# Patient Record
Sex: Male | Born: 1935
Health system: Southern US, Community
[De-identification: ages and names within clinical notes are randomized; demographics above are authoritative.]

## PROBLEM LIST (undated history)

## (undated) DIAGNOSIS — I739 Peripheral vascular disease, unspecified: Secondary | ICD-10-CM

## (undated) DIAGNOSIS — E079 Disorder of thyroid, unspecified: Secondary | ICD-10-CM

## (undated) DIAGNOSIS — K219 Gastro-esophageal reflux disease without esophagitis: Secondary | ICD-10-CM

## (undated) DIAGNOSIS — Z8619 Personal history of other infectious and parasitic diseases: Secondary | ICD-10-CM

## (undated) DIAGNOSIS — I509 Heart failure, unspecified: Secondary | ICD-10-CM

## (undated) DIAGNOSIS — Z7901 Long term (current) use of anticoagulants: Secondary | ICD-10-CM

## (undated) DIAGNOSIS — I2129 ST elevation (STEMI) myocardial infarction involving other sites: Secondary | ICD-10-CM

## (undated) DIAGNOSIS — I2781 Cor pulmonale (chronic): Secondary | ICD-10-CM

## (undated) DIAGNOSIS — I4891 Unspecified atrial fibrillation: Secondary | ICD-10-CM

## (undated) DIAGNOSIS — E538 Deficiency of other specified B group vitamins: Secondary | ICD-10-CM

## (undated) DIAGNOSIS — I272 Pulmonary hypertension, unspecified: Secondary | ICD-10-CM

## (undated) DIAGNOSIS — I2510 Atherosclerotic heart disease of native coronary artery without angina pectoris: Secondary | ICD-10-CM

## (undated) DIAGNOSIS — E039 Hypothyroidism, unspecified: Secondary | ICD-10-CM

## (undated) DIAGNOSIS — D509 Iron deficiency anemia, unspecified: Secondary | ICD-10-CM

## (undated) DIAGNOSIS — I700 Atherosclerosis of aorta: Secondary | ICD-10-CM

## (undated) DIAGNOSIS — E785 Hyperlipidemia, unspecified: Secondary | ICD-10-CM

## (undated) DIAGNOSIS — Z8489 Family history of other specified conditions: Secondary | ICD-10-CM

## (undated) HISTORY — DX: Personal history of other infectious and parasitic diseases: Z86.19

## (undated) HISTORY — PX: COLONOSCOPY: SHX174

## (undated) HISTORY — PX: CARDIAC SURGERY: SHX584

## (undated) HISTORY — PX: HERNIA REPAIR: SHX51

---

## 1993-02-21 DIAGNOSIS — I2510 Atherosclerotic heart disease of native coronary artery without angina pectoris: Secondary | ICD-10-CM

## 1993-02-21 DIAGNOSIS — I2129 ST elevation (STEMI) myocardial infarction involving other sites: Secondary | ICD-10-CM

## 1993-02-21 HISTORY — DX: Atherosclerotic heart disease of native coronary artery without angina pectoris: I25.10

## 1993-02-21 HISTORY — DX: ST elevation (STEMI) myocardial infarction involving other sites: I21.29

## 1993-02-26 DIAGNOSIS — Z951 Presence of aortocoronary bypass graft: Secondary | ICD-10-CM

## 1993-02-26 HISTORY — DX: Presence of aortocoronary bypass graft: Z95.1

## 1993-02-26 HISTORY — PX: CORONARY ARTERY BYPASS GRAFT: SHX141

## 2003-12-30 ENCOUNTER — Other Ambulatory Visit: Payer: Self-pay

## 2006-08-19 ENCOUNTER — Ambulatory Visit: Payer: Self-pay | Admitting: Gastroenterology

## 2006-09-22 ENCOUNTER — Emergency Department: Payer: Self-pay | Admitting: Emergency Medicine

## 2006-10-11 ENCOUNTER — Other Ambulatory Visit: Payer: Self-pay

## 2006-10-11 ENCOUNTER — Inpatient Hospital Stay: Payer: Self-pay

## 2007-07-22 ENCOUNTER — Other Ambulatory Visit: Payer: Self-pay

## 2007-07-22 ENCOUNTER — Emergency Department: Payer: Self-pay

## 2008-09-02 ENCOUNTER — Ambulatory Visit: Payer: Self-pay

## 2008-09-28 ENCOUNTER — Ambulatory Visit: Payer: Self-pay | Admitting: Unknown Physician Specialty

## 2008-10-03 ENCOUNTER — Ambulatory Visit: Payer: Self-pay | Admitting: Unknown Physician Specialty

## 2009-06-20 ENCOUNTER — Ambulatory Visit: Payer: Self-pay | Admitting: Unknown Physician Specialty

## 2013-11-06 ENCOUNTER — Emergency Department: Payer: Self-pay | Admitting: Emergency Medicine

## 2013-11-06 LAB — COMPREHENSIVE METABOLIC PANEL
Anion Gap: 6 — ABNORMAL LOW (ref 7–16)
BUN: 24 mg/dL — ABNORMAL HIGH (ref 7–18)
Bilirubin,Total: 1.2 mg/dL — ABNORMAL HIGH (ref 0.2–1.0)
Creatinine: 1.07 mg/dL (ref 0.60–1.30)
EGFR (African American): >60
EGFR (Non-African Amer.): >60
Glucose: 113 mg/dL — ABNORMAL HIGH (ref 65–99)
Osmolality: 279 (ref 275–301)
Potassium: 3.9 mmol/L (ref 3.5–5.1)
Sodium: 137 mmol/L (ref 136–145)

## 2013-11-06 LAB — CBC
HGB: 13.1 g/dL (ref 13.0–18.0)
MCH: 36.8 pg — ABNORMAL HIGH (ref 26.0–34.0)
MCHC: 35.3 g/dL (ref 32.0–36.0)
MCV: 104 fL — ABNORMAL HIGH (ref 80–100)
RBC: 3.55 10*6/uL — ABNORMAL LOW (ref 4.40–5.90)
RDW: 19.2 % — ABNORMAL HIGH (ref 11.5–14.5)

## 2013-11-06 LAB — PROTIME-INR
INR: 1.8
Prothrombin Time: 20.8 secs — ABNORMAL HIGH (ref 11.5–14.7)

## 2013-11-06 LAB — URINALYSIS, COMPLETE
Bilirubin,UR: NEGATIVE
Nitrite: NEGATIVE
Ph: 5 (ref 4.5–8.0)
RBC,UR: 1 /HPF (ref 0–5)

## 2014-04-25 DIAGNOSIS — I10 Essential (primary) hypertension: Secondary | ICD-10-CM | POA: Insufficient documentation

## 2014-04-25 DIAGNOSIS — K219 Gastro-esophageal reflux disease without esophagitis: Secondary | ICD-10-CM | POA: Insufficient documentation

## 2014-04-25 DIAGNOSIS — I2510 Atherosclerotic heart disease of native coronary artery without angina pectoris: Secondary | ICD-10-CM | POA: Insufficient documentation

## 2014-04-25 HISTORY — DX: Essential (primary) hypertension: I10

## 2014-05-25 ENCOUNTER — Encounter: Payer: Self-pay | Admitting: Podiatrist

## 2014-05-25 ENCOUNTER — Ambulatory Visit (INDEPENDENT_AMBULATORY_CARE_PROVIDER_SITE_OTHER): Payer: Commercial Managed Care - HMO | Admitting: Podiatrist

## 2014-05-25 ENCOUNTER — Ambulatory Visit (INDEPENDENT_AMBULATORY_CARE_PROVIDER_SITE_OTHER): Payer: Commercial Managed Care - HMO

## 2014-05-25 VITALS — BP 123/73 | HR 68 | Resp 16 | Ht 68.0 in | Wt 175.0 lb

## 2014-05-25 DIAGNOSIS — M2040 Other hammer toe(s) (acquired), unspecified foot: Secondary | ICD-10-CM

## 2014-05-25 DIAGNOSIS — L84 Corns and callosities: Secondary | ICD-10-CM

## 2014-05-25 NOTE — Patient Instructions (Signed)

## 2014-05-25 NOTE — Progress Notes (Signed)
   Subjective:    Patient ID: [name] [name], male    DOB: 1929-02-07, 80 y.o.   MRN: [id number]  HPI Comments: i have a corn on my 4 and 5th toes on my left foot. They hurt sometimes. i have had them for 1 - 2 yrs. They are getting worse. My sister in law trims on my corns. i will use cream on them.  Foot Pain      Review of Systems  Hematological: Bruises/bleeds easily.       Slow to heal  All other systems reviewed and are negative.      Objective:   Physical Exam Patient is awake, alert, and oriented x 3.  In no acute distress.  Vascular status is intact with palpable pedal pulses at 2/4 DP and PT bilateral and capillary refill time within normal limits, multiple varicosities noted to bilateral feet,. Neurological sensation is also intact bilaterally via Semmes Weinstein monofilament at 5/5 sites. Light touch, vibratory sensation, Achilles tendon reflex is intact..  Musculature intact with dorsiflexion, plantarflexion, inversion, eversion. Contracture of lesser digits is noted bilateral. Hallux abductovalgus deformity left and right. Adductovarus deformity of digits 4 and 5 left also present.  He has intractable porokeratotic lesions on the medial aspect of the left fifth digital nail and medial aspect of the left fourth toe no sign of infection is present. Ground glass appearance is noted with deeply enucleated lesions. No other areas of concern dermatologically are noted.     Assessment & Plan:  Hammertoe with corn x2 left foot  Plan: Thorough debridement of the corns was carried out today without complication and without anesthesia. We discussed that if they come back in the future we can consider excision of the lesions. He will call of any problems or concerns arise. He was also dispensed padding and given instructions for its use.

## 2014-10-27 DIAGNOSIS — E538 Deficiency of other specified B group vitamins: Secondary | ICD-10-CM | POA: Insufficient documentation

## 2015-01-16 DIAGNOSIS — L03113 Cellulitis of right upper limb: Secondary | ICD-10-CM | POA: Diagnosis not present

## 2015-01-16 DIAGNOSIS — I4891 Unspecified atrial fibrillation: Secondary | ICD-10-CM | POA: Diagnosis not present

## 2015-01-16 DIAGNOSIS — M7021 Olecranon bursitis, right elbow: Secondary | ICD-10-CM | POA: Diagnosis not present

## 2015-01-25 DIAGNOSIS — M71021 Abscess of bursa, right elbow: Secondary | ICD-10-CM | POA: Diagnosis not present

## 2015-01-25 DIAGNOSIS — L03113 Cellulitis of right upper limb: Secondary | ICD-10-CM | POA: Diagnosis not present

## 2015-02-04 ENCOUNTER — Emergency Department: Payer: Self-pay | Admitting: Emergency Medicine

## 2015-02-04 DIAGNOSIS — I10 Essential (primary) hypertension: Secondary | ICD-10-CM | POA: Diagnosis not present

## 2015-02-04 DIAGNOSIS — M7021 Olecranon bursitis, right elbow: Secondary | ICD-10-CM | POA: Diagnosis not present

## 2015-02-04 LAB — COMPREHENSIVE METABOLIC PANEL
ANION GAP: 5 — AB (ref 7–16)
Albumin: 3.5 g/dL (ref 3.4–5.0)
Alkaline Phosphatase: 79 U/L (ref 46–116)
BILIRUBIN TOTAL: 0.6 mg/dL (ref 0.2–1.0)
BUN: 19 mg/dL — ABNORMAL HIGH (ref 7–18)
CHLORIDE: 106 mmol/L (ref 98–107)
CREATININE: 1.26 mg/dL (ref 0.60–1.30)
Calcium, Total: 8.5 mg/dL (ref 8.5–10.1)
Co2: 30 mmol/L (ref 21–32)
GFR CALC NON AF AMER: 58 — AB
Glucose: 122 mg/dL — ABNORMAL HIGH (ref 65–99)
Osmolality: 285 (ref 275–301)
POTASSIUM: 4.2 mmol/L (ref 3.5–5.1)
SGOT(AST): 28 U/L (ref 15–37)
SGPT (ALT): 20 U/L (ref 14–63)
Sodium: 141 mmol/L (ref 136–145)
Total Protein: 7.2 g/dL (ref 6.4–8.2)

## 2015-02-04 LAB — PROTIME-INR
INR: 1.4
PROTHROMBIN TIME: 17.3 s — AB

## 2015-02-04 LAB — CBC
HCT: 35.7 % — ABNORMAL LOW (ref 40.0–52.0)
HGB: 11.8 g/dL — ABNORMAL LOW (ref 13.0–18.0)
MCH: 35.8 pg — ABNORMAL HIGH (ref 26.0–34.0)
MCHC: 33.1 g/dL (ref 32.0–36.0)
MCV: 108 fL — AB (ref 80–100)
Platelet: 167 10*3/uL (ref 150–440)
RBC: 3.3 10*6/uL — ABNORMAL LOW (ref 4.40–5.90)
RDW: 20 % — AB (ref 11.5–14.5)
WBC: 9.7 10*3/uL (ref 3.8–10.6)

## 2015-02-05 DIAGNOSIS — B9689 Other specified bacterial agents as the cause of diseases classified elsewhere: Secondary | ICD-10-CM | POA: Diagnosis not present

## 2015-02-05 DIAGNOSIS — M7021 Olecranon bursitis, right elbow: Secondary | ICD-10-CM | POA: Diagnosis not present

## 2015-02-05 DIAGNOSIS — M25521 Pain in right elbow: Secondary | ICD-10-CM | POA: Diagnosis not present

## 2015-02-06 ENCOUNTER — Ambulatory Visit: Payer: Self-pay | Admitting: Surgery

## 2015-02-06 DIAGNOSIS — M7022 Olecranon bursitis, left elbow: Secondary | ICD-10-CM | POA: Diagnosis not present

## 2015-02-06 DIAGNOSIS — I2510 Atherosclerotic heart disease of native coronary artery without angina pectoris: Secondary | ICD-10-CM | POA: Diagnosis not present

## 2015-02-06 DIAGNOSIS — M7021 Olecranon bursitis, right elbow: Secondary | ICD-10-CM | POA: Diagnosis not present

## 2015-02-06 DIAGNOSIS — B9689 Other specified bacterial agents as the cause of diseases classified elsewhere: Secondary | ICD-10-CM | POA: Diagnosis not present

## 2015-02-06 DIAGNOSIS — I10 Essential (primary) hypertension: Secondary | ICD-10-CM | POA: Diagnosis not present

## 2015-02-06 DIAGNOSIS — K219 Gastro-esophageal reflux disease without esophagitis: Secondary | ICD-10-CM | POA: Diagnosis not present

## 2015-02-06 DIAGNOSIS — I480 Paroxysmal atrial fibrillation: Secondary | ICD-10-CM | POA: Diagnosis not present

## 2015-02-06 DIAGNOSIS — Z885 Allergy status to narcotic agent status: Secondary | ICD-10-CM | POA: Diagnosis not present

## 2015-02-06 DIAGNOSIS — M00821 Arthritis due to other bacteria, right elbow: Secondary | ICD-10-CM | POA: Diagnosis not present

## 2015-02-20 DIAGNOSIS — I4891 Unspecified atrial fibrillation: Secondary | ICD-10-CM | POA: Diagnosis not present

## 2015-03-21 DIAGNOSIS — I4891 Unspecified atrial fibrillation: Secondary | ICD-10-CM | POA: Diagnosis not present

## 2015-04-18 DIAGNOSIS — I4891 Unspecified atrial fibrillation: Secondary | ICD-10-CM | POA: Diagnosis not present

## 2015-04-24 DIAGNOSIS — E039 Hypothyroidism, unspecified: Secondary | ICD-10-CM | POA: Diagnosis not present

## 2015-04-24 DIAGNOSIS — I482 Chronic atrial fibrillation: Secondary | ICD-10-CM | POA: Diagnosis not present

## 2015-04-24 DIAGNOSIS — E119 Type 2 diabetes mellitus without complications: Secondary | ICD-10-CM | POA: Diagnosis not present

## 2015-04-24 DIAGNOSIS — E538 Deficiency of other specified B group vitamins: Secondary | ICD-10-CM | POA: Diagnosis not present

## 2015-04-24 DIAGNOSIS — I25810 Atherosclerosis of coronary artery bypass graft(s) without angina pectoris: Secondary | ICD-10-CM | POA: Diagnosis not present

## 2015-04-29 NOTE — Op Note (Signed)
PATIENT NAME:  [name] [name], [name] [name] MR#:  [id number] DATE OF BIRTH:  [birth year]  DATE OF PROCEDURE:  02/06/2015  PREOPERATIVE DIAGNOSIS: Septic olecranon bursa, right elbow.   POSTOPERATIVE DIAGNOSIS: Septic olecranon bursa, right elbow.   PROCEDURE: Excision of septic olecranon bursa, right elbow.   SURGEON: Pascal Lux, MD   ANESTHESIA: General LMA.   FINDINGS: As noted above.   COMPLICATIONS: None.   ESTIMATED BLOOD LOSS: Minimal.   TOTAL FLUIDS: 600 mL of crystalloid.   TOURNIQUET TIME: 28 minutes at 250 mmHg.   DRAINS: Penrose x 1.  CLOSURE: 4-0 Prolene interrupted sutures.   BRIEF CLINICAL NOTE: The patient is an 82 year old male who sustained an injury to his right elbow approximately 2 months ago when he struck the back of it against a steel fence while pulling roots. Since that time, he has noted intermittent swelling in the elbow, for which he has seen his primary care provider. The primary care provider has drained it on several occasions. Recently, he noted increased swelling and redness. The primary care physician attempted to aspirate it without success. He presents at this time for definitive management of his swollen and erythematous right elbow.   PROCEDURE IN DETAIL: The patient was brought into the operating room and lain in the supine position. After adequate general laryngeal mask anesthesia was obtained, the patient's right upper extremity was prepped with ChloraPrep solution before being draped sterilely. Antibiotics were administered after cultures were obtained, intraoperatively. The arm was kept elevated for several minutes before the tourniquet was inflated to 250 mmHg. A curvilinear incision was made around the posterolateral aspect of the bursa and carried down to the subcutaneous tissues to enter the bursa. A culture was obtained. The bursa was ellipsed in its entirety using a #15-blade and rongeurs. Once the bursa was excised completely, the wound was  copiously irrigated with bacitracin saline solution before a 1/4-inch Penrose drain was placed. The subcutaneous tissues were closed using 2-0 Vicryl interrupted sutures before the skin was closed using 4-0 Prolene interrupted sutures. Care was taken to avoid suturing in the drain. A total of 10 mL of 0.5% Sensorcaine plain was injected in and around the incision site to help with postoperative analgesia before a sterile bulky dressing was applied to the elbow. The patient was placed into a sling before he was awakened, extubated, and returned to the recovery room in satisfactory condition after tolerating the procedure well.   ____________________________ J. Dorien Chihuahua, MD jjp:mw D: 02/06/2015 15:52:02 ET T: 02/06/2015 19:54:49 ET JOB#: [id number]  cc: Pascal Lux, MD, <Dictator> Pascal Lux MD ELECTRONICALLY SIGNED 02/08/2015 11:28

## 2015-05-01 DIAGNOSIS — L570 Actinic keratosis: Secondary | ICD-10-CM | POA: Diagnosis not present

## 2015-05-01 DIAGNOSIS — E538 Deficiency of other specified B group vitamins: Secondary | ICD-10-CM | POA: Diagnosis not present

## 2015-05-01 DIAGNOSIS — E119 Type 2 diabetes mellitus without complications: Secondary | ICD-10-CM | POA: Diagnosis not present

## 2015-05-01 DIAGNOSIS — I482 Chronic atrial fibrillation: Secondary | ICD-10-CM | POA: Diagnosis not present

## 2015-05-01 DIAGNOSIS — Z0000 Encounter for general adult medical examination without abnormal findings: Secondary | ICD-10-CM | POA: Diagnosis not present

## 2015-06-04 DIAGNOSIS — I482 Chronic atrial fibrillation: Secondary | ICD-10-CM | POA: Diagnosis not present

## 2015-06-12 DIAGNOSIS — I25810 Atherosclerosis of coronary artery bypass graft(s) without angina pectoris: Secondary | ICD-10-CM | POA: Diagnosis not present

## 2015-06-12 DIAGNOSIS — I10 Essential (primary) hypertension: Secondary | ICD-10-CM | POA: Diagnosis not present

## 2015-06-12 DIAGNOSIS — I482 Chronic atrial fibrillation: Secondary | ICD-10-CM | POA: Diagnosis not present

## 2015-06-12 DIAGNOSIS — I420 Dilated cardiomyopathy: Secondary | ICD-10-CM | POA: Diagnosis not present

## 2015-06-19 DIAGNOSIS — L821 Other seborrheic keratosis: Secondary | ICD-10-CM | POA: Diagnosis not present

## 2015-06-19 DIAGNOSIS — Z85828 Personal history of other malignant neoplasm of skin: Secondary | ICD-10-CM | POA: Diagnosis not present

## 2015-06-19 DIAGNOSIS — X32XXXA Exposure to sunlight, initial encounter: Secondary | ICD-10-CM | POA: Diagnosis not present

## 2015-06-19 DIAGNOSIS — L570 Actinic keratosis: Secondary | ICD-10-CM | POA: Diagnosis not present

## 2015-06-19 DIAGNOSIS — Z85820 Personal history of malignant melanoma of skin: Secondary | ICD-10-CM | POA: Diagnosis not present

## 2015-07-09 DIAGNOSIS — I482 Chronic atrial fibrillation: Secondary | ICD-10-CM | POA: Diagnosis not present

## 2015-07-31 DIAGNOSIS — L570 Actinic keratosis: Secondary | ICD-10-CM | POA: Diagnosis not present

## 2015-07-31 DIAGNOSIS — X32XXXA Exposure to sunlight, initial encounter: Secondary | ICD-10-CM | POA: Diagnosis not present

## 2015-08-06 DIAGNOSIS — I482 Chronic atrial fibrillation: Secondary | ICD-10-CM | POA: Diagnosis not present

## 2015-09-04 DIAGNOSIS — L570 Actinic keratosis: Secondary | ICD-10-CM | POA: Diagnosis not present

## 2015-09-04 DIAGNOSIS — X32XXXA Exposure to sunlight, initial encounter: Secondary | ICD-10-CM | POA: Diagnosis not present

## 2015-09-10 DIAGNOSIS — I482 Chronic atrial fibrillation: Secondary | ICD-10-CM | POA: Diagnosis not present

## 2015-10-08 DIAGNOSIS — I482 Chronic atrial fibrillation: Secondary | ICD-10-CM | POA: Diagnosis not present

## 2015-10-25 DIAGNOSIS — Z0000 Encounter for general adult medical examination without abnormal findings: Secondary | ICD-10-CM | POA: Diagnosis not present

## 2015-10-25 DIAGNOSIS — I482 Chronic atrial fibrillation: Secondary | ICD-10-CM | POA: Diagnosis not present

## 2015-10-25 DIAGNOSIS — E538 Deficiency of other specified B group vitamins: Secondary | ICD-10-CM | POA: Diagnosis not present

## 2015-10-25 DIAGNOSIS — E119 Type 2 diabetes mellitus without complications: Secondary | ICD-10-CM | POA: Diagnosis not present

## 2015-11-01 DIAGNOSIS — E7800 Pure hypercholesterolemia, unspecified: Secondary | ICD-10-CM | POA: Diagnosis not present

## 2015-11-01 DIAGNOSIS — I480 Paroxysmal atrial fibrillation: Secondary | ICD-10-CM | POA: Diagnosis not present

## 2015-11-01 DIAGNOSIS — E538 Deficiency of other specified B group vitamins: Secondary | ICD-10-CM | POA: Diagnosis not present

## 2015-11-01 DIAGNOSIS — E119 Type 2 diabetes mellitus without complications: Secondary | ICD-10-CM | POA: Diagnosis not present

## 2015-11-05 DIAGNOSIS — I482 Chronic atrial fibrillation: Secondary | ICD-10-CM | POA: Diagnosis not present

## 2015-11-06 DIAGNOSIS — J0110 Acute frontal sinusitis, unspecified: Secondary | ICD-10-CM | POA: Diagnosis not present

## 2015-11-06 DIAGNOSIS — L2084 Intrinsic (allergic) eczema: Secondary | ICD-10-CM | POA: Diagnosis not present

## 2015-11-19 DIAGNOSIS — I8311 Varicose veins of right lower extremity with inflammation: Secondary | ICD-10-CM | POA: Diagnosis not present

## 2015-11-19 DIAGNOSIS — L308 Other specified dermatitis: Secondary | ICD-10-CM | POA: Diagnosis not present

## 2015-11-19 DIAGNOSIS — I8312 Varicose veins of left lower extremity with inflammation: Secondary | ICD-10-CM | POA: Diagnosis not present

## 2015-12-03 DIAGNOSIS — I482 Chronic atrial fibrillation: Secondary | ICD-10-CM | POA: Diagnosis not present

## 2015-12-13 DIAGNOSIS — I10 Essential (primary) hypertension: Secondary | ICD-10-CM | POA: Diagnosis not present

## 2015-12-13 DIAGNOSIS — E119 Type 2 diabetes mellitus without complications: Secondary | ICD-10-CM | POA: Diagnosis not present

## 2015-12-13 DIAGNOSIS — I482 Chronic atrial fibrillation: Secondary | ICD-10-CM | POA: Diagnosis not present

## 2015-12-13 DIAGNOSIS — Z951 Presence of aortocoronary bypass graft: Secondary | ICD-10-CM | POA: Diagnosis not present

## 2015-12-13 DIAGNOSIS — I25810 Atherosclerosis of coronary artery bypass graft(s) without angina pectoris: Secondary | ICD-10-CM | POA: Diagnosis not present

## 2015-12-13 DIAGNOSIS — I420 Dilated cardiomyopathy: Secondary | ICD-10-CM | POA: Diagnosis not present

## 2016-01-01 DIAGNOSIS — I482 Chronic atrial fibrillation: Secondary | ICD-10-CM | POA: Diagnosis not present

## 2016-01-29 DIAGNOSIS — I482 Chronic atrial fibrillation: Secondary | ICD-10-CM | POA: Diagnosis not present

## 2016-02-26 DIAGNOSIS — I482 Chronic atrial fibrillation: Secondary | ICD-10-CM | POA: Diagnosis not present

## 2016-03-25 DIAGNOSIS — I482 Chronic atrial fibrillation: Secondary | ICD-10-CM | POA: Diagnosis not present

## 2016-04-24 DIAGNOSIS — E119 Type 2 diabetes mellitus without complications: Secondary | ICD-10-CM | POA: Diagnosis not present

## 2016-04-24 DIAGNOSIS — E538 Deficiency of other specified B group vitamins: Secondary | ICD-10-CM | POA: Diagnosis not present

## 2016-04-24 DIAGNOSIS — E7800 Pure hypercholesterolemia, unspecified: Secondary | ICD-10-CM | POA: Diagnosis not present

## 2016-05-01 DIAGNOSIS — E119 Type 2 diabetes mellitus without complications: Secondary | ICD-10-CM | POA: Diagnosis not present

## 2016-05-01 DIAGNOSIS — I482 Chronic atrial fibrillation: Secondary | ICD-10-CM | POA: Diagnosis not present

## 2016-05-01 DIAGNOSIS — E538 Deficiency of other specified B group vitamins: Secondary | ICD-10-CM | POA: Diagnosis not present

## 2016-05-01 DIAGNOSIS — Z0000 Encounter for general adult medical examination without abnormal findings: Secondary | ICD-10-CM | POA: Diagnosis not present

## 2016-05-01 DIAGNOSIS — I420 Dilated cardiomyopathy: Secondary | ICD-10-CM | POA: Diagnosis not present

## 2016-06-05 DIAGNOSIS — I480 Paroxysmal atrial fibrillation: Secondary | ICD-10-CM | POA: Diagnosis not present

## 2016-06-27 DIAGNOSIS — Z951 Presence of aortocoronary bypass graft: Secondary | ICD-10-CM | POA: Diagnosis not present

## 2016-06-27 DIAGNOSIS — I2510 Atherosclerotic heart disease of native coronary artery without angina pectoris: Secondary | ICD-10-CM | POA: Diagnosis not present

## 2016-06-27 DIAGNOSIS — I10 Essential (primary) hypertension: Secondary | ICD-10-CM | POA: Diagnosis not present

## 2016-06-27 DIAGNOSIS — I480 Paroxysmal atrial fibrillation: Secondary | ICD-10-CM | POA: Diagnosis not present

## 2016-06-27 DIAGNOSIS — E119 Type 2 diabetes mellitus without complications: Secondary | ICD-10-CM | POA: Diagnosis not present

## 2016-06-27 DIAGNOSIS — I420 Dilated cardiomyopathy: Secondary | ICD-10-CM | POA: Diagnosis not present

## 2016-07-02 DIAGNOSIS — I8312 Varicose veins of left lower extremity with inflammation: Secondary | ICD-10-CM | POA: Diagnosis not present

## 2016-07-02 DIAGNOSIS — Z85820 Personal history of malignant melanoma of skin: Secondary | ICD-10-CM | POA: Diagnosis not present

## 2016-07-02 DIAGNOSIS — L570 Actinic keratosis: Secondary | ICD-10-CM | POA: Diagnosis not present

## 2016-07-02 DIAGNOSIS — Z85828 Personal history of other malignant neoplasm of skin: Secondary | ICD-10-CM | POA: Diagnosis not present

## 2016-07-02 DIAGNOSIS — I8311 Varicose veins of right lower extremity with inflammation: Secondary | ICD-10-CM | POA: Diagnosis not present

## 2016-07-02 DIAGNOSIS — X32XXXA Exposure to sunlight, initial encounter: Secondary | ICD-10-CM | POA: Diagnosis not present

## 2016-07-03 DIAGNOSIS — I480 Paroxysmal atrial fibrillation: Secondary | ICD-10-CM | POA: Diagnosis not present

## 2016-07-09 DIAGNOSIS — M3501 Sicca syndrome with keratoconjunctivitis: Secondary | ICD-10-CM | POA: Diagnosis not present

## 2016-07-31 DIAGNOSIS — I482 Chronic atrial fibrillation: Secondary | ICD-10-CM | POA: Diagnosis not present

## 2016-08-28 DIAGNOSIS — I482 Chronic atrial fibrillation: Secondary | ICD-10-CM | POA: Diagnosis not present

## 2016-09-25 DIAGNOSIS — I480 Paroxysmal atrial fibrillation: Secondary | ICD-10-CM | POA: Diagnosis not present

## 2016-10-27 DIAGNOSIS — I480 Paroxysmal atrial fibrillation: Secondary | ICD-10-CM | POA: Diagnosis not present

## 2016-10-27 DIAGNOSIS — E119 Type 2 diabetes mellitus without complications: Secondary | ICD-10-CM | POA: Diagnosis not present

## 2016-10-27 DIAGNOSIS — E538 Deficiency of other specified B group vitamins: Secondary | ICD-10-CM | POA: Diagnosis not present

## 2016-10-27 DIAGNOSIS — Z0000 Encounter for general adult medical examination without abnormal findings: Secondary | ICD-10-CM | POA: Diagnosis not present

## 2016-11-03 DIAGNOSIS — E119 Type 2 diabetes mellitus without complications: Secondary | ICD-10-CM | POA: Diagnosis not present

## 2016-11-03 DIAGNOSIS — E538 Deficiency of other specified B group vitamins: Secondary | ICD-10-CM | POA: Diagnosis not present

## 2016-11-03 DIAGNOSIS — I482 Chronic atrial fibrillation: Secondary | ICD-10-CM | POA: Diagnosis not present

## 2016-11-03 DIAGNOSIS — E039 Hypothyroidism, unspecified: Secondary | ICD-10-CM | POA: Insufficient documentation

## 2016-11-03 DIAGNOSIS — Z23 Encounter for immunization: Secondary | ICD-10-CM | POA: Diagnosis not present

## 2016-11-24 DIAGNOSIS — I480 Paroxysmal atrial fibrillation: Secondary | ICD-10-CM | POA: Diagnosis not present

## 2017-01-05 DIAGNOSIS — I482 Chronic atrial fibrillation: Secondary | ICD-10-CM | POA: Diagnosis not present

## 2017-01-27 DIAGNOSIS — I482 Chronic atrial fibrillation: Secondary | ICD-10-CM | POA: Diagnosis not present

## 2017-01-27 DIAGNOSIS — I2510 Atherosclerotic heart disease of native coronary artery without angina pectoris: Secondary | ICD-10-CM | POA: Diagnosis not present

## 2017-01-27 DIAGNOSIS — E7800 Pure hypercholesterolemia, unspecified: Secondary | ICD-10-CM | POA: Diagnosis not present

## 2017-01-27 DIAGNOSIS — I10 Essential (primary) hypertension: Secondary | ICD-10-CM | POA: Diagnosis not present

## 2017-01-27 DIAGNOSIS — Z951 Presence of aortocoronary bypass graft: Secondary | ICD-10-CM | POA: Diagnosis not present

## 2017-01-27 DIAGNOSIS — I420 Dilated cardiomyopathy: Secondary | ICD-10-CM | POA: Diagnosis not present

## 2017-01-29 DIAGNOSIS — M79672 Pain in left foot: Secondary | ICD-10-CM | POA: Diagnosis not present

## 2017-01-29 DIAGNOSIS — E119 Type 2 diabetes mellitus without complications: Secondary | ICD-10-CM | POA: Diagnosis not present

## 2017-02-02 DIAGNOSIS — I480 Paroxysmal atrial fibrillation: Secondary | ICD-10-CM | POA: Diagnosis not present

## 2017-02-17 DIAGNOSIS — M79672 Pain in left foot: Secondary | ICD-10-CM | POA: Diagnosis not present

## 2017-03-02 DIAGNOSIS — I480 Paroxysmal atrial fibrillation: Secondary | ICD-10-CM | POA: Diagnosis not present

## 2017-03-11 DIAGNOSIS — M76822 Posterior tibial tendinitis, left leg: Secondary | ICD-10-CM | POA: Diagnosis not present

## 2017-03-11 DIAGNOSIS — M79672 Pain in left foot: Secondary | ICD-10-CM | POA: Diagnosis not present

## 2017-03-11 DIAGNOSIS — S90222A Contusion of left lesser toe(s) with damage to nail, initial encounter: Secondary | ICD-10-CM | POA: Diagnosis not present

## 2017-03-25 DIAGNOSIS — S90222A Contusion of left lesser toe(s) with damage to nail, initial encounter: Secondary | ICD-10-CM | POA: Diagnosis not present

## 2017-03-25 DIAGNOSIS — M76822 Posterior tibial tendinitis, left leg: Secondary | ICD-10-CM | POA: Diagnosis not present

## 2017-03-30 DIAGNOSIS — I482 Chronic atrial fibrillation: Secondary | ICD-10-CM | POA: Diagnosis not present

## 2017-04-07 ENCOUNTER — Ambulatory Visit (INDEPENDENT_AMBULATORY_CARE_PROVIDER_SITE_OTHER): Payer: Medicare HMO | Admitting: Podiatry

## 2017-04-07 ENCOUNTER — Telehealth: Payer: Self-pay | Admitting: Podiatry

## 2017-04-07 ENCOUNTER — Ambulatory Visit (INDEPENDENT_AMBULATORY_CARE_PROVIDER_SITE_OTHER): Payer: Medicare HMO

## 2017-04-07 DIAGNOSIS — S92405A Nondisplaced unspecified fracture of left great toe, initial encounter for closed fracture: Secondary | ICD-10-CM

## 2017-04-07 DIAGNOSIS — L97522 Non-pressure chronic ulcer of other part of left foot with fat layer exposed: Secondary | ICD-10-CM | POA: Diagnosis not present

## 2017-04-07 DIAGNOSIS — I83025 Varicose veins of left lower extremity with ulcer other part of foot: Secondary | ICD-10-CM

## 2017-04-07 DIAGNOSIS — L97529 Non-pressure chronic ulcer of other part of left foot with unspecified severity: Secondary | ICD-10-CM

## 2017-04-07 DIAGNOSIS — I83892 Varicose veins of left lower extremities with other complications: Secondary | ICD-10-CM

## 2017-04-07 DIAGNOSIS — R52 Pain, unspecified: Secondary | ICD-10-CM | POA: Diagnosis not present

## 2017-04-07 MED ORDER — MUPIROCIN 2 % EX OINT: 1.0000 "application " | TOPICAL_OINTMENT | Freq: Two times a day (BID) | CUTANEOUS | 0 refills | Status: DC

## 2017-04-07 MED ORDER — MUPIROCIN CALCIUM 2 % EX CREA: 1.0000 "application " | TOPICAL_CREAM | Freq: Two times a day (BID) | CUTANEOUS | 0 refills | Status: DC

## 2017-04-07 NOTE — Telephone Encounter (Signed)
Dr. Amalia Hailey states may change to Mupirocin ointment or gentamicin cream. Orders changed to mupirocin ointment.

## 2017-04-07 NOTE — Telephone Encounter (Signed)
Yes, I originally prescribed Mupirocin cream. Please change to gentamicin cream 15g tube. It should be much cheaper. Thanks, Dr. Amalia Hailey

## 2017-04-07 NOTE — Progress Notes (Signed)
   Subjective:  Patient presents today for evaluation of an ulceration to the left foot. Patient states he dropped something on the left great toe 3 weeks ago. He states an erythematous, sore wound has appeared on the left foot. He reports sharp, shooting pain as well. Patient has been caring for the wound at home by applying Neosporin which has increased his pain. Patient presents today for further treatment and evaluation of the ulceration site(s).    Objective/Physical Exam General: The patient is alert and oriented x3 in no acute distress.  Dermatology:  Wound #1 noted to the left medial foot 3.0 x 2.0 x 0.1 cm (LxWxD).   To the noted ulceration(s), there is no eschar. There is a moderate amount of slough, fibrin, and necrotic tissue noted. Granulation tissue and wound base is red. There is a minimal amount of serosanguineous drainage noted. There is no exposed bone muscle-tendon ligament or joint. There is no malodor. Periwound integrity is intact. Skin is warm, dry and supple bilateral lower extremities.  Vascular: Palpable pedal pulses bilaterally. Mild edema noted. Capillary refill within normal limits. Varicosities noted bilateral lower extremities.   Neurological: Epicritic and protective threshold absent bilaterally.   Musculoskeletal Exam: Pain with palpation to the left great toe. Range of motion within normal limits to all pedal and ankle joints bilateral. Muscle strength 5/5 in all groups bilateral.   Assessment: #1 Venous ulcer  to the left medial foot secondary to venous insufficiency 3.0 x 2.0 x 0.1 cm #2 varicosities bilateral lower extremities #3 closed, nondisplaced left great toe fracture  Plan of Care:  #1 Patient was evaluated. #2 medically necessary excisional debridement including subcutaneous tissue was performed using a tissue nipper and a chisel blade. Excisional debridement of all the necrotic nonviable tissue down to healthy bleeding viable tissue was  performed with post-debridement measurements same as pre-. #3 the wound was cleansed with normal saline. #4 collagen dressing applied directly to wound followed by dry sterile dressings.  #5 prescription for mupirocin cream #6 applied multilayer compression wrap to the left lower extremity #7 patient is to return to clinic in 2 weeks.   Edrick Kins, DPM [facility]  Dr. Edrick Kins, [address]                                        [place], [place] [address]                Office [phone]  Fax [phone]

## 2017-04-07 NOTE — Telephone Encounter (Signed)
Pharmacy called asking if they could change the rx that was sent over this morning. The one you ordered would be about 200 dollar copay for pt.

## 2017-04-10 ENCOUNTER — Telehealth: Payer: Self-pay | Admitting: Podiatry

## 2017-04-10 NOTE — Telephone Encounter (Signed)
Pt called and medication is not working and pt wants to discuss a possible it with you. Pt wanting an antibiotic

## 2017-04-13 MED ORDER — DOXYCYCLINE HYCLATE 100 MG PO CAPS: 100.0000 mg | ORAL_CAPSULE | Freq: Two times a day (BID) | ORAL | 0 refills | Status: DC

## 2017-04-13 NOTE — Telephone Encounter (Signed)
I informed pt of Dr. Amalia Hailey orders for Doxycylcline and that if worsened needs to come in earlier, and keep scheduled appt.

## 2017-04-13 NOTE — Telephone Encounter (Signed)
If patient wants an antibiotic, just go ahead and prescribe Doxycycline 100mg  #14 no refills.  Thanks, Dr. Amalia Hailey

## 2017-04-21 ENCOUNTER — Telehealth: Payer: Self-pay | Admitting: *Deleted

## 2017-04-21 ENCOUNTER — Ambulatory Visit (INDEPENDENT_AMBULATORY_CARE_PROVIDER_SITE_OTHER): Payer: Medicare HMO | Admitting: Podiatry

## 2017-04-21 ENCOUNTER — Ambulatory Visit (INDEPENDENT_AMBULATORY_CARE_PROVIDER_SITE_OTHER): Payer: Medicare HMO

## 2017-04-21 DIAGNOSIS — I83025 Varicose veins of left lower extremity with ulcer other part of foot: Secondary | ICD-10-CM | POA: Diagnosis not present

## 2017-04-21 DIAGNOSIS — L97529 Non-pressure chronic ulcer of other part of left foot with unspecified severity: Secondary | ICD-10-CM

## 2017-04-21 DIAGNOSIS — I872 Venous insufficiency (chronic) (peripheral): Secondary | ICD-10-CM

## 2017-04-21 DIAGNOSIS — L97522 Non-pressure chronic ulcer of other part of left foot with fat layer exposed: Secondary | ICD-10-CM | POA: Diagnosis not present

## 2017-04-21 DIAGNOSIS — S92405A Nondisplaced unspecified fracture of left great toe, initial encounter for closed fracture: Secondary | ICD-10-CM

## 2017-04-21 MED ORDER — CLOBETASOL PROPIONATE 0.05 % EX OINT: 1.0000 "application " | TOPICAL_OINTMENT | Freq: Two times a day (BID) | CUTANEOUS | 0 refills | Status: DC

## 2017-04-21 MED ORDER — TRIAMCINOLONE ACETONIDE 0.5 % EX CREA: 1.0000 "application " | TOPICAL_CREAM | Freq: Three times a day (TID) | CUTANEOUS | 1 refills | Status: DC

## 2017-04-21 NOTE — Telephone Encounter (Addendum)
-----   Message from Edrick Kins, DPM sent at 04/21/2017  8:54 AM EDT ----- Regarding: Referral for vascular consult Please refer for vascular consult.  Dx: venous stasis dermatitis. Venous insufficiency.   Note dictated. Thanks, Dr. Amalia Hailey. Faxed referral, clinicals and demographics to [facility].

## 2017-04-21 NOTE — Addendum Note (Signed)
Addended by: Graceann Congress D on: 04/21/2017 10:57 AM   Modules accepted: Orders

## 2017-04-21 NOTE — Progress Notes (Signed)
   Subjective:  Patient presents today for follow up evaluation of an ulceration to the left foot. He also presents for follow-up of a fracture to the left great toe distal phalanx. Patient denies pain to the great toe, however has significant pain relating to the venous ulcer, especially at night.    Objective/Physical Exam General: The patient is alert and oriented x3 in no acute distress.  Dermatology:  Wound #1 noted to the left medial foot 0.5x0.5 x 0.1 cm (LxWxD).   To the noted ulceration(s), there is no eschar. There is a moderate amount of slough, fibrin, and necrotic tissue noted. Granulation tissue and wound base is red. There is a minimal amount of serosanguineous drainage noted. There is no exposed bone muscle-tendon ligament or joint. There is no malodor. Periwound integrity is intact. Skin is warm, dry and supple bilateral lower extremities.  Vascular: Palpable pedal pulses bilaterally. Mild edema noted. Capillary refill within normal limits. Varicosities noted bilateral lower extremities. Venous stasis dermatitis also noted localized around the ulceration site.   Neurological: Epicritic and protective threshold diminished bilaterally.   Musculoskeletal Exam: Pain with palpation to the left great toe. Range of motion within normal limits to all pedal and ankle joints bilateral. Muscle strength 5/5 in all groups bilateral.   Assessment: #1 Venous ulcer  to the left medial foot secondary to venous insufficiency measuring 0.5 x 0.5 x 0.1 cm #2 varicosities bilateral lower extremities #3 venous stasis dermatitis left foot #4 closed, nondisplaced left great toe fracture  Plan of Care:  #1 Patient was evaluated. #2 medically necessary excisional debridement including subcutaneous tissue was performed using a tissue nipper and a chisel blade. Excisional debridement of all the necrotic nonviable tissue down to healthy bleeding viable tissue was performed with post-debridement  measurements same as pre-. #3 the wound was cleansed with normal saline. #4 dry sterile dressings applied. #5 prescription for temovate cream to address venous stasis dermatitis #6 patient has compression stockings at home. Continue wearing daily.  #7 referral placed for vascular consult #8 return to clinic in 2 weeks  Edrick Kins, DPM [facility]  Dr. Edrick Kins, [address]                                        [place], [place] [address]                Office [phone]  Fax [phone]

## 2017-04-23 ENCOUNTER — Encounter (INDEPENDENT_AMBULATORY_CARE_PROVIDER_SITE_OTHER): Payer: Self-pay | Admitting: Vascular Surgery

## 2017-04-23 ENCOUNTER — Ambulatory Visit (INDEPENDENT_AMBULATORY_CARE_PROVIDER_SITE_OTHER): Payer: Medicare HMO | Admitting: Vascular Surgery

## 2017-04-23 VITALS — BP 133/81 | HR 69 | Resp 16 | Ht 68.0 in | Wt 175.0 lb

## 2017-04-23 DIAGNOSIS — I4891 Unspecified atrial fibrillation: Secondary | ICD-10-CM | POA: Diagnosis not present

## 2017-04-23 DIAGNOSIS — I83029 Varicose veins of left lower extremity with ulcer of unspecified site: Secondary | ICD-10-CM | POA: Diagnosis not present

## 2017-04-23 DIAGNOSIS — M79605 Pain in left leg: Secondary | ICD-10-CM | POA: Diagnosis not present

## 2017-04-23 DIAGNOSIS — L97929 Non-pressure chronic ulcer of unspecified part of left lower leg with unspecified severity: Secondary | ICD-10-CM | POA: Diagnosis not present

## 2017-04-23 DIAGNOSIS — M79606 Pain in leg, unspecified: Secondary | ICD-10-CM | POA: Insufficient documentation

## 2017-04-23 DIAGNOSIS — I739 Peripheral vascular disease, unspecified: Secondary | ICD-10-CM

## 2017-04-23 DIAGNOSIS — I872 Venous insufficiency (chronic) (peripheral): Secondary | ICD-10-CM

## 2017-04-23 DIAGNOSIS — I83009 Varicose veins of unspecified lower extremity with ulcer of unspecified site: Secondary | ICD-10-CM | POA: Insufficient documentation

## 2017-04-23 DIAGNOSIS — E782 Mixed hyperlipidemia: Secondary | ICD-10-CM | POA: Diagnosis not present

## 2017-04-23 DIAGNOSIS — E785 Hyperlipidemia, unspecified: Secondary | ICD-10-CM | POA: Insufficient documentation

## 2017-04-23 DIAGNOSIS — I890 Lymphedema, not elsewhere classified: Secondary | ICD-10-CM | POA: Insufficient documentation

## 2017-04-23 DIAGNOSIS — L97909 Non-pressure chronic ulcer of unspecified part of unspecified lower leg with unspecified severity: Secondary | ICD-10-CM

## 2017-04-23 NOTE — Progress Notes (Signed)
MRN : [id number]  [name] [name] is a 83 y.o. (1929-10-15) male who presents with chief complaint of  Chief Complaint  Patient presents with  . New Evaluation    Venous stasis  .  History of Present Illness:Patient is seen for evaluation of leg pain and swelling associated with new onset ulceration of the left foot. The patient first noticed the swelling remotely. The swelling is associated with pain and discoloration. The pain and swelling worsens with prolonged dependency and improves with elevation. The pain is unrelated to activity.  The patient notes that in the morning the legs are better but the leg symptoms worsened throughout the course of the day. The patient has also noted a progressive worsening of the discoloration in the ankle and shin area.   The patient notes that an ulcer has developed acutely without specific trauma and since it occurred it has been very slow to heal.  There is a moderate amount of drainage associated with the open area.  The wound is also very painful.  The patient denies claudication symptoms or rest pain symptoms.  The patient denies DJD and LS spine disease.  The patient has not had any past angiography, interventions or vascular surgery.  Elevation makes the leg symptoms better, dependency makes them much worse. The patient denies any recent changes in medications.  The patient has not been wearing graduated compression.  The patient denies a history of DVT or PE. There is no prior history of phlebitis. There is no history of primary lymphedema.  No history of malignancies. No history of trauma or groin or pelvic surgery. There is no history of radiation treatment to the groin or pelvis       Current Meds  Medication Sig  . ALPRAZolam (XANAX) 0.25 MG tablet   . clobetasol ointment (TEMOVATE) 2.54 % Apply 1 application topically 2 (two) times daily.  . Cyanocobalamin (VITAMIN B-12) 1000 MCG SUBL Take by mouth.  . doxycycline (VIBRAMYCIN)  100 MG capsule Take 1 capsule (100 mg total) by mouth 2 (two) times daily.  Marland Kitchen levothyroxine (SYNTHROID, LEVOTHROID) 112 MCG tablet   . metoprolol succinate (TOPROL-XL) 50 MG 24 hr tablet   . Multiple Vitamins-Minerals (MULTIVITAMIN WITH MINERALS) tablet Take by mouth.  . mupirocin ointment (BACTROBAN) 2 % Place 1 application into the nose 2 (two) times daily.  . nizatidine (AXID) 150 MG capsule Take 150 mg by mouth 2 (two) times daily.  Marland Kitchen omeprazole (PRILOSEC) 20 MG capsule Take by mouth.  . potassium chloride (K-DUR,KLOR-CON) 10 MEQ tablet Take by mouth.  . simvastatin (ZOCOR) 40 MG tablet   . triamcinolone cream (KENALOG) 0.5 % Apply 1 application topically 3 (three) times daily.  Marland Kitchen warfarin (COUMADIN) 5 MG tablet     No past medical history on file.  Past Surgical History:  Procedure Laterality Date  . CARDIAC SURGERY    . HERNIA REPAIR      Social History Social History  Substance Use Topics  . Smoking status: Never Smoker  . Smokeless tobacco: Never Used  . Alcohol use No    Family History No family history on file. No family history of bleeding/clotting disorders, porphyria or autoimmune disease   Allergies  Allergen Reactions  . Codeine Other (See Comments)    Can not sleep     REVIEW OF SYSTEMS (Negative unless checked)  Constitutional: [] Weight loss  [] Fever  [] Chills Cardiac: [] Chest pain   [] Chest pressure   [] Palpitations   [] Shortness of breath  when laying flat   [] Shortness of breath with exertion. Vascular:  [] Pain in legs with walking   [] Pain in legs at rest  [] History of DVT   [] Phlebitis   [x] Swelling in legs   [x] Varicose veins   [x] Non-healing ulcers Pulmonary:   [] Uses home oxygen   [] Productive cough   [] Hemoptysis   [] Wheeze  [] COPD   [] Asthma Neurologic:  [] Dizziness   [] Seizures   [] History of stroke   [] History of TIA  [] Aphasia   [] Vissual changes   [] Weakness or numbness in arm   [] Weakness or numbness in leg Musculoskeletal:   [] Joint  swelling   [x] Joint pain   [] Low back pain Hematologic:  [] Easy bruising  [] Easy bleeding   [] Hypercoagulable state   [] Anemic Gastrointestinal:  [] Diarrhea   [] Vomiting  [] Gastroesophageal reflux/heartburn   [] Difficulty swallowing. Genitourinary:  [] Chronic kidney disease   [] Difficult urination  [] Frequent urination   [] Blood in urine Skin:  [x] Rashes   [x] Ulcers  Psychological:  [] History of anxiety   []  History of major depression.  Physical Examination  Vitals:   04/23/17 1310  BP: 133/81  Pulse: 69  Resp: 16  Weight: 79.4 kg (175 lb)  Height: 5\' 8"  (1.727 m)   Body mass index is 26.61 kg/m. Gen: WD/WN, NAD Head: [place]/AT, No temporalis wasting.  Ear/Nose/Throat: Hearing grossly intact, nares w/o erythema or drainage, poor dentition Eyes: PER, EOMI, sclera nonicteric.  Neck: Supple, no masses.  No bruit or JVD.  Pulmonary:  Good air movement, clear to auscultation bilaterally, no use of accessory muscles.  Cardiac: RRR, normal S1, S2, no Murmurs. Vascular: 2-3+ edema of the left leg with severe venous changes of the left and right legs.  Venous ulcer noted in the foot on the left, noninfected Vessel Right Left  Radial Palpable Palpable  Ulnar Palpable Palpable  Brachial Palpable Palpable  Carotid Palpable Palpable  Femoral Palpable Palpable  Popliteal Palpable Palpable  PT Palpable Palpable  DP Palpable Palpable  Gastrointestinal: soft, non-distended. No guarding/no peritoneal signs.  Musculoskeletal: M/S 5/5 throughout.  No deformity or atrophy.  Neurologic: CN 2-12 intact. Pain and light touch intact in extremities.  Symmetrical.  Speech is fluent. Motor exam as listed above. Psychiatric: Judgment intact, Mood & affect appropriate for pt's clinical situation. Dermatologic: Venous stasis dermatitis bilaterally with ulcers present on the left.  No changes consistent with cellulitis. Lymph : No Cervical lymphadenopathy, no lichenification or skin changes of chronic  lymphedema.  CBC Lab Results  Component Value Date   WBC 9.7 02/04/2015   HGB 11.8 (L) 02/04/2015   HCT 35.7 (L) 02/04/2015   MCV 108 (H) 02/04/2015   PLT 167 02/04/2015    BMET    Component Value Date/Time   NA 141 02/04/2015 1001   K 4.2 02/04/2015 1001   CL 106 02/04/2015 1001   CO2 30 02/04/2015 1001   GLUCOSE 122 (H) 02/04/2015 1001   BUN 19 (H) 02/04/2015 1001   CREATININE 1.26 02/04/2015 1001   CALCIUM 8.5 02/04/2015 1001   GFRNONAA 58 (L) 02/04/2015 1001   GFRNONAA >60 11/06/2013 1304   GFRAA >60 02/04/2015 1001   GFRAA >60 11/06/2013 1304   CrCl cannot be calculated (Patient's most recent lab result is older than the maximum 21 days allowed.).  COAG Lab Results  Component Value Date   INR 1.4 02/04/2015   INR 1.8 11/06/2013    Radiology Dg Foot Complete Left  Result Date: 04/21/2017 Please see detailed radiograph report in office note.  Dg Foot Complete Left  Result Date: 04/07/2017 Please see detailed radiograph report in office note.   Assessment/Plan 1. Varicose veins of left lower extremity with ulcer ([place]) No surgery or intervention at this point in time.    I have had a long discussion with the patient regarding venous insufficiency and why it  causes symptoms, specifically venous ulceration . I have discussed with the patient the chronic skin changes that accompany venous insufficiency and the long term sequela such as infection and recurring  ulceration.  Patient will be placed in a left [employer] which will be changed weekly drainage permitting.  In addition, behavioral modification including several periods of elevation of the lower extremities during the day will be continued. Achieving a position with the ankles at heart level was stressed to the patient  The patient is instructed to begin routine exercise, especially walking on a daily basis  Patient should undergo duplex ultrasound of the venous system to ensure that DVT or reflux is not  present.  Following the review of the ultrasound the patient will follow up in one week to reassess the degree of swelling and the control that Unna therapy is offering.   The patient can be assessed for graduated compression stockings or wraps as well as a Lymph Pump once the ulcers are healed.  - VAS [place] LOWER EXTREMITY VENOUS REFLUX; Future  2. Chronic venous insufficiency No surgery or intervention at this point in time.    I have had a long discussion with the patient regarding venous insufficiency and why it  causes symptoms. I have discussed with the patient the chronic skin changes that accompany venous insufficiency and the long term sequela such as infection and ulceration.  Patient will begin wearing graduated compression stockings class 1 (20-30 mmHg) or compression wraps on a daily basis a prescription was given. The patient will put the stockings on first thing in the morning and removing them in the evening. The patient is instructed specifically not to sleep in the stockings.    In addition, behavioral modification including several periods of elevation of the lower extremities during the day will be continued. I have demonstrated that proper elevation is a position with the ankles at heart level.  The patient is instructed to begin routine exercise, especially walking on a daily basis  Patient has had a  duplex ultrasound of the venous system to ensure that DVT is not present.  Following the review of the ultrasound the patient will follow up in 2-3 months to reassess the degree of swelling and the control that graduated compression stockings or compression wraps  is offering.   The patient can be assessed for a Lymph Pump at that time  3. PAD (peripheral artery disease) (HCC)  Recommend:  The patient has evidence of atherosclerosis of the lower extremities with claudication.  The patient does not voice lifestyle limiting changes at this point in time.  Noninvasive studies do  not suggest clinically significant change.  No invasive studies, angiography or surgery at this time The patient should continue walking and begin a more formal exercise program.  The patient should continue antiplatelet therapy and aggressive treatment of the lipid abnormalities  No changes in the patient's medications at this time  The patient should continue wearing graduated compression socks 10-15 mmHg strength to control the mild edema.    4. Pain of left lower extremity SEE # 1&2 - VAS [place] ABI WITH/WO TBI; Future  5. Lymphedema Recommend:  No surgery or intervention at this point in time.    I have reviewed my previous discussion with the patient regarding swelling and why it causes symptoms.  Patient will continue wearing graduated compression stockings class 1 (20-30 mmHg) on a daily basis. The patient will  beginning wearing the stockings first thing in the morning and removing them in the evening. The patient is instructed specifically not to sleep in the stockings.    In addition, behavioral modification including several periods of elevation of the lower extremities during the day will be continued.  This was reviewed with the patient during the initial visit.  The patient will also continue routine exercise, especially walking on a daily basis as was discussed during the initial visit.    Despite conservative treatments including graduated compression therapy class 1 and behavioral modification including exercise and elevation the patient  has not obtained adequate control of the lymphedema.  The patient still has stage 3 lymphedema and therefore, I believe that a lymph pump should be added to improve the control of the patient's lymphedema.  Additionally, a lymph pump is warranted because it will reduce the risk of cellulitis and ulceration in the future.   6. Atrial fibrillation, unspecified type ([place]) Continue antiarrhythmia medications as already ordered, these  medications have been reviewed and there are no changes at this time.  Continue anticoagulation as ordered by Cardiology Service   7. Mixed hyperlipidemia Continue statin as ordered and reviewed, no changes at this time     Hortencia Pilar, MD  04/23/2017 9:48 PM

## 2017-04-28 DIAGNOSIS — Z125 Encounter for screening for malignant neoplasm of prostate: Secondary | ICD-10-CM | POA: Diagnosis not present

## 2017-04-28 DIAGNOSIS — E538 Deficiency of other specified B group vitamins: Secondary | ICD-10-CM | POA: Diagnosis not present

## 2017-04-28 DIAGNOSIS — E119 Type 2 diabetes mellitus without complications: Secondary | ICD-10-CM | POA: Diagnosis not present

## 2017-04-28 DIAGNOSIS — I482 Chronic atrial fibrillation: Secondary | ICD-10-CM | POA: Diagnosis not present

## 2017-04-30 ENCOUNTER — Encounter (INDEPENDENT_AMBULATORY_CARE_PROVIDER_SITE_OTHER): Payer: Self-pay

## 2017-04-30 ENCOUNTER — Ambulatory Visit (INDEPENDENT_AMBULATORY_CARE_PROVIDER_SITE_OTHER): Payer: Medicare HMO | Admitting: Vascular Surgery

## 2017-04-30 VITALS — BP 115/60 | HR 63 | Resp 16 | Wt 168.0 lb

## 2017-04-30 DIAGNOSIS — I872 Venous insufficiency (chronic) (peripheral): Secondary | ICD-10-CM

## 2017-04-30 DIAGNOSIS — I890 Lymphedema, not elsewhere classified: Secondary | ICD-10-CM

## 2017-04-30 NOTE — Progress Notes (Signed)
History of Present Illness  There is no documented history at this time  Assessments & Plan   There are no diagnoses linked to this encounter.    Additional instructions  Subjective:  Patient presents with venous ulcer of the Left lower extremity.    Procedure:  3 layer unna wrap was placed Left lower extremity.   Plan:   Follow up in one week.  

## 2017-05-05 DIAGNOSIS — E538 Deficiency of other specified B group vitamins: Secondary | ICD-10-CM | POA: Diagnosis not present

## 2017-05-05 DIAGNOSIS — I420 Dilated cardiomyopathy: Secondary | ICD-10-CM | POA: Diagnosis not present

## 2017-05-05 DIAGNOSIS — D500 Iron deficiency anemia secondary to blood loss (chronic): Secondary | ICD-10-CM | POA: Diagnosis not present

## 2017-05-05 DIAGNOSIS — Z0000 Encounter for general adult medical examination without abnormal findings: Secondary | ICD-10-CM | POA: Diagnosis not present

## 2017-05-05 DIAGNOSIS — E039 Hypothyroidism, unspecified: Secondary | ICD-10-CM | POA: Diagnosis not present

## 2017-05-05 DIAGNOSIS — E119 Type 2 diabetes mellitus without complications: Secondary | ICD-10-CM | POA: Diagnosis not present

## 2017-05-07 ENCOUNTER — Ambulatory Visit (INDEPENDENT_AMBULATORY_CARE_PROVIDER_SITE_OTHER): Payer: Medicare HMO | Admitting: Vascular Surgery

## 2017-05-07 ENCOUNTER — Encounter (INDEPENDENT_AMBULATORY_CARE_PROVIDER_SITE_OTHER): Payer: Self-pay

## 2017-05-07 VITALS — BP 116/54 | HR 57 | Resp 16

## 2017-05-07 DIAGNOSIS — I890 Lymphedema, not elsewhere classified: Secondary | ICD-10-CM

## 2017-05-07 NOTE — Progress Notes (Signed)
History of Present Illness  There is no documented history at this time  Assessments & Plan   There are no diagnoses linked to this encounter.    Additional instructions  Subjective:  Patient presents with venous ulcer of the Left lower extremity.    Procedure:  3 layer unna wrap was placed Left lower extremity.   Plan:   Follow up in one week.  

## 2017-05-08 ENCOUNTER — Ambulatory Visit: Payer: Medicare HMO | Admitting: Podiatry

## 2017-05-12 DIAGNOSIS — D500 Iron deficiency anemia secondary to blood loss (chronic): Secondary | ICD-10-CM | POA: Diagnosis not present

## 2017-05-26 DIAGNOSIS — I480 Paroxysmal atrial fibrillation: Secondary | ICD-10-CM | POA: Diagnosis not present

## 2017-06-23 DIAGNOSIS — I480 Paroxysmal atrial fibrillation: Secondary | ICD-10-CM | POA: Diagnosis not present

## 2017-07-08 ENCOUNTER — Ambulatory Visit (INDEPENDENT_AMBULATORY_CARE_PROVIDER_SITE_OTHER): Payer: Medicare HMO

## 2017-07-08 DIAGNOSIS — L97929 Non-pressure chronic ulcer of unspecified part of left lower leg with unspecified severity: Secondary | ICD-10-CM

## 2017-07-08 DIAGNOSIS — I83029 Varicose veins of left lower extremity with ulcer of unspecified site: Secondary | ICD-10-CM

## 2017-07-08 DIAGNOSIS — M79605 Pain in left leg: Secondary | ICD-10-CM

## 2017-07-21 DIAGNOSIS — I480 Paroxysmal atrial fibrillation: Secondary | ICD-10-CM | POA: Diagnosis not present

## 2017-07-29 DIAGNOSIS — E782 Mixed hyperlipidemia: Secondary | ICD-10-CM | POA: Diagnosis not present

## 2017-07-29 DIAGNOSIS — E119 Type 2 diabetes mellitus without complications: Secondary | ICD-10-CM | POA: Diagnosis not present

## 2017-07-29 DIAGNOSIS — I482 Chronic atrial fibrillation: Secondary | ICD-10-CM | POA: Diagnosis not present

## 2017-07-29 DIAGNOSIS — I2510 Atherosclerotic heart disease of native coronary artery without angina pectoris: Secondary | ICD-10-CM | POA: Diagnosis not present

## 2017-07-29 DIAGNOSIS — I420 Dilated cardiomyopathy: Secondary | ICD-10-CM | POA: Diagnosis not present

## 2017-07-29 DIAGNOSIS — Z951 Presence of aortocoronary bypass graft: Secondary | ICD-10-CM | POA: Diagnosis not present

## 2017-07-29 DIAGNOSIS — I10 Essential (primary) hypertension: Secondary | ICD-10-CM | POA: Diagnosis not present

## 2017-08-03 DIAGNOSIS — S0101XA Laceration without foreign body of scalp, initial encounter: Secondary | ICD-10-CM | POA: Diagnosis not present

## 2017-08-18 DIAGNOSIS — I480 Paroxysmal atrial fibrillation: Secondary | ICD-10-CM | POA: Diagnosis not present

## 2017-09-04 DIAGNOSIS — L03119 Cellulitis of unspecified part of limb: Secondary | ICD-10-CM | POA: Diagnosis not present

## 2017-09-04 DIAGNOSIS — L02619 Cutaneous abscess of unspecified foot: Secondary | ICD-10-CM | POA: Diagnosis not present

## 2017-09-15 DIAGNOSIS — I480 Paroxysmal atrial fibrillation: Secondary | ICD-10-CM | POA: Diagnosis not present

## 2017-10-05 DIAGNOSIS — L02619 Cutaneous abscess of unspecified foot: Secondary | ICD-10-CM | POA: Diagnosis not present

## 2017-10-05 DIAGNOSIS — Z23 Encounter for immunization: Secondary | ICD-10-CM | POA: Diagnosis not present

## 2017-10-05 DIAGNOSIS — L03119 Cellulitis of unspecified part of limb: Secondary | ICD-10-CM | POA: Diagnosis not present

## 2017-10-13 DIAGNOSIS — I480 Paroxysmal atrial fibrillation: Secondary | ICD-10-CM | POA: Diagnosis not present

## 2017-10-29 DIAGNOSIS — E119 Type 2 diabetes mellitus without complications: Secondary | ICD-10-CM | POA: Diagnosis not present

## 2017-10-29 DIAGNOSIS — E538 Deficiency of other specified B group vitamins: Secondary | ICD-10-CM | POA: Diagnosis not present

## 2017-10-29 DIAGNOSIS — D500 Iron deficiency anemia secondary to blood loss (chronic): Secondary | ICD-10-CM | POA: Diagnosis not present

## 2017-10-29 DIAGNOSIS — E039 Hypothyroidism, unspecified: Secondary | ICD-10-CM | POA: Diagnosis not present

## 2017-11-05 DIAGNOSIS — E119 Type 2 diabetes mellitus without complications: Secondary | ICD-10-CM | POA: Diagnosis not present

## 2017-11-05 DIAGNOSIS — D500 Iron deficiency anemia secondary to blood loss (chronic): Secondary | ICD-10-CM | POA: Diagnosis not present

## 2017-11-05 DIAGNOSIS — E538 Deficiency of other specified B group vitamins: Secondary | ICD-10-CM | POA: Diagnosis not present

## 2017-11-10 DIAGNOSIS — I480 Paroxysmal atrial fibrillation: Secondary | ICD-10-CM | POA: Diagnosis not present

## 2017-12-08 DIAGNOSIS — I480 Paroxysmal atrial fibrillation: Secondary | ICD-10-CM | POA: Diagnosis not present

## 2018-01-05 DIAGNOSIS — I480 Paroxysmal atrial fibrillation: Secondary | ICD-10-CM | POA: Diagnosis not present

## 2018-02-01 DIAGNOSIS — Z951 Presence of aortocoronary bypass graft: Secondary | ICD-10-CM | POA: Diagnosis not present

## 2018-02-01 DIAGNOSIS — I872 Venous insufficiency (chronic) (peripheral): Secondary | ICD-10-CM | POA: Diagnosis not present

## 2018-02-01 DIAGNOSIS — I482 Chronic atrial fibrillation: Secondary | ICD-10-CM | POA: Diagnosis not present

## 2018-02-01 DIAGNOSIS — I420 Dilated cardiomyopathy: Secondary | ICD-10-CM | POA: Diagnosis not present

## 2018-02-01 DIAGNOSIS — E119 Type 2 diabetes mellitus without complications: Secondary | ICD-10-CM | POA: Diagnosis not present

## 2018-02-01 DIAGNOSIS — I2510 Atherosclerotic heart disease of native coronary artery without angina pectoris: Secondary | ICD-10-CM | POA: Diagnosis not present

## 2018-02-01 DIAGNOSIS — E782 Mixed hyperlipidemia: Secondary | ICD-10-CM | POA: Diagnosis not present

## 2018-02-01 DIAGNOSIS — I10 Essential (primary) hypertension: Secondary | ICD-10-CM | POA: Diagnosis not present

## 2018-02-02 DIAGNOSIS — I480 Paroxysmal atrial fibrillation: Secondary | ICD-10-CM | POA: Diagnosis not present

## 2018-03-23 DIAGNOSIS — I480 Paroxysmal atrial fibrillation: Secondary | ICD-10-CM | POA: Diagnosis not present

## 2018-04-20 DIAGNOSIS — I480 Paroxysmal atrial fibrillation: Secondary | ICD-10-CM | POA: Diagnosis not present

## 2018-04-29 DIAGNOSIS — D500 Iron deficiency anemia secondary to blood loss (chronic): Secondary | ICD-10-CM | POA: Diagnosis not present

## 2018-04-29 DIAGNOSIS — E119 Type 2 diabetes mellitus without complications: Secondary | ICD-10-CM | POA: Diagnosis not present

## 2018-04-29 DIAGNOSIS — E538 Deficiency of other specified B group vitamins: Secondary | ICD-10-CM | POA: Diagnosis not present

## 2018-05-07 DIAGNOSIS — I482 Chronic atrial fibrillation: Secondary | ICD-10-CM | POA: Diagnosis not present

## 2018-05-07 DIAGNOSIS — E538 Deficiency of other specified B group vitamins: Secondary | ICD-10-CM | POA: Diagnosis not present

## 2018-05-07 DIAGNOSIS — Z0000 Encounter for general adult medical examination without abnormal findings: Secondary | ICD-10-CM | POA: Diagnosis not present

## 2018-05-07 DIAGNOSIS — E039 Hypothyroidism, unspecified: Secondary | ICD-10-CM | POA: Diagnosis not present

## 2018-05-07 DIAGNOSIS — E119 Type 2 diabetes mellitus without complications: Secondary | ICD-10-CM | POA: Diagnosis not present

## 2018-05-07 DIAGNOSIS — D500 Iron deficiency anemia secondary to blood loss (chronic): Secondary | ICD-10-CM | POA: Diagnosis not present

## 2018-05-18 DIAGNOSIS — I480 Paroxysmal atrial fibrillation: Secondary | ICD-10-CM | POA: Diagnosis not present

## 2018-05-25 DIAGNOSIS — J40 Bronchitis, not specified as acute or chronic: Secondary | ICD-10-CM | POA: Diagnosis not present

## 2018-05-25 DIAGNOSIS — R05 Cough: Secondary | ICD-10-CM | POA: Diagnosis not present

## 2018-05-26 DIAGNOSIS — M79675 Pain in left toe(s): Secondary | ICD-10-CM | POA: Diagnosis not present

## 2018-05-26 DIAGNOSIS — B351 Tinea unguium: Secondary | ICD-10-CM | POA: Diagnosis not present

## 2018-05-26 DIAGNOSIS — M79674 Pain in right toe(s): Secondary | ICD-10-CM | POA: Diagnosis not present

## 2018-05-28 DIAGNOSIS — E119 Type 2 diabetes mellitus without complications: Secondary | ICD-10-CM | POA: Diagnosis not present

## 2018-05-28 DIAGNOSIS — J181 Lobar pneumonia, unspecified organism: Secondary | ICD-10-CM | POA: Diagnosis not present

## 2018-06-01 ENCOUNTER — Emergency Department: Payer: Medicare HMO

## 2018-06-01 ENCOUNTER — Other Ambulatory Visit: Payer: Self-pay

## 2018-06-01 ENCOUNTER — Encounter: Payer: Self-pay | Admitting: Emergency Medicine

## 2018-06-01 ENCOUNTER — Emergency Department
Admission: EM | Admit: 2018-06-01 | Discharge: 2018-06-01 | Disposition: A | Discharge status: Home / Self Care | Payer: Medicare HMO | Attending: Student in an Organized Health Care Education/Training Program | Admitting: Student in an Organized Health Care Education/Training Program

## 2018-06-01 DIAGNOSIS — I119 Hypertensive heart disease without heart failure: Secondary | ICD-10-CM | POA: Insufficient documentation

## 2018-06-01 DIAGNOSIS — I2510 Atherosclerotic heart disease of native coronary artery without angina pectoris: Secondary | ICD-10-CM | POA: Insufficient documentation

## 2018-06-01 DIAGNOSIS — K5900 Constipation, unspecified: Secondary | ICD-10-CM | POA: Insufficient documentation

## 2018-06-01 DIAGNOSIS — Z7901 Long term (current) use of anticoagulants: Secondary | ICD-10-CM | POA: Diagnosis not present

## 2018-06-01 DIAGNOSIS — Z79899 Other long term (current) drug therapy: Secondary | ICD-10-CM | POA: Diagnosis not present

## 2018-06-01 DIAGNOSIS — E119 Type 2 diabetes mellitus without complications: Secondary | ICD-10-CM | POA: Insufficient documentation

## 2018-06-01 HISTORY — DX: Disorder of thyroid, unspecified: E07.9

## 2018-06-01 HISTORY — DX: Deficiency of other specified B group vitamins: E53.8

## 2018-06-01 HISTORY — DX: Unspecified atrial fibrillation: I48.91

## 2018-06-01 HISTORY — DX: Atherosclerotic heart disease of native coronary artery without angina pectoris: I25.10

## 2018-06-01 HISTORY — DX: Iron deficiency anemia, unspecified: D50.9

## 2018-06-01 MED ORDER — GLYCERIN (ADULT) 2 G RE SUPP: 1.0000 | RECTAL | 0 refills | Status: DC | PRN

## 2018-06-01 MED ORDER — GLYCERIN (LAXATIVE) 2.1 G RE SUPP
1.0000 | Freq: Once | RECTAL | Status: AC
  Administered 2018-06-01: 1 via RECTAL
  Filled 2018-06-01: qty 1

## 2018-06-01 MED ORDER — POLYETHYLENE GLYCOL 3350 17 G PO PACK: 17.0000 g | PACK | Freq: Every day | ORAL | 0 refills | Status: DC

## 2018-06-01 NOTE — ED Notes (Signed)
Resting in the bed NAD.

## 2018-06-01 NOTE — ED Notes (Signed)
Pt reports no bowel movement in the last 6-7 days. Pt states has been taking stool softners with no relief. Pt reports has not used a laxative. Pt denies pain just needs to be able to use the restroom.

## 2018-06-01 NOTE — ED Notes (Signed)
D/w Dr. Corky Downs, new order received for KUB only, no labs ordered at this time. Pt agreeable to plan.

## 2018-06-01 NOTE — ED Provider Notes (Signed)
[facility] Emergency Department Provider Note    First MD Initiated Contact with Patient 06/01/18 1227     (approximate)  I have reviewed the triage vital signs and the nursing notes.   HISTORY  Chief Complaint Constipation    HPI [name] [name] is a 84 y.o. male resents to the ER with chief complaint of not moving his bowels for the past 6 to 7 days.  Denies any pain.  States that he has been taking over-the-counter laxatives without any relief.  Is only tried 2 of them.  Has had issues with this in the past.  Denies any nausea or vomiting.  Still passing gas.  Denies any other symptoms or concerns at this time.    Past Medical History:  Diagnosis Date  . A-fib ([place])   . B12 deficiency   . Coronary artery disease   . Diabetes mellitus without complication ([place])   . Hypertension   . Iron deficiency anemia   . Thyroid disease    No family history on file.  Patient Active Problem List   Diagnosis Date Noted  . Varicose veins of lower extremities with ulcer ([place]) 04/23/2017  . Chronic venous insufficiency 04/23/2017  . Leg pain 04/23/2017  . Lymphedema 04/23/2017  . A-fib ([place]) 04/23/2017  . Hyperlipidemia 04/23/2017      Prior to Admission medications   Medication Sig Start Date End Date Taking? Authorizing Provider  ALPRAZolam Duanne Moron) 0.25 MG tablet  03/31/14   [provider]  clobetasol ointment (TEMOVATE) 3.29 % Apply 1 application topically 2 (two) times daily. 04/21/17   Edrick Kins, DPM  Cyanocobalamin (VITAMIN B-12) 1000 MCG SUBL Take by mouth. 03/23/14   [provider]  doxycycline (VIBRAMYCIN) 100 MG capsule Take 1 capsule (100 mg total) by mouth 2 (two) times daily. 04/13/17   Edrick Kins, DPM  glycerin adult 2 g suppository Place 1 suppository rectally as needed for constipation. 06/01/18   Merlyn Lot, MD  levothyroxine (SYNTHROID, LEVOTHROID) 112 MCG tablet  03/28/14   [provider]    metoprolol succinate (TOPROL-XL) 50 MG 24 hr tablet  03/28/14   [provider]  Multiple Vitamins-Minerals (MULTIVITAMIN WITH MINERALS) tablet Take by mouth. 06/11/16   [provider]  mupirocin ointment (BACTROBAN) 2 % Place 1 application into the nose 2 (two) times daily. 04/07/17   Edrick Kins, DPM  nizatidine (AXID) 150 MG capsule Take 150 mg by mouth 2 (two) times daily.    [provider]  omeprazole (PRILOSEC) 20 MG capsule Take by mouth. 01/06/17 07/05/17  [provider]  polyethylene glycol (MIRALAX / GLYCOLAX) packet Take 17 g by mouth daily. Mix one tablespoon with 8oz of your favorite juice or water every day until you are having soft formed stools. Then start taking once daily if you didn't have a stool the day before. 06/01/18   Merlyn Lot, MD  potassium chloride (K-DUR,KLOR-CON) 10 MEQ tablet Take by mouth. 09/25/14   [provider]  simvastatin (ZOCOR) 40 MG tablet  03/28/14   [provider]  triamcinolone cream (KENALOG) 0.5 % Apply 1 application topically 3 (three) times daily. 04/21/17   Edrick Kins, DPM  warfarin (COUMADIN) 5 MG tablet  03/28/14   [provider]    Allergies Codeine    Social History Social History   Tobacco Use  . Smoking status: Never Smoker  . Smokeless tobacco: Never Used  Substance Use Topics  . Alcohol  use: No  . Drug use: No    Review of Systems Patient denies headaches, rhinorrhea, blurry vision, numbness, shortness of breath, chest pain, edema, cough, abdominal pain, nausea, vomiting, diarrhea, dysuria, fevers, rashes or hallucinations unless otherwise stated above in HPI. ____________________________________________   PHYSICAL EXAM:  VITAL SIGNS: Vitals:   06/01/18 1131  BP: 126/60  Pulse: 76  Resp: 18  Temp: 98.2 F (36.8 C)  SpO2: 95%    Constitutional: Alert and oriented.  Eyes: Conjunctivae are normal.  Head: Atraumatic. Nose: No  congestion/rhinnorhea. Mouth/Throat: Mucous membranes are moist.   Neck: No stridor. Painless ROM.  Cardiovascular: Normal rate, regular rhythm. Grossly normal heart sounds.  Good peripheral circulation. Respiratory: Normal respiratory effort.  No retractions. Lungs CTAB. Gastrointestinal: Soft and nontender. No distention. No abdominal bruits. No CVA tenderness. Genitourinary: Soft formed stool in rectal vault.  No fecal impaction. Musculoskeletal: No lower extremity tenderness nor edema.  No joint effusions. Neurologic:  Normal speech and language. No gross focal neurologic deficits are appreciated. No facial droop Skin:  Skin is warm, dry and intact. No rash noted. Psychiatric: Mood and affect are normal. Speech and behavior are normal.  ____________________________________________   LABS (all labs ordered are listed, but only abnormal results are displayed)  No results found for this or any previous visit (from the past 24 hour(s)). ____________________________________________ ____________________________________________  [id number]  I personally reviewed all radiographic images ordered to evaluate for the above acute complaints and reviewed radiology reports and findings.  These findings were personally discussed with the patient.  Please see medical record for radiology report.  ____________________________________________   PROCEDURES  Procedure(s) performed:  Procedures    Critical Care performed: no ____________________________________________   INITIAL IMPRESSION / ASSESSMENT AND PLAN / ED COURSE  Pertinent labs & imaging results that were available during my care of the patient were reviewed by me and considered in my medical decision making (see chart for details).   DDX: Constipation, obstipation, SBO, mass  [name] [name] is a 84 y.o. who presents to the ED with constipation as described above.  No evidence of fecal impaction.  Not clinically consistent  with SBO.  Patient given stools suppository and will be started on prescription laxatives.  Discussed signs and symptoms for which the patient should return to the ER.  Have discussed with the patient and available family all diagnostics and treatments performed thus far and all questions were answered to the best of my ability. The patient demonstrates understanding and agreement with plan.       As part of my medical decision making, I reviewed the following data within the [place] notes reviewed and incorporated, Labs reviewed, notes from prior ED visits.   ____________________________________________   FINAL CLINICAL IMPRESSION(S) / ED DIAGNOSES  Final diagnoses:  Constipation, unspecified constipation type      NEW MEDICATIONS STARTED DURING THIS VISIT:  New Prescriptions   GLYCERIN ADULT 2 G SUPPOSITORY    Place 1 suppository rectally as needed for constipation.   POLYETHYLENE GLYCOL (MIRALAX / GLYCOLAX) PACKET    Take 17 g by mouth daily. Mix one tablespoon with 8oz of your favorite juice or water every day until you are having soft formed stools. Then start taking once daily if you didn't have a stool the day before.     Note:  This document was prepared using Dragon voice recognition software and may include unintentional dictation errors.    Merlyn Lot, MD 06/01/18 1350

## 2018-06-01 NOTE — ED Triage Notes (Addendum)
Pt arrived via POV, pt states he drove himself here. Pt states he has not had a BM in 6 days. Pt states he has taken OTC medication without relief.  Pt denies any abdominal pain, just states he has not been able to have BM.  Pt states he is passing some gas.    Pt is ambulatory and lives independently and drove himself to the ED.    Pt recently finished antibiotics for PNA. Pt states he is on iron pills which made him constipated.

## 2018-06-07 DIAGNOSIS — D500 Iron deficiency anemia secondary to blood loss (chronic): Secondary | ICD-10-CM | POA: Diagnosis not present

## 2018-06-15 DIAGNOSIS — I480 Paroxysmal atrial fibrillation: Secondary | ICD-10-CM | POA: Diagnosis not present

## 2018-07-13 DIAGNOSIS — I480 Paroxysmal atrial fibrillation: Secondary | ICD-10-CM | POA: Diagnosis not present

## 2018-07-23 DIAGNOSIS — S0501XA Injury of conjunctiva and corneal abrasion without foreign body, right eye, initial encounter: Secondary | ICD-10-CM | POA: Diagnosis not present

## 2018-08-11 DIAGNOSIS — L578 Other skin changes due to chronic exposure to nonionizing radiation: Secondary | ICD-10-CM | POA: Diagnosis not present

## 2018-08-11 DIAGNOSIS — L570 Actinic keratosis: Secondary | ICD-10-CM | POA: Diagnosis not present

## 2018-08-11 DIAGNOSIS — L821 Other seborrheic keratosis: Secondary | ICD-10-CM | POA: Diagnosis not present

## 2018-08-11 DIAGNOSIS — D692 Other nonthrombocytopenic purpura: Secondary | ICD-10-CM | POA: Diagnosis not present

## 2018-08-11 DIAGNOSIS — L820 Inflamed seborrheic keratosis: Secondary | ICD-10-CM | POA: Diagnosis not present

## 2018-08-12 DIAGNOSIS — I480 Paroxysmal atrial fibrillation: Secondary | ICD-10-CM | POA: Diagnosis not present

## 2018-08-24 DIAGNOSIS — E782 Mixed hyperlipidemia: Secondary | ICD-10-CM | POA: Diagnosis not present

## 2018-08-24 DIAGNOSIS — I482 Chronic atrial fibrillation: Secondary | ICD-10-CM | POA: Diagnosis not present

## 2018-08-24 DIAGNOSIS — I10 Essential (primary) hypertension: Secondary | ICD-10-CM | POA: Diagnosis not present

## 2018-08-24 DIAGNOSIS — I2510 Atherosclerotic heart disease of native coronary artery without angina pectoris: Secondary | ICD-10-CM | POA: Diagnosis not present

## 2018-08-24 DIAGNOSIS — Z951 Presence of aortocoronary bypass graft: Secondary | ICD-10-CM | POA: Diagnosis not present

## 2018-08-24 DIAGNOSIS — I420 Dilated cardiomyopathy: Secondary | ICD-10-CM | POA: Diagnosis not present

## 2018-08-24 DIAGNOSIS — E119 Type 2 diabetes mellitus without complications: Secondary | ICD-10-CM | POA: Diagnosis not present

## 2018-09-09 DIAGNOSIS — I480 Paroxysmal atrial fibrillation: Secondary | ICD-10-CM | POA: Diagnosis not present

## 2018-10-07 DIAGNOSIS — I480 Paroxysmal atrial fibrillation: Secondary | ICD-10-CM | POA: Diagnosis not present

## 2018-11-02 DIAGNOSIS — E039 Hypothyroidism, unspecified: Secondary | ICD-10-CM | POA: Diagnosis not present

## 2018-11-02 DIAGNOSIS — E538 Deficiency of other specified B group vitamins: Secondary | ICD-10-CM | POA: Diagnosis not present

## 2018-11-02 DIAGNOSIS — I480 Paroxysmal atrial fibrillation: Secondary | ICD-10-CM | POA: Diagnosis not present

## 2018-11-02 DIAGNOSIS — E119 Type 2 diabetes mellitus without complications: Secondary | ICD-10-CM | POA: Diagnosis not present

## 2018-11-10 DIAGNOSIS — E782 Mixed hyperlipidemia: Secondary | ICD-10-CM | POA: Diagnosis not present

## 2018-11-10 DIAGNOSIS — E1151 Type 2 diabetes mellitus with diabetic peripheral angiopathy without gangrene: Secondary | ICD-10-CM

## 2018-11-10 DIAGNOSIS — Z23 Encounter for immunization: Secondary | ICD-10-CM | POA: Diagnosis not present

## 2018-11-10 DIAGNOSIS — E538 Deficiency of other specified B group vitamins: Secondary | ICD-10-CM | POA: Diagnosis not present

## 2018-11-10 HISTORY — DX: Type 2 diabetes mellitus with diabetic peripheral angiopathy without gangrene: E11.51

## 2018-11-11 DIAGNOSIS — L821 Other seborrheic keratosis: Secondary | ICD-10-CM | POA: Diagnosis not present

## 2018-11-11 DIAGNOSIS — L570 Actinic keratosis: Secondary | ICD-10-CM | POA: Diagnosis not present

## 2018-11-11 DIAGNOSIS — L578 Other skin changes due to chronic exposure to nonionizing radiation: Secondary | ICD-10-CM | POA: Diagnosis not present

## 2018-11-11 DIAGNOSIS — Z85820 Personal history of malignant melanoma of skin: Secondary | ICD-10-CM | POA: Diagnosis not present

## 2018-11-11 DIAGNOSIS — Z1283 Encounter for screening for malignant neoplasm of skin: Secondary | ICD-10-CM | POA: Diagnosis not present

## 2018-11-30 DIAGNOSIS — I480 Paroxysmal atrial fibrillation: Secondary | ICD-10-CM | POA: Diagnosis not present

## 2018-12-28 DIAGNOSIS — I480 Paroxysmal atrial fibrillation: Secondary | ICD-10-CM | POA: Diagnosis not present

## 2019-01-13 DIAGNOSIS — H1859 Other hereditary corneal dystrophies: Secondary | ICD-10-CM | POA: Diagnosis not present

## 2019-01-14 DIAGNOSIS — J40 Bronchitis, not specified as acute or chronic: Secondary | ICD-10-CM | POA: Diagnosis not present

## 2019-01-14 DIAGNOSIS — E1151 Type 2 diabetes mellitus with diabetic peripheral angiopathy without gangrene: Secondary | ICD-10-CM | POA: Diagnosis not present

## 2019-01-14 DIAGNOSIS — I420 Dilated cardiomyopathy: Secondary | ICD-10-CM | POA: Diagnosis not present

## 2019-01-25 DIAGNOSIS — I480 Paroxysmal atrial fibrillation: Secondary | ICD-10-CM | POA: Diagnosis not present

## 2019-02-05 ENCOUNTER — Other Ambulatory Visit: Payer: Self-pay

## 2019-02-05 ENCOUNTER — Encounter: Payer: Self-pay | Admitting: Emergency Medicine

## 2019-02-05 ENCOUNTER — Emergency Department
Admission: EM | Admit: 2019-02-05 | Discharge: 2019-02-05 | Disposition: A | Discharge status: Home / Self Care | Payer: Medicare HMO | Attending: Emergency Medicine | Admitting: Emergency Medicine

## 2019-02-05 DIAGNOSIS — Z79899 Other long term (current) drug therapy: Secondary | ICD-10-CM | POA: Insufficient documentation

## 2019-02-05 DIAGNOSIS — I2510 Atherosclerotic heart disease of native coronary artery without angina pectoris: Secondary | ICD-10-CM | POA: Insufficient documentation

## 2019-02-05 DIAGNOSIS — R112 Nausea with vomiting, unspecified: Secondary | ICD-10-CM | POA: Diagnosis not present

## 2019-02-05 DIAGNOSIS — E785 Hyperlipidemia, unspecified: Secondary | ICD-10-CM | POA: Insufficient documentation

## 2019-02-05 DIAGNOSIS — I4891 Unspecified atrial fibrillation: Secondary | ICD-10-CM | POA: Diagnosis not present

## 2019-02-05 LAB — COMPREHENSIVE METABOLIC PANEL
ALT: 21 U/L (ref 0–44)
ANION GAP: 7 (ref 5–15)
AST: 27 U/L (ref 15–41)
Albumin: 4.4 g/dL (ref 3.5–5.0)
Alkaline Phosphatase: 51 U/L (ref 38–126)
BUN: 28 mg/dL — ABNORMAL HIGH (ref 8–23)
CHLORIDE: 105 mmol/L (ref 98–111)
CO2: 26 mmol/L (ref 22–32)
Calcium: 8.9 mg/dL (ref 8.9–10.3)
Creatinine, Ser: 1.06 mg/dL (ref 0.61–1.24)
GFR calc Af Amer: >60 mL/min (ref >60)
GFR calc non Af Amer: >60 mL/min (ref >60)
Glucose, Bld: 139 mg/dL — ABNORMAL HIGH (ref 70–99)
POTASSIUM: 4.3 mmol/L (ref 3.5–5.1)
SODIUM: 138 mmol/L (ref 135–145)
Total Bilirubin: 1.1 mg/dL (ref 0.3–1.2)
Total Protein: 7.5 g/dL (ref 6.5–8.1)

## 2019-02-05 LAB — URINALYSIS, COMPLETE (UACMP) WITH MICROSCOPIC
BILIRUBIN URINE: NEGATIVE
Bacteria, UA: NONE SEEN
Glucose, UA: NEGATIVE mg/dL
Hgb urine dipstick: NEGATIVE
Ketones, ur: NEGATIVE mg/dL
Leukocytes, UA: NEGATIVE
Nitrite: NEGATIVE
Protein, ur: NEGATIVE mg/dL
Specific Gravity, Urine: 1.018 (ref 1.005–1.030)
pH: 6 (ref 5.0–8.0)

## 2019-02-05 LAB — CBC
HCT: 33.8 % — ABNORMAL LOW (ref 39.0–52.0)
HEMOGLOBIN: 11.6 g/dL — AB (ref 13.0–17.0)
MCH: 34.8 pg — ABNORMAL HIGH (ref 26.0–34.0)
MCHC: 34.3 g/dL (ref 30.0–36.0)
MCV: 101.5 fL — ABNORMAL HIGH (ref 80.0–100.0)
Platelets: 152 10*3/uL (ref 150–400)
RBC: 3.33 MIL/uL — AB (ref 4.22–5.81)
RDW: 19.6 % — ABNORMAL HIGH (ref 11.5–15.5)
WBC: 10.5 10*3/uL (ref 4.0–10.5)
nRBC: 0 % (ref 0.0–0.2)

## 2019-02-05 LAB — PROTIME-INR
INR: 2.99
Prothrombin Time: 30.6 seconds — ABNORMAL HIGH (ref 11.4–15.2)

## 2019-02-05 LAB — LIPASE, BLOOD: Lipase: 27 U/L (ref 11–51)

## 2019-02-05 LAB — TROPONIN I: Troponin I: <0.03 ng/mL (ref <0.03)

## 2019-02-05 MED ORDER — ONDANSETRON 4 MG PO TBDP
4.0000 mg | ORAL_TABLET | Freq: Once | ORAL | Status: AC
  Administered 2019-02-05: 4 mg via ORAL
  Filled 2019-02-05: qty 1

## 2019-02-05 MED ORDER — ONDANSETRON 4 MG PO TBDP: 4.0000 mg | ORAL_TABLET | Freq: Three times a day (TID) | ORAL | 0 refills | Status: DC | PRN

## 2019-02-05 NOTE — ED Provider Notes (Signed)
[facility] Emergency Department Provider Note   ____________________________________________   First MD Initiated Contact with Patient 02/05/19 [phone]     (approximate)  I have reviewed the triage vital signs and the nursing notes.   HISTORY  Chief Complaint Emesis    HPI [name] [name] is a 85 y.o. male brought to the ED from home with a chief complaint of nausea/vomiting.  Patient reports eating at a church dinner tonight. Reports 2 episodes of vomiting. Denies associated fever, chills, chest pain, shortness of breath, abdominal pain, dysuria, diarrhea. Denies recent travel or trauma.   Past Medical History:  Diagnosis Date  . A-fib ([place])   . B12 deficiency   . Coronary artery disease   . Iron deficiency anemia   . Thyroid disease     Patient Active Problem List   Diagnosis Date Noted  . Varicose veins of lower extremities with ulcer ([place]) 04/23/2017  . Chronic venous insufficiency 04/23/2017  . Leg pain 04/23/2017  . Lymphedema 04/23/2017  . A-fib ([place]) 04/23/2017  . Hyperlipidemia 04/23/2017    Past Surgical History:  Procedure Laterality Date  . CARDIAC SURGERY    . HERNIA REPAIR      Prior to Admission medications   Medication Sig Start Date End Date Taking? Authorizing Provider  ALPRAZolam Duanne Moron) 0.25 MG tablet  03/31/14   [provider]  clobetasol ointment (TEMOVATE) 3.08 % Apply 1 application topically 2 (two) times daily. 04/21/17   Edrick Kins, DPM  Cyanocobalamin (VITAMIN B-12) 1000 MCG SUBL Take by mouth. 03/23/14   [provider]  doxycycline (VIBRAMYCIN) 100 MG capsule Take 1 capsule (100 mg total) by mouth 2 (two) times daily. 04/13/17   Edrick Kins, DPM  glycerin adult 2 g suppository Place 1 suppository rectally as needed for constipation. 06/01/18   Merlyn Lot, MD  levothyroxine (SYNTHROID, LEVOTHROID) 112 MCG tablet  03/28/14   [provider]  metoprolol succinate (TOPROL-XL) 50  MG 24 hr tablet  03/28/14   [provider]  Multiple Vitamins-Minerals (MULTIVITAMIN WITH MINERALS) tablet Take by mouth. 06/11/16   [provider]  mupirocin ointment (BACTROBAN) 2 % Place 1 application into the nose 2 (two) times daily. 04/07/17   Edrick Kins, DPM  nizatidine (AXID) 150 MG capsule Take 150 mg by mouth 2 (two) times daily.    [provider]  omeprazole (PRILOSEC) 20 MG capsule Take by mouth. 01/06/17 07/05/17  [provider]  ondansetron (ZOFRAN ODT) 4 MG disintegrating tablet Take 1 tablet (4 mg total) by mouth every 8 (eight) hours as needed for nausea or vomiting. 02/05/19   Paulette Blanch, MD  polyethylene glycol [facility] / Floria Raveling) packet Take 17 g by mouth daily. Mix one tablespoon with 8oz of your favorite juice or water every day until you are having soft formed stools. Then start taking once daily if you didn't have a stool the day before. 06/01/18   Merlyn Lot, MD  potassium chloride (K-DUR,KLOR-CON) 10 MEQ tablet Take by mouth. 09/25/14   [provider]  simvastatin (ZOCOR) 40 MG tablet  03/28/14   [provider]  triamcinolone cream (KENALOG) 0.5 % Apply 1 application topically 3 (three) times daily. 04/21/17   Edrick Kins, DPM  warfarin (COUMADIN) 5 MG tablet  03/28/14   [provider]    Allergies Codeine  No family history on file.  Social History Social History   Tobacco Use  . Smoking status: Never Smoker  .  Smokeless tobacco: Never Used  Substance Use Topics  . Alcohol use: No  . Drug use: No    Review of Systems  Constitutional: No fever/chills Eyes: No visual changes. ENT: No sore throat. Cardiovascular: Denies chest pain. Respiratory: Denies shortness of breath. Gastrointestinal: No abdominal pain.  Positive for nausea and vomiting.  No diarrhea.  No constipation. Genitourinary: Negative for dysuria. Musculoskeletal: Negative for back pain. Skin: Negative for  rash. Neurological: Negative for headaches, focal weakness or numbness.   ____________________________________________   PHYSICAL EXAM:  VITAL SIGNS: ED Triage Vitals  Enc Vitals Group     BP 02/05/19 0116 (!) 155/79     Pulse Rate 02/05/19 0116 77     Resp 02/05/19 0116 18     Temp 02/05/19 0116 97.6 F (36.4 C)     Temp Source 02/05/19 0116 Oral     SpO2 02/05/19 0116 97 %     Weight 02/05/19 0113 172 lb (78 kg)     Height 02/05/19 0113 5\' 8"  (1.727 m)     Head Circumference --      Peak Flow --      Pain Score 02/05/19 0116 0     Pain Loc --      Pain Edu? --      Excl. in [place]? --     Constitutional: Alert and oriented. Well appearing and in no acute distress. Eyes: Conjunctivae are normal. PERRL. EOMI. Head: Atraumatic. Nose: No congestion/rhinnorhea. Mouth/Throat: Mucous membranes are moist.  Oropharynx non-erythematous. Neck: No stridor.   Cardiovascular: Normal rate, regular rhythm. Grossly normal heart sounds.  Good peripheral circulation. Respiratory: Normal respiratory effort.  No retractions. Lungs CTAB. Gastrointestinal: Soft and nontender to light or deep palpation. No distention. No abdominal bruits. No CVA tenderness. Musculoskeletal: No lower extremity tenderness nor edema.  No joint effusions. Neurologic:  Normal speech and language. No gross focal neurologic deficits are appreciated. No gait instability. Skin:  Skin is warm, dry and intact. No rash noted. Psychiatric: Mood and affect are normal. Speech and behavior are normal.  ____________________________________________   LABS (all labs ordered are listed, but only abnormal results are displayed)  Labs Reviewed  COMPREHENSIVE METABOLIC PANEL - Abnormal; Notable for the following components:      Result Value   Glucose, Bld 139 (*)    BUN 28 (*)    All other components within normal limits  CBC - Abnormal; Notable for the following components:   RBC 3.33 (*)    Hemoglobin 11.6 (*)    HCT 33.8  (*)    MCV 101.5 (*)    MCH 34.8 (*)    RDW 19.6 (*)    All other components within normal limits  URINALYSIS, COMPLETE (UACMP) WITH MICROSCOPIC - Abnormal; Notable for the following components:   Color, Urine YELLOW (*)    APPearance CLEAR (*)    All other components within normal limits  PROTIME-INR - Abnormal; Notable for the following components:   Prothrombin Time 30.6 (*)    All other components within normal limits  LIPASE, BLOOD  TROPONIN I   ____________________________________________  EKG  ED ECG REPORT I, Dianara Smullen J, the attending physician, personally viewed and interpreted this ECG.   Date: 02/05/2019  EKG Time: 0201  Rate: 64  Rhythm: atrial fibrillation, rate 64  Axis: Normal  Intervals:none  ST&T Change: Nonspecific  ____________________________________________  RADIOLOGY  ED MD interpretation: None  Official radiology report(s): No results found.  ____________________________________________   PROCEDURES  Procedure(s) performed:  None  Procedures  Critical Care performed: No  ____________________________________________   INITIAL IMPRESSION / ASSESSMENT AND PLAN / ED COURSE  As part of my medical decision making, I reviewed the following data within the electronic MEDICAL RECORD NUMBER History obtained from family, Nursing notes reviewed and incorporated, Labs reviewed, Old chart reviewed and Notes from prior ED visits    85 year old male who presents with nausea and vomiting x2.  No chest or abdominal pain.  Nausea resolved after receiving IV Zofran prior to arrival.  Differential diagnosis includes but is not limited to ACS, PUD, gastroenteritis, colitis, UTI, etc.  Will obtain screening lab work including EKG and troponin, and reassess.   Clinical Course as of Feb 05 614  Sat Feb 05, 2019  0314 Updated patient of all test results. PO trial of ice chips.   [JS]  0336 Tolerated ice chips without emesis.  Overall feels much better and is  eager for discharge home.  Reexamined abdomen which remains nontender to palpation.  Will discharge home with prescription for ODT Zofran to use as needed and patient will follow-up with his PCP next week.  Strict return precautions given.  Patient and daughter verbalized understanding and agree with plan of care.   [JS]    Clinical Course User Index [JS] Paulette Blanch, MD     ____________________________________________   FINAL CLINICAL IMPRESSION(S) / ED DIAGNOSES  Final diagnoses:  Non-intractable vomiting with nausea, unspecified vomiting type     ED Discharge Orders         Ordered    ondansetron (ZOFRAN ODT) 4 MG disintegrating tablet  Every 8 hours PRN     02/05/19 0338           Note:  This document was prepared using Dragon voice recognition software and may include unintentional dictation errors.    Paulette Blanch, MD 02/05/19 [id number]

## 2019-02-05 NOTE — ED Triage Notes (Signed)
Pt arrives via [facility] with c/o N/V today. Per EMS, pt VS are WDL. Pt received 4 mg of Zofran in route to facility. Pt reports no pain at this time and reports nausea decrease. Pt is in NAD.

## 2019-02-05 NOTE — Discharge Instructions (Addendum)
1.  You may take Zofran as needed for nausea/vomiting. 2.  Eat a bland diet through the weekend, then slowly advance diet as tolerated. 3.  Return to the ER for worsening symptoms, persistent vomiting, difficulty breathing or other concerns.

## 2019-02-06 DIAGNOSIS — R112 Nausea with vomiting, unspecified: Secondary | ICD-10-CM | POA: Diagnosis not present

## 2019-02-06 DIAGNOSIS — R197 Diarrhea, unspecified: Secondary | ICD-10-CM | POA: Diagnosis not present

## 2019-02-06 DIAGNOSIS — R5381 Other malaise: Secondary | ICD-10-CM | POA: Diagnosis not present

## 2019-02-08 DIAGNOSIS — H8110 Benign paroxysmal vertigo, unspecified ear: Secondary | ICD-10-CM | POA: Diagnosis not present

## 2019-02-08 DIAGNOSIS — E1151 Type 2 diabetes mellitus with diabetic peripheral angiopathy without gangrene: Secondary | ICD-10-CM | POA: Diagnosis not present

## 2019-02-08 DIAGNOSIS — I4891 Unspecified atrial fibrillation: Secondary | ICD-10-CM | POA: Diagnosis not present

## 2019-02-08 DIAGNOSIS — I4892 Unspecified atrial flutter: Secondary | ICD-10-CM | POA: Diagnosis not present

## 2019-02-08 DIAGNOSIS — I482 Chronic atrial fibrillation, unspecified: Secondary | ICD-10-CM | POA: Diagnosis not present

## 2019-02-10 DIAGNOSIS — L821 Other seborrheic keratosis: Secondary | ICD-10-CM | POA: Diagnosis not present

## 2019-02-10 DIAGNOSIS — L812 Freckles: Secondary | ICD-10-CM | POA: Diagnosis not present

## 2019-02-10 DIAGNOSIS — D1800 Hemangioma unspecified site: Secondary | ICD-10-CM | POA: Diagnosis not present

## 2019-02-10 DIAGNOSIS — Z85820 Personal history of malignant melanoma of skin: Secondary | ICD-10-CM | POA: Diagnosis not present

## 2019-02-10 DIAGNOSIS — L578 Other skin changes due to chronic exposure to nonionizing radiation: Secondary | ICD-10-CM | POA: Diagnosis not present

## 2019-02-10 DIAGNOSIS — L570 Actinic keratosis: Secondary | ICD-10-CM | POA: Diagnosis not present

## 2019-02-21 DIAGNOSIS — I2510 Atherosclerotic heart disease of native coronary artery without angina pectoris: Secondary | ICD-10-CM | POA: Diagnosis not present

## 2019-02-21 DIAGNOSIS — I10 Essential (primary) hypertension: Secondary | ICD-10-CM | POA: Diagnosis not present

## 2019-02-21 DIAGNOSIS — I480 Paroxysmal atrial fibrillation: Secondary | ICD-10-CM | POA: Diagnosis not present

## 2019-02-21 DIAGNOSIS — I4892 Unspecified atrial flutter: Secondary | ICD-10-CM | POA: Diagnosis not present

## 2019-02-21 DIAGNOSIS — I4891 Unspecified atrial fibrillation: Secondary | ICD-10-CM | POA: Diagnosis not present

## 2019-02-21 DIAGNOSIS — E782 Mixed hyperlipidemia: Secondary | ICD-10-CM | POA: Diagnosis not present

## 2019-02-21 DIAGNOSIS — I420 Dilated cardiomyopathy: Secondary | ICD-10-CM | POA: Diagnosis not present

## 2019-02-21 DIAGNOSIS — Z951 Presence of aortocoronary bypass graft: Secondary | ICD-10-CM | POA: Diagnosis not present

## 2019-03-21 DIAGNOSIS — I480 Paroxysmal atrial fibrillation: Secondary | ICD-10-CM | POA: Diagnosis not present

## 2019-04-18 DIAGNOSIS — I480 Paroxysmal atrial fibrillation: Secondary | ICD-10-CM | POA: Diagnosis not present

## 2019-05-02 DIAGNOSIS — E1151 Type 2 diabetes mellitus with diabetic peripheral angiopathy without gangrene: Secondary | ICD-10-CM | POA: Diagnosis not present

## 2019-05-02 DIAGNOSIS — E782 Mixed hyperlipidemia: Secondary | ICD-10-CM | POA: Diagnosis not present

## 2019-05-02 DIAGNOSIS — E538 Deficiency of other specified B group vitamins: Secondary | ICD-10-CM | POA: Diagnosis not present

## 2019-05-09 DIAGNOSIS — E1151 Type 2 diabetes mellitus with diabetic peripheral angiopathy without gangrene: Secondary | ICD-10-CM | POA: Diagnosis not present

## 2019-05-09 DIAGNOSIS — Z0000 Encounter for general adult medical examination without abnormal findings: Secondary | ICD-10-CM | POA: Diagnosis not present

## 2019-05-09 DIAGNOSIS — I420 Dilated cardiomyopathy: Secondary | ICD-10-CM | POA: Diagnosis not present

## 2019-05-09 DIAGNOSIS — E538 Deficiency of other specified B group vitamins: Secondary | ICD-10-CM | POA: Diagnosis not present

## 2019-05-16 DIAGNOSIS — I480 Paroxysmal atrial fibrillation: Secondary | ICD-10-CM | POA: Diagnosis not present

## 2019-06-13 DIAGNOSIS — I480 Paroxysmal atrial fibrillation: Secondary | ICD-10-CM | POA: Diagnosis not present

## 2019-07-08 DIAGNOSIS — I4891 Unspecified atrial fibrillation: Secondary | ICD-10-CM | POA: Diagnosis not present

## 2019-07-08 DIAGNOSIS — I2510 Atherosclerotic heart disease of native coronary artery without angina pectoris: Secondary | ICD-10-CM | POA: Diagnosis not present

## 2019-07-08 DIAGNOSIS — I208 Other forms of angina pectoris: Secondary | ICD-10-CM | POA: Diagnosis not present

## 2019-07-08 DIAGNOSIS — I10 Essential (primary) hypertension: Secondary | ICD-10-CM | POA: Diagnosis not present

## 2019-07-08 DIAGNOSIS — Z951 Presence of aortocoronary bypass graft: Secondary | ICD-10-CM | POA: Diagnosis not present

## 2019-07-08 DIAGNOSIS — E782 Mixed hyperlipidemia: Secondary | ICD-10-CM | POA: Diagnosis not present

## 2019-07-08 DIAGNOSIS — I420 Dilated cardiomyopathy: Secondary | ICD-10-CM | POA: Diagnosis not present

## 2019-07-08 DIAGNOSIS — E119 Type 2 diabetes mellitus without complications: Secondary | ICD-10-CM | POA: Diagnosis not present

## 2019-07-08 DIAGNOSIS — I482 Chronic atrial fibrillation, unspecified: Secondary | ICD-10-CM | POA: Diagnosis not present

## 2019-07-11 DIAGNOSIS — I480 Paroxysmal atrial fibrillation: Secondary | ICD-10-CM | POA: Diagnosis not present

## 2019-08-08 DIAGNOSIS — I480 Paroxysmal atrial fibrillation: Secondary | ICD-10-CM | POA: Diagnosis not present

## 2019-08-09 DIAGNOSIS — E1151 Type 2 diabetes mellitus with diabetic peripheral angiopathy without gangrene: Secondary | ICD-10-CM | POA: Diagnosis not present

## 2019-08-09 DIAGNOSIS — R05 Cough: Secondary | ICD-10-CM | POA: Diagnosis not present

## 2019-08-11 DIAGNOSIS — Z20828 Contact with and (suspected) exposure to other viral communicable diseases: Secondary | ICD-10-CM | POA: Diagnosis not present

## 2019-08-22 DIAGNOSIS — E782 Mixed hyperlipidemia: Secondary | ICD-10-CM | POA: Diagnosis not present

## 2019-08-22 DIAGNOSIS — Z951 Presence of aortocoronary bypass graft: Secondary | ICD-10-CM | POA: Diagnosis not present

## 2019-08-22 DIAGNOSIS — I420 Dilated cardiomyopathy: Secondary | ICD-10-CM | POA: Diagnosis not present

## 2019-08-22 DIAGNOSIS — E1151 Type 2 diabetes mellitus with diabetic peripheral angiopathy without gangrene: Secondary | ICD-10-CM | POA: Diagnosis not present

## 2019-08-22 DIAGNOSIS — I10 Essential (primary) hypertension: Secondary | ICD-10-CM | POA: Diagnosis not present

## 2019-08-22 DIAGNOSIS — I4891 Unspecified atrial fibrillation: Secondary | ICD-10-CM | POA: Diagnosis not present

## 2019-08-22 DIAGNOSIS — I4892 Unspecified atrial flutter: Secondary | ICD-10-CM | POA: Diagnosis not present

## 2019-08-22 DIAGNOSIS — I2510 Atherosclerotic heart disease of native coronary artery without angina pectoris: Secondary | ICD-10-CM | POA: Diagnosis not present

## 2019-08-30 ENCOUNTER — Other Ambulatory Visit: Payer: Self-pay

## 2019-09-28 DIAGNOSIS — Z961 Presence of intraocular lens: Secondary | ICD-10-CM | POA: Diagnosis not present

## 2019-10-14 DIAGNOSIS — K921 Melena: Secondary | ICD-10-CM | POA: Diagnosis not present

## 2019-10-14 DIAGNOSIS — I482 Chronic atrial fibrillation, unspecified: Secondary | ICD-10-CM | POA: Diagnosis not present

## 2019-10-14 DIAGNOSIS — I252 Old myocardial infarction: Secondary | ICD-10-CM | POA: Diagnosis not present

## 2019-10-14 DIAGNOSIS — R197 Diarrhea, unspecified: Secondary | ICD-10-CM | POA: Diagnosis not present

## 2019-10-24 DIAGNOSIS — J45909 Unspecified asthma, uncomplicated: Secondary | ICD-10-CM | POA: Diagnosis not present

## 2019-10-24 DIAGNOSIS — B349 Viral infection, unspecified: Secondary | ICD-10-CM | POA: Diagnosis not present

## 2019-10-24 DIAGNOSIS — R0989 Other specified symptoms and signs involving the circulatory and respiratory systems: Secondary | ICD-10-CM | POA: Diagnosis not present

## 2019-11-01 DIAGNOSIS — K921 Melena: Secondary | ICD-10-CM | POA: Diagnosis not present

## 2019-11-02 DIAGNOSIS — E1151 Type 2 diabetes mellitus with diabetic peripheral angiopathy without gangrene: Secondary | ICD-10-CM | POA: Diagnosis not present

## 2019-11-02 DIAGNOSIS — E538 Deficiency of other specified B group vitamins: Secondary | ICD-10-CM | POA: Diagnosis not present

## 2019-11-02 DIAGNOSIS — I480 Paroxysmal atrial fibrillation: Secondary | ICD-10-CM | POA: Diagnosis not present

## 2019-11-09 DIAGNOSIS — E039 Hypothyroidism, unspecified: Secondary | ICD-10-CM | POA: Diagnosis not present

## 2019-11-09 DIAGNOSIS — E538 Deficiency of other specified B group vitamins: Secondary | ICD-10-CM | POA: Diagnosis not present

## 2019-11-09 DIAGNOSIS — I4891 Unspecified atrial fibrillation: Secondary | ICD-10-CM | POA: Diagnosis not present

## 2019-11-09 DIAGNOSIS — E119 Type 2 diabetes mellitus without complications: Secondary | ICD-10-CM | POA: Diagnosis not present

## 2019-11-16 DIAGNOSIS — Z23 Encounter for immunization: Secondary | ICD-10-CM | POA: Diagnosis not present

## 2019-11-30 DIAGNOSIS — I480 Paroxysmal atrial fibrillation: Secondary | ICD-10-CM | POA: Diagnosis not present

## 2019-12-28 DIAGNOSIS — I480 Paroxysmal atrial fibrillation: Secondary | ICD-10-CM | POA: Diagnosis not present

## 2020-01-13 DIAGNOSIS — Z20822 Contact with and (suspected) exposure to covid-19: Secondary | ICD-10-CM | POA: Diagnosis not present

## 2020-01-25 DIAGNOSIS — I482 Chronic atrial fibrillation, unspecified: Secondary | ICD-10-CM | POA: Diagnosis not present

## 2020-01-26 DIAGNOSIS — E1151 Type 2 diabetes mellitus with diabetic peripheral angiopathy without gangrene: Secondary | ICD-10-CM | POA: Diagnosis not present

## 2020-01-26 DIAGNOSIS — Z951 Presence of aortocoronary bypass graft: Secondary | ICD-10-CM | POA: Diagnosis not present

## 2020-01-26 DIAGNOSIS — I2510 Atherosclerotic heart disease of native coronary artery without angina pectoris: Secondary | ICD-10-CM | POA: Diagnosis not present

## 2020-01-26 DIAGNOSIS — E782 Mixed hyperlipidemia: Secondary | ICD-10-CM | POA: Diagnosis not present

## 2020-01-26 DIAGNOSIS — I10 Essential (primary) hypertension: Secondary | ICD-10-CM | POA: Diagnosis not present

## 2020-01-26 DIAGNOSIS — I420 Dilated cardiomyopathy: Secondary | ICD-10-CM | POA: Diagnosis not present

## 2020-02-27 DIAGNOSIS — Z7901 Long term (current) use of anticoagulants: Secondary | ICD-10-CM | POA: Diagnosis not present

## 2020-02-27 DIAGNOSIS — Z79899 Other long term (current) drug therapy: Secondary | ICD-10-CM | POA: Diagnosis not present

## 2020-02-27 DIAGNOSIS — I509 Heart failure, unspecified: Secondary | ICD-10-CM | POA: Diagnosis not present

## 2020-02-27 DIAGNOSIS — I482 Chronic atrial fibrillation, unspecified: Secondary | ICD-10-CM | POA: Diagnosis not present

## 2020-03-08 DIAGNOSIS — I4891 Unspecified atrial fibrillation: Secondary | ICD-10-CM | POA: Diagnosis not present

## 2020-03-08 DIAGNOSIS — I509 Heart failure, unspecified: Secondary | ICD-10-CM | POA: Diagnosis not present

## 2020-03-08 DIAGNOSIS — E119 Type 2 diabetes mellitus without complications: Secondary | ICD-10-CM | POA: Diagnosis not present

## 2020-03-08 DIAGNOSIS — Z7901 Long term (current) use of anticoagulants: Secondary | ICD-10-CM | POA: Diagnosis not present

## 2020-03-08 DIAGNOSIS — E1151 Type 2 diabetes mellitus with diabetic peripheral angiopathy without gangrene: Secondary | ICD-10-CM | POA: Diagnosis not present

## 2020-03-12 DIAGNOSIS — I420 Dilated cardiomyopathy: Secondary | ICD-10-CM | POA: Diagnosis not present

## 2020-03-12 DIAGNOSIS — I5021 Acute systolic (congestive) heart failure: Secondary | ICD-10-CM | POA: Diagnosis not present

## 2020-04-11 DIAGNOSIS — I482 Chronic atrial fibrillation, unspecified: Secondary | ICD-10-CM | POA: Diagnosis not present

## 2020-04-26 DIAGNOSIS — I509 Heart failure, unspecified: Secondary | ICD-10-CM | POA: Diagnosis not present

## 2020-04-26 DIAGNOSIS — R040 Epistaxis: Secondary | ICD-10-CM | POA: Diagnosis not present

## 2020-04-26 DIAGNOSIS — I4891 Unspecified atrial fibrillation: Secondary | ICD-10-CM | POA: Diagnosis not present

## 2020-04-26 DIAGNOSIS — R5383 Other fatigue: Secondary | ICD-10-CM | POA: Diagnosis not present

## 2020-05-02 DIAGNOSIS — Z79899 Other long term (current) drug therapy: Secondary | ICD-10-CM | POA: Diagnosis not present

## 2020-05-02 DIAGNOSIS — E1151 Type 2 diabetes mellitus with diabetic peripheral angiopathy without gangrene: Secondary | ICD-10-CM | POA: Diagnosis not present

## 2020-05-02 DIAGNOSIS — D500 Iron deficiency anemia secondary to blood loss (chronic): Secondary | ICD-10-CM | POA: Diagnosis not present

## 2020-05-02 DIAGNOSIS — I509 Heart failure, unspecified: Secondary | ICD-10-CM | POA: Diagnosis not present

## 2020-05-02 DIAGNOSIS — I2609 Other pulmonary embolism with acute cor pulmonale: Secondary | ICD-10-CM | POA: Insufficient documentation

## 2020-05-09 DIAGNOSIS — I38 Endocarditis, valve unspecified: Secondary | ICD-10-CM | POA: Diagnosis not present

## 2020-05-09 DIAGNOSIS — Z79899 Other long term (current) drug therapy: Secondary | ICD-10-CM | POA: Diagnosis not present

## 2020-05-09 DIAGNOSIS — E785 Hyperlipidemia, unspecified: Secondary | ICD-10-CM | POA: Diagnosis not present

## 2020-05-09 DIAGNOSIS — Z7901 Long term (current) use of anticoagulants: Secondary | ICD-10-CM | POA: Diagnosis not present

## 2020-05-09 DIAGNOSIS — D509 Iron deficiency anemia, unspecified: Secondary | ICD-10-CM | POA: Diagnosis not present

## 2020-05-09 DIAGNOSIS — I482 Chronic atrial fibrillation, unspecified: Secondary | ICD-10-CM | POA: Diagnosis not present

## 2020-05-09 DIAGNOSIS — I509 Heart failure, unspecified: Secondary | ICD-10-CM | POA: Diagnosis not present

## 2020-05-09 DIAGNOSIS — Z0000 Encounter for general adult medical examination without abnormal findings: Secondary | ICD-10-CM | POA: Diagnosis not present

## 2020-05-09 DIAGNOSIS — I081 Rheumatic disorders of both mitral and tricuspid valves: Secondary | ICD-10-CM | POA: Diagnosis not present

## 2020-05-22 DIAGNOSIS — R040 Epistaxis: Secondary | ICD-10-CM | POA: Diagnosis not present

## 2020-05-22 DIAGNOSIS — H6123 Impacted cerumen, bilateral: Secondary | ICD-10-CM | POA: Diagnosis not present

## 2020-05-22 DIAGNOSIS — H04229 Epiphora due to insufficient drainage, unspecified lacrimal gland: Secondary | ICD-10-CM | POA: Diagnosis not present

## 2020-05-23 DIAGNOSIS — I480 Paroxysmal atrial fibrillation: Secondary | ICD-10-CM | POA: Diagnosis not present

## 2020-05-23 DIAGNOSIS — E1151 Type 2 diabetes mellitus with diabetic peripheral angiopathy without gangrene: Secondary | ICD-10-CM | POA: Diagnosis not present

## 2020-05-23 DIAGNOSIS — I2609 Other pulmonary embolism with acute cor pulmonale: Secondary | ICD-10-CM | POA: Diagnosis not present

## 2020-05-23 DIAGNOSIS — I4891 Unspecified atrial fibrillation: Secondary | ICD-10-CM | POA: Diagnosis not present

## 2020-05-23 DIAGNOSIS — Z951 Presence of aortocoronary bypass graft: Secondary | ICD-10-CM | POA: Diagnosis not present

## 2020-05-23 DIAGNOSIS — I2510 Atherosclerotic heart disease of native coronary artery without angina pectoris: Secondary | ICD-10-CM | POA: Diagnosis not present

## 2020-05-23 DIAGNOSIS — I10 Essential (primary) hypertension: Secondary | ICD-10-CM | POA: Diagnosis not present

## 2020-05-23 DIAGNOSIS — E782 Mixed hyperlipidemia: Secondary | ICD-10-CM | POA: Diagnosis not present

## 2020-05-23 DIAGNOSIS — I420 Dilated cardiomyopathy: Secondary | ICD-10-CM | POA: Diagnosis not present

## 2020-06-13 DIAGNOSIS — I480 Paroxysmal atrial fibrillation: Secondary | ICD-10-CM | POA: Diagnosis not present

## 2020-06-20 DIAGNOSIS — Z79899 Other long term (current) drug therapy: Secondary | ICD-10-CM | POA: Diagnosis not present

## 2020-06-20 DIAGNOSIS — D509 Iron deficiency anemia, unspecified: Secondary | ICD-10-CM | POA: Diagnosis not present

## 2020-06-20 DIAGNOSIS — I429 Cardiomyopathy, unspecified: Secondary | ICD-10-CM | POA: Diagnosis not present

## 2020-06-20 DIAGNOSIS — I482 Chronic atrial fibrillation, unspecified: Secondary | ICD-10-CM | POA: Diagnosis not present

## 2020-06-20 DIAGNOSIS — I509 Heart failure, unspecified: Secondary | ICD-10-CM | POA: Diagnosis not present

## 2020-07-05 DIAGNOSIS — Z961 Presence of intraocular lens: Secondary | ICD-10-CM | POA: Diagnosis not present

## 2020-07-11 DIAGNOSIS — I480 Paroxysmal atrial fibrillation: Secondary | ICD-10-CM | POA: Diagnosis not present

## 2020-08-08 DIAGNOSIS — I480 Paroxysmal atrial fibrillation: Secondary | ICD-10-CM | POA: Diagnosis not present

## 2020-09-05 DIAGNOSIS — I480 Paroxysmal atrial fibrillation: Secondary | ICD-10-CM | POA: Diagnosis not present

## 2020-09-19 DIAGNOSIS — I482 Chronic atrial fibrillation, unspecified: Secondary | ICD-10-CM | POA: Diagnosis not present

## 2020-09-19 DIAGNOSIS — Z23 Encounter for immunization: Secondary | ICD-10-CM | POA: Diagnosis not present

## 2020-09-19 DIAGNOSIS — I509 Heart failure, unspecified: Secondary | ICD-10-CM | POA: Diagnosis not present

## 2020-09-19 DIAGNOSIS — I480 Paroxysmal atrial fibrillation: Secondary | ICD-10-CM | POA: Diagnosis not present

## 2020-09-19 DIAGNOSIS — E119 Type 2 diabetes mellitus without complications: Secondary | ICD-10-CM | POA: Diagnosis not present

## 2020-09-24 DIAGNOSIS — I2609 Other pulmonary embolism with acute cor pulmonale: Secondary | ICD-10-CM | POA: Diagnosis not present

## 2020-09-24 DIAGNOSIS — E782 Mixed hyperlipidemia: Secondary | ICD-10-CM | POA: Diagnosis not present

## 2020-09-24 DIAGNOSIS — I420 Dilated cardiomyopathy: Secondary | ICD-10-CM | POA: Diagnosis not present

## 2020-09-24 DIAGNOSIS — I2510 Atherosclerotic heart disease of native coronary artery without angina pectoris: Secondary | ICD-10-CM | POA: Diagnosis not present

## 2020-09-24 DIAGNOSIS — E1151 Type 2 diabetes mellitus with diabetic peripheral angiopathy without gangrene: Secondary | ICD-10-CM | POA: Diagnosis not present

## 2020-09-24 DIAGNOSIS — I10 Essential (primary) hypertension: Secondary | ICD-10-CM | POA: Diagnosis not present

## 2020-09-24 DIAGNOSIS — I872 Venous insufficiency (chronic) (peripheral): Secondary | ICD-10-CM | POA: Diagnosis not present

## 2020-09-24 DIAGNOSIS — I4891 Unspecified atrial fibrillation: Secondary | ICD-10-CM | POA: Diagnosis not present

## 2020-09-24 DIAGNOSIS — Z951 Presence of aortocoronary bypass graft: Secondary | ICD-10-CM | POA: Diagnosis not present

## 2020-10-03 DIAGNOSIS — I482 Chronic atrial fibrillation, unspecified: Secondary | ICD-10-CM | POA: Diagnosis not present

## 2020-10-31 DIAGNOSIS — I480 Paroxysmal atrial fibrillation: Secondary | ICD-10-CM | POA: Diagnosis not present

## 2020-11-23 DIAGNOSIS — M7732 Calcaneal spur, left foot: Secondary | ICD-10-CM | POA: Diagnosis not present

## 2020-11-23 DIAGNOSIS — S99922A Unspecified injury of left foot, initial encounter: Secondary | ICD-10-CM | POA: Diagnosis not present

## 2020-11-23 DIAGNOSIS — L03116 Cellulitis of left lower limb: Secondary | ICD-10-CM | POA: Diagnosis not present

## 2020-11-26 DIAGNOSIS — I429 Cardiomyopathy, unspecified: Secondary | ICD-10-CM | POA: Diagnosis not present

## 2020-11-26 DIAGNOSIS — L03116 Cellulitis of left lower limb: Secondary | ICD-10-CM | POA: Diagnosis not present

## 2020-12-03 DIAGNOSIS — R600 Localized edema: Secondary | ICD-10-CM | POA: Diagnosis not present

## 2020-12-03 DIAGNOSIS — L03116 Cellulitis of left lower limb: Secondary | ICD-10-CM | POA: Diagnosis not present

## 2020-12-06 DIAGNOSIS — I872 Venous insufficiency (chronic) (peripheral): Secondary | ICD-10-CM | POA: Diagnosis not present

## 2020-12-10 DIAGNOSIS — I482 Chronic atrial fibrillation, unspecified: Secondary | ICD-10-CM | POA: Diagnosis not present

## 2020-12-10 DIAGNOSIS — L03116 Cellulitis of left lower limb: Secondary | ICD-10-CM | POA: Diagnosis not present

## 2020-12-17 DIAGNOSIS — I872 Venous insufficiency (chronic) (peripheral): Secondary | ICD-10-CM | POA: Diagnosis not present

## 2020-12-24 DIAGNOSIS — I429 Cardiomyopathy, unspecified: Secondary | ICD-10-CM | POA: Diagnosis not present

## 2020-12-24 DIAGNOSIS — I872 Venous insufficiency (chronic) (peripheral): Secondary | ICD-10-CM | POA: Diagnosis not present

## 2021-01-07 DIAGNOSIS — I4891 Unspecified atrial fibrillation: Secondary | ICD-10-CM | POA: Diagnosis not present

## 2021-01-07 DIAGNOSIS — R058 Other specified cough: Secondary | ICD-10-CM | POA: Diagnosis not present

## 2021-01-07 DIAGNOSIS — Z20822 Contact with and (suspected) exposure to covid-19: Secondary | ICD-10-CM | POA: Diagnosis not present

## 2021-01-07 DIAGNOSIS — J069 Acute upper respiratory infection, unspecified: Secondary | ICD-10-CM | POA: Diagnosis not present

## 2021-01-07 DIAGNOSIS — I429 Cardiomyopathy, unspecified: Secondary | ICD-10-CM | POA: Diagnosis not present

## 2021-01-17 DIAGNOSIS — M5383 Other specified dorsopathies, cervicothoracic region: Secondary | ICD-10-CM | POA: Diagnosis not present

## 2021-01-17 DIAGNOSIS — M9901 Segmental and somatic dysfunction of cervical region: Secondary | ICD-10-CM | POA: Diagnosis not present

## 2021-01-17 DIAGNOSIS — M9902 Segmental and somatic dysfunction of thoracic region: Secondary | ICD-10-CM | POA: Diagnosis not present

## 2021-01-21 DIAGNOSIS — I480 Paroxysmal atrial fibrillation: Secondary | ICD-10-CM | POA: Diagnosis not present

## 2021-01-21 DIAGNOSIS — M5383 Other specified dorsopathies, cervicothoracic region: Secondary | ICD-10-CM | POA: Diagnosis not present

## 2021-01-21 DIAGNOSIS — M9902 Segmental and somatic dysfunction of thoracic region: Secondary | ICD-10-CM | POA: Diagnosis not present

## 2021-01-21 DIAGNOSIS — I4891 Unspecified atrial fibrillation: Secondary | ICD-10-CM | POA: Diagnosis not present

## 2021-01-21 DIAGNOSIS — I2609 Other pulmonary embolism with acute cor pulmonale: Secondary | ICD-10-CM | POA: Diagnosis not present

## 2021-01-21 DIAGNOSIS — I2510 Atherosclerotic heart disease of native coronary artery without angina pectoris: Secondary | ICD-10-CM | POA: Diagnosis not present

## 2021-01-21 DIAGNOSIS — E1151 Type 2 diabetes mellitus with diabetic peripheral angiopathy without gangrene: Secondary | ICD-10-CM | POA: Diagnosis not present

## 2021-01-21 DIAGNOSIS — Z951 Presence of aortocoronary bypass graft: Secondary | ICD-10-CM | POA: Diagnosis not present

## 2021-01-21 DIAGNOSIS — M9901 Segmental and somatic dysfunction of cervical region: Secondary | ICD-10-CM | POA: Diagnosis not present

## 2021-01-21 DIAGNOSIS — I420 Dilated cardiomyopathy: Secondary | ICD-10-CM | POA: Diagnosis not present

## 2021-01-21 DIAGNOSIS — E782 Mixed hyperlipidemia: Secondary | ICD-10-CM | POA: Diagnosis not present

## 2021-01-21 DIAGNOSIS — I10 Essential (primary) hypertension: Secondary | ICD-10-CM | POA: Diagnosis not present

## 2021-01-22 DIAGNOSIS — I4891 Unspecified atrial fibrillation: Secondary | ICD-10-CM | POA: Diagnosis not present

## 2021-01-29 DIAGNOSIS — M5383 Other specified dorsopathies, cervicothoracic region: Secondary | ICD-10-CM | POA: Diagnosis not present

## 2021-01-29 DIAGNOSIS — M9902 Segmental and somatic dysfunction of thoracic region: Secondary | ICD-10-CM | POA: Diagnosis not present

## 2021-01-29 DIAGNOSIS — M9901 Segmental and somatic dysfunction of cervical region: Secondary | ICD-10-CM | POA: Diagnosis not present

## 2021-01-30 DIAGNOSIS — R21 Rash and other nonspecific skin eruption: Secondary | ICD-10-CM | POA: Diagnosis not present

## 2021-01-30 DIAGNOSIS — T7840XA Allergy, unspecified, initial encounter: Secondary | ICD-10-CM | POA: Diagnosis not present

## 2021-02-05 DIAGNOSIS — M5383 Other specified dorsopathies, cervicothoracic region: Secondary | ICD-10-CM | POA: Diagnosis not present

## 2021-02-05 DIAGNOSIS — M9902 Segmental and somatic dysfunction of thoracic region: Secondary | ICD-10-CM | POA: Diagnosis not present

## 2021-02-05 DIAGNOSIS — M9901 Segmental and somatic dysfunction of cervical region: Secondary | ICD-10-CM | POA: Diagnosis not present

## 2021-02-05 DIAGNOSIS — I480 Paroxysmal atrial fibrillation: Secondary | ICD-10-CM | POA: Diagnosis not present

## 2021-02-07 ENCOUNTER — Other Ambulatory Visit (INDEPENDENT_AMBULATORY_CARE_PROVIDER_SITE_OTHER): Payer: Self-pay | Admitting: Vascular Surgery

## 2021-02-07 DIAGNOSIS — I998 Other disorder of circulatory system: Secondary | ICD-10-CM

## 2021-02-11 ENCOUNTER — Other Ambulatory Visit: Payer: Self-pay

## 2021-02-11 ENCOUNTER — Ambulatory Visit (INDEPENDENT_AMBULATORY_CARE_PROVIDER_SITE_OTHER): Payer: Medicare HMO

## 2021-02-11 ENCOUNTER — Ambulatory Visit (INDEPENDENT_AMBULATORY_CARE_PROVIDER_SITE_OTHER): Payer: Medicare HMO | Admitting: Vascular Surgery

## 2021-02-11 ENCOUNTER — Encounter (INDEPENDENT_AMBULATORY_CARE_PROVIDER_SITE_OTHER): Payer: Self-pay | Admitting: Vascular Surgery

## 2021-02-11 VITALS — BP 109/60 | HR 79 | Resp 16 | Wt 164.4 lb

## 2021-02-11 DIAGNOSIS — I70213 Atherosclerosis of native arteries of extremities with intermittent claudication, bilateral legs: Secondary | ICD-10-CM | POA: Diagnosis not present

## 2021-02-11 DIAGNOSIS — E782 Mixed hyperlipidemia: Secondary | ICD-10-CM

## 2021-02-11 DIAGNOSIS — I4891 Unspecified atrial fibrillation: Secondary | ICD-10-CM

## 2021-02-11 DIAGNOSIS — I998 Other disorder of circulatory system: Secondary | ICD-10-CM

## 2021-02-11 DIAGNOSIS — I25118 Atherosclerotic heart disease of native coronary artery with other forms of angina pectoris: Secondary | ICD-10-CM

## 2021-02-11 DIAGNOSIS — H25019 Cortical age-related cataract, unspecified eye: Secondary | ICD-10-CM | POA: Insufficient documentation

## 2021-02-11 DIAGNOSIS — I872 Venous insufficiency (chronic) (peripheral): Secondary | ICD-10-CM | POA: Diagnosis not present

## 2021-02-18 ENCOUNTER — Encounter (INDEPENDENT_AMBULATORY_CARE_PROVIDER_SITE_OTHER): Payer: Self-pay | Admitting: Vascular Surgery

## 2021-02-18 DIAGNOSIS — I70219 Atherosclerosis of native arteries of extremities with intermittent claudication, unspecified extremity: Secondary | ICD-10-CM | POA: Insufficient documentation

## 2021-02-18 NOTE — Progress Notes (Signed)
MRN : [id number]  [name] [name] is a 87 y.o. ([birth year]) male who presents with chief complaint of  Chief Complaint  Patient presents with   New Patient (Initial Visit)    Ref Sabra [place] lle ischemia  .  History of Present Illness:    The patient is seen for evaluation of painful lower extremities and diminished pulses. Patient notes the pain is always associated with activity and is very consistent day today. Typically, the pain occurs at less than one block, progress is as activity continues to the point that the patient must stop walking. Resting including standing still for several minutes allowed resumption of the activity and the ability to walk a similar distance before stopping again. Uneven terrain and inclined shorten the distance.   The patient denies rest pain or dangling of an extremity off the side of the bed during the night for relief. No open wounds or sores at this time. No prior interventions or surgeries.  No history of back problems or DJD of the lumbar sacral spine.   The patient denies changes in claudication symptoms or new rest pain symptoms.  No new ulcers or wounds of the foot.  The patient's blood pressure has been stable and relatively well controlled. The patient denies amaurosis fugax or recent TIA symptoms. There are no recent neurological changes noted. The patient denies history of DVT, PE or superficial thrombophlebitis. The patient denies recent episodes of angina or shortness of breath.   Current Meds  Medication Sig   ALPRAZolam (XANAX) 0.25 MG tablet    Cyanocobalamin (VITAMIN B-12) 1000 MCG SUBL Take by mouth.   furosemide (LASIX) 20 MG tablet Take 2 tablets by mouth.   Iron-Vitamin C (VITRON-C) 65-125 MG TABS Take 1 tablet by mouth once a week.   levothyroxine (SYNTHROID, LEVOTHROID) 112 MCG tablet    metoprolol succinate (TOPROL-XL) 50 MG 24 hr tablet    Multiple Vitamins-Minerals (MULTIVITAMIN WITH MINERALS) tablet Take by  mouth.   nizatidine (AXID) 150 MG capsule Take 150 mg by mouth 2 (two) times daily.   Omega-3 Fatty Acids (FISH OIL) 1000 MG CAPS Take by mouth daily.   polyethylene glycol (MIRALAX / GLYCOLAX) packet Take 17 g by mouth daily. Mix one tablespoon with 8oz of your favorite juice or water every day until you are having soft formed stools. Then start taking once daily if you didn't have a stool the day before.   potassium chloride (K-DUR,KLOR-CON) 10 MEQ tablet Take by mouth.   simvastatin (ZOCOR) 40 MG tablet    warfarin (COUMADIN) 5 MG tablet 5 mg at bedtime.    Past Medical History:  Diagnosis Date   A-fib ([place])    B12 deficiency    Coronary artery disease    HTN (hypertension), benign 04/25/2014   Iron deficiency anemia    Thyroid disease    Type 2 diabetes mellitus with peripheral angiopathy ([place]) 11/10/2018    Past Surgical History:  Procedure Laterality Date   CARDIAC SURGERY     HERNIA REPAIR      Social History Social History   Tobacco Use   Smoking status: Never Smoker   Smokeless tobacco: Never Used  Substance Use Topics   Alcohol use: No   Drug use: No    Family History Family History  Problem Relation Age of Onset   Hypertension Mother    Diabetes Brother    Stroke Brother    Heart attack Brother   No family history of bleeding/clotting disorders,  porphyria or autoimmune disease   Allergies  Allergen Reactions   Cephalexin Rash   Spironolactone Nausea Only   Codeine Other (See Comments) and Rash    Can not sleep Other reaction(s): Other (See Comments) Can't sleep   Doxycycline Rash     REVIEW OF SYSTEMS (Negative unless checked)  Constitutional: [] Weight loss  [] Fever  [] Chills Cardiac: [] Chest pain   [] Chest pressure   [] Palpitations   [] Shortness of breath when laying flat   [] Shortness of breath with exertion. Vascular:  [x] Pain in legs with walking   [x] Pain in legs at rest  [] History of DVT   [] Phlebitis    [] Swelling in legs   [] Varicose veins   [] Non-healing ulcers Pulmonary:   [] Uses home oxygen   [] Productive cough   [] Hemoptysis   [] Wheeze  [] COPD   [] Asthma Neurologic:  [] Dizziness   [] Seizures   [] History of stroke   [] History of TIA  [] Aphasia   [] Vissual changes   [] Weakness or numbness in arm   [] Weakness or numbness in leg Musculoskeletal:   [] Joint swelling   [] Joint pain   [] Low back pain Hematologic:  [] Easy bruising  [] Easy bleeding   [] Hypercoagulable state   [] Anemic Gastrointestinal:  [] Diarrhea   [] Vomiting  [] Gastroesophageal reflux/heartburn   [] Difficulty swallowing. Genitourinary:  [] Chronic kidney disease   [] Difficult urination  [] Frequent urination   [] Blood in urine Skin:  [] Rashes   [] Ulcers  Psychological:  [] History of anxiety   []  History of major depression.  Physical Examination  Vitals:   02/11/21 1350  BP: 109/60  Pulse: 79  Resp: 16  Weight: 164 lb 6.4 oz (74.6 kg)   Body mass index is 25 kg/m. Gen: WD/WN, NAD Head: [place]/AT, No temporalis wasting.  Ear/Nose/Throat: Hearing grossly intact, nares w/o erythema or drainage, poor dentition Eyes: PER, EOMI, sclera nonicteric.  Neck: Supple, no masses.  No bruit or JVD.  Pulmonary:  Good air movement, clear to auscultation bilaterally, no use of accessory muscles.  Cardiac: RRR, normal S1, S2, no Murmurs. Vascular:  Vessel Right Left  Radial Palpable Palpable  PT Not Palpable Not Palpable  DP Not Palpable Not Palpable  Gastrointestinal: soft, non-distended. No guarding/no peritoneal signs.  Musculoskeletal: M/S 5/5 throughout.  No deformity or atrophy.  Neurologic: CN 2-12 intact. Pain and light touch intact in extremities.  Symmetrical.  Speech is fluent. Motor exam as listed above. Psychiatric: Judgment intact, Mood & affect appropriate for pt's clinical situation. Dermatologic: No rashes or ulcers noted.  No changes consistent with cellulitis. or skin changes of chronic lymphedema.  CBC Lab Results   Component Value Date   WBC 10.5 02/05/2019   HGB 11.6 (L) 02/05/2019   HCT 33.8 (L) 02/05/2019   MCV 101.5 (H) 02/05/2019   PLT 152 02/05/2019    BMET    Component Value Date/Time   NA 138 02/05/2019 0121   NA 141 02/04/2015 1001   K 4.3 02/05/2019 0121   K 4.2 02/04/2015 1001   CL 105 02/05/2019 0121   CL 106 02/04/2015 1001   CO2 26 02/05/2019 0121   CO2 30 02/04/2015 1001   GLUCOSE 139 (H) 02/05/2019 0121   GLUCOSE 122 (H) 02/04/2015 1001   BUN 28 (H) 02/05/2019 0121   BUN 19 (H) 02/04/2015 1001   CREATININE 1.06 02/05/2019 0121   CREATININE 1.26 02/04/2015 1001   CALCIUM 8.9 02/05/2019 0121   CALCIUM 8.5 02/04/2015 1001   GFRNONAA >60 02/05/2019 0121   GFRNONAA 58 (L) 02/04/2015 1001  GFRNONAA >60 11/06/2013 1304   GFRAA >60 02/05/2019 0121   GFRAA >60 02/04/2015 1001   GFRAA >60 11/06/2013 1304   CrCl cannot be calculated (Patient's most recent lab result is older than the maximum 21 days allowed.).  COAG Lab Results  Component Value Date   INR 2.99 02/05/2019   INR 1.4 02/04/2015   INR 1.8 11/06/2013    Radiology VAS [place] ABI WITH/WO TBI  Result Date: 02/11/2021 LOWER EXTREMITY DOPPLER STUDY Indications: Peripheral artery disease, and reoccuring annual left medial foot              wound.  Comparison Study: 07/08/2017 Performing Technologist: Concha Norway RVT  Examination Guidelines: A complete evaluation includes at minimum, Doppler waveform signals and systolic blood pressure reading at the level of bilateral brachial, anterior tibial, and posterior tibial arteries, when vessel segments are accessible. Bilateral testing is considered an integral part of a complete examination. Photoelectric Plethysmograph (PPG) waveforms and toe systolic pressure readings are included as required and additional duplex testing as needed. Limited examinations for reoccurring indications may be performed as noted.  ABI Findings:  +---------+------------------+-----+----------+--------+  Right     Rt Pressure (mmHg) Index Waveform   Comment   +---------+------------------+-----+----------+--------+  Brachial  111                                           +---------+------------------+-----+----------+--------+  ATA                                monophasic [place]        +---------+------------------+-----+----------+--------+  PTA                                biphasic   [place]        +---------+------------------+-----+----------+--------+  Great Toe 113                1.02  Normal               +---------+------------------+-----+----------+--------+ +---------+------------------+-----+----------+-------+  Left      Lt Pressure (mmHg) Index Waveform   Comment  +---------+------------------+-----+----------+-------+  Brachial  110                                          +---------+------------------+-----+----------+-------+  ATA                                biphasic   [place]       +---------+------------------+-----+----------+-------+  PTA                                monophasic [place]       +---------+------------------+-----+----------+-------+  Great Toe 77                 0.69  Abnormal            +---------+------------------+-----+----------+-------+ +-------+-----------+-----------+------------+------------+  ABI/TBI Today's ABI Today's TBI Previous ABI Previous TBI  +-------+-----------+-----------+------------+------------+  Right   [place]          1.02  [place]           .80           +-------+-----------+-----------+------------+------------+  Left    [place]          .69                    .54           +-------+-----------+-----------+------------+------------+ Compared to prior study on 2018.  Summary: Right: Resting right ankle-brachial index indicates noncompressible right lower extremity arteries. The right toe-brachial index is normal. Left: Resting left ankle-brachial index indicates noncompressible left lower extremity arteries. The  left toe-brachial index is normal.  *See table(s) above for measurements and observations.  Electronically signed by Hortencia Pilar MD on 02/11/2021 at 5:06:26 PM.    Final      Assessment/Plan 1. Atherosclerosis of native artery of both lower extremities with intermittent claudication (HCC)  Recommend:  The patient has evidence of atherosclerosis of the lower extremities with claudication.  The patient does not voice lifestyle limiting changes at this point in time.  Noninvasive studies do not suggest clinically significant change.  No invasive studies, angiography or surgery at this time The patient should continue walking and begin a more formal exercise program.  The patient should continue antiplatelet therapy and aggressive treatment of the lipid abnormalities  No changes in the patient's medications at this time  The patient requests follow up PRN  2. Chronic venous insufficiency No surgery or intervention at this point in time.  I have reviewed my discussion with the patient regarding venous insufficiency and why it causes symptoms. I have discussed with the patient the chronic skin changes that accompany venous insufficiency and the long term sequela such as ulceration. Patient will contnue wearing graduated compression stockings on a daily basis, as this has provided excellent control of his edema. The patient will put the stockings on first thing in the morning and removing them in the evening. The patient is reminded not to sleep in the stockings.  In addition, behavioral modification including elevation during the day will be initiated. Exercise is strongly encouraged.  Given the patient's good control and lack of any problems regarding the venous insufficiency and lymphedema a lymph pump in not need at this time.  The patient will follow up with me PRN should anything change.  The patient voices agreement with this plan.   3. Atrial fibrillation, unspecified type  ([place]) Continue antiarrhythmia medications as already ordered, these medications have been reviewed and there are no changes at this time.  Continue anticoagulation as ordered by Cardiology Service   4. Coronary artery disease of native artery of native heart with stable angina pectoris (HCC) Continue cardiac and antihypertensive medications as already ordered and reviewed, no changes at this time.  Continue statin as ordered and reviewed, no changes at this time  Nitrates PRN for chest pain   5. Mixed hyperlipidemia Continue statin as ordered and reviewed, no changes at this time     Hortencia Pilar, MD  02/18/2021 12:48 PM

## 2021-02-19 DIAGNOSIS — M9902 Segmental and somatic dysfunction of thoracic region: Secondary | ICD-10-CM | POA: Diagnosis not present

## 2021-02-19 DIAGNOSIS — M9901 Segmental and somatic dysfunction of cervical region: Secondary | ICD-10-CM | POA: Diagnosis not present

## 2021-03-05 DIAGNOSIS — I482 Chronic atrial fibrillation, unspecified: Secondary | ICD-10-CM | POA: Diagnosis not present

## 2021-03-05 DIAGNOSIS — E1151 Type 2 diabetes mellitus with diabetic peripheral angiopathy without gangrene: Secondary | ICD-10-CM | POA: Diagnosis not present

## 2021-03-05 DIAGNOSIS — E538 Deficiency of other specified B group vitamins: Secondary | ICD-10-CM | POA: Diagnosis not present

## 2021-04-02 DIAGNOSIS — I482 Chronic atrial fibrillation, unspecified: Secondary | ICD-10-CM | POA: Diagnosis not present

## 2021-04-23 DIAGNOSIS — Z20822 Contact with and (suspected) exposure to covid-19: Secondary | ICD-10-CM | POA: Diagnosis not present

## 2021-04-23 DIAGNOSIS — E119 Type 2 diabetes mellitus without complications: Secondary | ICD-10-CM | POA: Diagnosis not present

## 2021-04-23 DIAGNOSIS — Z03818 Encounter for observation for suspected exposure to other biological agents ruled out: Secondary | ICD-10-CM | POA: Diagnosis not present

## 2021-04-23 DIAGNOSIS — J45909 Unspecified asthma, uncomplicated: Secondary | ICD-10-CM | POA: Diagnosis not present

## 2021-04-24 DIAGNOSIS — I482 Chronic atrial fibrillation, unspecified: Secondary | ICD-10-CM | POA: Diagnosis not present

## 2021-05-09 DIAGNOSIS — I480 Paroxysmal atrial fibrillation: Secondary | ICD-10-CM | POA: Diagnosis not present

## 2021-05-24 DIAGNOSIS — I482 Chronic atrial fibrillation, unspecified: Secondary | ICD-10-CM | POA: Diagnosis not present

## 2021-05-24 DIAGNOSIS — E119 Type 2 diabetes mellitus without complications: Secondary | ICD-10-CM | POA: Diagnosis not present

## 2021-05-24 DIAGNOSIS — Z0000 Encounter for general adult medical examination without abnormal findings: Secondary | ICD-10-CM | POA: Diagnosis not present

## 2021-06-04 DIAGNOSIS — I4891 Unspecified atrial fibrillation: Secondary | ICD-10-CM | POA: Diagnosis not present

## 2021-06-04 DIAGNOSIS — S80862A Insect bite (nonvenomous), left lower leg, initial encounter: Secondary | ICD-10-CM | POA: Diagnosis not present

## 2021-06-04 DIAGNOSIS — S40861A Insect bite (nonvenomous) of right upper arm, initial encounter: Secondary | ICD-10-CM | POA: Diagnosis not present

## 2021-06-04 DIAGNOSIS — R252 Cramp and spasm: Secondary | ICD-10-CM | POA: Diagnosis not present

## 2021-06-04 DIAGNOSIS — I4892 Unspecified atrial flutter: Secondary | ICD-10-CM | POA: Diagnosis not present

## 2021-06-04 DIAGNOSIS — S80861A Insect bite (nonvenomous), right lower leg, initial encounter: Secondary | ICD-10-CM | POA: Diagnosis not present

## 2021-06-04 DIAGNOSIS — S40862A Insect bite (nonvenomous) of left upper arm, initial encounter: Secondary | ICD-10-CM | POA: Diagnosis not present

## 2021-06-06 DIAGNOSIS — I4891 Unspecified atrial fibrillation: Secondary | ICD-10-CM | POA: Diagnosis not present

## 2021-06-06 DIAGNOSIS — I4892 Unspecified atrial flutter: Secondary | ICD-10-CM | POA: Diagnosis not present

## 2021-06-13 DIAGNOSIS — R21 Rash and other nonspecific skin eruption: Secondary | ICD-10-CM | POA: Diagnosis not present

## 2021-06-29 ENCOUNTER — Emergency Department
Admission: EM | Admit: 2021-06-29 | Discharge: 2021-06-29 | Disposition: A | Discharge status: Home / Self Care | Payer: Medicare HMO | Attending: Emergency Medicine | Admitting: Emergency Medicine

## 2021-06-29 ENCOUNTER — Other Ambulatory Visit: Payer: Self-pay

## 2021-06-29 DIAGNOSIS — E039 Hypothyroidism, unspecified: Secondary | ICD-10-CM | POA: Insufficient documentation

## 2021-06-29 DIAGNOSIS — H10211 Acute toxic conjunctivitis, right eye: Secondary | ICD-10-CM | POA: Diagnosis not present

## 2021-06-29 DIAGNOSIS — I119 Hypertensive heart disease without heart failure: Secondary | ICD-10-CM | POA: Insufficient documentation

## 2021-06-29 DIAGNOSIS — T2661XA Corrosion of cornea and conjunctival sac, right eye, initial encounter: Secondary | ICD-10-CM | POA: Insufficient documentation

## 2021-06-29 DIAGNOSIS — Y9289 Other specified places as the place of occurrence of the external cause: Secondary | ICD-10-CM | POA: Insufficient documentation

## 2021-06-29 DIAGNOSIS — I4891 Unspecified atrial fibrillation: Secondary | ICD-10-CM | POA: Insufficient documentation

## 2021-06-29 DIAGNOSIS — H5711 Ocular pain, right eye: Secondary | ICD-10-CM | POA: Diagnosis not present

## 2021-06-29 DIAGNOSIS — Z951 Presence of aortocoronary bypass graft: Secondary | ICD-10-CM | POA: Insufficient documentation

## 2021-06-29 DIAGNOSIS — T656X1A Toxic effect of paints and dyes, not elsewhere classified, accidental (unintentional), initial encounter: Secondary | ICD-10-CM | POA: Diagnosis not present

## 2021-06-29 DIAGNOSIS — X58XXXA Exposure to other specified factors, initial encounter: Secondary | ICD-10-CM | POA: Insufficient documentation

## 2021-06-29 DIAGNOSIS — E119 Type 2 diabetes mellitus without complications: Secondary | ICD-10-CM | POA: Diagnosis not present

## 2021-06-29 DIAGNOSIS — Z7901 Long term (current) use of anticoagulants: Secondary | ICD-10-CM | POA: Diagnosis not present

## 2021-06-29 DIAGNOSIS — T6594XA Toxic effect of unspecified substance, undetermined, initial encounter: Secondary | ICD-10-CM | POA: Diagnosis not present

## 2021-06-29 DIAGNOSIS — I2510 Atherosclerotic heart disease of native coronary artery without angina pectoris: Secondary | ICD-10-CM | POA: Insufficient documentation

## 2021-06-29 DIAGNOSIS — I10 Essential (primary) hypertension: Secondary | ICD-10-CM | POA: Diagnosis not present

## 2021-06-29 DIAGNOSIS — Z79899 Other long term (current) drug therapy: Secondary | ICD-10-CM | POA: Diagnosis not present

## 2021-06-29 MED ORDER — FLUORESCEIN SODIUM 1 MG OP STRP
1.0000 | ORAL_STRIP | Freq: Once | OPHTHALMIC | Status: AC
  Administered 2021-06-29: 1 via OPHTHALMIC
  Filled 2021-06-29: qty 1

## 2021-06-29 MED ORDER — TETRACAINE HCL 0.5 % OP SOLN
2.0000 [drp] | Freq: Once | OPHTHALMIC | Status: AC
  Administered 2021-06-29: 2 [drp] via OPHTHALMIC
  Filled 2021-06-29: qty 4

## 2021-06-29 NOTE — ED Notes (Signed)
Pt to ED because was painting this morning and some paint splashed in R eye. Eye does not appear red, irritated or swollen. Pt denies pain and blurred vision, stating he "just wants to get it checked". Ambulatory to triage room.

## 2021-06-29 NOTE — ED Triage Notes (Signed)
Pt presents via POV c/o getting paint in right eye. Denies blurred vision. Denies pain.

## 2021-06-29 NOTE — ED Notes (Signed)
Visual acuity screening reveals 20/50 vision R eye, 20/50 vision L eye, and 20/50 vision both eyes. Pt wears glasses and was wearing them for exam.

## 2021-06-29 NOTE — ED Provider Notes (Signed)
[facility] Emergency Department Provider Note  ____________________________________________  Time seen: Approximately 2:39 PM  I have reviewed the triage vital signs and the nursing notes.   HISTORY  Chief Complaint Eye Problem    HPI [name] [name] is a 87 y.o. male who presents the emergency department to evaluate his right eye after dropping some pain into the eye.  Patient was pain contact with some pain when the pain got into his eye.  He flushed it out with water and states that he has no visual changes or eye pain.  He states that while he was painting he was wearing his regular glasses but was not wearing safety glasses and somehow the paint missed his glasses and landed in his eye.  Again no pain or visual changes at this time he wanted to be "checked out."       Past Medical History:  Diagnosis Date   A-fib ([place])    B12 deficiency    Coronary artery disease    HTN (hypertension), benign 04/25/2014   Iron deficiency anemia    Thyroid disease    Type 2 diabetes mellitus with peripheral angiopathy ([place]) 11/10/2018    Patient Active Problem List   Diagnosis Date Noted   Atherosclerosis of native arteries of extremity with intermittent claudication ([place]) 02/18/2021   Cataract cortical, senile 02/11/2021   Cor pulmonale, acute ([place]) 05/02/2020   Type 2 diabetes mellitus with peripheral angiopathy ([place]) 11/10/2018   Medicare annual wellness visit, initial 05/05/2017   Varicose veins of lower extremities with ulcer ([place]) 04/23/2017   Chronic venous insufficiency 04/23/2017   Leg pain 04/23/2017   Lymphedema 04/23/2017   A-fib ([place]) 04/23/2017   Hyperlipidemia 04/23/2017   Acquired hypothyroidism 11/03/2016   B12 deficiency 10/27/2014   CAD (coronary artery disease) 04/25/2014   GERD (gastroesophageal reflux disease) 04/25/2014   HTN (hypertension), benign 04/25/2014   S/P CABG x 3 02/26/1993    Past Surgical History:  Procedure  Laterality Date   [place]      Prior to Admission medications   Medication Sig Start Date End Date Taking? Authorizing Provider  ALPRAZolam Duanne Moron) 0.25 MG tablet  03/31/14   [provider]  clobetasol ointment (TEMOVATE) 6.56 % Apply 1 application topically 2 (two) times daily. Patient not taking: No sig reported 04/21/17   Edrick Kins, DPM  Cyanocobalamin (VITAMIN B-12) 1000 MCG SUBL Take by mouth. 03/23/14   [provider]  doxycycline (VIBRAMYCIN) 100 MG capsule Take 1 capsule (100 mg total) by mouth 2 (two) times daily. Patient not taking: No sig reported 04/13/17   Edrick Kins, DPM  furosemide (LASIX) 20 MG tablet Take 2 tablets by mouth. 07/31/20   [provider]  glycerin adult 2 g suppository Place 1 suppository rectally as needed for constipation. Patient not taking: Reported on 02/11/2021 06/01/18   Merlyn Lot, MD  Iron-Vitamin C (VITRON-C) 65-125 MG TABS Take 1 tablet by mouth once a week. 11/10/18   [provider]  levothyroxine (SYNTHROID, LEVOTHROID) 112 MCG tablet  03/28/14   [provider]  metoprolol succinate (TOPROL-XL) 50 MG 24 hr tablet  03/28/14   [provider]  Multiple Vitamins-Minerals (MULTIVITAMIN WITH MINERALS) tablet Take by mouth. 06/11/16   [provider]  mupirocin ointment (BACTROBAN) 2 % Place 1 application into the nose 2 (two) times daily. Patient not taking: No sig reported 04/07/17   Edrick Kins, DPM  nizatidine (  AXID) 150 MG capsule Take 150 mg by mouth 2 (two) times daily.    [provider]  Omega-3 Fatty Acids (FISH OIL) 1000 MG CAPS Take by mouth daily.    [provider]  omeprazole (PRILOSEC) 20 MG capsule Take by mouth. 01/06/17 07/05/17  [provider]  ondansetron (ZOFRAN ODT) 4 MG disintegrating tablet Take 1 tablet (4 mg total) by mouth every 8 (eight) hours as needed for nausea or vomiting. Patient not taking: Reported  on 02/11/2021 02/05/19   Paulette Blanch, MD  polyethylene glycol [facility] / Floria Raveling) packet Take 17 g by mouth daily. Mix one tablespoon with 8oz of your favorite juice or water every day until you are having soft formed stools. Then start taking once daily if you didn't have a stool the day before. 06/01/18   Merlyn Lot, MD  potassium chloride (K-DUR,KLOR-CON) 10 MEQ tablet Take by mouth. 09/25/14   [provider]  simvastatin (ZOCOR) 40 MG tablet  03/28/14   [provider]  triamcinolone cream (KENALOG) 0.5 % Apply 1 application topically 3 (three) times daily. Patient not taking: Reported on 02/11/2021 04/21/17   Edrick Kins, DPM  warfarin (COUMADIN) 5 MG tablet 5 mg at bedtime. 03/28/14   [provider]    Allergies Cephalexin, Spironolactone, Codeine, and Doxycycline  Family History  Problem Relation Age of Onset   Hypertension Mother    Diabetes Brother    Stroke Brother    Heart attack Brother     Social History Social History   Tobacco Use   Smoking status: Never   Smokeless tobacco: Never  Substance Use Topics   Alcohol use: No   Drug use: No     Review of Systems  Constitutional: No fever/chills Eyes: No visual changes. No discharge.  Patient dripped paint into the right eye ENT: No upper respiratory complaints. Cardiovascular: no chest pain. Respiratory: no cough. No SOB. Gastrointestinal: No abdominal pain.  No nausea, no vomiting.  No diarrhea.  No constipation. Musculoskeletal: Negative for musculoskeletal pain. Skin: Negative for rash, abrasions, lacerations, ecchymosis. Neurological: Negative for headaches, focal weakness or numbness.  10 System ROS otherwise negative.  ____________________________________________   PHYSICAL EXAM:  VITAL SIGNS: ED Triage Vitals  Enc Vitals Group     BP 06/29/21 1357 138/73     Pulse Rate 06/29/21 1357 64     Resp 06/29/21 1357 18     Temp 06/29/21 1357 98.3 F (36.8 C)     Temp  Source 06/29/21 1357 Oral     SpO2 06/29/21 1357 97 %     Weight 06/29/21 1357 158 lb (71.7 kg)     Height 06/29/21 1357 5\' 8"  (1.727 m)     Head Circumference --      Peak Flow --      Pain Score 06/29/21 1402 0     Pain Loc --      Pain Edu? --      Excl. in [place]? --      Constitutional: Alert and oriented. Well appearing and in no acute distress. Eyes: Conjunctivae are normal. PERRL. EOMI. external exam reveals no visual pain on eyelids or eyelashes.  No external findings to include: Conjunctival erythema or subconjunctival hemorrhage.  Funduscopic exam is reassuring with red reflex and vasculature optic disc being unremarkable.  pH is tested measuring between 6 and 7 based off of color changes.  Patient with no evidence of chemical burn/ Head: Atraumatic. ENT:  Ears:       Nose: No congestion/rhinnorhea.      Mouth/Throat: Mucous membranes are moist.  Neck: No stridor.    Cardiovascular: Normal rate, regular rhythm. Normal S1 and S2.  Good peripheral circulation. Respiratory: Normal respiratory effort without tachypnea or retractions. Lungs CTAB. Good air entry to the bases with no decreased or absent breath sounds. Musculoskeletal: Full range of motion to all extremities. No gross deformities appreciated. Neurologic:  Normal speech and language. No gross focal neurologic deficits are appreciated.  Skin:  Skin is warm, dry and intact. No rash noted. Psychiatric: Mood and affect are normal. Speech and behavior are normal. Patient exhibits appropriate insight and judgement.   ____________________________________________   LABS (all labs ordered are listed, but only abnormal results are displayed)  Labs Reviewed - No data to display ____________________________________________  EKG   ____________________________________________  RADIOLOGY   No results found.  ____________________________________________    PROCEDURES  Procedure(s) performed:     Procedures    Medications  fluorescein ophthalmic strip 1 strip (has no administration in time range)  tetracaine (PONTOCAINE) 0.5 % ophthalmic solution 2 drop (has no administration in time range)     ____________________________________________   INITIAL IMPRESSION / ASSESSMENT AND PLAN / ED COURSE  Pertinent labs & imaging results that were available during my care of the patient were reviewed by me and considered in my medical decision making (see chart for details).  Review of the [place] CSRS was performed in accordance of the [place] prior to dispensing any controlled drugs.           Patient's diagnosis is consistent with chemical conjunctivitis.  Patient presented to the ED after some paint dripped into his eye.  Exam is reassuring at this time with no evidence of chemical burn.  pH is stable.  Patient has no vision changes and no pain.  No medications at this time.  Patient is stable for discharge.  Return precautions discussed with the patient..  Otherwise patient can follow-up with primary care or ophthalmology if needed.  Patient is given ED precautions to return to the ED for any worsening or new symptoms.     ____________________________________________  FINAL CLINICAL IMPRESSION(S) / ED DIAGNOSES  Final diagnoses:  Chemical conjunctivitis of right eye      NEW MEDICATIONS STARTED DURING THIS VISIT:  ED Discharge Orders     None           This chart was dictated using voice recognition software/Dragon. Despite best efforts to proofread, errors can occur which can change the meaning. Any change was purely unintentional.    Darletta Moll, PA-C 06/29/21 1615    Blake Divine, MD 07/03/21 [phone]

## 2021-06-29 NOTE — ED Notes (Signed)
Pt states he drives himself, pt states he feels comfortable driving. Gait steady.

## 2021-07-05 DIAGNOSIS — I482 Chronic atrial fibrillation, unspecified: Secondary | ICD-10-CM | POA: Diagnosis not present

## 2021-07-11 DIAGNOSIS — L03116 Cellulitis of left lower limb: Secondary | ICD-10-CM | POA: Diagnosis not present

## 2021-07-11 DIAGNOSIS — Z9181 History of falling: Secondary | ICD-10-CM | POA: Diagnosis not present

## 2021-07-11 DIAGNOSIS — A419 Sepsis, unspecified organism: Secondary | ICD-10-CM | POA: Diagnosis not present

## 2021-07-11 DIAGNOSIS — I959 Hypotension, unspecified: Secondary | ICD-10-CM | POA: Diagnosis not present

## 2021-07-22 DIAGNOSIS — E119 Type 2 diabetes mellitus without complications: Secondary | ICD-10-CM | POA: Diagnosis not present

## 2021-07-22 DIAGNOSIS — E1151 Type 2 diabetes mellitus with diabetic peripheral angiopathy without gangrene: Secondary | ICD-10-CM | POA: Diagnosis not present

## 2021-07-22 DIAGNOSIS — L03116 Cellulitis of left lower limb: Secondary | ICD-10-CM | POA: Diagnosis not present

## 2021-07-26 DIAGNOSIS — I872 Venous insufficiency (chronic) (peripheral): Secondary | ICD-10-CM | POA: Diagnosis not present

## 2021-07-26 DIAGNOSIS — L0390 Cellulitis, unspecified: Secondary | ICD-10-CM | POA: Diagnosis not present

## 2021-08-01 DIAGNOSIS — I5023 Acute on chronic systolic (congestive) heart failure: Secondary | ICD-10-CM | POA: Diagnosis not present

## 2021-08-01 DIAGNOSIS — L0390 Cellulitis, unspecified: Secondary | ICD-10-CM | POA: Diagnosis not present

## 2021-08-01 DIAGNOSIS — R600 Localized edema: Secondary | ICD-10-CM | POA: Diagnosis not present

## 2021-08-05 DIAGNOSIS — I480 Paroxysmal atrial fibrillation: Secondary | ICD-10-CM | POA: Diagnosis not present

## 2021-08-12 DIAGNOSIS — I872 Venous insufficiency (chronic) (peripheral): Secondary | ICD-10-CM | POA: Diagnosis not present

## 2021-08-19 ENCOUNTER — Telehealth (INDEPENDENT_AMBULATORY_CARE_PROVIDER_SITE_OTHER): Payer: Self-pay | Admitting: Vascular Surgery

## 2021-08-19 NOTE — Telephone Encounter (Signed)
He can come in with ABIs to see GS or myself (per pt preference).  If he is having swelling he should be utilizing medical grade compression socks daily and elevating his lower extremities.

## 2021-08-19 NOTE — Telephone Encounter (Signed)
Patient is having a flare up of his left leg with pain.  Patient states it's not all the time but bothers him when he tries. Patient states that it's swelling.  PCP help clear up infection in leg, and swelling has went away.  Swelling came back and his leg was wrap.  Patient states both legs became swollen.  Patient want to be seen to possibly check the flow.  Please advise.

## 2021-08-21 ENCOUNTER — Other Ambulatory Visit: Payer: Self-pay

## 2021-08-21 ENCOUNTER — Ambulatory Visit (INDEPENDENT_AMBULATORY_CARE_PROVIDER_SITE_OTHER): Payer: Medicare HMO

## 2021-08-21 ENCOUNTER — Encounter (INDEPENDENT_AMBULATORY_CARE_PROVIDER_SITE_OTHER): Payer: Self-pay | Admitting: Nurse Practitioner

## 2021-08-21 ENCOUNTER — Ambulatory Visit (INDEPENDENT_AMBULATORY_CARE_PROVIDER_SITE_OTHER): Payer: Medicare HMO | Admitting: Nurse Practitioner

## 2021-08-21 ENCOUNTER — Other Ambulatory Visit (INDEPENDENT_AMBULATORY_CARE_PROVIDER_SITE_OTHER): Payer: Self-pay | Admitting: Nurse Practitioner

## 2021-08-21 VITALS — BP 128/57 | HR 73 | Resp 16 | Wt 169.0 lb

## 2021-08-21 DIAGNOSIS — L03116 Cellulitis of left lower limb: Secondary | ICD-10-CM

## 2021-08-21 DIAGNOSIS — I7025 Atherosclerosis of native arteries of other extremities with ulceration: Secondary | ICD-10-CM | POA: Diagnosis not present

## 2021-08-21 DIAGNOSIS — E782 Mixed hyperlipidemia: Secondary | ICD-10-CM | POA: Diagnosis not present

## 2021-08-21 DIAGNOSIS — I890 Lymphedema, not elsewhere classified: Secondary | ICD-10-CM

## 2021-08-21 DIAGNOSIS — M79669 Pain in unspecified lower leg: Secondary | ICD-10-CM | POA: Diagnosis not present

## 2021-08-21 DIAGNOSIS — M7989 Other specified soft tissue disorders: Secondary | ICD-10-CM

## 2021-08-21 DIAGNOSIS — I4892 Unspecified atrial flutter: Secondary | ICD-10-CM | POA: Diagnosis not present

## 2021-08-21 DIAGNOSIS — R600 Localized edema: Secondary | ICD-10-CM

## 2021-08-21 DIAGNOSIS — I5023 Acute on chronic systolic (congestive) heart failure: Secondary | ICD-10-CM | POA: Diagnosis not present

## 2021-08-21 DIAGNOSIS — I10 Essential (primary) hypertension: Secondary | ICD-10-CM

## 2021-08-21 DIAGNOSIS — I4891 Unspecified atrial fibrillation: Secondary | ICD-10-CM | POA: Diagnosis not present

## 2021-08-21 MED ORDER — SULFAMETHOXAZOLE-TRIMETHOPRIM 400-80 MG PO TABS: 1.0000 | ORAL_TABLET | Freq: Two times a day (BID) | ORAL | 0 refills | Status: DC

## 2021-08-21 NOTE — Progress Notes (Signed)
Subjective:    Patient ID: [name] [name], male    DOB: Jun 15, 1929, 87 y.o.   MRN: [id number] Chief Complaint  Patient presents with  . Follow-up    Add on per phone note    [name] [name] is an 87 year old male that presents today for evaluation due to swelling of his left lower extremity with recurrent ulcerations.  The patient has had multiple bouts of cellulitis that he had to be treated with antibiotics.  The patient has also underwent Unna wraps that were also helpful for controlling the swelling as well.  The patient notes that he is having pain in his leg and the pain is worse at night when his leg is elevated.  In fact his pain is worse with elevation at any time.  He does have a known history of peripheral arterial disease.  The right lower extremity is noncompressible with a TBI of 1.26.  The left has an ABI 0.55 with a TBI of 0.12.  The patient has monophasic tibial artery waveforms bilaterally with dampened toe waveforms on the left.  Review of Systems  Cardiovascular:  Positive for leg swelling.  Skin:  Positive for wound.  All other systems reviewed and are negative.     Objective:   Physical Exam Vitals reviewed.  HENT:     Head: Normocephalic.  Cardiovascular:     Rate and Rhythm: Normal rate.  Pulmonary:     Effort: Pulmonary effort is normal.  Musculoskeletal:     Left lower leg: 3+ Edema present.  Skin:    General: Skin is dry.     Findings: Erythema and wound present.  Neurological:     Mental Status: He is alert and oriented to person, place, and time.  Psychiatric:        Mood and Affect: Mood normal.        Behavior: Behavior normal.        Thought Content: Thought content normal.        Judgment: Judgment normal.    BP (!) 128/57 (BP Location: Left Arm)   Pulse 73   Resp 16   Wt 169 lb (76.7 kg)   BMI 25.70 kg/m   Past Medical History:  Diagnosis Date  . A-fib ([place])   . B12 deficiency   . Coronary artery disease   . HTN  (hypertension), benign 04/25/2014  . Iron deficiency anemia   . Thyroid disease   . Type 2 diabetes mellitus with peripheral angiopathy ([place]) 11/10/2018    Social History   Socioeconomic History  . Marital status: Widowed    Spouse name: Not on file  . Number of children: Not on file  . Years of education: Not on file  . Highest education level: Not on file  Occupational History  . Not on file  Tobacco Use  . Smoking status: Never  . Smokeless tobacco: Never  Substance and Sexual Activity  . Alcohol use: No  . Drug use: No  . Sexual activity: Not on file  Other Topics Concern  . Not on file  Social History Narrative  . Not on file   Social Determinants of Health   Financial Resource Strain: Not on file  Food Insecurity: Not on file  Transportation Needs: Not on file  Physical Activity: Not on file  Stress: Not on file  Social Connections: Not on file  Intimate Partner Violence: Not on file    Past Surgical History:  Procedure Laterality Date  .  CARDIAC SURGERY    . HERNIA REPAIR      Family History  Problem Relation Age of Onset  . Hypertension Mother   . Diabetes Brother   . Stroke Brother   . Heart attack Brother     Allergies  Allergen Reactions  . Cephalexin Rash  . Spironolactone Nausea Only  . Codeine Other (See Comments) and Rash    Can not sleep Other reaction(s): Other (See Comments) Can't sleep Other reaction(s): Other (See Comments) Can't sleep Can not sleep Other reaction(s): Other (See Comments) Can't sleep  . Doxycycline Rash    CBC Latest Ref Rng & Units 02/05/2019 02/04/2015 11/06/2013  WBC 4.0 - 10.5 K/uL 10.5 9.7 9.3  Hemoglobin 13.0 - 17.0 g/dL 11.6(L) 11.8(L) 13.1  Hematocrit 39.0 - 52.0 % 33.8(L) 35.7(L) 37.1(L)  Platelets 150 - 400 K/uL 152 167 150      CMP     Component Value Date/Time   NA 138 02/05/2019 0121   NA 141 02/04/2015 1001   K 4.3 02/05/2019 0121   K 4.2 02/04/2015 1001   CL 105 02/05/2019 0121   CL 106  02/04/2015 1001   CO2 26 02/05/2019 0121   CO2 30 02/04/2015 1001   GLUCOSE 139 (H) 02/05/2019 0121   GLUCOSE 122 (H) 02/04/2015 1001   BUN 28 (H) 02/05/2019 0121   BUN 19 (H) 02/04/2015 1001   CREATININE 1.06 02/05/2019 0121   CREATININE 1.26 02/04/2015 1001   CALCIUM 8.9 02/05/2019 0121   CALCIUM 8.5 02/04/2015 1001   PROT 7.5 02/05/2019 0121   PROT 7.2 02/04/2015 1001   ALBUMIN 4.4 02/05/2019 0121   ALBUMIN 3.5 02/04/2015 1001   AST 27 02/05/2019 0121   AST 28 02/04/2015 1001   ALT 21 02/05/2019 0121   ALT 20 02/04/2015 1001   ALKPHOS 51 02/05/2019 0121   ALKPHOS 79 02/04/2015 1001   BILITOT 1.1 02/05/2019 0121   BILITOT 0.6 02/04/2015 1001   GFRNONAA >60 02/05/2019 0121   GFRNONAA 58 (L) 02/04/2015 1001   GFRNONAA >60 11/06/2013 1304   GFRAA >60 02/05/2019 0121   GFRAA >60 02/04/2015 1001   GFRAA >60 11/06/2013 1304     No results found.     Assessment & Plan:   1. Atherosclerosis of native arteries of the extremities with ulceration ([place])  Recommend:  The patient has evidence of severe atherosclerotic changes of both lower extremities associated with ulceration and tissue loss of the foot.  This represents a limb threatening ischemia and places the patient at the risk for limb loss.  Patient should undergo angiography of the lower extremities with the hope for intervention for limb salvage.  The risks and benefits as well as the alternative therapies was discussed in detail with the patient.  All questions were answered.  Patient agrees to proceed with angiography.  The patient will follow up with me in the office after the procedure.    2. Mixed hyperlipidemia Continue statin as ordered and reviewed, no changes at this time   3. HTN (hypertension), benign Continue antihypertensive medications as already ordered, these medications have been reviewed and there are no changes at this time.   4. Lymphedema The patient does have severe swelling which is also  causing delayed healing of his wounds.  We will try to have the patient placed in Unna wraps following his angiogram.  This is due to the fact that his TBI's are so we did not want to compromise his circulation any further.  5. Cellulitis of left lower extremity The patient has had previous good results with treating his cellulitis with Bactrim.  We will prescribe that today as well.  Given that the patient is on Coumadin we will also have him have his dose of Coumadin as he has been advised to do previously.   Current Outpatient Medications on File Prior to Visit  Medication Sig Dispense Refill  . Cyanocobalamin (VITAMIN B-12) 1000 MCG SUBL Take by mouth.    . ferrous sulfate 325 (65 FE) MG EC tablet Take 325 mg by mouth daily with breakfast.    . levothyroxine (SYNTHROID, LEVOTHROID) 112 MCG tablet     . metoprolol succinate (TOPROL-XL) 50 MG 24 hr tablet     . Multiple Vitamins-Minerals (MULTIVITAMIN WITH MINERALS) tablet Take by mouth.    . nizatidine (AXID) 150 MG capsule Take 150 mg by mouth 2 (two) times daily.    . Omega-3 Fatty Acids (FISH OIL) 1000 MG CAPS Take by mouth in the morning and at bedtime.    Marland Kitchen omeprazole (PRILOSEC) 20 MG capsule Take by mouth.    . polyethylene glycol (MIRALAX / GLYCOLAX) packet Take 17 g by mouth daily. Mix one tablespoon with 8oz of your favorite juice or water every day until you are having soft formed stools. Then start taking once daily if you didn't have a stool the day before. 30 each 0  . simvastatin (ZOCOR) 40 MG tablet     . torsemide (DEMADEX) 20 MG tablet Take 40 mg by mouth daily.    Marland Kitchen warfarin (COUMADIN) 5 MG tablet 5 mg at bedtime.    . ALPRAZolam (XANAX) 0.25 MG tablet  (Patient not taking: Reported on 08/21/2021)    . clobetasol ointment (TEMOVATE) AB-123456789 % Apply 1 application topically 2 (two) times daily. (Patient not taking: No sig reported) 30 g 0  . doxycycline (VIBRAMYCIN) 100 MG capsule Take 1 capsule (100 mg total) by mouth 2 (two)  times daily. (Patient not taking: No sig reported) 14 capsule 0  . furosemide (LASIX) 20 MG tablet Take 2 tablets by mouth. (Patient not taking: Reported on 08/21/2021)    . glycerin adult 2 g suppository Place 1 suppository rectally as needed for constipation. (Patient not taking: No sig reported) 12 suppository 0  . Iron-Vitamin C (VITRON-C) 65-125 MG TABS Take 1 tablet by mouth once a week. (Patient not taking: Reported on 08/21/2021)    . mupirocin ointment (BACTROBAN) 2 % Place 1 application into the nose 2 (two) times daily. (Patient not taking: No sig reported) 22 g 0  . ondansetron (ZOFRAN ODT) 4 MG disintegrating tablet Take 1 tablet (4 mg total) by mouth every 8 (eight) hours as needed for nausea or vomiting. (Patient not taking: No sig reported) 20 tablet 0  . potassium chloride (K-DUR,KLOR-CON) 10 MEQ tablet Take by mouth. (Patient not taking: Reported on 08/21/2021)    . triamcinolone cream (KENALOG) 0.5 % Apply 1 application topically 3 (three) times daily. (Patient not taking: No sig reported) 30 g 1   No current facility-administered medications on file prior to visit.    There are no Patient Instructions on file for this visit. No follow-ups on file.   Kris Hartmann, NP

## 2021-08-21 NOTE — H&P (View-Only) (Signed)
Subjective:    Patient ID: [name] [name], male    DOB: [birth year], 87 y.o.   MRN: [id number] Chief Complaint  Patient presents with  . Follow-up    Add on per phone note    [name] [name] is an 87 year old male that presents today for evaluation due to swelling of his left lower extremity with recurrent ulcerations.  The patient has had multiple bouts of cellulitis that he had to be treated with antibiotics.  The patient has also underwent Unna wraps that were also helpful for controlling the swelling as well.  The patient notes that he is having pain in his leg and the pain is worse at night when his leg is elevated.  In fact his pain is worse with elevation at any time.  He does have a known history of peripheral arterial disease.  The right lower extremity is noncompressible with a TBI of 1.26.  The left has an ABI 0.55 with a TBI of 0.12.  The patient has monophasic tibial artery waveforms bilaterally with dampened toe waveforms on the left.  Review of Systems  Cardiovascular:  Positive for leg swelling.  Skin:  Positive for wound.  All other systems reviewed and are negative.     Objective:   Physical Exam Vitals reviewed.  HENT:     Head: Normocephalic.  Cardiovascular:     Rate and Rhythm: Normal rate.  Pulmonary:     Effort: Pulmonary effort is normal.  Musculoskeletal:     Left lower leg: 3+ Edema present.  Skin:    General: Skin is dry.     Findings: Erythema and wound present.  Neurological:     Mental Status: He is alert and oriented to person, place, and time.  Psychiatric:        Mood and Affect: Mood normal.        Behavior: Behavior normal.        Thought Content: Thought content normal.        Judgment: Judgment normal.    BP (!) 128/57 (BP Location: Left Arm)   Pulse 73   Resp 16   Wt 169 lb (76.7 kg)   BMI 25.70 kg/m   Past Medical History:  Diagnosis Date  . A-fib ([place])   . B12 deficiency   . Coronary artery disease   . HTN  (hypertension), benign 04/25/2014  . Iron deficiency anemia   . Thyroid disease   . Type 2 diabetes mellitus with peripheral angiopathy ([place]) 11/10/2018    Social History   Socioeconomic History  . Marital status: Widowed    Spouse name: Not on file  . Number of children: Not on file  . Years of education: Not on file  . Highest education level: Not on file  Occupational History  . Not on file  Tobacco Use  . Smoking status: Never  . Smokeless tobacco: Never  Substance and Sexual Activity  . Alcohol use: No  . Drug use: No  . Sexual activity: Not on file  Other Topics Concern  . Not on file  Social History Narrative  . Not on file   Social Determinants of Health   Financial Resource Strain: Not on file  Food Insecurity: Not on file  Transportation Needs: Not on file  Physical Activity: Not on file  Stress: Not on file  Social Connections: Not on file  Intimate Partner Violence: Not on file    Past Surgical History:  Procedure Laterality Date  .  CARDIAC SURGERY    . HERNIA REPAIR      Family History  Problem Relation Age of Onset  . Hypertension Mother   . Diabetes Brother   . Stroke Brother   . Heart attack Brother     Allergies  Allergen Reactions  . Cephalexin Rash  . Spironolactone Nausea Only  . Codeine Other (See Comments) and Rash    Can not sleep Other reaction(s): Other (See Comments) Can't sleep Other reaction(s): Other (See Comments) Can't sleep Can not sleep Other reaction(s): Other (See Comments) Can't sleep  . Doxycycline Rash    CBC Latest Ref Rng & Units 02/05/2019 02/04/2015 11/06/2013  WBC 4.0 - 10.5 K/uL 10.5 9.7 9.3  Hemoglobin 13.0 - 17.0 g/dL 11.6(L) 11.8(L) 13.1  Hematocrit 39.0 - 52.0 % 33.8(L) 35.7(L) 37.1(L)  Platelets 150 - 400 K/uL 152 167 150      CMP     Component Value Date/Time   NA 138 02/05/2019 0121   NA 141 02/04/2015 1001   K 4.3 02/05/2019 0121   K 4.2 02/04/2015 1001   CL 105 02/05/2019 0121   CL 106  02/04/2015 1001   CO2 26 02/05/2019 0121   CO2 30 02/04/2015 1001   GLUCOSE 139 (H) 02/05/2019 0121   GLUCOSE 122 (H) 02/04/2015 1001   BUN 28 (H) 02/05/2019 0121   BUN 19 (H) 02/04/2015 1001   CREATININE 1.06 02/05/2019 0121   CREATININE 1.26 02/04/2015 1001   CALCIUM 8.9 02/05/2019 0121   CALCIUM 8.5 02/04/2015 1001   PROT 7.5 02/05/2019 0121   PROT 7.2 02/04/2015 1001   ALBUMIN 4.4 02/05/2019 0121   ALBUMIN 3.5 02/04/2015 1001   AST 27 02/05/2019 0121   AST 28 02/04/2015 1001   ALT 21 02/05/2019 0121   ALT 20 02/04/2015 1001   ALKPHOS 51 02/05/2019 0121   ALKPHOS 79 02/04/2015 1001   BILITOT 1.1 02/05/2019 0121   BILITOT 0.6 02/04/2015 1001   GFRNONAA >60 02/05/2019 0121   GFRNONAA 58 (L) 02/04/2015 1001   GFRNONAA >60 11/06/2013 1304   GFRAA >60 02/05/2019 0121   GFRAA >60 02/04/2015 1001   GFRAA >60 11/06/2013 1304     No results found.     Assessment & Plan:   1. Atherosclerosis of native arteries of the extremities with ulceration ([place])  Recommend:  The patient has evidence of severe atherosclerotic changes of both lower extremities associated with ulceration and tissue loss of the foot.  This represents a limb threatening ischemia and places the patient at the risk for limb loss.  Patient should undergo angiography of the lower extremities with the hope for intervention for limb salvage.  The risks and benefits as well as the alternative therapies was discussed in detail with the patient.  All questions were answered.  Patient agrees to proceed with angiography.  The patient will follow up with me in the office after the procedure.    2. Mixed hyperlipidemia Continue statin as ordered and reviewed, no changes at this time   3. HTN (hypertension), benign Continue antihypertensive medications as already ordered, these medications have been reviewed and there are no changes at this time.   4. Lymphedema The patient does have severe swelling which is also  causing delayed healing of his wounds.  We will try to have the patient placed in Unna wraps following his angiogram.  This is due to the fact that his TBI's are so we did not want to compromise his circulation any further.  5. Cellulitis of left lower extremity The patient has had previous good results with treating his cellulitis with Bactrim.  We will prescribe that today as well.  Given that the patient is on Coumadin we will also have him have his dose of Coumadin as he has been advised to do previously.   Current Outpatient Medications on File Prior to Visit  Medication Sig Dispense Refill  . Cyanocobalamin (VITAMIN B-12) 1000 MCG SUBL Take by mouth.    . ferrous sulfate 325 (65 FE) MG EC tablet Take 325 mg by mouth daily with breakfast.    . levothyroxine (SYNTHROID, LEVOTHROID) 112 MCG tablet     . metoprolol succinate (TOPROL-XL) 50 MG 24 hr tablet     . Multiple Vitamins-Minerals (MULTIVITAMIN WITH MINERALS) tablet Take by mouth.    . nizatidine (AXID) 150 MG capsule Take 150 mg by mouth 2 (two) times daily.    . Omega-3 Fatty Acids (FISH OIL) 1000 MG CAPS Take by mouth in the morning and at bedtime.    Marland Kitchen omeprazole (PRILOSEC) 20 MG capsule Take by mouth.    . polyethylene glycol (MIRALAX / GLYCOLAX) packet Take 17 g by mouth daily. Mix one tablespoon with 8oz of your favorite juice or water every day until you are having soft formed stools. Then start taking once daily if you didn't have a stool the day before. 30 each 0  . simvastatin (ZOCOR) 40 MG tablet     . torsemide (DEMADEX) 20 MG tablet Take 40 mg by mouth daily.    Marland Kitchen warfarin (COUMADIN) 5 MG tablet 5 mg at bedtime.    . ALPRAZolam (XANAX) 0.25 MG tablet  (Patient not taking: Reported on 08/21/2021)    . clobetasol ointment (TEMOVATE) AB-123456789 % Apply 1 application topically 2 (two) times daily. (Patient not taking: No sig reported) 30 g 0  . doxycycline (VIBRAMYCIN) 100 MG capsule Take 1 capsule (100 mg total) by mouth 2 (two)  times daily. (Patient not taking: No sig reported) 14 capsule 0  . furosemide (LASIX) 20 MG tablet Take 2 tablets by mouth. (Patient not taking: Reported on 08/21/2021)    . glycerin adult 2 g suppository Place 1 suppository rectally as needed for constipation. (Patient not taking: No sig reported) 12 suppository 0  . Iron-Vitamin C (VITRON-C) 65-125 MG TABS Take 1 tablet by mouth once a week. (Patient not taking: Reported on 08/21/2021)    . mupirocin ointment (BACTROBAN) 2 % Place 1 application into the nose 2 (two) times daily. (Patient not taking: No sig reported) 22 g 0  . ondansetron (ZOFRAN ODT) 4 MG disintegrating tablet Take 1 tablet (4 mg total) by mouth every 8 (eight) hours as needed for nausea or vomiting. (Patient not taking: No sig reported) 20 tablet 0  . potassium chloride (K-DUR,KLOR-CON) 10 MEQ tablet Take by mouth. (Patient not taking: Reported on 08/21/2021)    . triamcinolone cream (KENALOG) 0.5 % Apply 1 application topically 3 (three) times daily. (Patient not taking: No sig reported) 30 g 1   No current facility-administered medications on file prior to visit.    There are no Patient Instructions on file for this visit. No follow-ups on file.   Kris Hartmann, NP

## 2021-08-21 NOTE — H&P (View-Only) (Signed)
Subjective:    Patient ID: [name] [name], male    DOB: [birth year], 87 y.o.   MRN: [id number] Chief Complaint  Patient presents with  . Follow-up    Add on per phone note    [name] [name] is an 87 year old male that presents today for evaluation due to swelling of his left lower extremity with recurrent ulcerations.  The patient has had multiple bouts of cellulitis that he had to be treated with antibiotics.  The patient has also underwent Unna wraps that were also helpful for controlling the swelling as well.  The patient notes that he is having pain in his leg and the pain is worse at night when his leg is elevated.  In fact his pain is worse with elevation at any time.  He does have a known history of peripheral arterial disease.  The right lower extremity is noncompressible with a TBI of 1.26.  The left has an ABI 0.55 with a TBI of 0.12.  The patient has monophasic tibial artery waveforms bilaterally with dampened toe waveforms on the left.  Review of Systems  Cardiovascular:  Positive for leg swelling.  Skin:  Positive for wound.  All other systems reviewed and are negative.     Objective:   Physical Exam Vitals reviewed.  HENT:     Head: Normocephalic.  Cardiovascular:     Rate and Rhythm: Normal rate.  Pulmonary:     Effort: Pulmonary effort is normal.  Musculoskeletal:     Left lower leg: 3+ Edema present.  Skin:    General: Skin is dry.     Findings: Erythema and wound present.  Neurological:     Mental Status: He is alert and oriented to person, place, and time.  Psychiatric:        Mood and Affect: Mood normal.        Behavior: Behavior normal.        Thought Content: Thought content normal.        Judgment: Judgment normal.    BP (!) 128/57 (BP Location: Left Arm)   Pulse 73   Resp 16   Wt 169 lb (76.7 kg)   BMI 25.70 kg/m   Past Medical History:  Diagnosis Date  . A-fib ([place])   . B12 deficiency   . Coronary artery disease   . HTN  (hypertension), benign 04/25/2014  . Iron deficiency anemia   . Thyroid disease   . Type 2 diabetes mellitus with peripheral angiopathy ([place]) 11/10/2018    Social History   Socioeconomic History  . Marital status: Widowed    Spouse name: Not on file  . Number of children: Not on file  . Years of education: Not on file  . Highest education level: Not on file  Occupational History  . Not on file  Tobacco Use  . Smoking status: Never  . Smokeless tobacco: Never  Substance and Sexual Activity  . Alcohol use: No  . Drug use: No  . Sexual activity: Not on file  Other Topics Concern  . Not on file  Social History Narrative  . Not on file   Social Determinants of Health   Financial Resource Strain: Not on file  Food Insecurity: Not on file  Transportation Needs: Not on file  Physical Activity: Not on file  Stress: Not on file  Social Connections: Not on file  Intimate Partner Violence: Not on file    Past Surgical History:  Procedure Laterality Date  .  CARDIAC SURGERY    . HERNIA REPAIR      Family History  Problem Relation Age of Onset  . Hypertension Mother   . Diabetes Brother   . Stroke Brother   . Heart attack Brother     Allergies  Allergen Reactions  . Cephalexin Rash  . Spironolactone Nausea Only  . Codeine Other (See Comments) and Rash    Can not sleep Other reaction(s): Other (See Comments) Can't sleep Other reaction(s): Other (See Comments) Can't sleep Can not sleep Other reaction(s): Other (See Comments) Can't sleep  . Doxycycline Rash    CBC Latest Ref Rng & Units 02/05/2019 02/04/2015 11/06/2013  WBC 4.0 - 10.5 K/uL 10.5 9.7 9.3  Hemoglobin 13.0 - 17.0 g/dL 11.6(L) 11.8(L) 13.1  Hematocrit 39.0 - 52.0 % 33.8(L) 35.7(L) 37.1(L)  Platelets 150 - 400 K/uL 152 167 150      CMP     Component Value Date/Time   NA 138 02/05/2019 0121   NA 141 02/04/2015 1001   K 4.3 02/05/2019 0121   K 4.2 02/04/2015 1001   CL 105 02/05/2019 0121   CL 106  02/04/2015 1001   CO2 26 02/05/2019 0121   CO2 30 02/04/2015 1001   GLUCOSE 139 (H) 02/05/2019 0121   GLUCOSE 122 (H) 02/04/2015 1001   BUN 28 (H) 02/05/2019 0121   BUN 19 (H) 02/04/2015 1001   CREATININE 1.06 02/05/2019 0121   CREATININE 1.26 02/04/2015 1001   CALCIUM 8.9 02/05/2019 0121   CALCIUM 8.5 02/04/2015 1001   PROT 7.5 02/05/2019 0121   PROT 7.2 02/04/2015 1001   ALBUMIN 4.4 02/05/2019 0121   ALBUMIN 3.5 02/04/2015 1001   AST 27 02/05/2019 0121   AST 28 02/04/2015 1001   ALT 21 02/05/2019 0121   ALT 20 02/04/2015 1001   ALKPHOS 51 02/05/2019 0121   ALKPHOS 79 02/04/2015 1001   BILITOT 1.1 02/05/2019 0121   BILITOT 0.6 02/04/2015 1001   GFRNONAA >60 02/05/2019 0121   GFRNONAA 58 (L) 02/04/2015 1001   GFRNONAA >60 11/06/2013 1304   GFRAA >60 02/05/2019 0121   GFRAA >60 02/04/2015 1001   GFRAA >60 11/06/2013 1304     No results found.     Assessment & Plan:   1. Atherosclerosis of native arteries of the extremities with ulceration ([place])  Recommend:  The patient has evidence of severe atherosclerotic changes of both lower extremities associated with ulceration and tissue loss of the foot.  This represents a limb threatening ischemia and places the patient at the risk for limb loss.  Patient should undergo angiography of the lower extremities with the hope for intervention for limb salvage.  The risks and benefits as well as the alternative therapies was discussed in detail with the patient.  All questions were answered.  Patient agrees to proceed with angiography.  The patient will follow up with me in the office after the procedure.    2. Mixed hyperlipidemia Continue statin as ordered and reviewed, no changes at this time   3. HTN (hypertension), benign Continue antihypertensive medications as already ordered, these medications have been reviewed and there are no changes at this time.   4. Lymphedema The patient does have severe swelling which is also  causing delayed healing of his wounds.  We will try to have the patient placed in Unna wraps following his angiogram.  This is due to the fact that his TBI's are so we did not want to compromise his circulation any further.  5. Cellulitis of left lower extremity The patient has had previous good results with treating his cellulitis with Bactrim.  We will prescribe that today as well.  Given that the patient is on Coumadin we will also have him have his dose of Coumadin as he has been advised to do previously.   Current Outpatient Medications on File Prior to Visit  Medication Sig Dispense Refill  . Cyanocobalamin (VITAMIN B-12) 1000 MCG SUBL Take by mouth.    . ferrous sulfate 325 (65 FE) MG EC tablet Take 325 mg by mouth daily with breakfast.    . levothyroxine (SYNTHROID, LEVOTHROID) 112 MCG tablet     . metoprolol succinate (TOPROL-XL) 50 MG 24 hr tablet     . Multiple Vitamins-Minerals (MULTIVITAMIN WITH MINERALS) tablet Take by mouth.    . nizatidine (AXID) 150 MG capsule Take 150 mg by mouth 2 (two) times daily.    . Omega-3 Fatty Acids (FISH OIL) 1000 MG CAPS Take by mouth in the morning and at bedtime.    Marland Kitchen omeprazole (PRILOSEC) 20 MG capsule Take by mouth.    . polyethylene glycol (MIRALAX / GLYCOLAX) packet Take 17 g by mouth daily. Mix one tablespoon with 8oz of your favorite juice or water every day until you are having soft formed stools. Then start taking once daily if you didn't have a stool the day before. 30 each 0  . simvastatin (ZOCOR) 40 MG tablet     . torsemide (DEMADEX) 20 MG tablet Take 40 mg by mouth daily.    Marland Kitchen warfarin (COUMADIN) 5 MG tablet 5 mg at bedtime.    . ALPRAZolam (XANAX) 0.25 MG tablet  (Patient not taking: Reported on 08/21/2021)    . clobetasol ointment (TEMOVATE) AB-123456789 % Apply 1 application topically 2 (two) times daily. (Patient not taking: No sig reported) 30 g 0  . doxycycline (VIBRAMYCIN) 100 MG capsule Take 1 capsule (100 mg total) by mouth 2 (two)  times daily. (Patient not taking: No sig reported) 14 capsule 0  . furosemide (LASIX) 20 MG tablet Take 2 tablets by mouth. (Patient not taking: Reported on 08/21/2021)    . glycerin adult 2 g suppository Place 1 suppository rectally as needed for constipation. (Patient not taking: No sig reported) 12 suppository 0  . Iron-Vitamin C (VITRON-C) 65-125 MG TABS Take 1 tablet by mouth once a week. (Patient not taking: Reported on 08/21/2021)    . mupirocin ointment (BACTROBAN) 2 % Place 1 application into the nose 2 (two) times daily. (Patient not taking: No sig reported) 22 g 0  . ondansetron (ZOFRAN ODT) 4 MG disintegrating tablet Take 1 tablet (4 mg total) by mouth every 8 (eight) hours as needed for nausea or vomiting. (Patient not taking: No sig reported) 20 tablet 0  . potassium chloride (K-DUR,KLOR-CON) 10 MEQ tablet Take by mouth. (Patient not taking: Reported on 08/21/2021)    . triamcinolone cream (KENALOG) 0.5 % Apply 1 application topically 3 (three) times daily. (Patient not taking: No sig reported) 30 g 1   No current facility-administered medications on file prior to visit.    There are no Patient Instructions on file for this visit. No follow-ups on file.   Kris Hartmann, NP

## 2021-08-22 ENCOUNTER — Encounter (INDEPENDENT_AMBULATORY_CARE_PROVIDER_SITE_OTHER): Payer: Self-pay

## 2021-08-22 ENCOUNTER — Telehealth (INDEPENDENT_AMBULATORY_CARE_PROVIDER_SITE_OTHER): Payer: Self-pay

## 2021-08-22 NOTE — Telephone Encounter (Signed)
Patient daughter left a message stating that her father is weeping from his foot and ankle. The patient daughter was also checking on the status of his procedure. I spoke with Eulogio Ditch NP and she recommended for the patient to use ace bandage and elevate as much as he can. Patient is schedule left le angio with Dr Delana Meyer 8/30 arrival time 11:45 am. Pre-procedure instructions were gone over with patient daughter.

## 2021-08-23 DIAGNOSIS — L03116 Cellulitis of left lower limb: Secondary | ICD-10-CM | POA: Diagnosis not present

## 2021-08-23 DIAGNOSIS — I872 Venous insufficiency (chronic) (peripheral): Secondary | ICD-10-CM | POA: Diagnosis not present

## 2021-08-25 ENCOUNTER — Other Ambulatory Visit: Payer: Self-pay

## 2021-08-25 DIAGNOSIS — I10 Essential (primary) hypertension: Secondary | ICD-10-CM | POA: Insufficient documentation

## 2021-08-25 DIAGNOSIS — L7622 Postprocedural hemorrhage and hematoma of skin and subcutaneous tissue following other procedure: Secondary | ICD-10-CM | POA: Diagnosis not present

## 2021-08-25 DIAGNOSIS — Z79899 Other long term (current) drug therapy: Secondary | ICD-10-CM | POA: Diagnosis not present

## 2021-08-25 DIAGNOSIS — Z87891 Personal history of nicotine dependence: Secondary | ICD-10-CM | POA: Insufficient documentation

## 2021-08-25 DIAGNOSIS — I2510 Atherosclerotic heart disease of native coronary artery without angina pectoris: Secondary | ICD-10-CM | POA: Insufficient documentation

## 2021-08-25 DIAGNOSIS — E039 Hypothyroidism, unspecified: Secondary | ICD-10-CM | POA: Diagnosis not present

## 2021-08-25 DIAGNOSIS — Z955 Presence of coronary angioplasty implant and graft: Secondary | ICD-10-CM | POA: Diagnosis not present

## 2021-08-25 DIAGNOSIS — E1151 Type 2 diabetes mellitus with diabetic peripheral angiopathy without gangrene: Secondary | ICD-10-CM | POA: Diagnosis not present

## 2021-08-25 DIAGNOSIS — I4891 Unspecified atrial fibrillation: Secondary | ICD-10-CM | POA: Insufficient documentation

## 2021-08-25 LAB — CBC
HCT: 26.6 % — ABNORMAL LOW (ref 39.0–52.0)
Hemoglobin: 9.3 g/dL — ABNORMAL LOW (ref 13.0–17.0)
MCH: 36.9 pg — ABNORMAL HIGH (ref 26.0–34.0)
MCHC: 35 g/dL (ref 30.0–36.0)
MCV: 105.6 fL — ABNORMAL HIGH (ref 80.0–100.0)
Platelets: 209 10*3/uL (ref 150–400)
RBC: 2.52 MIL/uL — ABNORMAL LOW (ref 4.22–5.81)
RDW: 18.9 % — ABNORMAL HIGH (ref 11.5–15.5)
WBC: 11.3 10*3/uL — ABNORMAL HIGH (ref 4.0–10.5)
nRBC: 0 % (ref 0.0–0.2)

## 2021-08-25 LAB — BASIC METABOLIC PANEL
Anion gap: 8 (ref 5–15)
BUN: 24 mg/dL — ABNORMAL HIGH (ref 8–23)
CO2: 28 mmol/L (ref 22–32)
Calcium: 8.5 mg/dL — ABNORMAL LOW (ref 8.9–10.3)
Chloride: 100 mmol/L (ref 98–111)
Creatinine, Ser: 1.44 mg/dL — ABNORMAL HIGH (ref 0.61–1.24)
GFR, Estimated: 46 mL/min — ABNORMAL LOW (ref >60)
Glucose, Bld: 125 mg/dL — ABNORMAL HIGH (ref 70–99)
Potassium: 3.8 mmol/L (ref 3.5–5.1)
Sodium: 136 mmol/L (ref 135–145)

## 2021-08-25 LAB — PROTIME-INR
INR: 2 — ABNORMAL HIGH (ref 0.8–1.2)
Prothrombin Time: 22.6 seconds — ABNORMAL HIGH (ref 11.4–15.2)

## 2021-08-25 NOTE — ED Notes (Signed)
Pt's wound unwrapped and rewrapped due to pt's foot getting more red and swollen. Still bleeding if bandage removed but stable once gauze applied again. Pt reports it feels much better.

## 2021-08-25 NOTE — ED Triage Notes (Signed)
Pt comes pov with busted varicose veins. Pt states that he is on coumadin. Happens often and EMS has to come out and dress it. Today was harder to stop and pt's foot is causing him a lot of pain. Pt's foot swollen and red and goes up the calf. Pt also states that he has a procedure for stents in his veins on Monday.

## 2021-08-25 NOTE — ED Notes (Signed)
Pt assisted to bathroom at this time.

## 2021-08-26 ENCOUNTER — Emergency Department
Admission: EM | Admit: 2021-08-26 | Discharge: 2021-08-26 | Disposition: A | Discharge status: Home / Self Care | Payer: Medicare HMO | Attending: Emergency Medicine | Admitting: Emergency Medicine

## 2021-08-26 DIAGNOSIS — L7622 Postprocedural hemorrhage and hematoma of skin and subcutaneous tissue following other procedure: Secondary | ICD-10-CM | POA: Diagnosis not present

## 2021-08-26 DIAGNOSIS — T148XXA Other injury of unspecified body region, initial encounter: Secondary | ICD-10-CM

## 2021-08-26 MED ORDER — LIDOCAINE-EPINEPHRINE (PF) 2 %-1:200000 IJ SOLN
20.0000 mL | Freq: Once | INTRAMUSCULAR | Status: AC
  Administered 2021-08-26: 20 mL via INTRADERMAL
  Filled 2021-08-26: qty 20

## 2021-08-26 NOTE — Discharge Instructions (Addendum)
We placed a purse string suture to stop the bleeding from the blood vessel in your foot.  Follow-up with Dr. Delana Meyer for the angiogram on Tuesday as planned.  He can decide when to take out the suture.  Return to the ER for new, worsening, or persistent severe bleeding, worsening swelling or pain to the foot, fever, numbness, or any other new or worsening symptoms that concern you.

## 2021-08-26 NOTE — ED Provider Notes (Signed)
[facility] Emergency Department Provider Note ____________________________________________   Event Date/Time   First MD Initiated Contact with Patient 08/26/21 [phone]     (approximate)  I have reviewed the triage vital signs and the nursing notes.   HISTORY  Chief Complaint Varicose Veins    HPI [name] [name] is a 87 y.o. male with PMH as noted below including atrial fibrillation on Coumadin, CAD, diabetes, and peripheral artery disease presents with a bleeding lesion to his left foot, acute onset this afternoon.  The patient states that he has had multiple episodes of superficial veins bleeding on that leg before.  The bleeding stops with pressure but then resumes as soon as pressure is taken off.  He denies any other abnormal bleeding or bruising.  She does report some swelling to the leg which has been present for several weeks.  He states he has been treated for multiple infections of the leg, and scheduled for a procedure in 2 days.  Past Medical History:  Diagnosis Date   A-fib ([name] [name])    B12 deficiency    Coronary artery disease    HTN (hypertension), benign 04/25/2014   [name] deficiency anemia    Thyroid disease    Type 2 diabetes mellitus with peripheral angiopathy ([name] [name]) 11/10/2018    Patient Active Problem List   Diagnosis Date Noted   Atherosclerosis of native arteries of extremity with intermittent claudication ([name] [name]) 02/18/2021   Cataract cortical, senile 02/11/2021   Cor pulmonale, acute ([name] [name]) 05/02/2020   Type 2 diabetes mellitus with peripheral angiopathy ([name] [name]) 11/10/2018   Medicare annual wellness visit, initial 05/05/2017   Varicose veins of lower extremities with ulcer ([name] [name]) 04/23/2017   Chronic venous insufficiency 04/23/2017   Leg pain 04/23/2017   Lymphedema 04/23/2017   A-fib ([name] [name]) 04/23/2017   Hyperlipidemia 04/23/2017   Acquired hypothyroidism 11/03/2016   B12 deficiency 10/27/2014   CAD (coronary artery disease) 04/25/2014    GERD (gastroesophageal reflux disease) 04/25/2014   HTN (hypertension), benign 04/25/2014   S/P CABG x 3 02/26/1993    Past Surgical History:  Procedure Laterality Date   [name]      Prior to Admission medications   Medication Sig Start Date End Date Taking? Authorizing Provider  ALPRAZolam Duanne Moron) 0.25 MG tablet  03/31/14   [provider]  clobetasol ointment (TEMOVATE) AB-123456789 % Apply 1 application topically 2 (two) times daily. Patient not taking: No sig reported 04/21/17   Edrick Kins, DPM  Cyanocobalamin (VITAMIN B-12) 1000 MCG SUBL Take by mouth. 03/23/14   [provider]  doxycycline (VIBRAMYCIN) 100 MG capsule Take 1 capsule (100 mg total) by mouth 2 (two) times daily. Patient not taking: No sig reported 04/13/17   Edrick Kins, DPM  ferrous sulfate 325 (65 FE) MG EC tablet Take 325 mg by mouth daily with breakfast.    [provider]  furosemide (LASIX) 20 MG tablet Take 2 tablets by mouth. Patient not taking: Reported on 08/21/2021 07/31/20   [provider]  glycerin adult 2 g suppository Place 1 suppository rectally as needed for constipation. Patient not taking: No sig reported 06/01/18   Merlyn Lot, MD  [name]-Vitamin C (VITRON-C) 65-125 MG TABS Take 1 tablet by mouth once a week. Patient not taking: Reported on 08/21/2021 11/10/18   [provider]  levothyroxine (SYNTHROID, LEVOTHROID) 112 MCG tablet  03/28/14   [provider]  metoprolol succinate (TOPROL-XL) 50 MG 24 hr tablet  03/28/14   [provider]  Multiple Vitamins-Minerals (MULTIVITAMIN WITH MINERALS) tablet Take by mouth. 06/11/16   [provider]  mupirocin ointment (BACTROBAN) 2 % Place 1 application into the nose 2 (two) times daily. Patient not taking: No sig reported 04/07/17   Edrick Kins, DPM  nizatidine (AXID) 150 MG capsule Take 150 mg by mouth 2 (two) times daily.    [provider]   Omega-3 Fatty Acids (FISH OIL) 1000 MG CAPS Take by mouth in the morning and at bedtime.    [provider]  omeprazole (PRILOSEC) 20 MG capsule Take by mouth. 01/06/17 08/21/21  [provider]  ondansetron (ZOFRAN ODT) 4 MG disintegrating tablet Take 1 tablet (4 mg total) by mouth every 8 (eight) hours as needed for nausea or vomiting. Patient not taking: No sig reported 02/05/19   Paulette Blanch, MD  polyethylene glycol [facility] / GLYCOLAX) packet Take 17 g by mouth daily. Mix one tablespoon with 8oz of your favorite juice or water every day until you are having soft formed stools. Then start taking once daily if you didn't have a stool the day before. 06/01/18   Merlyn Lot, MD  potassium chloride (K-DUR,KLOR-CON) 10 MEQ tablet Take by mouth. Patient not taking: Reported on 08/21/2021 09/25/14   [provider]  simvastatin (ZOCOR) 40 MG tablet  03/28/14   [provider]  sulfamethoxazole-trimethoprim (BACTRIM) 400-80 MG tablet Take 1 tablet by mouth 2 (two) times daily. 08/21/21   Kris Hartmann, NP  torsemide (DEMADEX) 20 MG tablet Take 40 mg by mouth daily.    [provider]  triamcinolone cream (KENALOG) 0.5 % Apply 1 application topically 3 (three) times daily. Patient not taking: No sig reported 04/21/17   Edrick Kins, DPM  warfarin (COUMADIN) 5 MG tablet 5 mg at bedtime. 03/28/14   [provider]    Allergies Cephalexin, Spironolactone, Codeine, and Doxycycline  Family History  Problem Relation Age of Onset   Hypertension Mother    Diabetes Brother    Stroke Brother    Heart attack Brother     Social History Social History   Tobacco Use   Smoking status: Never   Smokeless tobacco: Never  Substance Use Topics   Alcohol use: No   Drug use: No    Review of Systems  Constitutional: No fever. Eyes: No visual changes. ENT: No sore throat. Cardiovascular: Denies chest pain. Respiratory: Denies shortness of  breath. Gastrointestinal: No vomiting or diarrhea.  Genitourinary: Negative for dysuria.  Musculoskeletal: Negative for back pain.  Positive for left leg pain. Skin: Negative for rash. Neurological: Negative for headaches, focal weakness or numbness.   ____________________________________________   PHYSICAL EXAM:  VITAL SIGNS: ED Triage Vitals  Enc Vitals Group     BP 08/25/21 1817 (!) 105/41     Pulse Rate 08/25/21 1817 67     Resp 08/25/21 1817 18     Temp 08/25/21 2113 98 F (36.7 C)     Temp Source 08/25/21 2113 Oral     SpO2 08/25/21 1817 97 %     Weight 08/25/21 1817 165 lb (74.8 kg)     Height 08/25/21 1817 '5\' 8"'$  (1.727 m)     Head Circumference --      Peak Flow --      Pain Score 08/25/21 1817 10     Pain Loc --      Pain Edu? --      Excl. in [place]? --  Constitutional: Alert and oriented. Well appearing for age and in no acute distress. Eyes: Conjunctivae are normal.  Head: Atraumatic. Nose: No congestion/rhinnorhea. Mouth/Throat: Mucous membranes are moist.   Neck: Normal range of motion.  Cardiovascular: Normal rate, regular rhythm.  Respiratory: Normal respiratory effort.  No retractions. Gastrointestinal: No distention.  Musculoskeletal: 1+ bilateral lower extremity edema.  Left foot with moderate swelling and faint erythema up to the lower leg proximal to the ankle.  No induration or abnormal warmth.  Good color.  Cap refill less than 2 seconds.  Faint bilateral DP pulses.  Punctate wound to the dorsal medial aspect of the left foot with tiny arterial bleeding. Neurologic:  Normal speech and language. No gross focal neurologic deficits are appreciated.  Skin:  Skin is warm and dry. No rash noted. Psychiatric: Calm and cooperative.  ____________________________________________   LABS (all labs ordered are listed, but only abnormal results are displayed)  Labs Reviewed  PROTIME-INR - Abnormal; Notable for the following components:      Result Value    Prothrombin Time 22.6 (*)    INR 2.0 (*)    All other components within normal limits  CBC - Abnormal; Notable for the following components:   WBC 11.3 (*)    RBC 2.52 (*)    Hemoglobin 9.3 (*)    HCT 26.6 (*)    MCV 105.6 (*)    MCH 36.9 (*)    RDW 18.9 (*)    All other components within normal limits  BASIC METABOLIC PANEL - Abnormal; Notable for the following components:   Glucose, Bld 125 (*)    BUN 24 (*)    Creatinine, Ser 1.44 (*)    Calcium 8.5 (*)    GFR, Estimated 46 (*)    All other components within normal limits   ____________________________________________  EKG   ____________________________________________  RADIOLOGY    ____________________________________________   PROCEDURES  Procedure(s) performed: Yes   Wound repair  Date/Time: 08/26/2021 7:11 AM Performed by: Arta Silence, MD Authorized by: Arta Silence, MD  Consent: Verbal consent obtained. Consent given by: patient Patient identity confirmed: verbally with patient Local anesthesia used: yes Anesthesia: local infiltration  Anesthesia: Local anesthesia used: yes Local Anesthetic: lidocaine 2% with epinephrine Anesthetic total: 5 mL  Sedation: Patient sedated: no  Patient tolerance: patient tolerated the procedure well with no immediate complications Comments: Single purse string suture placed around right foot wound.  Hemostasis achieved.    Critical Care performed: No ____________________________________________   INITIAL IMPRESSION / ASSESSMENT AND PLAN / ED COURSE  Pertinent labs & imaging results that were available during my care of the patient were reviewed by me and considered in my medical decision making (see chart for details).   87 year old male with PMH as noted above including atrial fibrillation on Coumadin, CAD, diabetes, and peripheral artery disease presents with a bleeding lesion to the left foot.  He has had similar episodes previously.  I  reviewed the past medical records in [place].  Patient has had no recent similar ED presentations for this, however triage RN noted that EMS had come to his assistance multiple times for similar bleeding.  He was seen by vascular surgery on 8/24 and diagnosed with severe atherosclerosis of both lower extremities associated with ulceration and tissue loss.  He is scheduled for angiography on 8/30.  On exam currently the vital signs are normal and the patient is well-appearing.  He has swelling and erythema to bilateral lower legs and feet, worse on  the left which appears to be subacute to chronic.  There are faint DP pulses bilaterally which also appears to be a chronic finding, but he has good color bilaterally with normal cap refill.  There is a tiny wound to the left foot with a small arterial bleeder.  Lab work-up is unremarkable except for slightly lower hemoglobin than baseline.  INR is just into therapeutic range.  Chemistry is unremarkable.  We will place a pursestring suture and reassess.  ----------------------------------------- 3:21 AM on 08/26/2021 -----------------------------------------  Pursestring suture placed in the wound with successful hemostasis.  A sterile dressing was placed on top of it.  The patient is feeling much better and is stable for discharge home.  He has follow-up with vascular surgery tomorrow for an angiogram.  I gave the patient and his family member thorough return precautions and they expressed understanding.  ____________________________________________   FINAL CLINICAL IMPRESSION(S) / ED DIAGNOSES  Final diagnoses:  Bleeding from wound      NEW MEDICATIONS STARTED DURING THIS VISIT:  Discharge Medication List as of 08/26/2021  3:20 AM       Note:  This document was prepared using Dragon voice recognition software and may include unintentional dictation errors.    Arta Silence, MD 08/26/21 [phone]

## 2021-08-26 NOTE — Telephone Encounter (Signed)
Daughter called stating that patient stood up and foot started bleeding uncontrollably. Daughter called ambulance because they could not stop the bleeding. EMT advised her to go to ED as they couldn't stop the bleeding as well. Patient and daughter sat in ED and purse string suture to stop the bleeding from blood vessel \. Daughter is calling to see if procedure will still go on tomorrow (GS).

## 2021-08-26 NOTE — ED Notes (Signed)
E-signature pad unavailable - Pt & support person verbalized understanding of D/C information - no additional concerns at this time.  

## 2021-08-26 NOTE — ED Notes (Signed)
Pts foot redressed by Dr. Cherylann Banas.

## 2021-08-27 ENCOUNTER — Other Ambulatory Visit (INDEPENDENT_AMBULATORY_CARE_PROVIDER_SITE_OTHER): Payer: Self-pay | Admitting: Nurse Practitioner

## 2021-08-27 ENCOUNTER — Other Ambulatory Visit: Payer: Self-pay

## 2021-08-27 ENCOUNTER — Encounter
Admission: RE | Disposition: A | Discharge status: Home / Self Care | Payer: Self-pay | Source: Home / Self Care | Attending: Vascular Surgery

## 2021-08-27 ENCOUNTER — Encounter: Payer: Self-pay | Admitting: Vascular Surgery

## 2021-08-27 ENCOUNTER — Ambulatory Visit
Admission: RE | Admit: 2021-08-27 | Discharge: 2021-08-27 | Disposition: A | Discharge status: Home / Self Care | Payer: Medicare HMO | Attending: Vascular Surgery | Admitting: Vascular Surgery

## 2021-08-27 DIAGNOSIS — E782 Mixed hyperlipidemia: Secondary | ICD-10-CM | POA: Insufficient documentation

## 2021-08-27 DIAGNOSIS — Z79890 Hormone replacement therapy (postmenopausal): Secondary | ICD-10-CM | POA: Insufficient documentation

## 2021-08-27 DIAGNOSIS — Z79899 Other long term (current) drug therapy: Secondary | ICD-10-CM | POA: Insufficient documentation

## 2021-08-27 DIAGNOSIS — I890 Lymphedema, not elsewhere classified: Secondary | ICD-10-CM | POA: Diagnosis not present

## 2021-08-27 DIAGNOSIS — Z881 Allergy status to other antibiotic agents status: Secondary | ICD-10-CM | POA: Insufficient documentation

## 2021-08-27 DIAGNOSIS — Z7901 Long term (current) use of anticoagulants: Secondary | ICD-10-CM | POA: Diagnosis not present

## 2021-08-27 DIAGNOSIS — Z885 Allergy status to narcotic agent status: Secondary | ICD-10-CM | POA: Insufficient documentation

## 2021-08-27 DIAGNOSIS — Z888 Allergy status to other drugs, medicaments and biological substances status: Secondary | ICD-10-CM | POA: Insufficient documentation

## 2021-08-27 DIAGNOSIS — L03116 Cellulitis of left lower limb: Secondary | ICD-10-CM | POA: Diagnosis not present

## 2021-08-27 DIAGNOSIS — L97909 Non-pressure chronic ulcer of unspecified part of unspecified lower leg with unspecified severity: Secondary | ICD-10-CM

## 2021-08-27 DIAGNOSIS — I70245 Atherosclerosis of native arteries of left leg with ulceration of other part of foot: Secondary | ICD-10-CM | POA: Insufficient documentation

## 2021-08-27 DIAGNOSIS — L97529 Non-pressure chronic ulcer of other part of left foot with unspecified severity: Secondary | ICD-10-CM | POA: Diagnosis not present

## 2021-08-27 DIAGNOSIS — I10 Essential (primary) hypertension: Secondary | ICD-10-CM | POA: Diagnosis not present

## 2021-08-27 DIAGNOSIS — Z539 Procedure and treatment not carried out, unspecified reason: Secondary | ICD-10-CM | POA: Diagnosis not present

## 2021-08-27 DIAGNOSIS — I70299 Other atherosclerosis of native arteries of extremities, unspecified extremity: Secondary | ICD-10-CM

## 2021-08-27 LAB — CREATININE, SERUM
Creatinine, Ser: 1.6 mg/dL — ABNORMAL HIGH (ref 0.61–1.24)
GFR, Estimated: 41 mL/min — ABNORMAL LOW (ref >60)

## 2021-08-27 LAB — BUN: BUN: 24 mg/dL — ABNORMAL HIGH (ref 8–23)

## 2021-08-27 SURGERY — LOWER EXTREMITY ANGIOGRAPHY
Anesthesia: Moderate Sedation | Site: Leg Lower | Laterality: Left

## 2021-08-27 MED ORDER — ONDANSETRON HCL 4 MG/2ML IJ SOLN: 4.0000 mg | Freq: Four times a day (QID) | INTRAMUSCULAR | Status: DC | PRN

## 2021-08-27 MED ORDER — DIPHENHYDRAMINE HCL 50 MG/ML IJ SOLN: 50.0000 mg | Freq: Once | INTRAMUSCULAR | Status: DC | PRN

## 2021-08-27 MED ORDER — MIDAZOLAM HCL 2 MG/ML PO SYRP: 8.0000 mg | ORAL_SOLUTION | Freq: Once | ORAL | Status: DC | PRN

## 2021-08-27 MED ORDER — FENTANYL CITRATE PF 50 MCG/ML IJ SOSY
12.5000 ug | PREFILLED_SYRINGE | Freq: Once | INTRAMUSCULAR | Status: DC | PRN
Start: 2021-08-27 — End: 2021-08-27

## 2021-08-27 MED ORDER — CLINDAMYCIN PHOSPHATE 300 MG/50ML IV SOLN
INTRAVENOUS | Status: AC
  Filled 2021-08-27: qty 50

## 2021-08-27 MED ORDER — METHYLPREDNISOLONE SODIUM SUCC 125 MG IJ SOLR: 125.0000 mg | Freq: Once | INTRAMUSCULAR | Status: DC | PRN

## 2021-08-27 MED ORDER — FAMOTIDINE 20 MG PO TABS: 40.0000 mg | ORAL_TABLET | Freq: Once | ORAL | Status: DC | PRN

## 2021-08-27 MED ORDER — CLINDAMYCIN PHOSPHATE 300 MG/50ML IV SOLN: 300.0000 mg | Freq: Once | INTRAVENOUS | Status: DC

## 2021-08-27 MED ORDER — SODIUM CHLORIDE 0.9 % IV SOLN: INTRAVENOUS | Status: DC

## 2021-08-27 NOTE — Interval H&P Note (Signed)
History and Physical Interval Note:  08/27/2021 2:29 PM  [name] [name]  has presented today for surgery, with the diagnosis of LLE Angio   BARD    ASO w ulceration.  The various methods of treatment have been discussed with the patient and family. After consideration of risks, benefits and other options for treatment, the patient has consented to  Procedure(s): LOWER EXTREMITY ANGIOGRAPHY (Left) as a surgical intervention.  The patient's history has been reviewed, patient examined, no change in status, stable for surgery.  I have reviewed the patient's chart and labs.  Questions were answered to the patient's satisfaction.     Hortencia Pilar

## 2021-08-27 NOTE — Progress Notes (Addendum)
Pt. Procedure cancelled for today secondary to MD multiple emergencies. Pt. To be rescheduled for this Friday 08/30/2021. Pt. To remain off coumadin & "take eliquis tonight and tomorrow AM" only for procedure in 3 days (08/30/21)per Dr. Delana Meyer.. Pt. & daughter [name] [name] agreeable & verbalized full understanding of new instructions. Per family request,Left foot redressed in sterile 4x4's x 3, kerlix & Koban .  Pt daughter [name] states "Dad was just in the ER for 6-8 hrs. Sunday night because the side of his foot would not stop bleeding :They had to put a stitch in it." Pt. Stable for DC home now with family (2 daughters).

## 2021-08-30 ENCOUNTER — Encounter: Payer: Self-pay | Admitting: Vascular Surgery

## 2021-08-30 ENCOUNTER — Encounter
Admission: RE | Disposition: A | Discharge status: Home / Self Care | Payer: Self-pay | Source: Home / Self Care | Attending: Vascular Surgery

## 2021-08-30 ENCOUNTER — Observation Stay
Admission: RE | Admit: 2021-08-30 | Discharge: 2021-08-31 | Disposition: A | Discharge status: Home / Self Care | Payer: Medicare HMO | Attending: Vascular Surgery | Admitting: Vascular Surgery

## 2021-08-30 ENCOUNTER — Other Ambulatory Visit: Payer: Self-pay

## 2021-08-30 ENCOUNTER — Other Ambulatory Visit (INDEPENDENT_AMBULATORY_CARE_PROVIDER_SITE_OTHER): Payer: Self-pay | Admitting: Vascular Surgery

## 2021-08-30 DIAGNOSIS — L97909 Non-pressure chronic ulcer of unspecified part of unspecified lower leg with unspecified severity: Secondary | ICD-10-CM

## 2021-08-30 DIAGNOSIS — I70229 Atherosclerosis of native arteries of extremities with rest pain, unspecified extremity: Secondary | ICD-10-CM | POA: Diagnosis present

## 2021-08-30 DIAGNOSIS — Z79899 Other long term (current) drug therapy: Secondary | ICD-10-CM | POA: Insufficient documentation

## 2021-08-30 DIAGNOSIS — I10 Essential (primary) hypertension: Secondary | ICD-10-CM | POA: Insufficient documentation

## 2021-08-30 DIAGNOSIS — I70248 Atherosclerosis of native arteries of left leg with ulceration of other part of lower left leg: Secondary | ICD-10-CM | POA: Insufficient documentation

## 2021-08-30 DIAGNOSIS — Z7901 Long term (current) use of anticoagulants: Secondary | ICD-10-CM | POA: Insufficient documentation

## 2021-08-30 DIAGNOSIS — L97829 Non-pressure chronic ulcer of other part of left lower leg with unspecified severity: Secondary | ICD-10-CM | POA: Diagnosis not present

## 2021-08-30 DIAGNOSIS — Z881 Allergy status to other antibiotic agents status: Secondary | ICD-10-CM | POA: Insufficient documentation

## 2021-08-30 DIAGNOSIS — I70299 Other atherosclerosis of native arteries of extremities, unspecified extremity: Secondary | ICD-10-CM

## 2021-08-30 DIAGNOSIS — E782 Mixed hyperlipidemia: Secondary | ICD-10-CM | POA: Insufficient documentation

## 2021-08-30 DIAGNOSIS — Z885 Allergy status to narcotic agent status: Secondary | ICD-10-CM | POA: Insufficient documentation

## 2021-08-30 DIAGNOSIS — Z8249 Family history of ischemic heart disease and other diseases of the circulatory system: Secondary | ICD-10-CM | POA: Diagnosis not present

## 2021-08-30 DIAGNOSIS — E1151 Type 2 diabetes mellitus with diabetic peripheral angiopathy without gangrene: Principal | ICD-10-CM | POA: Insufficient documentation

## 2021-08-30 DIAGNOSIS — Z888 Allergy status to other drugs, medicaments and biological substances status: Secondary | ICD-10-CM | POA: Diagnosis not present

## 2021-08-30 DIAGNOSIS — I890 Lymphedema, not elsewhere classified: Secondary | ICD-10-CM | POA: Insufficient documentation

## 2021-08-30 DIAGNOSIS — L03116 Cellulitis of left lower limb: Secondary | ICD-10-CM | POA: Insufficient documentation

## 2021-08-30 DIAGNOSIS — Z79890 Hormone replacement therapy (postmenopausal): Secondary | ICD-10-CM | POA: Insufficient documentation

## 2021-08-30 DIAGNOSIS — I70223 Atherosclerosis of native arteries of extremities with rest pain, bilateral legs: Secondary | ICD-10-CM | POA: Insufficient documentation

## 2021-08-30 DIAGNOSIS — I70243 Atherosclerosis of native arteries of left leg with ulceration of ankle: Secondary | ICD-10-CM | POA: Diagnosis not present

## 2021-08-30 HISTORY — PX: LOWER EXTREMITY ANGIOGRAPHY: CATH118251

## 2021-08-30 LAB — GLUCOSE, CAPILLARY: Glucose-Capillary: 113 mg/dL — ABNORMAL HIGH (ref 70–99)

## 2021-08-30 SURGERY — LOWER EXTREMITY ANGIOGRAPHY
Anesthesia: Moderate Sedation | Site: Leg Lower | Laterality: Left

## 2021-08-30 MED ORDER — PANTOPRAZOLE SODIUM 40 MG PO TBEC
40.0000 mg | DELAYED_RELEASE_TABLET | Freq: Every day | ORAL | Status: DC
  Administered 2021-08-31: 40 mg via ORAL
  Filled 2021-08-30: qty 1

## 2021-08-30 MED ORDER — SIMVASTATIN 40 MG PO TABS
40.0000 mg | ORAL_TABLET | Freq: Every day | ORAL | Status: DC
  Administered 2021-08-30: 20:00:00 40 mg via ORAL
  Filled 2021-08-30 (×2): qty 1
  Filled 2021-08-30: qty 2

## 2021-08-30 MED ORDER — ONDANSETRON HCL 4 MG/2ML IJ SOLN: 4.0000 mg | Freq: Four times a day (QID) | INTRAMUSCULAR | Status: DC | PRN

## 2021-08-30 MED ORDER — FENTANYL CITRATE PF 50 MCG/ML IJ SOSY
PREFILLED_SYRINGE | INTRAMUSCULAR | Status: AC
  Filled 2021-08-30: qty 1

## 2021-08-30 MED ORDER — MIDAZOLAM HCL 2 MG/2ML IJ SOLN
INTRAMUSCULAR | Status: AC
  Filled 2021-08-30: qty 2

## 2021-08-30 MED ORDER — SODIUM CHLORIDE 0.9 % IV SOLN: 250.0000 mL | INTRAVENOUS | Status: DC | PRN

## 2021-08-30 MED ORDER — HEPARIN SODIUM (PORCINE) 1000 UNIT/ML IJ SOLN
INTRAMUSCULAR | Status: DC | PRN
  Administered 2021-08-30: 4000 [IU] via INTRAVENOUS
  Administered 2021-08-30: 2000 [IU] via INTRAVENOUS

## 2021-08-30 MED ORDER — OXYCODONE HCL 5 MG PO TABS
5.0000 mg | ORAL_TABLET | ORAL | Status: DC | PRN
  Administered 2021-08-30: 20:00:00 5 mg via ORAL
  Filled 2021-08-30: qty 2

## 2021-08-30 MED ORDER — SODIUM CHLORIDE 0.9 % IV SOLN: INTRAVENOUS | Status: AC

## 2021-08-30 MED ORDER — CLINDAMYCIN PHOSPHATE 300 MG/50ML IV SOLN
INTRAVENOUS | Status: AC
  Administered 2021-08-30: 300 mg via INTRAVENOUS
  Filled 2021-08-30: qty 50

## 2021-08-30 MED ORDER — LEVOTHYROXINE SODIUM 112 MCG PO TABS
112.0000 ug | ORAL_TABLET | Freq: Every day | ORAL | Status: DC
  Administered 2021-08-31: 112 ug via ORAL
  Filled 2021-08-30 (×2): qty 1

## 2021-08-30 MED ORDER — HYDROMORPHONE HCL 1 MG/ML IJ SOLN: 1.0000 mg | Freq: Once | INTRAMUSCULAR | Status: DC | PRN

## 2021-08-30 MED ORDER — METOPROLOL SUCCINATE ER 50 MG PO TB24
50.0000 mg | ORAL_TABLET | Freq: Every day | ORAL | Status: DC
  Administered 2021-08-31: 50 mg via ORAL
  Filled 2021-08-30: qty 1

## 2021-08-30 MED ORDER — IODIXANOL 320 MG/ML IV SOLN
INTRAVENOUS | Status: DC | PRN
  Administered 2021-08-30: 55 mL via INTRA_ARTERIAL

## 2021-08-30 MED ORDER — ACETAMINOPHEN 325 MG PO TABS
650.0000 mg | ORAL_TABLET | ORAL | Status: DC | PRN
  Administered 2021-08-30: 22:00:00 650 mg via ORAL
  Filled 2021-08-30: qty 2

## 2021-08-30 MED ORDER — OXYCODONE HCL 5 MG PO TABS: 5.0000 mg | ORAL_TABLET | ORAL | Status: DC | PRN

## 2021-08-30 MED ORDER — MIDAZOLAM HCL 2 MG/ML PO SYRP: 8.0000 mg | ORAL_SOLUTION | Freq: Once | ORAL | Status: DC | PRN

## 2021-08-30 MED ORDER — MIDAZOLAM HCL 2 MG/2ML IJ SOLN
INTRAMUSCULAR | Status: DC | PRN
  Administered 2021-08-30: 0.5 mg via INTRAVENOUS
  Administered 2021-08-30: 1 mg via INTRAVENOUS
  Administered 2021-08-30: 0.5 mg via INTRAVENOUS

## 2021-08-30 MED ORDER — SODIUM CHLORIDE 0.9% FLUSH: 3.0000 mL | Freq: Two times a day (BID) | INTRAVENOUS | Status: DC

## 2021-08-30 MED ORDER — DIPHENHYDRAMINE HCL 50 MG/ML IJ SOLN: 50.0000 mg | Freq: Once | INTRAMUSCULAR | Status: DC | PRN

## 2021-08-30 MED ORDER — SODIUM CHLORIDE 0.9 % IV SOLN: INTRAVENOUS | Status: DC

## 2021-08-30 MED ORDER — FENTANYL CITRATE (PF) 100 MCG/2ML IJ SOLN
INTRAMUSCULAR | Status: DC | PRN
  Administered 2021-08-30: 25 ug via INTRAVENOUS
  Administered 2021-08-30: 50 ug via INTRAVENOUS
  Administered 2021-08-30 (×3): 25 ug via INTRAVENOUS

## 2021-08-30 MED ORDER — MORPHINE SULFATE (PF) 4 MG/ML IV SOLN
2.0000 mg | INTRAVENOUS | Status: DC | PRN
Start: 2021-08-30 — End: 2021-08-30

## 2021-08-30 MED ORDER — MORPHINE SULFATE (PF) 2 MG/ML IV SOLN
INTRAVENOUS | Status: AC
  Administered 2021-08-30: 1 mg via INTRAVENOUS
  Filled 2021-08-30: qty 1

## 2021-08-30 MED ORDER — HEPARIN SODIUM (PORCINE) 1000 UNIT/ML IJ SOLN
INTRAMUSCULAR | Status: AC
  Filled 2021-08-30: qty 1

## 2021-08-30 MED ORDER — FAMOTIDINE 20 MG PO TABS: 40.0000 mg | ORAL_TABLET | Freq: Once | ORAL | Status: DC | PRN

## 2021-08-30 MED ORDER — SODIUM CHLORIDE 0.9% FLUSH: 3.0000 mL | INTRAVENOUS | Status: DC | PRN

## 2021-08-30 MED ORDER — TORSEMIDE 20 MG PO TABS
40.0000 mg | ORAL_TABLET | Freq: Every day | ORAL | Status: DC
  Administered 2021-08-30 – 2021-08-31 (×2): 40 mg via ORAL
  Filled 2021-08-30 (×2): qty 2

## 2021-08-30 MED ORDER — ASPIRIN EC 81 MG PO TBEC
81.0000 mg | DELAYED_RELEASE_TABLET | Freq: Every day | ORAL | Status: DC
  Administered 2021-08-30 – 2021-08-31 (×2): 81 mg via ORAL
  Filled 2021-08-30 (×2): qty 1

## 2021-08-30 MED ORDER — MORPHINE SULFATE (PF) 4 MG/ML IV SOLN
2.0000 mg | INTRAVENOUS | Status: DC | PRN
Start: 2021-08-30 — End: 2021-08-31
  Filled 2021-08-30: qty 1

## 2021-08-30 MED ORDER — FENTANYL CITRATE (PF) 100 MCG/2ML IJ SOLN
INTRAMUSCULAR | Status: AC
  Filled 2021-08-30: qty 2

## 2021-08-30 MED ORDER — METHYLPREDNISOLONE SODIUM SUCC 125 MG IJ SOLR: 125.0000 mg | Freq: Once | INTRAMUSCULAR | Status: DC | PRN

## 2021-08-30 MED ORDER — ACETAMINOPHEN 325 MG PO TABS: 650.0000 mg | ORAL_TABLET | ORAL | Status: DC | PRN

## 2021-08-30 MED ORDER — CLINDAMYCIN PHOSPHATE 300 MG/50ML IV SOLN: 300.0000 mg | Freq: Once | INTRAVENOUS | Status: AC

## 2021-08-30 SURGICAL SUPPLY — 38 items
BALLN LUTONIX 018 4X150X130 (BALLOONS) ×2
BALLN LUTONIX 018 5X220X130 (BALLOONS) ×2
BALLN ULTRASCOR 014 3.5X40X150 (BALLOONS) ×2
BALLN ULTRVRSE 2.5X40X150 (BALLOONS) ×2
BALLN ULTRVRSE 3X40X150 (BALLOONS) ×2
BALLN ULTRVRSE 3X40X150 OTW (BALLOONS) ×1
BALLOON LUTONIX 018 4X150X130 (BALLOONS) ×1 IMPLANT
BALLOON LUTONIX 018 5X220X130 (BALLOONS) ×1 IMPLANT
BALLOON ULTRSCR 014 3.5X40X150 (BALLOONS) ×1 IMPLANT
BALLOON ULTRVRSE 2.5X40X150 (BALLOONS) ×1 IMPLANT
BALLOON ULTRVRSE 3X40X150 OTW (BALLOONS) ×1 IMPLANT
CATH 0.018 NAVICROSS ANG 135 (CATHETERS) ×2 IMPLANT
CATH ANGIO 5F PIGTAIL 65CM (CATHETERS) ×2 IMPLANT
CATH TEMPO 5F RIM 65CM (CATHETERS) ×2 IMPLANT
CATH VERT 5FR 125CM (CATHETERS) ×2 IMPLANT
CATH VERT 5X100 (CATHETERS) ×2 IMPLANT
COVER PROBE U/S 5X48 (MISCELLANEOUS) ×4 IMPLANT
DEVICE SAFEGUARD 24CM (GAUZE/BANDAGES/DRESSINGS) ×2 IMPLANT
DEVICE STARCLOSE SE CLOSURE (Vascular Products) ×2 IMPLANT
DEVICE TORQUE .025-.038 (MISCELLANEOUS) ×2 IMPLANT
DRAPE INCISE IOBAN 66X45 STRL (DRAPES) ×2 IMPLANT
GLIDEWIRE ADV .035X260CM (WIRE) ×2 IMPLANT
GLIDEWIRE ANGLED SS 035X260CM (WIRE) ×2 IMPLANT
GUIDEWIRE PFTE-COATED .018X300 (WIRE) ×4 IMPLANT
KIT ENCORE 26 ADVANTAGE (KITS) ×2 IMPLANT
KIT MICROPUNCTURE NIT STIFF (SHEATH) ×2 IMPLANT
NEEDLE ENTRY 21GA 7CM ECHOTIP (NEEDLE) ×2 IMPLANT
PACK ANGIOGRAPHY (CUSTOM PROCEDURE TRAY) ×2 IMPLANT
SHEATH BRITE TIP 5FRX11 (SHEATH) ×2 IMPLANT
SHEATH HALO 035 5FRX10 (SHEATH) ×2 IMPLANT
SHEATH MICROPUNCTURE PEDAL 4FR (SHEATH) ×2 IMPLANT
SHEATH RAABE 6FR (SHEATH) ×2 IMPLANT
STENT LIFESTENT 7X150X130 (Permanent Stent) ×2 IMPLANT
SYR MEDRAD MARK 7 150ML (SYRINGE) ×2 IMPLANT
TOWEL OR 17X26 4PK STRL BLUE (TOWEL DISPOSABLE) ×4 IMPLANT
TUBING CONTRAST HIGH PRESS 48 (TUBING) ×4 IMPLANT
WIRE GUIDERIGHT .035X150 (WIRE) ×2 IMPLANT
WIRE RUNTHROUGH .014X300CM (WIRE) ×2 IMPLANT

## 2021-08-30 NOTE — Interval H&P Note (Signed)
History and Physical Interval Note:  08/30/2021 12:27 PM  MATTHIAS COWSERT  has presented today for surgery, with the diagnosis of LLE Angio   BARD    ASO w ulceration.  The various methods of treatment have been discussed with the patient and family. After consideration of risks, benefits and other options for treatment, the patient has consented to  Procedure(s): LOWER EXTREMITY ANGIOGRAPHY (Left) as a surgical intervention.  The patient's history has been reviewed, patient examined, no change in status, stable for surgery.  I have reviewed the patient's chart and labs.  Questions were answered to the patient's satisfaction.     Hortencia Pilar

## 2021-08-30 NOTE — Interval H&P Note (Signed)
History and Physical Interval Note:  08/30/2021 12:27 PM  [name] [name]  has presented today for surgery, with the diagnosis of LLE Angio   BARD    ASO w ulceration.  The various methods of treatment have been discussed with the patient and family. After consideration of risks, benefits and other options for treatment, the patient has consented to  Procedure(s): LOWER EXTREMITY ANGIOGRAPHY (Left) as a surgical intervention.  The patient's history has been reviewed, patient examined, no change in status, stable for surgery.  I have reviewed the patient's chart and labs.  Questions were answered to the patient's satisfaction.     Hortencia Pilar

## 2021-08-30 NOTE — Op Note (Signed)
[facility]  Percutaneous Study/Intervention Procedural Note   Date of Surgery: 08/30/2021  Surgeon:  Hortencia Pilar  Pre-operative Diagnosis: Atherosclerotic occlusive disease bilateral lower extremities with rest pain and ulceration of the left lower extremity  Post-operative diagnosis:  Same  Procedure(s) Performed:             1.  Introduction catheter into left lower extremity 3rd order catheter placement               2.    Contrast injection left lower extremity for distal runoff             3.  Percutaneous transluminal angioplasty and stent placement left superficial femoral and popliteal arteries to 5 mm using a 7 mm x 150 mm life stent             4.  Percutaneous transluminal angioplasty left tibioperoneal trunk to 3.5 mm with an ultra score balloon             5.  Star close closure right common femoral arteriotomy  Anesthesia: Conscious sedation was administered under my direct supervision by the interventional radiology RN. IV Versed plus fentanyl were utilized. Continuous ECG, pulse oximetry and blood pressure was monitored throughout the entire procedure.  Conscious sedation was for a total of 129 minutes.  Sheath: 6 [place] Rabie right common femoral retrograde  Contrast: 55 cc  Fluoroscopy Time: 32.8 minutes  Indications:  [name] [name] presents with increasing rest pain of the left lower extremity.  He is also developed multiple ulcerations of the ankle.  Noninvasive studies demonstrate near flat tracings.  This suggests the patient is having limb threatening ischemia. The risks and benefits are reviewed all questions answered patient agrees to proceed.  Procedure: [name] [name] is a 87 y.o. y.o. male who was identified and appropriate procedural time out was performed.  The patient was then placed supine on the table and prepped and draped in the usual sterile fashion.    Ultrasound was placed in the sterile sleeve and the right groin  was evaluated the right common femoral artery was echolucent and pulsatile indicating patency.  Image was recorded for the permanent record and under real-time visualization a microneedle was inserted into the common femoral artery followed by the microwire and then the micro-sheath.  A J-wire was then advanced through the micro-sheath and a  5 [place] sheath was then inserted over a J-wire. J-wire was then advanced and a 5 French pigtail catheter was positioned at the level of T12.  AP projection of the aorta was then obtained. Pigtail catheter was repositioned to above the bifurcation and a RAO view of the pelvis was obtained.  Subsequently a rim catheter with the stiff angle Glidewire was used to cross the aortic bifurcation the catheter wire were advanced down into the left distal external iliac artery. Oblique view of the femoral bifurcation was then obtained and subsequently the wire was reintroduced and the pigtail catheter negotiated into the SFA representing third order catheter placement. Distal runoff was then performed.  Diagnostic Interpretation: The abdominal aorta is opacified with a bolus injection of contrast.  There is diffuse calcific disease seen on fluoroscopy but there are no hemodynamically significant stenoses.  The renal arteries are noted the right demonstrates a moderate stenosis at its origin the left is poorly characterized.  The aortic bifurcation is again noted to be diffusely calcified but there are no hemodynamically significant stenoses.  Bilateral common and external  and internal iliac arteries are similar widely patent without hemodynamically significant stenosis but diffuse calcific disease.  The left common femoral and profunda femoris are diffusely calcified with mild to moderate patchy disease but no hemodynamically significant stenoses.  The proximal two thirds of the SFA on the left is widely patent.  At approximately the level of Hunter's canal profound calcific disease  is identified with an occlusion over 4 to 6 cm.  There is greater than 70% diffuse disease through the above-knee popliteal to the mid knee level.  The distal portion of the popliteal is diffusely diseased but patent without hemodynamically significant stenosis.  The trifurcation is profoundly diseased with occlusion of the anterior tibial just after its takeoff.  The tibioperoneal trunk demonstrates a greater than 90% stenosis over a distance of approximately 3 cm.  Posterior tibial and peroneal are both patent and the posterior tibial is the dominant runoff to the foot again it is profoundly calcified on plain fluoroscopy however there are no hemodynamically significant stenoses and it fills the plantar vessels and the pedal arch.  The peroneal is patent down to the ankle but contributes very little to the foot itself.  5000 units of heparin was then given and allowed to circulate and a 6 [place] Rabie sheath was advanced up and over the bifurcation and positioned in the femoral artery  The detector was then repositioned and the SFA and popliteal was reimaged the occlusion with the associated critical tandem lesions beginning at Hunter's canal is again noted.  There is lesion was quite challenging to cross using a combination of both advantage wires and glide wires different catheters different size wires as well.  At 1 point I attempted to access the posterior tibial for a pedal approach but I was unable to accomplish pedal access.  Eventually I was able to negotiate a 0.035 stiff angled Glidewire with a vertebral catheter through the lesion and verified intraluminal positioning in the popliteal artery just below the level of the tibial plateau.  This lesion was then predilated with a 4 mm x 150 mm Lutonix drug-eluting balloon.  I then used the balloon to exchange the 0.035 advantage wire for a 0.014 run-through wire.  This wire was then negotiated into the distal peroneal.  I then advanced a 7 mm x 150 mm  life stent across the lesion and deployed it without difficulty.  The life stent was then postdilated with a 5 mm x 220 mm Lutonix drug-eluting balloon inflated to 10 atm for 1 full minute.  Follow-up imaging demonstrated less than 20% residual stenosis throughout the SFA and popliteal and I turned my attention to the lesion in the tibioperoneal trunk.  I then selected a 3.5 mm x 40 mm ultra score balloon to treat the lesion in the tibial peroneal trunk.  However this balloon would not cross the lesion and so I exchanged the balloon for a 2 mm x 40 mm Ultraverse balloon.  Inflation was to 12 atm for approximately 1 minute.  Next I readvanced the 3.5 mm x 40 mm ultra score balloon this time it went quite easily and inflated this balloon to 10 atm for 1 minute.  Follow-up imaging demonstrated less than 20% residual stenosis with preservation of two-vessel runoff via the posterior tibial and peroneal.  At this point I elected to terminate the procedure.  After review of these images the sheath is pulled into the right external iliac oblique of the common femoral is obtained and a Star close  device deployed. There no immediate Complications.  Findings:   The abdominal aorta is opacified with a bolus injection of contrast.  There is diffuse calcific disease seen on fluoroscopy but there are no hemodynamically significant stenoses.  The renal arteries are noted the right demonstrates a moderate stenosis at its origin the left is poorly characterized.  The aortic bifurcation is again noted to be diffusely calcified but there are no hemodynamically significant stenoses.  Bilateral common and external and internal iliac arteries are similar widely patent without hemodynamically significant stenosis but diffuse calcific disease.  The left common femoral and profunda femoris are diffusely calcified with mild to moderate patchy disease but no hemodynamically significant stenoses.  The proximal two thirds of the SFA on  the left is widely patent.  At approximately the level of Hunter's canal profound calcific disease is identified with an occlusion over 4 to 6 cm.  There is greater than 70% diffuse disease through the above-knee popliteal to the mid knee level.  The distal portion of the popliteal is diffusely diseased but patent without hemodynamically significant stenosis.  The trifurcation is profoundly diseased with occlusion of the anterior tibial just after its takeoff.  The tibioperoneal trunk demonstrates a greater than 90% stenosis over a distance of approximately 3 cm.  Posterior tibial and peroneal are both patent and the posterior tibial is the dominant runoff to the foot again it is profoundly calcified on plain fluoroscopy however there are no hemodynamically significant stenoses and it fills the plantar vessels and the pedal arch.  The peroneal is patent down to the ankle but contributes very little to the foot itself.  Following angioplasty to 3.5 mm the tibioperoneal trunk now is in-line flow and looks quite nice with less than 20% residual stenosis. Angioplasty and stent placement of the SFA at Hunter's canal as well as the proximal popliteal this yields an excellent result with less than 20% residual stenosis.  Summary: Successful recanalization left lower extremity for limb salvage                           Disposition: Patient was taken to the recovery room in stable condition having tolerated the procedure well.  [name] [name] 08/30/2021,3:32 PM

## 2021-08-31 DIAGNOSIS — L03116 Cellulitis of left lower limb: Secondary | ICD-10-CM | POA: Diagnosis not present

## 2021-08-31 DIAGNOSIS — Z7901 Long term (current) use of anticoagulants: Secondary | ICD-10-CM | POA: Diagnosis not present

## 2021-08-31 DIAGNOSIS — E782 Mixed hyperlipidemia: Secondary | ICD-10-CM | POA: Diagnosis not present

## 2021-08-31 DIAGNOSIS — L97829 Non-pressure chronic ulcer of other part of left lower leg with unspecified severity: Secondary | ICD-10-CM | POA: Diagnosis not present

## 2021-08-31 DIAGNOSIS — I10 Essential (primary) hypertension: Secondary | ICD-10-CM | POA: Diagnosis not present

## 2021-08-31 DIAGNOSIS — I890 Lymphedema, not elsewhere classified: Secondary | ICD-10-CM | POA: Diagnosis not present

## 2021-08-31 DIAGNOSIS — I70223 Atherosclerosis of native arteries of extremities with rest pain, bilateral legs: Secondary | ICD-10-CM | POA: Diagnosis not present

## 2021-08-31 DIAGNOSIS — E1151 Type 2 diabetes mellitus with diabetic peripheral angiopathy without gangrene: Secondary | ICD-10-CM | POA: Diagnosis not present

## 2021-08-31 DIAGNOSIS — I70248 Atherosclerosis of native arteries of left leg with ulceration of other part of lower left leg: Secondary | ICD-10-CM | POA: Diagnosis not present

## 2021-08-31 MED ORDER — ASPIRIN 81 MG PO TBEC
81.0000 mg | DELAYED_RELEASE_TABLET | Freq: Every day | ORAL | 11 refills | Status: DC
Start: 2021-09-01 — End: 2022-09-12

## 2021-08-31 MED ORDER — APIXABAN 5 MG PO TABS
5.0000 mg | ORAL_TABLET | Freq: Two times a day (BID) | ORAL | Status: DC
  Filled 2021-08-31: qty 1

## 2021-08-31 NOTE — Discharge Summary (Signed)
Physician Discharge Summary  Patient ID: [name] [name] MRN: [id number] DOB/AGE: 1929-03-20 87 y.o.  Admit date: 08/30/2021 Discharge date: 08/31/2021  Admission Diagnoses:  Discharge Diagnoses:  Active Problems:   Atherosclerosis of artery of extremity with rest pain [facility])   Discharged Condition: good  Hospital Course: 87 year old who underwent left leg angiogram with stenting.  He was kept overnight on heparin.  He had no acute events  Consults: None  Significant Diagnostic Studies: angiography:   Treatments: angio with left leg stenting  Discharge Exam: Blood pressure (!) 101/53, pulse (!) 58, temperature 99.1 F (37.3 C), resp. rate 18, height '5\' 8"'$  (1.727 m), weight 74.8 kg, SpO2 100 %. Left groin without hematoma  Disposition:    Allergies as of 08/31/2021       Reactions   Cephalexin Rash   Spironolactone Nausea Only   Codeine Rash, Other (See Comments)   Can not sleep   Doxycycline Rash        Medication List     TAKE these medications    acetaminophen 500 MG tablet Commonly known as: TYLENOL Take 1,000 mg by mouth every 6 (six) hours as needed for moderate pain.   apixaban 5 MG Tabs tablet Commonly known as: ELIQUIS Take 5 mg by mouth 2 (two) times daily.   aspirin 81 MG EC tablet Take 1 tablet (81 mg total) by mouth daily. Swallow whole. Start taking on: September 01, 2021   cetirizine 10 MG tablet Commonly known as: ZYRTEC Take 10 mg by mouth daily as needed for allergies.   ferrous sulfate 325 (65 FE) MG EC tablet Take 325 mg by mouth daily.   Fish Oil 1000 MG Caps Take 1,000 mg by mouth in the morning and at bedtime.   levothyroxine 112 MCG tablet Commonly known as: SYNTHROID Take 112 mcg by mouth daily.   metoprolol succinate 50 MG 24 hr tablet Commonly known as: TOPROL-XL Take 50 mg by mouth daily.   multivitamin with minerals tablet Take 1 tablet by mouth daily.   omeprazole 20 MG capsule Commonly known as: PRILOSEC Take  20 mg by mouth daily.   polyethylene glycol 17 g packet Commonly known as: MIRALAX / GLYCOLAX Take 17 g by mouth daily. Mix one tablespoon with 8oz of your favorite juice or water every day until you are having soft formed stools. Then start taking once daily if you didn't have a stool the day before.   simvastatin 40 MG tablet Commonly known as: ZOCOR Take 40 mg by mouth daily.   sulfamethoxazole-trimethoprim 400-80 MG tablet Commonly known as: BACTRIM Take 1 tablet by mouth 2 (two) times daily.   torsemide 20 MG tablet Commonly known as: DEMADEX Take 40 mg by mouth daily.   Vitamin B-12 1000 MCG Subl Take 1,000 mcg by mouth daily.   warfarin 5 MG tablet Commonly known as: COUMADIN Take 5 mg by mouth at bedtime.         Signed: Wells Lindsey Demonte 08/31/2021, 1:09 PM

## 2021-09-01 ENCOUNTER — Emergency Department
Admission: EM | Admit: 2021-09-01 | Discharge: 2021-09-01 | Disposition: A | Discharge status: Home / Self Care | Payer: Medicare HMO | Attending: Emergency Medicine | Admitting: Emergency Medicine

## 2021-09-01 ENCOUNTER — Other Ambulatory Visit: Payer: Self-pay

## 2021-09-01 DIAGNOSIS — I4891 Unspecified atrial fibrillation: Secondary | ICD-10-CM | POA: Insufficient documentation

## 2021-09-01 DIAGNOSIS — Z7901 Long term (current) use of anticoagulants: Secondary | ICD-10-CM | POA: Insufficient documentation

## 2021-09-01 DIAGNOSIS — Z79899 Other long term (current) drug therapy: Secondary | ICD-10-CM | POA: Diagnosis not present

## 2021-09-01 DIAGNOSIS — R609 Edema, unspecified: Secondary | ICD-10-CM | POA: Diagnosis not present

## 2021-09-01 DIAGNOSIS — Z7982 Long term (current) use of aspirin: Secondary | ICD-10-CM | POA: Insufficient documentation

## 2021-09-01 DIAGNOSIS — E039 Hypothyroidism, unspecified: Secondary | ICD-10-CM | POA: Insufficient documentation

## 2021-09-01 DIAGNOSIS — I2510 Atherosclerotic heart disease of native coronary artery without angina pectoris: Secondary | ICD-10-CM | POA: Insufficient documentation

## 2021-09-01 DIAGNOSIS — E119 Type 2 diabetes mellitus without complications: Secondary | ICD-10-CM | POA: Diagnosis not present

## 2021-09-01 DIAGNOSIS — Z951 Presence of aortocoronary bypass graft: Secondary | ICD-10-CM | POA: Diagnosis not present

## 2021-09-01 DIAGNOSIS — R58 Hemorrhage, not elsewhere classified: Secondary | ICD-10-CM | POA: Insufficient documentation

## 2021-09-01 DIAGNOSIS — I119 Hypertensive heart disease without heart failure: Secondary | ICD-10-CM | POA: Insufficient documentation

## 2021-09-01 DIAGNOSIS — L7622 Postprocedural hemorrhage and hematoma of skin and subcutaneous tissue following other procedure: Secondary | ICD-10-CM | POA: Diagnosis not present

## 2021-09-01 MED ORDER — ACETAMINOPHEN 500 MG PO TABS
1000.0000 mg | ORAL_TABLET | Freq: Once | ORAL | Status: AC
  Administered 2021-09-01: 1000 mg via ORAL
  Filled 2021-09-01: qty 2

## 2021-09-01 NOTE — ED Triage Notes (Signed)
Pt arrived by EMS from home concerned for bleeding on his legs. Pt states he woke around 9:30 and there was a puddle of blood around his feet.  Pt was recently discharged from hospital for bleeding from veins and wounds.

## 2021-09-01 NOTE — ED Provider Notes (Signed)
[facility] Emergency Department Provider Note ____________________________________________   Event Date/Time   First MD Initiated Contact with Patient 09/01/21 1015     (approximate)  I have reviewed the triage vital signs and the nursing notes.  HISTORY  Chief Complaint vascular problem   HPI [name] [name] is a 87 y.o. malewho presents to the ED for evaluation of bleeding left leg.  Chart review indicates patient is anticoagulated on Coumadin due to atrial fibrillation.  History of CAD and PAD.  Looks like he has had multiple episodes of superficial venous bleeding to his left leg in the past. 2 days ago he just had percutaneous angiography of that left leg with angioplasty and stent placement to his femoral and popliteal arteries.  Accessed via right femoral site.  Patient presents to the ED from home via EMS for evaluation of bleeding.  He reports he was fine last night, but when he awoke this morning there was a small pool of blood by his left leg at the base of the recliner that he sleeps on.  Denies any falls or trauma, syncopal episodes or pain.  He reports that he feels fine now and that the bleeding is stopped.  No other complaints.  He was recently switched to Eliquis from Coumadin.  He was supposed to restart his Eliquis this morning but has not taken it yet.   Past Medical History:  Diagnosis Date   A-fib ([place])    B12 deficiency    Coronary artery disease    HTN (hypertension), benign 04/25/2014   Iron deficiency anemia    Thyroid disease    Type 2 diabetes mellitus with peripheral angiopathy ([place]) 11/10/2018    Patient Active Problem List   Diagnosis Date Noted   Atherosclerosis of artery of extremity with rest pain ([place]) 08/30/2021   Atherosclerosis of native arteries of extremity with intermittent claudication ([place]) 02/18/2021   Cataract cortical, senile 02/11/2021   Cor pulmonale, acute ([place]) 05/02/2020   Type 2 diabetes  mellitus with peripheral angiopathy ([place]) 11/10/2018   Medicare annual wellness visit, initial 05/05/2017   Varicose veins of lower extremities with ulcer ([place]) 04/23/2017   Chronic venous insufficiency 04/23/2017   Leg pain 04/23/2017   Lymphedema 04/23/2017   A-fib ([place]) 04/23/2017   Hyperlipidemia 04/23/2017   Acquired hypothyroidism 11/03/2016   B12 deficiency 10/27/2014   CAD (coronary artery disease) 04/25/2014   GERD (gastroesophageal reflux disease) 04/25/2014   HTN (hypertension), benign 04/25/2014   S/P CABG x 3 02/26/1993    Past Surgical History:  Procedure Laterality Date   CARDIAC SURGERY     CABG 1991   HERNIA REPAIR      Prior to Admission medications   Medication Sig Start Date End Date Taking? Authorizing Provider  acetaminophen (TYLENOL) 500 MG tablet Take 1,000 mg by mouth every 6 (six) hours as needed for moderate pain.    [provider]  apixaban (ELIQUIS) 5 MG TABS tablet Take 5 mg by mouth 2 (two) times daily.    [provider]  aspirin EC 81 MG EC tablet Take 1 tablet (81 mg total) by mouth daily. Swallow whole. 09/01/21   Serafina Mitchell, MD  cetirizine (ZYRTEC) 10 MG tablet Take 10 mg by mouth daily as needed for allergies. Patient not taking: Reported on 08/30/2021    [provider]  Cyanocobalamin (VITAMIN B-12) 1000 MCG SUBL Take 1,000 mcg by mouth daily. 03/23/14   [provider]  ferrous sulfate 325 (  65 FE) MG EC tablet Take 325 mg by mouth daily.    [provider]  levothyroxine (SYNTHROID, LEVOTHROID) 112 MCG tablet Take 112 mcg by mouth daily. 03/28/14   [provider]  metoprolol succinate (TOPROL-XL) 50 MG 24 hr tablet Take 50 mg by mouth daily. 03/28/14   [provider]  Multiple Vitamins-Minerals (MULTIVITAMIN WITH MINERALS) tablet Take 1 tablet by mouth daily. 06/11/16   [provider]  Omega-3 Fatty Acids (FISH OIL) 1000 MG CAPS Take 1,000 mg by mouth in the morning and  at bedtime.    [provider]  omeprazole (PRILOSEC) 20 MG capsule Take 20 mg by mouth daily. 01/06/17 08/30/21  [provider]  polyethylene glycol (MIRALAX / GLYCOLAX) packet Take 17 g by mouth daily. Mix one tablespoon with 8oz of your favorite juice or water every day until you are having soft formed stools. Then start taking once daily if you didn't have a stool the day before. Patient not taking: No sig reported 06/01/18   Merlyn Lot, MD  simvastatin (ZOCOR) 40 MG tablet Take 40 mg by mouth daily. 03/28/14   [provider]  sulfamethoxazole-trimethoprim (BACTRIM) 400-80 MG tablet Take 1 tablet by mouth 2 (two) times daily. Patient not taking: Reported on 08/30/2021 08/21/21   Kris Hartmann, NP  torsemide (DEMADEX) 20 MG tablet Take 40 mg by mouth daily.    [provider]  warfarin (COUMADIN) 5 MG tablet Take 5 mg by mouth at bedtime. Patient not taking: Reported on 08/30/2021 03/28/14   [provider]    Allergies Cephalexin, Spironolactone, Codeine, and Doxycycline  Family History  Problem Relation Age of Onset   Hypertension Mother    Diabetes Brother    Stroke Brother    Heart attack Brother     Social History Social History   Tobacco Use   Smoking status: Never   Smokeless tobacco: Never  Vaping Use   Vaping Use: Never used  Substance Use Topics   Alcohol use: No   Drug use: No    Review of Systems  Constitutional: No fever/chills Eyes: No visual changes. ENT: No sore throat. Cardiovascular: Denies chest pain. Respiratory: Denies shortness of breath. Gastrointestinal: No abdominal pain.  No nausea, no vomiting.  No diarrhea.  No constipation. Genitourinary: Negative for dysuria. Musculoskeletal: Negative for back pain. Skin: Negative for rash. Neurological: Negative for headaches, focal weakness or numbness.  ____________________________________________   PHYSICAL EXAM:  VITAL SIGNS: Vitals:   09/01/21  1250 09/01/21 1351  BP: (!) 107/43 (!) 107/50  Pulse: 77 63  Resp: 16 15  Temp:  98.1 F (36.7 C)  SpO2: 98% 99%     Constitutional: Alert and oriented. Well appearing and in no acute distress. Eyes: Conjunctivae are normal. PERRL. EOMI. Head: Atraumatic. Nose: No congestion/rhinnorhea. Mouth/Throat: Mucous membranes are moist.  Oropharynx non-erythematous. Neck: No stridor. No cervical spine tenderness to palpation. Cardiovascular: Normal rate, regular rhythm. Grossly normal heart sounds.  Good peripheral circulation. Respiratory: Normal respiratory effort.  No retractions. Lungs CTAB. Gastrointestinal: Soft , nondistended, nontender to palpation. No CVA tenderness. Musculoskeletal: No lower extremity tenderness nor edema.  No joint effusions. No signs of acute trauma. Right femoral access site has a dark red clot in it and has no active bleeding.  Has a small amount of dried blood to the medial aspect of his left knee without overlying signs of trauma. Bandaged wounds to his left dorsal foot and left distal shin that are clean, dry  and intact.  No evidence of bleeding from these areas. Neurologic:  Normal speech and language. No gross focal neurologic deficits are appreciated.  Skin:  Skin is warm, dry and intact. No rash noted. Psychiatric: Mood and affect are normal. Speech and behavior are normal.  ____________________________________________   LABS (all labs ordered are listed, but only abnormal results are displayed)  Labs Reviewed - No data to display ____________________________________________  12 Lead EKG   ____________________________________________  RADIOLOGY  ED MD interpretation:    Official radiology report(s): No results found.  ____________________________________________   PROCEDURES and INTERVENTIONS  Procedure(s) performed (including Critical Care):  Procedures  Medications  acetaminophen (TYLENOL) tablet 1,000 mg (1,000 mg Oral Given  09/01/21 1115)    ____________________________________________   MDM / ED COURSE   Anticoagulated 87 year old man with recent leg arteriogram and stent placement presents after waking up with blood by his foot, but no evidence of active bleeding, and amenable to outpatient management.  He presents well-appearing without active bleeding or vital signs derangements.  No access site hematoma is noted.  He is observed for nearly 4 hours without evidence of rebleeding.  Pain is well controlled with Tylenol and no indications for diagnostics at this time.  We provided wound care and education.  We will have him follow-up with vascular surgery as an outpatient.  Return precautions to the ED were discussed.  Clinical Course as of 09/01/21 1514  Sun Sep 01, 2021  1220 Reassessed.  Patient reports pain to his left heel and distal shin.  I remove the dressings at these areas and note hemostatic chronic wounds.  He reports significant relief of his pain after I remove these dressings.  Reports he feels well.  I discussed plan of care with patient and daughter about further observation time, and probably discharge.  They are in agreement. [DS]    Clinical Course User Index [DS] Vladimir Crofts, MD    ____________________________________________   FINAL CLINICAL IMPRESSION(S) / ED DIAGNOSES  Final diagnoses:  Bleeding     ED Discharge Orders     None        Antaniya Venuti Tamala Julian   Note:  This document was prepared using Dragon voice recognition software and may include unintentional dictation errors.    Vladimir Crofts, MD 09/01/21 1515

## 2021-09-01 NOTE — ED Notes (Signed)
Wound care to foot ankle and femoral incision

## 2021-09-03 ENCOUNTER — Telehealth (INDEPENDENT_AMBULATORY_CARE_PROVIDER_SITE_OTHER): Payer: Self-pay | Admitting: Vascular Surgery

## 2021-09-03 ENCOUNTER — Encounter: Payer: Self-pay | Admitting: Vascular Surgery

## 2021-09-03 NOTE — Telephone Encounter (Signed)
He  can drive friday

## 2021-09-03 NOTE — Telephone Encounter (Signed)
Called to ask when will patient be cleared to drive? Patient had le angio done 08/30/21 ([facility])  Please advise.

## 2021-09-03 NOTE — Telephone Encounter (Signed)
Patient daughter was made aware with medical recommendations

## 2021-09-04 DIAGNOSIS — I5022 Chronic systolic (congestive) heart failure: Secondary | ICD-10-CM | POA: Insufficient documentation

## 2021-09-04 DIAGNOSIS — I7025 Atherosclerosis of native arteries of other extremities with ulceration: Secondary | ICD-10-CM | POA: Insufficient documentation

## 2021-09-04 DIAGNOSIS — I482 Chronic atrial fibrillation, unspecified: Secondary | ICD-10-CM | POA: Diagnosis not present

## 2021-09-04 DIAGNOSIS — I5082 Biventricular heart failure: Secondary | ICD-10-CM | POA: Diagnosis not present

## 2021-09-04 DIAGNOSIS — I739 Peripheral vascular disease, unspecified: Secondary | ICD-10-CM | POA: Diagnosis not present

## 2021-09-04 DIAGNOSIS — I83892 Varicose veins of left lower extremities with other complications: Secondary | ICD-10-CM | POA: Diagnosis not present

## 2021-09-04 DIAGNOSIS — I5023 Acute on chronic systolic (congestive) heart failure: Secondary | ICD-10-CM | POA: Insufficient documentation

## 2021-09-11 DIAGNOSIS — I5082 Biventricular heart failure: Secondary | ICD-10-CM | POA: Diagnosis not present

## 2021-09-11 DIAGNOSIS — L03116 Cellulitis of left lower limb: Secondary | ICD-10-CM | POA: Diagnosis not present

## 2021-09-11 NOTE — Progress Notes (Signed)
MRN : [id number]  [name] [name] is a 87 y.o. (1929/04/23) male who presents with chief complaint of check legs.  History of Present Illness:   The patient returns to the office for followup and review status post angiogram with intervention 08/30/2021.   Procedure: 1.  Percutaneous transluminal angioplasty and stent placement left superficial femoral and popliteal arteries to 5 mm using a 7 mm x 150 mm life stent 2.  Percutaneous transluminal angioplasty left tibioperoneal trunk to 3.5 mm with an ultra score balloon  The patient notes improvement in the lower extremity symptoms. No interval shortening of the patient's claudication distance or rest pain symptoms. Previous wounds have now healed.  No new ulcers or wounds have occurred since the last visit.  There have been no significant changes to the patient's overall health care.  The patient denies amaurosis fugax or recent TIA symptoms. There are no recent neurological changes noted. The patient denies history of DVT, PE or superficial thrombophlebitis. The patient denies recent episodes of angina or shortness of breath.    No outpatient medications have been marked as taking for the 09/12/21 encounter (Appointment) with Delana Meyer, Dolores Lory, MD.    Past Medical History:  Diagnosis Date   A-fib [facility])    B12 deficiency    Coronary artery disease    HTN (hypertension), benign 04/25/2014   Iron deficiency anemia    Thyroid disease    Type 2 diabetes mellitus with peripheral angiopathy ([place]) 11/10/2018    Past Surgical History:  Procedure Laterality Date   CARDIAC SURGERY     CABG 1991   HERNIA REPAIR     LOWER EXTREMITY ANGIOGRAPHY Left 08/30/2021   Procedure: LOWER EXTREMITY ANGIOGRAPHY;  Surgeon: Katha Cabal, MD;  Location: [facility];  Service: Cardiovascular;  Laterality: Left;    Social History Social History   Tobacco Use   Smoking status: Never   Smokeless tobacco: Never  Vaping Use   Vaping  Use: Never used  Substance Use Topics   Alcohol use: No   Drug use: No    Family History Family History  Problem Relation Age of Onset   Hypertension Mother    Diabetes Brother    Stroke Brother    Heart attack Brother     Allergies  Allergen Reactions   Cephalexin Rash   Spironolactone Nausea Only   Codeine Rash and Other (See Comments)    Can not sleep   Doxycycline Rash     REVIEW OF SYSTEMS (Negative unless checked)  Constitutional: '[]'$ Weight loss  '[]'$ Fever  '[]'$ Chills Cardiac: '[]'$ Chest pain   '[]'$ Chest pressure   '[]'$ Palpitations   '[]'$ Shortness of breath when laying flat   '[]'$ Shortness of breath with exertion. Vascular:  '[]'$ Pain in legs with walking   '[x]'$ Pain in legs at rest  '[]'$ History of DVT   '[]'$ Phlebitis   '[x]'$ Swelling in legs   '[]'$ Varicose veins   '[x]'$ Non-healing ulcers Pulmonary:   '[]'$ Uses home oxygen   '[]'$ Productive cough   '[]'$ Hemoptysis   '[]'$ Wheeze  '[x]'$ COPD   '[]'$ Asthma Neurologic:  '[]'$ Dizziness   '[]'$ Seizures   '[]'$ History of stroke   '[]'$ History of TIA  '[]'$ Aphasia   '[]'$ Vissual changes   '[]'$ Weakness or numbness in arm   '[]'$ Weakness or numbness in leg Musculoskeletal:   '[]'$ Joint swelling   '[]'$ Joint pain   '[]'$ Low back pain Hematologic:  '[]'$ Easy bruising  '[]'$ Easy bleeding   '[]'$ Hypercoagulable state   '[]'$ Anemic Gastrointestinal:  '[]'$ Diarrhea   '[]'$ Vomiting  '[x]'$ Gastroesophageal reflux/heartburn   '[]'$ Difficulty swallowing.  Genitourinary:  '[]'$ Chronic kidney disease   '[]'$ Difficult urination  '[]'$ Frequent urination   '[]'$ Blood in urine Skin:  '[x]'$ Rashes   '[x]'$ Ulcers  Psychological:  '[]'$ History of anxiety   '[]'$  History of major depression.  Physical Examination  There were no vitals filed for this visit. There is no height or weight on file to calculate BMI. Gen: WD/WN, NAD Head: [place]/AT, No temporalis wasting.  Ear/Nose/Throat: Hearing grossly intact, nares w/o erythema or drainage Eyes: PER, EOMI, sclera nonicteric.  Neck: Supple, no masses.  No bruit or JVD.  Pulmonary:  Good air movement, no audible wheezing, no use  of accessory muscles.  Cardiac: RRR, normal S1, S2, no Murmurs. Vascular:   right leg with 2-3+ edema with mild erythema ulcer appears noninfected Vessel Right Left  Radial Palpable Palpable  PT Not Palpable Not Palpable  DP Not Palpable Not Palpable  Gastrointestinal: soft, non-distended. No guarding/no peritoneal signs.  Musculoskeletal: M/S 5/5 throughout.  No visible deformity.  Neurologic: CN 2-12 intact. Pain and light touch intact in extremities.  Symmetrical.  Speech is fluent. Motor exam as listed above. Psychiatric: Judgment intact, Mood & affect appropriate for pt's clinical situation. Dermatologic: + rashes + ulcers noted.  No changes consistent with cellulitis.   CBC Lab Results  Component Value Date   WBC 11.3 (H) 08/25/2021   HGB 9.3 (L) 08/25/2021   HCT 26.6 (L) 08/25/2021   MCV 105.6 (H) 08/25/2021   PLT 209 08/25/2021    BMET    Component Value Date/Time   NA 136 08/25/2021 1825   NA 141 02/04/2015 1001   K 3.8 08/25/2021 1825   K 4.2 02/04/2015 1001   CL 100 08/25/2021 1825   CL 106 02/04/2015 1001   CO2 28 08/25/2021 1825   CO2 30 02/04/2015 1001   GLUCOSE 125 (H) 08/25/2021 1825   GLUCOSE 122 (H) 02/04/2015 1001   BUN 24 (H) 08/27/2021 1240   BUN 19 (H) 02/04/2015 1001   CREATININE 1.60 (H) 08/27/2021 1240   CREATININE 1.26 02/04/2015 1001   CALCIUM 8.5 (L) 08/25/2021 1825   CALCIUM 8.5 02/04/2015 1001   GFRNONAA 41 (L) 08/27/2021 1240   GFRNONAA 58 (L) 02/04/2015 1001   GFRNONAA >60 11/06/2013 1304   GFRAA >60 02/05/2019 0121   GFRAA >60 02/04/2015 1001   GFRAA >60 11/06/2013 1304   Estimated Creatinine Clearance: 30.3 mL/min (A) (by C-G formula based on SCr of 1.6 mg/dL (H)).  COAG Lab Results  Component Value Date   INR 2.0 (H) 08/25/2021   INR 2.99 02/05/2019   INR 1.4 02/04/2015    Radiology PERIPHERAL VASCULAR CATHETERIZATION  Result Date: 08/30/2021 See surgical note for result.  ABI WITH/WO TBI  Result Date: 08/22/2021   LOWER EXTREMITY DOPPLER STUDY Patient Name:  [name] [name]  Date of Exam:   08/21/2021 Medical Rec #: [id number]         Accession #:    [id number] Date of Birth: Dec 15, 1929        Patient Gender: M Patient Age:   66 years Exam Location:  [facility] Procedure:      VAS [place] ABI WITH/WO TBI Referring Phys: Eulogio Ditch --------------------------------------------------------------------------------  Indications: Peripheral artery disease, and reoccuring annual left medial foot              wound.  Comparison Study: 02/11/2021 Performing Technologist: Charlane Ferretti RT (R)(VS)  Examination Guidelines: A complete evaluation includes at minimum, Doppler waveform signals and systolic blood pressure reading at the level  of bilateral brachial, anterior tibial, and posterior tibial arteries, when vessel segments are accessible. Bilateral testing is considered an integral part of a complete examination. Photoelectric Plethysmograph (PPG) waveforms and toe systolic pressure readings are included as required and additional duplex testing as needed. Limited examinations for reoccurring indications may be performed as noted.  ABI Findings: +---------+------------------+-----+----------+--------+ Right    Rt Pressure (mmHg)IndexWaveform  Comment  +---------+------------------+-----+----------+--------+ Brachial 145                                       +---------+------------------+-----+----------+--------+ ATA                             monophasicNC       +---------+------------------+-----+----------+--------+ PTA                             monophasicNC       +---------+------------------+-----+----------+--------+ Great Toe2                 0.01 Normal             +---------+------------------+-----+----------+--------+ +---------+------------------+-----+----------+-------+ Left     Lt Pressure (mmHg)IndexWaveform  Comment  +---------+------------------+-----+----------+-------+ Brachial 134                                      +---------+------------------+-----+----------+-------+ ATA      65                     monophasic        +---------+------------------+-----+----------+-------+ PTA      80                0.55 monophasic        +---------+------------------+-----+----------+-------+ Great Toe18                0.12 Abnormal          +---------+------------------+-----+----------+-------+ +-------+-----------+-----------+------------+------------+ ABI/TBIToday's ABIToday's TBIPrevious ABIPrevious TBI +-------+-----------+-----------+------------+------------+ Right  [place]         1.26       [place]          1.02         +-------+-----------+-----------+------------+------------+ Left   .55        .12        [place]          .69          +-------+-----------+-----------+------------+------------+ Right ABIs appear essentially unchanged compared to prior study on 02/11/2021. Left ABIs appear decreased compared to prior study on 02/11/2021.  Summary: Right: Resting right ankle-brachial index indicates noncompressible right lower extremity arteries. The right toe-brachial index is normal. Left: Resting left ankle-brachial index indicates moderate left lower extremity arterial disease. The left toe-brachial index is abnormal. *See table(s) above for measurements and observations.  Electronically signed by Hortencia Pilar MD on 08/22/2021 at 4:26:21 PM.    Final      Assessment/Plan 1. Atherosclerosis of artery of extremity with rest pain ([place]) Recommend: The patient seems improved but this is a very complex problem.   Patient should undergo arterial duplex of the lower extremity.    The patient will follow up with me in the office to review the studies.    2. Coronary artery disease of native artery of  native heart with stable angina pectoris (HCC) Continue cardiac and antihypertensive  medications as already ordered and reviewed, no changes at this time.  Continue statin as ordered and reviewed, no changes at this time  Nitrates PRN for chest pain   3. Atrial fibrillation, unspecified type ([place]) Continue antiarrhythmia medications as already ordered, these medications have been reviewed and there are no changes at this time.  Continue anticoagulation as ordered by Cardiology Service   4. Chronic venous insufficiency No surgery or intervention at this point in time.    I have had a long discussion with the patient regarding venous insufficiency and why it  causes symptoms. I have discussed with the patient the chronic skin changes that accompany venous insufficiency and the long term sequela such as infection and ulceration.  Patient will begin wearing graduated compression stockings class 1 (20-30 mmHg) or compression wraps on a daily basis a prescription was given. The patient will put the stockings on first thing in the morning and removing them in the evening. The patient is instructed specifically not to sleep in the stockings.    In addition, behavioral modification including several periods of elevation of the lower extremities during the day will be continued. I have demonstrated that proper elevation is a position with the ankles at heart level.  The patient is instructed to begin routine exercise, especially walking on a daily basis   5. Type 2 diabetes mellitus with peripheral angiopathy (HCC) Continue hypoglycemic medications as already ordered, these medications have been reviewed and there are no changes at this time.  Hgb A1C to be monitored as already arranged by primary service   6. Gastroesophageal reflux disease without esophagitis Continue PPI as already ordered, this medication has been reviewed and there are no changes at this time.  Avoidence of caffeine and alcohol  Moderate elevation of the head of the bed      Hortencia Pilar,  MD  09/11/2021 9:41 PM

## 2021-09-12 ENCOUNTER — Other Ambulatory Visit: Payer: Self-pay

## 2021-09-12 ENCOUNTER — Ambulatory Visit (INDEPENDENT_AMBULATORY_CARE_PROVIDER_SITE_OTHER): Payer: Medicare HMO | Admitting: Vascular Surgery

## 2021-09-12 ENCOUNTER — Encounter (INDEPENDENT_AMBULATORY_CARE_PROVIDER_SITE_OTHER): Payer: Self-pay | Admitting: Vascular Surgery

## 2021-09-12 VITALS — BP 130/58 | HR 57 | Resp 16 | Wt 168.6 lb

## 2021-09-12 DIAGNOSIS — I25118 Atherosclerotic heart disease of native coronary artery with other forms of angina pectoris: Secondary | ICD-10-CM | POA: Diagnosis not present

## 2021-09-12 DIAGNOSIS — I70229 Atherosclerosis of native arteries of extremities with rest pain, unspecified extremity: Secondary | ICD-10-CM | POA: Diagnosis not present

## 2021-09-12 DIAGNOSIS — E1151 Type 2 diabetes mellitus with diabetic peripheral angiopathy without gangrene: Secondary | ICD-10-CM | POA: Diagnosis not present

## 2021-09-12 DIAGNOSIS — I4891 Unspecified atrial fibrillation: Secondary | ICD-10-CM

## 2021-09-12 DIAGNOSIS — I872 Venous insufficiency (chronic) (peripheral): Secondary | ICD-10-CM | POA: Diagnosis not present

## 2021-09-12 DIAGNOSIS — K219 Gastro-esophageal reflux disease without esophagitis: Secondary | ICD-10-CM

## 2021-09-15 ENCOUNTER — Encounter (INDEPENDENT_AMBULATORY_CARE_PROVIDER_SITE_OTHER): Payer: Self-pay | Admitting: Vascular Surgery

## 2021-09-16 ENCOUNTER — Ambulatory Visit (INDEPENDENT_AMBULATORY_CARE_PROVIDER_SITE_OTHER): Payer: Medicare HMO | Admitting: Vascular Surgery

## 2021-09-16 ENCOUNTER — Other Ambulatory Visit: Payer: Self-pay

## 2021-09-16 ENCOUNTER — Ambulatory Visit: Payer: Medicare HMO | Admitting: Dermatology

## 2021-09-16 DIAGNOSIS — L578 Other skin changes due to chronic exposure to nonionizing radiation: Secondary | ICD-10-CM

## 2021-09-16 DIAGNOSIS — L821 Other seborrheic keratosis: Secondary | ICD-10-CM | POA: Diagnosis not present

## 2021-09-16 DIAGNOSIS — L570 Actinic keratosis: Secondary | ICD-10-CM | POA: Diagnosis not present

## 2021-09-16 DIAGNOSIS — D692 Other nonthrombocytopenic purpura: Secondary | ICD-10-CM | POA: Diagnosis not present

## 2021-09-16 DIAGNOSIS — L820 Inflamed seborrheic keratosis: Secondary | ICD-10-CM | POA: Diagnosis not present

## 2021-09-16 NOTE — Progress Notes (Signed)
Follow-Up Visit   Subjective  [name] [name] is a 87 y.o. male who presents for the following: Follow-up (Patient here today concerning spots at face he would like checked. ).  The following portions of the chart were reviewed this encounter and updated as appropriate:  Tobacco  Allergies  Meds  Problems  Med Hx  Surg Hx  Fam Hx     Review of Systems: No other skin or systemic complaints except as noted in HPI or Assessment and Plan.  Objective  Well appearing patient in no apparent distress; mood and affect are within normal limits.  A focused examination was performed including face and ears. Relevant physical exam findings are noted in the Assessment and Plan.  face and ears 18 (18) Erythematous thin papules/macules with gritty scale.   face and ears x 17 (17) Erythematous keratotic or waxy stuck-on papule or plaque.   Assessment & Plan  Actinic keratosis (18) face and ears 18  Actinic keratoses are precancerous spots that appear secondary to cumulative UV radiation exposure/sun exposure over time. They are chronic with expected duration over 1 year. A portion of actinic keratoses will progress to squamous cell carcinoma of the skin. It is not possible to reliably predict which spots will progress to skin cancer and so treatment is recommended to prevent development of skin cancer.  Recommend daily broad spectrum sunscreen SPF 30+ to sun-exposed areas, reapply every 2 hours as needed.  Recommend staying in the shade or wearing long sleeves, sun glasses (UVA+UVB protection) and wide brim hats (4-inch brim around the entire circumference of the hat). Call for new or changing lesions.  Destruction of lesion - face and ears 18 Complexity: simple   Destruction method: cryotherapy   Informed consent: discussed and consent obtained   Timeout:  patient name, date of birth, surgical site, and procedure verified Lesion destroyed using liquid nitrogen: Yes   Region frozen  until ice ball extended beyond lesion: Yes   Outcome: patient tolerated procedure well with no complications   Post-procedure details: wound care instructions given    Inflamed seborrheic keratosis face and ears x 17  Destruction of lesion - face and ears x 17 Complexity: simple   Destruction method: cryotherapy   Informed consent: discussed and consent obtained   Timeout:  patient name, date of birth, surgical site, and procedure verified Lesion destroyed using liquid nitrogen: Yes   Region frozen until ice ball extended beyond lesion: Yes   Outcome: patient tolerated procedure well with no complications   Post-procedure details: wound care instructions given    Seborrheic Keratoses - Stuck-on, waxy, tan-brown papules and/or plaques  - Benign-appearing - Discussed benign etiology and prognosis. - Observe - Call for any changes  Purpura - Chronic; persistent and recurrent.  Treatable, but not curable. - Violaceous macules and patches - Benign - Related to trauma, age, sun damage and/or use of blood thinners, chronic use of topical and/or oral steroids - Observe - Can use OTC arnica containing moisturizer such as Dermend Bruise Formula if desired - Call for worsening or other concerns  Actinic Damage - chronic, secondary to cumulative UV radiation exposure/sun exposure over time - diffuse scaly erythematous macules with underlying dyspigmentation - Recommend daily broad spectrum sunscreen SPF 30+ to sun-exposed areas, reapply every 2 hours as needed.  - Recommend staying in the shade or wearing long sleeves, sun glasses (UVA+UVB protection) and wide brim hats (4-inch brim around the entire circumference of the hat). - Call for new  or changing lesions.  Return for 2 month follow up on aks and isk . IRuthell Rummage, CMA, am acting as scribe for Sarina Ser, MD. Documentation: I have reviewed the above documentation for accuracy and completeness, and I agree with the  above.  Sarina Ser, MD

## 2021-09-16 NOTE — Patient Instructions (Addendum)
Seborrheic Keratosis  What causes seborrheic keratoses? Seborrheic keratoses are harmless, common skin growths that first appear during adult life.  As time goes by, more growths appear.  Some people may develop a large number of them.  Seborrheic keratoses appear on both covered and uncovered body parts.  They are not caused by sunlight.  The tendency to develop seborrheic keratoses can be inherited.  They vary in color from skin-colored to gray, brown, or even black.  They can be either smooth or have a rough, warty surface.   Seborrheic keratoses are superficial and look as if they were stuck on the skin.  Under the microscope this type of keratosis looks like layers upon layers of skin.  That is why at times the top layer may seem to fall off, but the rest of the growth remains and re-grows.    Treatment Seborrheic keratoses do not need to be treated, but can easily be removed in the office.  Seborrheic keratoses often cause symptoms when they rub on clothing or jewelry.  Lesions can be in the way of shaving.  If they become inflamed, they can cause itching, soreness, or burning.  Removal of a seborrheic keratosis can be accomplished by freezing, burning, or surgery. If any spot bleeds, scabs, or grows rapidly, please return to have it checked, as these can be an indication of a skin cancer.   Actinic keratoses are precancerous spots that appear secondary to cumulative UV radiation exposure/sun exposure over time. They are chronic with expected duration over 1 year. A portion of actinic keratoses will progress to squamous cell carcinoma of the skin. It is not possible to reliably predict which spots will progress to skin cancer and so treatment is recommended to prevent development of skin cancer.  Recommend daily broad spectrum sunscreen SPF 30+ to sun-exposed areas, reapply every 2 hours as needed.  Recommend staying in the shade or wearing long sleeves, sun glasses (UVA+UVB protection) and wide  brim hats (4-inch brim around the entire circumference of the hat). Call for new or changing lesions. If you have any questions or concerns for your doctor, please call our main line at [phone] and press option 4 to reach your doctor's medical assistant. If no one answers, please leave a voicemail as directed and we will return your call as soon as possible. Messages left after 4 pm will be answered the following business day.   Cryotherapy Aftercare  Wash gently with soap and water everyday.   Apply Vaseline and Band-Aid daily until healed.     You may also send [place] a message via [place]. We typically respond to MyChart messages within 1-2 business days.  For prescription refills, please ask your pharmacy to contact our office. Our fax number is [phone].  If you have an urgent issue when the clinic is closed that cannot wait until the next business day, you can page your doctor at the number below.    Please note that while we do our best to be available for urgent issues outside of office hours, we are not available 24/7.   If you have an urgent issue and are unable to reach [place], you may choose to seek medical care at your doctor's office, retail clinic, urgent care center, or emergency room.  If you have a medical emergency, please immediately call 911 or go to the emergency department.  Pager Numbers  - Dr. Nehemiah Massed: [phone]  - Dr. Laurence Ferrari: [phone]  - Dr. Nicole Kindred: [phone]  In the  event of inclement weather, please call our main line at [phone] for an update on the status of any delays or closures.  Dermatology Medication Tips: Please keep the boxes that topical medications come in in order to help keep track of the instructions about where and how to use these. Pharmacies typically print the medication instructions only on the boxes and not directly on the medication tubes.   If your medication is too expensive, please contact our office at [phone]  option 4 or send [place] a message through [place].   We are unable to tell what your co-pay for medications will be in advance as this is different depending on your insurance coverage. However, we may be able to find a substitute medication at lower cost or fill out paperwork to get insurance to cover a needed medication.   If a prior authorization is required to get your medication covered by your insurance company, please allow [place] 1-2 business days to complete this process.  Drug prices often vary depending on where the prescription is filled and some pharmacies may offer cheaper prices.  The website www.goodrx.com contains coupons for medications through different pharmacies. The prices here do not account for what the cost may be with help from insurance (it may be cheaper with your insurance), but the website can give you the price if you did not use any insurance.  - You can print the associated coupon and take it with your prescription to the pharmacy.  - You may also stop by our office during regular business hours and pick up a GoodRx coupon card.  - If you need your prescription sent electronically to a different pharmacy, notify our office through [facility] or by phone at [phone] option 4.

## 2021-09-17 ENCOUNTER — Encounter: Payer: Self-pay | Admitting: Dermatology

## 2021-09-19 ENCOUNTER — Ambulatory Visit (INDEPENDENT_AMBULATORY_CARE_PROVIDER_SITE_OTHER): Payer: Medicare HMO | Admitting: Vascular Surgery

## 2021-09-19 ENCOUNTER — Ambulatory Visit (INDEPENDENT_AMBULATORY_CARE_PROVIDER_SITE_OTHER): Payer: Medicare HMO

## 2021-09-19 ENCOUNTER — Other Ambulatory Visit: Payer: Self-pay

## 2021-09-19 ENCOUNTER — Encounter (INDEPENDENT_AMBULATORY_CARE_PROVIDER_SITE_OTHER): Payer: Self-pay | Admitting: Vascular Surgery

## 2021-09-19 VITALS — BP 93/47 | HR 71 | Ht 68.0 in | Wt 165.0 lb

## 2021-09-19 DIAGNOSIS — I83023 Varicose veins of left lower extremity with ulcer of ankle: Secondary | ICD-10-CM

## 2021-09-19 DIAGNOSIS — L97322 Non-pressure chronic ulcer of left ankle with fat layer exposed: Secondary | ICD-10-CM

## 2021-09-19 DIAGNOSIS — I4891 Unspecified atrial fibrillation: Secondary | ICD-10-CM

## 2021-09-19 DIAGNOSIS — I70229 Atherosclerosis of native arteries of extremities with rest pain, unspecified extremity: Secondary | ICD-10-CM

## 2021-09-19 DIAGNOSIS — I10 Essential (primary) hypertension: Secondary | ICD-10-CM | POA: Diagnosis not present

## 2021-09-19 DIAGNOSIS — E1151 Type 2 diabetes mellitus with diabetic peripheral angiopathy without gangrene: Secondary | ICD-10-CM

## 2021-09-19 NOTE — Progress Notes (Signed)
MRN : [id number]  [name] [name] is a 87 y.o. (02-Oct-1929) male who presents with chief complaint of Leg check.  History of Present Illness:   The patient returns to the office for followup and review status post angiogram with intervention 08/30/2021.    Procedure: 1.  Percutaneous transluminal angioplasty and stent placement left superficial femoral and popliteal arteries to 5 mm using a 7 mm x 150 mm life stent 2.  Percutaneous transluminal angioplasty left tibioperoneal trunk to 3.5 mm with an ultra score balloon   The patient notes improvement in the lower extremity symptoms. No interval shortening of the patient's claudication distance or rest pain symptoms. Previous wounds have now healed.  No new ulcers or wounds have occurred since the last visit.   There have been no significant changes to the patient's overall health care.   The patient denies amaurosis fugax or recent TIA symptoms. There are no recent neurological changes noted. The patient denies history of DVT, PE or superficial thrombophlebitis. The patient denies recent episodes of angina or shortness of breath.   ABI's Rt=[place] (TBI=0.87) and Lt=[place] (TBI=0.58) (previous ABI's Rt= (TBI=0.87) and Lt=0.55 (TBI=0.12)).  Duplex ultrasound of the left lower extremity arterial shows patent stent in SFA and POP.  Current Meds  Medication Sig   acetaminophen (TYLENOL) 500 MG tablet Take 1,000 mg by mouth every 6 (six) hours as needed for moderate pain.   apixaban (ELIQUIS) 5 MG TABS tablet Take 5 mg by mouth 2 (two) times daily.   aspirin EC 81 MG EC tablet Take 1 tablet (81 mg total) by mouth daily. Swallow whole.   carvedilol (COREG) 3.125 MG tablet Take by mouth.   cetirizine (ZYRTEC) 10 MG tablet Take 10 mg by mouth daily as needed for allergies.   Cyanocobalamin (VITAMIN B-12) 1000 MCG SUBL Take 1,000 mcg by mouth daily.   empagliflozin (JARDIANCE) 10 MG TABS tablet Take 1 tablet by mouth daily.   ferrous sulfate 325  (65 FE) MG EC tablet Take 325 mg by mouth daily.   levothyroxine (SYNTHROID, LEVOTHROID) 112 MCG tablet Take 112 mcg by mouth daily.   metoprolol succinate (TOPROL-XL) 50 MG 24 hr tablet Take 50 mg by mouth daily.   Multiple Vitamins-Minerals (MULTIVITAMIN WITH MINERALS) tablet Take 1 tablet by mouth daily.   Omega-3 Fatty Acids (FISH OIL) 1000 MG CAPS Take 1,000 mg by mouth in the morning and at bedtime.   polyethylene glycol (MIRALAX / GLYCOLAX) packet Take 17 g by mouth daily. Mix one tablespoon with 8oz of your favorite juice or water every day until you are having soft formed stools. Then start taking once daily if you didn't have a stool the day before.   simvastatin (ZOCOR) 40 MG tablet Take 40 mg by mouth daily.   sulfamethoxazole-trimethoprim (BACTRIM) 400-80 MG tablet Take 1 tablet by mouth 2 (two) times daily.   torsemide (DEMADEX) 20 MG tablet Take 40 mg by mouth daily.   warfarin (COUMADIN) 5 MG tablet Take 5 mg by mouth at bedtime.    Past Medical History:  Diagnosis Date   A-fib ([place])    B12 deficiency    Coronary artery disease    HTN (hypertension), benign 04/25/2014   Iron deficiency anemia    Thyroid disease    Type 2 diabetes mellitus with peripheral angiopathy ([place]) 11/10/2018    Past Surgical History:  Procedure Laterality Date   CARDIAC SURGERY     CABG 1991   HERNIA REPAIR  LOWER EXTREMITY ANGIOGRAPHY Left 08/30/2021   Procedure: LOWER EXTREMITY ANGIOGRAPHY;  Surgeon: Katha Cabal, MD;  Location: [facility];  Service: Cardiovascular;  Laterality: Left;    Social History Social History   Tobacco Use   Smoking status: Never   Smokeless tobacco: Never  Vaping Use   Vaping Use: Never used  Substance Use Topics   Alcohol use: No   Drug use: No    Family History Family History  Problem Relation Age of Onset   Hypertension Mother    Diabetes Brother    Stroke Brother    Heart attack Brother     Allergies  Allergen Reactions    Cephalexin Rash   Spironolactone Nausea Only   Codeine Rash and Other (See Comments)    Can not sleep   Doxycycline Rash     REVIEW OF SYSTEMS (Negative unless checked)  Constitutional: [] Weight loss  [] Fever  [] Chills Cardiac: [] Chest pain   [] Chest pressure   [] Palpitations   [] Shortness of breath when laying flat   [] Shortness of breath with exertion. Vascular:  [] Pain in legs with walking   [] Pain in legs at rest  [] History of DVT   [] Phlebitis   [] Swelling in legs   [] Varicose veins   [] Non-healing ulcers Pulmonary:   [] Uses home oxygen   [] Productive cough   [] Hemoptysis   [] Wheeze  [] COPD   [] Asthma Neurologic:  [] Dizziness   [] Seizures   [] History of stroke   [] History of TIA  [] Aphasia   [] Vissual changes   [] Weakness or numbness in arm   [] Weakness or numbness in leg Musculoskeletal:   [] Joint swelling   [] Joint pain   [] Low back pain Hematologic:  [] Easy bruising  [] Easy bleeding   [] Hypercoagulable state   [] Anemic Gastrointestinal:  [] Diarrhea   [] Vomiting  [] Gastroesophageal reflux/heartburn   [] Difficulty swallowing. Genitourinary:  [] Chronic kidney disease   [] Difficult urination  [] Frequent urination   [] Blood in urine Skin:  [] Rashes   [] Ulcers  Psychological:  [] History of anxiety   []  History of major depression.  Physical Examination  Vitals:   09/19/21 1451  BP: (!) 93/47  Pulse: 71  Weight: 165 lb (74.8 kg)  Height: 5\' 8"  (1.727 m)   Body mass index is 25.09 kg/m. Gen: WD/WN, NAD Head: [place]/AT, No temporalis wasting.  Ear/Nose/Throat: Hearing grossly intact, nares w/o erythema or drainage Eyes: PER, EOMI, sclera nonicteric.  Neck: Supple, no masses.  No bruit or JVD.  Pulmonary:  Good air movement, no audible wheezing, no use of accessory muscles.  Cardiac: RRR, normal S1, S2, no Murmurs. Vascular:  2-3+ edema of the left leg with severe venous changes of the left leg.  Venous ulcer noted in the ankle area on the left, noninfected. Vessel Right Left   Radial Palpable Palpable  PT Not Palpable Not Palpable  DP Not Palpable Not Palpable  Gastrointestinal: soft, non-distended. No guarding/no peritoneal signs.  Musculoskeletal: M/S 5/5 throughout.  No visible deformity.  Neurologic: CN 2-12 intact. Pain and light touch intact in extremities.  Symmetrical.  Speech is fluent. Motor exam as listed above. Psychiatric: Judgment intact, Mood & affect appropriate for pt's clinical situation. Dermatologic: Venous stasis dermatitis with ulcers present on the left .  No changes consistent with cellulitis.   CBC Lab Results  Component Value Date   WBC 11.3 (H) 08/25/2021   HGB 9.3 (L) 08/25/2021   HCT 26.6 (L) 08/25/2021   MCV 105.6 (H) 08/25/2021   PLT 209 08/25/2021  BMET    Component Value Date/Time   NA 136 08/25/2021 1825   NA 141 02/04/2015 1001   K 3.8 08/25/2021 1825   K 4.2 02/04/2015 1001   CL 100 08/25/2021 1825   CL 106 02/04/2015 1001   CO2 28 08/25/2021 1825   CO2 30 02/04/2015 1001   GLUCOSE 125 (H) 08/25/2021 1825   GLUCOSE 122 (H) 02/04/2015 1001   BUN 24 (H) 08/27/2021 1240   BUN 19 (H) 02/04/2015 1001   CREATININE 1.60 (H) 08/27/2021 1240   CREATININE 1.26 02/04/2015 1001   CALCIUM 8.5 (L) 08/25/2021 1825   CALCIUM 8.5 02/04/2015 1001   GFRNONAA 41 (L) 08/27/2021 1240   GFRNONAA 58 (L) 02/04/2015 1001   GFRNONAA >60 11/06/2013 1304   GFRAA >60 02/05/2019 0121   GFRAA >60 02/04/2015 1001   GFRAA >60 11/06/2013 1304   CrCl cannot be calculated (Patient's most recent lab result is older than the maximum 21 days allowed.).  COAG Lab Results  Component Value Date   INR 2.0 (H) 08/25/2021   INR 2.99 02/05/2019   INR 1.4 02/04/2015    Radiology PERIPHERAL VASCULAR CATHETERIZATION  Result Date: 08/30/2021 See surgical note for result.  ABI WITH/WO TBI  Result Date: 08/22/2021  LOWER EXTREMITY DOPPLER STUDY Patient Name:  TIMITHY ARONS  Date of Exam:   08/21/2021 Medical Rec #: [id number]          Accession #:    [id number] Date of Birth: October 30, 1929        Patient Gender: M Patient Age:   40 years Exam Location:  [facility] Procedure:      VAS [place] ABI WITH/WO TBI Referring Phys: Eulogio Ditch --------------------------------------------------------------------------------  Indications: Peripheral artery disease, and reoccuring annual left medial foot              wound.  Comparison Study: 02/11/2021 Performing Technologist: Charlane Ferretti RT (R)(VS)  Examination Guidelines: A complete evaluation includes at minimum, Doppler waveform signals and systolic blood pressure reading at the level of bilateral brachial, anterior tibial, and posterior tibial arteries, when vessel segments are accessible. Bilateral testing is considered an integral part of a complete examination. Photoelectric Plethysmograph (PPG) waveforms and toe systolic pressure readings are included as required and additional duplex testing as needed. Limited examinations for reoccurring indications may be performed as noted.  ABI Findings: +---------+------------------+-----+----------+--------+ Right    Rt Pressure (mmHg)IndexWaveform  Comment  +---------+------------------+-----+----------+--------+ Brachial 145                                       +---------+------------------+-----+----------+--------+ ATA                             monophasicNC       +---------+------------------+-----+----------+--------+ PTA                             monophasicNC       +---------+------------------+-----+----------+--------+ Great Toe2                 0.01 Normal             +---------+------------------+-----+----------+--------+ +---------+------------------+-----+----------+-------+ Left     Lt Pressure (mmHg)IndexWaveform  Comment +---------+------------------+-----+----------+-------+ Brachial 134                                      +---------+------------------+-----+----------+-------+  ATA       65                     monophasic        +---------+------------------+-----+----------+-------+ PTA      80                0.55 monophasic        +---------+------------------+-----+----------+-------+ Great Toe18                0.12 Abnormal          +---------+------------------+-----+----------+-------+ +-------+-----------+-----------+------------+------------+ ABI/TBIToday's ABIToday's TBIPrevious ABIPrevious TBI +-------+-----------+-----------+------------+------------+ Right  [place]         1.26       [place]          1.02         +-------+-----------+-----------+------------+------------+ Left   .55        .12        [place]          .69          +-------+-----------+-----------+------------+------------+ Right ABIs appear essentially unchanged compared to prior study on 02/11/2021. Left ABIs appear decreased compared to prior study on 02/11/2021.  Summary: Right: Resting right ankle-brachial index indicates noncompressible right lower extremity arteries. The right toe-brachial index is normal. Left: Resting left ankle-brachial index indicates moderate left lower extremity arterial disease. The left toe-brachial index is abnormal. *See table(s) above for measurements and observations.  Electronically signed by Hortencia Pilar MD on 08/22/2021 at 4:26:21 PM.    Final      Assessment/Plan 1. Atherosclerosis of artery of extremity with rest pain ([place]) Recommend:  The patient is status post successful angiogram with intervention.  The patient reports that the claudication symptoms and leg pain is essentially gone.   The patient denies lifestyle limiting changes at this point in time.  No further invasive studies, angiography or surgery at this time The patient should continue walking and begin a more formal exercise program.  The patient should continue antiplatelet therapy and aggressive treatment of the lipid abnormalities  Smoking cessation was again discussed  The  patient should continue wearing graduated compression socks 10-15 mmHg strength to control the mild edema of the right leg Unna boot will be placed on the left.  Patient should undergo noninvasive studies as ordered. The patient will follow up with me after the studies.   - VAS [place] LOWER EXTREMITY ARTERIAL DUPLEX; Future - VAS [place] ABI WITH/WO TBI; Future  2. Varicose veins of left lower extremity with ulcer of ankle with fat layer exposed ([place]) No surgery or intervention at this point in time.    I have had a long discussion with the patient regarding venous insufficiency and why it  causes symptoms, specifically venous ulceration . I have discussed with the patient the chronic skin changes that accompany venous insufficiency and the long term sequela such as infection and recurring  ulceration.  Patient will be placed in [employer] which will be changed weekly drainage permitting.  In addition, behavioral modification including several periods of elevation of the lower extremities during the day will be continued. Achieving a position with the ankles at heart level was stressed to the patient  The patient is instructed to begin routine exercise, especially walking on a daily basis  Patient should undergo duplex ultrasound of the venous system to ensure that DVT or reflux is not present.  Following the review of the ultrasound the patient will follow up in one  week to reassess the degree of swelling and the control that Unna therapy is offering.   The patient can be assessed for graduated compression stockings or wraps as well as a Lymph Pump once the ulcers are healed.   3. Type 2 diabetes mellitus with peripheral angiopathy (H[facility]) Continue hypoglycemic medications as already ordered, these medications have been reviewed and there are no changes at this time.  Hgb A1C to be monitored as already arranged by primary service   4. HTN (hypertension), benign Continue antihypertensive medications  as already ordered, these medications have been reviewed and there are no changes at this time.   5. Atrial fibrillation, unspecified type ([place]) Continue antiarrhythmia medications as already ordered, these medications have been reviewed and there are no changes at this time.  Continue anticoagulation as ordered by Cardiology Service     Hortencia Pilar, MD  09/19/2021 3:04 PM

## 2021-09-20 ENCOUNTER — Telehealth (INDEPENDENT_AMBULATORY_CARE_PROVIDER_SITE_OTHER): Payer: Self-pay | Admitting: Vascular Surgery

## 2021-09-20 ENCOUNTER — Ambulatory Visit (INDEPENDENT_AMBULATORY_CARE_PROVIDER_SITE_OTHER): Payer: Medicare HMO | Admitting: Nurse Practitioner

## 2021-09-20 DIAGNOSIS — I890 Lymphedema, not elsewhere classified: Secondary | ICD-10-CM

## 2021-09-20 NOTE — Telephone Encounter (Signed)
Bring the patient in unna wrap

## 2021-09-20 NOTE — Progress Notes (Signed)
History of Present Illness  There is no documented history at this time  Assessments & Plan   There are no diagnoses linked to this encounter.    Additional instructions  Subjective:  Patient presents with venous ulcer of the Left lower extremity.    Procedure:  3 layer unna wrap was placed Left lower extremity.   Plan:   Follow up in one week. Added 2 ABD,and Kerlex due to leaking

## 2021-09-22 ENCOUNTER — Encounter (INDEPENDENT_AMBULATORY_CARE_PROVIDER_SITE_OTHER): Payer: Self-pay | Admitting: Vascular Surgery

## 2021-09-23 ENCOUNTER — Encounter (INDEPENDENT_AMBULATORY_CARE_PROVIDER_SITE_OTHER): Payer: Self-pay | Admitting: Nurse Practitioner

## 2021-09-25 ENCOUNTER — Encounter (INDEPENDENT_AMBULATORY_CARE_PROVIDER_SITE_OTHER): Payer: Medicare HMO

## 2021-09-25 ENCOUNTER — Encounter (INDEPENDENT_AMBULATORY_CARE_PROVIDER_SITE_OTHER): Payer: Self-pay

## 2021-09-25 ENCOUNTER — Other Ambulatory Visit: Payer: Self-pay

## 2021-09-25 DIAGNOSIS — I2510 Atherosclerotic heart disease of native coronary artery without angina pectoris: Secondary | ICD-10-CM | POA: Diagnosis not present

## 2021-09-25 DIAGNOSIS — L97822 Non-pressure chronic ulcer of other part of left lower leg with fat layer exposed: Secondary | ICD-10-CM | POA: Diagnosis not present

## 2021-09-25 DIAGNOSIS — I83028 Varicose veins of left lower extremity with ulcer other part of lower leg: Secondary | ICD-10-CM | POA: Diagnosis not present

## 2021-09-25 DIAGNOSIS — I70222 Atherosclerosis of native arteries of extremities with rest pain, left leg: Secondary | ICD-10-CM | POA: Diagnosis not present

## 2021-09-25 DIAGNOSIS — I70212 Atherosclerosis of native arteries of extremities with intermittent claudication, left leg: Secondary | ICD-10-CM | POA: Diagnosis not present

## 2021-09-25 DIAGNOSIS — I110 Hypertensive heart disease with heart failure: Secondary | ICD-10-CM | POA: Diagnosis not present

## 2021-09-25 DIAGNOSIS — I4891 Unspecified atrial fibrillation: Secondary | ICD-10-CM | POA: Diagnosis not present

## 2021-09-25 DIAGNOSIS — I872 Venous insufficiency (chronic) (peripheral): Secondary | ICD-10-CM | POA: Diagnosis not present

## 2021-09-25 DIAGNOSIS — I5023 Acute on chronic systolic (congestive) heart failure: Secondary | ICD-10-CM | POA: Diagnosis not present

## 2021-09-26 ENCOUNTER — Ambulatory Visit (INDEPENDENT_AMBULATORY_CARE_PROVIDER_SITE_OTHER): Payer: Medicare HMO | Admitting: Nurse Practitioner

## 2021-09-26 ENCOUNTER — Telehealth (INDEPENDENT_AMBULATORY_CARE_PROVIDER_SITE_OTHER): Payer: Self-pay

## 2021-09-26 ENCOUNTER — Encounter (INDEPENDENT_AMBULATORY_CARE_PROVIDER_SITE_OTHER): Payer: Self-pay | Admitting: Nurse Practitioner

## 2021-09-26 ENCOUNTER — Encounter (INDEPENDENT_AMBULATORY_CARE_PROVIDER_SITE_OTHER): Payer: Medicare HMO

## 2021-09-26 VITALS — BP 109/55 | HR 76 | Resp 16

## 2021-09-26 DIAGNOSIS — I70222 Atherosclerosis of native arteries of extremities with rest pain, left leg: Secondary | ICD-10-CM | POA: Diagnosis not present

## 2021-09-26 DIAGNOSIS — I70229 Atherosclerosis of native arteries of extremities with rest pain, unspecified extremity: Secondary | ICD-10-CM | POA: Diagnosis not present

## 2021-09-26 NOTE — Telephone Encounter (Signed)
I called [facility] care and made them aware of the new order today for the pt to be wrapped 2x a week per Dr. Delana Meyer, I was told the nurse will be made aware.

## 2021-09-26 NOTE — Progress Notes (Signed)
History of Present Illness  There is no documented history at this time  Assessments & Plan   There are no diagnoses linked to this encounter.    Additional instructions  Subjective:  Patient presents with venous ulcer of the Left lower extremity.    Procedure:  3 layer unna wrap was placed Left lower extremity.   Plan:   Follow up in one week.  

## 2021-09-29 ENCOUNTER — Encounter (INDEPENDENT_AMBULATORY_CARE_PROVIDER_SITE_OTHER): Payer: Self-pay | Admitting: Nurse Practitioner

## 2021-10-01 DIAGNOSIS — I70212 Atherosclerosis of native arteries of extremities with intermittent claudication, left leg: Secondary | ICD-10-CM | POA: Diagnosis not present

## 2021-10-01 DIAGNOSIS — I5023 Acute on chronic systolic (congestive) heart failure: Secondary | ICD-10-CM | POA: Diagnosis not present

## 2021-10-01 DIAGNOSIS — I2510 Atherosclerotic heart disease of native coronary artery without angina pectoris: Secondary | ICD-10-CM | POA: Diagnosis not present

## 2021-10-01 DIAGNOSIS — I4891 Unspecified atrial fibrillation: Secondary | ICD-10-CM | POA: Diagnosis not present

## 2021-10-01 DIAGNOSIS — I83028 Varicose veins of left lower extremity with ulcer other part of lower leg: Secondary | ICD-10-CM | POA: Diagnosis not present

## 2021-10-01 DIAGNOSIS — I872 Venous insufficiency (chronic) (peripheral): Secondary | ICD-10-CM | POA: Diagnosis not present

## 2021-10-01 DIAGNOSIS — L97822 Non-pressure chronic ulcer of other part of left lower leg with fat layer exposed: Secondary | ICD-10-CM | POA: Diagnosis not present

## 2021-10-01 DIAGNOSIS — I110 Hypertensive heart disease with heart failure: Secondary | ICD-10-CM | POA: Diagnosis not present

## 2021-10-01 DIAGNOSIS — I70222 Atherosclerosis of native arteries of extremities with rest pain, left leg: Secondary | ICD-10-CM | POA: Diagnosis not present

## 2021-10-02 ENCOUNTER — Encounter (INDEPENDENT_AMBULATORY_CARE_PROVIDER_SITE_OTHER): Payer: Medicare HMO

## 2021-10-02 NOTE — Progress Notes (Signed)
MRN : [id number]  [name] [name] is a 87 y.o. (01-27-1929) male who presents with chief complaint of follow up leg check.  History of Present Illness:   The patient returns to the office for followup and review status post angiogram with intervention 08/30/2021.    Procedure: 1.  Percutaneous transluminal angioplasty and stent placement left superficial femoral and popliteal arteries to 5 mm using a 7 mm x 150 mm life stent 2.  Percutaneous transluminal angioplasty left tibioperoneal trunk to 3.5 mm with an ultra score balloon   The patient notes improvement in the lower extremity pain and discomfort.  No interval shortening of the patient's claudication distance or rest pain symptoms. Previous wounds are improved.  No new ulcers or wounds have occurred since the last visit.   There have been no significant changes to the patient's overall health care.   The patient denies amaurosis fugax or recent TIA symptoms. There are no recent neurological changes noted. The patient denies history of DVT, PE or superficial thrombophlebitis. The patient denies recent episodes of angina or shortness of breath.    ABI's Rt=[place] (TBI=0.87) and Lt=[place] (TBI=0.58) (previous ABI's Rt=[place] (TBI=0.87) and Lt=0.55 (TBI=0.12)).   Duplex ultrasound of the left lower extremity arterial shows patent stent in SFA and POP.    No outpatient medications have been marked as taking for the 10/03/21 encounter (Appointment) with Delana Meyer, Dolores Lory, MD.    Past Medical History:  Diagnosis Date   A-fib [facility])    B12 deficiency    Coronary artery disease    HTN (hypertension), benign 04/25/2014   Iron deficiency anemia    Thyroid disease    Type 2 diabetes mellitus with peripheral angiopathy ([place]) 11/10/2018    Past Surgical History:  Procedure Laterality Date   CARDIAC SURGERY     CABG 1991   HERNIA REPAIR     LOWER EXTREMITY ANGIOGRAPHY Left 08/30/2021   Procedure: LOWER EXTREMITY ANGIOGRAPHY;  Surgeon: Katha Cabal, MD;  Location: [place] [facility];  Service: Cardiovascular;  Laterality: Left;    Social History Social History   Tobacco Use   Smoking status: Never   Smokeless tobacco: Never  Vaping Use   Vaping Use: Never used  Substance Use Topics   Alcohol use: No   Drug use: No    Family History Family History  Problem Relation Age of Onset   Hypertension Mother    Diabetes Brother    Stroke Brother    Heart attack Brother     Allergies  Allergen Reactions   Cephalexin Rash   Spironolactone Nausea Only   Codeine Rash and Other (See Comments)    Can not sleep   Doxycycline Rash     REVIEW OF SYSTEMS (Negative unless checked)  Constitutional: [] Weight loss  [] Fever  [] Chills Cardiac: [] Chest pain   [] Chest pressure   [] Palpitations   [] Shortness of breath when laying flat   [] Shortness of breath with exertion. Vascular:  [] Pain in legs with walking   [] Pain in legs at rest  [] History of DVT   [] Phlebitis   [] Swelling in legs   [] Varicose veins   [] Non-healing ulcers Pulmonary:   [] Uses home oxygen   [] Productive cough   [] Hemoptysis   [] Wheeze  [] COPD   [] Asthma Neurologic:  [] Dizziness   [] Seizures   [] History of stroke   [] History of TIA  [] Aphasia   [] Vissual changes   [] Weakness or numbness in arm   [] Weakness or numbness in leg  Musculoskeletal:   [] Joint swelling   [] Joint pain   [] Low back pain Hematologic:  [] Easy bruising  [] Easy bleeding   [] Hypercoagulable state   [] Anemic Gastrointestinal:  [] Diarrhea   [] Vomiting  [] Gastroesophageal reflux/heartburn   [] Difficulty swallowing. Genitourinary:  [] Chronic kidney disease   [] Difficult urination  [] Frequent urination   [] Blood in urine Skin:  [] Rashes   [] Ulcers  Psychological:  [] History of anxiety   []  History of major depression.  Physical Examination  There were no vitals filed for this visit. There is no height or weight on file to calculate BMI. Gen: WD/WN, NAD Head: [place]/AT, No temporalis wasting.   Ear/Nose/Throat: Hearing grossly intact, nares w/o erythema or drainage Eyes: PER, EOMI, sclera nonicteric.  Neck: Supple, no masses.  No bruit or JVD.  Pulmonary:  Good air movement, no audible wheezing, no use of accessory muscles.  Cardiac: RRR, normal S1, S2, no Murmurs. Vascular:  2-3+ edema of the left leg with severe venous changes of the left leg.  Venous ulcer noted in the ankle area on the left, noninfected and improved granulation. Vessel Right Left  Radial Palpable Palpable  PT Not Palpable Not Palpable  DP Not Palpable Not Palpable  Gastrointestinal: soft, non-distended. No guarding/no peritoneal signs.  Musculoskeletal: M/S 5/5 throughout.  No visible deformity.  Neurologic: CN 2-12 intact. Pain and light touch intact in extremities.  Symmetrical.  Speech is fluent. Motor exam as listed above. Psychiatric: Judgment intact, Mood & affect appropriate for pt's clinical situation. Dermatologic: Venous stasis dermatitis with ulcers present on the left .  No changes consistent with cellulitis.   CBC Lab Results  Component Value Date   WBC 11.3 (H) 08/25/2021   HGB 9.3 (L) 08/25/2021   HCT 26.6 (L) 08/25/2021   MCV 105.6 (H) 08/25/2021   PLT 209 08/25/2021    BMET    Component Value Date/Time   NA 136 08/25/2021 1825   NA 141 02/04/2015 1001   K 3.8 08/25/2021 1825   K 4.2 02/04/2015 1001   CL 100 08/25/2021 1825   CL 106 02/04/2015 1001   CO2 28 08/25/2021 1825   CO2 30 02/04/2015 1001   GLUCOSE 125 (H) 08/25/2021 1825   GLUCOSE 122 (H) 02/04/2015 1001   BUN 24 (H) 08/27/2021 1240   BUN 19 (H) 02/04/2015 1001   CREATININE 1.60 (H) 08/27/2021 1240   CREATININE 1.26 02/04/2015 1001   CALCIUM 8.5 (L) 08/25/2021 1825   CALCIUM 8.5 02/04/2015 1001   GFRNONAA 41 (L) 08/27/2021 1240   GFRNONAA 58 (L) 02/04/2015 1001   GFRNONAA >60 11/06/2013 1304   GFRAA >60 02/05/2019 0121   GFRAA >60 02/04/2015 1001   GFRAA >60 11/06/2013 1304   CrCl cannot be calculated  (Patient's most recent lab result is older than the maximum 21 days allowed.).  COAG Lab Results  Component Value Date   INR 2.0 (H) 08/25/2021   INR 2.99 02/05/2019   INR 1.4 02/04/2015    Radiology VAS [place] ABI WITH/WO TBI  Result Date: 09/19/2021  LOWER EXTREMITY DOPPLER STUDY Patient Name:  [name] [name]  Date of Exam:   09/19/2021 Medical Rec #: [id number]         Accession #:    [id number] Date of Birth: Apr 29, 1929        Patient Gender: M Patient Age:   6 years Exam Location:  [facility] Procedure:      VAS [place] ABI WITH/WO TBI Referring Phys: Hortencia Pilar --------------------------------------------------------------------------------  Indications:  Peripheral artery disease.  Vascular Interventions: 08/30/2021 lt sfa pop stent. Comparison Study: 08/21/2021 Performing Technologist: Concha Norway RVT  Examination Guidelines: A complete evaluation includes at minimum, Doppler waveform signals and systolic blood pressure reading at the level of bilateral brachial, anterior tibial, and posterior tibial arteries, when vessel segments are accessible. Bilateral testing is considered an integral part of a complete examination. Photoelectric Plethysmograph (PPG) waveforms and toe systolic pressure readings are included as required and additional duplex testing as needed. Limited examinations for reoccurring indications may be performed as noted.  ABI Findings: +---------+------------------+-----+--------+--------+ Right    Rt Pressure (mmHg)IndexWaveformComment  +---------+------------------+-----+--------+--------+ Brachial 111                                     +---------+------------------+-----+--------+--------+ ATA                             absent           +---------+------------------+-----+--------+--------+ PTA                                     NonComp  +---------+------------------+-----+--------+--------+ Great Toe97                0.87 Normal            +---------+------------------+-----+--------+--------+ +---------+------------------+-----+----------+-------+ Left     Lt Pressure (mmHg)IndexWaveform  Comment +---------+------------------+-----+----------+-------+ Brachial 110                                      +---------+------------------+-----+----------+-------+ ATA                             monophasicNonComp +---------+------------------+-----+----------+-------+ PTA                             monophasicNonComp +---------+------------------+-----+----------+-------+ Great Toe64                0.58 Abnormal          +---------+------------------+-----+----------+-------+ +-------+-----------+-----------+------------+------------+ ABI/TBIToday's ABIToday's TBIPrevious ABIPrevious TBI +-------+-----------+-----------+------------+------------+ Right  NonComp    .87        NonComp     1.1          +-------+-----------+-----------+------------+------------+ Left   NonComp    .58        .55         .12          +-------+-----------+-----------+------------+------------+  Left TBIs appear increased compared to prior study on 08/21/2021.  Summary: Right: Resting right ankle-brachial index indicates noncompressible right lower extremity arteries. The right toe-brachial index is normal. Left: Resting left ankle-brachial index indicates noncompressible left lower extremity arteries. The left toe-brachial index is abnormal.  *See table(s) above for measurements and observations.  Electronically signed by Hortencia Pilar MD on 09/19/2021 at 4:58:14 PM.    Final    VAS [place] LOWER EXTREMITY ARTERIAL DUPLEX  Result Date: 09/19/2021 LOWER EXTREMITY ARTERIAL DUPLEX STUDY Patient Name:  [name] [name]  Date of Exam:   09/19/2021 Medical Rec #: [id number]         Accession #:    [id number] Date of Birth: 1929/10/21  Patient Gender: M Patient Age:   54 years Exam Location:  [facility] Procedure:       VAS [place] LOWER EXTREMITY ARTERIAL DUPLEX Referring Phys: [facility] --------------------------------------------------------------------------------  Indications: Peripheral artery disease.  Vascular Interventions: 08/30/2021 Lt PTA stent- SFA to Pop. Current ABI:            rt = [place] ; Lt = [place] Performing Technologist: Concha Norway RVT  Examination Guidelines: A complete evaluation includes B-mode imaging, spectral Doppler, color Doppler, and power Doppler as needed of all accessible portions of each vessel. Bilateral testing is considered an integral part of a complete examination. Limited examinations for reoccurring indications may be performed as noted.   +----------+--------+-----+--------+----------+--------+ LEFT      PSV cm/sRatioStenosisWaveform  Comments +----------+--------+-----+--------+----------+--------+ CFA Mid   105                  triphasic          +----------+--------+-----+--------+----------+--------+ DFA       34                   biphasic           +----------+--------+-----+--------+----------+--------+ SFA Prox  62                   monophasic         +----------+--------+-----+--------+----------+--------+ SFA Mid   69                   monophasicstent    +----------+--------+-----+--------+----------+--------+ SFA Distal102                  monophasicstent    +----------+--------+-----+--------+----------+--------+ POP Distal78                   monophasicstent    +----------+--------+-----+--------+----------+--------+ ATA Distal17                   monophasic         +----------+--------+-----+--------+----------+--------+ PTA Distal50                   monophasic         +----------+--------+-----+--------+----------+--------+  Summary: Left: Moderate atherosclerosis throughout. Patent SFA Pop stent.  See table(s) above for measurements and observations. Electronically signed by Hortencia Pilar MD on 09/19/2021 at 4:59:20  PM.    Final      Assessment/Plan 1. Atherosclerosis of native arteries of the extremities with ulceration ([place]) Recommend:  The patient is status post successful angiogram with intervention.  The patient reports that the claudication symptoms and leg pain is essentially gone.   The patient denies lifestyle limiting changes at this point in time.  No further invasive studies, angiography or surgery at this time The patient should continue walking and begin a more formal exercise program.  The patient should continue antiplatelet therapy and aggressive treatment of the lipid abnormalities  Patient should undergo noninvasive studies as ordered. The patient will follow up with me after the studies.    2. Chronic venous insufficiency No surgery or intervention at this point in time.    I have had a long discussion with the patient regarding venous insufficiency and why it  causes symptoms, specifically venous ulceration . I have discussed with the patient the chronic skin changes that accompany venous insufficiency and the long term sequela such as infection and recurring  ulceration.  Patient will be placed in [employer] which will be changed weekly drainage permitting.  In addition, behavioral modification including several periods of elevation of the lower extremities during the day will be continued. Achieving a position with the ankles at heart level was stressed to the patient  The patient is instructed to begin routine exercise, especially walking on a daily basis  Following the review of the ultrasound the patient will follow up in one week to reassess the degree of swelling and the control that [place] therapy is offering.   The patient can be assessed for graduated compression stockings or wraps as well as a Lymph Pump once the ulcers are healed.   3. Coronary artery disease of native artery of native heart with stable angina pectoris (HCC) Continue cardiac and antihypertensive  medications as already ordered and reviewed, no changes at this time.  Continue statin as ordered and reviewed, no changes at this time  Nitrates PRN for chest pain   4. Atrial fibrillation, unspecified type ([place]) Continue antiarrhythmia medications as already ordered, these medications have been reviewed and there are no changes at this time.  Continue anticoagulation as ordered by Cardiology Service   5. HTN (hypertension), benign Continue antihypertensive medications as already ordered, these medications have been reviewed and there are no changes at this time.   6. Type 2 diabetes mellitus with peripheral angiopathy ([place]) Continue hypoglycemic medications as already ordered, these medications have been reviewed and there are no changes at this time.  Hgb A1C to be monitored as already arranged by primary service     Hortencia Pilar, MD  10/02/2021 2:56 PM

## 2021-10-03 ENCOUNTER — Ambulatory Visit (INDEPENDENT_AMBULATORY_CARE_PROVIDER_SITE_OTHER): Payer: Medicare HMO | Admitting: Vascular Surgery

## 2021-10-03 ENCOUNTER — Encounter (INDEPENDENT_AMBULATORY_CARE_PROVIDER_SITE_OTHER): Payer: Medicare HMO

## 2021-10-03 ENCOUNTER — Other Ambulatory Visit: Payer: Self-pay

## 2021-10-03 VITALS — BP 105/58 | HR 71 | Resp 16 | Wt 164.0 lb

## 2021-10-03 DIAGNOSIS — E1151 Type 2 diabetes mellitus with diabetic peripheral angiopathy without gangrene: Secondary | ICD-10-CM | POA: Diagnosis not present

## 2021-10-03 DIAGNOSIS — I4891 Unspecified atrial fibrillation: Secondary | ICD-10-CM | POA: Diagnosis not present

## 2021-10-03 DIAGNOSIS — I25118 Atherosclerotic heart disease of native coronary artery with other forms of angina pectoris: Secondary | ICD-10-CM | POA: Diagnosis not present

## 2021-10-03 DIAGNOSIS — I10 Essential (primary) hypertension: Secondary | ICD-10-CM | POA: Diagnosis not present

## 2021-10-03 DIAGNOSIS — I872 Venous insufficiency (chronic) (peripheral): Secondary | ICD-10-CM

## 2021-10-03 DIAGNOSIS — I7025 Atherosclerosis of native arteries of other extremities with ulceration: Secondary | ICD-10-CM | POA: Diagnosis not present

## 2021-10-09 ENCOUNTER — Encounter (INDEPENDENT_AMBULATORY_CARE_PROVIDER_SITE_OTHER): Payer: Medicare HMO

## 2021-10-10 ENCOUNTER — Encounter (INDEPENDENT_AMBULATORY_CARE_PROVIDER_SITE_OTHER): Payer: Medicare HMO

## 2021-10-10 DIAGNOSIS — I4891 Unspecified atrial fibrillation: Secondary | ICD-10-CM | POA: Diagnosis not present

## 2021-10-10 DIAGNOSIS — I2510 Atherosclerotic heart disease of native coronary artery without angina pectoris: Secondary | ICD-10-CM | POA: Diagnosis not present

## 2021-10-10 DIAGNOSIS — I872 Venous insufficiency (chronic) (peripheral): Secondary | ICD-10-CM | POA: Diagnosis not present

## 2021-10-10 DIAGNOSIS — I83028 Varicose veins of left lower extremity with ulcer other part of lower leg: Secondary | ICD-10-CM | POA: Diagnosis not present

## 2021-10-10 DIAGNOSIS — I110 Hypertensive heart disease with heart failure: Secondary | ICD-10-CM | POA: Diagnosis not present

## 2021-10-10 DIAGNOSIS — I70222 Atherosclerosis of native arteries of extremities with rest pain, left leg: Secondary | ICD-10-CM | POA: Diagnosis not present

## 2021-10-10 DIAGNOSIS — I5023 Acute on chronic systolic (congestive) heart failure: Secondary | ICD-10-CM | POA: Diagnosis not present

## 2021-10-10 DIAGNOSIS — I70212 Atherosclerosis of native arteries of extremities with intermittent claudication, left leg: Secondary | ICD-10-CM | POA: Diagnosis not present

## 2021-10-10 DIAGNOSIS — L97822 Non-pressure chronic ulcer of other part of left lower leg with fat layer exposed: Secondary | ICD-10-CM | POA: Diagnosis not present

## 2021-10-12 DIAGNOSIS — I70222 Atherosclerosis of native arteries of extremities with rest pain, left leg: Secondary | ICD-10-CM | POA: Diagnosis not present

## 2021-10-12 DIAGNOSIS — I70212 Atherosclerosis of native arteries of extremities with intermittent claudication, left leg: Secondary | ICD-10-CM | POA: Diagnosis not present

## 2021-10-12 DIAGNOSIS — I4891 Unspecified atrial fibrillation: Secondary | ICD-10-CM | POA: Diagnosis not present

## 2021-10-12 DIAGNOSIS — I2510 Atherosclerotic heart disease of native coronary artery without angina pectoris: Secondary | ICD-10-CM | POA: Diagnosis not present

## 2021-10-12 DIAGNOSIS — I83028 Varicose veins of left lower extremity with ulcer other part of lower leg: Secondary | ICD-10-CM | POA: Diagnosis not present

## 2021-10-12 DIAGNOSIS — L97822 Non-pressure chronic ulcer of other part of left lower leg with fat layer exposed: Secondary | ICD-10-CM | POA: Diagnosis not present

## 2021-10-12 DIAGNOSIS — I5023 Acute on chronic systolic (congestive) heart failure: Secondary | ICD-10-CM | POA: Diagnosis not present

## 2021-10-12 DIAGNOSIS — I110 Hypertensive heart disease with heart failure: Secondary | ICD-10-CM | POA: Diagnosis not present

## 2021-10-12 DIAGNOSIS — I872 Venous insufficiency (chronic) (peripheral): Secondary | ICD-10-CM | POA: Diagnosis not present

## 2021-10-14 DIAGNOSIS — I70212 Atherosclerosis of native arteries of extremities with intermittent claudication, left leg: Secondary | ICD-10-CM | POA: Diagnosis not present

## 2021-10-14 DIAGNOSIS — L97822 Non-pressure chronic ulcer of other part of left lower leg with fat layer exposed: Secondary | ICD-10-CM | POA: Diagnosis not present

## 2021-10-14 DIAGNOSIS — I2510 Atherosclerotic heart disease of native coronary artery without angina pectoris: Secondary | ICD-10-CM | POA: Diagnosis not present

## 2021-10-14 DIAGNOSIS — I5023 Acute on chronic systolic (congestive) heart failure: Secondary | ICD-10-CM | POA: Diagnosis not present

## 2021-10-14 DIAGNOSIS — I872 Venous insufficiency (chronic) (peripheral): Secondary | ICD-10-CM | POA: Diagnosis not present

## 2021-10-14 DIAGNOSIS — I4891 Unspecified atrial fibrillation: Secondary | ICD-10-CM | POA: Diagnosis not present

## 2021-10-14 DIAGNOSIS — I70222 Atherosclerosis of native arteries of extremities with rest pain, left leg: Secondary | ICD-10-CM | POA: Diagnosis not present

## 2021-10-14 DIAGNOSIS — I110 Hypertensive heart disease with heart failure: Secondary | ICD-10-CM | POA: Diagnosis not present

## 2021-10-14 DIAGNOSIS — I83028 Varicose veins of left lower extremity with ulcer other part of lower leg: Secondary | ICD-10-CM | POA: Diagnosis not present

## 2021-10-16 ENCOUNTER — Encounter (INDEPENDENT_AMBULATORY_CARE_PROVIDER_SITE_OTHER): Payer: Self-pay | Admitting: Nurse Practitioner

## 2021-10-16 ENCOUNTER — Other Ambulatory Visit: Payer: Self-pay

## 2021-10-16 ENCOUNTER — Ambulatory Visit (INDEPENDENT_AMBULATORY_CARE_PROVIDER_SITE_OTHER): Payer: Medicare HMO | Admitting: Nurse Practitioner

## 2021-10-16 VITALS — BP 113/55 | HR 58 | Resp 16

## 2021-10-16 DIAGNOSIS — I890 Lymphedema, not elsewhere classified: Secondary | ICD-10-CM | POA: Diagnosis not present

## 2021-10-16 DIAGNOSIS — I10 Essential (primary) hypertension: Secondary | ICD-10-CM | POA: Diagnosis not present

## 2021-10-16 DIAGNOSIS — E782 Mixed hyperlipidemia: Secondary | ICD-10-CM | POA: Diagnosis not present

## 2021-10-17 ENCOUNTER — Ambulatory Visit (INDEPENDENT_AMBULATORY_CARE_PROVIDER_SITE_OTHER): Payer: Medicare HMO | Admitting: Nurse Practitioner

## 2021-10-18 ENCOUNTER — Encounter (INDEPENDENT_AMBULATORY_CARE_PROVIDER_SITE_OTHER): Payer: Self-pay | Admitting: Vascular Surgery

## 2021-10-18 DIAGNOSIS — I4891 Unspecified atrial fibrillation: Secondary | ICD-10-CM | POA: Diagnosis not present

## 2021-10-18 DIAGNOSIS — I110 Hypertensive heart disease with heart failure: Secondary | ICD-10-CM | POA: Diagnosis not present

## 2021-10-18 DIAGNOSIS — L97822 Non-pressure chronic ulcer of other part of left lower leg with fat layer exposed: Secondary | ICD-10-CM | POA: Diagnosis not present

## 2021-10-18 DIAGNOSIS — I70212 Atherosclerosis of native arteries of extremities with intermittent claudication, left leg: Secondary | ICD-10-CM | POA: Diagnosis not present

## 2021-10-18 DIAGNOSIS — I83028 Varicose veins of left lower extremity with ulcer other part of lower leg: Secondary | ICD-10-CM | POA: Diagnosis not present

## 2021-10-18 DIAGNOSIS — I872 Venous insufficiency (chronic) (peripheral): Secondary | ICD-10-CM | POA: Diagnosis not present

## 2021-10-18 DIAGNOSIS — I5023 Acute on chronic systolic (congestive) heart failure: Secondary | ICD-10-CM | POA: Diagnosis not present

## 2021-10-18 DIAGNOSIS — I70222 Atherosclerosis of native arteries of extremities with rest pain, left leg: Secondary | ICD-10-CM | POA: Diagnosis not present

## 2021-10-18 DIAGNOSIS — I2510 Atherosclerotic heart disease of native coronary artery without angina pectoris: Secondary | ICD-10-CM | POA: Diagnosis not present

## 2021-10-22 ENCOUNTER — Encounter (INDEPENDENT_AMBULATORY_CARE_PROVIDER_SITE_OTHER): Payer: Self-pay | Admitting: Nurse Practitioner

## 2021-10-22 DIAGNOSIS — I5023 Acute on chronic systolic (congestive) heart failure: Secondary | ICD-10-CM | POA: Diagnosis not present

## 2021-10-22 DIAGNOSIS — I110 Hypertensive heart disease with heart failure: Secondary | ICD-10-CM | POA: Diagnosis not present

## 2021-10-22 DIAGNOSIS — I4891 Unspecified atrial fibrillation: Secondary | ICD-10-CM | POA: Diagnosis not present

## 2021-10-22 DIAGNOSIS — I872 Venous insufficiency (chronic) (peripheral): Secondary | ICD-10-CM | POA: Diagnosis not present

## 2021-10-22 DIAGNOSIS — I83028 Varicose veins of left lower extremity with ulcer other part of lower leg: Secondary | ICD-10-CM | POA: Diagnosis not present

## 2021-10-22 DIAGNOSIS — I2510 Atherosclerotic heart disease of native coronary artery without angina pectoris: Secondary | ICD-10-CM | POA: Diagnosis not present

## 2021-10-22 DIAGNOSIS — I70212 Atherosclerosis of native arteries of extremities with intermittent claudication, left leg: Secondary | ICD-10-CM | POA: Diagnosis not present

## 2021-10-22 DIAGNOSIS — I70222 Atherosclerosis of native arteries of extremities with rest pain, left leg: Secondary | ICD-10-CM | POA: Diagnosis not present

## 2021-10-22 DIAGNOSIS — L97822 Non-pressure chronic ulcer of other part of left lower leg with fat layer exposed: Secondary | ICD-10-CM | POA: Diagnosis not present

## 2021-10-22 NOTE — Progress Notes (Signed)
Subjective:    Patient ID: [name] [name], male    DOB: December 10, 1929, 87 y.o.   MRN: [id number] Chief Complaint  Patient presents with   Follow-up    4 wk left unna wrap check    [name] [name] is an 87 year old male that presents today for follow-up evaluation of the wraps on the right lower extremity.  The patient had extensive bleeding prior to his angiogram and it has decreased but there is still some evidence of bleeding present.  There are no new wounds with the current existing wounds are greatly decreased.  The swelling is also decreased.  The patient has been tolerating the wraps well done by home health.   Review of Systems  Cardiovascular:  Positive for leg swelling.  All other systems reviewed and are negative.     Objective:   Physical Exam Vitals reviewed.  HENT:     Head: Normocephalic.  Cardiovascular:     Rate and Rhythm: Normal rate.     Pulses: Normal pulses.  Pulmonary:     Effort: Pulmonary effort is normal.  Neurological:     Mental Status: He is alert and oriented to person, place, and time.     Motor: Weakness present.     Gait: Gait abnormal.  Psychiatric:        Mood and Affect: Mood normal.        Behavior: Behavior normal.        Thought Content: Thought content normal.        Judgment: Judgment normal.    BP (!) 113/55 (BP Location: Right Arm)   Pulse (!) 58   Resp 16   Past Medical History:  Diagnosis Date   A-fib ([place])    B12 deficiency    Coronary artery disease    HTN (hypertension), benign 04/25/2014   Iron deficiency anemia    Thyroid disease    Type 2 diabetes mellitus with peripheral angiopathy ([place]) 11/10/2018    Social History   Socioeconomic History   Marital status: Widowed    Spouse name: Not on file   Number of children: 2   Years of education: Not on file   Highest education level: Not on file  Occupational History   Not on file  Tobacco Use   Smoking status: Never   Smokeless tobacco: Never  Vaping Use    Vaping Use: Never used  Substance and Sexual Activity   Alcohol use: No   Drug use: No   Sexual activity: Not on file  Other Topics Concern   Not on file  Social History Narrative   Lives by himself: daughter penny lives in [place]. Daughter Mariann Laster lives in town   Social Determinants of Health   Financial Resource Strain: Not on file  Food Insecurity: Not on file  Transportation Needs: Not on file  Physical Activity: Not on file  Stress: Not on file  Social Connections: Not on file  Intimate Partner Violence: Not on file    Past Surgical History:  Procedure Laterality Date   CARDIAC SURGERY     CABG [place] Left 08/30/2021   Procedure: LOWER EXTREMITY ANGIOGRAPHY;  Surgeon: Katha Cabal, MD;  Location: [facility];  Service: Cardiovascular;  Laterality: Left;    Family History  Problem Relation Age of Onset   Hypertension Mother    Diabetes Brother    Stroke Brother    Heart attack Brother  Allergies  Allergen Reactions   Cephalexin Rash   Spironolactone Nausea Only   Codeine Rash and Other (See Comments)    Can not sleep   Doxycycline Rash    CBC Latest Ref Rng & Units 08/25/2021 02/05/2019 02/04/2015  WBC 4.0 - 10.5 K/uL 11.3(H) 10.5 9.7  Hemoglobin 13.0 - 17.0 g/dL 9.3(L) 11.6(L) 11.8(L)  Hematocrit 39.0 - 52.0 % 26.6(L) 33.8(L) 35.7(L)  Platelets 150 - 400 K/uL 209 152 167      CMP     Component Value Date/Time   NA 136 08/25/2021 1825   NA 141 02/04/2015 1001   K 3.8 08/25/2021 1825   K 4.2 02/04/2015 1001   CL 100 08/25/2021 1825   CL 106 02/04/2015 1001   CO2 28 08/25/2021 1825   CO2 30 02/04/2015 1001   GLUCOSE 125 (H) 08/25/2021 1825   GLUCOSE 122 (H) 02/04/2015 1001   BUN 24 (H) 08/27/2021 1240   BUN 19 (H) 02/04/2015 1001   CREATININE 1.60 (H) 08/27/2021 1240   CREATININE 1.26 02/04/2015 1001   CALCIUM 8.5 (L) 08/25/2021 1825   CALCIUM 8.5 02/04/2015 1001   PROT 7.5 02/05/2019  0121   PROT 7.2 02/04/2015 1001   ALBUMIN 4.4 02/05/2019 0121   ALBUMIN 3.5 02/04/2015 1001   AST 27 02/05/2019 0121   AST 28 02/04/2015 1001   ALT 21 02/05/2019 0121   ALT 20 02/04/2015 1001   ALKPHOS 51 02/05/2019 0121   ALKPHOS 79 02/04/2015 1001   BILITOT 1.1 02/05/2019 0121   BILITOT 0.6 02/04/2015 1001   GFRNONAA 41 (L) 08/27/2021 1240   GFRNONAA 58 (L) 02/04/2015 1001   GFRNONAA >60 11/06/2013 1304   GFRAA >60 02/05/2019 0121   GFRAA >60 02/04/2015 1001   GFRAA >60 11/06/2013 1304     VAS [place] ABI WITH/WO TBI  Result Date: 09/19/2021  LOWER EXTREMITY DOPPLER STUDY Patient Name:  [name] [name]  Date of Exam:   09/19/2021 Medical Rec #: [id number]         Accession #:    [id number] Date of Birth: Aug 18, 1929        Patient Gender: M Patient Age:   9 years Exam Location:  [facility] Procedure:      VAS [place] ABI WITH/WO TBI Referring Phys: GREGORY SCHNIER --------------------------------------------------------------------------------  Indications: Peripheral artery disease.  Vascular Interventions: 08/30/2021 lt sfa pop stent. Comparison Study: 08/21/2021 Performing Technologist: Concha [place] RVT  Examination Guidelines: A complete evaluation includes at minimum, Doppler waveform signals and systolic blood pressure reading at the level of bilateral brachial, anterior tibial, and posterior tibial arteries, when vessel segments are accessible. Bilateral testing is considered an integral part of a complete examination. Photoelectric Plethysmograph (PPG) waveforms and toe systolic pressure readings are included as required and additional duplex testing as needed. Limited examinations for reoccurring indications may be performed as noted.  ABI Findings: +---------+------------------+-----+--------+--------+ Right    Rt Pressure (mmHg)IndexWaveformComment  +---------+------------------+-----+--------+--------+ Brachial 111                                      +---------+------------------+-----+--------+--------+ ATA                             absent           +---------+------------------+-----+--------+--------+ PTA  NonComp  +---------+------------------+-----+--------+--------+ Great Toe97                0.87 Normal           +---------+------------------+-----+--------+--------+ +---------+------------------+-----+----------+-------+ Left     Lt Pressure (mmHg)IndexWaveform  Comment +---------+------------------+-----+----------+-------+ Brachial 110                                      +---------+------------------+-----+----------+-------+ ATA                             monophasicNonComp +---------+------------------+-----+----------+-------+ PTA                             monophasicNonComp +---------+------------------+-----+----------+-------+ Great Toe64                0.58 Abnormal          +---------+------------------+-----+----------+-------+ +-------+-----------+-----------+------------+------------+ ABI/TBIToday's ABIToday's TBIPrevious ABIPrevious TBI +-------+-----------+-----------+------------+------------+ Right  NonComp    .87        NonComp     1.1          +-------+-----------+-----------+------------+------------+ Left   NonComp    .58        .55         .12          +-------+-----------+-----------+------------+------------+  Left TBIs appear increased compared to prior study on 08/21/2021.  Summary: Right: Resting right ankle-brachial index indicates noncompressible right lower extremity arteries. The right toe-brachial index is normal. Left: Resting left ankle-brachial index indicates noncompressible left lower extremity arteries. The left toe-brachial index is abnormal.  *See table(s) above for measurements and observations.  Electronically signed by Hortencia Pilar MD on 09/19/2021 at 4:58:14 PM.    Final        Assessment & Plan:   1.  Lymphedema Today the swelling appears improved as to the ulcerations however the patient still continues to have some weeping from his leg.  I discussed with the patient the concern that this may be related to some fluid overload status that he should discuss with his PCP or cardiologist about possible change in his diuretics.  Otherwise we will have the patient return for weekly wrap changes as there is still room for improvement.  I will see the patient in 4 weeks to reevaluate progress.  2. HTN (hypertension), benign Continue antihypertensive medications as already ordered, these medications have been reviewed and there are no changes at this time.   3. Mixed hyperlipidemia Continue statin as ordered and reviewed, no changes at this time    Current Outpatient Medications on File Prior to Visit  Medication Sig Dispense Refill   acetaminophen (TYLENOL) 500 MG tablet Take 1,000 mg by mouth every 6 (six) hours as needed for moderate pain.     apixaban (ELIQUIS) 5 MG TABS tablet Take 5 mg by mouth 2 (two) times daily.     aspirin EC 81 MG EC tablet Take 1 tablet (81 mg total) by mouth daily. Swallow whole. 30 tablet 11   carvedilol (COREG) 3.125 MG tablet Take by mouth.     cetirizine (ZYRTEC) 10 MG tablet Take 10 mg by mouth daily as needed for allergies.     Cyanocobalamin (VITAMIN B-12) 1000 MCG SUBL Take 1,000 mcg by mouth daily.     empagliflozin (JARDIANCE) 10 MG TABS tablet Take 1 tablet by mouth daily.  ferrous sulfate 325 (65 FE) MG EC tablet Take 325 mg by mouth daily.     levothyroxine (SYNTHROID, LEVOTHROID) 112 MCG tablet Take 112 mcg by mouth daily.     metoprolol succinate (TOPROL-XL) 50 MG 24 hr tablet Take 50 mg by mouth daily.     Multiple Vitamins-Minerals (MULTIVITAMIN WITH MINERALS) tablet Take 1 tablet by mouth daily.     Omega-3 Fatty Acids (FISH OIL) 1000 MG CAPS Take 1,000 mg by mouth in the morning and at bedtime.     polyethylene glycol (MIRALAX / GLYCOLAX) packet  Take 17 g by mouth daily. Mix one tablespoon with 8oz of your favorite juice or water every day until you are having soft formed stools. Then start taking once daily if you didn't have a stool the day before. 30 each 0   simvastatin (ZOCOR) 40 MG tablet Take 40 mg by mouth daily.     sulfamethoxazole-trimethoprim (BACTRIM) 400-80 MG tablet Take 1 tablet by mouth 2 (two) times daily. 14 tablet 0   torsemide (DEMADEX) 20 MG tablet Take 40 mg by mouth daily.     warfarin (COUMADIN) 5 MG tablet Take 5 mg by mouth at bedtime.     omeprazole (PRILOSEC) 20 MG capsule Take 20 mg by mouth daily.     No current facility-administered medications on file prior to visit.    There are no Patient Instructions on file for this visit. No follow-ups on file.   Kris Hartmann, NP

## 2021-10-23 DIAGNOSIS — R21 Rash and other nonspecific skin eruption: Secondary | ICD-10-CM | POA: Diagnosis not present

## 2021-10-23 DIAGNOSIS — I429 Cardiomyopathy, unspecified: Secondary | ICD-10-CM | POA: Diagnosis not present

## 2021-10-26 DIAGNOSIS — I70212 Atherosclerosis of native arteries of extremities with intermittent claudication, left leg: Secondary | ICD-10-CM | POA: Diagnosis not present

## 2021-10-26 DIAGNOSIS — I2510 Atherosclerotic heart disease of native coronary artery without angina pectoris: Secondary | ICD-10-CM | POA: Diagnosis not present

## 2021-10-26 DIAGNOSIS — I110 Hypertensive heart disease with heart failure: Secondary | ICD-10-CM | POA: Diagnosis not present

## 2021-10-26 DIAGNOSIS — I4891 Unspecified atrial fibrillation: Secondary | ICD-10-CM | POA: Diagnosis not present

## 2021-10-26 DIAGNOSIS — I70222 Atherosclerosis of native arteries of extremities with rest pain, left leg: Secondary | ICD-10-CM | POA: Diagnosis not present

## 2021-10-26 DIAGNOSIS — I5023 Acute on chronic systolic (congestive) heart failure: Secondary | ICD-10-CM | POA: Diagnosis not present

## 2021-10-26 DIAGNOSIS — I872 Venous insufficiency (chronic) (peripheral): Secondary | ICD-10-CM | POA: Diagnosis not present

## 2021-10-26 DIAGNOSIS — I83028 Varicose veins of left lower extremity with ulcer other part of lower leg: Secondary | ICD-10-CM | POA: Diagnosis not present

## 2021-10-26 DIAGNOSIS — L97822 Non-pressure chronic ulcer of other part of left lower leg with fat layer exposed: Secondary | ICD-10-CM | POA: Diagnosis not present

## 2021-10-29 DIAGNOSIS — I4891 Unspecified atrial fibrillation: Secondary | ICD-10-CM | POA: Diagnosis not present

## 2021-10-29 DIAGNOSIS — I2510 Atherosclerotic heart disease of native coronary artery without angina pectoris: Secondary | ICD-10-CM | POA: Diagnosis not present

## 2021-10-29 DIAGNOSIS — I872 Venous insufficiency (chronic) (peripheral): Secondary | ICD-10-CM | POA: Diagnosis not present

## 2021-10-29 DIAGNOSIS — I70212 Atherosclerosis of native arteries of extremities with intermittent claudication, left leg: Secondary | ICD-10-CM | POA: Diagnosis not present

## 2021-10-29 DIAGNOSIS — I5023 Acute on chronic systolic (congestive) heart failure: Secondary | ICD-10-CM | POA: Diagnosis not present

## 2021-10-29 DIAGNOSIS — I83028 Varicose veins of left lower extremity with ulcer other part of lower leg: Secondary | ICD-10-CM | POA: Diagnosis not present

## 2021-10-29 DIAGNOSIS — I70222 Atherosclerosis of native arteries of extremities with rest pain, left leg: Secondary | ICD-10-CM | POA: Diagnosis not present

## 2021-10-29 DIAGNOSIS — L97822 Non-pressure chronic ulcer of other part of left lower leg with fat layer exposed: Secondary | ICD-10-CM | POA: Diagnosis not present

## 2021-10-29 DIAGNOSIS — I110 Hypertensive heart disease with heart failure: Secondary | ICD-10-CM | POA: Diagnosis not present

## 2021-10-31 ENCOUNTER — Ambulatory Visit (INDEPENDENT_AMBULATORY_CARE_PROVIDER_SITE_OTHER): Payer: Medicare HMO | Admitting: Nurse Practitioner

## 2021-11-01 DIAGNOSIS — L97822 Non-pressure chronic ulcer of other part of left lower leg with fat layer exposed: Secondary | ICD-10-CM | POA: Diagnosis not present

## 2021-11-01 DIAGNOSIS — I70222 Atherosclerosis of native arteries of extremities with rest pain, left leg: Secondary | ICD-10-CM | POA: Diagnosis not present

## 2021-11-01 DIAGNOSIS — I872 Venous insufficiency (chronic) (peripheral): Secondary | ICD-10-CM | POA: Diagnosis not present

## 2021-11-01 DIAGNOSIS — I5023 Acute on chronic systolic (congestive) heart failure: Secondary | ICD-10-CM | POA: Diagnosis not present

## 2021-11-01 DIAGNOSIS — I2510 Atherosclerotic heart disease of native coronary artery without angina pectoris: Secondary | ICD-10-CM | POA: Diagnosis not present

## 2021-11-01 DIAGNOSIS — I4891 Unspecified atrial fibrillation: Secondary | ICD-10-CM | POA: Diagnosis not present

## 2021-11-01 DIAGNOSIS — I83028 Varicose veins of left lower extremity with ulcer other part of lower leg: Secondary | ICD-10-CM | POA: Diagnosis not present

## 2021-11-01 DIAGNOSIS — I110 Hypertensive heart disease with heart failure: Secondary | ICD-10-CM | POA: Diagnosis not present

## 2021-11-01 DIAGNOSIS — I70212 Atherosclerosis of native arteries of extremities with intermittent claudication, left leg: Secondary | ICD-10-CM | POA: Diagnosis not present

## 2021-11-04 ENCOUNTER — Telehealth (INDEPENDENT_AMBULATORY_CARE_PROVIDER_SITE_OTHER): Payer: Self-pay

## 2021-11-04 DIAGNOSIS — I5023 Acute on chronic systolic (congestive) heart failure: Secondary | ICD-10-CM | POA: Diagnosis not present

## 2021-11-04 DIAGNOSIS — I83028 Varicose veins of left lower extremity with ulcer other part of lower leg: Secondary | ICD-10-CM | POA: Diagnosis not present

## 2021-11-04 DIAGNOSIS — I70222 Atherosclerosis of native arteries of extremities with rest pain, left leg: Secondary | ICD-10-CM | POA: Diagnosis not present

## 2021-11-04 DIAGNOSIS — I2510 Atherosclerotic heart disease of native coronary artery without angina pectoris: Secondary | ICD-10-CM | POA: Diagnosis not present

## 2021-11-04 DIAGNOSIS — I110 Hypertensive heart disease with heart failure: Secondary | ICD-10-CM | POA: Diagnosis not present

## 2021-11-04 DIAGNOSIS — I872 Venous insufficiency (chronic) (peripheral): Secondary | ICD-10-CM | POA: Diagnosis not present

## 2021-11-04 DIAGNOSIS — L97822 Non-pressure chronic ulcer of other part of left lower leg with fat layer exposed: Secondary | ICD-10-CM | POA: Diagnosis not present

## 2021-11-04 DIAGNOSIS — I4891 Unspecified atrial fibrillation: Secondary | ICD-10-CM | POA: Diagnosis not present

## 2021-11-04 DIAGNOSIS — I70212 Atherosclerosis of native arteries of extremities with intermittent claudication, left leg: Secondary | ICD-10-CM | POA: Diagnosis not present

## 2021-11-04 NOTE — Telephone Encounter (Signed)
Lori from [facility] called stating that the patient right leg has been weeping and requesting verbal orders to unna wrap. Cecille Rubin was made aware that it was fine to wrap the right leg.

## 2021-11-09 DIAGNOSIS — I4891 Unspecified atrial fibrillation: Secondary | ICD-10-CM | POA: Diagnosis not present

## 2021-11-09 DIAGNOSIS — I70212 Atherosclerosis of native arteries of extremities with intermittent claudication, left leg: Secondary | ICD-10-CM | POA: Diagnosis not present

## 2021-11-09 DIAGNOSIS — I70222 Atherosclerosis of native arteries of extremities with rest pain, left leg: Secondary | ICD-10-CM | POA: Diagnosis not present

## 2021-11-09 DIAGNOSIS — L97822 Non-pressure chronic ulcer of other part of left lower leg with fat layer exposed: Secondary | ICD-10-CM | POA: Diagnosis not present

## 2021-11-09 DIAGNOSIS — I5023 Acute on chronic systolic (congestive) heart failure: Secondary | ICD-10-CM | POA: Diagnosis not present

## 2021-11-09 DIAGNOSIS — I83028 Varicose veins of left lower extremity with ulcer other part of lower leg: Secondary | ICD-10-CM | POA: Diagnosis not present

## 2021-11-09 DIAGNOSIS — I110 Hypertensive heart disease with heart failure: Secondary | ICD-10-CM | POA: Diagnosis not present

## 2021-11-09 DIAGNOSIS — I2510 Atherosclerotic heart disease of native coronary artery without angina pectoris: Secondary | ICD-10-CM | POA: Diagnosis not present

## 2021-11-09 DIAGNOSIS — I872 Venous insufficiency (chronic) (peripheral): Secondary | ICD-10-CM | POA: Diagnosis not present

## 2021-11-12 ENCOUNTER — Telehealth (INDEPENDENT_AMBULATORY_CARE_PROVIDER_SITE_OTHER): Payer: Self-pay | Admitting: Vascular Surgery

## 2021-11-12 DIAGNOSIS — I70222 Atherosclerosis of native arteries of extremities with rest pain, left leg: Secondary | ICD-10-CM | POA: Diagnosis not present

## 2021-11-12 DIAGNOSIS — L97822 Non-pressure chronic ulcer of other part of left lower leg with fat layer exposed: Secondary | ICD-10-CM | POA: Diagnosis not present

## 2021-11-12 DIAGNOSIS — I70212 Atherosclerosis of native arteries of extremities with intermittent claudication, left leg: Secondary | ICD-10-CM | POA: Diagnosis not present

## 2021-11-12 DIAGNOSIS — I2510 Atherosclerotic heart disease of native coronary artery without angina pectoris: Secondary | ICD-10-CM | POA: Diagnosis not present

## 2021-11-12 DIAGNOSIS — I4891 Unspecified atrial fibrillation: Secondary | ICD-10-CM | POA: Diagnosis not present

## 2021-11-12 DIAGNOSIS — I110 Hypertensive heart disease with heart failure: Secondary | ICD-10-CM | POA: Diagnosis not present

## 2021-11-12 DIAGNOSIS — I83028 Varicose veins of left lower extremity with ulcer other part of lower leg: Secondary | ICD-10-CM | POA: Diagnosis not present

## 2021-11-12 DIAGNOSIS — I5023 Acute on chronic systolic (congestive) heart failure: Secondary | ICD-10-CM | POA: Diagnosis not present

## 2021-11-12 DIAGNOSIS — I872 Venous insufficiency (chronic) (peripheral): Secondary | ICD-10-CM | POA: Diagnosis not present

## 2021-11-12 NOTE — Telephone Encounter (Signed)
I recommended patient daughter to have her father legs wrap by home health since they will need to reschedule his upcoming appointment.

## 2021-11-12 NOTE — Telephone Encounter (Signed)
LVM on daughter's phone asking for a call back to R/S pt's appt tomorrow 11.16.22.

## 2021-11-13 ENCOUNTER — Ambulatory Visit (INDEPENDENT_AMBULATORY_CARE_PROVIDER_SITE_OTHER): Payer: Medicare HMO | Admitting: Nurse Practitioner

## 2021-11-15 DIAGNOSIS — I5023 Acute on chronic systolic (congestive) heart failure: Secondary | ICD-10-CM | POA: Diagnosis not present

## 2021-11-15 DIAGNOSIS — I110 Hypertensive heart disease with heart failure: Secondary | ICD-10-CM | POA: Diagnosis not present

## 2021-11-15 DIAGNOSIS — I2510 Atherosclerotic heart disease of native coronary artery without angina pectoris: Secondary | ICD-10-CM | POA: Diagnosis not present

## 2021-11-15 DIAGNOSIS — I70222 Atherosclerosis of native arteries of extremities with rest pain, left leg: Secondary | ICD-10-CM | POA: Diagnosis not present

## 2021-11-15 DIAGNOSIS — L97822 Non-pressure chronic ulcer of other part of left lower leg with fat layer exposed: Secondary | ICD-10-CM | POA: Diagnosis not present

## 2021-11-15 DIAGNOSIS — I872 Venous insufficiency (chronic) (peripheral): Secondary | ICD-10-CM | POA: Diagnosis not present

## 2021-11-15 DIAGNOSIS — I4891 Unspecified atrial fibrillation: Secondary | ICD-10-CM | POA: Diagnosis not present

## 2021-11-15 DIAGNOSIS — I70212 Atherosclerosis of native arteries of extremities with intermittent claudication, left leg: Secondary | ICD-10-CM | POA: Diagnosis not present

## 2021-11-15 DIAGNOSIS — I83028 Varicose veins of left lower extremity with ulcer other part of lower leg: Secondary | ICD-10-CM | POA: Diagnosis not present

## 2021-11-18 ENCOUNTER — Ambulatory Visit: Payer: Medicare HMO | Admitting: Dermatology

## 2021-11-18 ENCOUNTER — Other Ambulatory Visit: Payer: Self-pay

## 2021-11-18 ENCOUNTER — Other Ambulatory Visit: Payer: Self-pay | Admitting: Dermatology

## 2021-11-18 DIAGNOSIS — E1151 Type 2 diabetes mellitus with diabetic peripheral angiopathy without gangrene: Secondary | ICD-10-CM | POA: Diagnosis not present

## 2021-11-18 DIAGNOSIS — L820 Inflamed seborrheic keratosis: Secondary | ICD-10-CM

## 2021-11-18 DIAGNOSIS — E538 Deficiency of other specified B group vitamins: Secondary | ICD-10-CM | POA: Diagnosis not present

## 2021-11-18 DIAGNOSIS — L578 Other skin changes due to chronic exposure to nonionizing radiation: Secondary | ICD-10-CM

## 2021-11-18 DIAGNOSIS — C4492 Squamous cell carcinoma of skin, unspecified: Secondary | ICD-10-CM

## 2021-11-18 DIAGNOSIS — C44222 Squamous cell carcinoma of skin of right ear and external auricular canal: Secondary | ICD-10-CM

## 2021-11-18 DIAGNOSIS — D485 Neoplasm of uncertain behavior of skin: Secondary | ICD-10-CM

## 2021-11-18 DIAGNOSIS — L570 Actinic keratosis: Secondary | ICD-10-CM | POA: Diagnosis not present

## 2021-11-18 HISTORY — DX: Squamous cell carcinoma of skin, unspecified: C44.92

## 2021-11-18 NOTE — Progress Notes (Signed)
Follow-Up Visit   Subjective  [name] [name] is a 87 y.o. male who presents for the following: AK  (Of the face and ears - check for new or persistent skin lesions.). Irregular crusted ulceration at the R ear/neck area. Patient is concerned and would like it checked today.  The patient has spots, moles and lesions to be evaluated, some may be new or changing and the patient has concerns that these could be cancer.  The following portions of the chart were reviewed this encounter and updated as appropriate:   Tobacco  Allergies  Meds  Problems  Med Hx  Surg Hx  Fam Hx     Review of Systems:  No other skin or systemic complaints except as noted in HPI or Assessment and Plan.  Objective  Well appearing patient in no apparent distress; mood and affect are within normal limits.  A focused examination was performed including the face and ears. Relevant physical exam findings are noted in the Assessment and Plan.  R ear and mandible 2.0 cm crusted ulceration.      Face x 9 (9) Erythematous thin papules/macules with gritty scale.   Face x 3 (3) Erythematous keratotic or waxy stuck-on papule or plaque.    Assessment & Plan  Neoplasm of uncertain behavior of skin R ear and mandible  Epidermal / dermal shaving  Lesion diameter (cm):  2 Informed consent: discussed and consent obtained   Timeout: patient name, date of birth, surgical site, and procedure verified   Procedure prep:  Patient was prepped and draped in usual sterile fashion Prep type:  Isopropyl alcohol Anesthesia: the lesion was anesthetized in a standard fashion   Anesthetic:  1% lidocaine w/ epinephrine 1-100,000 buffered w/ 8.4% NaHCO3 Instrument used: flexible razor blade   Hemostasis achieved with: pressure, aluminum chloride and electrodesiccation   Outcome: patient tolerated procedure well   Post-procedure details: sterile dressing applied and wound care instructions given   Dressing type: bandage and  petrolatum    Destruction of lesion Complexity: extensive   Destruction method: electrodesiccation and curettage   Informed consent: discussed and consent obtained   Timeout:  patient name, date of birth, surgical site, and procedure verified Procedure prep:  Patient was prepped and draped in usual sterile fashion Prep type:  Isopropyl alcohol Anesthesia: the lesion was anesthetized in a standard fashion   Anesthetic:  1% lidocaine w/ epinephrine 1-100,000 buffered w/ 8.4% NaHCO3 Curettage performed in three different directions: Yes   Electrodesiccation performed over the curetted area: Yes   Lesion length (cm):  2 Lesion width (cm):  2 Margin per side (cm):  0.2 Final wound size (cm):  2.4 Hemostasis achieved with:  pressure, aluminum chloride and electrodesiccation Outcome: patient tolerated procedure well with no complications   Post-procedure details: sterile dressing applied and wound care instructions given   Dressing type: bandage and petrolatum    Specimen 1 - Surgical pathology Differential Diagnosis: D48.5 r/o SCC vs BCC vs other  ED&C today  Check Margins: No  AK (actinic keratosis) (9) Face x 9  Destruction of lesion - Face x 9 Complexity: simple   Destruction method: cryotherapy   Informed consent: discussed and consent obtained   Timeout:  patient name, date of birth, surgical site, and procedure verified Lesion destroyed using liquid nitrogen: Yes   Region frozen until ice ball extended beyond lesion: Yes   Outcome: patient tolerated procedure well with no complications   Post-procedure details: wound care instructions given  Inflamed seborrheic keratosis Face x 3  Destruction of lesion - Face x 3 Complexity: simple   Destruction method: cryotherapy   Informed consent: discussed and consent obtained   Timeout:  patient name, date of birth, surgical site, and procedure verified Lesion destroyed using liquid nitrogen: Yes   Region frozen until ice ball  extended beyond lesion: Yes   Outcome: patient tolerated procedure well with no complications   Post-procedure details: wound care instructions given    Actinic Damage - chronic, secondary to cumulative UV radiation exposure/sun exposure over time - diffuse scaly erythematous macules with underlying dyspigmentation - Recommend daily broad spectrum sunscreen SPF 30+ to sun-exposed areas, reapply every 2 hours as needed.  - Recommend staying in the shade or wearing long sleeves, sun glasses (UVA+UVB protection) and wide brim hats (4-inch brim around the entire circumference of the hat). - Call for new or changing lesions.  Return in about 3 months (around 02/18/2022) for Ak, ISK, and biopsy follow up .  Luther Redo, CMA, am acting as scribe for Sarina Ser, MD . Documentation: I have reviewed the above documentation for accuracy and completeness, and I agree with the above.  Sarina Ser, MD

## 2021-11-18 NOTE — Patient Instructions (Signed)
Electrodesiccation and Curettage ("Scrape and Burn") Wound Care Instructions  Leave the original bandage on for 24 hours if possible.  If the bandage becomes soaked or soiled before that time, it is OK to remove it and examine the wound.  A small amount of post-operative bleeding is normal.  If excessive bleeding occurs, remove the bandage, place gauze over the site and apply continuous pressure (no peeking) over the area for 30 minutes. If this does not work, please call our clinic as soon as possible or page your doctor if it is after hours.   Once a day, cleanse the wound with soap and water. It is fine to shower. If a thick crust develops you may use a Q-tip dipped into dilute hydrogen peroxide (mix 1:1 with water) to dissolve it.  Hydrogen peroxide can slow the healing process, so use it only as needed.    After washing, apply petroleum jelly (Vaseline) or an antibiotic ointment if your doctor prescribed one for you, followed by a bandage.    For best healing, the wound should be covered with a layer of ointment at all times. If you are not able to keep the area covered with a bandage to hold the ointment in place, this may mean re-applying the ointment several times a day.  Continue this wound care until the wound has healed and is no longer open. It may take several weeks for the wound to heal and close.  Itching and mild discomfort is normal during the healing process.  If you have any discomfort, you can take Tylenol (acetaminophen) or ibuprofen as directed on the bottle. (Please do not take these if you have an allergy to them or cannot take them for another reason).  Some redness, tenderness and white or yellow material in the wound is normal healing.  If the area becomes very sore and red, or develops a thick yellow-green material (pus), it may be infected; please notify us.    Wound healing continues for up to one year following surgery. It is not unusual to experience pain in the scar  from time to time during the interval.  If the pain becomes severe or the scar thickens, you should notify the office.    A slight amount of redness in a scar is expected for the first six months.  After six months, the redness will fade and the scar will soften and fade.  The color difference becomes less noticeable with time.  If there are any problems, return for a post-op surgery check at your earliest convenience.  To improve the appearance of the scar, you can use silicone scar gel, cream, or sheets (such as Mederma or Serica) every night for up to one year. These are available over the counter (without a prescription).  Please call our office at [phone] for any questions or concerns.  If You Need Anything After Your Visit  If you have any questions or concerns for your doctor, please call our main line at [phone] and press option 4 to reach your doctor's medical assistant. If no one answers, please leave a voicemail as directed and we will return your call as soon as possible. Messages left after 4 pm will be answered the following business day.   You may also send [place] a message via [place]. We typically respond to MyChart messages within 1-2 business days.  For prescription refills, please ask your pharmacy to contact our office. Our fax number is [phone].  If you have an  urgent issue when the clinic is closed that cannot wait until the next business day, you can page your doctor at the number below.    Please note that while we do our best to be available for urgent issues outside of office hours, we are not available 24/7.   If you have an urgent issue and are unable to reach [place], you may choose to seek medical care at your doctor's office, retail clinic, urgent care center, or emergency room.  If you have a medical emergency, please immediately call 911 or go to the emergency department.  Pager Numbers  - Dr. Nehemiah Massed: [phone]  - Dr. Laurence Ferrari: [phone]  -  Dr. Nicole Kindred: [phone]  In the event of inclement weather, please call our main line at [phone] for an update on the status of any delays or closures.  Dermatology Medication Tips: Please keep the boxes that topical medications come in in order to help keep track of the instructions about where and how to use these. Pharmacies typically print the medication instructions only on the boxes and not directly on the medication tubes.   If your medication is too expensive, please contact our office at [phone] option 4 or send [place] a message through [place].   We are unable to tell what your co-pay for medications will be in advance as this is different depending on your insurance coverage. However, we may be able to find a substitute medication at lower cost or fill out paperwork to get insurance to cover a needed medication.   If a prior authorization is required to get your medication covered by your insurance company, please allow [place] 1-2 business days to complete this process.  Drug prices often vary depending on where the prescription is filled and some pharmacies may offer cheaper prices.  The website www.goodrx.com contains coupons for medications through different pharmacies. The prices here do not account for what the cost may be with help from insurance (it may be cheaper with your insurance), but the website can give you the price if you did not use any insurance.  - You can print the associated coupon and take it with your prescription to the pharmacy.  - You may also stop by our office during regular business hours and pick up a GoodRx coupon card.  - If you need your prescription sent electronically to a different pharmacy, notify our office through [facility] or by phone at [phone] option 4.     Si Usted Necesita Algo Despus de Su Visita  Tambin puede enviarnos un mensaje a travs de Pharmacist, community. Por lo general respondemos a los mensajes de MyChart en el  transcurso de 1 a 2 das hbiles.  Para renovar recetas, por favor pida a su farmacia que se ponga en contacto con nuestra oficina. Harland Dingwall de fax es [place] [phone].  Si tiene un asunto urgente cuando la clnica est cerrada y que no puede esperar hasta el siguiente da hbil, puede llamar/localizar a su doctor(a) al nmero que aparece a continuacin.   Por favor, tenga en cuenta que aunque hacemos todo lo posible para estar disponibles para asuntos urgentes fuera del horario de [place], no estamos disponibles las 24 horas del da, los 7 das de la [place].   Si tiene un problema urgente y no puede comunicarse con nosotros, puede optar por buscar atencin mdica  en el consultorio de su doctor(a), en una clnica privada, en un centro de atencin urgente o en una sala de emergencias.  Si tiene Engineering geologist, por favor llame inmediatamente al 911 o vaya a la sala de emergencias.  Nmeros de bper  - Dr. Nehemiah Massed: [phone]  - Dra. Moye: [phone]  - Dra. Nicole Kindred: [phone]  En caso de inclemencias del [place], por favor llame a Johnsie Kindred principal al [phone] para una actualizacin sobre el [place] de cualquier retraso o cierre.  Consejos para la medicacin en dermatologa: Por favor, guarde las cajas en las que vienen los medicamentos de uso tpico para ayudarle a seguir las instrucciones sobre dnde y cmo usarlos. Las farmacias generalmente imprimen las instrucciones del medicamento slo en las cajas y no directamente en los tubos del [place].   Si su medicamento es muy caro, por favor, pngase en contacto con Zigmund Daniel llamando al [phone] y presione la opcin 4 o envenos un mensaje a travs de Pharmacist, community.   No podemos decirle cul ser su copago por los medicamentos por adelantado ya que esto es diferente dependiendo de la cobertura de su seguro. Sin embargo, es posible que podamos encontrar un medicamento sustituto a Electrical engineer un  formulario para que el seguro cubra el medicamento que se considera necesario.   Si se requiere una autorizacin previa para que su compaa de seguros [place] su medicamento, por favor permtanos de 1 a 2 das hbiles para completar este proceso.  Los precios de los medicamentos varan con frecuencia dependiendo del Environmental consultant de dnde se surte la receta y alguna farmacias pueden ofrecer precios ms baratos.  El sitio web www.goodrx.com tiene cupones para medicamentos de Airline pilot. Los precios aqu no tienen en cuenta lo que podra costar con la ayuda del seguro (puede ser ms barato con su seguro), pero el sitio web puede darle el precio si no utiliz Research scientist (physical sciences).  - Puede imprimir el cupn correspondiente y llevarlo con su receta a la farmacia.  - Tambin puede pasar por nuestra oficina durante el horario de atencin regular y Charity fundraiser una tarjeta de cupones de GoodRx.  - Si necesita que su receta se enve electrnicamente a una farmacia diferente, informe a nuestra oficina a travs de MyChart de [place] o por telfono llamando al [phone] y presione la opcin 4.

## 2021-11-19 ENCOUNTER — Ambulatory Visit (INDEPENDENT_AMBULATORY_CARE_PROVIDER_SITE_OTHER): Payer: Medicare HMO | Admitting: Nurse Practitioner

## 2021-11-19 VITALS — BP 109/70 | HR 64 | Ht 68.0 in | Wt 170.0 lb

## 2021-11-19 DIAGNOSIS — I10 Essential (primary) hypertension: Secondary | ICD-10-CM | POA: Diagnosis not present

## 2021-11-19 DIAGNOSIS — E782 Mixed hyperlipidemia: Secondary | ICD-10-CM | POA: Diagnosis not present

## 2021-11-19 DIAGNOSIS — I890 Lymphedema, not elsewhere classified: Secondary | ICD-10-CM

## 2021-11-20 ENCOUNTER — Other Ambulatory Visit: Payer: Self-pay

## 2021-11-20 ENCOUNTER — Ambulatory Visit (INDEPENDENT_AMBULATORY_CARE_PROVIDER_SITE_OTHER): Payer: Medicare HMO | Admitting: Nurse Practitioner

## 2021-11-20 ENCOUNTER — Ambulatory Visit: Payer: Medicare HMO

## 2021-11-20 ENCOUNTER — Encounter (INDEPENDENT_AMBULATORY_CARE_PROVIDER_SITE_OTHER): Payer: Self-pay | Admitting: Nurse Practitioner

## 2021-11-20 VITALS — BP 110/59 | HR 58 | Ht 68.0 in | Wt 169.0 lb

## 2021-11-20 DIAGNOSIS — I70213 Atherosclerosis of native arteries of extremities with intermittent claudication, bilateral legs: Secondary | ICD-10-CM

## 2021-11-20 DIAGNOSIS — R58 Hemorrhage, not elsewhere classified: Secondary | ICD-10-CM

## 2021-11-20 NOTE — Progress Notes (Signed)
History of Present Illness  There is no documented history at this time  Assessments & Plan   There are no diagnoses linked to this encounter.    Additional instructions  Subjective:  Patient presents with venous ulcer of the Left lower extremity.    Procedure:  3 layer unna wrap was placed Left lower extremity.   Plan:   Follow up in one week.  

## 2021-11-20 NOTE — Progress Notes (Signed)
   Follow-Up Visit   Subjective  [name] [name] is a 87 y.o. male who presents for the following: Other (Bleeding at [facility] site of right ear and mandible/).    The following portions of the chart were reviewed this encounter and updated as appropriate:       Review of Systems:  No other skin or systemic complaints except as noted in HPI or Assessment and Plan.  Objective  Well appearing patient in no apparent distress; mood and affect are within normal limits.  A focused examination was performed including face. Relevant physical exam findings are noted in the Assessment and Plan.  Right ear and mandible Bleeding at [facility] site    Assessment & Plan  Bleeding Right ear and mandible  1% lidocaine with epinephrine (1 cc) followed by aluminum chloride, mupirocin, gel foam and pressure dressing. Patient advised to leave pressure dressing in place for 2 days.   Return for Follow up as scheduled.

## 2021-11-23 DIAGNOSIS — I5023 Acute on chronic systolic (congestive) heart failure: Secondary | ICD-10-CM | POA: Diagnosis not present

## 2021-11-23 DIAGNOSIS — I110 Hypertensive heart disease with heart failure: Secondary | ICD-10-CM | POA: Diagnosis not present

## 2021-11-23 DIAGNOSIS — L97822 Non-pressure chronic ulcer of other part of left lower leg with fat layer exposed: Secondary | ICD-10-CM | POA: Diagnosis not present

## 2021-11-23 DIAGNOSIS — I70212 Atherosclerosis of native arteries of extremities with intermittent claudication, left leg: Secondary | ICD-10-CM | POA: Diagnosis not present

## 2021-11-23 DIAGNOSIS — I70222 Atherosclerosis of native arteries of extremities with rest pain, left leg: Secondary | ICD-10-CM | POA: Diagnosis not present

## 2021-11-23 DIAGNOSIS — I4891 Unspecified atrial fibrillation: Secondary | ICD-10-CM | POA: Diagnosis not present

## 2021-11-23 DIAGNOSIS — I2510 Atherosclerotic heart disease of native coronary artery without angina pectoris: Secondary | ICD-10-CM | POA: Diagnosis not present

## 2021-11-23 DIAGNOSIS — I872 Venous insufficiency (chronic) (peripheral): Secondary | ICD-10-CM | POA: Diagnosis not present

## 2021-11-23 DIAGNOSIS — I83028 Varicose veins of left lower extremity with ulcer other part of lower leg: Secondary | ICD-10-CM | POA: Diagnosis not present

## 2021-11-24 ENCOUNTER — Encounter (INDEPENDENT_AMBULATORY_CARE_PROVIDER_SITE_OTHER): Payer: Self-pay | Admitting: Nurse Practitioner

## 2021-11-24 NOTE — Progress Notes (Signed)
Subjective:    Patient ID: [name] [name], male    DOB: 07/15/1929, 87 y.o.   MRN: [id number] Chief Complaint  Patient presents with   Follow-up    4 wk Lt unna boot check    [name] [name] is a 87 year old male that presents today for evaluation of lower extremities following the unna wraps on the left lower extremity.  The patient notes that the swelling has been under good control there has been no weeping of his lower extremities and the wounds have resolved.  However he notes there is a small wound on his right lower extremity that has begun weeping.  He has been having his wraps done by home health.   Review of Systems  Cardiovascular:  Positive for leg swelling.  Skin:  Positive for wound.      Objective:   Physical Exam Vitals reviewed.  HENT:     Head: Normocephalic.  Cardiovascular:     Rate and Rhythm: Normal rate.  Pulmonary:     Effort: Pulmonary effort is normal.  Musculoskeletal:     Right lower leg: 1+ Edema present.     Left lower leg: 1+ Edema present.  Skin:    General: Skin is warm and dry.     Findings: Wound present.  Neurological:     Mental Status: He is alert and oriented to person, place, and time.  Psychiatric:        Mood and Affect: Mood normal.        Behavior: Behavior normal.        Thought Content: Thought content normal.        Judgment: Judgment normal.    BP 109/70   Pulse 64   Ht 5\' 8"  (1.727 m)   Wt 170 lb (77.1 kg)   BMI 25.85 kg/m   Past Medical History:  Diagnosis Date   A-fib ([place])    B12 deficiency    Coronary artery disease    HTN (hypertension), benign 04/25/2014   Iron deficiency anemia    Thyroid disease    Type 2 diabetes mellitus with peripheral angiopathy ([place]) 11/10/2018    Social History   Socioeconomic History   Marital status: Widowed    Spouse name: Not on file   Number of children: 2   Years of education: Not on file   Highest education level: Not on file  Occupational History   Not on file   Tobacco Use   Smoking status: Never   Smokeless tobacco: Never  Vaping Use   Vaping Use: Never used  Substance and Sexual Activity   Alcohol use: No   Drug use: No   Sexual activity: Not on file  Other Topics Concern   Not on file  Social History Narrative   Lives by himself: daughter penny lives in [place]. Daughter Mariann Laster lives in town   Social Determinants of Health   Financial Resource Strain: Not on file  Food Insecurity: Not on file  Transportation Needs: Not on file  Physical Activity: Not on file  Stress: Not on file  Social Connections: Not on file  Intimate Partner Violence: Not on file    Past Surgical History:  Procedure Laterality Date   CARDIAC SURGERY     CABG [place] Left 08/30/2021   Procedure: LOWER EXTREMITY ANGIOGRAPHY;  Surgeon: Katha Cabal, MD;  Location: [facility];  Service: Cardiovascular;  Laterality: Left;  Family History  Problem Relation Age of Onset   Hypertension Mother    Diabetes Brother    Stroke Brother    Heart attack Brother     Allergies  Allergen Reactions   Cephalexin Rash   Spironolactone Nausea Only   Codeine Rash and Other (See Comments)    Can not sleep   Doxycycline Rash    CBC Latest Ref Rng & Units 08/25/2021 02/05/2019 02/04/2015  WBC 4.0 - 10.5 K/uL 11.3(H) 10.5 9.7  Hemoglobin 13.0 - 17.0 g/dL 9.3(L) 11.6(L) 11.8(L)  Hematocrit 39.0 - 52.0 % 26.6(L) 33.8(L) 35.7(L)  Platelets 150 - 400 K/uL 209 152 167      CMP     Component Value Date/Time   NA 136 08/25/2021 1825   NA 141 02/04/2015 1001   K 3.8 08/25/2021 1825   K 4.2 02/04/2015 1001   CL 100 08/25/2021 1825   CL 106 02/04/2015 1001   CO2 28 08/25/2021 1825   CO2 30 02/04/2015 1001   GLUCOSE 125 (H) 08/25/2021 1825   GLUCOSE 122 (H) 02/04/2015 1001   BUN 24 (H) 08/27/2021 1240   BUN 19 (H) 02/04/2015 1001   CREATININE 1.60 (H) 08/27/2021 1240   CREATININE 1.26 02/04/2015 1001    CALCIUM 8.5 (L) 08/25/2021 1825   CALCIUM 8.5 02/04/2015 1001   PROT 7.5 02/05/2019 0121   PROT 7.2 02/04/2015 1001   ALBUMIN 4.4 02/05/2019 0121   ALBUMIN 3.5 02/04/2015 1001   AST 27 02/05/2019 0121   AST 28 02/04/2015 1001   ALT 21 02/05/2019 0121   ALT 20 02/04/2015 1001   ALKPHOS 51 02/05/2019 0121   ALKPHOS 79 02/04/2015 1001   BILITOT 1.1 02/05/2019 0121   BILITOT 0.6 02/04/2015 1001   GFRNONAA 41 (L) 08/27/2021 1240   GFRNONAA 58 (L) 02/04/2015 1001   GFRNONAA >60 11/06/2013 1304   GFRAA >60 02/05/2019 0121   GFRAA >60 02/04/2015 1001   GFRAA >60 11/06/2013 1304     No results found.     Assessment & Plan:   1. Lymphedema The patient notes that his left lower extremity is improved with no weeping or wounds and will like to transition into medical grade compression socks.  However he notes that the right leg has a wound with some weeping.  We will place the patient into Unna wraps on his right lower extremity.  He will continue with home health.  We will reevaluate in 4 weeks.  2. HTN (hypertension), benign Continue antihypertensive medications as already ordered, these medications have been reviewed and there are no changes at this time.   3. Mixed hyperlipidemia Continue statin as ordered and reviewed, no changes at this time    Current Outpatient Medications on File Prior to Visit  Medication Sig Dispense Refill   acetaminophen (TYLENOL) 500 MG tablet Take 1,000 mg by mouth every 6 (six) hours as needed for moderate pain.     apixaban (ELIQUIS) 5 MG TABS tablet Take 5 mg by mouth 2 (two) times daily.     aspirin EC 81 MG EC tablet Take 1 tablet (81 mg total) by mouth daily. Swallow whole. 30 tablet 11   carvedilol (COREG) 3.125 MG tablet Take by mouth.     cetirizine (ZYRTEC) 10 MG tablet Take 10 mg by mouth daily as needed for allergies.     Cyanocobalamin (VITAMIN B-12) 1000 MCG SUBL Take 1,000 mcg by mouth daily.     empagliflozin (JARDIANCE) 10 MG TABS  tablet Take 1  tablet by mouth daily.     ferrous sulfate 325 (65 FE) MG EC tablet Take 325 mg by mouth daily.     levothyroxine (SYNTHROID, LEVOTHROID) 112 MCG tablet Take 112 mcg by mouth daily.     metoprolol succinate (TOPROL-XL) 50 MG 24 hr tablet Take 50 mg by mouth daily.     Multiple Vitamins-Minerals (MULTIVITAMIN WITH MINERALS) tablet Take 1 tablet by mouth daily.     Omega-3 Fatty Acids (FISH OIL) 1000 MG CAPS Take 1,000 mg by mouth in the morning and at bedtime.     polyethylene glycol (MIRALAX / GLYCOLAX) packet Take 17 g by mouth daily. Mix one tablespoon with 8oz of your favorite juice or water every day until you are having soft formed stools. Then start taking once daily if you didn't have a stool the day before. 30 each 0   simvastatin (ZOCOR) 40 MG tablet Take 40 mg by mouth daily.     sulfamethoxazole-trimethoprim (BACTRIM) 400-80 MG tablet Take 1 tablet by mouth 2 (two) times daily. 14 tablet 0   torsemide (DEMADEX) 20 MG tablet Take 40 mg by mouth daily.     warfarin (COUMADIN) 5 MG tablet Take 5 mg by mouth at bedtime.     omeprazole (PRILOSEC) 20 MG capsule Take 20 mg by mouth daily.     No current facility-administered medications on file prior to visit.    There are no Patient Instructions on file for this visit. No follow-ups on file.   Kris Hartmann, NP

## 2021-11-25 ENCOUNTER — Telehealth (INDEPENDENT_AMBULATORY_CARE_PROVIDER_SITE_OTHER): Payer: Self-pay

## 2021-11-25 DIAGNOSIS — D509 Iron deficiency anemia, unspecified: Secondary | ICD-10-CM | POA: Diagnosis not present

## 2021-11-25 DIAGNOSIS — Z23 Encounter for immunization: Secondary | ICD-10-CM | POA: Diagnosis not present

## 2021-11-25 DIAGNOSIS — I5020 Unspecified systolic (congestive) heart failure: Secondary | ICD-10-CM | POA: Diagnosis not present

## 2021-11-25 DIAGNOSIS — E119 Type 2 diabetes mellitus without complications: Secondary | ICD-10-CM | POA: Diagnosis not present

## 2021-11-25 NOTE — Telephone Encounter (Signed)
Patient daughter left a voicemail stating that the home health needing orders to wrap both legs. Verbal orders were giving to [place] from [place] for weekly bilateral unna wraps.

## 2021-11-26 DIAGNOSIS — I2781 Cor pulmonale (chronic): Secondary | ICD-10-CM | POA: Diagnosis not present

## 2021-11-26 DIAGNOSIS — I5023 Acute on chronic systolic (congestive) heart failure: Secondary | ICD-10-CM | POA: Diagnosis not present

## 2021-11-26 DIAGNOSIS — I110 Hypertensive heart disease with heart failure: Secondary | ICD-10-CM | POA: Diagnosis not present

## 2021-11-26 DIAGNOSIS — I70212 Atherosclerosis of native arteries of extremities with intermittent claudication, left leg: Secondary | ICD-10-CM | POA: Diagnosis not present

## 2021-11-26 DIAGNOSIS — L97822 Non-pressure chronic ulcer of other part of left lower leg with fat layer exposed: Secondary | ICD-10-CM | POA: Diagnosis not present

## 2021-11-26 DIAGNOSIS — I2510 Atherosclerotic heart disease of native coronary artery without angina pectoris: Secondary | ICD-10-CM | POA: Diagnosis not present

## 2021-11-26 DIAGNOSIS — I4891 Unspecified atrial fibrillation: Secondary | ICD-10-CM | POA: Diagnosis not present

## 2021-11-26 DIAGNOSIS — I83028 Varicose veins of left lower extremity with ulcer other part of lower leg: Secondary | ICD-10-CM | POA: Diagnosis not present

## 2021-11-26 DIAGNOSIS — I70222 Atherosclerosis of native arteries of extremities with rest pain, left leg: Secondary | ICD-10-CM | POA: Diagnosis not present

## 2021-11-28 ENCOUNTER — Other Ambulatory Visit: Payer: Self-pay

## 2021-11-28 ENCOUNTER — Ambulatory Visit: Payer: Medicare HMO | Admitting: Dermatology

## 2021-11-28 DIAGNOSIS — Z85828 Personal history of other malignant neoplasm of skin: Secondary | ICD-10-CM

## 2021-11-28 DIAGNOSIS — R58 Hemorrhage, not elsewhere classified: Secondary | ICD-10-CM | POA: Diagnosis not present

## 2021-11-28 NOTE — Patient Instructions (Signed)

## 2021-11-28 NOTE — Progress Notes (Deleted)
   Follow-Up Visit   Subjective  [name] [name] is a 87 y.o. male who presents for the following: Follow-up (2 weeks f/u right ear and mandible SCC treated 11/18/21 ).    The following portions of the chart were reviewed this encounter and updated as appropriate:       Review of Systems:  No other skin or systemic complaints except as noted in HPI or Assessment and Plan.  Objective  Well appearing patient in no apparent distress; mood and affect are within normal limits.  A focused examination was performed including face, right ear. Relevant physical exam findings are noted in the Assessment and Plan.    Assessment & Plan   No follow-ups on file.  IMarye Round, CMA, am acting as scribe for Sarina Ser, MD .

## 2021-11-28 NOTE — Progress Notes (Signed)
   Follow-Up Visit   Subjective  [name] [name] is a 87 y.o. male who presents for the following: Follow-up (2 weeks f/u right ear and mandible SCC treated 11/18/21 ). Pt report this area was bleeding this morning and he could barely stop the bleeding.  The following portions of the chart were reviewed this encounter and updated as appropriate:   Tobacco  Allergies  Meds  Problems  Med Hx  Surg Hx  Fam Hx     Review of Systems:  No other skin or systemic complaints except as noted in HPI or Assessment and Plan.  Objective  Well appearing patient in no apparent distress; mood and affect are within normal limits.  A focused examination was performed including face,neck . Relevant physical exam findings are noted in the Assessment and Plan.  right ear and mandible Bleeding at [facility] site    Assessment & Plan  Bleeding right ear and mandible  Biopsy results discussed POORLY DIFFERENTIATED SQUAMOUS CELL CARCINOMA   Pt advised of pathology = Cancer - Poorly differentiated SCC Treated with EDC at time of shave removal.  High risk of recurrence for this, if any sign of recurrence return to the office. Pt understands in discussion today.  Electrocautery performed after anesthesia to bleeding area with success.  Return for as scheduled in February or sooner if needed .  IMarye Round, CMA, am acting as scribe for Sarina Ser, MD .  Documentation: I have reviewed the above documentation for accuracy and completeness, and I agree with the above.  Sarina Ser, MD

## 2021-11-30 DIAGNOSIS — I70212 Atherosclerosis of native arteries of extremities with intermittent claudication, left leg: Secondary | ICD-10-CM | POA: Diagnosis not present

## 2021-11-30 DIAGNOSIS — I70222 Atherosclerosis of native arteries of extremities with rest pain, left leg: Secondary | ICD-10-CM | POA: Diagnosis not present

## 2021-11-30 DIAGNOSIS — I2781 Cor pulmonale (chronic): Secondary | ICD-10-CM | POA: Diagnosis not present

## 2021-11-30 DIAGNOSIS — I110 Hypertensive heart disease with heart failure: Secondary | ICD-10-CM | POA: Diagnosis not present

## 2021-11-30 DIAGNOSIS — I4891 Unspecified atrial fibrillation: Secondary | ICD-10-CM | POA: Diagnosis not present

## 2021-11-30 DIAGNOSIS — L97822 Non-pressure chronic ulcer of other part of left lower leg with fat layer exposed: Secondary | ICD-10-CM | POA: Diagnosis not present

## 2021-11-30 DIAGNOSIS — I5023 Acute on chronic systolic (congestive) heart failure: Secondary | ICD-10-CM | POA: Diagnosis not present

## 2021-11-30 DIAGNOSIS — I83028 Varicose veins of left lower extremity with ulcer other part of lower leg: Secondary | ICD-10-CM | POA: Diagnosis not present

## 2021-11-30 DIAGNOSIS — I2510 Atherosclerotic heart disease of native coronary artery without angina pectoris: Secondary | ICD-10-CM | POA: Diagnosis not present

## 2021-12-02 ENCOUNTER — Encounter: Payer: Self-pay | Admitting: Dermatology

## 2021-12-03 DIAGNOSIS — I5023 Acute on chronic systolic (congestive) heart failure: Secondary | ICD-10-CM | POA: Diagnosis not present

## 2021-12-03 DIAGNOSIS — I70212 Atherosclerosis of native arteries of extremities with intermittent claudication, left leg: Secondary | ICD-10-CM | POA: Diagnosis not present

## 2021-12-03 DIAGNOSIS — I2510 Atherosclerotic heart disease of native coronary artery without angina pectoris: Secondary | ICD-10-CM | POA: Diagnosis not present

## 2021-12-03 DIAGNOSIS — L97822 Non-pressure chronic ulcer of other part of left lower leg with fat layer exposed: Secondary | ICD-10-CM | POA: Diagnosis not present

## 2021-12-03 DIAGNOSIS — I83028 Varicose veins of left lower extremity with ulcer other part of lower leg: Secondary | ICD-10-CM | POA: Diagnosis not present

## 2021-12-03 DIAGNOSIS — I110 Hypertensive heart disease with heart failure: Secondary | ICD-10-CM | POA: Diagnosis not present

## 2021-12-03 DIAGNOSIS — I2781 Cor pulmonale (chronic): Secondary | ICD-10-CM | POA: Diagnosis not present

## 2021-12-03 DIAGNOSIS — I70222 Atherosclerosis of native arteries of extremities with rest pain, left leg: Secondary | ICD-10-CM | POA: Diagnosis not present

## 2021-12-03 DIAGNOSIS — I4891 Unspecified atrial fibrillation: Secondary | ICD-10-CM | POA: Diagnosis not present

## 2021-12-06 ENCOUNTER — Encounter: Payer: Self-pay | Admitting: Dermatology

## 2021-12-06 DIAGNOSIS — I2781 Cor pulmonale (chronic): Secondary | ICD-10-CM | POA: Diagnosis not present

## 2021-12-06 DIAGNOSIS — L97822 Non-pressure chronic ulcer of other part of left lower leg with fat layer exposed: Secondary | ICD-10-CM | POA: Diagnosis not present

## 2021-12-06 DIAGNOSIS — I4891 Unspecified atrial fibrillation: Secondary | ICD-10-CM | POA: Diagnosis not present

## 2021-12-06 DIAGNOSIS — I70212 Atherosclerosis of native arteries of extremities with intermittent claudication, left leg: Secondary | ICD-10-CM | POA: Diagnosis not present

## 2021-12-06 DIAGNOSIS — I83028 Varicose veins of left lower extremity with ulcer other part of lower leg: Secondary | ICD-10-CM | POA: Diagnosis not present

## 2021-12-06 DIAGNOSIS — I2510 Atherosclerotic heart disease of native coronary artery without angina pectoris: Secondary | ICD-10-CM | POA: Diagnosis not present

## 2021-12-06 DIAGNOSIS — I110 Hypertensive heart disease with heart failure: Secondary | ICD-10-CM | POA: Diagnosis not present

## 2021-12-06 DIAGNOSIS — I70222 Atherosclerosis of native arteries of extremities with rest pain, left leg: Secondary | ICD-10-CM | POA: Diagnosis not present

## 2021-12-06 DIAGNOSIS — I5023 Acute on chronic systolic (congestive) heart failure: Secondary | ICD-10-CM | POA: Diagnosis not present

## 2021-12-10 DIAGNOSIS — I5023 Acute on chronic systolic (congestive) heart failure: Secondary | ICD-10-CM | POA: Diagnosis not present

## 2021-12-10 DIAGNOSIS — I2510 Atherosclerotic heart disease of native coronary artery without angina pectoris: Secondary | ICD-10-CM | POA: Diagnosis not present

## 2021-12-10 DIAGNOSIS — L97822 Non-pressure chronic ulcer of other part of left lower leg with fat layer exposed: Secondary | ICD-10-CM | POA: Diagnosis not present

## 2021-12-10 DIAGNOSIS — I4891 Unspecified atrial fibrillation: Secondary | ICD-10-CM | POA: Diagnosis not present

## 2021-12-10 DIAGNOSIS — I70222 Atherosclerosis of native arteries of extremities with rest pain, left leg: Secondary | ICD-10-CM | POA: Diagnosis not present

## 2021-12-10 DIAGNOSIS — I70212 Atherosclerosis of native arteries of extremities with intermittent claudication, left leg: Secondary | ICD-10-CM | POA: Diagnosis not present

## 2021-12-10 DIAGNOSIS — I110 Hypertensive heart disease with heart failure: Secondary | ICD-10-CM | POA: Diagnosis not present

## 2021-12-10 DIAGNOSIS — I83028 Varicose veins of left lower extremity with ulcer other part of lower leg: Secondary | ICD-10-CM | POA: Diagnosis not present

## 2021-12-10 DIAGNOSIS — I2781 Cor pulmonale (chronic): Secondary | ICD-10-CM | POA: Diagnosis not present

## 2021-12-13 DIAGNOSIS — L03115 Cellulitis of right lower limb: Secondary | ICD-10-CM | POA: Diagnosis not present

## 2021-12-13 DIAGNOSIS — I509 Heart failure, unspecified: Secondary | ICD-10-CM | POA: Diagnosis not present

## 2021-12-13 DIAGNOSIS — Z23 Encounter for immunization: Secondary | ICD-10-CM | POA: Diagnosis not present

## 2021-12-17 ENCOUNTER — Encounter (INDEPENDENT_AMBULATORY_CARE_PROVIDER_SITE_OTHER): Payer: Self-pay

## 2021-12-17 ENCOUNTER — Ambulatory Visit (INDEPENDENT_AMBULATORY_CARE_PROVIDER_SITE_OTHER): Payer: Medicare HMO | Admitting: Nurse Practitioner

## 2021-12-17 DIAGNOSIS — I2510 Atherosclerotic heart disease of native coronary artery without angina pectoris: Secondary | ICD-10-CM | POA: Diagnosis not present

## 2021-12-17 DIAGNOSIS — I70212 Atherosclerosis of native arteries of extremities with intermittent claudication, left leg: Secondary | ICD-10-CM | POA: Diagnosis not present

## 2021-12-17 DIAGNOSIS — I70222 Atherosclerosis of native arteries of extremities with rest pain, left leg: Secondary | ICD-10-CM | POA: Diagnosis not present

## 2021-12-17 DIAGNOSIS — L97822 Non-pressure chronic ulcer of other part of left lower leg with fat layer exposed: Secondary | ICD-10-CM | POA: Diagnosis not present

## 2021-12-17 DIAGNOSIS — I4891 Unspecified atrial fibrillation: Secondary | ICD-10-CM | POA: Diagnosis not present

## 2021-12-17 DIAGNOSIS — I2781 Cor pulmonale (chronic): Secondary | ICD-10-CM | POA: Diagnosis not present

## 2021-12-17 DIAGNOSIS — I5023 Acute on chronic systolic (congestive) heart failure: Secondary | ICD-10-CM | POA: Diagnosis not present

## 2021-12-17 DIAGNOSIS — I83028 Varicose veins of left lower extremity with ulcer other part of lower leg: Secondary | ICD-10-CM | POA: Diagnosis not present

## 2021-12-17 DIAGNOSIS — I110 Hypertensive heart disease with heart failure: Secondary | ICD-10-CM | POA: Diagnosis not present

## 2021-12-20 DIAGNOSIS — I83028 Varicose veins of left lower extremity with ulcer other part of lower leg: Secondary | ICD-10-CM | POA: Diagnosis not present

## 2021-12-20 DIAGNOSIS — I4891 Unspecified atrial fibrillation: Secondary | ICD-10-CM | POA: Diagnosis not present

## 2021-12-20 DIAGNOSIS — L97822 Non-pressure chronic ulcer of other part of left lower leg with fat layer exposed: Secondary | ICD-10-CM | POA: Diagnosis not present

## 2021-12-20 DIAGNOSIS — I2781 Cor pulmonale (chronic): Secondary | ICD-10-CM | POA: Diagnosis not present

## 2021-12-20 DIAGNOSIS — I110 Hypertensive heart disease with heart failure: Secondary | ICD-10-CM | POA: Diagnosis not present

## 2021-12-20 DIAGNOSIS — I2510 Atherosclerotic heart disease of native coronary artery without angina pectoris: Secondary | ICD-10-CM | POA: Diagnosis not present

## 2021-12-20 DIAGNOSIS — I70212 Atherosclerosis of native arteries of extremities with intermittent claudication, left leg: Secondary | ICD-10-CM | POA: Diagnosis not present

## 2021-12-20 DIAGNOSIS — I70222 Atherosclerosis of native arteries of extremities with rest pain, left leg: Secondary | ICD-10-CM | POA: Diagnosis not present

## 2021-12-20 DIAGNOSIS — I5023 Acute on chronic systolic (congestive) heart failure: Secondary | ICD-10-CM | POA: Diagnosis not present

## 2021-12-24 DIAGNOSIS — I5023 Acute on chronic systolic (congestive) heart failure: Secondary | ICD-10-CM | POA: Diagnosis not present

## 2021-12-24 DIAGNOSIS — L97822 Non-pressure chronic ulcer of other part of left lower leg with fat layer exposed: Secondary | ICD-10-CM | POA: Diagnosis not present

## 2021-12-24 DIAGNOSIS — I70222 Atherosclerosis of native arteries of extremities with rest pain, left leg: Secondary | ICD-10-CM | POA: Diagnosis not present

## 2021-12-24 DIAGNOSIS — I2510 Atherosclerotic heart disease of native coronary artery without angina pectoris: Secondary | ICD-10-CM | POA: Diagnosis not present

## 2021-12-24 DIAGNOSIS — I110 Hypertensive heart disease with heart failure: Secondary | ICD-10-CM | POA: Diagnosis not present

## 2021-12-24 DIAGNOSIS — I2781 Cor pulmonale (chronic): Secondary | ICD-10-CM | POA: Diagnosis not present

## 2021-12-24 DIAGNOSIS — I70212 Atherosclerosis of native arteries of extremities with intermittent claudication, left leg: Secondary | ICD-10-CM | POA: Diagnosis not present

## 2021-12-24 DIAGNOSIS — I4891 Unspecified atrial fibrillation: Secondary | ICD-10-CM | POA: Diagnosis not present

## 2021-12-24 DIAGNOSIS — I83028 Varicose veins of left lower extremity with ulcer other part of lower leg: Secondary | ICD-10-CM | POA: Diagnosis not present

## 2021-12-25 DIAGNOSIS — Z872 Personal history of diseases of the skin and subcutaneous tissue: Secondary | ICD-10-CM | POA: Diagnosis not present

## 2021-12-25 DIAGNOSIS — I5020 Unspecified systolic (congestive) heart failure: Secondary | ICD-10-CM | POA: Diagnosis not present

## 2021-12-26 ENCOUNTER — Ambulatory Visit (INDEPENDENT_AMBULATORY_CARE_PROVIDER_SITE_OTHER): Payer: Medicare HMO

## 2021-12-26 ENCOUNTER — Ambulatory Visit (INDEPENDENT_AMBULATORY_CARE_PROVIDER_SITE_OTHER): Payer: Medicare HMO | Admitting: Vascular Surgery

## 2021-12-26 ENCOUNTER — Other Ambulatory Visit (INDEPENDENT_AMBULATORY_CARE_PROVIDER_SITE_OTHER): Payer: Self-pay | Admitting: Nurse Practitioner

## 2021-12-26 DIAGNOSIS — Z9889 Other specified postprocedural states: Secondary | ICD-10-CM

## 2021-12-26 DIAGNOSIS — I739 Peripheral vascular disease, unspecified: Secondary | ICD-10-CM | POA: Diagnosis not present

## 2021-12-27 DIAGNOSIS — I83028 Varicose veins of left lower extremity with ulcer other part of lower leg: Secondary | ICD-10-CM | POA: Diagnosis not present

## 2021-12-27 DIAGNOSIS — I5023 Acute on chronic systolic (congestive) heart failure: Secondary | ICD-10-CM | POA: Diagnosis not present

## 2021-12-27 DIAGNOSIS — I4891 Unspecified atrial fibrillation: Secondary | ICD-10-CM | POA: Diagnosis not present

## 2021-12-27 DIAGNOSIS — I110 Hypertensive heart disease with heart failure: Secondary | ICD-10-CM | POA: Diagnosis not present

## 2021-12-27 DIAGNOSIS — I2510 Atherosclerotic heart disease of native coronary artery without angina pectoris: Secondary | ICD-10-CM | POA: Diagnosis not present

## 2021-12-27 DIAGNOSIS — I2781 Cor pulmonale (chronic): Secondary | ICD-10-CM | POA: Diagnosis not present

## 2021-12-27 DIAGNOSIS — I70212 Atherosclerosis of native arteries of extremities with intermittent claudication, left leg: Secondary | ICD-10-CM | POA: Diagnosis not present

## 2021-12-27 DIAGNOSIS — L97822 Non-pressure chronic ulcer of other part of left lower leg with fat layer exposed: Secondary | ICD-10-CM | POA: Diagnosis not present

## 2021-12-27 DIAGNOSIS — I70222 Atherosclerosis of native arteries of extremities with rest pain, left leg: Secondary | ICD-10-CM | POA: Diagnosis not present

## 2021-12-29 HISTORY — PX: OTHER SURGICAL HISTORY: SHX169

## 2021-12-31 DIAGNOSIS — I70212 Atherosclerosis of native arteries of extremities with intermittent claudication, left leg: Secondary | ICD-10-CM | POA: Diagnosis not present

## 2021-12-31 DIAGNOSIS — I4891 Unspecified atrial fibrillation: Secondary | ICD-10-CM | POA: Diagnosis not present

## 2021-12-31 DIAGNOSIS — I110 Hypertensive heart disease with heart failure: Secondary | ICD-10-CM | POA: Diagnosis not present

## 2021-12-31 DIAGNOSIS — L97822 Non-pressure chronic ulcer of other part of left lower leg with fat layer exposed: Secondary | ICD-10-CM | POA: Diagnosis not present

## 2021-12-31 DIAGNOSIS — I83028 Varicose veins of left lower extremity with ulcer other part of lower leg: Secondary | ICD-10-CM | POA: Diagnosis not present

## 2021-12-31 DIAGNOSIS — I5023 Acute on chronic systolic (congestive) heart failure: Secondary | ICD-10-CM | POA: Diagnosis not present

## 2021-12-31 DIAGNOSIS — I2781 Cor pulmonale (chronic): Secondary | ICD-10-CM | POA: Diagnosis not present

## 2021-12-31 DIAGNOSIS — I2510 Atherosclerotic heart disease of native coronary artery without angina pectoris: Secondary | ICD-10-CM | POA: Diagnosis not present

## 2021-12-31 DIAGNOSIS — I70222 Atherosclerosis of native arteries of extremities with rest pain, left leg: Secondary | ICD-10-CM | POA: Diagnosis not present

## 2022-01-02 ENCOUNTER — Encounter (INDEPENDENT_AMBULATORY_CARE_PROVIDER_SITE_OTHER): Payer: Self-pay | Admitting: *Deleted

## 2022-01-03 DIAGNOSIS — I110 Hypertensive heart disease with heart failure: Secondary | ICD-10-CM | POA: Diagnosis not present

## 2022-01-03 DIAGNOSIS — I4891 Unspecified atrial fibrillation: Secondary | ICD-10-CM | POA: Diagnosis not present

## 2022-01-03 DIAGNOSIS — I83028 Varicose veins of left lower extremity with ulcer other part of lower leg: Secondary | ICD-10-CM | POA: Diagnosis not present

## 2022-01-03 DIAGNOSIS — I70222 Atherosclerosis of native arteries of extremities with rest pain, left leg: Secondary | ICD-10-CM | POA: Diagnosis not present

## 2022-01-03 DIAGNOSIS — I5023 Acute on chronic systolic (congestive) heart failure: Secondary | ICD-10-CM | POA: Diagnosis not present

## 2022-01-03 DIAGNOSIS — L97822 Non-pressure chronic ulcer of other part of left lower leg with fat layer exposed: Secondary | ICD-10-CM | POA: Diagnosis not present

## 2022-01-03 DIAGNOSIS — I2781 Cor pulmonale (chronic): Secondary | ICD-10-CM | POA: Diagnosis not present

## 2022-01-03 DIAGNOSIS — I2510 Atherosclerotic heart disease of native coronary artery without angina pectoris: Secondary | ICD-10-CM | POA: Diagnosis not present

## 2022-01-03 DIAGNOSIS — I70212 Atherosclerosis of native arteries of extremities with intermittent claudication, left leg: Secondary | ICD-10-CM | POA: Diagnosis not present

## 2022-01-06 DIAGNOSIS — I83028 Varicose veins of left lower extremity with ulcer other part of lower leg: Secondary | ICD-10-CM | POA: Diagnosis not present

## 2022-01-06 DIAGNOSIS — L97822 Non-pressure chronic ulcer of other part of left lower leg with fat layer exposed: Secondary | ICD-10-CM | POA: Diagnosis not present

## 2022-01-06 DIAGNOSIS — I110 Hypertensive heart disease with heart failure: Secondary | ICD-10-CM | POA: Diagnosis not present

## 2022-01-06 DIAGNOSIS — I2781 Cor pulmonale (chronic): Secondary | ICD-10-CM | POA: Diagnosis not present

## 2022-01-06 DIAGNOSIS — I70212 Atherosclerosis of native arteries of extremities with intermittent claudication, left leg: Secondary | ICD-10-CM | POA: Diagnosis not present

## 2022-01-06 DIAGNOSIS — I4891 Unspecified atrial fibrillation: Secondary | ICD-10-CM | POA: Diagnosis not present

## 2022-01-06 DIAGNOSIS — I5023 Acute on chronic systolic (congestive) heart failure: Secondary | ICD-10-CM | POA: Diagnosis not present

## 2022-01-06 DIAGNOSIS — I70222 Atherosclerosis of native arteries of extremities with rest pain, left leg: Secondary | ICD-10-CM | POA: Diagnosis not present

## 2022-01-06 DIAGNOSIS — I2510 Atherosclerotic heart disease of native coronary artery without angina pectoris: Secondary | ICD-10-CM | POA: Diagnosis not present

## 2022-01-08 DIAGNOSIS — I4891 Unspecified atrial fibrillation: Secondary | ICD-10-CM | POA: Diagnosis not present

## 2022-01-08 DIAGNOSIS — I4892 Unspecified atrial flutter: Secondary | ICD-10-CM | POA: Diagnosis not present

## 2022-01-08 DIAGNOSIS — R21 Rash and other nonspecific skin eruption: Secondary | ICD-10-CM | POA: Diagnosis not present

## 2022-01-08 DIAGNOSIS — I872 Venous insufficiency (chronic) (peripheral): Secondary | ICD-10-CM | POA: Diagnosis not present

## 2022-01-11 DIAGNOSIS — I2781 Cor pulmonale (chronic): Secondary | ICD-10-CM | POA: Diagnosis not present

## 2022-01-11 DIAGNOSIS — I2510 Atherosclerotic heart disease of native coronary artery without angina pectoris: Secondary | ICD-10-CM | POA: Diagnosis not present

## 2022-01-11 DIAGNOSIS — I83028 Varicose veins of left lower extremity with ulcer other part of lower leg: Secondary | ICD-10-CM | POA: Diagnosis not present

## 2022-01-11 DIAGNOSIS — I4891 Unspecified atrial fibrillation: Secondary | ICD-10-CM | POA: Diagnosis not present

## 2022-01-11 DIAGNOSIS — I5023 Acute on chronic systolic (congestive) heart failure: Secondary | ICD-10-CM | POA: Diagnosis not present

## 2022-01-11 DIAGNOSIS — I70212 Atherosclerosis of native arteries of extremities with intermittent claudication, left leg: Secondary | ICD-10-CM | POA: Diagnosis not present

## 2022-01-11 DIAGNOSIS — L97822 Non-pressure chronic ulcer of other part of left lower leg with fat layer exposed: Secondary | ICD-10-CM | POA: Diagnosis not present

## 2022-01-11 DIAGNOSIS — I110 Hypertensive heart disease with heart failure: Secondary | ICD-10-CM | POA: Diagnosis not present

## 2022-01-11 DIAGNOSIS — I70222 Atherosclerosis of native arteries of extremities with rest pain, left leg: Secondary | ICD-10-CM | POA: Diagnosis not present

## 2022-01-13 DIAGNOSIS — I2781 Cor pulmonale (chronic): Secondary | ICD-10-CM | POA: Diagnosis not present

## 2022-01-13 DIAGNOSIS — I70212 Atherosclerosis of native arteries of extremities with intermittent claudication, left leg: Secondary | ICD-10-CM | POA: Diagnosis not present

## 2022-01-13 DIAGNOSIS — I70222 Atherosclerosis of native arteries of extremities with rest pain, left leg: Secondary | ICD-10-CM | POA: Diagnosis not present

## 2022-01-13 DIAGNOSIS — I2510 Atherosclerotic heart disease of native coronary artery without angina pectoris: Secondary | ICD-10-CM | POA: Diagnosis not present

## 2022-01-13 DIAGNOSIS — I4891 Unspecified atrial fibrillation: Secondary | ICD-10-CM | POA: Diagnosis not present

## 2022-01-13 DIAGNOSIS — I5023 Acute on chronic systolic (congestive) heart failure: Secondary | ICD-10-CM | POA: Diagnosis not present

## 2022-01-13 DIAGNOSIS — I110 Hypertensive heart disease with heart failure: Secondary | ICD-10-CM | POA: Diagnosis not present

## 2022-01-13 DIAGNOSIS — I83028 Varicose veins of left lower extremity with ulcer other part of lower leg: Secondary | ICD-10-CM | POA: Diagnosis not present

## 2022-01-13 DIAGNOSIS — L97822 Non-pressure chronic ulcer of other part of left lower leg with fat layer exposed: Secondary | ICD-10-CM | POA: Diagnosis not present

## 2022-01-15 DIAGNOSIS — I509 Heart failure, unspecified: Secondary | ICD-10-CM | POA: Diagnosis not present

## 2022-01-15 DIAGNOSIS — I2781 Cor pulmonale (chronic): Secondary | ICD-10-CM | POA: Diagnosis not present

## 2022-01-15 DIAGNOSIS — Z23 Encounter for immunization: Secondary | ICD-10-CM | POA: Diagnosis not present

## 2022-01-15 DIAGNOSIS — Z0000 Encounter for general adult medical examination without abnormal findings: Secondary | ICD-10-CM | POA: Diagnosis not present

## 2022-01-15 DIAGNOSIS — E1151 Type 2 diabetes mellitus with diabetic peripheral angiopathy without gangrene: Secondary | ICD-10-CM | POA: Diagnosis not present

## 2022-01-15 DIAGNOSIS — I4891 Unspecified atrial fibrillation: Secondary | ICD-10-CM | POA: Diagnosis not present

## 2022-01-15 DIAGNOSIS — I429 Cardiomyopathy, unspecified: Secondary | ICD-10-CM | POA: Diagnosis not present

## 2022-01-18 DIAGNOSIS — I110 Hypertensive heart disease with heart failure: Secondary | ICD-10-CM | POA: Diagnosis not present

## 2022-01-18 DIAGNOSIS — I83028 Varicose veins of left lower extremity with ulcer other part of lower leg: Secondary | ICD-10-CM | POA: Diagnosis not present

## 2022-01-18 DIAGNOSIS — I2510 Atherosclerotic heart disease of native coronary artery without angina pectoris: Secondary | ICD-10-CM | POA: Diagnosis not present

## 2022-01-18 DIAGNOSIS — I70222 Atherosclerosis of native arteries of extremities with rest pain, left leg: Secondary | ICD-10-CM | POA: Diagnosis not present

## 2022-01-18 DIAGNOSIS — L97822 Non-pressure chronic ulcer of other part of left lower leg with fat layer exposed: Secondary | ICD-10-CM | POA: Diagnosis not present

## 2022-01-18 DIAGNOSIS — I70212 Atherosclerosis of native arteries of extremities with intermittent claudication, left leg: Secondary | ICD-10-CM | POA: Diagnosis not present

## 2022-01-18 DIAGNOSIS — I5023 Acute on chronic systolic (congestive) heart failure: Secondary | ICD-10-CM | POA: Diagnosis not present

## 2022-01-18 DIAGNOSIS — I2781 Cor pulmonale (chronic): Secondary | ICD-10-CM | POA: Diagnosis not present

## 2022-01-18 DIAGNOSIS — I4891 Unspecified atrial fibrillation: Secondary | ICD-10-CM | POA: Diagnosis not present

## 2022-01-22 DIAGNOSIS — I110 Hypertensive heart disease with heart failure: Secondary | ICD-10-CM | POA: Diagnosis not present

## 2022-01-22 DIAGNOSIS — I70212 Atherosclerosis of native arteries of extremities with intermittent claudication, left leg: Secondary | ICD-10-CM | POA: Diagnosis not present

## 2022-01-22 DIAGNOSIS — I70222 Atherosclerosis of native arteries of extremities with rest pain, left leg: Secondary | ICD-10-CM | POA: Diagnosis not present

## 2022-01-22 DIAGNOSIS — I4891 Unspecified atrial fibrillation: Secondary | ICD-10-CM | POA: Diagnosis not present

## 2022-01-22 DIAGNOSIS — I2781 Cor pulmonale (chronic): Secondary | ICD-10-CM | POA: Diagnosis not present

## 2022-01-22 DIAGNOSIS — L97822 Non-pressure chronic ulcer of other part of left lower leg with fat layer exposed: Secondary | ICD-10-CM | POA: Diagnosis not present

## 2022-01-22 DIAGNOSIS — I2510 Atherosclerotic heart disease of native coronary artery without angina pectoris: Secondary | ICD-10-CM | POA: Diagnosis not present

## 2022-01-22 DIAGNOSIS — I5023 Acute on chronic systolic (congestive) heart failure: Secondary | ICD-10-CM | POA: Diagnosis not present

## 2022-01-22 DIAGNOSIS — I83028 Varicose veins of left lower extremity with ulcer other part of lower leg: Secondary | ICD-10-CM | POA: Diagnosis not present

## 2022-01-25 DIAGNOSIS — I5023 Acute on chronic systolic (congestive) heart failure: Secondary | ICD-10-CM | POA: Diagnosis not present

## 2022-01-25 DIAGNOSIS — L97822 Non-pressure chronic ulcer of other part of left lower leg with fat layer exposed: Secondary | ICD-10-CM | POA: Diagnosis not present

## 2022-01-25 DIAGNOSIS — I83028 Varicose veins of left lower extremity with ulcer other part of lower leg: Secondary | ICD-10-CM | POA: Diagnosis not present

## 2022-01-25 DIAGNOSIS — L97819 Non-pressure chronic ulcer of other part of right lower leg with unspecified severity: Secondary | ICD-10-CM | POA: Diagnosis not present

## 2022-01-25 DIAGNOSIS — I4891 Unspecified atrial fibrillation: Secondary | ICD-10-CM | POA: Diagnosis not present

## 2022-01-25 DIAGNOSIS — I70222 Atherosclerosis of native arteries of extremities with rest pain, left leg: Secondary | ICD-10-CM | POA: Diagnosis not present

## 2022-01-25 DIAGNOSIS — I110 Hypertensive heart disease with heart failure: Secondary | ICD-10-CM | POA: Diagnosis not present

## 2022-01-25 DIAGNOSIS — I70212 Atherosclerosis of native arteries of extremities with intermittent claudication, left leg: Secondary | ICD-10-CM | POA: Diagnosis not present

## 2022-01-25 DIAGNOSIS — I2510 Atherosclerotic heart disease of native coronary artery without angina pectoris: Secondary | ICD-10-CM | POA: Diagnosis not present

## 2022-01-29 DIAGNOSIS — I110 Hypertensive heart disease with heart failure: Secondary | ICD-10-CM | POA: Diagnosis not present

## 2022-01-29 DIAGNOSIS — I83028 Varicose veins of left lower extremity with ulcer other part of lower leg: Secondary | ICD-10-CM | POA: Diagnosis not present

## 2022-01-29 DIAGNOSIS — I4891 Unspecified atrial fibrillation: Secondary | ICD-10-CM | POA: Diagnosis not present

## 2022-01-29 DIAGNOSIS — L97819 Non-pressure chronic ulcer of other part of right lower leg with unspecified severity: Secondary | ICD-10-CM | POA: Diagnosis not present

## 2022-01-29 DIAGNOSIS — I5023 Acute on chronic systolic (congestive) heart failure: Secondary | ICD-10-CM | POA: Diagnosis not present

## 2022-01-29 DIAGNOSIS — I70212 Atherosclerosis of native arteries of extremities with intermittent claudication, left leg: Secondary | ICD-10-CM | POA: Diagnosis not present

## 2022-01-29 DIAGNOSIS — L97822 Non-pressure chronic ulcer of other part of left lower leg with fat layer exposed: Secondary | ICD-10-CM | POA: Diagnosis not present

## 2022-01-29 DIAGNOSIS — I70222 Atherosclerosis of native arteries of extremities with rest pain, left leg: Secondary | ICD-10-CM | POA: Diagnosis not present

## 2022-01-29 DIAGNOSIS — I2510 Atherosclerotic heart disease of native coronary artery without angina pectoris: Secondary | ICD-10-CM | POA: Diagnosis not present

## 2022-02-01 DIAGNOSIS — I110 Hypertensive heart disease with heart failure: Secondary | ICD-10-CM | POA: Diagnosis not present

## 2022-02-01 DIAGNOSIS — L97822 Non-pressure chronic ulcer of other part of left lower leg with fat layer exposed: Secondary | ICD-10-CM | POA: Diagnosis not present

## 2022-02-01 DIAGNOSIS — I83028 Varicose veins of left lower extremity with ulcer other part of lower leg: Secondary | ICD-10-CM | POA: Diagnosis not present

## 2022-02-01 DIAGNOSIS — L97819 Non-pressure chronic ulcer of other part of right lower leg with unspecified severity: Secondary | ICD-10-CM | POA: Diagnosis not present

## 2022-02-01 DIAGNOSIS — I70222 Atherosclerosis of native arteries of extremities with rest pain, left leg: Secondary | ICD-10-CM | POA: Diagnosis not present

## 2022-02-01 DIAGNOSIS — I5023 Acute on chronic systolic (congestive) heart failure: Secondary | ICD-10-CM | POA: Diagnosis not present

## 2022-02-01 DIAGNOSIS — I2510 Atherosclerotic heart disease of native coronary artery without angina pectoris: Secondary | ICD-10-CM | POA: Diagnosis not present

## 2022-02-01 DIAGNOSIS — I4891 Unspecified atrial fibrillation: Secondary | ICD-10-CM | POA: Diagnosis not present

## 2022-02-01 DIAGNOSIS — I70212 Atherosclerosis of native arteries of extremities with intermittent claudication, left leg: Secondary | ICD-10-CM | POA: Diagnosis not present

## 2022-02-04 DIAGNOSIS — I110 Hypertensive heart disease with heart failure: Secondary | ICD-10-CM | POA: Diagnosis not present

## 2022-02-04 DIAGNOSIS — L97822 Non-pressure chronic ulcer of other part of left lower leg with fat layer exposed: Secondary | ICD-10-CM | POA: Diagnosis not present

## 2022-02-04 DIAGNOSIS — I70222 Atherosclerosis of native arteries of extremities with rest pain, left leg: Secondary | ICD-10-CM | POA: Diagnosis not present

## 2022-02-04 DIAGNOSIS — I83028 Varicose veins of left lower extremity with ulcer other part of lower leg: Secondary | ICD-10-CM | POA: Diagnosis not present

## 2022-02-04 DIAGNOSIS — I2510 Atherosclerotic heart disease of native coronary artery without angina pectoris: Secondary | ICD-10-CM | POA: Diagnosis not present

## 2022-02-04 DIAGNOSIS — I4891 Unspecified atrial fibrillation: Secondary | ICD-10-CM | POA: Diagnosis not present

## 2022-02-04 DIAGNOSIS — I70212 Atherosclerosis of native arteries of extremities with intermittent claudication, left leg: Secondary | ICD-10-CM | POA: Diagnosis not present

## 2022-02-04 DIAGNOSIS — I5023 Acute on chronic systolic (congestive) heart failure: Secondary | ICD-10-CM | POA: Diagnosis not present

## 2022-02-04 DIAGNOSIS — L97819 Non-pressure chronic ulcer of other part of right lower leg with unspecified severity: Secondary | ICD-10-CM | POA: Diagnosis not present

## 2022-02-07 DIAGNOSIS — L97819 Non-pressure chronic ulcer of other part of right lower leg with unspecified severity: Secondary | ICD-10-CM | POA: Diagnosis not present

## 2022-02-07 DIAGNOSIS — I70222 Atherosclerosis of native arteries of extremities with rest pain, left leg: Secondary | ICD-10-CM | POA: Diagnosis not present

## 2022-02-07 DIAGNOSIS — I5023 Acute on chronic systolic (congestive) heart failure: Secondary | ICD-10-CM | POA: Diagnosis not present

## 2022-02-07 DIAGNOSIS — I4891 Unspecified atrial fibrillation: Secondary | ICD-10-CM | POA: Diagnosis not present

## 2022-02-07 DIAGNOSIS — L97822 Non-pressure chronic ulcer of other part of left lower leg with fat layer exposed: Secondary | ICD-10-CM | POA: Diagnosis not present

## 2022-02-07 DIAGNOSIS — I70212 Atherosclerosis of native arteries of extremities with intermittent claudication, left leg: Secondary | ICD-10-CM | POA: Diagnosis not present

## 2022-02-07 DIAGNOSIS — I2510 Atherosclerotic heart disease of native coronary artery without angina pectoris: Secondary | ICD-10-CM | POA: Diagnosis not present

## 2022-02-07 DIAGNOSIS — I110 Hypertensive heart disease with heart failure: Secondary | ICD-10-CM | POA: Diagnosis not present

## 2022-02-07 DIAGNOSIS — I83028 Varicose veins of left lower extremity with ulcer other part of lower leg: Secondary | ICD-10-CM | POA: Diagnosis not present

## 2022-02-10 DIAGNOSIS — I110 Hypertensive heart disease with heart failure: Secondary | ICD-10-CM | POA: Diagnosis not present

## 2022-02-10 DIAGNOSIS — I2510 Atherosclerotic heart disease of native coronary artery without angina pectoris: Secondary | ICD-10-CM | POA: Diagnosis not present

## 2022-02-10 DIAGNOSIS — I83028 Varicose veins of left lower extremity with ulcer other part of lower leg: Secondary | ICD-10-CM | POA: Diagnosis not present

## 2022-02-10 DIAGNOSIS — L97819 Non-pressure chronic ulcer of other part of right lower leg with unspecified severity: Secondary | ICD-10-CM | POA: Diagnosis not present

## 2022-02-10 DIAGNOSIS — I70222 Atherosclerosis of native arteries of extremities with rest pain, left leg: Secondary | ICD-10-CM | POA: Diagnosis not present

## 2022-02-10 DIAGNOSIS — L97822 Non-pressure chronic ulcer of other part of left lower leg with fat layer exposed: Secondary | ICD-10-CM | POA: Diagnosis not present

## 2022-02-10 DIAGNOSIS — I70212 Atherosclerosis of native arteries of extremities with intermittent claudication, left leg: Secondary | ICD-10-CM | POA: Diagnosis not present

## 2022-02-10 DIAGNOSIS — I5023 Acute on chronic systolic (congestive) heart failure: Secondary | ICD-10-CM | POA: Diagnosis not present

## 2022-02-10 DIAGNOSIS — I4891 Unspecified atrial fibrillation: Secondary | ICD-10-CM | POA: Diagnosis not present

## 2022-02-14 DIAGNOSIS — E119 Type 2 diabetes mellitus without complications: Secondary | ICD-10-CM | POA: Diagnosis not present

## 2022-02-14 DIAGNOSIS — L03115 Cellulitis of right lower limb: Secondary | ICD-10-CM | POA: Diagnosis not present

## 2022-02-14 DIAGNOSIS — I429 Cardiomyopathy, unspecified: Secondary | ICD-10-CM | POA: Diagnosis not present

## 2022-02-14 DIAGNOSIS — I509 Heart failure, unspecified: Secondary | ICD-10-CM | POA: Diagnosis not present

## 2022-02-16 ENCOUNTER — Emergency Department
Admission: EM | Admit: 2022-02-16 | Discharge: 2022-02-16 | Disposition: A | Discharge status: Home / Self Care | Payer: Medicare HMO | Attending: Emergency Medicine | Admitting: Emergency Medicine

## 2022-02-16 ENCOUNTER — Encounter: Payer: Self-pay | Admitting: Emergency Medicine

## 2022-02-16 ENCOUNTER — Emergency Department: Payer: Medicare HMO

## 2022-02-16 ENCOUNTER — Other Ambulatory Visit: Payer: Self-pay

## 2022-02-16 DIAGNOSIS — R0789 Other chest pain: Secondary | ICD-10-CM | POA: Diagnosis not present

## 2022-02-16 DIAGNOSIS — E119 Type 2 diabetes mellitus without complications: Secondary | ICD-10-CM | POA: Diagnosis not present

## 2022-02-16 DIAGNOSIS — I509 Heart failure, unspecified: Secondary | ICD-10-CM | POA: Insufficient documentation

## 2022-02-16 DIAGNOSIS — I517 Cardiomegaly: Secondary | ICD-10-CM | POA: Diagnosis not present

## 2022-02-16 DIAGNOSIS — R42 Dizziness and giddiness: Secondary | ICD-10-CM | POA: Diagnosis not present

## 2022-02-16 DIAGNOSIS — E876 Hypokalemia: Secondary | ICD-10-CM | POA: Diagnosis not present

## 2022-02-16 DIAGNOSIS — Z951 Presence of aortocoronary bypass graft: Secondary | ICD-10-CM | POA: Insufficient documentation

## 2022-02-16 DIAGNOSIS — I110 Hypertensive heart disease with heart failure: Secondary | ICD-10-CM | POA: Insufficient documentation

## 2022-02-16 DIAGNOSIS — R079 Chest pain, unspecified: Secondary | ICD-10-CM | POA: Diagnosis not present

## 2022-02-16 LAB — CBC
HCT: 28.7 % — ABNORMAL LOW (ref 39.0–52.0)
Hemoglobin: 9.5 g/dL — ABNORMAL LOW (ref 13.0–17.0)
MCH: 33.6 pg (ref 26.0–34.0)
MCHC: 33.1 g/dL (ref 30.0–36.0)
MCV: 101.4 fL — ABNORMAL HIGH (ref 80.0–100.0)
Platelets: 244 10*3/uL (ref 150–400)
RBC: 2.83 MIL/uL — ABNORMAL LOW (ref 4.22–5.81)
RDW: 20.2 % — ABNORMAL HIGH (ref 11.5–15.5)
WBC: 11.2 10*3/uL — ABNORMAL HIGH (ref 4.0–10.5)
nRBC: 0 % (ref 0.0–0.2)

## 2022-02-16 LAB — HEPATIC FUNCTION PANEL
ALT: 15 U/L (ref 0–44)
AST: 29 U/L (ref 15–41)
Albumin: 3.5 g/dL (ref 3.5–5.0)
Alkaline Phosphatase: 53 U/L (ref 38–126)
Bilirubin, Direct: 0.1 mg/dL (ref 0.0–0.2)
Indirect Bilirubin: 0.5 mg/dL (ref 0.3–0.9)
Total Bilirubin: 0.6 mg/dL (ref 0.3–1.2)
Total Protein: 7.7 g/dL (ref 6.5–8.1)

## 2022-02-16 LAB — TROPONIN I (HIGH SENSITIVITY)
Troponin I (High Sensitivity): 29 ng/L — ABNORMAL HIGH (ref <18)
Troponin I (High Sensitivity): 29 ng/L — ABNORMAL HIGH (ref <18)

## 2022-02-16 LAB — CBG MONITORING, ED: Glucose-Capillary: 101 mg/dL — ABNORMAL HIGH (ref 70–99)

## 2022-02-16 LAB — BASIC METABOLIC PANEL
Anion gap: 12 (ref 5–15)
BUN: 41 mg/dL — ABNORMAL HIGH (ref 8–23)
CO2: 35 mmol/L — ABNORMAL HIGH (ref 22–32)
Calcium: 9.4 mg/dL (ref 8.9–10.3)
Chloride: 91 mmol/L — ABNORMAL LOW (ref 98–111)
Creatinine, Ser: 1.24 mg/dL (ref 0.61–1.24)
GFR, Estimated: 55 mL/min — ABNORMAL LOW (ref >60)
Glucose, Bld: 58 mg/dL — ABNORMAL LOW (ref 70–99)
Potassium: 3.1 mmol/L — ABNORMAL LOW (ref 3.5–5.1)
Sodium: 138 mmol/L (ref 135–145)

## 2022-02-16 LAB — LIPASE, BLOOD: Lipase: 29 U/L (ref 11–51)

## 2022-02-16 LAB — BRAIN NATRIURETIC PEPTIDE: B Natriuretic Peptide: 534.7 pg/mL — ABNORMAL HIGH (ref 0.0–100.0)

## 2022-02-16 MED ORDER — POTASSIUM CHLORIDE CRYS ER 20 MEQ PO TBCR
40.0000 meq | EXTENDED_RELEASE_TABLET | Freq: Once | ORAL | Status: AC
  Administered 2022-02-16: 40 meq via ORAL
  Filled 2022-02-16: qty 2

## 2022-02-16 MED ORDER — TRAMADOL HCL 50 MG PO TABS
50.0000 mg | ORAL_TABLET | Freq: Once | ORAL | Status: AC
  Administered 2022-02-16: 50 mg via ORAL
  Filled 2022-02-16: qty 1

## 2022-02-16 MED ORDER — ACETAMINOPHEN 500 MG PO TABS
1000.0000 mg | ORAL_TABLET | Freq: Once | ORAL | Status: AC
  Administered 2022-02-16: 1000 mg via ORAL
  Filled 2022-02-16: qty 2

## 2022-02-16 MED ORDER — POTASSIUM CHLORIDE 10 MEQ/100ML IV SOLN
10.0000 meq | Freq: Once | INTRAVENOUS | Status: AC
  Administered 2022-02-16: 10 meq via INTRAVENOUS
  Filled 2022-02-16: qty 100

## 2022-02-16 NOTE — ED Provider Notes (Signed)
----------------------------------------- °  5:24 PM on 02/16/2022 -----------------------------------------  I took over care of this patient from Dr. Jari Pigg.  Repeat troponin is unchanged.  Glucose is stable.  The patient has had no recurrent chest pain or near syncope.  Based on the initial plan discussed with Dr. Jari Pigg, we will discharge.  He does report some foot pain which is an exacerbation of chronic pain and related to lying in the stretcher.  I will give a tramadol for this.  I counseled the patient on the results of the work-up and plan of care.  He will follow-up with his cardiologist.  Return precautions given, and he expresses understanding.   Arta Silence, MD 02/16/22 1725

## 2022-02-16 NOTE — ED Triage Notes (Signed)
Pt to ED via POV with c/o chest pain, he is having dizziness with it, but denies any N/V, SOB or diaphoresis. It began this am he had EMS out to the house and they cleared him, he went to [facility] and they sent him here to be evaluated.

## 2022-02-16 NOTE — ED Provider Notes (Signed)
[facility] Provider Note    Event Date/Time   First MD Initiated Contact with Patient 02/16/22 1354     (approximate)   History   Chest Pain   HPI  [name] [name] is a 88 y.o. male with history of bypass, CHF, A-fib, hypertension, diabetes who comes in with concerns for chest pain.  Patient reports having intermittent stabbing the like chest pain that started overnight, 3 episodes.  He states it last first few seconds and then goes away.  Denies ever having this previously but he wanted to make sure that it was not related to his heart.  He denies any chest pain at this time or shortness of breath.  He reports a little bit of dizziness when it comes on but then no continued dizziness at this time.  He does report that he is getting treatment for his legs with some cellulitis which they placed him on clindamycin for and his legs are wrapped.  He denies any issues with this and the daughter was able to show me a picture of the legs underneath but states that it was not getting any worse.  He is on Jardiance that was started for his diabetes.    Physical Exam   Triage Vital Signs: ED Triage Vitals  Enc Vitals Group     BP 02/16/22 1322 121/69     Pulse Rate 02/16/22 1322 63     Resp 02/16/22 1322 18     Temp 02/16/22 1322 98.3 F (36.8 C)     Temp Source 02/16/22 1322 Oral     SpO2 02/16/22 1322 98 %     Weight 02/16/22 1323 150 lb (68 kg)     Height 02/16/22 1323 5\' 8"  (1.727 m)     Head Circumference --      Peak Flow --      Pain Score 02/16/22 1323 8     Pain Loc --      Pain Edu? --      Excl. in [place]? --     Most recent vital signs: Vitals:   02/16/22 1322  BP: 121/69  Pulse: 63  Resp: 18  Temp: 98.3 F (36.8 C)  SpO2: 98%     General: Awake, no distress.  CV:  Good peripheral perfusion.  No chest wall tenderness Resp:  Normal effort.  Abd:  No distention.  Soft and nontender Other:  Patient has bilateral legs that are in wraps  that he declines to be taking down but the daughter is able to show me some pictures of the cellulitis that he is currently being treated but he denies any worsening redness and was discharged on the antibiotics.  His feet are warm and well-perfused and are normal in color.   ED Results / Procedures / Treatments   Labs (all labs ordered are listed, but only abnormal results are displayed) Labs Reviewed  BASIC METABOLIC PANEL - Abnormal; Notable for the following components:      Result Value   Potassium 3.1 (*)    Chloride 91 (*)    CO2 35 (*)    Glucose, Bld 58 (*)    BUN 41 (*)    GFR, Estimated 55 (*)    All other components within normal limits  CBC - Abnormal; Notable for the following components:   WBC 11.2 (*)    RBC 2.83 (*)    Hemoglobin 9.5 (*)    HCT 28.7 (*)    MCV  101.4 (*)    RDW 20.2 (*)    All other components within normal limits  BRAIN NATRIURETIC PEPTIDE - Abnormal; Notable for the following components:   B Natriuretic Peptide 534.7 (*)    All other components within normal limits  TROPONIN I (HIGH SENSITIVITY) - Abnormal; Notable for the following components:   Troponin I (High Sensitivity) 29 (*)    All other components within normal limits  HEPATIC FUNCTION PANEL  LIPASE, BLOOD  TROPONIN I (HIGH SENSITIVITY)     EKG  My interpretation of EKG:  A-fib rate of 67 without any ST elevation or T wave inversions, normal intervals  RADIOLOGY I have reviewed the xray personallyand no PNA. Maybe atelctasis  IMPRESSION: 1. Mild bibasilar opacities, LEFT greater than RIGHT - favor atelectasis over airspace disease. 2. Cardiomegaly.  IMPRESSION: 1. No evidence of acute intracranial abnormality. 2. Atrophy and probable chronic small-vessel white matter ischemic changes. 3. Probable very small chronic bifrontal subdural hygromas. No midline shift.    PROCEDURES:  Critical Care performed: No  .1-3 Lead EKG Interpretation Performed by: Vanessa [place], MD Authorized by: Vanessa [place], MD     Interpretation: normal     ECG rate:  60   ECG rate assessment: normal     Rhythm: sinus rhythm     Ectopy: none     Conduction: normal     MEDICATIONS ORDERED IN ED: Medications  acetaminophen (TYLENOL) tablet 1,000 mg (1,000 mg Oral Given 02/16/22 1456)     IMPRESSION / MDM / ASSESSMENT AND PLAN / ED COURSE  I reviewed the triage vital signs and the nursing notes.                              Differential diagnosis includes, but is not limited to, ACS, chest x-ray to evaluate for any pneumothorax, pneumonia but seems less likely.  He denies any shortness of breath nontoxic with no unilateral leg swelling to suggest PE.  He denies any abdominal pain to suggest abdominal pathology.  Given patient on a blood thinner I did get a CT head to make sure no evidence of intracranial hemorrhage  BMP shows slightly elevated bicarb and low chloride and potassium.  Do not give a lot of fluids due to my concern with his CHF but will give some potassium repletion to try to help.  His hemoglobin is stable from 5 months ago and white count is also stable.  His initial troponin was slightly elevated and BNP is slightly elevated but I do not see any evidence of significant fluid overload requiring admission.  Discussed with patient admission for his chest pain and cardiac work-up versus going home if the repeat troponin is stable.  Patient states that he would prefer to go home and follow-up outpatient assuming that his troponin is stable.  Therefore will get repeat troponin and had patient off to oncoming team.  Patient's sugar was also notably low on the BMP so we will give some fluid and recheck it  CT head only notable for some chronic findings but nothing acute  At time of signout patient is denying any pain   The patient is on the cardiac monitor to evaluate for evidence of arrhythmia and/or significant heart rate changes.  FINAL CLINICAL IMPRESSION(S)  / ED DIAGNOSES   Final diagnoses:  Hypokalemia  Atypical chest pain     Rx / DC Orders   ED Discharge Orders  None        Note:  This document was prepared using Dragon voice recognition software and may include unintentional dictation errors.   Vanessa [place], MD 02/16/22 1524

## 2022-02-16 NOTE — Discharge Instructions (Addendum)
Please call the cardiology doctor to schedule a follow-up appointment but if you develop return of chest pain please return to the ER immediately

## 2022-02-17 DIAGNOSIS — L97819 Non-pressure chronic ulcer of other part of right lower leg with unspecified severity: Secondary | ICD-10-CM | POA: Diagnosis not present

## 2022-02-17 DIAGNOSIS — I110 Hypertensive heart disease with heart failure: Secondary | ICD-10-CM | POA: Diagnosis not present

## 2022-02-17 DIAGNOSIS — I83028 Varicose veins of left lower extremity with ulcer other part of lower leg: Secondary | ICD-10-CM | POA: Diagnosis not present

## 2022-02-17 DIAGNOSIS — I5023 Acute on chronic systolic (congestive) heart failure: Secondary | ICD-10-CM | POA: Diagnosis not present

## 2022-02-17 DIAGNOSIS — I4891 Unspecified atrial fibrillation: Secondary | ICD-10-CM | POA: Diagnosis not present

## 2022-02-17 DIAGNOSIS — I70212 Atherosclerosis of native arteries of extremities with intermittent claudication, left leg: Secondary | ICD-10-CM | POA: Diagnosis not present

## 2022-02-17 DIAGNOSIS — I70222 Atherosclerosis of native arteries of extremities with rest pain, left leg: Secondary | ICD-10-CM | POA: Diagnosis not present

## 2022-02-17 DIAGNOSIS — L97822 Non-pressure chronic ulcer of other part of left lower leg with fat layer exposed: Secondary | ICD-10-CM | POA: Diagnosis not present

## 2022-02-17 DIAGNOSIS — I2510 Atherosclerotic heart disease of native coronary artery without angina pectoris: Secondary | ICD-10-CM | POA: Diagnosis not present

## 2022-02-18 DIAGNOSIS — L97822 Non-pressure chronic ulcer of other part of left lower leg with fat layer exposed: Secondary | ICD-10-CM | POA: Diagnosis not present

## 2022-02-18 DIAGNOSIS — I83028 Varicose veins of left lower extremity with ulcer other part of lower leg: Secondary | ICD-10-CM | POA: Diagnosis not present

## 2022-02-18 DIAGNOSIS — I4891 Unspecified atrial fibrillation: Secondary | ICD-10-CM | POA: Diagnosis not present

## 2022-02-18 DIAGNOSIS — I110 Hypertensive heart disease with heart failure: Secondary | ICD-10-CM | POA: Diagnosis not present

## 2022-02-18 DIAGNOSIS — I70222 Atherosclerosis of native arteries of extremities with rest pain, left leg: Secondary | ICD-10-CM | POA: Diagnosis not present

## 2022-02-18 DIAGNOSIS — I5023 Acute on chronic systolic (congestive) heart failure: Secondary | ICD-10-CM | POA: Diagnosis not present

## 2022-02-18 DIAGNOSIS — I2510 Atherosclerotic heart disease of native coronary artery without angina pectoris: Secondary | ICD-10-CM | POA: Diagnosis not present

## 2022-02-18 DIAGNOSIS — L97819 Non-pressure chronic ulcer of other part of right lower leg with unspecified severity: Secondary | ICD-10-CM | POA: Diagnosis not present

## 2022-02-18 DIAGNOSIS — I70212 Atherosclerosis of native arteries of extremities with intermittent claudication, left leg: Secondary | ICD-10-CM | POA: Diagnosis not present

## 2022-02-21 DIAGNOSIS — L03115 Cellulitis of right lower limb: Secondary | ICD-10-CM | POA: Diagnosis not present

## 2022-02-21 DIAGNOSIS — E876 Hypokalemia: Secondary | ICD-10-CM | POA: Diagnosis not present

## 2022-02-21 DIAGNOSIS — I872 Venous insufficiency (chronic) (peripheral): Secondary | ICD-10-CM | POA: Diagnosis not present

## 2022-02-21 DIAGNOSIS — I509 Heart failure, unspecified: Secondary | ICD-10-CM | POA: Diagnosis not present

## 2022-02-24 DIAGNOSIS — I110 Hypertensive heart disease with heart failure: Secondary | ICD-10-CM | POA: Diagnosis not present

## 2022-02-24 DIAGNOSIS — L97822 Non-pressure chronic ulcer of other part of left lower leg with fat layer exposed: Secondary | ICD-10-CM | POA: Diagnosis not present

## 2022-02-24 DIAGNOSIS — I83028 Varicose veins of left lower extremity with ulcer other part of lower leg: Secondary | ICD-10-CM | POA: Diagnosis not present

## 2022-02-24 DIAGNOSIS — I5023 Acute on chronic systolic (congestive) heart failure: Secondary | ICD-10-CM | POA: Diagnosis not present

## 2022-02-24 DIAGNOSIS — I70222 Atherosclerosis of native arteries of extremities with rest pain, left leg: Secondary | ICD-10-CM | POA: Diagnosis not present

## 2022-02-24 DIAGNOSIS — L97819 Non-pressure chronic ulcer of other part of right lower leg with unspecified severity: Secondary | ICD-10-CM | POA: Diagnosis not present

## 2022-02-24 DIAGNOSIS — I70212 Atherosclerosis of native arteries of extremities with intermittent claudication, left leg: Secondary | ICD-10-CM | POA: Diagnosis not present

## 2022-02-24 DIAGNOSIS — I2510 Atherosclerotic heart disease of native coronary artery without angina pectoris: Secondary | ICD-10-CM | POA: Diagnosis not present

## 2022-02-24 DIAGNOSIS — I4891 Unspecified atrial fibrillation: Secondary | ICD-10-CM | POA: Diagnosis not present

## 2022-02-25 ENCOUNTER — Ambulatory Visit: Payer: Medicare HMO | Admitting: Dermatology

## 2022-02-27 DIAGNOSIS — L97819 Non-pressure chronic ulcer of other part of right lower leg with unspecified severity: Secondary | ICD-10-CM | POA: Diagnosis not present

## 2022-02-27 DIAGNOSIS — L97822 Non-pressure chronic ulcer of other part of left lower leg with fat layer exposed: Secondary | ICD-10-CM | POA: Diagnosis not present

## 2022-02-27 DIAGNOSIS — I5023 Acute on chronic systolic (congestive) heart failure: Secondary | ICD-10-CM | POA: Diagnosis not present

## 2022-02-27 DIAGNOSIS — I70212 Atherosclerosis of native arteries of extremities with intermittent claudication, left leg: Secondary | ICD-10-CM | POA: Diagnosis not present

## 2022-02-27 DIAGNOSIS — I4891 Unspecified atrial fibrillation: Secondary | ICD-10-CM | POA: Diagnosis not present

## 2022-02-27 DIAGNOSIS — I2510 Atherosclerotic heart disease of native coronary artery without angina pectoris: Secondary | ICD-10-CM | POA: Diagnosis not present

## 2022-02-27 DIAGNOSIS — I70222 Atherosclerosis of native arteries of extremities with rest pain, left leg: Secondary | ICD-10-CM | POA: Diagnosis not present

## 2022-02-27 DIAGNOSIS — I110 Hypertensive heart disease with heart failure: Secondary | ICD-10-CM | POA: Diagnosis not present

## 2022-02-27 DIAGNOSIS — I83028 Varicose veins of left lower extremity with ulcer other part of lower leg: Secondary | ICD-10-CM | POA: Diagnosis not present

## 2022-03-03 DIAGNOSIS — L97819 Non-pressure chronic ulcer of other part of right lower leg with unspecified severity: Secondary | ICD-10-CM | POA: Diagnosis not present

## 2022-03-03 DIAGNOSIS — L97822 Non-pressure chronic ulcer of other part of left lower leg with fat layer exposed: Secondary | ICD-10-CM | POA: Diagnosis not present

## 2022-03-03 DIAGNOSIS — I83028 Varicose veins of left lower extremity with ulcer other part of lower leg: Secondary | ICD-10-CM | POA: Diagnosis not present

## 2022-03-03 DIAGNOSIS — I70212 Atherosclerosis of native arteries of extremities with intermittent claudication, left leg: Secondary | ICD-10-CM | POA: Diagnosis not present

## 2022-03-03 DIAGNOSIS — I110 Hypertensive heart disease with heart failure: Secondary | ICD-10-CM | POA: Diagnosis not present

## 2022-03-03 DIAGNOSIS — I5023 Acute on chronic systolic (congestive) heart failure: Secondary | ICD-10-CM | POA: Diagnosis not present

## 2022-03-03 DIAGNOSIS — I70222 Atherosclerosis of native arteries of extremities with rest pain, left leg: Secondary | ICD-10-CM | POA: Diagnosis not present

## 2022-03-03 DIAGNOSIS — I4891 Unspecified atrial fibrillation: Secondary | ICD-10-CM | POA: Diagnosis not present

## 2022-03-03 DIAGNOSIS — I2510 Atherosclerotic heart disease of native coronary artery without angina pectoris: Secondary | ICD-10-CM | POA: Diagnosis not present

## 2022-03-06 DIAGNOSIS — I70212 Atherosclerosis of native arteries of extremities with intermittent claudication, left leg: Secondary | ICD-10-CM | POA: Diagnosis not present

## 2022-03-06 DIAGNOSIS — L97822 Non-pressure chronic ulcer of other part of left lower leg with fat layer exposed: Secondary | ICD-10-CM | POA: Diagnosis not present

## 2022-03-06 DIAGNOSIS — L97819 Non-pressure chronic ulcer of other part of right lower leg with unspecified severity: Secondary | ICD-10-CM | POA: Diagnosis not present

## 2022-03-06 DIAGNOSIS — I110 Hypertensive heart disease with heart failure: Secondary | ICD-10-CM | POA: Diagnosis not present

## 2022-03-06 DIAGNOSIS — I2510 Atherosclerotic heart disease of native coronary artery without angina pectoris: Secondary | ICD-10-CM | POA: Diagnosis not present

## 2022-03-06 DIAGNOSIS — I5023 Acute on chronic systolic (congestive) heart failure: Secondary | ICD-10-CM | POA: Diagnosis not present

## 2022-03-06 DIAGNOSIS — I4891 Unspecified atrial fibrillation: Secondary | ICD-10-CM | POA: Diagnosis not present

## 2022-03-06 DIAGNOSIS — I70222 Atherosclerosis of native arteries of extremities with rest pain, left leg: Secondary | ICD-10-CM | POA: Diagnosis not present

## 2022-03-06 DIAGNOSIS — I83028 Varicose veins of left lower extremity with ulcer other part of lower leg: Secondary | ICD-10-CM | POA: Diagnosis not present

## 2022-03-10 DIAGNOSIS — L97822 Non-pressure chronic ulcer of other part of left lower leg with fat layer exposed: Secondary | ICD-10-CM | POA: Diagnosis not present

## 2022-03-10 DIAGNOSIS — I70212 Atherosclerosis of native arteries of extremities with intermittent claudication, left leg: Secondary | ICD-10-CM | POA: Diagnosis not present

## 2022-03-10 DIAGNOSIS — I4891 Unspecified atrial fibrillation: Secondary | ICD-10-CM | POA: Diagnosis not present

## 2022-03-10 DIAGNOSIS — L97819 Non-pressure chronic ulcer of other part of right lower leg with unspecified severity: Secondary | ICD-10-CM | POA: Diagnosis not present

## 2022-03-10 DIAGNOSIS — I83028 Varicose veins of left lower extremity with ulcer other part of lower leg: Secondary | ICD-10-CM | POA: Diagnosis not present

## 2022-03-10 DIAGNOSIS — I5023 Acute on chronic systolic (congestive) heart failure: Secondary | ICD-10-CM | POA: Diagnosis not present

## 2022-03-10 DIAGNOSIS — I2510 Atherosclerotic heart disease of native coronary artery without angina pectoris: Secondary | ICD-10-CM | POA: Diagnosis not present

## 2022-03-10 DIAGNOSIS — I70222 Atherosclerosis of native arteries of extremities with rest pain, left leg: Secondary | ICD-10-CM | POA: Diagnosis not present

## 2022-03-10 DIAGNOSIS — I110 Hypertensive heart disease with heart failure: Secondary | ICD-10-CM | POA: Diagnosis not present

## 2022-03-13 DIAGNOSIS — I70212 Atherosclerosis of native arteries of extremities with intermittent claudication, left leg: Secondary | ICD-10-CM | POA: Diagnosis not present

## 2022-03-13 DIAGNOSIS — I4891 Unspecified atrial fibrillation: Secondary | ICD-10-CM | POA: Diagnosis not present

## 2022-03-13 DIAGNOSIS — I110 Hypertensive heart disease with heart failure: Secondary | ICD-10-CM | POA: Diagnosis not present

## 2022-03-13 DIAGNOSIS — L97822 Non-pressure chronic ulcer of other part of left lower leg with fat layer exposed: Secondary | ICD-10-CM | POA: Diagnosis not present

## 2022-03-13 DIAGNOSIS — I5023 Acute on chronic systolic (congestive) heart failure: Secondary | ICD-10-CM | POA: Diagnosis not present

## 2022-03-13 DIAGNOSIS — I2510 Atherosclerotic heart disease of native coronary artery without angina pectoris: Secondary | ICD-10-CM | POA: Diagnosis not present

## 2022-03-13 DIAGNOSIS — L97819 Non-pressure chronic ulcer of other part of right lower leg with unspecified severity: Secondary | ICD-10-CM | POA: Diagnosis not present

## 2022-03-13 DIAGNOSIS — I83028 Varicose veins of left lower extremity with ulcer other part of lower leg: Secondary | ICD-10-CM | POA: Diagnosis not present

## 2022-03-13 DIAGNOSIS — I70222 Atherosclerosis of native arteries of extremities with rest pain, left leg: Secondary | ICD-10-CM | POA: Diagnosis not present

## 2022-03-17 DIAGNOSIS — S81801A Unspecified open wound, right lower leg, initial encounter: Secondary | ICD-10-CM | POA: Diagnosis not present

## 2022-03-17 DIAGNOSIS — I83028 Varicose veins of left lower extremity with ulcer other part of lower leg: Secondary | ICD-10-CM | POA: Diagnosis not present

## 2022-03-17 DIAGNOSIS — I4891 Unspecified atrial fibrillation: Secondary | ICD-10-CM | POA: Diagnosis not present

## 2022-03-17 DIAGNOSIS — I110 Hypertensive heart disease with heart failure: Secondary | ICD-10-CM | POA: Diagnosis not present

## 2022-03-17 DIAGNOSIS — L97511 Non-pressure chronic ulcer of other part of right foot limited to breakdown of skin: Secondary | ICD-10-CM | POA: Diagnosis not present

## 2022-03-17 DIAGNOSIS — I70222 Atherosclerosis of native arteries of extremities with rest pain, left leg: Secondary | ICD-10-CM | POA: Diagnosis not present

## 2022-03-17 DIAGNOSIS — I5023 Acute on chronic systolic (congestive) heart failure: Secondary | ICD-10-CM | POA: Diagnosis not present

## 2022-03-17 DIAGNOSIS — I70212 Atherosclerosis of native arteries of extremities with intermittent claudication, left leg: Secondary | ICD-10-CM | POA: Diagnosis not present

## 2022-03-17 DIAGNOSIS — L97819 Non-pressure chronic ulcer of other part of right lower leg with unspecified severity: Secondary | ICD-10-CM | POA: Diagnosis not present

## 2022-03-17 DIAGNOSIS — L97822 Non-pressure chronic ulcer of other part of left lower leg with fat layer exposed: Secondary | ICD-10-CM | POA: Diagnosis not present

## 2022-03-17 DIAGNOSIS — I2510 Atherosclerotic heart disease of native coronary artery without angina pectoris: Secondary | ICD-10-CM | POA: Diagnosis not present

## 2022-03-17 DIAGNOSIS — R21 Rash and other nonspecific skin eruption: Secondary | ICD-10-CM | POA: Diagnosis not present

## 2022-03-18 ENCOUNTER — Ambulatory Visit: Payer: Medicare HMO | Admitting: Dermatology

## 2022-03-18 ENCOUNTER — Other Ambulatory Visit: Payer: Self-pay | Admitting: Dermatology

## 2022-03-18 ENCOUNTER — Other Ambulatory Visit: Payer: Self-pay

## 2022-03-18 DIAGNOSIS — S81801A Unspecified open wound, right lower leg, initial encounter: Secondary | ICD-10-CM | POA: Diagnosis not present

## 2022-03-18 DIAGNOSIS — L578 Other skin changes due to chronic exposure to nonionizing radiation: Secondary | ICD-10-CM

## 2022-03-18 DIAGNOSIS — R21 Rash and other nonspecific skin eruption: Secondary | ICD-10-CM | POA: Diagnosis not present

## 2022-03-18 DIAGNOSIS — L820 Inflamed seborrheic keratosis: Secondary | ICD-10-CM

## 2022-03-18 DIAGNOSIS — L821 Other seborrheic keratosis: Secondary | ICD-10-CM

## 2022-03-18 DIAGNOSIS — L97511 Non-pressure chronic ulcer of other part of right foot limited to breakdown of skin: Secondary | ICD-10-CM | POA: Diagnosis not present

## 2022-03-18 DIAGNOSIS — D489 Neoplasm of uncertain behavior, unspecified: Secondary | ICD-10-CM

## 2022-03-18 DIAGNOSIS — L111 Transient acantholytic dermatosis [Grover]: Secondary | ICD-10-CM

## 2022-03-18 DIAGNOSIS — C44329 Squamous cell carcinoma of skin of other parts of face: Secondary | ICD-10-CM

## 2022-03-18 MED ORDER — TRIAMCINOLONE ACETONIDE 0.1 % EX CREA: TOPICAL_CREAM | CUTANEOUS | 1 refills | Status: DC

## 2022-03-18 NOTE — Patient Instructions (Addendum)
Grover's Disease (or Transient Acantholytic Dermatosis) is an acquired chronic; persistent and recurrent disease of unknown etiology that has been linked to heat, sweating, excessive sun exposure, ionizing radiation, and some medications including immunotherapies such as BRAF inhibitors, sulfadoxine-pyrimethamine, recombinant IL-4, ipilimumab, and other immune checkpoint inhibitors.  It more commonly presents in the winter and can be associated with atopic dermatitis, renal failure, transplantation and malignancies. ?It is very difficult to treat and can be persistent and recurrent but topical steroids, calcipotriene cream, antihistamines can be helpful.  For recalcitrant disease, other treatments have been used such as isotretinoin, acitretin, systemic steroids, and Dupixent. ? ?Start Triamcinolone 0.1% cream to rash areas on the back and abdomen once to twice daily as needed. Avoid applying to face, groin, and axilla. Use as directed. Long-term use can cause thinning of the skin. ? ?Topical steroids (such as triamcinolone, fluocinolone, fluocinonide, mometasone, clobetasol, halobetasol, betamethasone, hydrocortisone) can cause thinning and lightening of the skin if they are used for too long in the same area. Your physician has selected the right strength medicine for your problem and area affected on the body. Please use your medication only as directed by your physician to prevent side effects.  ?  ? ? ?Wound Care Instructions ? ?Cleanse wound gently with soap and water once a day then pat dry with clean gauze. Apply a thing coat of Petrolatum (petroleum jelly, "Vaseline") over the wound (unless you have an allergy to this). We recommend that you use a new, sterile tube of Vaseline. Do not pick or remove scabs. Do not remove the yellow or white "healing tissue" from the base of the wound. ? ?Cover the wound with fresh, clean, nonstick gauze and secure with paper tape. You may use Band-Aids in place of gauze and  tape if the would is small enough, but would recommend trimming much of the tape off as there is often too much. Sometimes Band-Aids can irritate the skin. ? ?You should call the office for your biopsy report after 1 week if you have not already been contacted. ? ?If you experience any problems, such as abnormal amounts of bleeding, swelling, significant bruising, significant pain, or evidence of infection, please call the office immediately. ? ?FOR ADULT SURGERY PATIENTS: If you need something for pain relief you may take 1 extra strength Tylenol (acetaminophen) AND 2 Ibuprofen (263m each) together every 4 hours as needed for pain. (do not take these if you are allergic to them or if you have a reason you should not take them.) Typically, you may only need pain medication for 1 to 3 days.  ? ?If You Need Anything After Your Visit ? ?If you have any questions or concerns for your doctor, please call our main line at [phone] press option 4 to reach your doctor's medical assistant. If no one answers, please leave a voicemail as directed and we will return your call as soon as possible. Messages left after 4 pm will be answered the following business day.  ? ?You may also send [place] message via MyChart. We typically respond to MyChart messages within 1-2 business days. ? ?For prescription refills, please ask your pharmacy to contact our office. Our fax number is [phone] ? ?If you have an urgent issue when the clinic is closed that cannot wait until the next business day, you can page your doctor at the number below.   ? ?Please note that while we do our best to be available for urgent issues outside of  office hours, we are not available 24/7.  ? ?If you have an urgent issue and are unable to reach [place], you may choose to seek medical care at your doctor's office, retail clinic, urgent care center, or emergency room. ? ?If you have a medical emergency, please immediately call 911 or go to the emergency  department. ? ?Pager Numbers ? ?- Dr. Nehemiah Massed: [phone] ? ?- Dr. Laurence Ferrari: [phone] ? ?- Dr. Nicole Kindred: [phone] ? ?In the event of inclement weather, please call our main line at [phone] for an update on the status of any delays or closures. ? ?Dermatology Medication Tips: ?Please keep the boxes that topical medications come in in order to help keep track of the instructions about where and how to use these. Pharmacies typically print the medication instructions only on the boxes and not directly on the medication tubes.  ? ?If your medication is too expensive, please contact our office at [phone] option 4 or send [place] a message through [place].  ? ?We are unable to tell what your co-pay for medications will be in advance as this is different depending on your insurance coverage. However, we may be able to find a substitute medication at lower cost or fill out paperwork to get insurance to cover a needed medication.  ? ?If a prior authorization is required to get your medication covered by your insurance company, please allow [place] 1-2 business days to complete this process. ? ?Drug prices often vary depending on where the prescription is filled and some pharmacies may offer cheaper prices. ? ?The website www.goodrx.com contains coupons for medications through different pharmacies. The prices here do not account for what the cost may be with help from insurance (it may be cheaper with your insurance), but the website can give you the price if you did not use any insurance.  ?- You can print the associated coupon and take it with your prescription to the pharmacy.  ?- You may also stop by our office during regular business hours and pick up a GoodRx coupon card.  ?- If you need your prescription sent electronically to a different pharmacy, notify our office through [facility] or by phone at [phone] option 4. ? ? ? ? ?Si Usted Necesita Algo Despu?s de Su Visita ? ?Tambi?n puede enviarnos un  mensaje a trav?s de MyChart. Por lo general respondemos a los mensajes de MyChart en el transcurso de 1 a 2 d?as h?biles. ? ?Para renovar recetas, por favor pida a su farmacia que se ponga en contacto con nuestra oficina. Nuestro n?mero de fax es el [phone]. ? ?Si tiene un asunto urgente cuando la cl?nica est? cerrada y que no puede esperar hasta el siguiente d?a h?bil, puede llamar/localizar a su doctor(a) al n?mero que aparece a continuaci?n.  ? ?Por favor, tenga en cuenta que aunque hacemos todo lo posible para estar disponibles para asuntos urgentes fuera del horario de oficina, no estamos disponibles las 24 horas del d?a, los 7 d?as de la semana.  ? ?Si tiene un problema urgente y no puede comunicarse con nosotros, puede optar por buscar atenci?n m?dica  en el consultorio de su doctor(a), en una cl?nica privada, en un centro de atenci?n urgente o en una sala de emergencias. ? ?Si tiene Engineer, maintenance (IT) m?dica, por favor llame inmediatamente al 911 o vaya a la sala de emergencias. ? ?N?meros de b?per ? ?- Dr. Nehemiah Massed: [phone] ? ?- Dra. Moye: [phone] ? ?- Dra. Nicole Kindred: [phone] ? ?En caso de inclemencias  del [place], por favor llame a nuestra l?nea principal al [phone] para una actualizaci?n sobre el estado de cualquier retraso o cierre. ? ?Consejos para la medicaci?n en dermatolog?a: ?Por favor, guarde las cajas en las que vienen los medicamentos de uso t?pico para ayudarle a seguir las instrucciones sobre d?nde y c?mo usarlos. Las farmacias generalmente imprimen las instrucciones del medicamento s?lo en las cajas y no directamente en los tubos del [place].  ? ?Si su medicamento es muy caro, por favor, p?ngase en contacto con Zigmund Daniel llamando al [phone] y presione la opci?n 4 o env?enos un mensaje a trav?s de MyChart.  ? ?No podemos decirle cu?l ser? su copago por los medicamentos por adelantado ya que esto es diferente dependiendo de la cobertura de su seguro. Sin embargo,  es posible que podamos encontrar un medicamento sustituto a Electrical engineer un formulario para que el seguro cubra el medicamento que se considera necesario.  ? ?Si se requiere Ardelia Mems autorizaci?n prev

## 2022-03-18 NOTE — Progress Notes (Signed)
? ?Follow-Up Visit ?  ?Subjective  ?[name] [name] is a 88 y.o. male who presents for the following: Rash (On the abdomen and back - No new or changing detergents, soaps, etc. No new medications other than Doculax which he started taking before rash occurred. ) and Non-healing wound (From bx proven poorly differentiated SCC - previously treated with [facility] 11/18/21 but lesion hasn't healed, is tender, and bleeds at times. ). ?The patient has spots, moles and lesions to be evaluated, some may be new or changing and the patient has concerns that these could be cancer. ? ?The following portions of the chart were reviewed this encounter and updated as appropriate:  ? Tobacco  Allergies  Meds  Problems  Med Hx  Surg Hx  Fam Hx   ?  ?Review of Systems:  No other skin or systemic complaints except as noted in HPI or Assessment and Plan. ? ?Objective  ?Well appearing patient in no apparent distress; mood and affect are within normal limits. ? ?A focused examination was performed including the face, trunk, extremities. Relevant physical exam findings are noted in the Assessment and Plan. ? ?Back ?Patchy erythema with crusting.  ? ?Back x 16 (16) ?Erythematous stuck-on, waxy papule or plaque ? ?R mandible and ear ?No LAD today. Evidence of recurrence at site.  ? ? ? ? ? 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  It more commonly presents in the winter and can be associated with atopic dermatitis, renal failure, transplantation and malignancies. ?It is very difficult to treat and can be persistent and recurrent but topical steroids,  calcipotriene cream, antihistamines can be helpful.  For recalcitrant disease, other treatments have been used such as isotretinoin, acitretin, systemic steroids, and Dupixent. ? ?Start TMC 0.1% cream to aa's QD-BID PRN.  ?Topical steroids (such as triamcinolone, fluocinolone, fluocinonide, mometasone, clobetasol, halobetasol, betamethasone, hydrocortisone) can cause thinning and lightening of the skin if they are used for too long in the same area. Your physician has selected the right strength medicine for your problem and area affected on the body. Please use your medication only as directed by your physician to prevent side effects.  ? ?triamcinolone cream (KENALOG) 0.1 % - Back ?Apply to the chest and back QD-BID PRN itching. Avoid applying to face, groin, and axilla. Use as directed. Long-term use can cause thinning of the skin. ? ?Inflamed seborrheic keratosis ?Back x 16 ?Destruction of lesion - Back x 16 ?Complexity: simple   ?Destruction method: cryotherapy   ?Informed consent: discussed and consent obtained   ?Timeout:  patient name, date of birth, surgical site, and procedure verified ?Lesion destroyed using liquid nitrogen: Yes   ?Region frozen until ice ball extended beyond lesion: Yes   ?Outcome: patient tolerated procedure well with no complications   ?Post-procedure details: wound care instructions given   ? ?Neoplasm of uncertain behavior -  ?appears to be recurrent Poorly differentiated SCC  ?No lymphadenopathy of neck today. ?R mandible and ear ?Epidermal / dermal shaving ?Shave removal performed today for debulking and confirmation of pathology.  ? ?Lesion diameter (cm):  2.1 ?Informed consent: discussed and consent obtained   ?Timeout: patient name, date of birth, surgical site, and procedure verified   ?Procedure  prep:  Patient was prepped and draped in usual sterile fashion ?Prep type:  Isopropyl alcohol ?Anesthesia: the lesion was anesthetized in a standard fashion   ?Anesthetic:  1%  lidocaine w/ epinephrine 1-100,000 buffered w/ 8.4% NaHCO3 ?Instrument used: flexible razor blade   ?Hemostasis achieved with: pressure, aluminum chloride and electrodesiccation   ?Outcome: patient tolerated procedure well   ?Post-procedure details: sterile dressing applied and wound care instructions given   ?Dressing type: bandage and petrolatum   ? ?Specimen 1 - Surgical pathology ?Differential Diagnosis: D48.5 r/o recurrent SCC  ?[id number] ?Check Margins: No ? ?Evidence of recurrence today of pathology proven poorly differentiated squamous cell carcinoma - S/P ED&C (performed at time of biopsy) ?Discussed treatment option of Mohs vs radiation. Will plan referral to Dr. Lacinda Axon for Mohs procedure if positive for recurrence.   ? ?Shave removal performed today for debulking and confirmation of pathology.  ? ?Seborrheic Keratoses ?- Stuck-on, waxy, tan-brown papules and/or plaques  ?- Benign-appearing ?- Discussed benign etiology and prognosis. ?- Observe ?- Call for any changes ? ?Actinic Damage ?- chronic, secondary to cumulative UV radiation exposure/sun exposure over time ?- diffuse scaly erythematous macules with underlying dyspigmentation ?- Recommend daily broad spectrum sunscreen SPF 30+ to sun-exposed areas, reapply every 2 hours as needed.  ?- Recommend staying in the shade or wearing long sleeves, sun glasses (UVA+UVB protection) and wide brim hats (4-inch brim around the entire circumference of the hat). ?- Call for new or changing lesions. ? ?Return in about 2 days (around 03/20/2022) for wound recheck . ? ?IRudell Cobb, CMA, am acting as scribe for Sarina Ser, MD . ?Documentation: I have reviewed the above documentation for accuracy and completeness, and I agree with the above. ? ?Sarina Ser, MD ? ?

## 2022-03-20 ENCOUNTER — Encounter: Payer: Self-pay | Admitting: Dermatology

## 2022-03-20 ENCOUNTER — Ambulatory Visit (INDEPENDENT_AMBULATORY_CARE_PROVIDER_SITE_OTHER): Payer: Medicare HMO | Admitting: Dermatology

## 2022-03-20 ENCOUNTER — Other Ambulatory Visit: Payer: Self-pay

## 2022-03-20 DIAGNOSIS — Z4800 Encounter for change or removal of nonsurgical wound dressing: Secondary | ICD-10-CM

## 2022-03-20 DIAGNOSIS — I4891 Unspecified atrial fibrillation: Secondary | ICD-10-CM | POA: Diagnosis not present

## 2022-03-20 DIAGNOSIS — I5023 Acute on chronic systolic (congestive) heart failure: Secondary | ICD-10-CM | POA: Diagnosis not present

## 2022-03-20 DIAGNOSIS — I2510 Atherosclerotic heart disease of native coronary artery without angina pectoris: Secondary | ICD-10-CM | POA: Diagnosis not present

## 2022-03-20 DIAGNOSIS — I110 Hypertensive heart disease with heart failure: Secondary | ICD-10-CM | POA: Diagnosis not present

## 2022-03-20 DIAGNOSIS — L97822 Non-pressure chronic ulcer of other part of left lower leg with fat layer exposed: Secondary | ICD-10-CM | POA: Diagnosis not present

## 2022-03-20 DIAGNOSIS — L97819 Non-pressure chronic ulcer of other part of right lower leg with unspecified severity: Secondary | ICD-10-CM | POA: Diagnosis not present

## 2022-03-20 DIAGNOSIS — I70222 Atherosclerosis of native arteries of extremities with rest pain, left leg: Secondary | ICD-10-CM | POA: Diagnosis not present

## 2022-03-20 DIAGNOSIS — I83028 Varicose veins of left lower extremity with ulcer other part of lower leg: Secondary | ICD-10-CM | POA: Diagnosis not present

## 2022-03-20 DIAGNOSIS — C44329 Squamous cell carcinoma of skin of other parts of face: Secondary | ICD-10-CM

## 2022-03-20 DIAGNOSIS — I70212 Atherosclerosis of native arteries of extremities with intermittent claudication, left leg: Secondary | ICD-10-CM | POA: Diagnosis not present

## 2022-03-20 NOTE — Progress Notes (Signed)
? ?  Follow-Up Visit ?  ?Subjective  ?[name] [name] is a 88 y.o. male who presents for the following: Follow-up (Patient here today for 2 day follow up on wound at right mandible. ). ? ?The following portions of the chart were reviewed this encounter and updated as appropriate:  Tobacco  Allergies  Meds  Problems  Med Hx  Surg Hx  Fam Hx   ?  ?Review of Systems: No other skin or systemic complaints except as noted in HPI or Assessment and Plan. ? ?Objective  ?Well appearing patient in no apparent distress; mood and affect are within normal limits. ? ?A focused examination was performed including right mandible and right ear . Relevant physical exam findings are noted in the Assessment and Plan. ? ?right mandible and ear ?Healing wound at R mandible and ear ? ? ?Assessment & Plan  ?Encounter for change or removal of nonsurgical wound dressing ?right mandible and ear ?Pathology proven recurrent Poorly differentiated SCC  ?No lymphadenopathy of neck today. ? ?Will plan referral to Dr. Lacinda Axon for Mohs procedure    ? ?Bandage changed and wound cleaned with puracyn  ?Mupirocin applied, non stick telfa applied with pressure bandage ? ?Return for 3 month follow up h/o scc . ?Garry Heater, CMA, am acting as scribe for Sarina Ser, MD. ?Documentation: I have reviewed the above documentation for accuracy and completeness, and I agree with the above. ? ?Sarina Ser, MD ? ?

## 2022-03-20 NOTE — Patient Instructions (Signed)

## 2022-03-24 ENCOUNTER — Encounter: Payer: Self-pay | Admitting: Dermatology

## 2022-03-24 DIAGNOSIS — L97511 Non-pressure chronic ulcer of other part of right foot limited to breakdown of skin: Secondary | ICD-10-CM | POA: Diagnosis not present

## 2022-03-24 DIAGNOSIS — L03115 Cellulitis of right lower limb: Secondary | ICD-10-CM | POA: Diagnosis not present

## 2022-03-24 DIAGNOSIS — F5101 Primary insomnia: Secondary | ICD-10-CM | POA: Diagnosis not present

## 2022-03-25 ENCOUNTER — Telehealth: Payer: Self-pay

## 2022-03-25 ENCOUNTER — Other Ambulatory Visit: Payer: Self-pay

## 2022-03-25 DIAGNOSIS — C44320 Squamous cell carcinoma of skin of unspecified parts of face: Secondary | ICD-10-CM

## 2022-03-25 NOTE — Telephone Encounter (Signed)
-----   Message from Ralene Bathe, MD sent at 03/20/2022  5:44 PM EDT ----- ?Diagnosis ?Skin (M), right mandible and ear ?POORLY DIFFERENTIATED SQUAMOUS CELL CARCINOMA, BASE INVOLVED ? ?Cancer - RECURRENT SCC ?Poorly differentiated ?Schedule for MOHS as soon as possible (see 03/18/2022 note) ?

## 2022-03-25 NOTE — Telephone Encounter (Signed)
Discussed biopsy results with pt daughter, referral will be sent to Dr Lacinda Axon today  ?

## 2022-03-27 ENCOUNTER — Encounter (INDEPENDENT_AMBULATORY_CARE_PROVIDER_SITE_OTHER): Payer: Medicare HMO

## 2022-03-27 ENCOUNTER — Ambulatory Visit (INDEPENDENT_AMBULATORY_CARE_PROVIDER_SITE_OTHER): Payer: Medicare HMO | Admitting: Vascular Surgery

## 2022-03-28 DIAGNOSIS — I5020 Unspecified systolic (congestive) heart failure: Secondary | ICD-10-CM | POA: Diagnosis not present

## 2022-03-28 DIAGNOSIS — E119 Type 2 diabetes mellitus without complications: Secondary | ICD-10-CM | POA: Diagnosis not present

## 2022-03-28 DIAGNOSIS — I872 Venous insufficiency (chronic) (peripheral): Secondary | ICD-10-CM | POA: Diagnosis not present

## 2022-03-31 DIAGNOSIS — I83028 Varicose veins of left lower extremity with ulcer other part of lower leg: Secondary | ICD-10-CM | POA: Diagnosis not present

## 2022-03-31 DIAGNOSIS — I70212 Atherosclerosis of native arteries of extremities with intermittent claudication, left leg: Secondary | ICD-10-CM | POA: Diagnosis not present

## 2022-03-31 DIAGNOSIS — I2510 Atherosclerotic heart disease of native coronary artery without angina pectoris: Secondary | ICD-10-CM | POA: Diagnosis not present

## 2022-03-31 DIAGNOSIS — L97819 Non-pressure chronic ulcer of other part of right lower leg with unspecified severity: Secondary | ICD-10-CM | POA: Diagnosis not present

## 2022-03-31 DIAGNOSIS — I83018 Varicose veins of right lower extremity with ulcer other part of lower leg: Secondary | ICD-10-CM | POA: Diagnosis not present

## 2022-03-31 DIAGNOSIS — I4891 Unspecified atrial fibrillation: Secondary | ICD-10-CM | POA: Diagnosis not present

## 2022-03-31 DIAGNOSIS — L97822 Non-pressure chronic ulcer of other part of left lower leg with fat layer exposed: Secondary | ICD-10-CM | POA: Diagnosis not present

## 2022-03-31 DIAGNOSIS — I70222 Atherosclerosis of native arteries of extremities with rest pain, left leg: Secondary | ICD-10-CM | POA: Diagnosis not present

## 2022-03-31 DIAGNOSIS — I110 Hypertensive heart disease with heart failure: Secondary | ICD-10-CM | POA: Diagnosis not present

## 2022-04-01 ENCOUNTER — Ambulatory Visit: Payer: Medicare HMO | Admitting: Dermatology

## 2022-04-01 ENCOUNTER — Encounter: Payer: Self-pay | Admitting: Dermatology

## 2022-04-01 ENCOUNTER — Telehealth: Payer: Self-pay

## 2022-04-01 DIAGNOSIS — L820 Inflamed seborrheic keratosis: Secondary | ICD-10-CM

## 2022-04-01 DIAGNOSIS — C44329 Squamous cell carcinoma of skin of other parts of face: Secondary | ICD-10-CM

## 2022-04-01 DIAGNOSIS — L570 Actinic keratosis: Secondary | ICD-10-CM | POA: Diagnosis not present

## 2022-04-01 DIAGNOSIS — L578 Other skin changes due to chronic exposure to nonionizing radiation: Secondary | ICD-10-CM

## 2022-04-01 DIAGNOSIS — Z5189 Encounter for other specified aftercare: Secondary | ICD-10-CM

## 2022-04-01 NOTE — Patient Instructions (Addendum)
Cryotherapy Aftercare ? ?Wash gently with soap and water everyday.   ?Apply Vaseline and Band-Aid daily until healed.  ? ?Prior to procedure, discussed risks of blister formation, small wound, skin dyspigmentation, or rare scar following cryotherapy. Recommend Vaseline ointment to treated areas while healing.  ? ?If keeping covered with bandage:  ?Wash wound once daily with CLN facial cleanser. Apply thin film of Zilxi, then apply thin film of Xepi. ? ?If not keeping covered with bandage then do this twice daily.  ? ?Keep appointment with Dr. Lacinda Axon. ? ?If You Need Anything After Your Visit ? ?If you have any questions or concerns for your doctor, please call our main line at [phone] and press option 4 to reach your doctor's medical assistant. If no one answers, please leave a voicemail as directed and we will return your call as soon as possible. Messages left after 4 pm will be answered the following business day.  ? ?You may also send [place] a message via MyChart. We typically respond to MyChart messages within 1-2 business days. ? ?For prescription refills, please ask your pharmacy to contact our office. Our fax number is [phone]. ? ?If you have an urgent issue when the clinic is closed that cannot wait until the next business day, you can page your doctor at the number below.   ? ?Please note that while we do our best to be available for urgent issues outside of office hours, we are not available 24/7.  ? ?If you have an urgent issue and are unable to reach [place], you may choose to seek medical care at your doctor's office, retail clinic, urgent care center, or emergency room. ? ?If you have a medical emergency, please immediately call 911 or go to the emergency department. ? ?Pager Numbers ? ?- Dr. Nehemiah Massed: [phone] ? ?- Dr. Laurence Ferrari: [phone] ? ?- Dr. Nicole Kindred: [phone] ? ?In the event of inclement weather, please call our main line at [phone] for an update on the status of any delays or  closures. ? ?Dermatology Medication Tips: ?Please keep the boxes that topical medications come in in order to help keep track of the instructions about where and how to use these. Pharmacies typically print the medication instructions only on the boxes and not directly on the medication tubes.  ? ?If your medication is too expensive, please contact our office at [phone] option 4 or send [place] a message through [place].  ? ?We are unable to tell what your co-pay for medications will be in advance as this is different depending on your insurance coverage. However, we may be able to find a substitute medication at lower cost or fill out paperwork to get insurance to cover a needed medication.  ? ?If a prior authorization is required to get your medication covered by your insurance company, please allow [place] 1-2 business days to complete this process. ? ?Drug prices often vary depending on where the prescription is filled and some pharmacies may offer cheaper prices. ? ?The website www.goodrx.com contains coupons for medications through different pharmacies. The prices here do not account for what the cost may be with help from insurance (it may be cheaper with your insurance), but the website can give you the price if you did not use any insurance.  ?- You can print the associated coupon and take it with your prescription to the pharmacy.  ?- You may also stop by our office during regular business hours and pick up a GoodRx coupon card.  ?-  If you need your prescription sent electronically to a different pharmacy, notify our office through [facility] or by phone at [phone] option 4. ? ? ? ? ?Si Usted Necesita Algo Despu?s de Su Visita ? ?Tambi?n puede enviarnos un mensaje a trav?s de MyChart. Por lo general respondemos a los mensajes de MyChart en el transcurso de 1 a 2 d?as h?biles. ? ?Para renovar recetas, por favor pida a su farmacia que se ponga en contacto con nuestra oficina. Nuestro n?mero de fax  es el [phone]. ? ?Si tiene un asunto urgente cuando la cl?nica est? cerrada y que no puede esperar hasta el siguiente d?a h?bil, puede llamar/localizar a su doctor(a) al n?mero que aparece a continuaci?n.  ? ?Por favor, tenga en cuenta que aunque hacemos todo lo posible para estar disponibles para asuntos urgentes fuera del horario de oficina, no estamos disponibles las 24 horas del d?a, los 7 d?as de la semana.  ? ?Si tiene un problema urgente y no puede comunicarse con nosotros, puede optar por buscar atenci?n m?dica  en el consultorio de su doctor(a), en una cl?nica privada, en un centro de atenci?n urgente o en una sala de emergencias. ? ?Si tiene Engineer, maintenance (IT) m?dica, por favor llame inmediatamente al 911 o vaya a la sala de emergencias. ? ?N?meros de b?per ? ?- Dr. Nehemiah Massed: [phone] ? ?- Dra. Moye: [phone] ? ?- Dra. Nicole Kindred: [phone] ? ?En caso de inclemencias del tiempo, por favor llame a nuestra l?nea principal al [phone] para una actualizaci?n sobre el estado de cualquier retraso o cierre. ? ?Consejos para la medicaci?n en dermatolog?a: ?Por favor, guarde las cajas en las que vienen los medicamentos de uso t?pico para ayudarle a seguir las instrucciones sobre d?nde y c?mo usarlos. Las farmacias generalmente imprimen las instrucciones del medicamento s?lo en las cajas y no directamente en los tubos del [place].  ? ?Si su medicamento es muy caro, por favor, p?ngase en contacto con Zigmund Daniel llamando al [phone] y presione la opci?n 4 o env?enos un mensaje a trav?s de MyChart.  ? ?No podemos decirle cu?l ser? su copago por los medicamentos por adelantado ya que esto es diferente dependiendo de la cobertura de su seguro. Sin embargo, es posible que podamos encontrar un medicamento sustituto a Electrical engineer un formulario para que el seguro cubra el medicamento que se considera necesario.  ? ?Si se requiere Ardelia Mems autorizaci?n previa para que su compa??a de seguros [place]  su medicamento, por favor perm?tanos de 1 a 2 d?as h?biles para completar este proceso. ? ?Los precios de los medicamentos var?an con frecuencia dependiendo del Environmental consultant de d?nde se surte la receta y alguna farmacias pueden ofrecer precios m?s baratos. ? ?El sitio web www.goodrx.com tiene cupones para medicamentos de Airline pilot. Los precios aqu? no tienen en cuenta lo que podr?a costar con la ayuda del seguro (puede ser m?s barato con su seguro), pero el sitio web puede darle el precio si no utiliz? ning?n seguro.  ?- Puede imprimir el cup?n correspondiente y llevarlo con su receta a la farmacia.  ?- Tambi?n puede pasar por nuestra oficina durante el horario de atenci?n regular y recoger una tarjeta de cupones de GoodRx.  ?- Si necesita que su receta se env?e electr?nicamente a Chiropodist, informe a nuestra oficina a trav?s de MyChart de [place] o por tel?fono llamando al [phone] y presione la opci?n 4.  ?

## 2022-04-01 NOTE — Progress Notes (Signed)
? ?Follow-Up Visit ?  ?Subjective  ?[name] [name] is a 88 y.o. male who presents for the following: Wound Check (Check SCC site. Right mandible/ear. Patient concerned could be infected. Bx: 03/18/2022. Has appointment for Mohs with Dr. Lacinda Axon 04/07/2022. Has two other spots on face he would like checked. Hits these areas when shaving). ?The patient has spots, moles and lesions to be evaluated, some may be new or changing and the patient has concerns that these could be cancer. ? ?The following portions of the chart were reviewed this encounter and updated as appropriate:  Tobacco  Allergies  Meds  Problems  Med Hx  Surg Hx  Fam Hx   ?  ?Review of Systems: No other skin or systemic complaints except as noted in HPI or Assessment and Plan. ? ?Objective  ?Well appearing patient in no apparent distress; mood and affect are within normal limits. ? ?A focused examination was performed including head, including the scalp, face, neck, nose, ears, eyelids, and lips. Relevant physical exam findings are noted in the Assessment and Plan. ? ?right mandible/ear ?Ulcerated healing surgical wound with serous fluid and surrounding purpura, secondary to bandage ? ?Right Cheek x1 ?Erythematous thin papules/macules with gritty scale.  ? ?Right Cheek x1 ?Erythematous keratotic or waxy stuck-on papule or plaque. ? ? ?Assessment & Plan  ? ?Actinic Damage ?- chronic, secondary to cumulative UV radiation exposure/sun exposure over time ?- diffuse scaly erythematous macules with underlying dyspigmentation ?- Recommend daily broad spectrum sunscreen SPF 30+ to sun-exposed areas, reapply every 2 hours as needed.  ?- Recommend staying in the shade or wearing long sleeves, sun glasses (UVA+UVB protection) and wide brim hats (4-inch brim around the entire circumference of the hat). ?- Call for new or changing lesions. ? 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 ?Area cleansed with Puracyn. Zilxi foam applied 1st, Xepi cream applied 2nd. Non stick telfa with paper tape applied. Samples of topicals given. ?Advised to cleanse with CLN facial cleanser, samples given. ?Keep scheduled appointment with Dr. Lacinda Axon at [facility] for evaluation for Mohs surgery in 6 days.  ? ?AK (actinic keratosis) ?Right Cheek x1 ?Actinic keratoses are precancerous spots that appear secondary to cumulative UV radiation exposure/sun exposure over time. They are chronic with expected duration over 1 year. A portion of actinic keratoses will progress to squamous cell carcinoma of the skin. It is not possible to reliably predict which spots will progress to skin cancer and so treatment is recommended to prevent development of skin cancer. ? ?Recommend daily broad spectrum sunscreen SPF 30+ to sun-exposed areas, reapply every 2 hours as needed.  ?Recommend staying in the shade or wearing long sleeves, sun glasses (UVA+UVB protection) and wide brim hats (4-inch brim around the entire circumference of the hat). ?Call for new or changing lesions. ? ?Destruction of lesion - Right Cheek x1 ?Complexity: simple   ?Destruction method: cryotherapy   ?Informed consent: discussed and consent obtained   ?Timeout:  patient name, date of birth, surgical site, and procedure verified ?Lesion destroyed using liquid nitrogen: Yes   ?Region frozen until ice ball extended beyond lesion: Yes   ?Outcome: patient tolerated procedure well with no complications   ?Post-procedure details: wound care instructions given   ? ?Inflamed seborrheic keratosis ?Right Cheek x1 ?Destruction of lesion - Right Cheek x1 ?Complexity: simple   ?Destruction method: cryotherapy   ?Informed consent: discussed and  consent obtained   ?Timeout:  patient name, date of birth, surgical site, and procedure verified ?Lesion destroyed using liquid nitrogen: Yes   ?Region  frozen until ice ball extended beyond lesion: Yes   ?Outcome: patient tolerated procedure well with no complications   ?Post-procedure details: wound care instructions given   ? ?Return for Follow Up As Scheduled. ? ?I, Emelia Salisbury, CMA, am acting as scribe for Sarina Ser, MD. ?Documentation: I have reviewed the above documentation for accuracy and completeness, and I agree with the above. ? ?Sarina Ser, MD ? ? ?

## 2022-04-01 NOTE — Telephone Encounter (Signed)
Pt daughter called, she would like for Dr Raliegh Ip to recheck [facility] surgery site, pt daughter think this area is infected, pt has an appt with Dr Lacinda Axon Monday 04-07-22  ?

## 2022-04-04 DIAGNOSIS — L97819 Non-pressure chronic ulcer of other part of right lower leg with unspecified severity: Secondary | ICD-10-CM | POA: Diagnosis not present

## 2022-04-04 DIAGNOSIS — I110 Hypertensive heart disease with heart failure: Secondary | ICD-10-CM | POA: Diagnosis not present

## 2022-04-04 DIAGNOSIS — I83028 Varicose veins of left lower extremity with ulcer other part of lower leg: Secondary | ICD-10-CM | POA: Diagnosis not present

## 2022-04-04 DIAGNOSIS — I4891 Unspecified atrial fibrillation: Secondary | ICD-10-CM | POA: Diagnosis not present

## 2022-04-04 DIAGNOSIS — I70222 Atherosclerosis of native arteries of extremities with rest pain, left leg: Secondary | ICD-10-CM | POA: Diagnosis not present

## 2022-04-04 DIAGNOSIS — I70212 Atherosclerosis of native arteries of extremities with intermittent claudication, left leg: Secondary | ICD-10-CM | POA: Diagnosis not present

## 2022-04-04 DIAGNOSIS — I83018 Varicose veins of right lower extremity with ulcer other part of lower leg: Secondary | ICD-10-CM | POA: Diagnosis not present

## 2022-04-04 DIAGNOSIS — I2510 Atherosclerotic heart disease of native coronary artery without angina pectoris: Secondary | ICD-10-CM | POA: Diagnosis not present

## 2022-04-04 DIAGNOSIS — L97822 Non-pressure chronic ulcer of other part of left lower leg with fat layer exposed: Secondary | ICD-10-CM | POA: Diagnosis not present

## 2022-04-07 DIAGNOSIS — C44329 Squamous cell carcinoma of skin of other parts of face: Secondary | ICD-10-CM | POA: Diagnosis not present

## 2022-04-08 DIAGNOSIS — I70222 Atherosclerosis of native arteries of extremities with rest pain, left leg: Secondary | ICD-10-CM | POA: Diagnosis not present

## 2022-04-08 DIAGNOSIS — L97819 Non-pressure chronic ulcer of other part of right lower leg with unspecified severity: Secondary | ICD-10-CM | POA: Diagnosis not present

## 2022-04-08 DIAGNOSIS — I70212 Atherosclerosis of native arteries of extremities with intermittent claudication, left leg: Secondary | ICD-10-CM | POA: Diagnosis not present

## 2022-04-08 DIAGNOSIS — I83018 Varicose veins of right lower extremity with ulcer other part of lower leg: Secondary | ICD-10-CM | POA: Diagnosis not present

## 2022-04-08 DIAGNOSIS — I110 Hypertensive heart disease with heart failure: Secondary | ICD-10-CM | POA: Diagnosis not present

## 2022-04-08 DIAGNOSIS — I2510 Atherosclerotic heart disease of native coronary artery without angina pectoris: Secondary | ICD-10-CM | POA: Diagnosis not present

## 2022-04-08 DIAGNOSIS — L97822 Non-pressure chronic ulcer of other part of left lower leg with fat layer exposed: Secondary | ICD-10-CM | POA: Diagnosis not present

## 2022-04-08 DIAGNOSIS — I4891 Unspecified atrial fibrillation: Secondary | ICD-10-CM | POA: Diagnosis not present

## 2022-04-08 DIAGNOSIS — I83028 Varicose veins of left lower extremity with ulcer other part of lower leg: Secondary | ICD-10-CM | POA: Diagnosis not present

## 2022-04-11 DIAGNOSIS — C4442 Squamous cell carcinoma of skin of scalp and neck: Secondary | ICD-10-CM | POA: Diagnosis not present

## 2022-04-12 DIAGNOSIS — I83028 Varicose veins of left lower extremity with ulcer other part of lower leg: Secondary | ICD-10-CM | POA: Diagnosis not present

## 2022-04-12 DIAGNOSIS — I70212 Atherosclerosis of native arteries of extremities with intermittent claudication, left leg: Secondary | ICD-10-CM | POA: Diagnosis not present

## 2022-04-12 DIAGNOSIS — I2510 Atherosclerotic heart disease of native coronary artery without angina pectoris: Secondary | ICD-10-CM | POA: Diagnosis not present

## 2022-04-12 DIAGNOSIS — I83018 Varicose veins of right lower extremity with ulcer other part of lower leg: Secondary | ICD-10-CM | POA: Diagnosis not present

## 2022-04-12 DIAGNOSIS — L97822 Non-pressure chronic ulcer of other part of left lower leg with fat layer exposed: Secondary | ICD-10-CM | POA: Diagnosis not present

## 2022-04-12 DIAGNOSIS — L97819 Non-pressure chronic ulcer of other part of right lower leg with unspecified severity: Secondary | ICD-10-CM | POA: Diagnosis not present

## 2022-04-12 DIAGNOSIS — I70222 Atherosclerosis of native arteries of extremities with rest pain, left leg: Secondary | ICD-10-CM | POA: Diagnosis not present

## 2022-04-12 DIAGNOSIS — I4891 Unspecified atrial fibrillation: Secondary | ICD-10-CM | POA: Diagnosis not present

## 2022-04-12 DIAGNOSIS — I110 Hypertensive heart disease with heart failure: Secondary | ICD-10-CM | POA: Diagnosis not present

## 2022-04-15 DIAGNOSIS — L97819 Non-pressure chronic ulcer of other part of right lower leg with unspecified severity: Secondary | ICD-10-CM | POA: Diagnosis not present

## 2022-04-15 DIAGNOSIS — I83018 Varicose veins of right lower extremity with ulcer other part of lower leg: Secondary | ICD-10-CM | POA: Diagnosis not present

## 2022-04-15 DIAGNOSIS — I70222 Atherosclerosis of native arteries of extremities with rest pain, left leg: Secondary | ICD-10-CM | POA: Diagnosis not present

## 2022-04-15 DIAGNOSIS — I110 Hypertensive heart disease with heart failure: Secondary | ICD-10-CM | POA: Diagnosis not present

## 2022-04-15 DIAGNOSIS — I83028 Varicose veins of left lower extremity with ulcer other part of lower leg: Secondary | ICD-10-CM | POA: Diagnosis not present

## 2022-04-15 DIAGNOSIS — L97822 Non-pressure chronic ulcer of other part of left lower leg with fat layer exposed: Secondary | ICD-10-CM | POA: Diagnosis not present

## 2022-04-15 DIAGNOSIS — I2510 Atherosclerotic heart disease of native coronary artery without angina pectoris: Secondary | ICD-10-CM | POA: Diagnosis not present

## 2022-04-15 DIAGNOSIS — I70212 Atherosclerosis of native arteries of extremities with intermittent claudication, left leg: Secondary | ICD-10-CM | POA: Diagnosis not present

## 2022-04-15 DIAGNOSIS — I4891 Unspecified atrial fibrillation: Secondary | ICD-10-CM | POA: Diagnosis not present

## 2022-04-16 ENCOUNTER — Telehealth: Payer: Self-pay

## 2022-04-16 NOTE — Telephone Encounter (Signed)
Per Dr. Jonathon Jordan progress notes patient has been referral to Dr. Truman Hayward in Otolaryngology to discuss wide local excision and superficial parotidectomy.  ? ?Copy of note in media. aw  ?

## 2022-04-18 DIAGNOSIS — S40022A Contusion of left upper arm, initial encounter: Secondary | ICD-10-CM | POA: Diagnosis not present

## 2022-04-18 DIAGNOSIS — I509 Heart failure, unspecified: Secondary | ICD-10-CM | POA: Diagnosis not present

## 2022-04-18 DIAGNOSIS — N189 Chronic kidney disease, unspecified: Secondary | ICD-10-CM | POA: Diagnosis not present

## 2022-04-19 DIAGNOSIS — L97822 Non-pressure chronic ulcer of other part of left lower leg with fat layer exposed: Secondary | ICD-10-CM | POA: Diagnosis not present

## 2022-04-19 DIAGNOSIS — I70212 Atherosclerosis of native arteries of extremities with intermittent claudication, left leg: Secondary | ICD-10-CM | POA: Diagnosis not present

## 2022-04-19 DIAGNOSIS — I2510 Atherosclerotic heart disease of native coronary artery without angina pectoris: Secondary | ICD-10-CM | POA: Diagnosis not present

## 2022-04-19 DIAGNOSIS — I110 Hypertensive heart disease with heart failure: Secondary | ICD-10-CM | POA: Diagnosis not present

## 2022-04-19 DIAGNOSIS — I83018 Varicose veins of right lower extremity with ulcer other part of lower leg: Secondary | ICD-10-CM | POA: Diagnosis not present

## 2022-04-19 DIAGNOSIS — I70222 Atherosclerosis of native arteries of extremities with rest pain, left leg: Secondary | ICD-10-CM | POA: Diagnosis not present

## 2022-04-19 DIAGNOSIS — I4891 Unspecified atrial fibrillation: Secondary | ICD-10-CM | POA: Diagnosis not present

## 2022-04-19 DIAGNOSIS — L97819 Non-pressure chronic ulcer of other part of right lower leg with unspecified severity: Secondary | ICD-10-CM | POA: Diagnosis not present

## 2022-04-19 DIAGNOSIS — I83028 Varicose veins of left lower extremity with ulcer other part of lower leg: Secondary | ICD-10-CM | POA: Diagnosis not present

## 2022-04-20 DIAGNOSIS — S51812A Laceration without foreign body of left forearm, initial encounter: Secondary | ICD-10-CM | POA: Diagnosis not present

## 2022-04-22 DIAGNOSIS — L97822 Non-pressure chronic ulcer of other part of left lower leg with fat layer exposed: Secondary | ICD-10-CM | POA: Diagnosis not present

## 2022-04-22 DIAGNOSIS — I110 Hypertensive heart disease with heart failure: Secondary | ICD-10-CM | POA: Diagnosis not present

## 2022-04-22 DIAGNOSIS — I2510 Atherosclerotic heart disease of native coronary artery without angina pectoris: Secondary | ICD-10-CM | POA: Diagnosis not present

## 2022-04-22 DIAGNOSIS — L97819 Non-pressure chronic ulcer of other part of right lower leg with unspecified severity: Secondary | ICD-10-CM | POA: Diagnosis not present

## 2022-04-22 DIAGNOSIS — I70222 Atherosclerosis of native arteries of extremities with rest pain, left leg: Secondary | ICD-10-CM | POA: Diagnosis not present

## 2022-04-22 DIAGNOSIS — I70212 Atherosclerosis of native arteries of extremities with intermittent claudication, left leg: Secondary | ICD-10-CM | POA: Diagnosis not present

## 2022-04-22 DIAGNOSIS — I83018 Varicose veins of right lower extremity with ulcer other part of lower leg: Secondary | ICD-10-CM | POA: Diagnosis not present

## 2022-04-22 DIAGNOSIS — I4891 Unspecified atrial fibrillation: Secondary | ICD-10-CM | POA: Diagnosis not present

## 2022-04-22 DIAGNOSIS — I83028 Varicose veins of left lower extremity with ulcer other part of lower leg: Secondary | ICD-10-CM | POA: Diagnosis not present

## 2022-04-23 DIAGNOSIS — S51812A Laceration without foreign body of left forearm, initial encounter: Secondary | ICD-10-CM | POA: Diagnosis not present

## 2022-04-23 DIAGNOSIS — R5383 Other fatigue: Secondary | ICD-10-CM | POA: Diagnosis not present

## 2022-04-23 DIAGNOSIS — I5023 Acute on chronic systolic (congestive) heart failure: Secondary | ICD-10-CM | POA: Diagnosis not present

## 2022-04-24 DIAGNOSIS — E876 Hypokalemia: Secondary | ICD-10-CM | POA: Diagnosis not present

## 2022-04-24 DIAGNOSIS — Z713 Dietary counseling and surveillance: Secondary | ICD-10-CM | POA: Diagnosis not present

## 2022-04-24 DIAGNOSIS — I872 Venous insufficiency (chronic) (peripheral): Secondary | ICD-10-CM | POA: Diagnosis not present

## 2022-04-24 DIAGNOSIS — I2510 Atherosclerotic heart disease of native coronary artery without angina pectoris: Secondary | ICD-10-CM | POA: Diagnosis not present

## 2022-04-24 DIAGNOSIS — E1151 Type 2 diabetes mellitus with diabetic peripheral angiopathy without gangrene: Secondary | ICD-10-CM | POA: Diagnosis not present

## 2022-04-24 DIAGNOSIS — Z1331 Encounter for screening for depression: Secondary | ICD-10-CM | POA: Diagnosis not present

## 2022-04-24 DIAGNOSIS — I10 Essential (primary) hypertension: Secondary | ICD-10-CM | POA: Diagnosis not present

## 2022-04-24 DIAGNOSIS — C4442 Squamous cell carcinoma of skin of scalp and neck: Secondary | ICD-10-CM | POA: Diagnosis not present

## 2022-04-24 DIAGNOSIS — Z01818 Encounter for other preprocedural examination: Secondary | ICD-10-CM | POA: Diagnosis not present

## 2022-04-24 DIAGNOSIS — R4189 Other symptoms and signs involving cognitive functions and awareness: Secondary | ICD-10-CM | POA: Diagnosis not present

## 2022-04-24 DIAGNOSIS — N183 Chronic kidney disease, stage 3 unspecified: Secondary | ICD-10-CM | POA: Diagnosis not present

## 2022-04-24 DIAGNOSIS — I420 Dilated cardiomyopathy: Secondary | ICD-10-CM | POA: Diagnosis not present

## 2022-04-25 DIAGNOSIS — I83028 Varicose veins of left lower extremity with ulcer other part of lower leg: Secondary | ICD-10-CM | POA: Diagnosis not present

## 2022-04-25 DIAGNOSIS — I110 Hypertensive heart disease with heart failure: Secondary | ICD-10-CM | POA: Diagnosis not present

## 2022-04-25 DIAGNOSIS — I2510 Atherosclerotic heart disease of native coronary artery without angina pectoris: Secondary | ICD-10-CM | POA: Diagnosis not present

## 2022-04-25 DIAGNOSIS — I70222 Atherosclerosis of native arteries of extremities with rest pain, left leg: Secondary | ICD-10-CM | POA: Diagnosis not present

## 2022-04-25 DIAGNOSIS — L97819 Non-pressure chronic ulcer of other part of right lower leg with unspecified severity: Secondary | ICD-10-CM | POA: Diagnosis not present

## 2022-04-25 DIAGNOSIS — I4891 Unspecified atrial fibrillation: Secondary | ICD-10-CM | POA: Diagnosis not present

## 2022-04-25 DIAGNOSIS — I70212 Atherosclerosis of native arteries of extremities with intermittent claudication, left leg: Secondary | ICD-10-CM | POA: Diagnosis not present

## 2022-04-25 DIAGNOSIS — L97822 Non-pressure chronic ulcer of other part of left lower leg with fat layer exposed: Secondary | ICD-10-CM | POA: Diagnosis not present

## 2022-04-25 DIAGNOSIS — I83018 Varicose veins of right lower extremity with ulcer other part of lower leg: Secondary | ICD-10-CM | POA: Diagnosis not present

## 2022-04-28 DIAGNOSIS — I110 Hypertensive heart disease with heart failure: Secondary | ICD-10-CM | POA: Diagnosis not present

## 2022-04-28 DIAGNOSIS — I70212 Atherosclerosis of native arteries of extremities with intermittent claudication, left leg: Secondary | ICD-10-CM | POA: Diagnosis not present

## 2022-04-28 DIAGNOSIS — I4891 Unspecified atrial fibrillation: Secondary | ICD-10-CM | POA: Diagnosis not present

## 2022-04-28 DIAGNOSIS — I83018 Varicose veins of right lower extremity with ulcer other part of lower leg: Secondary | ICD-10-CM | POA: Diagnosis not present

## 2022-04-28 DIAGNOSIS — C4442 Squamous cell carcinoma of skin of scalp and neck: Secondary | ICD-10-CM | POA: Diagnosis not present

## 2022-04-28 DIAGNOSIS — I70222 Atherosclerosis of native arteries of extremities with rest pain, left leg: Secondary | ICD-10-CM | POA: Diagnosis not present

## 2022-04-28 DIAGNOSIS — L97822 Non-pressure chronic ulcer of other part of left lower leg with fat layer exposed: Secondary | ICD-10-CM | POA: Diagnosis not present

## 2022-04-28 DIAGNOSIS — I2510 Atherosclerotic heart disease of native coronary artery without angina pectoris: Secondary | ICD-10-CM | POA: Diagnosis not present

## 2022-04-28 DIAGNOSIS — L97819 Non-pressure chronic ulcer of other part of right lower leg with unspecified severity: Secondary | ICD-10-CM | POA: Diagnosis not present

## 2022-04-28 DIAGNOSIS — I83028 Varicose veins of left lower extremity with ulcer other part of lower leg: Secondary | ICD-10-CM | POA: Diagnosis not present

## 2022-04-30 DIAGNOSIS — D72828 Other elevated white blood cell count: Secondary | ICD-10-CM | POA: Diagnosis not present

## 2022-04-30 DIAGNOSIS — I4891 Unspecified atrial fibrillation: Secondary | ICD-10-CM | POA: Diagnosis not present

## 2022-04-30 DIAGNOSIS — I420 Dilated cardiomyopathy: Secondary | ICD-10-CM | POA: Diagnosis not present

## 2022-04-30 DIAGNOSIS — I739 Peripheral vascular disease, unspecified: Secondary | ICD-10-CM | POA: Diagnosis not present

## 2022-04-30 DIAGNOSIS — I2510 Atherosclerotic heart disease of native coronary artery without angina pectoris: Secondary | ICD-10-CM | POA: Diagnosis not present

## 2022-04-30 DIAGNOSIS — I2609 Other pulmonary embolism with acute cor pulmonale: Secondary | ICD-10-CM | POA: Diagnosis not present

## 2022-04-30 DIAGNOSIS — I10 Essential (primary) hypertension: Secondary | ICD-10-CM | POA: Diagnosis not present

## 2022-04-30 DIAGNOSIS — E876 Hypokalemia: Secondary | ICD-10-CM | POA: Diagnosis not present

## 2022-04-30 DIAGNOSIS — R718 Other abnormality of red blood cells: Secondary | ICD-10-CM | POA: Diagnosis not present

## 2022-04-30 DIAGNOSIS — R002 Palpitations: Secondary | ICD-10-CM | POA: Diagnosis not present

## 2022-04-30 DIAGNOSIS — Z951 Presence of aortocoronary bypass graft: Secondary | ICD-10-CM | POA: Diagnosis not present

## 2022-04-30 DIAGNOSIS — I5023 Acute on chronic systolic (congestive) heart failure: Secondary | ICD-10-CM | POA: Diagnosis not present

## 2022-05-01 DIAGNOSIS — I70212 Atherosclerosis of native arteries of extremities with intermittent claudication, left leg: Secondary | ICD-10-CM | POA: Diagnosis not present

## 2022-05-01 DIAGNOSIS — I83028 Varicose veins of left lower extremity with ulcer other part of lower leg: Secondary | ICD-10-CM | POA: Diagnosis not present

## 2022-05-01 DIAGNOSIS — L97822 Non-pressure chronic ulcer of other part of left lower leg with fat layer exposed: Secondary | ICD-10-CM | POA: Diagnosis not present

## 2022-05-01 DIAGNOSIS — I4891 Unspecified atrial fibrillation: Secondary | ICD-10-CM | POA: Diagnosis not present

## 2022-05-01 DIAGNOSIS — I70222 Atherosclerosis of native arteries of extremities with rest pain, left leg: Secondary | ICD-10-CM | POA: Diagnosis not present

## 2022-05-01 DIAGNOSIS — I2510 Atherosclerotic heart disease of native coronary artery without angina pectoris: Secondary | ICD-10-CM | POA: Diagnosis not present

## 2022-05-01 DIAGNOSIS — I83018 Varicose veins of right lower extremity with ulcer other part of lower leg: Secondary | ICD-10-CM | POA: Diagnosis not present

## 2022-05-01 DIAGNOSIS — L97819 Non-pressure chronic ulcer of other part of right lower leg with unspecified severity: Secondary | ICD-10-CM | POA: Diagnosis not present

## 2022-05-01 DIAGNOSIS — I110 Hypertensive heart disease with heart failure: Secondary | ICD-10-CM | POA: Diagnosis not present

## 2022-05-02 DIAGNOSIS — N183 Chronic kidney disease, stage 3 unspecified: Secondary | ICD-10-CM | POA: Diagnosis not present

## 2022-05-02 DIAGNOSIS — Z9189 Other specified personal risk factors, not elsewhere classified: Secondary | ICD-10-CM | POA: Diagnosis not present

## 2022-05-02 DIAGNOSIS — G8918 Other acute postprocedural pain: Secondary | ICD-10-CM | POA: Diagnosis not present

## 2022-05-02 DIAGNOSIS — G4700 Insomnia, unspecified: Secondary | ICD-10-CM | POA: Diagnosis not present

## 2022-05-02 DIAGNOSIS — Z9181 History of falling: Secondary | ICD-10-CM | POA: Diagnosis not present

## 2022-05-02 DIAGNOSIS — I5022 Chronic systolic (congestive) heart failure: Secondary | ICD-10-CM | POA: Diagnosis not present

## 2022-05-02 DIAGNOSIS — E039 Hypothyroidism, unspecified: Secondary | ICD-10-CM | POA: Diagnosis not present

## 2022-05-02 DIAGNOSIS — E1122 Type 2 diabetes mellitus with diabetic chronic kidney disease: Secondary | ICD-10-CM | POA: Diagnosis not present

## 2022-05-02 DIAGNOSIS — I10 Essential (primary) hypertension: Secondary | ICD-10-CM | POA: Diagnosis not present

## 2022-05-02 DIAGNOSIS — I5020 Unspecified systolic (congestive) heart failure: Secondary | ICD-10-CM | POA: Diagnosis not present

## 2022-05-02 DIAGNOSIS — C770 Secondary and unspecified malignant neoplasm of lymph nodes of head, face and neck: Secondary | ICD-10-CM | POA: Diagnosis not present

## 2022-05-02 DIAGNOSIS — C07 Malignant neoplasm of parotid gland: Secondary | ICD-10-CM | POA: Diagnosis not present

## 2022-05-02 DIAGNOSIS — I130 Hypertensive heart and chronic kidney disease with heart failure and stage 1 through stage 4 chronic kidney disease, or unspecified chronic kidney disease: Secondary | ICD-10-CM | POA: Diagnosis not present

## 2022-05-02 DIAGNOSIS — D490 Neoplasm of unspecified behavior of digestive system: Secondary | ICD-10-CM | POA: Diagnosis not present

## 2022-05-02 DIAGNOSIS — I872 Venous insufficiency (chronic) (peripheral): Secondary | ICD-10-CM | POA: Diagnosis not present

## 2022-05-02 DIAGNOSIS — D509 Iron deficiency anemia, unspecified: Secondary | ICD-10-CM | POA: Diagnosis not present

## 2022-05-02 DIAGNOSIS — I420 Dilated cardiomyopathy: Secondary | ICD-10-CM | POA: Diagnosis not present

## 2022-05-04 DIAGNOSIS — D490 Neoplasm of unspecified behavior of digestive system: Secondary | ICD-10-CM | POA: Diagnosis not present

## 2022-05-05 DIAGNOSIS — I10 Essential (primary) hypertension: Secondary | ICD-10-CM | POA: Diagnosis not present

## 2022-05-05 DIAGNOSIS — G4700 Insomnia, unspecified: Secondary | ICD-10-CM | POA: Diagnosis not present

## 2022-05-05 DIAGNOSIS — Z9189 Other specified personal risk factors, not elsewhere classified: Secondary | ICD-10-CM | POA: Diagnosis not present

## 2022-05-05 DIAGNOSIS — E039 Hypothyroidism, unspecified: Secondary | ICD-10-CM | POA: Diagnosis not present

## 2022-05-05 DIAGNOSIS — Z9181 History of falling: Secondary | ICD-10-CM | POA: Diagnosis not present

## 2022-05-05 DIAGNOSIS — D490 Neoplasm of unspecified behavior of digestive system: Secondary | ICD-10-CM | POA: Diagnosis not present

## 2022-05-05 DIAGNOSIS — I872 Venous insufficiency (chronic) (peripheral): Secondary | ICD-10-CM | POA: Diagnosis not present

## 2022-05-05 DIAGNOSIS — G8918 Other acute postprocedural pain: Secondary | ICD-10-CM | POA: Diagnosis not present

## 2022-05-05 DIAGNOSIS — I5020 Unspecified systolic (congestive) heart failure: Secondary | ICD-10-CM | POA: Diagnosis not present

## 2022-05-06 DIAGNOSIS — D490 Neoplasm of unspecified behavior of digestive system: Secondary | ICD-10-CM | POA: Diagnosis not present

## 2022-05-06 DIAGNOSIS — G4700 Insomnia, unspecified: Secondary | ICD-10-CM | POA: Diagnosis not present

## 2022-05-06 DIAGNOSIS — G8918 Other acute postprocedural pain: Secondary | ICD-10-CM | POA: Diagnosis not present

## 2022-05-06 DIAGNOSIS — Z9181 History of falling: Secondary | ICD-10-CM | POA: Diagnosis not present

## 2022-05-06 DIAGNOSIS — Z9189 Other specified personal risk factors, not elsewhere classified: Secondary | ICD-10-CM | POA: Diagnosis not present

## 2022-05-06 DIAGNOSIS — I10 Essential (primary) hypertension: Secondary | ICD-10-CM | POA: Diagnosis not present

## 2022-05-06 DIAGNOSIS — I5020 Unspecified systolic (congestive) heart failure: Secondary | ICD-10-CM | POA: Diagnosis not present

## 2022-05-06 DIAGNOSIS — E039 Hypothyroidism, unspecified: Secondary | ICD-10-CM | POA: Diagnosis not present

## 2022-05-06 DIAGNOSIS — I872 Venous insufficiency (chronic) (peripheral): Secondary | ICD-10-CM | POA: Diagnosis not present

## 2022-05-09 DIAGNOSIS — I739 Peripheral vascular disease, unspecified: Secondary | ICD-10-CM | POA: Diagnosis not present

## 2022-05-09 DIAGNOSIS — I5023 Acute on chronic systolic (congestive) heart failure: Secondary | ICD-10-CM | POA: Diagnosis not present

## 2022-05-09 DIAGNOSIS — Z79899 Other long term (current) drug therapy: Secondary | ICD-10-CM | POA: Diagnosis not present

## 2022-05-09 DIAGNOSIS — D490 Neoplasm of unspecified behavior of digestive system: Secondary | ICD-10-CM | POA: Diagnosis not present

## 2022-05-09 DIAGNOSIS — D110 Benign neoplasm of parotid gland: Secondary | ICD-10-CM | POA: Diagnosis not present

## 2022-05-09 DIAGNOSIS — I5020 Unspecified systolic (congestive) heart failure: Secondary | ICD-10-CM | POA: Diagnosis not present

## 2022-05-09 DIAGNOSIS — T148XXD Other injury of unspecified body region, subsequent encounter: Secondary | ICD-10-CM | POA: Diagnosis not present

## 2022-05-10 DIAGNOSIS — I70212 Atherosclerosis of native arteries of extremities with intermittent claudication, left leg: Secondary | ICD-10-CM | POA: Diagnosis not present

## 2022-05-10 DIAGNOSIS — L97819 Non-pressure chronic ulcer of other part of right lower leg with unspecified severity: Secondary | ICD-10-CM | POA: Diagnosis not present

## 2022-05-10 DIAGNOSIS — L97822 Non-pressure chronic ulcer of other part of left lower leg with fat layer exposed: Secondary | ICD-10-CM | POA: Diagnosis not present

## 2022-05-10 DIAGNOSIS — I2510 Atherosclerotic heart disease of native coronary artery without angina pectoris: Secondary | ICD-10-CM | POA: Diagnosis not present

## 2022-05-10 DIAGNOSIS — I70222 Atherosclerosis of native arteries of extremities with rest pain, left leg: Secondary | ICD-10-CM | POA: Diagnosis not present

## 2022-05-10 DIAGNOSIS — I4891 Unspecified atrial fibrillation: Secondary | ICD-10-CM | POA: Diagnosis not present

## 2022-05-10 DIAGNOSIS — I83018 Varicose veins of right lower extremity with ulcer other part of lower leg: Secondary | ICD-10-CM | POA: Diagnosis not present

## 2022-05-10 DIAGNOSIS — I110 Hypertensive heart disease with heart failure: Secondary | ICD-10-CM | POA: Diagnosis not present

## 2022-05-10 DIAGNOSIS — I83028 Varicose veins of left lower extremity with ulcer other part of lower leg: Secondary | ICD-10-CM | POA: Diagnosis not present

## 2022-05-13 DIAGNOSIS — I70212 Atherosclerosis of native arteries of extremities with intermittent claudication, left leg: Secondary | ICD-10-CM | POA: Diagnosis not present

## 2022-05-13 DIAGNOSIS — I110 Hypertensive heart disease with heart failure: Secondary | ICD-10-CM | POA: Diagnosis not present

## 2022-05-13 DIAGNOSIS — I2510 Atherosclerotic heart disease of native coronary artery without angina pectoris: Secondary | ICD-10-CM | POA: Diagnosis not present

## 2022-05-13 DIAGNOSIS — I4891 Unspecified atrial fibrillation: Secondary | ICD-10-CM | POA: Diagnosis not present

## 2022-05-13 DIAGNOSIS — I83028 Varicose veins of left lower extremity with ulcer other part of lower leg: Secondary | ICD-10-CM | POA: Diagnosis not present

## 2022-05-13 DIAGNOSIS — L97822 Non-pressure chronic ulcer of other part of left lower leg with fat layer exposed: Secondary | ICD-10-CM | POA: Diagnosis not present

## 2022-05-13 DIAGNOSIS — L97819 Non-pressure chronic ulcer of other part of right lower leg with unspecified severity: Secondary | ICD-10-CM | POA: Diagnosis not present

## 2022-05-13 DIAGNOSIS — I70222 Atherosclerosis of native arteries of extremities with rest pain, left leg: Secondary | ICD-10-CM | POA: Diagnosis not present

## 2022-05-13 DIAGNOSIS — I83018 Varicose veins of right lower extremity with ulcer other part of lower leg: Secondary | ICD-10-CM | POA: Diagnosis not present

## 2022-05-16 DIAGNOSIS — I70222 Atherosclerosis of native arteries of extremities with rest pain, left leg: Secondary | ICD-10-CM | POA: Diagnosis not present

## 2022-05-16 DIAGNOSIS — I83018 Varicose veins of right lower extremity with ulcer other part of lower leg: Secondary | ICD-10-CM | POA: Diagnosis not present

## 2022-05-16 DIAGNOSIS — I83028 Varicose veins of left lower extremity with ulcer other part of lower leg: Secondary | ICD-10-CM | POA: Diagnosis not present

## 2022-05-16 DIAGNOSIS — I4891 Unspecified atrial fibrillation: Secondary | ICD-10-CM | POA: Diagnosis not present

## 2022-05-16 DIAGNOSIS — I2510 Atherosclerotic heart disease of native coronary artery without angina pectoris: Secondary | ICD-10-CM | POA: Diagnosis not present

## 2022-05-16 DIAGNOSIS — L97822 Non-pressure chronic ulcer of other part of left lower leg with fat layer exposed: Secondary | ICD-10-CM | POA: Diagnosis not present

## 2022-05-16 DIAGNOSIS — I110 Hypertensive heart disease with heart failure: Secondary | ICD-10-CM | POA: Diagnosis not present

## 2022-05-16 DIAGNOSIS — I70212 Atherosclerosis of native arteries of extremities with intermittent claudication, left leg: Secondary | ICD-10-CM | POA: Diagnosis not present

## 2022-05-16 DIAGNOSIS — L97819 Non-pressure chronic ulcer of other part of right lower leg with unspecified severity: Secondary | ICD-10-CM | POA: Diagnosis not present

## 2022-05-19 ENCOUNTER — Ambulatory Visit
Admission: RE | Admit: 2022-05-19 | Discharge: 2022-05-19 | Disposition: A | Discharge status: Home / Self Care | Payer: Medicare HMO | Source: Ambulatory Visit | Attending: Radiation Oncology | Admitting: Radiation Oncology

## 2022-05-19 ENCOUNTER — Encounter: Payer: Self-pay | Admitting: Radiation Oncology

## 2022-05-19 VITALS — BP 104/56 | HR 65 | Temp 97.8°F | Resp 16 | Wt 150.8 lb

## 2022-05-19 DIAGNOSIS — I4891 Unspecified atrial fibrillation: Secondary | ICD-10-CM | POA: Diagnosis not present

## 2022-05-19 DIAGNOSIS — D509 Iron deficiency anemia, unspecified: Secondary | ICD-10-CM | POA: Insufficient documentation

## 2022-05-19 DIAGNOSIS — Z7984 Long term (current) use of oral hypoglycemic drugs: Secondary | ICD-10-CM | POA: Diagnosis not present

## 2022-05-19 DIAGNOSIS — Z7901 Long term (current) use of anticoagulants: Secondary | ICD-10-CM | POA: Diagnosis not present

## 2022-05-19 DIAGNOSIS — C4442 Squamous cell carcinoma of skin of scalp and neck: Secondary | ICD-10-CM | POA: Diagnosis not present

## 2022-05-19 DIAGNOSIS — I10 Essential (primary) hypertension: Secondary | ICD-10-CM | POA: Diagnosis not present

## 2022-05-19 DIAGNOSIS — Z7982 Long term (current) use of aspirin: Secondary | ICD-10-CM | POA: Insufficient documentation

## 2022-05-19 DIAGNOSIS — S0990XA Unspecified injury of head, initial encounter: Secondary | ICD-10-CM

## 2022-05-19 DIAGNOSIS — C770 Secondary and unspecified malignant neoplasm of lymph nodes of head, face and neck: Secondary | ICD-10-CM | POA: Insufficient documentation

## 2022-05-19 DIAGNOSIS — E079 Disorder of thyroid, unspecified: Secondary | ICD-10-CM | POA: Diagnosis not present

## 2022-05-19 DIAGNOSIS — E119 Type 2 diabetes mellitus without complications: Secondary | ICD-10-CM | POA: Insufficient documentation

## 2022-05-19 DIAGNOSIS — I2510 Atherosclerotic heart disease of native coronary artery without angina pectoris: Secondary | ICD-10-CM | POA: Insufficient documentation

## 2022-05-19 DIAGNOSIS — E538 Deficiency of other specified B group vitamins: Secondary | ICD-10-CM | POA: Insufficient documentation

## 2022-05-19 DIAGNOSIS — C07 Malignant neoplasm of parotid gland: Secondary | ICD-10-CM

## 2022-05-19 DIAGNOSIS — Z79899 Other long term (current) drug therapy: Secondary | ICD-10-CM | POA: Insufficient documentation

## 2022-05-19 NOTE — Consult Note (Signed)
NEW PATIENT EVALUATION  Name: [name] [name]  MRN: [id number]  Date:   05/19/2022     DOB: 10-25-1929   This 88 y.o. male patient presents to the clinic for initial evaluation of squamous cell carcinoma of the right neck status post resection at [facility] with 1 out of 3 nodes positive.  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 A diagnosis of Headache due to injury of head and neck was also pertinent to this visit.   PREVIOUS INVESTIGATIONS:  Pathology reports reviewed PET CT scan ordered Clinical notes reviewed  HPI: Patient is a 88 year old male who is now seen referred from [facility] after undergoing a wide local excision of a right preauricular lesion with superficial peridectomy and right neck dissection.  He has been under Dr. Heywood Footman care was noted to have a recurrent lesion in this area biopsy was positive for squamous cell carcinoma.  He underwent resection at [facility] with margins clear although 1 of 2 lymph nodes dissected had a 0.4 cm area of metastatic carcinoma with extranodal extension absent.  The parotid gland have a 2.3 cm area of poorly differentiated squamous cell carcinoma negative for perineural lymph-vascular invasion.  20 other lymph nodes were negative for metastatic disease.  Patient has been slow to heal has developed postoperative infection although that seems to be resolving.  He is now referred to ration collagen for consideration of treatment.  PLANNED TREATMENT REGIMEN: Possible right parotid bed and right neck radiation  PAST MEDICAL HISTORY:  has a past medical history of A-fib ([place]), B12 deficiency, Coronary artery disease, HTN (hypertension), benign (04/25/2014), Iron deficiency anemia, Squamous cell carcinoma of skin (11/18/2021), Squamous cell carcinoma of skin (03/18/2022),  Thyroid disease, and Type 2 diabetes mellitus with peripheral angiopathy ([place]) (11/10/2018).    PAST SURGICAL HISTORY:  Past Surgical History:  Procedure Laterality Date   CARDIAC SURGERY     CABG 1991   HERNIA REPAIR     LOWER EXTREMITY ANGIOGRAPHY Left 08/30/2021   Procedure: LOWER EXTREMITY ANGIOGRAPHY;  Surgeon: Katha Cabal, MD;  Location: [facility];  Service: Cardiovascular;  Laterality: Left;    FAMILY HISTORY: family history includes Diabetes in his brother; Heart attack in his brother; Hypertension in his mother; Stroke in his brother.  SOCIAL HISTORY:  reports that he has never smoked. He has never used smokeless tobacco. He reports that he does not drink alcohol and does not use drugs.  ALLERGIES: Cephalexin, Spironolactone, Codeine, and Doxycycline  MEDICATIONS:  Current Outpatient Medications  Medication Sig Dispense Refill   acetaminophen (TYLENOL) 500 MG tablet Take 1,000 mg by mouth every 6 (six) hours as needed for moderate pain.     apixaban (ELIQUIS) 5 MG TABS tablet Take 5 mg by mouth 2 (two) times daily.     aspirin EC 81 MG EC tablet Take 1 tablet (81 mg total) by mouth daily. Swallow whole. 30 tablet 11   cetirizine (ZYRTEC) 10 MG tablet Take 10 mg by mouth daily as needed for allergies.     Cyanocobalamin (VITAMIN B-12) 1000 MCG SUBL Take 1,000 mcg by mouth daily.     empagliflozin (JARDIANCE) 10 MG TABS tablet Take 1 tablet by mouth daily.     ferrous sulfate 325 (65 FE) MG EC tablet Take 325 mg by mouth daily.  levothyroxine (SYNTHROID, LEVOTHROID) 112 MCG tablet Take 112 mcg by mouth daily.     metoprolol succinate (TOPROL-XL) 50 MG 24 hr tablet Take 50 mg by mouth daily.     Multiple Vitamins-Minerals (MULTIVITAMIN WITH MINERALS) tablet Take 1 tablet by mouth daily.     Omega-3 Fatty Acids (FISH OIL) 1000 MG CAPS Take 1,000 mg by mouth in the morning and at bedtime.     omeprazole (PRILOSEC) 20 MG capsule Take 20 mg by mouth daily.      simvastatin (ZOCOR) 40 MG tablet Take 40 mg by mouth daily.     torsemide (DEMADEX) 20 MG tablet Take 40 mg by mouth daily.     polyethylene glycol (MIRALAX / GLYCOLAX) packet Take 17 g by mouth daily. Mix one tablespoon with 8oz of your favorite juice or water every day until you are having soft formed stools. Then start taking once daily if you didn't have a stool the day before. (Patient not taking: Reported on 05/19/2022) 30 each 0   No current facility-administered medications for this encounter.    ECOG PERFORMANCE STATUS:  0 - Asymptomatic  REVIEW OF SYSTEMS: Patient denies any weight loss, fatigue, weakness, fever, chills or night sweats. Patient denies any loss of vision, blurred vision. Patient denies any ringing  of the ears or hearing loss. No irregular heartbeat. Patient denies heart murmur or history of fainting. Patient denies any chest pain or pain radiating to her upper extremities. Patient denies any shortness of breath, difficulty breathing at night, cough or hemoptysis. Patient denies any swelling in the lower legs. Patient denies any nausea vomiting, vomiting of blood, or coffee ground material in the vomitus. Patient denies any stomach pain. Patient states has had normal bowel movements no significant constipation or diarrhea. Patient denies any dysuria, hematuria or significant nocturia. Patient denies any problems walking, swelling in the joints or loss of balance. Patient denies any skin changes, loss of hair or loss of weight. Patient denies any excessive worrying or anxiety or significant depression. Patient denies any problems with insomnia. Patient denies excessive thirst, polyuria, polydipsia. Patient denies any swollen glands, patient denies easy bruising or easy bleeding. Patient denies any recent infections, allergies or URI. Patient "s visual fields have not changed significantly in recent time.   PHYSICAL EXAM: BP (!) 104/56 (BP Location: Left Wrist, Patient Position:  Sitting, Cuff Size: Small)   Pulse 65   Temp 97.8 F (36.6 C) (Tympanic)   Resp 16   Wt 150 lb 12.8 oz (68.4 kg)   BMI 22.93 kg/m  Elderly male wheelchair-bound in NAD.  Has a recent incision of the right neck which is healing well.  No adenopathy in the head and neck region is noted.  Preauricular areas also hole healing slowly with granulation tissue present.  Well-developed well-nourished patient in NAD. HEENT reveals PERLA, EOMI, discs not visualized.  Oral cavity is clear. No oral mucosal lesions are identified. Neck is clear without evidence of cervical or supraclavicular adenopathy. Lungs are clear to A&P. Cardiac examination is essentially unremarkable with regular rate and rhythm without murmur rub or thrill. Abdomen is benign with no organomegaly or masses noted. Motor sensory and DTR levels are equal and symmetric in the upper and lower extremities. Cranial nerves II through XII are grossly intact. Proprioception is intact. No peripheral adenopathy or edema is identified. No motor or sensory levels are noted. Crude visual fields are within normal range.  LABORATORY DATA: Pathology reports reviewed    RADIOLOGY RESULTS:  PET CT scan ordered   IMPRESSION: Locally advanced squamous cell carcinoma of the right preauricular region with involvement of the parotid gland as well as 1 cervical lymph node in 88 year old male  PLAN: This time ordered a PET CT scan to evaluate for any possible residual disease as well as any possible other neck nodes involved.  Should that be negative would plan on radiation therapy to his right parotid bed and right neck 50 Gray over 5 weeks to eradicate any residual microscopic residual disease.  Risks and benefits of treatment including possible dysphagia secondary to slight radiation esophagitis fatigue skin reaction alteration of blood counts all were discussed in detail with the patient and her and his family.  They all seem to comprehend my treatment plan  well.  I would like to take this opportunity to thank you for allowing me to participate in the care of your patient.Noreene Filbert, MD

## 2022-05-20 ENCOUNTER — Institutional Professional Consult (permissible substitution): Payer: Medicare HMO | Admitting: Radiation Oncology

## 2022-05-20 DIAGNOSIS — I83028 Varicose veins of left lower extremity with ulcer other part of lower leg: Secondary | ICD-10-CM | POA: Diagnosis not present

## 2022-05-20 DIAGNOSIS — I70212 Atherosclerosis of native arteries of extremities with intermittent claudication, left leg: Secondary | ICD-10-CM | POA: Diagnosis not present

## 2022-05-20 DIAGNOSIS — I2510 Atherosclerotic heart disease of native coronary artery without angina pectoris: Secondary | ICD-10-CM | POA: Diagnosis not present

## 2022-05-20 DIAGNOSIS — I70222 Atherosclerosis of native arteries of extremities with rest pain, left leg: Secondary | ICD-10-CM | POA: Diagnosis not present

## 2022-05-20 DIAGNOSIS — L97822 Non-pressure chronic ulcer of other part of left lower leg with fat layer exposed: Secondary | ICD-10-CM | POA: Diagnosis not present

## 2022-05-20 DIAGNOSIS — I4891 Unspecified atrial fibrillation: Secondary | ICD-10-CM | POA: Diagnosis not present

## 2022-05-20 DIAGNOSIS — I83018 Varicose veins of right lower extremity with ulcer other part of lower leg: Secondary | ICD-10-CM | POA: Diagnosis not present

## 2022-05-20 DIAGNOSIS — I110 Hypertensive heart disease with heart failure: Secondary | ICD-10-CM | POA: Diagnosis not present

## 2022-05-20 DIAGNOSIS — L97819 Non-pressure chronic ulcer of other part of right lower leg with unspecified severity: Secondary | ICD-10-CM | POA: Diagnosis not present

## 2022-05-22 ENCOUNTER — Other Ambulatory Visit: Payer: Self-pay

## 2022-05-22 ENCOUNTER — Emergency Department
Admission: EM | Admit: 2022-05-22 | Discharge: 2022-05-22 | Disposition: A | Discharge status: Home / Self Care | Payer: Medicare HMO | Attending: Emergency Medicine | Admitting: Emergency Medicine

## 2022-05-22 ENCOUNTER — Encounter: Payer: Self-pay | Admitting: Emergency Medicine

## 2022-05-22 DIAGNOSIS — I110 Hypertensive heart disease with heart failure: Secondary | ICD-10-CM | POA: Diagnosis not present

## 2022-05-22 DIAGNOSIS — I2510 Atherosclerotic heart disease of native coronary artery without angina pectoris: Secondary | ICD-10-CM | POA: Diagnosis not present

## 2022-05-22 DIAGNOSIS — I10 Essential (primary) hypertension: Secondary | ICD-10-CM | POA: Diagnosis not present

## 2022-05-22 DIAGNOSIS — R42 Dizziness and giddiness: Secondary | ICD-10-CM | POA: Insufficient documentation

## 2022-05-22 DIAGNOSIS — I70212 Atherosclerosis of native arteries of extremities with intermittent claudication, left leg: Secondary | ICD-10-CM | POA: Diagnosis not present

## 2022-05-22 DIAGNOSIS — I83028 Varicose veins of left lower extremity with ulcer other part of lower leg: Secondary | ICD-10-CM | POA: Diagnosis not present

## 2022-05-22 DIAGNOSIS — I83018 Varicose veins of right lower extremity with ulcer other part of lower leg: Secondary | ICD-10-CM | POA: Diagnosis not present

## 2022-05-22 DIAGNOSIS — I70222 Atherosclerosis of native arteries of extremities with rest pain, left leg: Secondary | ICD-10-CM | POA: Diagnosis not present

## 2022-05-22 DIAGNOSIS — Z7901 Long term (current) use of anticoagulants: Secondary | ICD-10-CM | POA: Insufficient documentation

## 2022-05-22 DIAGNOSIS — L97819 Non-pressure chronic ulcer of other part of right lower leg with unspecified severity: Secondary | ICD-10-CM | POA: Diagnosis not present

## 2022-05-22 DIAGNOSIS — I4891 Unspecified atrial fibrillation: Secondary | ICD-10-CM | POA: Diagnosis not present

## 2022-05-22 DIAGNOSIS — Z79899 Other long term (current) drug therapy: Secondary | ICD-10-CM | POA: Diagnosis not present

## 2022-05-22 DIAGNOSIS — E119 Type 2 diabetes mellitus without complications: Secondary | ICD-10-CM | POA: Diagnosis not present

## 2022-05-22 DIAGNOSIS — L97822 Non-pressure chronic ulcer of other part of left lower leg with fat layer exposed: Secondary | ICD-10-CM | POA: Diagnosis not present

## 2022-05-22 LAB — TROPONIN I (HIGH SENSITIVITY)
Troponin I (High Sensitivity): 25 ng/L — ABNORMAL HIGH (ref <18)
Troponin I (High Sensitivity): 24 ng/L — ABNORMAL HIGH (ref <18)

## 2022-05-22 LAB — CBC WITH DIFFERENTIAL/PLATELET
Abs Immature Granulocytes: 0.06 10*3/uL (ref 0.00–0.07)
Basophils Absolute: 0.1 10*3/uL (ref 0.0–0.1)
Basophils Relative: 1 %
Eosinophils Absolute: 0.3 10*3/uL (ref 0.0–0.5)
Eosinophils Relative: 3 %
HCT: 27.1 % — ABNORMAL LOW (ref 39.0–52.0)
Hemoglobin: 8.8 g/dL — ABNORMAL LOW (ref 13.0–17.0)
Immature Granulocytes: 1 %
Lymphocytes Relative: 21 %
Lymphs Abs: 2 10*3/uL (ref 0.7–4.0)
MCH: 35.5 pg — ABNORMAL HIGH (ref 26.0–34.0)
MCHC: 32.5 g/dL (ref 30.0–36.0)
MCV: 109.3 fL — ABNORMAL HIGH (ref 80.0–100.0)
Monocytes Absolute: 1.5 10*3/uL — ABNORMAL HIGH (ref 0.1–1.0)
Monocytes Relative: 16 %
Neutro Abs: 5.6 10*3/uL (ref 1.7–7.7)
Neutrophils Relative %: 58 %
Platelets: 253 10*3/uL (ref 150–400)
RBC: 2.48 MIL/uL — ABNORMAL LOW (ref 4.22–5.81)
RDW: 19.8 % — ABNORMAL HIGH (ref 11.5–15.5)
WBC: 9.6 10*3/uL (ref 4.0–10.5)
nRBC: 0 % (ref 0.0–0.2)

## 2022-05-22 LAB — COMPREHENSIVE METABOLIC PANEL
ALT: 15 U/L (ref 0–44)
AST: 27 U/L (ref 15–41)
Albumin: 3.5 g/dL (ref 3.5–5.0)
Alkaline Phosphatase: 60 U/L (ref 38–126)
Anion gap: 7 (ref 5–15)
BUN: 53 mg/dL — ABNORMAL HIGH (ref 8–23)
CO2: 32 mmol/L (ref 22–32)
Calcium: 8.5 mg/dL — ABNORMAL LOW (ref 8.9–10.3)
Chloride: 92 mmol/L — ABNORMAL LOW (ref 98–111)
Creatinine, Ser: 1.47 mg/dL — ABNORMAL HIGH (ref 0.61–1.24)
GFR, Estimated: 45 mL/min — ABNORMAL LOW (ref >60)
Glucose, Bld: 160 mg/dL — ABNORMAL HIGH (ref 70–99)
Potassium: 3.8 mmol/L (ref 3.5–5.1)
Sodium: 131 mmol/L — ABNORMAL LOW (ref 135–145)
Total Bilirubin: 0.8 mg/dL (ref 0.3–1.2)
Total Protein: 7.2 g/dL (ref 6.5–8.1)

## 2022-05-22 LAB — URINALYSIS, ROUTINE W REFLEX MICROSCOPIC
Bacteria, UA: NONE SEEN
Bilirubin Urine: NEGATIVE
Glucose, UA: >=500 mg/dL — AB
Hgb urine dipstick: NEGATIVE
Ketones, ur: NEGATIVE mg/dL
Leukocytes,Ua: NEGATIVE
Nitrite: NEGATIVE
Protein, ur: NEGATIVE mg/dL
Specific Gravity, Urine: 1.007 (ref 1.005–1.030)
Squamous Epithelial / HPF: NONE SEEN (ref 0–5)
pH: 7 (ref 5.0–8.0)

## 2022-05-22 LAB — CBG MONITORING, ED: Glucose-Capillary: 157 mg/dL — ABNORMAL HIGH (ref 70–99)

## 2022-05-22 LAB — LIPASE, BLOOD: Lipase: 34 U/L (ref 11–51)

## 2022-05-22 LAB — MAGNESIUM: Magnesium: 2.7 mg/dL — ABNORMAL HIGH (ref 1.7–2.4)

## 2022-05-22 MED ORDER — SODIUM CHLORIDE 0.9 % IV BOLUS (SEPSIS)
500.0000 mL | Freq: Once | INTRAVENOUS | Status: AC
Start: 2022-05-22 — End: 2022-05-22
  Administered 2022-05-22: 500 mL via INTRAVENOUS

## 2022-05-22 MED ORDER — MECLIZINE HCL 12.5 MG PO TABS: 12.5000 mg | ORAL_TABLET | Freq: Three times a day (TID) | ORAL | 0 refills | Status: DC | PRN

## 2022-05-22 MED ORDER — MECLIZINE HCL 25 MG PO TABS
12.5000 mg | ORAL_TABLET | ORAL | Status: DC | PRN
Start: 2022-05-22 — End: 2022-05-22
  Administered 2022-05-22: 12.5 mg via ORAL
  Filled 2022-05-22: qty 1

## 2022-05-22 NOTE — ED Provider Notes (Signed)
[facility] Provider Note    Event Date/Time   First MD Initiated Contact with Patient 05/22/22 0120     (approximate)   History   Dizziness   HPI  [name] [name] is a 88 y.o. male with history of hypertension, diabetes, thyroid disease, CAD, atrial fibrillation on Eliquis who presents to the emergency department this daughter for concerns for dizziness.  States that it came on suddenly tonight and felt like he was going to vomit.  No vomiting or diarrhea.  No bloody stools or melena.  No chest pain or shortness of breath.  No numbness, tingling or focal weakness.  No headache or head injury.  Symptoms have now resolved.  States he felt like the room was moving.  No hearing loss, tinnitus or ear pain.  Feeling better at this time.  Daughter is concerned because she states that he had a similar presentation and had low blood sugar and low potassium.  History provided by patient and daughter.    Past Medical History:  Diagnosis Date   A-fib ([place])    B12 deficiency    Coronary artery disease    HTN (hypertension), benign 04/25/2014   Iron deficiency anemia    Squamous cell carcinoma of skin 11/18/2021   right ear and mandible, EDC   Squamous cell carcinoma of skin 03/18/2022   right mandible and ear/refer for Mohs/ Dr. Lacinda Axon has referred pt to Dr. Truman Hayward in otolaryngology   Thyroid disease    Type 2 diabetes mellitus with peripheral angiopathy ([place]) 11/10/2018    Past Surgical History:  Procedure Laterality Date   CARDIAC SURGERY     CABG 1991   HERNIA REPAIR     LOWER EXTREMITY ANGIOGRAPHY Left 08/30/2021   Procedure: LOWER EXTREMITY ANGIOGRAPHY;  Surgeon: Katha Cabal, MD;  Location: [place] [facility];  Service: Cardiovascular;  Laterality: Left;    MEDICATIONS:  Prior to Admission medications   Medication Sig Start Date End Date Taking? Authorizing Provider  acetaminophen (TYLENOL) 500 MG tablet Take 1,000 mg by mouth every 6 (six) hours  as needed for moderate pain.    [provider]  apixaban (ELIQUIS) 5 MG TABS tablet Take 5 mg by mouth 2 (two) times daily.    [provider]  aspirin EC 81 MG EC tablet Take 1 tablet (81 mg total) by mouth daily. Swallow whole. 09/01/21   Serafina Mitchell, MD  cetirizine (ZYRTEC) 10 MG tablet Take 10 mg by mouth daily as needed for allergies.    [provider]  Cyanocobalamin (VITAMIN B-12) 1000 MCG SUBL Take 1,000 mcg by mouth daily. 03/23/14   [provider]  empagliflozin (JARDIANCE) 10 MG TABS tablet Take 1 tablet by mouth daily. 09/11/21 09/11/22  [provider]  ferrous sulfate 325 (65 FE) MG EC tablet Take 325 mg by mouth daily.    [provider]  levothyroxine (SYNTHROID, LEVOTHROID) 112 MCG tablet Take 112 mcg by mouth daily. 03/28/14   [provider]  metoprolol succinate (TOPROL-XL) 50 MG 24 hr tablet Take 50 mg by mouth daily. 03/28/14   [provider]  Multiple Vitamins-Minerals (MULTIVITAMIN WITH MINERALS) tablet Take 1 tablet by mouth daily. 06/11/16   [provider]  Omega-3 Fatty Acids (FISH OIL) 1000 MG CAPS Take 1,000 mg by mouth in the morning and at bedtime.    [provider]  omeprazole (PRILOSEC) 20 MG capsule Take 20 mg by mouth daily. 01/06/17 05/19/22  [provider]  polyethylene glycol (MIRALAX / GLYCOLAX) packet Take 17 g by mouth daily. Mix one tablespoon with 8oz of your favorite juice or water every day until you are having soft formed stools. Then start taking once daily if you didn't have a stool the day before. Patient not taking: Reported on 05/19/2022 06/01/18   Merlyn Lot, MD  simvastatin (ZOCOR) 40 MG tablet Take 40 mg by mouth daily. 03/28/14   [provider]  torsemide (DEMADEX) 20 MG tablet Take 40 mg by mouth daily.    [provider]    Physical Exam   Triage Vital Signs: ED Triage Vitals  Enc Vitals Group     BP 05/22/22 0054 (!)  112/53     Pulse Rate 05/22/22 0054 68     Resp 05/22/22 0054 15     Temp 05/22/22 0054 97.9 F (36.6 C)     Temp Source 05/22/22 0054 Oral     SpO2 05/22/22 0054 100 %     Weight 05/22/22 0054 147 lb (66.7 kg)     Height 05/22/22 0054 '5\' 8"'$  (1.727 m)     Head Circumference --      Peak Flow --      Pain Score 05/22/22 0056 0     Pain Loc --      Pain Edu? --      Excl. in [place]? --     Most recent vital signs: Vitals:   05/22/22 0400 05/22/22 0415  BP: (!) 112/52 (!) 124/59  Pulse: 62 68  Resp: 16 (!) 22  Temp:    SpO2: 100% 100%    CONSTITUTIONAL: Alert and oriented and responds appropriately to questions. Well-appearing; well-nourished, elderly, in no distress HEAD: Normocephalic, atraumatic EYES: Conjunctivae clear, pupils appear equal, sclera nonicteric ENT: normal nose; moist mucous membranes NECK: Supple, normal ROM CARD: Irregularly irregular and rate controlled; S1 and S2 appreciated; no murmurs, no clicks, no rubs, no gallops RESP: Normal chest excursion without splinting or tachypnea; breath sounds clear and equal bilaterally; no wheezes, no rhonchi, no rales, no hypoxia or respiratory distress, speaking full sentences ABD/GI: Normal bowel sounds; non-distended; soft, non-tender, no rebound, no guarding, no peritoneal signs BACK: The back appears normal EXT: Normal ROM in all joints; no deformity noted, no edema; no cyanosis SKIN: Normal color for age and race; warm; no rash on exposed skin NEURO: Moves all extremities equally, normal speech, no drift, normal sensation diffusely, cranial nerves II through XII intact, no nystagmus PSYCH: The patient's mood and manner are appropriate.   ED Results / Procedures / Treatments   LABS: (all labs ordered are listed, but only abnormal results are displayed) Labs Reviewed  CBC WITH DIFFERENTIAL/PLATELET - Abnormal; Notable for the following components:      Result Value   RBC 2.48 (*)    Hemoglobin 8.8 (*)    HCT 27.1  (*)    MCV 109.3 (*)    MCH 35.5 (*)    RDW 19.8 (*)    Monocytes Absolute 1.5 (*)    All other components within normal limits  MAGNESIUM - Abnormal; Notable for the following components:   Magnesium 2.7 (*)    All other components within normal limits  COMPREHENSIVE METABOLIC PANEL - Abnormal; Notable for the following components:   Sodium 131 (*)    Chloride 92 (*)    Glucose, Bld 160 (*)    BUN 53 (*)    Creatinine, Ser 1.47 (*)    Calcium  8.5 (*)    GFR, Estimated 45 (*)    All other components within normal limits  URINALYSIS, ROUTINE W REFLEX MICROSCOPIC - Abnormal; Notable for the following components:   Color, Urine STRAW (*)    APPearance CLEAR (*)    Glucose, UA >=500 (*)    All other components within normal limits  CBG MONITORING, ED - Abnormal; Notable for the following components:   Glucose-Capillary 157 (*)    All other components within normal limits  TROPONIN I (HIGH SENSITIVITY) - Abnormal; Notable for the following components:   Troponin I (High Sensitivity) 25 (*)    All other components within normal limits  TROPONIN I (HIGH SENSITIVITY) - Abnormal; Notable for the following components:   Troponin I (High Sensitivity) 24 (*)    All other components within normal limits  LIPASE, BLOOD     EKG:  EKG Interpretation  Date/Time:  Thursday May 22 2022 01:36:14 EDT Ventricular Rate:  60 PR Interval:    QRS Duration: 128 QT Interval:  401 QTC Calculation: 401 R Axis:   -26 Text Interpretation: Atrial fibrillation Left bundle branch block No significant change since last tracing Confirmed by Pryor Curia [phone]) on 05/22/2022 2:24:53 AM         RADIOLOGY: My personal review and interpretation of imaging:    I have personally reviewed all radiology reports.   No results found.   PROCEDURES:  Critical Care performed: No      .1-3 Lead EKG Interpretation Performed by: Trevor Wilkie, Delice Bison, DO Authorized by: Marie Chow, Delice Bison, DO      Interpretation: normal     ECG rate:  62   ECG rate assessment: normal     Rhythm: sinus rhythm     Ectopy: none     Conduction: normal      IMPRESSION / MDM / ASSESSMENT AND PLAN / ED COURSE  I reviewed the triage vital signs and the nursing notes.    Patient with complaints of vertigo.  Daughter reports similar symptoms with hypoglycemia and hypokalemia before.  Now asymptomatic.  The patient is on the cardiac monitor to evaluate for evidence of arrhythmia and/or significant heart rate changes.   DIFFERENTIAL DIAGNOSIS (includes but not limited to):   Anemia, electrolyte derangement, UTI, dehydration, ACS, arrhythmia, peripheral vertigo, CVA   PLAN: We will obtain CBC, CMP, magnesium, troponin, urinalysis, EKG.  We will give IV fluids and meclizine as needed.  I do not feel at this time he needs an MRI of his brain as he is asymptomatic.  Neurologically intact currently with NIH stroke scale of 0.  Low suspicion for CVA currently.  Not a tPA candidate.   MEDICATIONS GIVEN IN ED: Medications  meclizine (ANTIVERT) tablet 12.5 mg (12.5 mg Oral Given 05/22/22 0138)  sodium chloride 0.9 % bolus 500 mL (0 mLs Intravenous Stopped 05/22/22 0233)  sodium chloride 0.9 % bolus 500 mL (500 mLs Intravenous New Bag/Given 05/22/22 0353)     ED COURSE: Patient's labs are reassuring.  Chronic kidney disease which is stable.  Normal electrolytes.  Chronic anemia which is stable.  Normal glucose.  Troponin x2 negative.  Urine shows no sign of infection, ketones to suggest dehydration.  He did have 1 soft blood pressure but this has improved.  He has received IV fluids here and is still asymptomatic.  Able to ambulate without difficulty.  Tolerating p.o. without difficulty.  Orthostats negative.  I suspect peripheral vertigo as the cause of his symptoms.  Will  discharge with meclizine.  Patient and daughter comfortable with this plan.  Again low suspicion for central process.  I do not feel he needs  admission for stroke work-up.   At this time, I do not feel there is any life-threatening condition present. I reviewed all nursing notes, vitals, pertinent previous records.  All lab and urine results, EKGs, imaging ordered have been independently reviewed and interpreted by myself.  I reviewed all available radiology reports from any imaging ordered this visit.  Based on my assessment, I feel the patient is safe to be discharged home without further emergent workup and can continue workup as an outpatient as needed. Discussed all findings, treatment plan as well as usual and customary return precautions with patient and daughter.  They verbalize understanding and are comfortable with this plan.  Outpatient follow-up has been provided as needed.  All questions have been answered.    CONSULTS: Admission considered but given patient's work-up is reassuring and he is feeling better, hemodynamically stable, neurologically intact, will discharge for outpatient management.   OUTSIDE RECORDS REVIEWED: Reviewed patient's last internal medicine note with Dr. Sabra Heck on 05/09/2022.         FINAL CLINICAL IMPRESSION(S) / ED DIAGNOSES   Final diagnoses:  Vertigo     Rx / DC Orders   ED Discharge Orders          Ordered    meclizine (ANTIVERT) 12.5 MG tablet  3 times daily PRN        05/22/22 0431             Note:  This document was prepared using Dragon voice recognition software and may include unintentional dictation errors.   Danyel Griess, Delice Bison, DO 05/22/22 [phone]

## 2022-05-22 NOTE — ED Notes (Signed)
Dr. Ward at the bedside.  

## 2022-05-22 NOTE — ED Triage Notes (Signed)
Pt to triage via w/c with no distress noted, accomp by daughter; Pt reports dizziness and nausea for several hrs; st hx of same with hypokalemia; pt denies any c/o pain

## 2022-05-28 ENCOUNTER — Encounter
Admission: RE | Admit: 2022-05-28 | Discharge: 2022-05-28 | Disposition: A | Discharge status: Home / Self Care | Payer: Medicare HMO | Source: Ambulatory Visit | Attending: Radiation Oncology | Admitting: Radiation Oncology

## 2022-05-28 DIAGNOSIS — K4090 Unilateral inguinal hernia, without obstruction or gangrene, not specified as recurrent: Secondary | ICD-10-CM | POA: Diagnosis not present

## 2022-05-28 DIAGNOSIS — M40204 Unspecified kyphosis, thoracic region: Secondary | ICD-10-CM | POA: Diagnosis not present

## 2022-05-28 DIAGNOSIS — C07 Malignant neoplasm of parotid gland: Secondary | ICD-10-CM | POA: Insufficient documentation

## 2022-05-28 DIAGNOSIS — I2510 Atherosclerotic heart disease of native coronary artery without angina pectoris: Secondary | ICD-10-CM | POA: Diagnosis not present

## 2022-05-28 MED ORDER — FLUDEOXYGLUCOSE F - 18 (FDG) INJECTION
7.6000 | Freq: Once | INTRAVENOUS | Status: AC | PRN
  Administered 2022-05-28: 8.31 via INTRAVENOUS

## 2022-05-29 ENCOUNTER — Ambulatory Visit: Payer: Medicare HMO

## 2022-05-29 DIAGNOSIS — Z510 Encounter for antineoplastic radiation therapy: Secondary | ICD-10-CM | POA: Insufficient documentation

## 2022-05-29 DIAGNOSIS — C770 Secondary and unspecified malignant neoplasm of lymph nodes of head, face and neck: Secondary | ICD-10-CM | POA: Insufficient documentation

## 2022-05-29 DIAGNOSIS — C4442 Squamous cell carcinoma of skin of scalp and neck: Secondary | ICD-10-CM | POA: Diagnosis not present

## 2022-05-29 DIAGNOSIS — C07 Malignant neoplasm of parotid gland: Secondary | ICD-10-CM | POA: Insufficient documentation

## 2022-05-29 LAB — GLUCOSE, CAPILLARY: Glucose-Capillary: 106 mg/dL — ABNORMAL HIGH (ref 70–99)

## 2022-06-02 DIAGNOSIS — I509 Heart failure, unspecified: Secondary | ICD-10-CM | POA: Diagnosis not present

## 2022-06-02 DIAGNOSIS — I2510 Atherosclerotic heart disease of native coronary artery without angina pectoris: Secondary | ICD-10-CM | POA: Diagnosis not present

## 2022-06-02 DIAGNOSIS — L0201 Cutaneous abscess of face: Secondary | ICD-10-CM | POA: Diagnosis not present

## 2022-06-02 DIAGNOSIS — L03211 Cellulitis of face: Secondary | ICD-10-CM | POA: Diagnosis not present

## 2022-06-03 DIAGNOSIS — S81811A Laceration without foreign body, right lower leg, initial encounter: Secondary | ICD-10-CM | POA: Diagnosis not present

## 2022-06-05 DIAGNOSIS — C4442 Squamous cell carcinoma of skin of scalp and neck: Secondary | ICD-10-CM | POA: Diagnosis not present

## 2022-06-05 DIAGNOSIS — C770 Secondary and unspecified malignant neoplasm of lymph nodes of head, face and neck: Secondary | ICD-10-CM | POA: Diagnosis not present

## 2022-06-05 DIAGNOSIS — C07 Malignant neoplasm of parotid gland: Secondary | ICD-10-CM | POA: Diagnosis not present

## 2022-06-05 DIAGNOSIS — Z510 Encounter for antineoplastic radiation therapy: Secondary | ICD-10-CM | POA: Diagnosis not present

## 2022-06-06 ENCOUNTER — Other Ambulatory Visit: Payer: Self-pay | Admitting: *Deleted

## 2022-06-06 DIAGNOSIS — L0390 Cellulitis, unspecified: Secondary | ICD-10-CM | POA: Diagnosis not present

## 2022-06-06 DIAGNOSIS — I509 Heart failure, unspecified: Secondary | ICD-10-CM | POA: Diagnosis not present

## 2022-06-06 DIAGNOSIS — C07 Malignant neoplasm of parotid gland: Secondary | ICD-10-CM

## 2022-06-09 ENCOUNTER — Ambulatory Visit
Admission: RE | Admit: 2022-06-09 | Discharge: 2022-06-09 | Disposition: A | Discharge status: Home / Self Care | Payer: Medicare HMO | Source: Ambulatory Visit | Attending: Radiation Oncology | Admitting: Radiation Oncology

## 2022-06-10 ENCOUNTER — Ambulatory Visit
Admission: RE | Admit: 2022-06-10 | Discharge: 2022-06-10 | Disposition: A | Discharge status: Home / Self Care | Payer: Medicare HMO | Source: Ambulatory Visit | Attending: Radiation Oncology | Admitting: Radiation Oncology

## 2022-06-10 ENCOUNTER — Other Ambulatory Visit: Payer: Self-pay

## 2022-06-10 DIAGNOSIS — C770 Secondary and unspecified malignant neoplasm of lymph nodes of head, face and neck: Secondary | ICD-10-CM | POA: Diagnosis not present

## 2022-06-10 DIAGNOSIS — C4442 Squamous cell carcinoma of skin of scalp and neck: Secondary | ICD-10-CM | POA: Diagnosis not present

## 2022-06-10 DIAGNOSIS — S81811A Laceration without foreign body, right lower leg, initial encounter: Secondary | ICD-10-CM | POA: Diagnosis not present

## 2022-06-10 DIAGNOSIS — C07 Malignant neoplasm of parotid gland: Secondary | ICD-10-CM | POA: Diagnosis not present

## 2022-06-10 DIAGNOSIS — Z510 Encounter for antineoplastic radiation therapy: Secondary | ICD-10-CM | POA: Diagnosis not present

## 2022-06-10 DIAGNOSIS — I5023 Acute on chronic systolic (congestive) heart failure: Secondary | ICD-10-CM | POA: Diagnosis not present

## 2022-06-10 LAB — RAD ONC ARIA SESSION SUMMARY
Course Elapsed Days: 0
Plan Fractions Treated to Date: 1
Plan Prescribed Dose Per Fraction: 2 Gy
Plan Total Fractions Prescribed: 35
Plan Total Prescribed Dose: 70 Gy
Reference Point Dosage Given to Date: 2 Gy
Reference Point Session Dosage Given: 2 Gy
Session Number: 1

## 2022-06-11 ENCOUNTER — Other Ambulatory Visit: Payer: Self-pay

## 2022-06-11 ENCOUNTER — Ambulatory Visit
Admission: RE | Admit: 2022-06-11 | Discharge: 2022-06-11 | Disposition: A | Discharge status: Home / Self Care | Payer: Medicare HMO | Source: Ambulatory Visit | Attending: Radiation Oncology | Admitting: Radiation Oncology

## 2022-06-11 DIAGNOSIS — C07 Malignant neoplasm of parotid gland: Secondary | ICD-10-CM | POA: Diagnosis not present

## 2022-06-11 DIAGNOSIS — I5023 Acute on chronic systolic (congestive) heart failure: Secondary | ICD-10-CM | POA: Diagnosis not present

## 2022-06-11 DIAGNOSIS — S81811A Laceration without foreign body, right lower leg, initial encounter: Secondary | ICD-10-CM | POA: Diagnosis not present

## 2022-06-11 DIAGNOSIS — Z510 Encounter for antineoplastic radiation therapy: Secondary | ICD-10-CM | POA: Diagnosis not present

## 2022-06-11 DIAGNOSIS — C4442 Squamous cell carcinoma of skin of scalp and neck: Secondary | ICD-10-CM | POA: Diagnosis not present

## 2022-06-11 DIAGNOSIS — C770 Secondary and unspecified malignant neoplasm of lymph nodes of head, face and neck: Secondary | ICD-10-CM | POA: Diagnosis not present

## 2022-06-11 LAB — RAD ONC ARIA SESSION SUMMARY
Course Elapsed Days: 1
Plan Fractions Treated to Date: 2
Plan Prescribed Dose Per Fraction: 2 Gy
Plan Total Fractions Prescribed: 35
Plan Total Prescribed Dose: 70 Gy
Reference Point Dosage Given to Date: 4 Gy
Reference Point Session Dosage Given: 2 Gy
Session Number: 2

## 2022-06-12 ENCOUNTER — Ambulatory Visit: Payer: Medicare HMO

## 2022-06-13 ENCOUNTER — Other Ambulatory Visit: Payer: Self-pay

## 2022-06-13 ENCOUNTER — Ambulatory Visit
Admission: RE | Admit: 2022-06-13 | Discharge: 2022-06-13 | Disposition: A | Discharge status: Home / Self Care | Payer: Medicare HMO | Source: Ambulatory Visit | Attending: Radiation Oncology | Admitting: Radiation Oncology

## 2022-06-13 DIAGNOSIS — C770 Secondary and unspecified malignant neoplasm of lymph nodes of head, face and neck: Secondary | ICD-10-CM | POA: Diagnosis not present

## 2022-06-13 DIAGNOSIS — L03115 Cellulitis of right lower limb: Secondary | ICD-10-CM | POA: Diagnosis not present

## 2022-06-13 DIAGNOSIS — C07 Malignant neoplasm of parotid gland: Secondary | ICD-10-CM | POA: Diagnosis not present

## 2022-06-13 DIAGNOSIS — Z872 Personal history of diseases of the skin and subcutaneous tissue: Secondary | ICD-10-CM | POA: Diagnosis not present

## 2022-06-13 DIAGNOSIS — Z510 Encounter for antineoplastic radiation therapy: Secondary | ICD-10-CM | POA: Diagnosis not present

## 2022-06-13 DIAGNOSIS — C4442 Squamous cell carcinoma of skin of scalp and neck: Secondary | ICD-10-CM | POA: Diagnosis not present

## 2022-06-13 LAB — RAD ONC ARIA SESSION SUMMARY
Course Elapsed Days: 3
Plan Fractions Treated to Date: 3
Plan Prescribed Dose Per Fraction: 2 Gy
Plan Total Fractions Prescribed: 35
Plan Total Prescribed Dose: 70 Gy
Reference Point Dosage Given to Date: 6 Gy
Reference Point Session Dosage Given: 2 Gy
Session Number: 3

## 2022-06-16 ENCOUNTER — Other Ambulatory Visit: Payer: Self-pay

## 2022-06-16 ENCOUNTER — Ambulatory Visit
Admission: RE | Admit: 2022-06-16 | Discharge: 2022-06-16 | Disposition: A | Discharge status: Home / Self Care | Payer: Medicare HMO | Source: Ambulatory Visit | Attending: Radiation Oncology | Admitting: Radiation Oncology

## 2022-06-16 DIAGNOSIS — C770 Secondary and unspecified malignant neoplasm of lymph nodes of head, face and neck: Secondary | ICD-10-CM | POA: Diagnosis not present

## 2022-06-16 DIAGNOSIS — C4442 Squamous cell carcinoma of skin of scalp and neck: Secondary | ICD-10-CM | POA: Diagnosis not present

## 2022-06-16 DIAGNOSIS — C07 Malignant neoplasm of parotid gland: Secondary | ICD-10-CM | POA: Diagnosis not present

## 2022-06-16 DIAGNOSIS — Z510 Encounter for antineoplastic radiation therapy: Secondary | ICD-10-CM | POA: Diagnosis not present

## 2022-06-16 LAB — RAD ONC ARIA SESSION SUMMARY
Course Elapsed Days: 6
Plan Fractions Treated to Date: 4
Plan Prescribed Dose Per Fraction: 2 Gy
Plan Total Fractions Prescribed: 35
Plan Total Prescribed Dose: 70 Gy
Reference Point Dosage Given to Date: 8 Gy
Reference Point Session Dosage Given: 2 Gy
Session Number: 4

## 2022-06-17 ENCOUNTER — Ambulatory Visit
Admission: RE | Admit: 2022-06-17 | Discharge: 2022-06-17 | Disposition: A | Discharge status: Home / Self Care | Payer: Medicare HMO | Source: Ambulatory Visit | Attending: Radiation Oncology | Admitting: Radiation Oncology

## 2022-06-17 ENCOUNTER — Other Ambulatory Visit: Payer: Self-pay | Admitting: *Deleted

## 2022-06-17 ENCOUNTER — Other Ambulatory Visit: Payer: Self-pay

## 2022-06-17 ENCOUNTER — Inpatient Hospital Stay: Payer: Medicare HMO | Attending: Radiation Oncology

## 2022-06-17 DIAGNOSIS — C770 Secondary and unspecified malignant neoplasm of lymph nodes of head, face and neck: Secondary | ICD-10-CM | POA: Diagnosis not present

## 2022-06-17 DIAGNOSIS — C07 Malignant neoplasm of parotid gland: Secondary | ICD-10-CM | POA: Diagnosis not present

## 2022-06-17 DIAGNOSIS — C4442 Squamous cell carcinoma of skin of scalp and neck: Secondary | ICD-10-CM | POA: Diagnosis not present

## 2022-06-17 DIAGNOSIS — Z510 Encounter for antineoplastic radiation therapy: Secondary | ICD-10-CM | POA: Diagnosis not present

## 2022-06-17 LAB — CBC
HCT: 32.2 % — ABNORMAL LOW (ref 39.0–52.0)
Hemoglobin: 11 g/dL — ABNORMAL LOW (ref 13.0–17.0)
MCH: 36.1 pg — ABNORMAL HIGH (ref 26.0–34.0)
MCHC: 34.2 g/dL (ref 30.0–36.0)
MCV: 105.6 fL — ABNORMAL HIGH (ref 80.0–100.0)
Platelets: 243 10*3/uL (ref 150–400)
RBC: 3.05 MIL/uL — ABNORMAL LOW (ref 4.22–5.81)
RDW: 18 % — ABNORMAL HIGH (ref 11.5–15.5)
WBC: 12.2 10*3/uL — ABNORMAL HIGH (ref 4.0–10.5)
nRBC: 0 % (ref 0.0–0.2)

## 2022-06-17 LAB — RAD ONC ARIA SESSION SUMMARY
Course Elapsed Days: 7
Plan Fractions Treated to Date: 5
Plan Prescribed Dose Per Fraction: 2 Gy
Plan Total Fractions Prescribed: 35
Plan Total Prescribed Dose: 70 Gy
Reference Point Dosage Given to Date: 10 Gy
Reference Point Session Dosage Given: 2 Gy
Session Number: 5

## 2022-06-18 ENCOUNTER — Ambulatory Visit
Admission: RE | Admit: 2022-06-18 | Discharge: 2022-06-18 | Disposition: A | Discharge status: Home / Self Care | Payer: Medicare HMO | Source: Ambulatory Visit | Attending: Radiation Oncology | Admitting: Radiation Oncology

## 2022-06-18 ENCOUNTER — Other Ambulatory Visit: Payer: Self-pay

## 2022-06-18 ENCOUNTER — Other Ambulatory Visit: Payer: Self-pay | Admitting: *Deleted

## 2022-06-18 DIAGNOSIS — C07 Malignant neoplasm of parotid gland: Secondary | ICD-10-CM | POA: Diagnosis not present

## 2022-06-18 DIAGNOSIS — Z510 Encounter for antineoplastic radiation therapy: Secondary | ICD-10-CM | POA: Diagnosis not present

## 2022-06-18 DIAGNOSIS — I509 Heart failure, unspecified: Secondary | ICD-10-CM | POA: Diagnosis not present

## 2022-06-18 DIAGNOSIS — C770 Secondary and unspecified malignant neoplasm of lymph nodes of head, face and neck: Secondary | ICD-10-CM | POA: Diagnosis not present

## 2022-06-18 DIAGNOSIS — K5909 Other constipation: Secondary | ICD-10-CM | POA: Diagnosis not present

## 2022-06-18 DIAGNOSIS — C4442 Squamous cell carcinoma of skin of scalp and neck: Secondary | ICD-10-CM | POA: Diagnosis not present

## 2022-06-18 LAB — RAD ONC ARIA SESSION SUMMARY
Course Elapsed Days: 8
Plan Fractions Treated to Date: 6
Plan Prescribed Dose Per Fraction: 2 Gy
Plan Total Fractions Prescribed: 35
Plan Total Prescribed Dose: 70 Gy
Reference Point Dosage Given to Date: 12 Gy
Reference Point Session Dosage Given: 2 Gy
Session Number: 6

## 2022-06-19 ENCOUNTER — Ambulatory Visit
Admission: RE | Admit: 2022-06-19 | Discharge: 2022-06-19 | Disposition: A | Discharge status: Home / Self Care | Payer: Medicare HMO | Source: Ambulatory Visit | Attending: Radiation Oncology | Admitting: Radiation Oncology

## 2022-06-19 ENCOUNTER — Other Ambulatory Visit: Payer: Self-pay

## 2022-06-19 DIAGNOSIS — C4442 Squamous cell carcinoma of skin of scalp and neck: Secondary | ICD-10-CM | POA: Diagnosis not present

## 2022-06-19 DIAGNOSIS — Z510 Encounter for antineoplastic radiation therapy: Secondary | ICD-10-CM | POA: Diagnosis not present

## 2022-06-19 DIAGNOSIS — C770 Secondary and unspecified malignant neoplasm of lymph nodes of head, face and neck: Secondary | ICD-10-CM | POA: Diagnosis not present

## 2022-06-19 DIAGNOSIS — C07 Malignant neoplasm of parotid gland: Secondary | ICD-10-CM | POA: Diagnosis not present

## 2022-06-19 LAB — RAD ONC ARIA SESSION SUMMARY
Course Elapsed Days: 9
Plan Fractions Treated to Date: 7
Plan Prescribed Dose Per Fraction: 2 Gy
Plan Total Fractions Prescribed: 35
Plan Total Prescribed Dose: 70 Gy
Reference Point Dosage Given to Date: 14 Gy
Reference Point Session Dosage Given: 2 Gy
Session Number: 7

## 2022-06-20 ENCOUNTER — Ambulatory Visit
Admission: RE | Admit: 2022-06-20 | Discharge: 2022-06-20 | Disposition: A | Discharge status: Home / Self Care | Payer: Medicare HMO | Source: Ambulatory Visit | Attending: Radiation Oncology | Admitting: Radiation Oncology

## 2022-06-20 ENCOUNTER — Other Ambulatory Visit: Payer: Self-pay

## 2022-06-20 DIAGNOSIS — C770 Secondary and unspecified malignant neoplasm of lymph nodes of head, face and neck: Secondary | ICD-10-CM | POA: Diagnosis not present

## 2022-06-20 DIAGNOSIS — C4442 Squamous cell carcinoma of skin of scalp and neck: Secondary | ICD-10-CM | POA: Diagnosis not present

## 2022-06-20 DIAGNOSIS — Z510 Encounter for antineoplastic radiation therapy: Secondary | ICD-10-CM | POA: Diagnosis not present

## 2022-06-20 DIAGNOSIS — C07 Malignant neoplasm of parotid gland: Secondary | ICD-10-CM | POA: Diagnosis not present

## 2022-06-20 DIAGNOSIS — S01311A Laceration without foreign body of right ear, initial encounter: Secondary | ICD-10-CM | POA: Diagnosis not present

## 2022-06-20 LAB — RAD ONC ARIA SESSION SUMMARY
Course Elapsed Days: 10
Plan Fractions Treated to Date: 8
Plan Prescribed Dose Per Fraction: 2 Gy
Plan Total Fractions Prescribed: 35
Plan Total Prescribed Dose: 70 Gy
Reference Point Dosage Given to Date: 16 Gy
Reference Point Session Dosage Given: 2 Gy
Session Number: 8

## 2022-06-22 ENCOUNTER — Other Ambulatory Visit: Payer: Self-pay

## 2022-06-22 ENCOUNTER — Emergency Department: Payer: Medicare HMO

## 2022-06-22 ENCOUNTER — Emergency Department
Admission: EM | Admit: 2022-06-22 | Discharge: 2022-06-23 | Disposition: A | Discharge status: Home / Self Care | Payer: Medicare HMO | Attending: Emergency Medicine | Admitting: Emergency Medicine

## 2022-06-22 DIAGNOSIS — K922 Gastrointestinal hemorrhage, unspecified: Secondary | ICD-10-CM | POA: Diagnosis not present

## 2022-06-22 DIAGNOSIS — I10 Essential (primary) hypertension: Secondary | ICD-10-CM | POA: Insufficient documentation

## 2022-06-22 DIAGNOSIS — I2510 Atherosclerotic heart disease of native coronary artery without angina pectoris: Secondary | ICD-10-CM | POA: Diagnosis not present

## 2022-06-22 DIAGNOSIS — R195 Other fecal abnormalities: Secondary | ICD-10-CM | POA: Diagnosis not present

## 2022-06-22 DIAGNOSIS — E119 Type 2 diabetes mellitus without complications: Secondary | ICD-10-CM | POA: Insufficient documentation

## 2022-06-22 DIAGNOSIS — K4090 Unilateral inguinal hernia, without obstruction or gangrene, not specified as recurrent: Secondary | ICD-10-CM | POA: Diagnosis not present

## 2022-06-22 DIAGNOSIS — D649 Anemia, unspecified: Secondary | ICD-10-CM | POA: Insufficient documentation

## 2022-06-22 DIAGNOSIS — D72829 Elevated white blood cell count, unspecified: Secondary | ICD-10-CM | POA: Insufficient documentation

## 2022-06-22 DIAGNOSIS — R1032 Left lower quadrant pain: Secondary | ICD-10-CM | POA: Diagnosis not present

## 2022-06-22 DIAGNOSIS — Z7901 Long term (current) use of anticoagulants: Secondary | ICD-10-CM | POA: Diagnosis not present

## 2022-06-22 DIAGNOSIS — K8020 Calculus of gallbladder without cholecystitis without obstruction: Secondary | ICD-10-CM | POA: Diagnosis not present

## 2022-06-22 DIAGNOSIS — K5900 Constipation, unspecified: Secondary | ICD-10-CM | POA: Diagnosis not present

## 2022-06-22 DIAGNOSIS — E876 Hypokalemia: Secondary | ICD-10-CM | POA: Insufficient documentation

## 2022-06-22 DIAGNOSIS — R944 Abnormal results of kidney function studies: Secondary | ICD-10-CM | POA: Insufficient documentation

## 2022-06-22 LAB — CBC WITH DIFFERENTIAL/PLATELET
Abs Immature Granulocytes: 0.06 10*3/uL (ref 0.00–0.07)
Basophils Absolute: 0.1 10*3/uL (ref 0.0–0.1)
Basophils Relative: 1 %
Eosinophils Absolute: 0.3 10*3/uL (ref 0.0–0.5)
Eosinophils Relative: 3 %
HCT: 33.2 % — ABNORMAL LOW (ref 39.0–52.0)
Hemoglobin: 11.2 g/dL — ABNORMAL LOW (ref 13.0–17.0)
Immature Granulocytes: 1 %
Lymphocytes Relative: 21 %
Lymphs Abs: 2.2 10*3/uL (ref 0.7–4.0)
MCH: 35.8 pg — ABNORMAL HIGH (ref 26.0–34.0)
MCHC: 33.7 g/dL (ref 30.0–36.0)
MCV: 106.1 fL — ABNORMAL HIGH (ref 80.0–100.0)
Monocytes Absolute: 1.6 10*3/uL — ABNORMAL HIGH (ref 0.1–1.0)
Monocytes Relative: 15 %
Neutro Abs: 6.3 10*3/uL (ref 1.7–7.7)
Neutrophils Relative %: 59 %
Platelets: 188 10*3/uL (ref 150–400)
RBC: 3.13 MIL/uL — ABNORMAL LOW (ref 4.22–5.81)
RDW: 17.8 % — ABNORMAL HIGH (ref 11.5–15.5)
WBC: 10.6 10*3/uL — ABNORMAL HIGH (ref 4.0–10.5)
nRBC: 0 % (ref 0.0–0.2)

## 2022-06-22 LAB — COMPREHENSIVE METABOLIC PANEL
ALT: 18 U/L (ref 0–44)
AST: 25 U/L (ref 15–41)
Albumin: 4.3 g/dL (ref 3.5–5.0)
Alkaline Phosphatase: 69 U/L (ref 38–126)
Anion gap: 11 (ref 5–15)
BUN: 59 mg/dL — ABNORMAL HIGH (ref 8–23)
CO2: 31 mmol/L (ref 22–32)
Calcium: 9.5 mg/dL (ref 8.9–10.3)
Chloride: 96 mmol/L — ABNORMAL LOW (ref 98–111)
Creatinine, Ser: 1.26 mg/dL — ABNORMAL HIGH (ref 0.61–1.24)
GFR, Estimated: 54 mL/min — ABNORMAL LOW (ref >60)
Glucose, Bld: 81 mg/dL (ref 70–99)
Potassium: 3.1 mmol/L — ABNORMAL LOW (ref 3.5–5.1)
Sodium: 138 mmol/L (ref 135–145)
Total Bilirubin: 0.5 mg/dL (ref 0.3–1.2)
Total Protein: 8.2 g/dL — ABNORMAL HIGH (ref 6.5–8.1)

## 2022-06-22 LAB — MAGNESIUM: Magnesium: 2.7 mg/dL — ABNORMAL HIGH (ref 1.7–2.4)

## 2022-06-22 MED ORDER — POTASSIUM CHLORIDE CRYS ER 20 MEQ PO TBCR
40.0000 meq | EXTENDED_RELEASE_TABLET | Freq: Once | ORAL | Status: AC
  Administered 2022-06-22: 40 meq via ORAL
  Filled 2022-06-22: qty 2

## 2022-06-22 MED ORDER — IOHEXOL 350 MG/ML SOLN
75.0000 mL | Freq: Once | INTRAVENOUS | Status: AC | PRN
  Administered 2022-06-22: 75 mL via INTRAVENOUS

## 2022-06-22 MED ORDER — POLYETHYLENE GLYCOL 3350 17 G PO PACK: 17.0000 g | PACK | Freq: Every day | ORAL | Status: DC

## 2022-06-22 NOTE — ED Triage Notes (Signed)
Pt states that he has been having problems with constipation- pt states he has been seen by his doctor and given meds for it- pt states "it's too big to come out"- pt states he has been seen here before for same and that we "have helped them move before"

## 2022-06-23 ENCOUNTER — Other Ambulatory Visit: Payer: Self-pay

## 2022-06-23 ENCOUNTER — Ambulatory Visit
Admission: RE | Admit: 2022-06-23 | Discharge: 2022-06-23 | Disposition: A | Discharge status: Home / Self Care | Payer: Medicare HMO | Source: Ambulatory Visit | Attending: Radiation Oncology | Admitting: Radiation Oncology

## 2022-06-23 DIAGNOSIS — Z510 Encounter for antineoplastic radiation therapy: Secondary | ICD-10-CM | POA: Diagnosis not present

## 2022-06-23 DIAGNOSIS — C4442 Squamous cell carcinoma of skin of scalp and neck: Secondary | ICD-10-CM | POA: Diagnosis not present

## 2022-06-23 DIAGNOSIS — C07 Malignant neoplasm of parotid gland: Secondary | ICD-10-CM | POA: Diagnosis not present

## 2022-06-23 DIAGNOSIS — C770 Secondary and unspecified malignant neoplasm of lymph nodes of head, face and neck: Secondary | ICD-10-CM | POA: Diagnosis not present

## 2022-06-23 LAB — URINALYSIS, COMPLETE (UACMP) WITH MICROSCOPIC
Bacteria, UA: NONE SEEN
Bilirubin Urine: NEGATIVE
Glucose, UA: NEGATIVE mg/dL
Ketones, ur: NEGATIVE mg/dL
Leukocytes,Ua: NEGATIVE
Nitrite: NEGATIVE
Protein, ur: NEGATIVE mg/dL
Specific Gravity, Urine: 1.012 (ref 1.005–1.030)
Squamous Epithelial / HPF: NONE SEEN (ref 0–5)
pH: 5 (ref 5.0–8.0)

## 2022-06-23 LAB — RAD ONC ARIA SESSION SUMMARY
Course Elapsed Days: 13
Plan Fractions Treated to Date: 9
Plan Prescribed Dose Per Fraction: 2 Gy
Plan Total Fractions Prescribed: 35
Plan Total Prescribed Dose: 70 Gy
Reference Point Dosage Given to Date: 18 Gy
Reference Point Session Dosage Given: 2 Gy
Session Number: 9

## 2022-06-23 MED ORDER — POLYETHYLENE GLYCOL 3350 17 G PO PACK
17.0000 g | PACK | ORAL | Status: AC
  Administered 2022-06-23: 17 g via ORAL
  Filled 2022-06-23: qty 1

## 2022-06-23 NOTE — ED Provider Notes (Signed)
-----------------------------------------   1:07 AM on 06/23/2022 -----------------------------------------   UA unremarkable.  Will discharge per previous provider plans on MiraLAX.  Strict return precautions given.  Patient and spouse verbalized understanding agree with plan of care.   Irean Hong, MD 06/23/22 [phone]

## 2022-06-24 ENCOUNTER — Other Ambulatory Visit: Payer: Self-pay

## 2022-06-24 ENCOUNTER — Ambulatory Visit
Admission: RE | Admit: 2022-06-24 | Discharge: 2022-06-24 | Disposition: A | Discharge status: Home / Self Care | Payer: Medicare HMO | Source: Ambulatory Visit | Attending: Radiation Oncology | Admitting: Radiation Oncology

## 2022-06-24 ENCOUNTER — Inpatient Hospital Stay: Payer: Medicare HMO

## 2022-06-24 DIAGNOSIS — C4442 Squamous cell carcinoma of skin of scalp and neck: Secondary | ICD-10-CM | POA: Diagnosis not present

## 2022-06-24 DIAGNOSIS — Z510 Encounter for antineoplastic radiation therapy: Secondary | ICD-10-CM | POA: Diagnosis not present

## 2022-06-24 DIAGNOSIS — C770 Secondary and unspecified malignant neoplasm of lymph nodes of head, face and neck: Secondary | ICD-10-CM | POA: Diagnosis not present

## 2022-06-24 DIAGNOSIS — C07 Malignant neoplasm of parotid gland: Secondary | ICD-10-CM | POA: Diagnosis not present

## 2022-06-24 LAB — RAD ONC ARIA SESSION SUMMARY
Course Elapsed Days: 14
Plan Fractions Treated to Date: 10
Plan Prescribed Dose Per Fraction: 2 Gy
Plan Total Fractions Prescribed: 35
Plan Total Prescribed Dose: 70 Gy
Reference Point Dosage Given to Date: 20 Gy
Reference Point Session Dosage Given: 2 Gy
Session Number: 10

## 2022-06-25 ENCOUNTER — Other Ambulatory Visit: Payer: Self-pay

## 2022-06-25 ENCOUNTER — Ambulatory Visit
Admission: RE | Admit: 2022-06-25 | Discharge: 2022-06-25 | Disposition: A | Discharge status: Home / Self Care | Payer: Medicare HMO | Source: Ambulatory Visit | Attending: Radiation Oncology | Admitting: Radiation Oncology

## 2022-06-25 DIAGNOSIS — C4442 Squamous cell carcinoma of skin of scalp and neck: Secondary | ICD-10-CM | POA: Diagnosis not present

## 2022-06-25 DIAGNOSIS — Z510 Encounter for antineoplastic radiation therapy: Secondary | ICD-10-CM | POA: Diagnosis not present

## 2022-06-25 DIAGNOSIS — C770 Secondary and unspecified malignant neoplasm of lymph nodes of head, face and neck: Secondary | ICD-10-CM | POA: Diagnosis not present

## 2022-06-25 DIAGNOSIS — C07 Malignant neoplasm of parotid gland: Secondary | ICD-10-CM | POA: Diagnosis not present

## 2022-06-25 LAB — RAD ONC ARIA SESSION SUMMARY
Course Elapsed Days: 15
Plan Fractions Treated to Date: 11
Plan Prescribed Dose Per Fraction: 2 Gy
Plan Total Fractions Prescribed: 35
Plan Total Prescribed Dose: 70 Gy
Reference Point Dosage Given to Date: 22 Gy
Reference Point Session Dosage Given: 2 Gy
Session Number: 11

## 2022-06-26 ENCOUNTER — Ambulatory Visit
Admission: RE | Admit: 2022-06-26 | Discharge: 2022-06-26 | Disposition: A | Discharge status: Home / Self Care | Payer: Medicare HMO | Source: Ambulatory Visit | Attending: Radiation Oncology | Admitting: Radiation Oncology

## 2022-06-26 ENCOUNTER — Other Ambulatory Visit: Payer: Self-pay

## 2022-06-26 DIAGNOSIS — C4442 Squamous cell carcinoma of skin of scalp and neck: Secondary | ICD-10-CM | POA: Diagnosis not present

## 2022-06-26 DIAGNOSIS — C07 Malignant neoplasm of parotid gland: Secondary | ICD-10-CM | POA: Diagnosis not present

## 2022-06-26 DIAGNOSIS — C770 Secondary and unspecified malignant neoplasm of lymph nodes of head, face and neck: Secondary | ICD-10-CM | POA: Diagnosis not present

## 2022-06-26 DIAGNOSIS — Z510 Encounter for antineoplastic radiation therapy: Secondary | ICD-10-CM | POA: Diagnosis not present

## 2022-06-26 LAB — RAD ONC ARIA SESSION SUMMARY
Course Elapsed Days: 16
Plan Fractions Treated to Date: 12
Plan Prescribed Dose Per Fraction: 2 Gy
Plan Total Fractions Prescribed: 35
Plan Total Prescribed Dose: 70 Gy
Reference Point Dosage Given to Date: 24 Gy
Reference Point Session Dosage Given: 2 Gy
Session Number: 12

## 2022-06-27 ENCOUNTER — Ambulatory Visit: Admission: RE | Admit: 2022-06-27 | Payer: Medicare HMO | Source: Ambulatory Visit

## 2022-06-27 ENCOUNTER — Ambulatory Visit: Payer: Medicare HMO

## 2022-06-29 ENCOUNTER — Other Ambulatory Visit: Payer: Self-pay

## 2022-06-29 ENCOUNTER — Emergency Department
Admission: EM | Admit: 2022-06-29 | Discharge: 2022-06-29 | Disposition: A | Discharge status: Home / Self Care | Payer: Medicare HMO | Attending: Emergency Medicine | Admitting: Emergency Medicine

## 2022-06-29 ENCOUNTER — Encounter: Payer: Self-pay | Admitting: Emergency Medicine

## 2022-06-29 DIAGNOSIS — R197 Diarrhea, unspecified: Secondary | ICD-10-CM | POA: Insufficient documentation

## 2022-06-29 LAB — COMPREHENSIVE METABOLIC PANEL
ALT: 18 U/L (ref 0–44)
AST: 31 U/L (ref 15–41)
Albumin: 4 g/dL (ref 3.5–5.0)
Alkaline Phosphatase: 57 U/L (ref 38–126)
Anion gap: 11 (ref 5–15)
BUN: 42 mg/dL — ABNORMAL HIGH (ref 8–23)
CO2: 30 mmol/L (ref 22–32)
Calcium: 9 mg/dL (ref 8.9–10.3)
Chloride: 98 mmol/L (ref 98–111)
Creatinine, Ser: 1.29 mg/dL — ABNORMAL HIGH (ref 0.61–1.24)
GFR, Estimated: 53 mL/min — ABNORMAL LOW (ref >60)
Glucose, Bld: 95 mg/dL (ref 70–99)
Potassium: 3.6 mmol/L (ref 3.5–5.1)
Sodium: 139 mmol/L (ref 135–145)
Total Bilirubin: 0.9 mg/dL (ref 0.3–1.2)
Total Protein: 7.3 g/dL (ref 6.5–8.1)

## 2022-06-29 LAB — LIPASE, BLOOD: Lipase: 28 U/L (ref 11–51)

## 2022-06-29 LAB — CBC
HCT: 29.3 % — ABNORMAL LOW (ref 39.0–52.0)
Hemoglobin: 9.6 g/dL — ABNORMAL LOW (ref 13.0–17.0)
MCH: 35 pg — ABNORMAL HIGH (ref 26.0–34.0)
MCHC: 32.8 g/dL (ref 30.0–36.0)
MCV: 106.9 fL — ABNORMAL HIGH (ref 80.0–100.0)
Platelets: 146 10*3/uL — ABNORMAL LOW (ref 150–400)
RBC: 2.74 MIL/uL — ABNORMAL LOW (ref 4.22–5.81)
RDW: 18 % — ABNORMAL HIGH (ref 11.5–15.5)
WBC: 8.8 10*3/uL (ref 4.0–10.5)
nRBC: 0 % (ref 0.0–0.2)

## 2022-06-29 NOTE — ED Notes (Signed)
Pt in bed, pt states that he is ready to go home, pt states that he drove here and can drive himself home. Pt states that he needs to have a bm, pt up to toilet in room and ambulatory without assistance, pt uses a can at times.  Pt verbalized understanding d/c and follow up.

## 2022-06-29 NOTE — ED Triage Notes (Signed)
Pt via POV from home. Pt c/o diarrhea for the past 3 weeks, pt was seen initially was seen here for constipation. Pt was instructed to take Miralax 3-4 doses a day, states that he stopped taking that medication 2 weeks ago but states he still has diarrhea and it continues to seep out without him knowing. Denies any abd pain or any NV. Pt is A&Ox4 and NAD

## 2022-06-29 NOTE — ED Provider Notes (Signed)
[facility] Provider Note    Event Date/Time   First MD Initiated Contact with Patient 06/29/22 1603     (approximate)   History   Diarrhea   HPI  [name] [name] is a 88 y.o. male  who presents to the emergency department today because of concern for diarrhea. The patient states that he sometimes have stool seep onto his underwear.  The patient denies any pain with this.  States that he had been seen in the emergency department for constipation a couple weeks ago and was started on MiraLAX although he states he is no longer taking this.  Per primary care documentation dated 06/24/22 he was told to stop MiraLAX and start over-the-counter Dulcolax.  Patient denies any fevers. Denies any weakness or numbness in legs. Denies any bladder retention.   Physical Exam   Triage Vital Signs: ED Triage Vitals  Enc Vitals Group     BP 06/29/22 1420 (!) 109/44     Pulse Rate 06/29/22 1420 68     Resp 06/29/22 1420 18     Temp 06/29/22 1420 97.9 F (36.6 C)     Temp Source 06/29/22 1420 Oral     SpO2 06/29/22 1420 98 %     Weight 06/29/22 1420 145 lb (65.8 kg)     Height 06/29/22 1420 '5\' 8"'$  (1.727 m)     Head Circumference --      Peak Flow --      Pain Score 06/29/22 1428 0   Most recent vital signs: Vitals:   06/29/22 1420 06/29/22 1544  BP: (!) 109/44 116/62  Pulse: 68 64  Resp: 18 17  Temp: 97.9 F (36.6 C) (!) 97.5 F (36.4 C)  SpO2: 98% 100%   General: Awake, alert, oriented. CV:  Good peripheral perfusion.  Resp:  Normal effort.  Abd:  No distention. Non tender. Rectal:  No fecal impaction   ED Results / Procedures / Treatments   Labs (all labs ordered are listed, but only abnormal results are displayed) Labs Reviewed  COMPREHENSIVE METABOLIC PANEL - Abnormal; Notable for the following components:      Result Value   BUN 42 (*)    Creatinine, Ser 1.29 (*)    GFR, Estimated 53 (*)    All other components within normal limits  CBC -  Abnormal; Notable for the following components:   RBC 2.74 (*)    Hemoglobin 9.6 (*)    HCT 29.3 (*)    MCV 106.9 (*)    MCH 35.0 (*)    RDW 18.0 (*)    Platelets 146 (*)    All other components within normal limits  LIPASE, BLOOD     EKG  None   RADIOLOGY None   PROCEDURES:  Critical Care performed: No  Procedures   MEDICATIONS ORDERED IN ED: Medications - No data to display   IMPRESSION / MDM / [place] / ED COURSE  I reviewed the triage vital signs and the nursing notes.                              Differential diagnosis includes, but is not limited to, fecal impaction, spinal cord compression.  Patient's presentation is most consistent with acute presentation with potential threat to life or bodily function.  Patient presented to the emergency department today because of concerns for diarrhea.  On rectal exam patient without any fecal impaction.  I did review CT scan that was performed at previous ER visit.  This did not show any acute concerning findings.  At this time given patient lacks any pain and no leukocytosis blood work I do not feel repeat emergent abdominal imaging is necessary.  Additionally I doubt spinal cord compression given lack of urinary retention lower extremity weakness or low back pain.  This point I do think patient would benefit from GI follow-up.  I discussed this with the patient.  We will also give patient information on food choices that might help relieve diarrhea.  FINAL CLINICAL IMPRESSION(S) / ED DIAGNOSES   Final diagnoses:  Diarrhea, unspecified type     Note:  This document was prepared using Dragon voice recognition software and may include unintentional dictation errors.    Nance Pear, MD 06/29/22 [phone]

## 2022-06-29 NOTE — Discharge Instructions (Signed)
Please seek medical attention for any high fevers, chest pain, shortness of breath, change in behavior, persistent vomiting, bloody stool or any other new or concerning symptoms.  

## 2022-06-29 NOTE — ED Notes (Signed)
Pt in bed, pt denies pain, abd is soft and non tender, pt states that he is here because he has some anal leakage, states that sometimes he has been having some flatulence and accidentally has some stool at the same time.

## 2022-06-30 ENCOUNTER — Ambulatory Visit: Payer: Medicare HMO

## 2022-07-02 ENCOUNTER — Other Ambulatory Visit: Payer: Self-pay

## 2022-07-02 ENCOUNTER — Ambulatory Visit
Admission: RE | Admit: 2022-07-02 | Discharge: 2022-07-02 | Disposition: A | Discharge status: Home / Self Care | Payer: Medicare HMO | Source: Ambulatory Visit | Attending: Radiation Oncology | Admitting: Radiation Oncology

## 2022-07-02 DIAGNOSIS — I2510 Atherosclerotic heart disease of native coronary artery without angina pectoris: Secondary | ICD-10-CM | POA: Diagnosis not present

## 2022-07-02 DIAGNOSIS — C07 Malignant neoplasm of parotid gland: Secondary | ICD-10-CM | POA: Diagnosis not present

## 2022-07-02 DIAGNOSIS — Z8500 Personal history of malignant neoplasm of unspecified digestive organ: Secondary | ICD-10-CM | POA: Diagnosis not present

## 2022-07-02 DIAGNOSIS — Z510 Encounter for antineoplastic radiation therapy: Secondary | ICD-10-CM | POA: Insufficient documentation

## 2022-07-02 DIAGNOSIS — C4442 Squamous cell carcinoma of skin of scalp and neck: Secondary | ICD-10-CM | POA: Diagnosis not present

## 2022-07-02 DIAGNOSIS — C770 Secondary and unspecified malignant neoplasm of lymph nodes of head, face and neck: Secondary | ICD-10-CM | POA: Diagnosis not present

## 2022-07-02 DIAGNOSIS — I509 Heart failure, unspecified: Secondary | ICD-10-CM | POA: Diagnosis not present

## 2022-07-02 LAB — RAD ONC ARIA SESSION SUMMARY
Course Elapsed Days: 22
Plan Fractions Treated to Date: 13
Plan Prescribed Dose Per Fraction: 2 Gy
Plan Total Fractions Prescribed: 35
Plan Total Prescribed Dose: 70 Gy
Reference Point Dosage Given to Date: 26 Gy
Reference Point Session Dosage Given: 2 Gy
Session Number: 13

## 2022-07-03 ENCOUNTER — Other Ambulatory Visit: Payer: Self-pay

## 2022-07-03 ENCOUNTER — Ambulatory Visit
Admission: RE | Admit: 2022-07-03 | Discharge: 2022-07-03 | Disposition: A | Discharge status: Home / Self Care | Payer: Medicare HMO | Source: Ambulatory Visit | Attending: Radiation Oncology | Admitting: Radiation Oncology

## 2022-07-03 DIAGNOSIS — C4442 Squamous cell carcinoma of skin of scalp and neck: Secondary | ICD-10-CM | POA: Diagnosis not present

## 2022-07-03 DIAGNOSIS — C07 Malignant neoplasm of parotid gland: Secondary | ICD-10-CM | POA: Diagnosis not present

## 2022-07-03 DIAGNOSIS — Z510 Encounter for antineoplastic radiation therapy: Secondary | ICD-10-CM | POA: Diagnosis not present

## 2022-07-03 DIAGNOSIS — C770 Secondary and unspecified malignant neoplasm of lymph nodes of head, face and neck: Secondary | ICD-10-CM | POA: Diagnosis not present

## 2022-07-03 LAB — RAD ONC ARIA SESSION SUMMARY
Course Elapsed Days: 23
Plan Fractions Treated to Date: 14
Plan Prescribed Dose Per Fraction: 2 Gy
Plan Total Fractions Prescribed: 35
Plan Total Prescribed Dose: 70 Gy
Reference Point Dosage Given to Date: 28 Gy
Reference Point Session Dosage Given: 2 Gy
Session Number: 14

## 2022-07-04 ENCOUNTER — Other Ambulatory Visit: Payer: Self-pay

## 2022-07-04 ENCOUNTER — Ambulatory Visit
Admission: RE | Admit: 2022-07-04 | Discharge: 2022-07-04 | Disposition: A | Discharge status: Home / Self Care | Payer: Medicare HMO | Source: Ambulatory Visit | Attending: Radiation Oncology | Admitting: Radiation Oncology

## 2022-07-04 DIAGNOSIS — Z510 Encounter for antineoplastic radiation therapy: Secondary | ICD-10-CM | POA: Diagnosis not present

## 2022-07-04 DIAGNOSIS — C4442 Squamous cell carcinoma of skin of scalp and neck: Secondary | ICD-10-CM | POA: Diagnosis not present

## 2022-07-04 DIAGNOSIS — C770 Secondary and unspecified malignant neoplasm of lymph nodes of head, face and neck: Secondary | ICD-10-CM | POA: Diagnosis not present

## 2022-07-04 DIAGNOSIS — C07 Malignant neoplasm of parotid gland: Secondary | ICD-10-CM | POA: Diagnosis not present

## 2022-07-04 LAB — RAD ONC ARIA SESSION SUMMARY
Course Elapsed Days: 24
Plan Fractions Treated to Date: 15
Plan Prescribed Dose Per Fraction: 2 Gy
Plan Total Fractions Prescribed: 35
Plan Total Prescribed Dose: 70 Gy
Reference Point Dosage Given to Date: 30 Gy
Reference Point Session Dosage Given: 2 Gy
Session Number: 15

## 2022-07-07 ENCOUNTER — Other Ambulatory Visit: Payer: Self-pay

## 2022-07-07 ENCOUNTER — Ambulatory Visit: Payer: Medicare HMO | Admitting: Dermatology

## 2022-07-07 ENCOUNTER — Ambulatory Visit
Admission: RE | Admit: 2022-07-07 | Discharge: 2022-07-07 | Disposition: A | Discharge status: Home / Self Care | Payer: Medicare HMO | Source: Ambulatory Visit | Attending: Radiation Oncology | Admitting: Radiation Oncology

## 2022-07-07 DIAGNOSIS — C4442 Squamous cell carcinoma of skin of scalp and neck: Secondary | ICD-10-CM | POA: Diagnosis not present

## 2022-07-07 DIAGNOSIS — C07 Malignant neoplasm of parotid gland: Secondary | ICD-10-CM | POA: Diagnosis not present

## 2022-07-07 DIAGNOSIS — Z510 Encounter for antineoplastic radiation therapy: Secondary | ICD-10-CM | POA: Diagnosis not present

## 2022-07-07 DIAGNOSIS — C770 Secondary and unspecified malignant neoplasm of lymph nodes of head, face and neck: Secondary | ICD-10-CM | POA: Diagnosis not present

## 2022-07-07 LAB — RAD ONC ARIA SESSION SUMMARY
Course Elapsed Days: 27
Plan Fractions Treated to Date: 16
Plan Prescribed Dose Per Fraction: 2 Gy
Plan Total Fractions Prescribed: 35
Plan Total Prescribed Dose: 70 Gy
Reference Point Dosage Given to Date: 32 Gy
Reference Point Session Dosage Given: 2 Gy
Session Number: 16

## 2022-07-08 ENCOUNTER — Other Ambulatory Visit: Payer: Self-pay

## 2022-07-08 ENCOUNTER — Ambulatory Visit
Admission: RE | Admit: 2022-07-08 | Discharge: 2022-07-08 | Disposition: A | Discharge status: Home / Self Care | Payer: Medicare HMO | Source: Ambulatory Visit | Attending: Radiation Oncology | Admitting: Radiation Oncology

## 2022-07-08 DIAGNOSIS — Z510 Encounter for antineoplastic radiation therapy: Secondary | ICD-10-CM | POA: Diagnosis not present

## 2022-07-08 DIAGNOSIS — C770 Secondary and unspecified malignant neoplasm of lymph nodes of head, face and neck: Secondary | ICD-10-CM | POA: Diagnosis not present

## 2022-07-08 DIAGNOSIS — C4442 Squamous cell carcinoma of skin of scalp and neck: Secondary | ICD-10-CM | POA: Diagnosis not present

## 2022-07-08 DIAGNOSIS — C07 Malignant neoplasm of parotid gland: Secondary | ICD-10-CM | POA: Diagnosis not present

## 2022-07-08 LAB — RAD ONC ARIA SESSION SUMMARY
Course Elapsed Days: 28
Plan Fractions Treated to Date: 17
Plan Prescribed Dose Per Fraction: 2 Gy
Plan Total Fractions Prescribed: 35
Plan Total Prescribed Dose: 70 Gy
Reference Point Dosage Given to Date: 34 Gy
Reference Point Session Dosage Given: 2 Gy
Session Number: 17

## 2022-07-09 ENCOUNTER — Ambulatory Visit
Admission: RE | Admit: 2022-07-09 | Discharge: 2022-07-09 | Disposition: A | Discharge status: Home / Self Care | Payer: Medicare HMO | Source: Ambulatory Visit | Attending: Radiation Oncology | Admitting: Radiation Oncology

## 2022-07-09 ENCOUNTER — Other Ambulatory Visit: Payer: Self-pay

## 2022-07-09 DIAGNOSIS — C770 Secondary and unspecified malignant neoplasm of lymph nodes of head, face and neck: Secondary | ICD-10-CM | POA: Diagnosis not present

## 2022-07-09 DIAGNOSIS — C4442 Squamous cell carcinoma of skin of scalp and neck: Secondary | ICD-10-CM | POA: Diagnosis not present

## 2022-07-09 DIAGNOSIS — Z510 Encounter for antineoplastic radiation therapy: Secondary | ICD-10-CM | POA: Diagnosis not present

## 2022-07-09 DIAGNOSIS — C07 Malignant neoplasm of parotid gland: Secondary | ICD-10-CM | POA: Diagnosis not present

## 2022-07-09 LAB — RAD ONC ARIA SESSION SUMMARY
Course Elapsed Days: 29
Plan Fractions Treated to Date: 18
Plan Prescribed Dose Per Fraction: 2 Gy
Plan Total Fractions Prescribed: 35
Plan Total Prescribed Dose: 70 Gy
Reference Point Dosage Given to Date: 36 Gy
Reference Point Session Dosage Given: 2 Gy
Session Number: 18

## 2022-07-10 ENCOUNTER — Ambulatory Visit
Admission: RE | Admit: 2022-07-10 | Discharge: 2022-07-10 | Disposition: A | Discharge status: Home / Self Care | Payer: Medicare HMO | Source: Ambulatory Visit | Attending: Radiation Oncology | Admitting: Radiation Oncology

## 2022-07-10 ENCOUNTER — Other Ambulatory Visit: Payer: Self-pay

## 2022-07-10 DIAGNOSIS — C770 Secondary and unspecified malignant neoplasm of lymph nodes of head, face and neck: Secondary | ICD-10-CM | POA: Diagnosis not present

## 2022-07-10 DIAGNOSIS — C4442 Squamous cell carcinoma of skin of scalp and neck: Secondary | ICD-10-CM | POA: Diagnosis not present

## 2022-07-10 DIAGNOSIS — Z510 Encounter for antineoplastic radiation therapy: Secondary | ICD-10-CM | POA: Diagnosis not present

## 2022-07-10 DIAGNOSIS — C07 Malignant neoplasm of parotid gland: Secondary | ICD-10-CM | POA: Diagnosis not present

## 2022-07-10 LAB — RAD ONC ARIA SESSION SUMMARY
Course Elapsed Days: 30
Plan Fractions Treated to Date: 19
Plan Prescribed Dose Per Fraction: 2 Gy
Plan Total Fractions Prescribed: 35
Plan Total Prescribed Dose: 70 Gy
Reference Point Dosage Given to Date: 38 Gy
Reference Point Session Dosage Given: 2 Gy
Session Number: 19

## 2022-07-11 ENCOUNTER — Other Ambulatory Visit: Payer: Self-pay | Admitting: *Deleted

## 2022-07-11 ENCOUNTER — Other Ambulatory Visit: Payer: Self-pay

## 2022-07-11 ENCOUNTER — Ambulatory Visit
Admission: RE | Admit: 2022-07-11 | Discharge: 2022-07-11 | Disposition: A | Discharge status: Home / Self Care | Payer: Medicare HMO | Source: Ambulatory Visit | Attending: Radiation Oncology | Admitting: Radiation Oncology

## 2022-07-11 DIAGNOSIS — C770 Secondary and unspecified malignant neoplasm of lymph nodes of head, face and neck: Secondary | ICD-10-CM | POA: Diagnosis not present

## 2022-07-11 DIAGNOSIS — C4442 Squamous cell carcinoma of skin of scalp and neck: Secondary | ICD-10-CM | POA: Diagnosis not present

## 2022-07-11 DIAGNOSIS — C07 Malignant neoplasm of parotid gland: Secondary | ICD-10-CM | POA: Diagnosis not present

## 2022-07-11 DIAGNOSIS — K5909 Other constipation: Secondary | ICD-10-CM | POA: Diagnosis not present

## 2022-07-11 DIAGNOSIS — R159 Full incontinence of feces: Secondary | ICD-10-CM | POA: Diagnosis not present

## 2022-07-11 DIAGNOSIS — Z510 Encounter for antineoplastic radiation therapy: Secondary | ICD-10-CM | POA: Diagnosis not present

## 2022-07-11 LAB — RAD ONC ARIA SESSION SUMMARY
Course Elapsed Days: 31
Plan Fractions Treated to Date: 20
Plan Prescribed Dose Per Fraction: 2 Gy
Plan Total Fractions Prescribed: 35
Plan Total Prescribed Dose: 70 Gy
Reference Point Dosage Given to Date: 40 Gy
Reference Point Session Dosage Given: 2 Gy
Session Number: 20

## 2022-07-13 ENCOUNTER — Ambulatory Visit: Payer: Medicare HMO

## 2022-07-14 ENCOUNTER — Ambulatory Visit
Admission: RE | Admit: 2022-07-14 | Discharge: 2022-07-14 | Disposition: A | Discharge status: Home / Self Care | Payer: Medicare HMO | Source: Ambulatory Visit | Attending: Radiation Oncology | Admitting: Radiation Oncology

## 2022-07-14 ENCOUNTER — Other Ambulatory Visit: Payer: Self-pay

## 2022-07-14 DIAGNOSIS — C07 Malignant neoplasm of parotid gland: Secondary | ICD-10-CM | POA: Diagnosis not present

## 2022-07-14 DIAGNOSIS — C770 Secondary and unspecified malignant neoplasm of lymph nodes of head, face and neck: Secondary | ICD-10-CM | POA: Diagnosis not present

## 2022-07-14 DIAGNOSIS — C4442 Squamous cell carcinoma of skin of scalp and neck: Secondary | ICD-10-CM | POA: Diagnosis not present

## 2022-07-14 DIAGNOSIS — Z510 Encounter for antineoplastic radiation therapy: Secondary | ICD-10-CM | POA: Diagnosis not present

## 2022-07-14 LAB — RAD ONC ARIA SESSION SUMMARY
Course Elapsed Days: 34
Plan Fractions Treated to Date: 21
Plan Prescribed Dose Per Fraction: 2 Gy
Plan Total Fractions Prescribed: 35
Plan Total Prescribed Dose: 70 Gy
Reference Point Dosage Given to Date: 42 Gy
Reference Point Session Dosage Given: 2 Gy
Session Number: 21

## 2022-07-15 ENCOUNTER — Inpatient Hospital Stay: Payer: Medicare HMO | Attending: Radiation Oncology

## 2022-07-15 ENCOUNTER — Ambulatory Visit
Admission: RE | Admit: 2022-07-15 | Discharge: 2022-07-15 | Disposition: A | Discharge status: Home / Self Care | Payer: Medicare HMO | Source: Ambulatory Visit | Attending: Radiation Oncology | Admitting: Radiation Oncology

## 2022-07-15 ENCOUNTER — Other Ambulatory Visit: Payer: Self-pay

## 2022-07-15 DIAGNOSIS — C07 Malignant neoplasm of parotid gland: Secondary | ICD-10-CM | POA: Diagnosis not present

## 2022-07-15 DIAGNOSIS — Z510 Encounter for antineoplastic radiation therapy: Secondary | ICD-10-CM | POA: Diagnosis not present

## 2022-07-15 DIAGNOSIS — C4442 Squamous cell carcinoma of skin of scalp and neck: Secondary | ICD-10-CM | POA: Diagnosis not present

## 2022-07-15 DIAGNOSIS — C770 Secondary and unspecified malignant neoplasm of lymph nodes of head, face and neck: Secondary | ICD-10-CM | POA: Diagnosis not present

## 2022-07-15 LAB — RAD ONC ARIA SESSION SUMMARY
Course Elapsed Days: 35
Plan Fractions Treated to Date: 22
Plan Prescribed Dose Per Fraction: 2 Gy
Plan Total Fractions Prescribed: 35
Plan Total Prescribed Dose: 70 Gy
Reference Point Dosage Given to Date: 44 Gy
Reference Point Session Dosage Given: 2 Gy
Session Number: 22

## 2022-07-16 ENCOUNTER — Other Ambulatory Visit: Payer: Self-pay

## 2022-07-16 ENCOUNTER — Ambulatory Visit
Admission: RE | Admit: 2022-07-16 | Discharge: 2022-07-16 | Disposition: A | Discharge status: Home / Self Care | Payer: Medicare HMO | Source: Ambulatory Visit | Attending: Radiation Oncology | Admitting: Radiation Oncology

## 2022-07-16 DIAGNOSIS — C4442 Squamous cell carcinoma of skin of scalp and neck: Secondary | ICD-10-CM | POA: Diagnosis not present

## 2022-07-16 DIAGNOSIS — Z510 Encounter for antineoplastic radiation therapy: Secondary | ICD-10-CM | POA: Diagnosis not present

## 2022-07-16 DIAGNOSIS — C770 Secondary and unspecified malignant neoplasm of lymph nodes of head, face and neck: Secondary | ICD-10-CM | POA: Diagnosis not present

## 2022-07-16 DIAGNOSIS — C07 Malignant neoplasm of parotid gland: Secondary | ICD-10-CM | POA: Diagnosis not present

## 2022-07-16 LAB — RAD ONC ARIA SESSION SUMMARY
Course Elapsed Days: 36
Plan Fractions Treated to Date: 23
Plan Prescribed Dose Per Fraction: 2 Gy
Plan Total Fractions Prescribed: 35
Plan Total Prescribed Dose: 70 Gy
Reference Point Dosage Given to Date: 46 Gy
Reference Point Session Dosage Given: 2 Gy
Session Number: 23

## 2022-07-17 ENCOUNTER — Other Ambulatory Visit: Payer: Self-pay

## 2022-07-17 ENCOUNTER — Ambulatory Visit
Admission: RE | Admit: 2022-07-17 | Discharge: 2022-07-17 | Disposition: A | Discharge status: Home / Self Care | Payer: Medicare HMO | Source: Ambulatory Visit | Attending: Radiation Oncology | Admitting: Radiation Oncology

## 2022-07-17 DIAGNOSIS — C4442 Squamous cell carcinoma of skin of scalp and neck: Secondary | ICD-10-CM | POA: Diagnosis not present

## 2022-07-17 DIAGNOSIS — Z510 Encounter for antineoplastic radiation therapy: Secondary | ICD-10-CM | POA: Diagnosis not present

## 2022-07-17 DIAGNOSIS — C770 Secondary and unspecified malignant neoplasm of lymph nodes of head, face and neck: Secondary | ICD-10-CM | POA: Diagnosis not present

## 2022-07-17 DIAGNOSIS — C07 Malignant neoplasm of parotid gland: Secondary | ICD-10-CM | POA: Diagnosis not present

## 2022-07-17 LAB — RAD ONC ARIA SESSION SUMMARY
Course Elapsed Days: 37
Plan Fractions Treated to Date: 24
Plan Prescribed Dose Per Fraction: 2 Gy
Plan Total Fractions Prescribed: 35
Plan Total Prescribed Dose: 70 Gy
Reference Point Dosage Given to Date: 48 Gy
Reference Point Session Dosage Given: 2 Gy
Session Number: 24

## 2022-07-18 ENCOUNTER — Telehealth: Payer: Self-pay | Admitting: *Deleted

## 2022-07-18 ENCOUNTER — Ambulatory Visit: Payer: Medicare HMO

## 2022-07-18 DIAGNOSIS — K5900 Constipation, unspecified: Secondary | ICD-10-CM | POA: Diagnosis not present

## 2022-07-18 DIAGNOSIS — R110 Nausea: Secondary | ICD-10-CM | POA: Diagnosis not present

## 2022-07-18 DIAGNOSIS — R233 Spontaneous ecchymoses: Secondary | ICD-10-CM | POA: Diagnosis not present

## 2022-07-18 NOTE — Telephone Encounter (Signed)
Patient daughter called reporting and concerned about "blood blisters" that patient has developed and is still developing over the past week. First one came on his ankle and is now healed, but he now has them on the center of his chest, on his back and 3 behind his ears. She does not think he is saying anything to [place] about it. She is also reporting that he is having nausea and does not have any medicine for it Please check patient when he comes in today at  and she would appreciate if something could be done for him.

## 2022-07-18 NOTE — Telephone Encounter (Signed)
I called daughter Mariann Laster back and advised that she should call PCP for these concerns as per staff this is not related to his radiation therapy She sis not understand since it occurred after he started radiation therapy, but will call his PCP

## 2022-07-19 ENCOUNTER — Ambulatory Visit: Payer: Medicare HMO

## 2022-07-21 ENCOUNTER — Ambulatory Visit
Admission: RE | Admit: 2022-07-21 | Discharge: 2022-07-21 | Disposition: A | Discharge status: Home / Self Care | Payer: Medicare HMO | Source: Ambulatory Visit | Attending: Radiation Oncology | Admitting: Radiation Oncology

## 2022-07-21 ENCOUNTER — Other Ambulatory Visit: Payer: Self-pay

## 2022-07-21 DIAGNOSIS — C4442 Squamous cell carcinoma of skin of scalp and neck: Secondary | ICD-10-CM | POA: Diagnosis not present

## 2022-07-21 DIAGNOSIS — C07 Malignant neoplasm of parotid gland: Secondary | ICD-10-CM | POA: Diagnosis not present

## 2022-07-21 DIAGNOSIS — Z510 Encounter for antineoplastic radiation therapy: Secondary | ICD-10-CM | POA: Diagnosis not present

## 2022-07-21 DIAGNOSIS — C770 Secondary and unspecified malignant neoplasm of lymph nodes of head, face and neck: Secondary | ICD-10-CM | POA: Diagnosis not present

## 2022-07-21 LAB — RAD ONC ARIA SESSION SUMMARY
Course Elapsed Days: 41
Plan Fractions Treated to Date: 25
Plan Prescribed Dose Per Fraction: 2 Gy
Plan Total Fractions Prescribed: 35
Plan Total Prescribed Dose: 70 Gy
Reference Point Dosage Given to Date: 50 Gy
Reference Point Session Dosage Given: 2 Gy
Session Number: 25

## 2022-07-22 ENCOUNTER — Ambulatory Visit
Admission: RE | Admit: 2022-07-22 | Discharge: 2022-07-22 | Disposition: A | Discharge status: Home / Self Care | Payer: Medicare HMO | Source: Ambulatory Visit | Attending: Radiation Oncology | Admitting: Radiation Oncology

## 2022-07-22 ENCOUNTER — Other Ambulatory Visit: Payer: Self-pay

## 2022-07-22 ENCOUNTER — Inpatient Hospital Stay: Payer: Medicare HMO

## 2022-07-22 DIAGNOSIS — Z510 Encounter for antineoplastic radiation therapy: Secondary | ICD-10-CM | POA: Diagnosis not present

## 2022-07-22 DIAGNOSIS — C4442 Squamous cell carcinoma of skin of scalp and neck: Secondary | ICD-10-CM | POA: Diagnosis not present

## 2022-07-22 DIAGNOSIS — C07 Malignant neoplasm of parotid gland: Secondary | ICD-10-CM | POA: Diagnosis not present

## 2022-07-22 DIAGNOSIS — C770 Secondary and unspecified malignant neoplasm of lymph nodes of head, face and neck: Secondary | ICD-10-CM | POA: Diagnosis not present

## 2022-07-22 LAB — RAD ONC ARIA SESSION SUMMARY
Course Elapsed Days: 42
Plan Fractions Treated to Date: 26
Plan Prescribed Dose Per Fraction: 2 Gy
Plan Total Fractions Prescribed: 35
Plan Total Prescribed Dose: 70 Gy
Reference Point Dosage Given to Date: 52 Gy
Reference Point Session Dosage Given: 2 Gy
Session Number: 26

## 2022-07-23 ENCOUNTER — Other Ambulatory Visit: Payer: Self-pay

## 2022-07-23 ENCOUNTER — Ambulatory Visit
Admission: RE | Admit: 2022-07-23 | Discharge: 2022-07-23 | Disposition: A | Discharge status: Home / Self Care | Payer: Medicare HMO | Source: Ambulatory Visit | Attending: Radiation Oncology | Admitting: Radiation Oncology

## 2022-07-23 DIAGNOSIS — Z510 Encounter for antineoplastic radiation therapy: Secondary | ICD-10-CM | POA: Diagnosis not present

## 2022-07-23 DIAGNOSIS — C770 Secondary and unspecified malignant neoplasm of lymph nodes of head, face and neck: Secondary | ICD-10-CM | POA: Diagnosis not present

## 2022-07-23 DIAGNOSIS — C4442 Squamous cell carcinoma of skin of scalp and neck: Secondary | ICD-10-CM | POA: Diagnosis not present

## 2022-07-23 DIAGNOSIS — C07 Malignant neoplasm of parotid gland: Secondary | ICD-10-CM | POA: Diagnosis not present

## 2022-07-23 LAB — RAD ONC ARIA SESSION SUMMARY
Course Elapsed Days: 43
Plan Fractions Treated to Date: 27
Plan Prescribed Dose Per Fraction: 2 Gy
Plan Total Fractions Prescribed: 35
Plan Total Prescribed Dose: 70 Gy
Reference Point Dosage Given to Date: 54 Gy
Reference Point Session Dosage Given: 2 Gy
Session Number: 27

## 2022-07-24 ENCOUNTER — Other Ambulatory Visit: Payer: Self-pay

## 2022-07-24 ENCOUNTER — Ambulatory Visit
Admission: RE | Admit: 2022-07-24 | Discharge: 2022-07-24 | Disposition: A | Discharge status: Home / Self Care | Payer: Medicare HMO | Source: Ambulatory Visit | Attending: Radiation Oncology | Admitting: Radiation Oncology

## 2022-07-24 DIAGNOSIS — Z510 Encounter for antineoplastic radiation therapy: Secondary | ICD-10-CM | POA: Diagnosis not present

## 2022-07-24 DIAGNOSIS — C4442 Squamous cell carcinoma of skin of scalp and neck: Secondary | ICD-10-CM | POA: Diagnosis not present

## 2022-07-24 DIAGNOSIS — C07 Malignant neoplasm of parotid gland: Secondary | ICD-10-CM | POA: Diagnosis not present

## 2022-07-24 DIAGNOSIS — C770 Secondary and unspecified malignant neoplasm of lymph nodes of head, face and neck: Secondary | ICD-10-CM | POA: Diagnosis not present

## 2022-07-24 LAB — RAD ONC ARIA SESSION SUMMARY
Course Elapsed Days: 44
Plan Fractions Treated to Date: 28
Plan Prescribed Dose Per Fraction: 2 Gy
Plan Total Fractions Prescribed: 35
Plan Total Prescribed Dose: 70 Gy
Reference Point Dosage Given to Date: 56 Gy
Reference Point Session Dosage Given: 2 Gy
Session Number: 28

## 2022-07-25 ENCOUNTER — Ambulatory Visit
Admission: RE | Admit: 2022-07-25 | Discharge: 2022-07-25 | Disposition: A | Discharge status: Home / Self Care | Payer: Medicare HMO | Source: Ambulatory Visit | Attending: Radiation Oncology | Admitting: Radiation Oncology

## 2022-07-25 ENCOUNTER — Other Ambulatory Visit: Payer: Self-pay

## 2022-07-25 DIAGNOSIS — C07 Malignant neoplasm of parotid gland: Secondary | ICD-10-CM | POA: Diagnosis not present

## 2022-07-25 DIAGNOSIS — Z510 Encounter for antineoplastic radiation therapy: Secondary | ICD-10-CM | POA: Diagnosis not present

## 2022-07-25 DIAGNOSIS — C770 Secondary and unspecified malignant neoplasm of lymph nodes of head, face and neck: Secondary | ICD-10-CM | POA: Diagnosis not present

## 2022-07-25 DIAGNOSIS — C4442 Squamous cell carcinoma of skin of scalp and neck: Secondary | ICD-10-CM | POA: Diagnosis not present

## 2022-07-25 LAB — RAD ONC ARIA SESSION SUMMARY
Course Elapsed Days: 45
Plan Fractions Treated to Date: 29
Plan Prescribed Dose Per Fraction: 2 Gy
Plan Total Fractions Prescribed: 35
Plan Total Prescribed Dose: 70 Gy
Reference Point Dosage Given to Date: 58 Gy
Reference Point Session Dosage Given: 2 Gy
Session Number: 29

## 2022-07-28 ENCOUNTER — Other Ambulatory Visit: Payer: Self-pay

## 2022-07-28 ENCOUNTER — Ambulatory Visit
Admission: RE | Admit: 2022-07-28 | Discharge: 2022-07-28 | Disposition: A | Discharge status: Home / Self Care | Payer: Medicare HMO | Source: Ambulatory Visit | Attending: Radiation Oncology | Admitting: Radiation Oncology

## 2022-07-28 DIAGNOSIS — C4442 Squamous cell carcinoma of skin of scalp and neck: Secondary | ICD-10-CM | POA: Diagnosis not present

## 2022-07-28 DIAGNOSIS — Z510 Encounter for antineoplastic radiation therapy: Secondary | ICD-10-CM | POA: Diagnosis not present

## 2022-07-28 DIAGNOSIS — C770 Secondary and unspecified malignant neoplasm of lymph nodes of head, face and neck: Secondary | ICD-10-CM | POA: Diagnosis not present

## 2022-07-28 DIAGNOSIS — C07 Malignant neoplasm of parotid gland: Secondary | ICD-10-CM | POA: Diagnosis not present

## 2022-07-28 LAB — RAD ONC ARIA SESSION SUMMARY
Course Elapsed Days: 48
Plan Fractions Treated to Date: 30
Plan Prescribed Dose Per Fraction: 2 Gy
Plan Total Fractions Prescribed: 35
Plan Total Prescribed Dose: 70 Gy
Reference Point Dosage Given to Date: 60 Gy
Reference Point Session Dosage Given: 2 Gy
Session Number: 30

## 2022-07-29 ENCOUNTER — Ambulatory Visit: Payer: Medicare HMO

## 2022-07-29 ENCOUNTER — Other Ambulatory Visit: Payer: Self-pay

## 2022-07-29 ENCOUNTER — Inpatient Hospital Stay: Payer: Medicare HMO

## 2022-07-29 DIAGNOSIS — Z510 Encounter for antineoplastic radiation therapy: Secondary | ICD-10-CM | POA: Diagnosis not present

## 2022-07-29 DIAGNOSIS — C4442 Squamous cell carcinoma of skin of scalp and neck: Secondary | ICD-10-CM | POA: Diagnosis not present

## 2022-07-29 DIAGNOSIS — C770 Secondary and unspecified malignant neoplasm of lymph nodes of head, face and neck: Secondary | ICD-10-CM | POA: Insufficient documentation

## 2022-07-29 DIAGNOSIS — C07 Malignant neoplasm of parotid gland: Secondary | ICD-10-CM | POA: Diagnosis not present

## 2022-07-29 LAB — RAD ONC ARIA SESSION SUMMARY
Course Elapsed Days: 49
Plan Fractions Treated to Date: 31
Plan Prescribed Dose Per Fraction: 2 Gy
Plan Total Fractions Prescribed: 35
Plan Total Prescribed Dose: 70 Gy
Reference Point Dosage Given to Date: 62 Gy
Reference Point Session Dosage Given: 2 Gy
Session Number: 31

## 2022-07-30 ENCOUNTER — Ambulatory Visit: Payer: Medicare HMO

## 2022-07-30 ENCOUNTER — Ambulatory Visit
Admission: RE | Admit: 2022-07-30 | Discharge: 2022-07-30 | Disposition: A | Discharge status: Home / Self Care | Payer: Medicare HMO | Source: Ambulatory Visit | Attending: Radiation Oncology | Admitting: Radiation Oncology

## 2022-07-30 ENCOUNTER — Other Ambulatory Visit: Payer: Self-pay

## 2022-07-30 DIAGNOSIS — C770 Secondary and unspecified malignant neoplasm of lymph nodes of head, face and neck: Secondary | ICD-10-CM | POA: Diagnosis not present

## 2022-07-30 DIAGNOSIS — C4442 Squamous cell carcinoma of skin of scalp and neck: Secondary | ICD-10-CM | POA: Diagnosis not present

## 2022-07-30 DIAGNOSIS — C07 Malignant neoplasm of parotid gland: Secondary | ICD-10-CM | POA: Diagnosis not present

## 2022-07-30 DIAGNOSIS — Z510 Encounter for antineoplastic radiation therapy: Secondary | ICD-10-CM | POA: Diagnosis not present

## 2022-07-30 LAB — RAD ONC ARIA SESSION SUMMARY
Course Elapsed Days: 50
Plan Fractions Treated to Date: 32
Plan Prescribed Dose Per Fraction: 2 Gy
Plan Total Fractions Prescribed: 35
Plan Total Prescribed Dose: 70 Gy
Reference Point Dosage Given to Date: 64 Gy
Reference Point Session Dosage Given: 2 Gy
Session Number: 32

## 2022-07-31 ENCOUNTER — Other Ambulatory Visit: Payer: Self-pay

## 2022-07-31 ENCOUNTER — Ambulatory Visit: Payer: Medicare HMO

## 2022-07-31 DIAGNOSIS — Z510 Encounter for antineoplastic radiation therapy: Secondary | ICD-10-CM | POA: Diagnosis not present

## 2022-07-31 DIAGNOSIS — C770 Secondary and unspecified malignant neoplasm of lymph nodes of head, face and neck: Secondary | ICD-10-CM | POA: Diagnosis not present

## 2022-07-31 DIAGNOSIS — C4442 Squamous cell carcinoma of skin of scalp and neck: Secondary | ICD-10-CM | POA: Diagnosis not present

## 2022-07-31 DIAGNOSIS — C07 Malignant neoplasm of parotid gland: Secondary | ICD-10-CM | POA: Diagnosis not present

## 2022-07-31 LAB — RAD ONC ARIA SESSION SUMMARY
Course Elapsed Days: 51
Plan Fractions Treated to Date: 33
Plan Prescribed Dose Per Fraction: 2 Gy
Plan Total Fractions Prescribed: 35
Plan Total Prescribed Dose: 70 Gy
Reference Point Dosage Given to Date: 66 Gy
Reference Point Session Dosage Given: 2 Gy
Session Number: 33

## 2022-08-01 ENCOUNTER — Ambulatory Visit: Payer: Medicare HMO

## 2022-08-01 ENCOUNTER — Ambulatory Visit
Admission: RE | Admit: 2022-08-01 | Discharge: 2022-08-01 | Disposition: A | Discharge status: Home / Self Care | Payer: Medicare HMO | Source: Ambulatory Visit | Attending: Radiation Oncology | Admitting: Radiation Oncology

## 2022-08-01 ENCOUNTER — Other Ambulatory Visit: Payer: Self-pay

## 2022-08-01 DIAGNOSIS — C770 Secondary and unspecified malignant neoplasm of lymph nodes of head, face and neck: Secondary | ICD-10-CM | POA: Diagnosis not present

## 2022-08-01 DIAGNOSIS — Z510 Encounter for antineoplastic radiation therapy: Secondary | ICD-10-CM | POA: Diagnosis not present

## 2022-08-01 DIAGNOSIS — C4442 Squamous cell carcinoma of skin of scalp and neck: Secondary | ICD-10-CM | POA: Diagnosis not present

## 2022-08-01 DIAGNOSIS — C07 Malignant neoplasm of parotid gland: Secondary | ICD-10-CM | POA: Diagnosis not present

## 2022-08-01 LAB — RAD ONC ARIA SESSION SUMMARY
Course Elapsed Days: 52
Plan Fractions Treated to Date: 34
Plan Prescribed Dose Per Fraction: 2 Gy
Plan Total Fractions Prescribed: 35
Plan Total Prescribed Dose: 70 Gy
Reference Point Dosage Given to Date: 68 Gy
Reference Point Session Dosage Given: 2 Gy
Session Number: 34

## 2022-08-04 ENCOUNTER — Ambulatory Visit
Admission: RE | Admit: 2022-08-04 | Discharge: 2022-08-04 | Disposition: A | Discharge status: Home / Self Care | Payer: Medicare HMO | Source: Ambulatory Visit | Attending: Radiation Oncology | Admitting: Radiation Oncology

## 2022-08-04 ENCOUNTER — Other Ambulatory Visit: Payer: Self-pay

## 2022-08-04 ENCOUNTER — Ambulatory Visit: Payer: Medicare HMO

## 2022-08-04 ENCOUNTER — Telehealth: Payer: Self-pay | Admitting: *Deleted

## 2022-08-04 DIAGNOSIS — C770 Secondary and unspecified malignant neoplasm of lymph nodes of head, face and neck: Secondary | ICD-10-CM | POA: Diagnosis not present

## 2022-08-04 DIAGNOSIS — C4442 Squamous cell carcinoma of skin of scalp and neck: Secondary | ICD-10-CM | POA: Diagnosis not present

## 2022-08-04 DIAGNOSIS — C07 Malignant neoplasm of parotid gland: Secondary | ICD-10-CM | POA: Diagnosis not present

## 2022-08-04 DIAGNOSIS — Z510 Encounter for antineoplastic radiation therapy: Secondary | ICD-10-CM | POA: Diagnosis not present

## 2022-08-04 LAB — RAD ONC ARIA SESSION SUMMARY
Course Elapsed Days: 55
Plan Fractions Treated to Date: 35
Plan Prescribed Dose Per Fraction: 2 Gy
Plan Total Fractions Prescribed: 35
Plan Total Prescribed Dose: 70 Gy
Reference Point Dosage Given to Date: 70 Gy
Reference Point Session Dosage Given: 2 Gy
Session Number: 35

## 2022-08-04 NOTE — Telephone Encounter (Signed)
Daughter Mariann Laster called asking if patient will have a CT scan before his follow up appointment to side effects how his cancer is doing . She requests a return call

## 2022-08-06 DIAGNOSIS — I509 Heart failure, unspecified: Secondary | ICD-10-CM | POA: Diagnosis not present

## 2022-08-06 DIAGNOSIS — E119 Type 2 diabetes mellitus without complications: Secondary | ICD-10-CM | POA: Diagnosis not present

## 2022-08-06 DIAGNOSIS — E1151 Type 2 diabetes mellitus with diabetic peripheral angiopathy without gangrene: Secondary | ICD-10-CM | POA: Diagnosis not present

## 2022-08-14 DIAGNOSIS — I2510 Atherosclerotic heart disease of native coronary artery without angina pectoris: Secondary | ICD-10-CM | POA: Diagnosis not present

## 2022-08-14 DIAGNOSIS — E876 Hypokalemia: Secondary | ICD-10-CM | POA: Diagnosis not present

## 2022-08-14 DIAGNOSIS — I509 Heart failure, unspecified: Secondary | ICD-10-CM | POA: Diagnosis not present

## 2022-08-14 DIAGNOSIS — E538 Deficiency of other specified B group vitamins: Secondary | ICD-10-CM | POA: Diagnosis not present

## 2022-08-14 DIAGNOSIS — B3781 Candidal esophagitis: Secondary | ICD-10-CM | POA: Diagnosis not present

## 2022-08-28 DIAGNOSIS — H6123 Impacted cerumen, bilateral: Secondary | ICD-10-CM | POA: Diagnosis not present

## 2022-08-28 DIAGNOSIS — I2510 Atherosclerotic heart disease of native coronary artery without angina pectoris: Secondary | ICD-10-CM | POA: Diagnosis not present

## 2022-08-28 DIAGNOSIS — I509 Heart failure, unspecified: Secondary | ICD-10-CM | POA: Diagnosis not present

## 2022-08-28 DIAGNOSIS — I482 Chronic atrial fibrillation, unspecified: Secondary | ICD-10-CM | POA: Diagnosis not present

## 2022-08-28 DIAGNOSIS — E11649 Type 2 diabetes mellitus with hypoglycemia without coma: Secondary | ICD-10-CM | POA: Diagnosis not present

## 2022-09-01 DIAGNOSIS — H6121 Impacted cerumen, right ear: Secondary | ICD-10-CM | POA: Diagnosis not present

## 2022-09-10 ENCOUNTER — Other Ambulatory Visit: Payer: Self-pay | Admitting: Surgery

## 2022-09-15 ENCOUNTER — Ambulatory Visit
Admission: RE | Admit: 2022-09-15 | Discharge: 2022-09-15 | Disposition: A | Discharge status: Home / Self Care | Payer: Medicare HMO | Source: Ambulatory Visit | Attending: Radiation Oncology | Admitting: Radiation Oncology

## 2022-09-15 ENCOUNTER — Other Ambulatory Visit: Payer: Self-pay | Admitting: *Deleted

## 2022-09-15 VITALS — BP 120/63 | HR 60 | Temp 97.7°F | Resp 17 | Wt 151.9 lb

## 2022-09-15 DIAGNOSIS — E1151 Type 2 diabetes mellitus with diabetic peripheral angiopathy without gangrene: Secondary | ICD-10-CM | POA: Diagnosis not present

## 2022-09-15 DIAGNOSIS — C770 Secondary and unspecified malignant neoplasm of lymph nodes of head, face and neck: Secondary | ICD-10-CM | POA: Insufficient documentation

## 2022-09-15 DIAGNOSIS — E11649 Type 2 diabetes mellitus with hypoglycemia without coma: Secondary | ICD-10-CM | POA: Diagnosis not present

## 2022-09-15 DIAGNOSIS — Z923 Personal history of irradiation: Secondary | ICD-10-CM | POA: Diagnosis not present

## 2022-09-15 DIAGNOSIS — C4442 Squamous cell carcinoma of skin of scalp and neck: Secondary | ICD-10-CM | POA: Diagnosis not present

## 2022-09-15 DIAGNOSIS — C07 Malignant neoplasm of parotid gland: Secondary | ICD-10-CM

## 2022-09-15 DIAGNOSIS — Z79899 Other long term (current) drug therapy: Secondary | ICD-10-CM | POA: Diagnosis not present

## 2022-09-15 NOTE — Progress Notes (Signed)
Radiation Oncology Follow up Note  Name: [name] [name]   Date:   09/15/2022 MRN:  [id number] DOB: 06-Jul-1929    This 88 y.o. male presents to the clinic today for 1 month follow-up status post external beam radiation therapy to his right neck status post resection at [facility] for squamous cell carcinoma of 1 of 3 nodes positive.Marland Kitchen  REFERRING PROVIDER: Rusty Aus, MD  HPI: Patient is a 88 year old male now seen at 1 month having completed external beam radiation therapy to his right parotid bed as well as right neck for squamous cell carcinoma of the skin status post resection at [facility] with 1 of 3 lymph nodes positive.  Seen today in routine follow-up he is doing well specifically denies any head and neck pain dysphagia or any new nodularity of the skin..  COMPLICATIONS OF TREATMENT: none  FOLLOW UP COMPLIANCE: keeps appointments   PHYSICAL EXAM:  BP 120/63 (Patient Position: Sitting)   Pulse 60   Temp 97.7 F (36.5 C)   Resp 17   Wt 151 lb 14.4 oz (68.9 kg)   SpO2 99%   BMI 23.10 kg/m  There are some firmness in the right posterior neck consistent with radiation changes and surgery.  No evidence of adenopathy in the head and neck region is noted.  No overt skin lesions are noted.  Well-developed well-nourished patient in NAD. HEENT reveals PERLA, EOMI, discs not visualized.  Oral cavity is clear. No oral mucosal lesions are identified. Neck is clear without evidence of cervical or supraclavicular adenopathy. Lungs are clear to A&P. Cardiac examination is essentially unremarkable with regular rate and rhythm without murmur rub or thrill. Abdomen is benign with no organomegaly or masses noted. Motor sensory and DTR levels are equal and symmetric in the upper and lower extremities. Cranial nerves II through XII are grossly intact. Proprioception is intact. No peripheral adenopathy or edema is identified. No motor or sensory levels are noted. Crude visual fields are within normal  range.  RADIOLOGY RESULTS: No current films for review CT scan of the head and neck ordered in 4 months  PLAN: Present time patient is doing well no significant side effects from postoperative radiation therapy to his head and neck region.  Of asked to see him back in 4 to 5 months with a CT scan of the head and neck soft tissue at that time.  I have also asked him to reestablish care with dermatology for continued observation and recommendations.  Patient and daughter both comprehend the recommendations well.  I would like to take this opportunity to thank you for allowing me to participate in the care of your patient.Noreene Filbert, MD

## 2022-09-22 DIAGNOSIS — I482 Chronic atrial fibrillation, unspecified: Secondary | ICD-10-CM | POA: Diagnosis not present

## 2022-09-22 DIAGNOSIS — Z23 Encounter for immunization: Secondary | ICD-10-CM | POA: Diagnosis not present

## 2022-09-22 DIAGNOSIS — Z0000 Encounter for general adult medical examination without abnormal findings: Secondary | ICD-10-CM | POA: Diagnosis not present

## 2022-09-22 DIAGNOSIS — I2510 Atherosclerotic heart disease of native coronary artery without angina pectoris: Secondary | ICD-10-CM | POA: Diagnosis not present

## 2022-09-22 DIAGNOSIS — I509 Heart failure, unspecified: Secondary | ICD-10-CM | POA: Diagnosis not present

## 2022-10-19 DIAGNOSIS — S40022A Contusion of left upper arm, initial encounter: Secondary | ICD-10-CM | POA: Diagnosis not present

## 2022-10-19 DIAGNOSIS — L299 Pruritus, unspecified: Secondary | ICD-10-CM | POA: Diagnosis not present

## 2022-10-19 DIAGNOSIS — S40021A Contusion of right upper arm, initial encounter: Secondary | ICD-10-CM | POA: Diagnosis not present

## 2022-10-21 DIAGNOSIS — I2510 Atherosclerotic heart disease of native coronary artery without angina pectoris: Secondary | ICD-10-CM | POA: Diagnosis not present

## 2022-10-21 DIAGNOSIS — I509 Heart failure, unspecified: Secondary | ICD-10-CM | POA: Diagnosis not present

## 2022-10-21 DIAGNOSIS — S40029A Contusion of unspecified upper arm, initial encounter: Secondary | ICD-10-CM | POA: Diagnosis not present

## 2022-10-27 ENCOUNTER — Encounter (INDEPENDENT_AMBULATORY_CARE_PROVIDER_SITE_OTHER): Payer: Self-pay

## 2022-11-02 IMAGING — CR DG CHEST 2V
2 series · 2 of 2 positions shown · non-contrast
Comparison: 09/28/2008 chest radiograph and prior studies

CLINICAL DATA: Acute chest pain and dizziness.

EXAM:
CHEST - 2 VIEW

[chest lat]
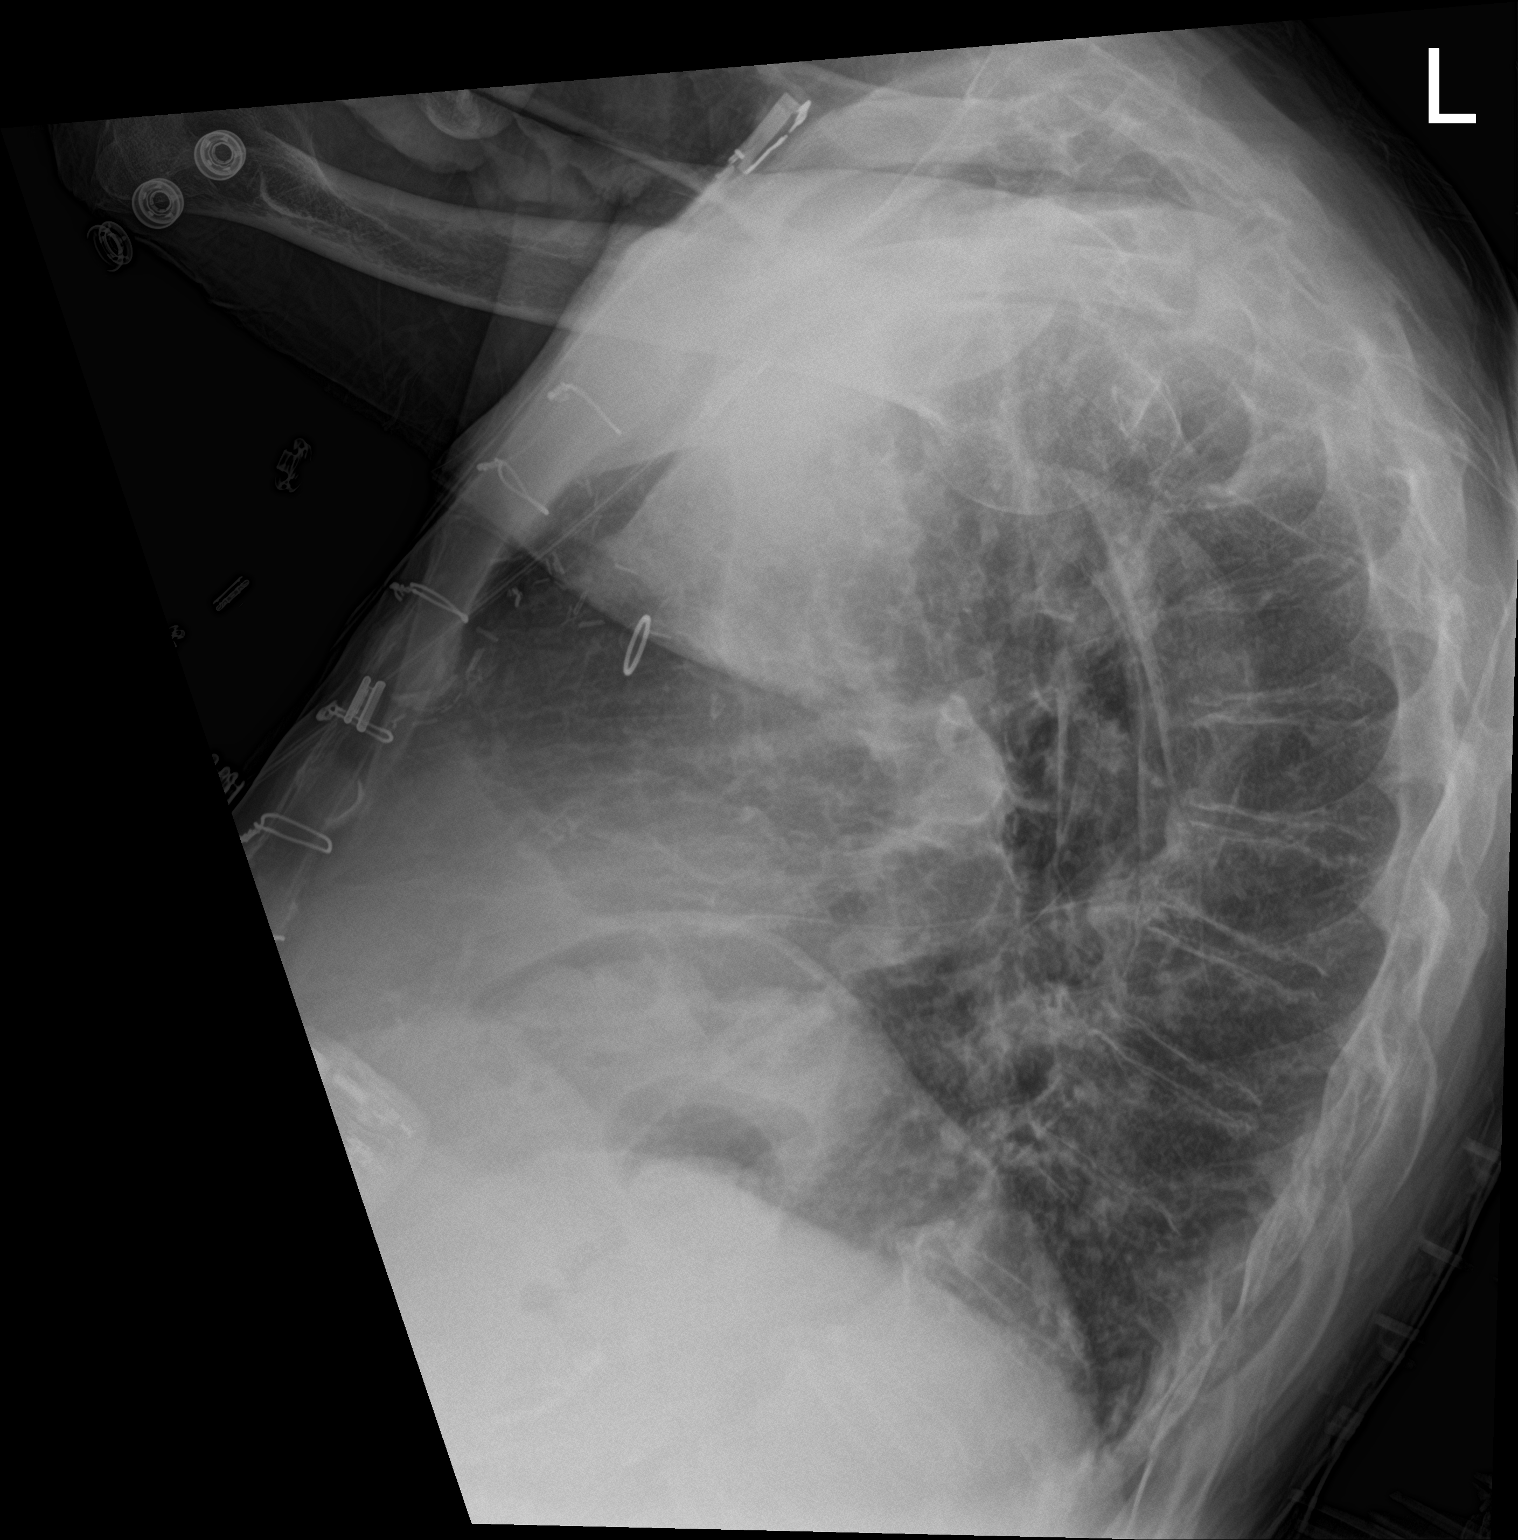

[chest ap]
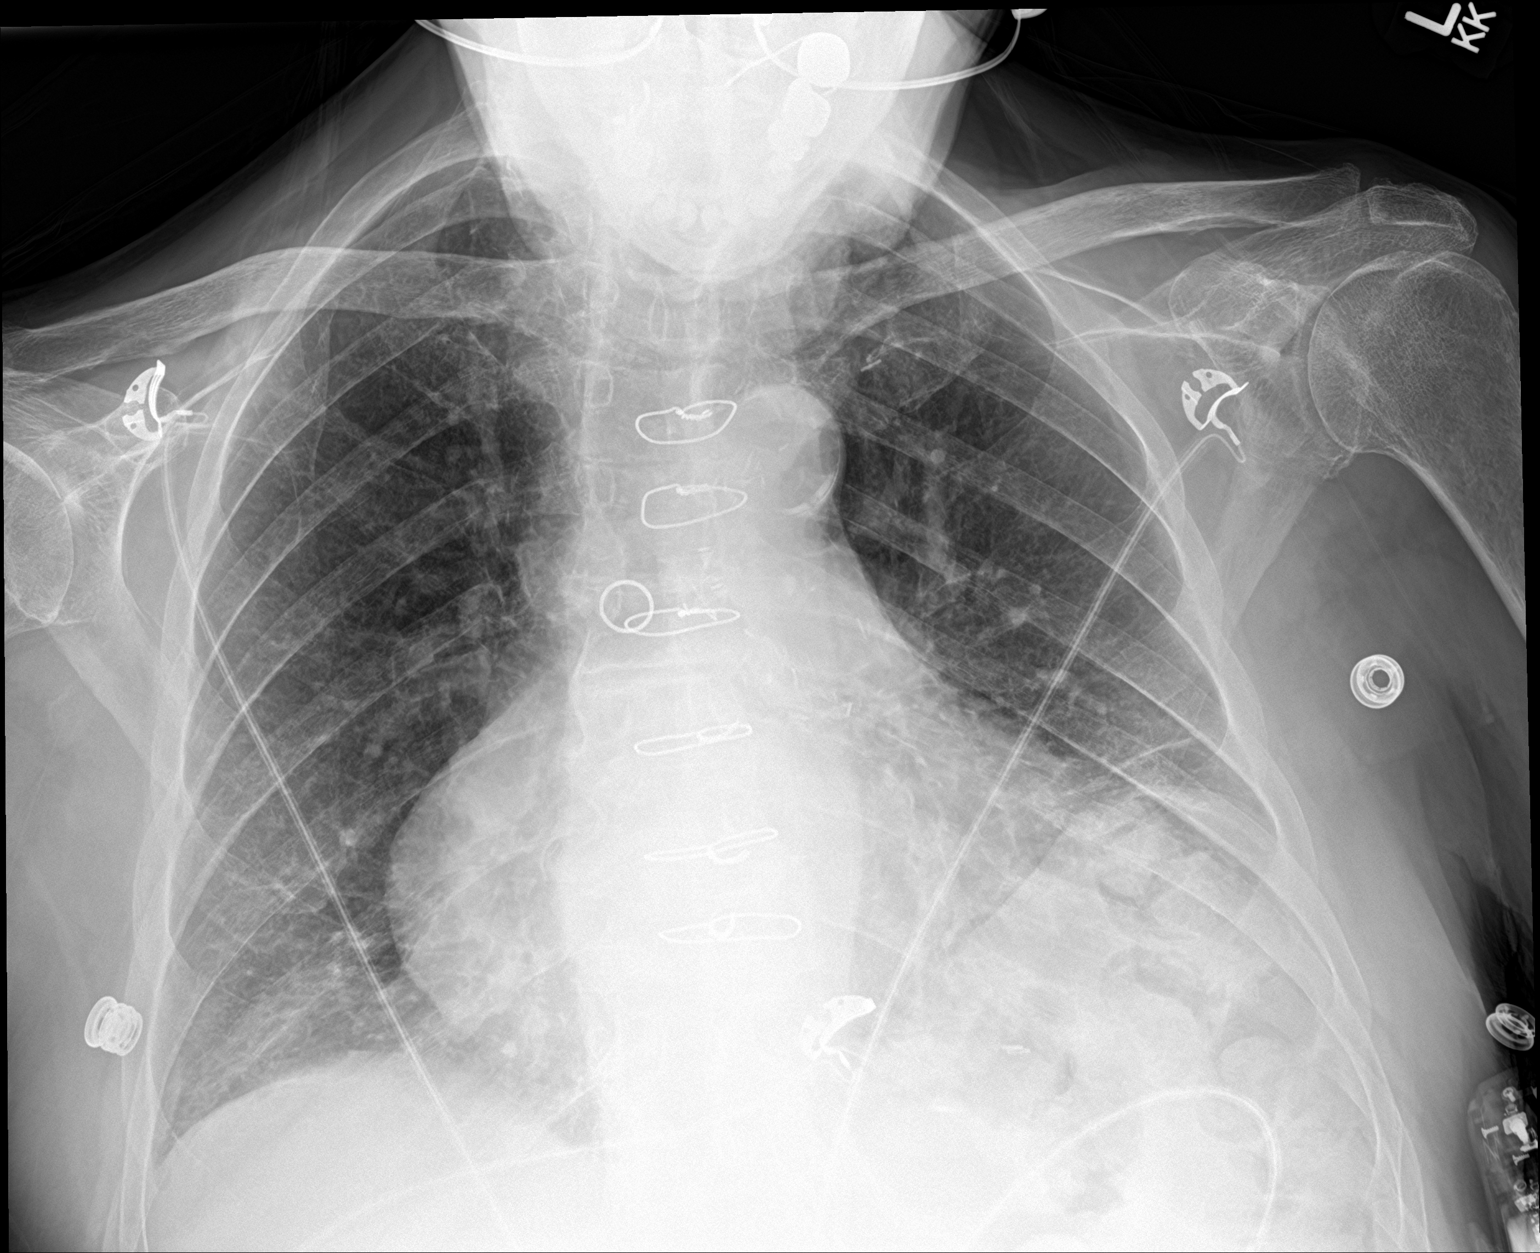

[2 of 2 positions shown; findings below may reference images not displayed]

FINDINGS: Cardiomegaly and CABG changes again noted.

Mild bibasilar opacities are noted with increasing LEFT
hemidiaphragm elevation.

There is no evidence of pulmonary edema, pneumothorax or pleural
effusion.

No acute bony abnormalities are identified.
IMPRESSION: 1. Mild bibasilar opacities, LEFT greater than RIGHT - favor
atelectasis over airspace disease.
2. Cardiomegaly.

## 2022-11-02 IMAGING — CT CT HEAD W/O CM
4 series · 17 of 47 positions shown, 19 images · non-contrast
Comparison: None.

CLINICAL DATA: 82-year-old male with acute dizziness.



[Series 2: head wo · axial · 0.43mm/px · z∈[-172,-52]mm · 7 of 33 slices shown, 9 images]
[im 5/33  brain]
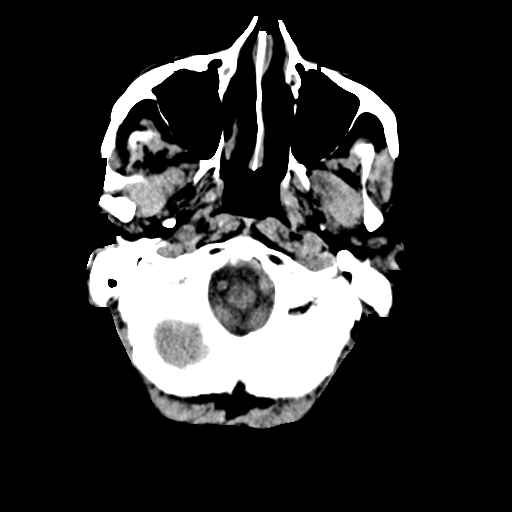
[im 5/33  bone]
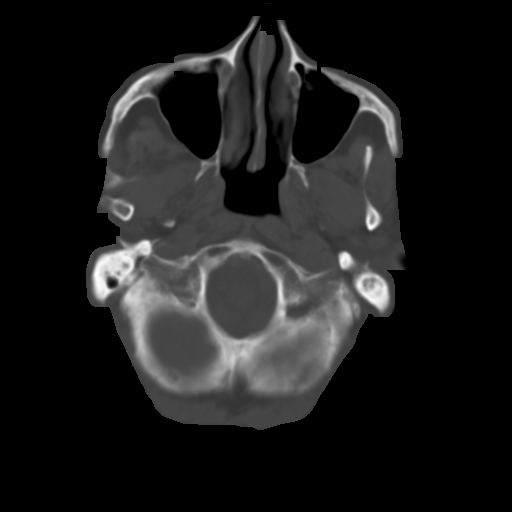
[im 9/33  brain]
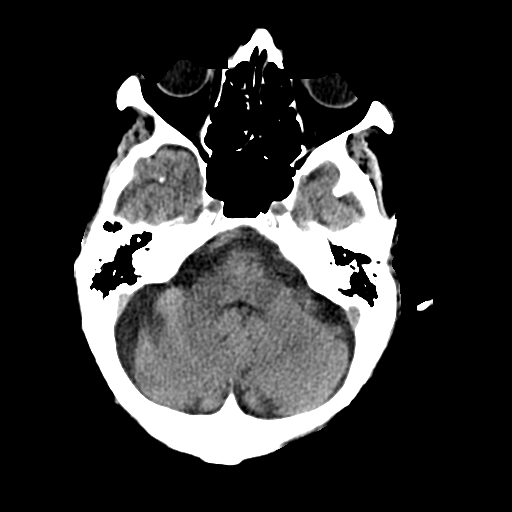
[im 13/33  brain]
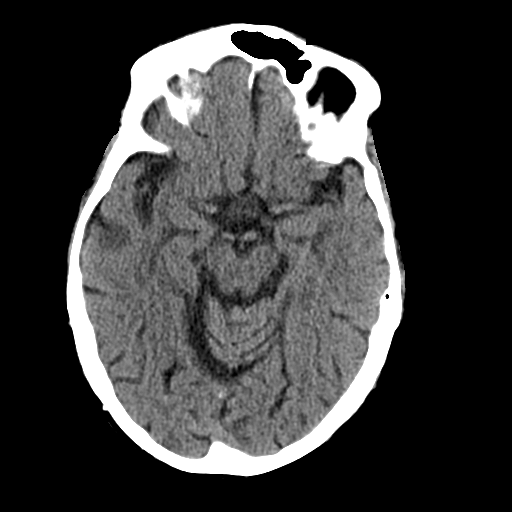
[im 17/33  brain]
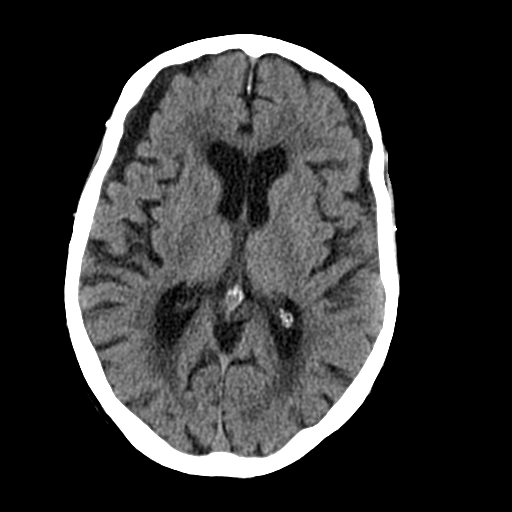
[im 21/33  brain]
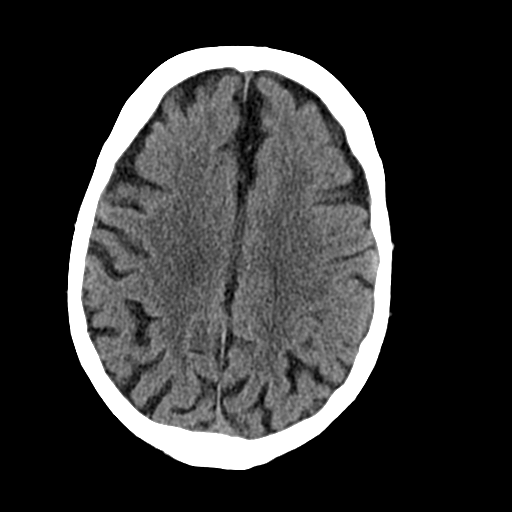
[im 21/33  bone]
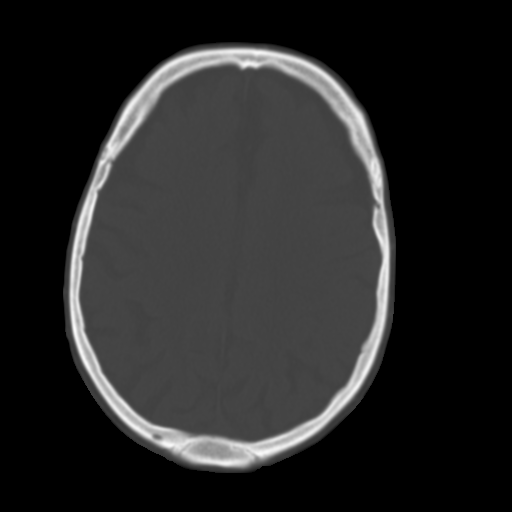
[im 25/33  brain]
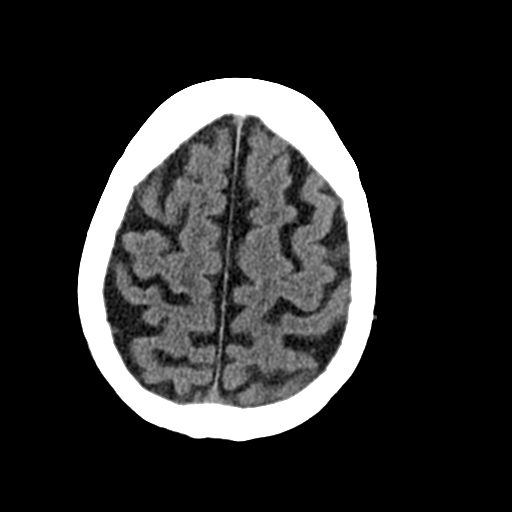
[im 29/33  brain]
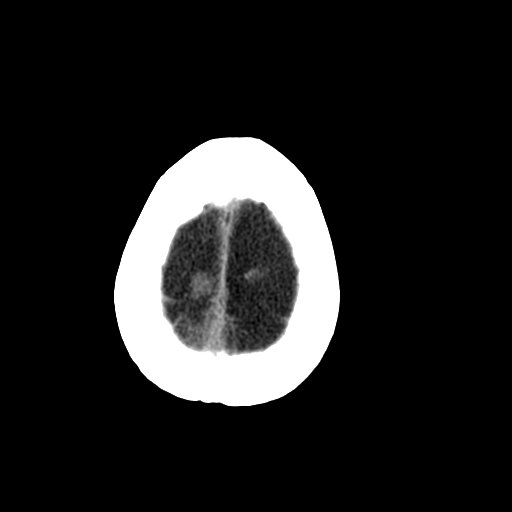

[Series 3: head bone · axial · 0.43mm/px · z∈[-176,-120]mm · 4 of 82 slices shown]
[im 9/82  bone]
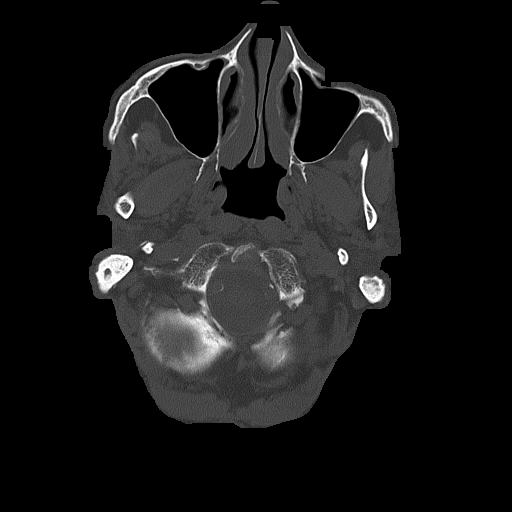
[im 17/82  bone]
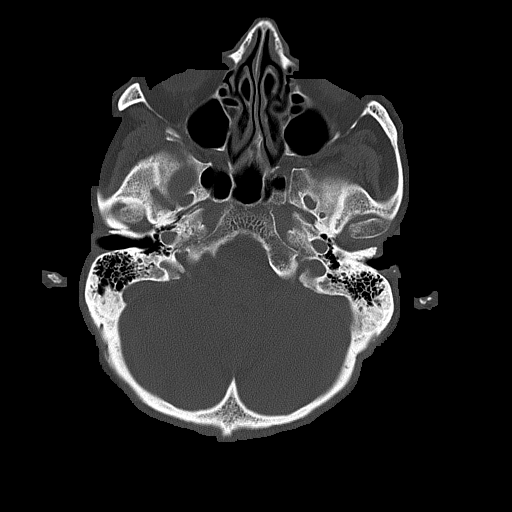
[im 25/82  bone]
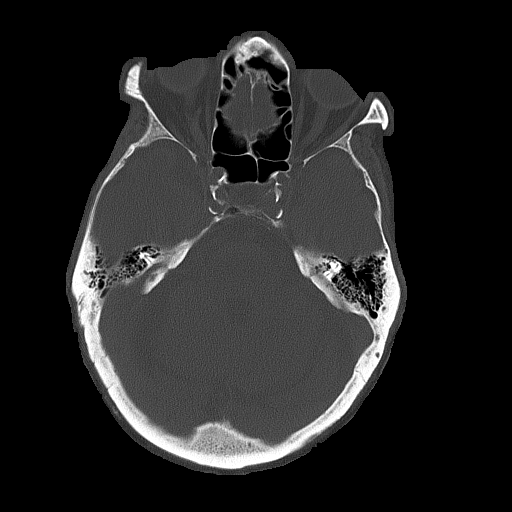
[im 37/82  bone]
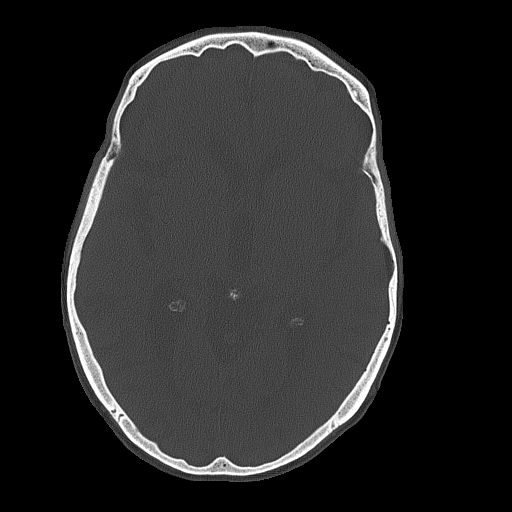

[Series 4: coronal soft tissue · coronal · 0.35mm/px · 3 of 71 slices shown]
[im 24/71  brain]
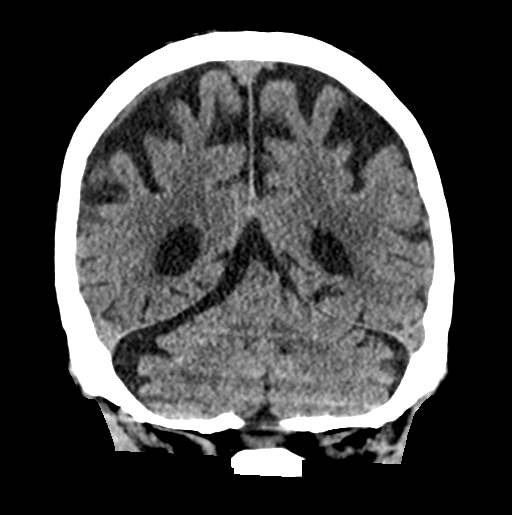
[im 32/71  brain]
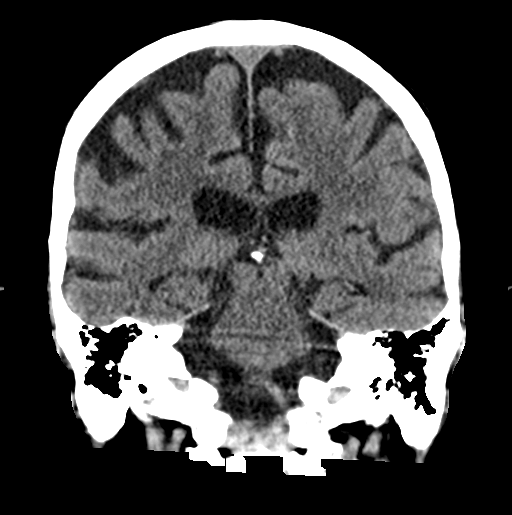
[im 39/71  brain]
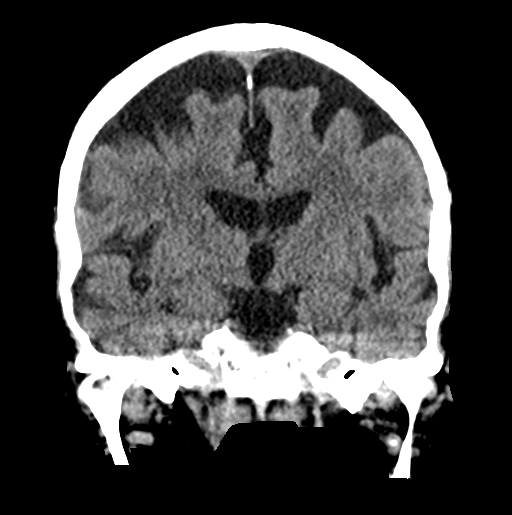

[Series 5: sagittal soft tissue · sagittal · 0.36mm/px · 3 of 56 slices shown]
[im 19/56  brain]
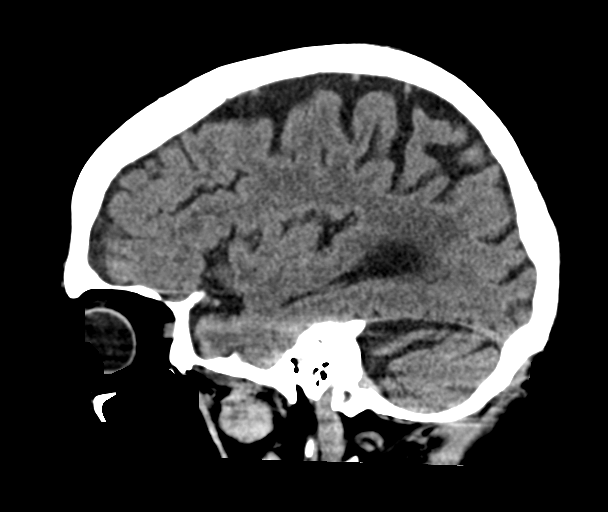
[im 28/56  brain]
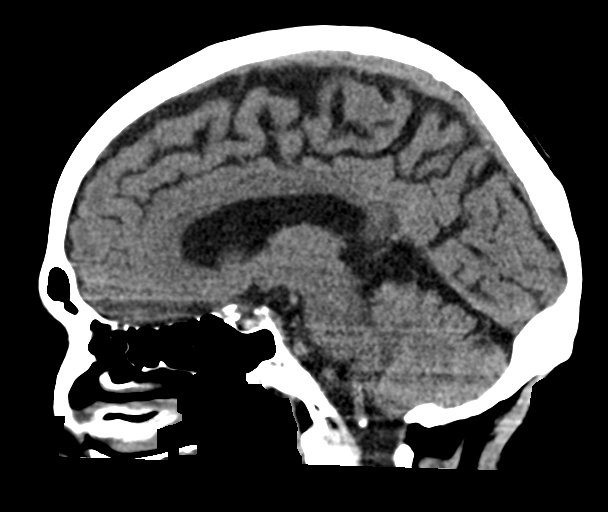
[im 37/56  brain]
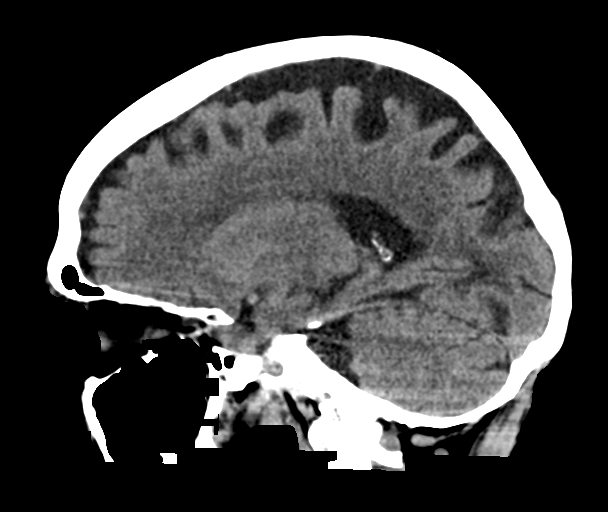

[17 of 47 positions shown; findings below may reference images not displayed]

FINDINGS: Brain: No evidence of acute infarction, midline shift, hemorrhage,
hydrocephalus, or mass lesion/mass effect.

Atrophy and probable chronic small-vessel white matter ischemic
changes noted. There are probable very small chronic bifrontal
subdural hygromas present.

Vascular: Carotid and vertebral atherosclerotic calcifications are
noted.

Skull: Normal. Negative for fracture or focal lesion.

Sinuses/Orbits: No acute finding.

Other: None.
IMPRESSION: 1. No evidence of acute intracranial abnormality.
2. Atrophy and probable chronic small-vessel white matter ischemic
changes.
3. Probable very small chronic bifrontal subdural hygromas. No
midline shift.

## 2022-11-24 DIAGNOSIS — Z0000 Encounter for general adult medical examination without abnormal findings: Secondary | ICD-10-CM | POA: Diagnosis not present

## 2022-11-24 DIAGNOSIS — I482 Chronic atrial fibrillation, unspecified: Secondary | ICD-10-CM | POA: Diagnosis not present

## 2022-11-24 DIAGNOSIS — I509 Heart failure, unspecified: Secondary | ICD-10-CM | POA: Diagnosis not present

## 2022-11-24 DIAGNOSIS — I2510 Atherosclerotic heart disease of native coronary artery without angina pectoris: Secondary | ICD-10-CM | POA: Diagnosis not present

## 2023-01-27 DIAGNOSIS — H6123 Impacted cerumen, bilateral: Secondary | ICD-10-CM | POA: Diagnosis not present

## 2023-01-27 DIAGNOSIS — H8110 Benign paroxysmal vertigo, unspecified ear: Secondary | ICD-10-CM | POA: Diagnosis not present

## 2023-02-20 ENCOUNTER — Emergency Department (HOSPITAL_COMMUNITY): Payer: Medicare HMO

## 2023-02-20 ENCOUNTER — Emergency Department (HOSPITAL_COMMUNITY)
Admission: EM | Admit: 2023-02-20 | Discharge: 2023-02-20 | Disposition: A | Discharge status: Home / Self Care | Payer: Medicare HMO | Attending: Emergency Medicine | Admitting: Emergency Medicine

## 2023-02-20 ENCOUNTER — Other Ambulatory Visit: Payer: Self-pay

## 2023-02-20 DIAGNOSIS — W1830XA Fall on same level, unspecified, initial encounter: Secondary | ICD-10-CM | POA: Diagnosis not present

## 2023-02-20 DIAGNOSIS — Z7982 Long term (current) use of aspirin: Secondary | ICD-10-CM | POA: Insufficient documentation

## 2023-02-20 DIAGNOSIS — R42 Dizziness and giddiness: Secondary | ICD-10-CM | POA: Insufficient documentation

## 2023-02-20 DIAGNOSIS — S299XXA Unspecified injury of thorax, initial encounter: Secondary | ICD-10-CM | POA: Diagnosis present

## 2023-02-20 DIAGNOSIS — Z7901 Long term (current) use of anticoagulants: Secondary | ICD-10-CM | POA: Insufficient documentation

## 2023-02-20 DIAGNOSIS — Y9301 Activity, walking, marching and hiking: Secondary | ICD-10-CM | POA: Diagnosis not present

## 2023-02-20 DIAGNOSIS — S2241XA Multiple fractures of ribs, right side, initial encounter for closed fracture: Secondary | ICD-10-CM | POA: Insufficient documentation

## 2023-02-20 DIAGNOSIS — I10 Essential (primary) hypertension: Secondary | ICD-10-CM | POA: Diagnosis not present

## 2023-02-20 DIAGNOSIS — S0990XA Unspecified injury of head, initial encounter: Secondary | ICD-10-CM | POA: Insufficient documentation

## 2023-02-20 DIAGNOSIS — W19XXXA Unspecified fall, initial encounter: Secondary | ICD-10-CM | POA: Diagnosis not present

## 2023-02-20 LAB — CBC WITH DIFFERENTIAL/PLATELET
Abs Immature Granulocytes: 0.12 10*3/uL — ABNORMAL HIGH (ref 0.00–0.07)
Basophils Absolute: 0.1 10*3/uL (ref 0.0–0.1)
Basophils Relative: 1 %
Eosinophils Absolute: 0.4 10*3/uL (ref 0.0–0.5)
Eosinophils Relative: 4 %
HCT: 32.4 % — ABNORMAL LOW (ref 39.0–52.0)
Hemoglobin: 11.3 g/dL — ABNORMAL LOW (ref 13.0–17.0)
Immature Granulocytes: 1 %
Lymphocytes Relative: 18 %
Lymphs Abs: 1.7 10*3/uL (ref 0.7–4.0)
MCH: 36.1 pg — ABNORMAL HIGH (ref 26.0–34.0)
MCHC: 34.9 g/dL (ref 30.0–36.0)
MCV: 103.5 fL — ABNORMAL HIGH (ref 80.0–100.0)
Monocytes Absolute: 1.3 10*3/uL — ABNORMAL HIGH (ref 0.1–1.0)
Monocytes Relative: 13 %
Neutro Abs: 6.1 10*3/uL (ref 1.7–7.7)
Neutrophils Relative %: 63 %
Platelets: 153 10*3/uL (ref 150–400)
RBC: 3.13 MIL/uL — ABNORMAL LOW (ref 4.22–5.81)
RDW: 20.4 % — ABNORMAL HIGH (ref 11.5–15.5)
WBC: 9.7 10*3/uL (ref 4.0–10.5)
nRBC: 0 % (ref 0.0–0.2)

## 2023-02-20 LAB — COMPREHENSIVE METABOLIC PANEL
ALT: 25 U/L (ref 0–44)
AST: 43 U/L — ABNORMAL HIGH (ref 15–41)
Albumin: 3.9 g/dL (ref 3.5–5.0)
Alkaline Phosphatase: 66 U/L (ref 38–126)
Anion gap: 11 (ref 5–15)
BUN: 34 mg/dL — ABNORMAL HIGH (ref 8–23)
CO2: 29 mmol/L (ref 22–32)
Calcium: 9.4 mg/dL (ref 8.9–10.3)
Chloride: 98 mmol/L (ref 98–111)
Creatinine, Ser: 1.28 mg/dL — ABNORMAL HIGH (ref 0.61–1.24)
GFR, Estimated: 53 mL/min — ABNORMAL LOW (ref >60)
Glucose, Bld: 125 mg/dL — ABNORMAL HIGH (ref 70–99)
Potassium: 3.3 mmol/L — ABNORMAL LOW (ref 3.5–5.1)
Sodium: 138 mmol/L (ref 135–145)
Total Bilirubin: 0.8 mg/dL (ref 0.3–1.2)
Total Protein: 7.4 g/dL (ref 6.5–8.1)

## 2023-02-20 LAB — PROTIME-INR
INR: 1.1 (ref 0.8–1.2)
Prothrombin Time: 14.3 seconds (ref 11.4–15.2)

## 2023-02-20 MED ORDER — LIDOCAINE 5 % EX PTCH
1.0000 | MEDICATED_PATCH | CUTANEOUS | Status: DC
  Administered 2023-02-20: 1 via TRANSDERMAL
  Filled 2023-02-20: qty 1

## 2023-02-20 MED ORDER — FENTANYL CITRATE PF 50 MCG/ML IJ SOSY: 50.0000 ug | PREFILLED_SYRINGE | Freq: Once | INTRAMUSCULAR | Status: DC | PRN

## 2023-02-20 MED ORDER — TIZANIDINE HCL 2 MG PO CAPS: 2.0000 mg | ORAL_CAPSULE | Freq: Three times a day (TID) | ORAL | 0 refills | Status: DC | PRN

## 2023-02-20 MED ORDER — ACETAMINOPHEN 500 MG PO TABS
1000.0000 mg | ORAL_TABLET | Freq: Once | ORAL | Status: AC
  Administered 2023-02-20: 1000 mg via ORAL
  Filled 2023-02-20: qty 2

## 2023-02-20 MED ORDER — TIZANIDINE HCL 4 MG PO TABS
4.0000 mg | ORAL_TABLET | Freq: Once | ORAL | Status: AC
  Administered 2023-02-20: 4 mg via ORAL
  Filled 2023-02-20: qty 1

## 2023-02-20 NOTE — Discharge Instructions (Signed)
We saw you in the emergency room after your fall.  X-ray of your ribs indicate that you have fractured your rib.   As discussed, rib fractures will lead to pain that would lead to patients often not taking deep breaths, which in turn can cause pneumonia and other complications.  In order to avoid that, make sure that you are taking deep breaths using the incentive spirometer every few minutes.  Take the pain medications that are prescribed.  Given the increased incidence of dizziness and falling, discussed with your physician if it is still safe for you to be on blood thinners.

## 2023-02-20 NOTE — ED Provider Notes (Signed)
[facility] Provider Note   CSN: [id number] Arrival date & time: 02/20/23  0815     History  Chief Complaint  Patient presents with   [name] [name] is a 89 y.o. male.  HPI     89 year old patient comes in with chief complaint of fall.  Patient has history of A-fib for which she is on Eliquis.  Patient states that he has had issues with balance and dizziness.  This morning, when he was walking back from going to the bathroom he had a sudden episode of spinning sensation that led him to fall.  In the process he struck the back of his head onto a table and also injured the right side of his ribs.  He is having chest pain with deep inspiration.  He denies any headache, neck pain, one-sided weakness or numbness and is currently not dizzy.  Patient denies any abdominal pain and he was able to get up with his own power at some point.  Patient lives by himself.  Level 2 trauma was activated on the field.   Home Medications Prior to Admission medications   Medication Sig Start Date End Date Taking? Authorizing Provider  tizanidine (ZANAFLEX) 2 MG capsule Take 1 capsule (2 mg total) by mouth 3 (three) times daily as needed for muscle spasms. 02/20/23  Yes Varney Biles, MD  acetaminophen (TYLENOL) 500 MG tablet Take 1,000 mg by mouth every 6 (six) hours as needed for moderate pain.    [provider]  apixaban (ELIQUIS) 5 MG TABS tablet Take 5 mg by mouth 2 (two) times daily.    [provider]  ASPIRIN LOW DOSE 81 MG tablet TAKE 1 TABLET(81 MG) BY MOUTH DAILY. SWALLOW WHOLE 09/12/22   Serafina Mitchell, MD  cetirizine (ZYRTEC) 10 MG tablet Take 10 mg by mouth daily as needed for allergies.    [provider]  Cyanocobalamin (VITAMIN B-12) 1000 MCG SUBL Take 1,000 mcg by mouth daily. 03/23/14   [provider]  ferrous sulfate 325 (65 FE) MG EC tablet Take 325 mg by mouth daily.    [provider]  levothyroxine (SYNTHROID, LEVOTHROID) 112 MCG tablet Take 112 mcg by mouth daily. 03/28/14   [provider]  magnesium hydroxide (MILK OF MAGNESIA) 400 MG/5ML suspension Take by mouth daily as needed for mild constipation.    [provider]  meclizine (ANTIVERT) 12.5 MG tablet Take 1 tablet (12.5 mg total) by mouth 3 (three) times daily as needed for dizziness. 05/22/22   Ward, Delice Bison, DO  metoprolol succinate (TOPROL-XL) 50 MG 24 hr tablet Take 50 mg by mouth daily. 03/28/14   [provider]  Multiple Vitamins-Minerals (MULTIVITAMIN WITH MINERALS) tablet Take 1 tablet by mouth daily. 06/11/16   [provider]  Omega-3 Fatty Acids (FISH OIL) 1000 MG CAPS Take 1,000 mg by mouth in the morning and at bedtime.    [provider]  omeprazole (PRILOSEC) 20 MG capsule Take 20 mg by mouth daily. 01/06/17 09/15/22  [provider]  polyethylene glycol (MIRALAX / GLYCOLAX) packet Take 17 g by mouth daily. Mix one tablespoon with 8oz of your favorite juice or water every day until you are having soft formed stools. Then start taking once daily if you didn't have a stool the day before. Patient not taking: Reported on 05/19/2022 06/01/18   Merlyn Lot, MD  potassium chloride (KLOR-CON) 10 MEQ tablet Take 10  mEq by mouth 2 (two) times daily. 05/28/22   [provider]  simvastatin (ZOCOR) 40 MG tablet Take 40 mg by mouth daily. 03/28/14   [provider]  torsemide (DEMADEX) 20 MG tablet Take 40 mg by mouth daily.    [provider]      Allergies    Cephalexin, Spironolactone, Codeine, and Doxycycline    Review of Systems   Review of Systems  All other systems reviewed and are negative.   Physical Exam Updated Vital Signs BP (!) 144/72   Pulse 62   Temp 97.8 F (36.6 C) (Oral)   Resp 19   Ht '5\' 8"'$  (1.727 m)   Wt 65.8 kg   SpO2 94%   BMI 22.05 kg/m  Physical Exam Vitals and nursing note reviewed.   Constitutional:      Appearance: He is well-developed.  HENT:     Head: Atraumatic.  Eyes:     Extraocular Movements: Extraocular movements intact.     Pupils: Pupils are equal, round, and reactive to light.  Neck:     Comments: No midline c-spine tenderness, pt able to turn head to 45 degrees bilaterally without any pain and able to flex neck to the chest and extend without any pain or neurologic symptoms.  Cardiovascular:     Rate and Rhythm: Normal rate.  Pulmonary:     Effort: Pulmonary effort is normal.  Abdominal:     Tenderness: There is no abdominal tenderness.  Musculoskeletal:        General: No deformity.     Cervical back: Neck supple.     Comments: Right lower anterolateral chest wall ecchymosis with tenderness to palpation. Patient does not have any tenderness with palpation of the pelvis, upper or lower extremities.  Skin:    General: Skin is warm.  Neurological:     Mental Status: He is alert and oriented to person, place, and time.     ED Results / Procedures / Treatments   Labs (all labs ordered are listed, but only abnormal results are displayed) Labs Reviewed  CBC WITH DIFFERENTIAL/PLATELET - Abnormal; Notable for the following components:      Result Value   RBC 3.13 (*)    Hemoglobin 11.3 (*)    HCT 32.4 (*)    MCV 103.5 (*)    MCH 36.1 (*)    RDW 20.4 (*)    Monocytes Absolute 1.3 (*)    Abs Immature Granulocytes 0.12 (*)    All other components within normal limits  COMPREHENSIVE METABOLIC PANEL - Abnormal; Notable for the following components:   Potassium 3.3 (*)    Glucose, Bld 125 (*)    BUN 34 (*)    Creatinine, Ser 1.28 (*)    AST 43 (*)    GFR, Estimated 53 (*)    All other components within normal limits  PROTIME-INR    EKG None  Radiology CT HEAD WO CONTRAST  Result Date: 02/20/2023 CLINICAL DATA:  Head trauma, moderate-severe EXAM: CT HEAD WITHOUT CONTRAST TECHNIQUE: Contiguous axial images were obtained from the base of  the skull through the vertex without intravenous contrast. RADIATION DOSE REDUCTION: This exam was performed according to the departmental dose-optimization program which includes automated exposure control, adjustment of the mA and/or kV according to patient size and/or use of iterative reconstruction technique. COMPARISON:  CT head February 19, 23. FINDINGS: Brain: Similar suspected small hypodense subdural fluid collections along the frontal convexities. Similar mild mass effect without midline shift.  No hyperdense hemorrhage to suggest acute hemorrhage. No evidence of acute large vascular territory infarct, mass lesion or hydrocephalus. Patchy white matter hypodensities, nonspecific but compatible with chronic microvascular ischemic disease. Cerebral atrophy. Vascular: No hyperdense vessel identified. Skull: No evidence of acute fracture.  High right vertex contusion. Sinuses/Orbits: Clear sinuses.  No acute findings. Other: No mastoid effusions. IMPRESSION: 1. No evidence of acute intracranial abnormality. 2. Similar small hypodense subdural fluid collections along the frontal convexities, probably chronic hygromas or chronic hematomas. Similar mild mass effect without midline shift. Electronically Signed   By: Margaretha Sheffield M.D.   On: 02/20/2023 09:23   DG Ribs Unilateral W/Chest Right  Result Date: 02/20/2023 CLINICAL DATA:  Fall with right anterolateral rib pain EXAM: RIGHT RIBS AND CHEST - 5 VIEW COMPARISON:  Chest radiograph dated 02/16/2022 FINDINGS: Nondisplaced fracture of the right lateral eighth rib and mildly displaced fracture of the right lateral ninth rib. Median sternotomy wires are nondisplaced. There is no evidence of pneumothorax or pleural effusion. No focal consolidations. Similar enlarged postsurgical cardiomediastinal silhouette. IMPRESSION: Nondisplaced fracture of the right lateral eighth rib and mildly displaced fracture of the right lateral ninth rib. No pneumothorax or pleural  effusion. Electronically Signed   By: Darrin Nipper M.D.   On: 02/20/2023 09:13    Procedures Procedures    Medications Ordered in ED Medications  fentaNYL (SUBLIMAZE) injection 50 mcg (has no administration in time range)  lidocaine (LIDODERM) 5 % 1 patch (1 patch Transdermal Patch Applied 02/20/23 1104)  acetaminophen (TYLENOL) tablet 1,000 mg (1,000 mg Oral Given 02/20/23 1027)  tiZANidine (ZANAFLEX) tablet 4 mg (4 mg Oral Given 02/20/23 1027)    ED Course/ Medical Decision Making/ A&P                             Medical Decision Making This patient presents to the ED with chief complaint(s) of fall due to vertigo/balance issues with pertinent past medical history of A-fib on Eliquis.The complaint involves an extensive differential diagnosis and also carries with it a high risk of complications and morbidity.    The differential diagnosis includes : Intracranial bleed, rib fracture, pneumothorax. It appears that patient fell down because of vertigo and balance issues.  He has had off-and-on vertiginous symptoms, and he is back to baseline.  No clinical concerns for orthostatic dizziness or stroke at this time.  The initial plan is to get basic labs, CT scan of the brain, x-ray of the ribs and chest.   Additional history obtained: Additional history obtained from EMS  Records reviewed patient's medications were reviewed including Eliquis.  Patient states that he is unsure if he has taken his Eliquis today or not.  Independent labs interpretation:  The following labs were independently interpreted: Patient's labs are overall reassuring.  CBC shows no profound anemia.  Electrolytes are reassuring.  Independent visualization and interpretation of imaging: - I independently visualized the following imaging with scope of interpretation limited to determining acute life threatening conditions related to emergency care: CT scan of the brain, x-ray of the chest, which revealed no evidence of  brain bleed, no evidence of pneumothorax  Treatment and Reassessment: Patient remained comfortable in the ER.  He did not use his as needed fentanyl.  Daughter is at the bedside now.  Results of the x-ray discussed with the patient.  He still has pain with deep inspiration.  Advised that he takes Tylenol every 6 hours for pain.  He already has some tramadol at home, that can be still uses as needed.  To have a PCP follow-up on Monday.  Zanaflex, lidocaine patch added.  Incentive spirometer use encouraged and the rationale provided.  Patient is comfortable going home.  Daughter lives nearby, supportive.  Discussed with them the need to have conversation with PCP about continuing blood thinner.  Amount and/or Complexity of Data Reviewed Labs: ordered. Radiology: ordered.  Risk OTC drugs. Prescription drug management.    Final Clinical Impression(s) / ED Diagnoses Final diagnoses:  Closed fracture of multiple ribs of right side, initial encounter    Rx / DC Orders ED Discharge Orders          Ordered    tizanidine (ZANAFLEX) 2 MG capsule  3 times daily PRN        02/20/23 1102              Varney Biles, MD 02/20/23 1106

## 2023-02-20 NOTE — ED Notes (Addendum)
Trauma Response Nurse Documentation  [name] [name] is a 89 y.o. male arriving to [facility] ED via EMS  On Eliquis (apixaban) daily. Trauma was activated as a Level 2 based on the following trauma criteria Elderly patients > 65 with head trauma on anti-coagulation (excluding ASA). Trauma team at the bedside on patient arrival.   Patient cleared for CT by Dr. Kathrynn Humble. Pt transported to CT with trauma response nurse present to monitor. RN remained with the patient throughout their absence from the department for clinical observation. GCS 15.  History   Past Medical History:  Diagnosis Date   A-fib ([place])    B12 deficiency    Coronary artery disease    HTN (hypertension), benign 04/25/2014   Iron deficiency anemia    Squamous cell carcinoma of skin 11/18/2021   right ear and mandible, EDC   Squamous cell carcinoma of skin 03/18/2022   right mandible and ear/refer for Mohs/ Dr. Lacinda Axon has referred pt to Dr. Truman Hayward in otolaryngology excised by Dr. Truman Hayward 05/03/22, Radiation with Dr. Baruch Gouty   Thyroid disease    Type 2 diabetes mellitus with peripheral angiopathy ([place]) 11/10/2018     Past Surgical History:  Procedure Laterality Date   CARDIAC SURGERY     CABG 1991   HERNIA REPAIR     LOWER EXTREMITY ANGIOGRAPHY Left 08/30/2021   Procedure: LOWER EXTREMITY ANGIOGRAPHY;  Surgeon: Katha Cabal, MD;  Location: [facility];  Service: Cardiovascular;  Laterality: Left;     Initial Focused Assessment (If applicable, or please see trauma documentation): Patient alert, GCS 15, PERR 3 Bruising to head  CT's Completed:   CT Head   Interventions:  IV, labs XRs CT Head  Plan for disposition:  Discharge home with daughter  Event Summary: Patient to ED after a fall at home. Some bruising to head/chest. Imaging negative for injury except 2 rib fxs. Patient discharged home with daughter with instructions for incentive spirometry.  Bedside handoff with ED RN [name] [name].    [name] [name]  [name] [name]  Trauma Response RN  Please call TRN at [phone] for further assistance.

## 2023-02-20 NOTE — ED Notes (Signed)
Incentive spirometer provider for patient use. Patient denies any questions or concerns at this time. Patient demonstrated appropriate use.

## 2023-02-20 NOTE — ED Notes (Signed)
Discharge instructions reviewed with patient and daughter at bedside. Patient denies any questions or concerns. Patient out to lobby via wheelchair with daughter.

## 2023-02-20 NOTE — ED Triage Notes (Signed)
Pt BIB EMS from home. Patient went to use the bathroom and walking back to the room, he had an episode of vertigo, history of same. He struck the top right of his head on the coffee table. Patient denies LOC, pt has two hematomas on the side of the chest. Patient takes eliquis. Patient has a skin tear on the right inside of his elbow. VSS with EMS. Patient able to ambulate with cane post fall.   145/66 65 HR a-fib 97% RA

## 2023-02-23 DIAGNOSIS — I2781 Cor pulmonale (chronic): Secondary | ICD-10-CM | POA: Diagnosis not present

## 2023-02-23 DIAGNOSIS — Z85858 Personal history of malignant neoplasm of other endocrine glands: Secondary | ICD-10-CM | POA: Diagnosis not present

## 2023-02-23 DIAGNOSIS — I509 Heart failure, unspecified: Secondary | ICD-10-CM | POA: Diagnosis not present

## 2023-02-23 DIAGNOSIS — N183 Chronic kidney disease, stage 3 unspecified: Secondary | ICD-10-CM | POA: Diagnosis not present

## 2023-02-23 DIAGNOSIS — Z9181 History of falling: Secondary | ICD-10-CM | POA: Diagnosis not present

## 2023-02-23 DIAGNOSIS — I482 Chronic atrial fibrillation, unspecified: Secondary | ICD-10-CM | POA: Diagnosis not present

## 2023-02-23 DIAGNOSIS — E1151 Type 2 diabetes mellitus with diabetic peripheral angiopathy without gangrene: Secondary | ICD-10-CM | POA: Diagnosis not present

## 2023-02-23 DIAGNOSIS — Z0000 Encounter for general adult medical examination without abnormal findings: Secondary | ICD-10-CM | POA: Diagnosis not present

## 2023-02-23 DIAGNOSIS — Z1331 Encounter for screening for depression: Secondary | ICD-10-CM | POA: Diagnosis not present

## 2023-02-27 ENCOUNTER — Telehealth: Payer: Self-pay

## 2023-02-27 NOTE — Telephone Encounter (Signed)
     Patient  visit on 2/23 [name] [name]     Have you been able to follow up with your primary care physician? Yes   The patient was or was not able to obtain any needed medicine or equipment. Yes   Are there diet recommendations that you are having difficulty following? Na   Patient expresses understanding of discharge instructions and education provided has no other needs at this time.  Yes      [facility] [phone], [place], [place] [address] Phone: [phone] Email: [email or web address]

## 2023-03-09 ENCOUNTER — Ambulatory Visit
Admission: RE | Admit: 2023-03-09 | Discharge: 2023-03-09 | Disposition: A | Discharge status: Home / Self Care | Payer: Medicare HMO | Source: Ambulatory Visit | Attending: Radiation Oncology | Admitting: Radiation Oncology

## 2023-03-09 DIAGNOSIS — C07 Malignant neoplasm of parotid gland: Secondary | ICD-10-CM | POA: Diagnosis not present

## 2023-03-09 DIAGNOSIS — C760 Malignant neoplasm of head, face and neck: Secondary | ICD-10-CM | POA: Diagnosis not present

## 2023-03-09 MED ORDER — IOHEXOL 300 MG/ML  SOLN
75.0000 mL | Freq: Once | INTRAMUSCULAR | Status: AC | PRN
  Administered 2023-03-09: 75 mL via INTRAVENOUS

## 2023-03-10 DIAGNOSIS — S40211A Abrasion of right shoulder, initial encounter: Secondary | ICD-10-CM | POA: Diagnosis not present

## 2023-03-10 DIAGNOSIS — I4892 Unspecified atrial flutter: Secondary | ICD-10-CM | POA: Diagnosis not present

## 2023-03-10 DIAGNOSIS — I4891 Unspecified atrial fibrillation: Secondary | ICD-10-CM | POA: Diagnosis not present

## 2023-03-16 ENCOUNTER — Encounter: Payer: Self-pay | Admitting: Radiation Oncology

## 2023-03-16 ENCOUNTER — Ambulatory Visit
Admission: RE | Admit: 2023-03-16 | Discharge: 2023-03-16 | Disposition: A | Discharge status: Home / Self Care | Payer: Medicare HMO | Source: Ambulatory Visit | Attending: Radiation Oncology | Admitting: Radiation Oncology

## 2023-03-16 VITALS — BP 87/50 | HR 61 | Temp 97.0°F | Resp 16

## 2023-03-16 DIAGNOSIS — C4442 Squamous cell carcinoma of skin of scalp and neck: Secondary | ICD-10-CM | POA: Diagnosis not present

## 2023-03-16 DIAGNOSIS — C770 Secondary and unspecified malignant neoplasm of lymph nodes of head, face and neck: Secondary | ICD-10-CM | POA: Insufficient documentation

## 2023-03-16 DIAGNOSIS — C07 Malignant neoplasm of parotid gland: Secondary | ICD-10-CM

## 2023-03-16 DIAGNOSIS — Z923 Personal history of irradiation: Secondary | ICD-10-CM | POA: Insufficient documentation

## 2023-03-16 NOTE — Progress Notes (Signed)
Radiation Oncology Follow up Note  Name: [name] [name]   Date:   03/16/2023 MRN:  [id number] DOB: May 05, 1929    This 89 y.o. male presents to the clinic today for 72-month follow-up status post external beam radiation therapy to his right neck status post resection at [facility] for squamous cell carcinoma with 1 of 3 nodes positive.  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  Seen today in routine follow-up doing fairly well from a head and neck standpoint specifically denies head and neck pain dysphagia or any new nodularity in his head and neck region..  He had a recent CT scan this month showing persistent soft tissue at the superior margin of superficial thyroidectomy indeterminate and potentially postsurgical.  No evidence of metastatic disease elsewhere in the head and neck region.  Patient has not followed up with his surgeons from [facility].  COMPLICATIONS OF TREATMENT: none  FOLLOW UP COMPLIANCE: keeps appointments   PHYSICAL EXAM:  BP (!) 87/50 (BP Location: Left Arm, Patient Position: Sitting, Cuff Size: Normal) Comment: Patient states BP is always low but states he needs to drink more water.  Pulse 61   Temp (!) 97 F (36.1 C) (Tympanic)   Resp 16  Head and neck region shows no evidence of recurrent or residual disease.  No cervical or supraclavicular adenopathy is identified.  Frail-appearing elderly male in NAD.  Well-developed well-nourished patient in NAD. HEENT reveals PERLA, EOMI, discs not visualized.  Oral cavity is clear. No oral mucosal lesions are identified. Neck is clear without evidence of cervical or supraclavicular adenopathy. Lungs are clear to A&P. Cardiac examination is essentially unremarkable with regular rate and rhythm without murmur rub or thrill. Abdomen is benign with no  organomegaly or masses noted. Motor sensory and DTR levels are equal and symmetric in the upper and lower extremities. Cranial nerves II through XII are grossly intact. Proprioception is intact. No peripheral adenopathy or edema is identified. No motor or sensory levels are noted. Crude visual fields are within normal range.  RADIOLOGY RESULTS: T scans reviewed compatible with above-stated findings  PLAN: Present time patient is 6 months out from adjuvant radiation therapy to his head and neck region.  He has no evidence of disease.  I have asked to see him back in 1 year for follow-up.  Of asked patient and his family to contact Dr. Truman Hayward at [facility].  He was his Psychologist, sport and exercise and I have suggested they probably would like to follow him.  Patient and family know to call me with any questions.  I would like to take this opportunity to thank you for allowing me to participate in the care of your patient.Noreene Filbert, MD

## 2023-04-01 DIAGNOSIS — H02132 Senile ectropion of right lower eyelid: Secondary | ICD-10-CM | POA: Diagnosis not present

## 2023-04-01 DIAGNOSIS — Z0100 Encounter for examination of eyes and vision without abnormal findings: Secondary | ICD-10-CM | POA: Diagnosis not present

## 2023-04-27 ENCOUNTER — Emergency Department: Payer: Medicare HMO

## 2023-04-27 ENCOUNTER — Emergency Department
Admission: EM | Admit: 2023-04-27 | Discharge: 2023-04-27 | Disposition: A | Discharge status: Home / Self Care | Payer: Medicare HMO | Attending: Emergency Medicine | Admitting: Emergency Medicine

## 2023-04-27 ENCOUNTER — Other Ambulatory Visit: Payer: Self-pay

## 2023-04-27 DIAGNOSIS — I700 Atherosclerosis of aorta: Secondary | ICD-10-CM | POA: Diagnosis not present

## 2023-04-27 DIAGNOSIS — I10 Essential (primary) hypertension: Secondary | ICD-10-CM | POA: Insufficient documentation

## 2023-04-27 DIAGNOSIS — E119 Type 2 diabetes mellitus without complications: Secondary | ICD-10-CM | POA: Diagnosis not present

## 2023-04-27 DIAGNOSIS — R109 Unspecified abdominal pain: Secondary | ICD-10-CM | POA: Diagnosis not present

## 2023-04-27 DIAGNOSIS — K4090 Unilateral inguinal hernia, without obstruction or gangrene, not specified as recurrent: Secondary | ICD-10-CM | POA: Insufficient documentation

## 2023-04-27 DIAGNOSIS — R1032 Left lower quadrant pain: Secondary | ICD-10-CM | POA: Diagnosis present

## 2023-04-27 LAB — COMPREHENSIVE METABOLIC PANEL
ALT: 16 U/L (ref 0–44)
AST: 26 U/L (ref 15–41)
Albumin: 3.8 g/dL (ref 3.5–5.0)
Alkaline Phosphatase: 60 U/L (ref 38–126)
Anion gap: 7 (ref 5–15)
BUN: 28 mg/dL — ABNORMAL HIGH (ref 8–23)
CO2: 29 mmol/L (ref 22–32)
Calcium: 8.6 mg/dL — ABNORMAL LOW (ref 8.9–10.3)
Chloride: 100 mmol/L (ref 98–111)
Creatinine, Ser: 1.1 mg/dL (ref 0.61–1.24)
GFR, Estimated: >60 mL/min (ref >60)
Glucose, Bld: 114 mg/dL — ABNORMAL HIGH (ref 70–99)
Potassium: 4 mmol/L (ref 3.5–5.1)
Sodium: 136 mmol/L (ref 135–145)
Total Bilirubin: 0.8 mg/dL (ref 0.3–1.2)
Total Protein: 6.9 g/dL (ref 6.5–8.1)

## 2023-04-27 LAB — CBC
HCT: 29.5 % — ABNORMAL LOW (ref 39.0–52.0)
Hemoglobin: 10.2 g/dL — ABNORMAL LOW (ref 13.0–17.0)
MCH: 36.2 pg — ABNORMAL HIGH (ref 26.0–34.0)
MCHC: 34.6 g/dL (ref 30.0–36.0)
MCV: 104.6 fL — ABNORMAL HIGH (ref 80.0–100.0)
Platelets: 171 10*3/uL (ref 150–400)
RBC: 2.82 MIL/uL — ABNORMAL LOW (ref 4.22–5.81)
RDW: 18.4 % — ABNORMAL HIGH (ref 11.5–15.5)
WBC: 7.8 10*3/uL (ref 4.0–10.5)
nRBC: 0 % (ref 0.0–0.2)

## 2023-04-27 MED ORDER — IOHEXOL 300 MG/ML  SOLN
100.0000 mL | Freq: Once | INTRAMUSCULAR | Status: AC | PRN
  Administered 2023-04-27: 100 mL via INTRAVENOUS

## 2023-04-27 NOTE — ED Notes (Signed)
EDP Paduchowski at bedside.  

## 2023-04-27 NOTE — ED Triage Notes (Signed)
Pt comes with c/o left lower hernia in groin area. Pt states just noticed it today at 12. Pt states no urinary symptoms.

## 2023-04-27 NOTE — ED Provider Notes (Signed)
[facility] Provider Note    Event Date/Time   First MD Initiated Contact with Patient 04/27/23 1548     (approximate)  History   Chief Complaint: Hernia  HPI  [name] [name] is a 89 y.o. male with a past medical history of atrial fibrillation, hypertension, diabetes, presents to the emergency department for left lower quadrant abdominal pain and swelling.  According to the patient for quite some time he has had swelling to the left groin consistent with a hernia.  He states today however around 12 PM he developed significant pain to this area and thought it was more swollen than typical.  Patient states since that time he has continued to have discomfort although states the swelling has come down somewhat.  Patient states his last bowel movement was the day before yesterday which is somewhat atypical.  Denies any nausea or vomiting.  Physical Exam   Triage Vital Signs: ED Triage Vitals [04/27/23 1458]  Enc Vitals Group     BP 105/69     Pulse Rate 69     Resp 18     Temp 98 F (36.7 C)     Temp src      SpO2 95 %     Weight      Height      Head Circumference      Peak Flow      Pain Score 10     Pain Loc      Pain Edu?      Excl. in GC?     Most recent vital signs: Vitals:   04/27/23 1458  BP: 105/69  Pulse: 69  Resp: 18  Temp: 98 F (36.7 C)  SpO2: 95%    General: Awake, no distress.  CV:  Good peripheral perfusion.  Regular rate and rhythm  Resp:  Normal effort.  Equal breath sounds bilaterally.  Abd:  No distention.  Soft.  Patient does have swelling of the left inguinal canal extending to but not reaching the scrotum.  Attempted bedside reduction with some success.  Abdomen otherwise benign.  Did state mild to moderate pain with reduction attempt.   ED Results / Procedures / Treatments   RADIOLOGY  I have reviewed and interpreted CT images.  Patient appears to have a left inguinal hernia but no obvious bowel obstruction seen  on my evaluation. Radiology has read the CT scan is stable moderate to large left inguinal hernia containing a segment of the colon but no evidence of incarceration.    MEDICATIONS ORDERED IN ED: Medications - No data to display   IMPRESSION / MDM / ASSESSMENT AND PLAN / ED COURSE  I reviewed the triage vital signs and the nursing notes.  Patient's presentation is most consistent with acute presentation with potential threat to life or bodily function.  Patient presents emergency department for left lower quad abdominal pain and swelling.  Exam most consistent left inguinal hernia.  Given the patient's complaint of pain as well as constipation we will obtain CT imaging the abdomen and pelvis to further evaluate and rule out incarceration strangulation or obstruction.  Will check a CBC and chemistry as a precaution in anticipation for possible contrast for the CT.  Will continue to closely monitor while awaiting results.  Patient agreeable to plan of care.  CBC has resulted normal with a normal white blood cell count.  Chemistry shows no significant findings.  CT scan has resulted showing a left inguinal hernia but  reassuringly no incarceration, no obstruction.  Patient will be discharged with general surgery follow-up to discuss hernia and treatment options.  FINAL CLINICAL IMPRESSION(S) / ED DIAGNOSES   Left inguinal hernia   Note:  This document was prepared using Dragon voice recognition software and may include unintentional dictation errors.   Minna Antis, MD 04/27/23 [phone]

## 2023-04-27 NOTE — ED Notes (Signed)
Visitor remains at bedside.  

## 2023-04-27 NOTE — Discharge Instructions (Addendum)
Please follow-up with general surgery by calling the number provided to discuss your hernia treatment options.  Return to the emergency department for any return of/worsening pain to this area, if you begin vomiting or any other symptom personally concerning to yourself.

## 2023-04-27 NOTE — ED Notes (Signed)
Pt reports intermittent abdominal pain; tender at hernia site; reports long hx of hernia; reports seems more swollen to him lately; denies fever, N/V/D. Pt's skin dry, resp reg/unlabored, and laying calmly on stretcher currently. Pt denies pain currently. States when he does have pain it is 10/10.

## 2023-04-30 ENCOUNTER — Ambulatory Visit: Payer: Medicare HMO | Admitting: Surgery

## 2023-05-01 DIAGNOSIS — I509 Heart failure, unspecified: Secondary | ICD-10-CM | POA: Diagnosis not present

## 2023-05-01 DIAGNOSIS — K4090 Unilateral inguinal hernia, without obstruction or gangrene, not specified as recurrent: Secondary | ICD-10-CM | POA: Diagnosis not present

## 2023-05-01 DIAGNOSIS — I2510 Atherosclerotic heart disease of native coronary artery without angina pectoris: Secondary | ICD-10-CM | POA: Diagnosis not present

## 2023-05-05 ENCOUNTER — Telehealth: Payer: Self-pay

## 2023-05-05 NOTE — Telephone Encounter (Signed)
Transition Care Management Follow-up Telephone Call Date of discharge and from where: 04/27/2023 [facility] How have you been since you were released from the hospital? Patient is feeling better Any questions or concerns? No  Items Reviewed: Did the pt receive and understand the discharge instructions provided? Yes  Medications obtained and verified? Yes  Other? No  Any new allergies since your discharge? No  Dietary orders reviewed? Yes Do you have support at home? Yes   Follow up appointments reviewed:  PCP Hospital f/u appt confirmed? Yes  Scheduled to see Doren Custard MD on 05/01/2023 @ [facility]. Specialist Hospital f/u appt confirmed? No  Scheduled to see  on  @ . Are transportation arrangements needed? No  If their condition worsens, is the pt aware to call PCP or go to the Emergency Dept.? Yes Was the patient provided with contact information for the PCP's office or ED? Yes Was to pt encouraged to call back with questions or concerns? Yes  Maui Britten [facility]  [facility] Population Health Community Resource Care Guide   [email or web address]  ?? [phone]   Website: [email or web address]  [email or web address]

## 2023-05-15 ENCOUNTER — Ambulatory Visit: Payer: Self-pay | Admitting: General Surgery

## 2023-05-15 DIAGNOSIS — K4091 Unilateral inguinal hernia, without obstruction or gangrene, recurrent: Secondary | ICD-10-CM | POA: Diagnosis not present

## 2023-05-15 NOTE — H&P (Signed)
PATIENT PROFILE: [name] [name] is a 89 y.o. male who presents to the Clinic for consultation at the request of Dr. Hyacinth Meeker for evaluation of left inguinal hernia.  PCP:  Carlynn Purl, MD  HISTORY OF PRESENT ILLNESS: [name] [name] reports patient with episode of severe pain in the left groin.  The pain was so severe and that he went to the ED on 04/27/2023.  At the ED he was found with an incarcerated left inguinal hernia but was able to be reduced.  Immediately the pain improved and the patient was able to be discharged home.  Since then he has not having any significant pain.  Pain at that time was localized in the left groin.  No pain radiation.  Pain aggravated by pressure.  Alleviating factor was reducing the hernia.  He denies any episode of abdominal distention, nausea or vomiting.  He has had a bilateral open inguinal hernia repair in the past.  He cannot recall how long ago it was but it was many years ago.   PROBLEM LIST: Problem List  Date Reviewed: 10/19/2022          Noted   Parotid neoplasm 05/03/2022   Overview    Resection 5/23, [facility]      Personal history of other malignant neoplasm of skin 04/07/2022   PAD (peripheral artery disease) (CMS-HCC) 09/04/2021   Overview    Left leg PTCA 9/22      Acute on chronic systolic CHF (congestive heart failure), NYHA class 4 (CMS/HHS-HCC) 09/04/2021   Overview    Biventricular dysfunction, severe TR, EF 25%, echo 8/22      Cor pulmonale, acute (CMS/HHS-HCC) 05/02/2020   Overview    Cardiomyopathy, severe MR, severe TR, dilated IVC, 3/21      Atrial fibrillation and flutter (CMS/HHS-HCC) 02/08/2019   Type 2 diabetes mellitus with peripheral angiopathy (CMS/HHS-HCC) 11/10/2018   Medicare annual wellness visit, initial 05/05/2017   Overview    5/18, 5/19, 5/20, 5/21, 5/22, 1/23, 2/24      Venous insufficiency of left leg 05/05/2017   Hyperlipidemia, mixed 05/05/2017   Acquired hypothyroidism 11/03/2016   B12 deficiency 10/27/2014    Cataract cortical, senile Unknown   CAD (coronary artery disease) 04/25/2014   Overview    Post lateral MI 1999, EF 45%      HTN (hypertension), benign 04/25/2014   GERD (gastroesophageal reflux disease) 04/25/2014   Cardiomyopathy, dilated (CMS-HCC) 04/08/2012   Overview    SEVERE DILATED CARDIOMYOPATHY WITH LVF OF 20-25% BY ECHO      S/P CABG x 3 02/26/1993    GENERAL REVIEW OF SYSTEMS:   General ROS: negative for - chills, fatigue, fever, weight gain or weight loss Allergy and Immunology ROS: negative for - hives  Hematological and Lymphatic ROS: negative for - bleeding problems or bruising, negative for palpable nodes Endocrine ROS: negative for - heat or cold intolerance, hair changes Respiratory ROS: negative for - cough, shortness of breath or wheezing Cardiovascular ROS: no chest pain or palpitations GI ROS: negative for nausea, vomiting, abdominal pain, diarrhea, constipation Musculoskeletal ROS: negative for - joint swelling or muscle pain Neurological ROS: negative for - confusion, syncope Dermatological ROS: negative for pruritus and rash Psychiatric: negative for anxiety, depression, difficulty sleeping and memory loss  MEDICATIONS: Current Outpatient Medications  Medication Sig Dispense Refill   acetaminophen (TYLENOL) 325 MG tablet Take 650 mg by mouth every 4 (four) hours as needed for Pain     apixaban (ELIQUIS) 2.5 mg tablet  Take 1 tablet (2.5 mg total) by mouth once daily     aspirin 81 MG EC tablet Take 81 mg by mouth once daily     Herbal Supplement Herbal Name: Pro biotic.     levothyroxine (SYNTHROID) 112 MCG tablet TAKE 1 TABLET ONE TIME DAILY ON AN EMPTY STOMACH WITH A GLASS OF WATER AT LEAST 30 TO 60 MINUTES BEFORE BREAKFAST 90 tablet 3   magnesium hydroxide (MILK OF MAGNESIA) 400 mg/5 mL suspension Take 5 mLs by mouth     metoprolol succinate (TOPROL-XL) 50 MG XL tablet Take 0.5 tablets (25 mg total) by mouth once daily     multivitamin with minerals  tablet Take 1 tablet by mouth once daily.       omeprazole (PRILOSEC) 20 MG DR capsule TAKE 1 CAPSULE EVERY DAY 90 capsule 3   potassium chloride (KLOR-CON) 10 MEQ ER tablet Take 3 tablets (30 mEq total) by mouth once daily 270 tablet 3   simvastatin (ZOCOR) 40 MG tablet Take 1 tablet (40 mg total) by mouth at bedtime 90 tablet 3   tiZANidine (ZANAFLEX) 2 MG tablet      TORsemide (DEMADEX) 20 MG tablet Take 2 tablets (40 mg total) by mouth once daily (Patient taking differently: Take 40 mg by mouth once daily Per daughter, now taking.1.5 tablet.) 180 tablet 3   triamcinolone 0.5 % cream Apply topically as needed     VITAMIN B-12 1,000 mcg SL tablet Take 1,000 mcg by mouth once daily.      No current facility-administered medications for this visit.    ALLERGIES: Keflex [cephalexin], Spironolactone, Codeine, and Doxycycline  PAST MEDICAL HISTORY: Past Medical History:  Diagnosis Date   Atrial fibrillation (CMS/HHS-HCC) 04/25/2014   B12 deficiency 10/27/2014   CAD (coronary artery disease) 04/25/2014   Post lateral MI 1999, EF 45%   Cardiomyopathy, secondary (CMS/HHS-HCC)    Cataract cortical, senile    GERD (gastroesophageal reflux disease)    History of chicken pox    History of [place] spotted fever    History of shingles    HTN (hypertension), benign 04/25/2014   Hyperglycemia    Hyperlipemia 04/25/2014   Hypothyroid 04/25/2014   Myocardial infarction (CMS/HHS-HCC)     PAST SURGICAL HISTORY: Past Surgical History:  Procedure Laterality Date   CORONARY ARTERY BYPASS GRAFT  02/26/1993   x3   KNEE ARTHROSCOPY Right 10/03/2008   Partial medial and lateral meniscectomies   RIGHT ELBOW BURSECTOMY Right 02/06/2015   EXCISION PAROTID GLAND/TUMOR W/FACIAL NERVE DISSECTION Right 05/03/2022   Procedure: EXCISION PAROTID GLAND/TUMOR W/FACIAL NERVE DISSECTION;  Surgeon: Diona Fanti, MD;  Location: [facility] OR;  Service: Otolaryngology Head and Neck;  Laterality: Right;    EXCISION MALIGNANT LESION FACE/EAR/EYELID/NOSE/LIP Right 05/03/2022   Procedure: EXCISION, MALIGNANT LESION INCLUDING MARGINS, FACE/EAR/EYELID/NOSE/LIP; EXCISED DIAMETER OVER 4.0 CM;  Surgeon: Diona Fanti, MD;  Location: [facility] OR;  Service: Otolaryngology Head and Neck;  Laterality: Right;   CERVICAL LYMPHADENECTOMY Right 05/03/2022   Procedure: CERVICAL LYMPHADENECTOMY (MODIFIED RADICAL NECK DISSECTION);  Surgeon: Diona Fanti, MD;  Location: [facility] OR;  Service: Otolaryngology Head and Neck;  Laterality: Right;   bilateral inguinal hernia     CARDIAC SURGERY     HERNIA REPAIR Bilateral      FAMILY HISTORY: Family History  Problem Relation Name Age of Onset   Stroke Mother     No Known Problems Father     Myocardial Infarction (Heart attack) Other  Sibling   Anesthesia problems Neg Hx       SOCIAL HISTORY: Social History   Socioeconomic History   Marital status: Widowed  Tobacco Use   Smoking status: Never   Smokeless tobacco: Never  Vaping Use   Vaping status: Never Used  Substance and Sexual Activity   Alcohol use: No    Alcohol/week: 0.0 standard drinks of alcohol   Drug use: No   Sexual activity: Defer  Social History Narrative   Lives alone. Two daughters in town.   Social Determinants of Health   Transportation Needs: No Transportation Needs (05/05/2022)   PRAPARE - Administrator, Civil Service (Medical): No    Lack of Transportation (Non-Medical): No    PHYSICAL EXAM: Vitals:   05/15/23 0932  BP: 110/56  Pulse: 75   Body mass index is 23.26 kg/m. Weight: 69.4 kg (153 lb)   GENERAL: Alert, active, oriented x3  HEENT: Pupils equal reactive to light. Extraocular movements are intact. Sclera clear. Palpebral conjunctiva normal red color.Pharynx clear.  NECK: Supple with no palpable mass and no adenopathy.  LUNGS: Sound clear with no rales rhonchi or wheezes.  HEART: Regular rhythm S1 and S2 without  murmur.  ABDOMEN: Soft and depressible, nontender with no palpable mass, no hepatomegaly.  Left inguinal hernia, reducible.  EXTREMITIES: Well-developed well-nourished symmetrical with no dependent edema.  NEUROLOGICAL: Awake alert oriented, facial expression symmetrical, moving all extremities.  REVIEW OF DATA: I have reviewed the following data today: Office Visit on 02/23/2023  Component Date Value   Hemoglobin 02/23/2023 10.2 (L)    Glucose 02/23/2023 105    Sodium 02/23/2023 138    Potassium 02/23/2023 4.0    Chloride 02/23/2023 100    Carbon Dioxide (CO2) 02/23/2023 31.7    Calcium 02/23/2023 9.1    Urea Nitrogen (BUN) 02/23/2023 36 (H)    Creatinine 02/23/2023 1.4 (H)    Glomerular Filtration Ra* 02/23/2023 47 (L)    BUN/Crea Ratio 02/23/2023 25.7 (H)    Anion Gap w/K 02/23/2023 10.3    Hemoglobin A1C 02/23/2023 6.4 (H)    Average Blood Glucose (C* 02/23/2023 137    Vitamin B12 02/23/2023 >1,500    Color 02/23/2023 Light Yellow    Clarity 02/23/2023 Clear    Specific Gravity 02/23/2023 1.012    pH, Urine 02/23/2023 5.0    Protein, Urinalysis 02/23/2023 Negative    Glucose, Urinalysis 02/23/2023 Negative    Ketones, Urinalysis 02/23/2023 Negative    Blood, Urinalysis 02/23/2023 Negative    Nitrite, Urinalysis 02/23/2023 Negative    Leukocyte Esterase, Urin* 02/23/2023 Negative    Bilirubin, Urinalysis 02/23/2023 Negative    Urobilinogen, Urinalysis 02/23/2023 0.2    Mucous, Urine 02/23/2023 PRESENT (!)    WBC, UA 02/23/2023 <1    Red Blood Cells, Urinaly* 02/23/2023 0    Bacteria, Urinalysis 02/23/2023 0-5    Squamous Epithelial Cell* 02/23/2023 0    Thyroid Stimulating Horm* 02/23/2023 3.450      ASSESSMENT: Mr. Parlette is a 89 y.o. male presenting for consultation for left versus bilateral inguinal hernia.    The patient presents with a symptomatic, reducible left versus bilateral inguinal hernia. Patient was oriented about the diagnosis of inguinal hernia  and its implication. The patient was oriented about the treatment alternatives (observation vs surgical repair). Due to patient symptoms, repair is recommended. Patient oriented about the surgical procedure, the use of mesh and its risk of complications such as: infection, bleeding, injury to vas deference, vasculature and testicle,  injury to bowel or bladder, and chronic pain.   Despite patient age and morbidities due to the recent episode of incarceration that took him to the ER I think that it would be better to try to repair these and on elective basis if cleared by cardiology then having another episode and needing emergent surgery for possible incarceration/strangulation under anticoagulation.  I discussed with patient and his daughter about the higher risks of surgery and anesthesia but they agree that it would be better setting on their elective basis.  Will get cardiac clearance.  Unilateral recurrent inguinal hernia without obstruction or gangrene [K40.91]  PLAN: 1.  Robotic assisted laparoscopic left vs bilateral inguinal hernia repair with mesh (16109) 2.  Hold blood thinner for at least 2 days before surgery 3.  Cardiology Clearance 4.  Contact us if has any question or concern.   Patient and his daughter verbalized understanding, all questions were answered, and were agreeable with the plan outlined above.    Carolan Shiver, MD  Electronically signed by Carolan Shiver, MD

## 2023-05-22 ENCOUNTER — Encounter: Payer: Self-pay | Admitting: Urgent Care

## 2023-05-27 ENCOUNTER — Encounter
Admission: RE | Admit: 2023-05-27 | Discharge: 2023-05-27 | Disposition: A | Discharge status: Home / Self Care | Payer: Medicare HMO | Source: Ambulatory Visit | Attending: General Surgery | Admitting: General Surgery

## 2023-05-27 ENCOUNTER — Telehealth: Payer: Self-pay | Admitting: *Deleted

## 2023-05-27 ENCOUNTER — Other Ambulatory Visit: Payer: Self-pay

## 2023-05-27 ENCOUNTER — Encounter: Payer: Self-pay | Admitting: General Surgery

## 2023-05-27 DIAGNOSIS — Z01812 Encounter for preprocedural laboratory examination: Secondary | ICD-10-CM

## 2023-05-27 DIAGNOSIS — E1151 Type 2 diabetes mellitus with diabetic peripheral angiopathy without gangrene: Secondary | ICD-10-CM

## 2023-05-27 HISTORY — DX: Family history of other specified conditions: Z84.89

## 2023-05-27 HISTORY — DX: Hypothyroidism, unspecified: E03.9

## 2023-05-27 HISTORY — DX: Heart failure, unspecified: I50.9

## 2023-05-27 HISTORY — DX: Gastro-esophageal reflux disease without esophagitis: K21.9

## 2023-05-27 NOTE — Progress Notes (Signed)
  Perioperative Services Pre-Admission/Anesthesia Testing    Date: 05/27/23  Name: [name] [name] MRN:   [id number]  Re: Need to consider transferring care   Planned Surgical Procedure(s):    Case: [id number] Date/Time: 06/03/23 0815   Procedure: XI ROBOTIC ASSISTED BILATERAL INGUINAL HERNIA (Bilateral: Inguinal)   Anesthesia type: General   Pre-op diagnosis: unilateral recurrent inguinal hernia w/o obstruction or gangrene K40.91   Location: [facility] OR ROOM 07 / [facility]   Surgeons: Carolan Shiver, MD   Clinical Notes:  Patient is scheduled for the above procedure on 06/03/2023 with Dr. Carolan Shiver, MD. Chart was referred to me for preoperative review in the setting of patient with known cardiovascular disease. Patient has a history of CAD (s/p CABG), atrial fibrillation, ischemic cardiomyopathy, CHF, and pulmonary hypertension. Patient is not followed by cardiology service at this time.   In review of his diagnotic studies, last TTE was performed on 08/21/2021 revealing a severely reduced left ventricular systolic function with an EF of 30%.  There was severe global hypokinesis with associated apical dyskinesis. Left ventricle was mildly enlarged there was moderate biatrial enlargement. Trivial aortic and pulmonic valve, moderate mitral valve, and severe tricuspid valve regurgitation noted. All transvalvular gradients were noted to be normal providing no evidence suggestive of valvular stenosis. Pulmonary pressures "significantly elevated", however I do not see a specific RVSP/PASP/TR velocity listed on the study report. Last RVSP in 2021 was documented at 73.3 mmHg. Significantly elevated RVSP consistent with severe pulmonary hypertension.   Impression and Plan:  [name] [name] with complex cardiovascular diagnoses. He has a severely reduced EF in the setting of an ischemic cardiomyopathy and resulting CHF. Patient has atrial fibrillation. Of most concern  is his cor pulmonale diagnosis, which is secondary to pulmonary hypertension. PA pressures indicative of severe disease. I have concerns that this patient will required a higher level of care for the anticipated elective surgical case (hernia repair). Typically, patients with severe pulmonary hypertension require intraoperative treatment with iNO (inhaled nitrous oxide), which unfortunately is not something that we have available here at [facility].   Case was discussed with on call anesthesia attending Lorette Ang, MD), who ultimately agreed with my assessment and concerns. Anesthesia service recommending transfer of patient's care to a tertiary care center that is better equipped at providing the complex cardiovascular/cardiopulmonary care services. Provision for cardiac anesthesia need to be in place in efforts to promote and ensure a safe and effective anesthetic course for this patient's elective procedure. Copy of note being sent to primary attending surgeon. PAT staff contacted surgery scheduler to make them aware of need for care transfer. This will need to be discussed with the patient and his daughter by general surgery office.    Quentin Mulling, MSN, APRN, FNP-C, CEN [facility]  Peri-operative Services Nurse Practitioner Phone: [phone] 05/27/23 4:36 PM  NOTE: This note has been prepared using Dragon dictation software. Despite my best ability to proofread, there is always the potential that unintentional transcriptional errors may still occur from this process.

## 2023-05-27 NOTE — Patient Instructions (Addendum)
Your procedure is scheduled on: 06/03/23 - Wednesday Report to the Registration Desk on the 1st floor of the [facility]. To find out your arrival time, please call [phone] between 1PM - 3PM on: 06/02/23 - Tuesday If your arrival time is 6:00 am, do not arrive before that time as the Medical [facility] entrance doors do not open until 6:00 am.  REMEMBER: Instructions that are not followed completely may result in serious medical risk, up to and including death; or upon the discretion of your surgeon and anesthesiologist your surgery may need to be rescheduled.  Do not eat food or drink any liquids after midnight the night before surgery.  No gum chewing or hard candies.  One week prior to surgery: Stop Anti-inflammatories (NSAIDS) such as Advil, Aleve, Ibuprofen, Motrin, Naproxen, Naprosyn and Aspirin based products such as Excedrin, Goody's Powder, BC Powder.  Stop ANY OVER THE COUNTER supplements until after surgery.  You may however, continue to take Tylenol if needed for pain up until the day of surgery.  Continue taking all prescribed medications with the exception of the following:  apixaban (ELIQUIS) hold beginning 06/01/23.   TAKE ONLY THESE MEDICATIONS THE MORNING OF SURGERY WITH A SIP OF WATER:  omeprazole (PRILOSEC) - (take one the night before and one on the morning of surgery - helps to prevent nausea after surgery.) levothyroxine (SYNTHROID, LEVOTHROID)  metoprolol succinate (TOPROL-XL)    No Alcohol for 24 hours before or after surgery.  No Smoking including e-cigarettes for 24 hours before surgery.  No chewable tobacco products for at least 6 hours before surgery.  No nicotine patches on the day of surgery.  Do not use any "recreational" drugs for at least a week (preferably 2 weeks) before your surgery.  Please be advised that the combination of cocaine and anesthesia may have negative outcomes, up to and including death. If you test positive for cocaine, your  surgery will be cancelled.  On the morning of surgery brush your teeth with toothpaste and water, you may rinse your mouth with mouthwash if you wish. Do not swallow any toothpaste or mouthwash.  Do not wear jewelry, make-up, hairpins, clips or nail polish.  Do not wear lotions, powders, or perfumes.   Do not shave body hair from the neck down 48 hours before surgery.  Contact lenses, hearing aids and dentures may not be worn into surgery.  Do not bring valuables to the hospital. [facility] is not responsible for any missing/lost belongings or valuables.   Notify your doctor if there is any change in your medical condition (cold, fever, infection).  Wear comfortable clothing (specific to your surgery type) to the hospital.  After surgery, you can help prevent lung complications by doing breathing exercises.  Take deep breaths and cough every 1-2 hours. Your doctor may order a device called an Incentive Spirometer to help you take deep breaths. When coughing or sneezing, hold a pillow firmly against your incision with both hands. This is called "splinting." Doing this helps protect your incision. It also decreases belly discomfort.  If you are being admitted to the hospital overnight, leave your suitcase in the car. After surgery it may be brought to your room.  In case of increased patient census, it may be necessary for you, the patient, to continue your postoperative care in the Same Day Surgery department.  If you are being discharged the day of surgery, you will not be allowed to drive home. You will need a responsible individual  to drive you home and stay with you for 24 hours after surgery.   If you are taking public transportation, you will need to have a responsible individual with you.  Please call the Pre-admissions Testing Dept. at [phone] if you have any questions about these instructions.  Surgery Visitation Policy:  Patients having surgery or a procedure may  have two visitors.  Children under the age of 67 must have an adult with them who is not the patient.  Inpatient Visitation:    Visiting hours are 7 a.m. to 8 p.m. Up to four visitors are allowed at one time in a patient room. The visitors may rotate out with other people during the day.  One visitor age 28 or older may stay with the patient overnight and must be in the room by 8 p.m.

## 2023-05-27 NOTE — Telephone Encounter (Signed)
Patient daughter called reporting that patient has developed "knots" under the area that he had his cancer removed and is askiing what to do about it. Please advise. He does not see Medical Oncology

## 2023-05-27 NOTE — Pre-Procedure Instructions (Signed)
Mitchell,Wanda (Daughter) HCPOA completed PAT call.

## 2023-05-28 ENCOUNTER — Encounter: Payer: Self-pay | Admitting: Urgent Care

## 2023-05-28 NOTE — Telephone Encounter (Signed)
Called patient's daughter regarding changes to his skin. He is seeing his PCP Monday and will follow PCP advice regarding referral.

## 2023-06-01 DIAGNOSIS — I2781 Cor pulmonale (chronic): Secondary | ICD-10-CM | POA: Diagnosis not present

## 2023-06-01 DIAGNOSIS — I2510 Atherosclerotic heart disease of native coronary artery without angina pectoris: Secondary | ICD-10-CM | POA: Diagnosis not present

## 2023-06-01 DIAGNOSIS — M278 Other specified diseases of jaws: Secondary | ICD-10-CM | POA: Diagnosis not present

## 2023-06-01 DIAGNOSIS — I509 Heart failure, unspecified: Secondary | ICD-10-CM | POA: Diagnosis not present

## 2023-06-03 ENCOUNTER — Ambulatory Visit: Admit: 2023-06-03 | Payer: Medicare HMO | Admitting: General Surgery

## 2023-06-03 ENCOUNTER — Other Ambulatory Visit: Payer: Self-pay | Admitting: Unknown Physician Specialty

## 2023-06-03 DIAGNOSIS — R221 Localized swelling, mass and lump, neck: Secondary | ICD-10-CM | POA: Diagnosis not present

## 2023-06-03 DIAGNOSIS — Z85831 Personal history of malignant neoplasm of soft tissue: Secondary | ICD-10-CM | POA: Diagnosis not present

## 2023-06-03 HISTORY — DX: ST elevation (STEMI) myocardial infarction involving other sites: I21.29

## 2023-06-03 HISTORY — DX: Cor pulmonale (chronic): I27.81

## 2023-06-03 HISTORY — DX: Peripheral vascular disease, unspecified: I73.9

## 2023-06-03 HISTORY — DX: Pulmonary hypertension, unspecified: I27.20

## 2023-06-03 HISTORY — DX: Hyperlipidemia, unspecified: E78.5

## 2023-06-03 HISTORY — DX: Atherosclerosis of aorta: I70.0

## 2023-06-03 HISTORY — DX: Long term (current) use of anticoagulants: Z79.01

## 2023-06-03 SURGERY — REPAIR, HERNIA, INGUINAL, BILATERAL, ROBOT-ASSISTED
Anesthesia: General | Site: Inguinal | Laterality: Bilateral

## 2023-06-10 ENCOUNTER — Ambulatory Visit
Admission: RE | Admit: 2023-06-10 | Discharge: 2023-06-10 | Disposition: A | Discharge status: Home / Self Care | Payer: Medicare HMO | Source: Ambulatory Visit | Attending: Unknown Physician Specialty | Admitting: Unknown Physician Specialty

## 2023-06-10 DIAGNOSIS — R221 Localized swelling, mass and lump, neck: Secondary | ICD-10-CM

## 2023-06-10 DIAGNOSIS — R220 Localized swelling, mass and lump, head: Secondary | ICD-10-CM | POA: Diagnosis not present

## 2023-06-15 ENCOUNTER — Ambulatory Visit: Payer: Medicare HMO | Attending: Radiation Oncology | Admitting: Radiation Oncology

## 2023-06-16 ENCOUNTER — Institutional Professional Consult (permissible substitution): Payer: Medicare HMO | Admitting: Radiation Oncology

## 2023-06-23 ENCOUNTER — Other Ambulatory Visit: Payer: Self-pay | Admitting: Unknown Physician Specialty

## 2023-06-23 DIAGNOSIS — R599 Enlarged lymph nodes, unspecified: Secondary | ICD-10-CM

## 2023-06-29 DIAGNOSIS — M79621 Pain in right upper arm: Secondary | ICD-10-CM | POA: Diagnosis not present

## 2023-07-01 DIAGNOSIS — R55 Syncope and collapse: Secondary | ICD-10-CM | POA: Diagnosis not present

## 2023-07-01 DIAGNOSIS — G909 Disorder of the autonomic nervous system, unspecified: Secondary | ICD-10-CM | POA: Diagnosis not present

## 2023-07-07 DIAGNOSIS — M545 Low back pain, unspecified: Secondary | ICD-10-CM | POA: Diagnosis not present

## 2023-07-07 DIAGNOSIS — S40011A Contusion of right shoulder, initial encounter: Secondary | ICD-10-CM | POA: Diagnosis not present

## 2023-07-08 NOTE — Progress Notes (Signed)
Patient for US guided Core RT LN Biopsy on Thurs 07/09/23, I called and spoke with the patient's daughter, [name] on the phone and gave pre-procedure instructions. [name] was made aware to have the patient here at 1pm and check in at the [facility] [employer] [facility]. [name] stated understanding.  Called 07/08/2023

## 2023-07-09 ENCOUNTER — Ambulatory Visit
Admission: RE | Admit: 2023-07-09 | Discharge: 2023-07-09 | Disposition: A | Discharge status: Home / Self Care | Payer: Medicare HMO | Source: Ambulatory Visit | Attending: Unknown Physician Specialty | Admitting: Unknown Physician Specialty

## 2023-07-09 DIAGNOSIS — C770 Secondary and unspecified malignant neoplasm of lymph nodes of head, face and neck: Secondary | ICD-10-CM | POA: Insufficient documentation

## 2023-07-09 DIAGNOSIS — R599 Enlarged lymph nodes, unspecified: Secondary | ICD-10-CM | POA: Diagnosis not present

## 2023-07-09 DIAGNOSIS — C801 Malignant (primary) neoplasm, unspecified: Secondary | ICD-10-CM | POA: Diagnosis not present

## 2023-07-09 MED ORDER — LIDOCAINE HCL (PF) 1 % IJ SOLN
5.0000 mL | Freq: Once | INTRAMUSCULAR | Status: AC
  Administered 2023-07-09: 5 mL via INTRADERMAL
  Filled 2023-07-09: qty 5

## 2023-07-09 NOTE — Discharge Instructions (Signed)
Discharge instructions reviewed with patient.

## 2023-07-10 DIAGNOSIS — I509 Heart failure, unspecified: Secondary | ICD-10-CM | POA: Diagnosis not present

## 2023-07-10 DIAGNOSIS — Z9181 History of falling: Secondary | ICD-10-CM | POA: Diagnosis not present

## 2023-07-10 DIAGNOSIS — Z7901 Long term (current) use of anticoagulants: Secondary | ICD-10-CM | POA: Diagnosis not present

## 2023-07-10 DIAGNOSIS — I8390 Asymptomatic varicose veins of unspecified lower extremity: Secondary | ICD-10-CM | POA: Diagnosis not present

## 2023-07-10 DIAGNOSIS — S40011D Contusion of right shoulder, subsequent encounter: Secondary | ICD-10-CM | POA: Diagnosis not present

## 2023-07-10 DIAGNOSIS — Z7982 Long term (current) use of aspirin: Secondary | ICD-10-CM | POA: Diagnosis not present

## 2023-07-10 DIAGNOSIS — W19XXXD Unspecified fall, subsequent encounter: Secondary | ICD-10-CM | POA: Diagnosis not present

## 2023-07-10 DIAGNOSIS — G8911 Acute pain due to trauma: Secondary | ICD-10-CM | POA: Diagnosis not present

## 2023-07-10 DIAGNOSIS — M545 Low back pain, unspecified: Secondary | ICD-10-CM | POA: Diagnosis not present

## 2023-07-14 DIAGNOSIS — M545 Low back pain, unspecified: Secondary | ICD-10-CM | POA: Diagnosis not present

## 2023-07-14 DIAGNOSIS — Z9181 History of falling: Secondary | ICD-10-CM | POA: Diagnosis not present

## 2023-07-14 DIAGNOSIS — W19XXXD Unspecified fall, subsequent encounter: Secondary | ICD-10-CM | POA: Diagnosis not present

## 2023-07-14 DIAGNOSIS — I509 Heart failure, unspecified: Secondary | ICD-10-CM | POA: Diagnosis not present

## 2023-07-14 DIAGNOSIS — S40011D Contusion of right shoulder, subsequent encounter: Secondary | ICD-10-CM | POA: Diagnosis not present

## 2023-07-14 DIAGNOSIS — Z7982 Long term (current) use of aspirin: Secondary | ICD-10-CM | POA: Diagnosis not present

## 2023-07-14 DIAGNOSIS — G8911 Acute pain due to trauma: Secondary | ICD-10-CM | POA: Diagnosis not present

## 2023-07-14 DIAGNOSIS — Z7901 Long term (current) use of anticoagulants: Secondary | ICD-10-CM | POA: Diagnosis not present

## 2023-07-14 DIAGNOSIS — I8390 Asymptomatic varicose veins of unspecified lower extremity: Secondary | ICD-10-CM | POA: Diagnosis not present

## 2023-07-15 ENCOUNTER — Encounter: Payer: Self-pay | Admitting: Radiation Oncology

## 2023-07-15 ENCOUNTER — Ambulatory Visit
Admission: RE | Admit: 2023-07-15 | Discharge: 2023-07-15 | Disposition: A | Discharge status: Home / Self Care | Payer: Medicare HMO | Source: Ambulatory Visit | Attending: Radiation Oncology | Admitting: Radiation Oncology

## 2023-07-15 VITALS — BP 130/61 | HR 73 | Temp 97.2°F | Resp 16

## 2023-07-15 DIAGNOSIS — C4442 Squamous cell carcinoma of skin of scalp and neck: Secondary | ICD-10-CM | POA: Insufficient documentation

## 2023-07-15 DIAGNOSIS — C770 Secondary and unspecified malignant neoplasm of lymph nodes of head, face and neck: Secondary | ICD-10-CM | POA: Insufficient documentation

## 2023-07-15 DIAGNOSIS — Z923 Personal history of irradiation: Secondary | ICD-10-CM | POA: Insufficient documentation

## 2023-07-15 DIAGNOSIS — R221 Localized swelling, mass and lump, neck: Secondary | ICD-10-CM

## 2023-07-15 NOTE — Progress Notes (Signed)
Radiation Oncology Follow up Note  Name: [name] [name]   Date:   07/15/2023 MRN:  [id number] DOB: 21-Feb-1929    This 89 y.o. male presents to the clinic today for reevaluation of recurrent squamous cell carcinoma in his right neck and patient previously treated 10 months prior to his right neck status post resection of squamous cell carcinoma of the skin with 1 of 3 positive nodes in the neck.Marland Kitchen  REFERRING PROVIDER: [name], MD  HPI: Patient is a 89 year old male well-known to our department with now out approximately 10 months having completed adjuvant radiation therapy to his right neck for locally advanced squamous cell carcinoma of the skin.  He popped up within another enlarged node in the right neck underwent core biopsy which was positive for.  Metastatic squamous cell carcinoma.  He continues to be asymptomatic specifically denies head and neck pain or dysphagia.  COMPLICATIONS OF TREATMENT: none  FOLLOW UP COMPLIANCE: keeps appointments   PHYSICAL EXAM:  BP 130/61   Pulse 73   Temp (!) 97.2 F (36.2 C) (Tympanic)   Resp 16  Patient does have enlarged adenopathy in the right neck in the subdigastric region.  No other adenopathy in the neck is palpated.  No supraclavicular adenopathy is identified.  Elderly frail male in NAD.  Wheelchair-bound.  Well-developed well-nourished patient in NAD. HEENT reveals PERLA, EOMI, discs not visualized.  Oral cavity is clear. No oral mucosal lesions are identified. Neck is clear without evidence of cervical or supraclavicular adenopathy. Lungs are clear to A&P. Cardiac examination is essentially unremarkable with regular rate and rhythm without murmur rub or thrill. Abdomen is benign with no organomegaly or masses noted. Motor sensory and DTR levels are equal and symmetric in the upper and lower extremities. Cranial nerves II through XII are grossly intact. Proprioception is intact. No peripheral adenopathy or edema is identified. No motor or  sensory levels are noted. Crude visual fields are within normal range.  RADIOLOGY RESULTS: PET CT scan ordered  PLAN: At this time of ordered a PET CT scan to delineate exactly what type of recurrent disease we are dealing with.  I believe we will be able to do SBRT to area of solitary recurrence treating up to 30 Gray in 5 fractions.  After PET scan we will review those results with the patient and proceed to simulation if that is possible.  Family and patient comprehend the plan well.  I would like to take this opportunity to thank you for allowing me to participate in the care of your patient.Carmina Miller, MD

## 2023-07-16 DIAGNOSIS — Z9181 History of falling: Secondary | ICD-10-CM | POA: Diagnosis not present

## 2023-07-16 DIAGNOSIS — I8390 Asymptomatic varicose veins of unspecified lower extremity: Secondary | ICD-10-CM | POA: Diagnosis not present

## 2023-07-16 DIAGNOSIS — I509 Heart failure, unspecified: Secondary | ICD-10-CM | POA: Diagnosis not present

## 2023-07-16 DIAGNOSIS — W19XXXD Unspecified fall, subsequent encounter: Secondary | ICD-10-CM | POA: Diagnosis not present

## 2023-07-16 DIAGNOSIS — Z7901 Long term (current) use of anticoagulants: Secondary | ICD-10-CM | POA: Diagnosis not present

## 2023-07-16 DIAGNOSIS — M545 Low back pain, unspecified: Secondary | ICD-10-CM | POA: Diagnosis not present

## 2023-07-16 DIAGNOSIS — S40011D Contusion of right shoulder, subsequent encounter: Secondary | ICD-10-CM | POA: Diagnosis not present

## 2023-07-16 DIAGNOSIS — G8911 Acute pain due to trauma: Secondary | ICD-10-CM | POA: Diagnosis not present

## 2023-07-16 DIAGNOSIS — Z7982 Long term (current) use of aspirin: Secondary | ICD-10-CM | POA: Diagnosis not present

## 2023-07-21 ENCOUNTER — Ambulatory Visit: Payer: Medicare HMO | Admitting: Radiation Oncology

## 2023-07-21 DIAGNOSIS — M545 Low back pain, unspecified: Secondary | ICD-10-CM | POA: Diagnosis not present

## 2023-07-21 DIAGNOSIS — Z7901 Long term (current) use of anticoagulants: Secondary | ICD-10-CM | POA: Diagnosis not present

## 2023-07-21 DIAGNOSIS — I509 Heart failure, unspecified: Secondary | ICD-10-CM | POA: Diagnosis not present

## 2023-07-21 DIAGNOSIS — G8911 Acute pain due to trauma: Secondary | ICD-10-CM | POA: Diagnosis not present

## 2023-07-21 DIAGNOSIS — Z9181 History of falling: Secondary | ICD-10-CM | POA: Diagnosis not present

## 2023-07-21 DIAGNOSIS — S40011D Contusion of right shoulder, subsequent encounter: Secondary | ICD-10-CM | POA: Diagnosis not present

## 2023-07-21 DIAGNOSIS — W19XXXD Unspecified fall, subsequent encounter: Secondary | ICD-10-CM | POA: Diagnosis not present

## 2023-07-21 DIAGNOSIS — I8390 Asymptomatic varicose veins of unspecified lower extremity: Secondary | ICD-10-CM | POA: Diagnosis not present

## 2023-07-21 DIAGNOSIS — Z7982 Long term (current) use of aspirin: Secondary | ICD-10-CM | POA: Diagnosis not present

## 2023-07-24 ENCOUNTER — Ambulatory Visit: Payer: Medicare HMO

## 2023-07-24 ENCOUNTER — Ambulatory Visit
Admission: RE | Admit: 2023-07-24 | Discharge: 2023-07-24 | Disposition: A | Discharge status: Home / Self Care | Payer: Medicare HMO | Source: Ambulatory Visit | Attending: Radiation Oncology | Admitting: Radiation Oncology

## 2023-07-24 DIAGNOSIS — I700 Atherosclerosis of aorta: Secondary | ICD-10-CM | POA: Insufficient documentation

## 2023-07-24 DIAGNOSIS — K8020 Calculus of gallbladder without cholecystitis without obstruction: Secondary | ICD-10-CM | POA: Diagnosis not present

## 2023-07-24 DIAGNOSIS — I517 Cardiomegaly: Secondary | ICD-10-CM | POA: Insufficient documentation

## 2023-07-24 DIAGNOSIS — K4090 Unilateral inguinal hernia, without obstruction or gangrene, not specified as recurrent: Secondary | ICD-10-CM | POA: Insufficient documentation

## 2023-07-24 DIAGNOSIS — W19XXXD Unspecified fall, subsequent encounter: Secondary | ICD-10-CM | POA: Diagnosis not present

## 2023-07-24 DIAGNOSIS — G8911 Acute pain due to trauma: Secondary | ICD-10-CM | POA: Diagnosis not present

## 2023-07-24 DIAGNOSIS — K5730 Diverticulosis of large intestine without perforation or abscess without bleeding: Secondary | ICD-10-CM | POA: Insufficient documentation

## 2023-07-24 DIAGNOSIS — Z7901 Long term (current) use of anticoagulants: Secondary | ICD-10-CM | POA: Diagnosis not present

## 2023-07-24 DIAGNOSIS — R221 Localized swelling, mass and lump, neck: Secondary | ICD-10-CM | POA: Diagnosis not present

## 2023-07-24 DIAGNOSIS — M545 Low back pain, unspecified: Secondary | ICD-10-CM | POA: Diagnosis not present

## 2023-07-24 DIAGNOSIS — Z951 Presence of aortocoronary bypass graft: Secondary | ICD-10-CM | POA: Insufficient documentation

## 2023-07-24 DIAGNOSIS — Z7982 Long term (current) use of aspirin: Secondary | ICD-10-CM | POA: Diagnosis not present

## 2023-07-24 DIAGNOSIS — K118 Other diseases of salivary glands: Secondary | ICD-10-CM | POA: Diagnosis not present

## 2023-07-24 DIAGNOSIS — I509 Heart failure, unspecified: Secondary | ICD-10-CM | POA: Diagnosis not present

## 2023-07-24 DIAGNOSIS — Z9181 History of falling: Secondary | ICD-10-CM | POA: Diagnosis not present

## 2023-07-24 DIAGNOSIS — S40011D Contusion of right shoulder, subsequent encounter: Secondary | ICD-10-CM | POA: Diagnosis not present

## 2023-07-24 DIAGNOSIS — I8390 Asymptomatic varicose veins of unspecified lower extremity: Secondary | ICD-10-CM | POA: Diagnosis not present

## 2023-07-24 LAB — GLUCOSE, CAPILLARY: Glucose-Capillary: 80 mg/dL (ref 70–99)

## 2023-07-24 MED ORDER — FLUDEOXYGLUCOSE F - 18 (FDG) INJECTION
8.2600 | Freq: Once | INTRAVENOUS | Status: AC | PRN
  Administered 2023-07-24: 8.26 via INTRAVENOUS

## 2023-07-27 DIAGNOSIS — C4442 Squamous cell carcinoma of skin of scalp and neck: Secondary | ICD-10-CM | POA: Diagnosis not present

## 2023-07-27 DIAGNOSIS — C770 Secondary and unspecified malignant neoplasm of lymph nodes of head, face and neck: Secondary | ICD-10-CM | POA: Diagnosis not present

## 2023-07-30 ENCOUNTER — Ambulatory Visit
Admission: RE | Admit: 2023-07-30 | Discharge: 2023-07-30 | Disposition: A | Discharge status: Home / Self Care | Payer: Medicare HMO | Source: Ambulatory Visit | Attending: Radiation Oncology | Admitting: Radiation Oncology

## 2023-07-30 DIAGNOSIS — Z923 Personal history of irradiation: Secondary | ICD-10-CM | POA: Diagnosis not present

## 2023-07-30 DIAGNOSIS — Z9181 History of falling: Secondary | ICD-10-CM | POA: Diagnosis not present

## 2023-07-30 DIAGNOSIS — Z510 Encounter for antineoplastic radiation therapy: Secondary | ICD-10-CM | POA: Insufficient documentation

## 2023-07-30 DIAGNOSIS — M545 Low back pain, unspecified: Secondary | ICD-10-CM | POA: Diagnosis not present

## 2023-07-30 DIAGNOSIS — W19XXXD Unspecified fall, subsequent encounter: Secondary | ICD-10-CM | POA: Diagnosis not present

## 2023-07-30 DIAGNOSIS — C4442 Squamous cell carcinoma of skin of scalp and neck: Secondary | ICD-10-CM | POA: Insufficient documentation

## 2023-07-30 DIAGNOSIS — Z7901 Long term (current) use of anticoagulants: Secondary | ICD-10-CM | POA: Diagnosis not present

## 2023-07-30 DIAGNOSIS — C07 Malignant neoplasm of parotid gland: Secondary | ICD-10-CM | POA: Diagnosis not present

## 2023-07-30 DIAGNOSIS — I509 Heart failure, unspecified: Secondary | ICD-10-CM | POA: Diagnosis not present

## 2023-07-30 DIAGNOSIS — C770 Secondary and unspecified malignant neoplasm of lymph nodes of head, face and neck: Secondary | ICD-10-CM | POA: Diagnosis not present

## 2023-07-30 DIAGNOSIS — I2510 Atherosclerotic heart disease of native coronary artery without angina pectoris: Secondary | ICD-10-CM | POA: Diagnosis not present

## 2023-07-30 DIAGNOSIS — S40011D Contusion of right shoulder, subsequent encounter: Secondary | ICD-10-CM | POA: Diagnosis not present

## 2023-07-30 DIAGNOSIS — I8390 Asymptomatic varicose veins of unspecified lower extremity: Secondary | ICD-10-CM | POA: Diagnosis not present

## 2023-07-30 DIAGNOSIS — L97329 Non-pressure chronic ulcer of left ankle with unspecified severity: Secondary | ICD-10-CM | POA: Diagnosis not present

## 2023-07-30 DIAGNOSIS — G8911 Acute pain due to trauma: Secondary | ICD-10-CM | POA: Diagnosis not present

## 2023-07-30 DIAGNOSIS — Z7982 Long term (current) use of aspirin: Secondary | ICD-10-CM | POA: Diagnosis not present

## 2023-08-03 DIAGNOSIS — I509 Heart failure, unspecified: Secondary | ICD-10-CM | POA: Diagnosis not present

## 2023-08-03 DIAGNOSIS — I8390 Asymptomatic varicose veins of unspecified lower extremity: Secondary | ICD-10-CM | POA: Diagnosis not present

## 2023-08-03 DIAGNOSIS — G8911 Acute pain due to trauma: Secondary | ICD-10-CM | POA: Diagnosis not present

## 2023-08-03 DIAGNOSIS — Z7982 Long term (current) use of aspirin: Secondary | ICD-10-CM | POA: Diagnosis not present

## 2023-08-03 DIAGNOSIS — W19XXXD Unspecified fall, subsequent encounter: Secondary | ICD-10-CM | POA: Diagnosis not present

## 2023-08-03 DIAGNOSIS — Z9181 History of falling: Secondary | ICD-10-CM | POA: Diagnosis not present

## 2023-08-03 DIAGNOSIS — S40011D Contusion of right shoulder, subsequent encounter: Secondary | ICD-10-CM | POA: Diagnosis not present

## 2023-08-03 DIAGNOSIS — Z7901 Long term (current) use of anticoagulants: Secondary | ICD-10-CM | POA: Diagnosis not present

## 2023-08-03 DIAGNOSIS — M545 Low back pain, unspecified: Secondary | ICD-10-CM | POA: Diagnosis not present

## 2023-08-04 DIAGNOSIS — Z7982 Long term (current) use of aspirin: Secondary | ICD-10-CM | POA: Diagnosis not present

## 2023-08-04 DIAGNOSIS — M545 Low back pain, unspecified: Secondary | ICD-10-CM | POA: Diagnosis not present

## 2023-08-04 DIAGNOSIS — I8390 Asymptomatic varicose veins of unspecified lower extremity: Secondary | ICD-10-CM | POA: Diagnosis not present

## 2023-08-04 DIAGNOSIS — I509 Heart failure, unspecified: Secondary | ICD-10-CM | POA: Diagnosis not present

## 2023-08-04 DIAGNOSIS — S40011D Contusion of right shoulder, subsequent encounter: Secondary | ICD-10-CM | POA: Diagnosis not present

## 2023-08-04 DIAGNOSIS — G8911 Acute pain due to trauma: Secondary | ICD-10-CM | POA: Diagnosis not present

## 2023-08-04 DIAGNOSIS — W19XXXD Unspecified fall, subsequent encounter: Secondary | ICD-10-CM | POA: Diagnosis not present

## 2023-08-04 DIAGNOSIS — Z9181 History of falling: Secondary | ICD-10-CM | POA: Diagnosis not present

## 2023-08-04 DIAGNOSIS — Z7901 Long term (current) use of anticoagulants: Secondary | ICD-10-CM | POA: Diagnosis not present

## 2023-08-06 DIAGNOSIS — Z510 Encounter for antineoplastic radiation therapy: Secondary | ICD-10-CM | POA: Diagnosis not present

## 2023-08-06 DIAGNOSIS — Z923 Personal history of irradiation: Secondary | ICD-10-CM | POA: Diagnosis not present

## 2023-08-06 DIAGNOSIS — C4442 Squamous cell carcinoma of skin of scalp and neck: Secondary | ICD-10-CM | POA: Diagnosis not present

## 2023-08-06 DIAGNOSIS — C770 Secondary and unspecified malignant neoplasm of lymph nodes of head, face and neck: Secondary | ICD-10-CM | POA: Diagnosis not present

## 2023-08-07 DIAGNOSIS — G8911 Acute pain due to trauma: Secondary | ICD-10-CM | POA: Diagnosis not present

## 2023-08-07 DIAGNOSIS — I8390 Asymptomatic varicose veins of unspecified lower extremity: Secondary | ICD-10-CM | POA: Diagnosis not present

## 2023-08-07 DIAGNOSIS — Z7982 Long term (current) use of aspirin: Secondary | ICD-10-CM | POA: Diagnosis not present

## 2023-08-07 DIAGNOSIS — S40011D Contusion of right shoulder, subsequent encounter: Secondary | ICD-10-CM | POA: Diagnosis not present

## 2023-08-07 DIAGNOSIS — Z7901 Long term (current) use of anticoagulants: Secondary | ICD-10-CM | POA: Diagnosis not present

## 2023-08-07 DIAGNOSIS — W19XXXD Unspecified fall, subsequent encounter: Secondary | ICD-10-CM | POA: Diagnosis not present

## 2023-08-07 DIAGNOSIS — I509 Heart failure, unspecified: Secondary | ICD-10-CM | POA: Diagnosis not present

## 2023-08-07 DIAGNOSIS — Z9181 History of falling: Secondary | ICD-10-CM | POA: Diagnosis not present

## 2023-08-07 DIAGNOSIS — M545 Low back pain, unspecified: Secondary | ICD-10-CM | POA: Diagnosis not present

## 2023-08-10 DIAGNOSIS — I509 Heart failure, unspecified: Secondary | ICD-10-CM | POA: Diagnosis not present

## 2023-08-10 DIAGNOSIS — W19XXXD Unspecified fall, subsequent encounter: Secondary | ICD-10-CM | POA: Diagnosis not present

## 2023-08-10 DIAGNOSIS — S40011D Contusion of right shoulder, subsequent encounter: Secondary | ICD-10-CM | POA: Diagnosis not present

## 2023-08-10 DIAGNOSIS — G8911 Acute pain due to trauma: Secondary | ICD-10-CM | POA: Diagnosis not present

## 2023-08-10 DIAGNOSIS — I8390 Asymptomatic varicose veins of unspecified lower extremity: Secondary | ICD-10-CM | POA: Diagnosis not present

## 2023-08-10 DIAGNOSIS — M545 Low back pain, unspecified: Secondary | ICD-10-CM | POA: Diagnosis not present

## 2023-08-10 DIAGNOSIS — Z7982 Long term (current) use of aspirin: Secondary | ICD-10-CM | POA: Diagnosis not present

## 2023-08-10 DIAGNOSIS — Z9181 History of falling: Secondary | ICD-10-CM | POA: Diagnosis not present

## 2023-08-10 DIAGNOSIS — Z7901 Long term (current) use of anticoagulants: Secondary | ICD-10-CM | POA: Diagnosis not present

## 2023-08-12 ENCOUNTER — Other Ambulatory Visit: Payer: Self-pay

## 2023-08-12 ENCOUNTER — Ambulatory Visit: Admission: RE | Admit: 2023-08-12 | Payer: Medicare HMO | Source: Ambulatory Visit

## 2023-08-12 DIAGNOSIS — Z510 Encounter for antineoplastic radiation therapy: Secondary | ICD-10-CM | POA: Diagnosis not present

## 2023-08-12 DIAGNOSIS — Z9181 History of falling: Secondary | ICD-10-CM | POA: Diagnosis not present

## 2023-08-12 DIAGNOSIS — G8911 Acute pain due to trauma: Secondary | ICD-10-CM | POA: Diagnosis not present

## 2023-08-12 DIAGNOSIS — M545 Low back pain, unspecified: Secondary | ICD-10-CM | POA: Diagnosis not present

## 2023-08-12 DIAGNOSIS — Z923 Personal history of irradiation: Secondary | ICD-10-CM | POA: Diagnosis not present

## 2023-08-12 DIAGNOSIS — I8390 Asymptomatic varicose veins of unspecified lower extremity: Secondary | ICD-10-CM | POA: Diagnosis not present

## 2023-08-12 DIAGNOSIS — Z7901 Long term (current) use of anticoagulants: Secondary | ICD-10-CM | POA: Diagnosis not present

## 2023-08-12 DIAGNOSIS — Z7982 Long term (current) use of aspirin: Secondary | ICD-10-CM | POA: Diagnosis not present

## 2023-08-12 DIAGNOSIS — C770 Secondary and unspecified malignant neoplasm of lymph nodes of head, face and neck: Secondary | ICD-10-CM | POA: Diagnosis not present

## 2023-08-12 DIAGNOSIS — I509 Heart failure, unspecified: Secondary | ICD-10-CM | POA: Diagnosis not present

## 2023-08-12 DIAGNOSIS — C4442 Squamous cell carcinoma of skin of scalp and neck: Secondary | ICD-10-CM | POA: Diagnosis not present

## 2023-08-12 DIAGNOSIS — S40011D Contusion of right shoulder, subsequent encounter: Secondary | ICD-10-CM | POA: Diagnosis not present

## 2023-08-12 DIAGNOSIS — W19XXXD Unspecified fall, subsequent encounter: Secondary | ICD-10-CM | POA: Diagnosis not present

## 2023-08-12 LAB — RAD ONC ARIA SESSION SUMMARY
Course Elapsed Days: 0
Plan Fractions Treated to Date: 1
Plan Prescribed Dose Per Fraction: 6 Gy
Plan Total Fractions Prescribed: 5
Plan Total Prescribed Dose: 30 Gy
Reference Point Dosage Given to Date: 6 Gy
Reference Point Session Dosage Given: 6 Gy
Session Number: 1

## 2023-08-13 ENCOUNTER — Telehealth: Payer: Self-pay | Admitting: *Deleted

## 2023-08-13 DIAGNOSIS — Z7982 Long term (current) use of aspirin: Secondary | ICD-10-CM | POA: Diagnosis not present

## 2023-08-13 DIAGNOSIS — I8390 Asymptomatic varicose veins of unspecified lower extremity: Secondary | ICD-10-CM | POA: Diagnosis not present

## 2023-08-13 DIAGNOSIS — M545 Low back pain, unspecified: Secondary | ICD-10-CM | POA: Diagnosis not present

## 2023-08-13 DIAGNOSIS — G8911 Acute pain due to trauma: Secondary | ICD-10-CM | POA: Diagnosis not present

## 2023-08-13 DIAGNOSIS — Z9181 History of falling: Secondary | ICD-10-CM | POA: Diagnosis not present

## 2023-08-13 DIAGNOSIS — I509 Heart failure, unspecified: Secondary | ICD-10-CM | POA: Diagnosis not present

## 2023-08-13 DIAGNOSIS — Z7901 Long term (current) use of anticoagulants: Secondary | ICD-10-CM | POA: Diagnosis not present

## 2023-08-13 DIAGNOSIS — S40011D Contusion of right shoulder, subsequent encounter: Secondary | ICD-10-CM | POA: Diagnosis not present

## 2023-08-13 DIAGNOSIS — W19XXXD Unspecified fall, subsequent encounter: Secondary | ICD-10-CM | POA: Diagnosis not present

## 2023-08-13 NOTE — Telephone Encounter (Signed)
Patients daughter called to report that patient was nauseous  and had vomited twice. Write advised her to call PCP for evaluation and treatment if necessary. Radiation to the face would not be the cause of his current condition.

## 2023-08-14 ENCOUNTER — Other Ambulatory Visit: Payer: Self-pay

## 2023-08-14 ENCOUNTER — Ambulatory Visit
Admission: RE | Admit: 2023-08-14 | Discharge: 2023-08-14 | Disposition: A | Discharge status: Home / Self Care | Payer: Medicare HMO | Source: Ambulatory Visit | Attending: Radiation Oncology | Admitting: Radiation Oncology

## 2023-08-14 DIAGNOSIS — C770 Secondary and unspecified malignant neoplasm of lymph nodes of head, face and neck: Secondary | ICD-10-CM | POA: Diagnosis not present

## 2023-08-14 DIAGNOSIS — C4442 Squamous cell carcinoma of skin of scalp and neck: Secondary | ICD-10-CM | POA: Diagnosis not present

## 2023-08-14 DIAGNOSIS — Z923 Personal history of irradiation: Secondary | ICD-10-CM | POA: Diagnosis not present

## 2023-08-14 DIAGNOSIS — Z510 Encounter for antineoplastic radiation therapy: Secondary | ICD-10-CM | POA: Diagnosis not present

## 2023-08-14 LAB — RAD ONC ARIA SESSION SUMMARY
Course Elapsed Days: 2
Plan Fractions Treated to Date: 2
Plan Prescribed Dose Per Fraction: 6 Gy
Plan Total Fractions Prescribed: 5
Plan Total Prescribed Dose: 30 Gy
Reference Point Dosage Given to Date: 12 Gy
Reference Point Session Dosage Given: 6 Gy
Session Number: 2

## 2023-08-18 ENCOUNTER — Other Ambulatory Visit: Payer: Self-pay

## 2023-08-18 ENCOUNTER — Ambulatory Visit
Admission: RE | Admit: 2023-08-18 | Discharge: 2023-08-18 | Disposition: A | Discharge status: Home / Self Care | Payer: Medicare HMO | Source: Ambulatory Visit | Attending: Radiation Oncology | Admitting: Radiation Oncology

## 2023-08-18 DIAGNOSIS — C770 Secondary and unspecified malignant neoplasm of lymph nodes of head, face and neck: Secondary | ICD-10-CM | POA: Diagnosis not present

## 2023-08-18 DIAGNOSIS — M545 Low back pain, unspecified: Secondary | ICD-10-CM | POA: Diagnosis not present

## 2023-08-18 DIAGNOSIS — S40011D Contusion of right shoulder, subsequent encounter: Secondary | ICD-10-CM | POA: Diagnosis not present

## 2023-08-18 DIAGNOSIS — G8911 Acute pain due to trauma: Secondary | ICD-10-CM | POA: Diagnosis not present

## 2023-08-18 DIAGNOSIS — Z7982 Long term (current) use of aspirin: Secondary | ICD-10-CM | POA: Diagnosis not present

## 2023-08-18 DIAGNOSIS — Z9181 History of falling: Secondary | ICD-10-CM | POA: Diagnosis not present

## 2023-08-18 DIAGNOSIS — I8390 Asymptomatic varicose veins of unspecified lower extremity: Secondary | ICD-10-CM | POA: Diagnosis not present

## 2023-08-18 DIAGNOSIS — Z923 Personal history of irradiation: Secondary | ICD-10-CM | POA: Diagnosis not present

## 2023-08-18 DIAGNOSIS — I509 Heart failure, unspecified: Secondary | ICD-10-CM | POA: Diagnosis not present

## 2023-08-18 DIAGNOSIS — C4442 Squamous cell carcinoma of skin of scalp and neck: Secondary | ICD-10-CM | POA: Diagnosis not present

## 2023-08-18 DIAGNOSIS — Z7901 Long term (current) use of anticoagulants: Secondary | ICD-10-CM | POA: Diagnosis not present

## 2023-08-18 DIAGNOSIS — W19XXXD Unspecified fall, subsequent encounter: Secondary | ICD-10-CM | POA: Diagnosis not present

## 2023-08-18 DIAGNOSIS — Z510 Encounter for antineoplastic radiation therapy: Secondary | ICD-10-CM | POA: Diagnosis not present

## 2023-08-18 LAB — RAD ONC ARIA SESSION SUMMARY
Course Elapsed Days: 6
Plan Fractions Treated to Date: 3
Plan Prescribed Dose Per Fraction: 6 Gy
Plan Total Fractions Prescribed: 5
Plan Total Prescribed Dose: 30 Gy
Reference Point Dosage Given to Date: 18 Gy
Reference Point Session Dosage Given: 6 Gy
Session Number: 3

## 2023-08-20 ENCOUNTER — Other Ambulatory Visit: Payer: Self-pay

## 2023-08-20 ENCOUNTER — Ambulatory Visit
Admission: RE | Admit: 2023-08-20 | Discharge: 2023-08-20 | Disposition: A | Discharge status: Home / Self Care | Payer: Medicare HMO | Source: Ambulatory Visit | Attending: Radiation Oncology | Admitting: Radiation Oncology

## 2023-08-20 DIAGNOSIS — Z923 Personal history of irradiation: Secondary | ICD-10-CM | POA: Diagnosis not present

## 2023-08-20 DIAGNOSIS — C770 Secondary and unspecified malignant neoplasm of lymph nodes of head, face and neck: Secondary | ICD-10-CM | POA: Diagnosis not present

## 2023-08-20 DIAGNOSIS — C4442 Squamous cell carcinoma of skin of scalp and neck: Secondary | ICD-10-CM | POA: Diagnosis not present

## 2023-08-20 DIAGNOSIS — Z510 Encounter for antineoplastic radiation therapy: Secondary | ICD-10-CM | POA: Diagnosis not present

## 2023-08-20 LAB — RAD ONC ARIA SESSION SUMMARY
Course Elapsed Days: 8
Plan Fractions Treated to Date: 4
Plan Prescribed Dose Per Fraction: 6 Gy
Plan Total Fractions Prescribed: 5
Plan Total Prescribed Dose: 30 Gy
Reference Point Dosage Given to Date: 24 Gy
Reference Point Session Dosage Given: 6 Gy
Session Number: 4

## 2023-08-21 DIAGNOSIS — S40011D Contusion of right shoulder, subsequent encounter: Secondary | ICD-10-CM | POA: Diagnosis not present

## 2023-08-21 DIAGNOSIS — G8911 Acute pain due to trauma: Secondary | ICD-10-CM | POA: Diagnosis not present

## 2023-08-21 DIAGNOSIS — I509 Heart failure, unspecified: Secondary | ICD-10-CM | POA: Diagnosis not present

## 2023-08-21 DIAGNOSIS — Z7901 Long term (current) use of anticoagulants: Secondary | ICD-10-CM | POA: Diagnosis not present

## 2023-08-21 DIAGNOSIS — Z7982 Long term (current) use of aspirin: Secondary | ICD-10-CM | POA: Diagnosis not present

## 2023-08-21 DIAGNOSIS — M545 Low back pain, unspecified: Secondary | ICD-10-CM | POA: Diagnosis not present

## 2023-08-21 DIAGNOSIS — Z9181 History of falling: Secondary | ICD-10-CM | POA: Diagnosis not present

## 2023-08-21 DIAGNOSIS — W19XXXD Unspecified fall, subsequent encounter: Secondary | ICD-10-CM | POA: Diagnosis not present

## 2023-08-21 DIAGNOSIS — I8390 Asymptomatic varicose veins of unspecified lower extremity: Secondary | ICD-10-CM | POA: Diagnosis not present

## 2023-08-24 DIAGNOSIS — I739 Peripheral vascular disease, unspecified: Secondary | ICD-10-CM | POA: Diagnosis not present

## 2023-08-24 DIAGNOSIS — I509 Heart failure, unspecified: Secondary | ICD-10-CM | POA: Diagnosis not present

## 2023-08-24 DIAGNOSIS — C07 Malignant neoplasm of parotid gland: Secondary | ICD-10-CM | POA: Diagnosis not present

## 2023-08-24 DIAGNOSIS — I2510 Atherosclerotic heart disease of native coronary artery without angina pectoris: Secondary | ICD-10-CM | POA: Diagnosis not present

## 2023-08-24 DIAGNOSIS — L97329 Non-pressure chronic ulcer of left ankle with unspecified severity: Secondary | ICD-10-CM | POA: Diagnosis not present

## 2023-08-24 DIAGNOSIS — M7918 Myalgia, other site: Secondary | ICD-10-CM | POA: Diagnosis not present

## 2023-08-24 DIAGNOSIS — M5431 Sciatica, right side: Secondary | ICD-10-CM | POA: Diagnosis not present

## 2023-08-25 ENCOUNTER — Other Ambulatory Visit: Payer: Self-pay

## 2023-08-25 ENCOUNTER — Ambulatory Visit
Admission: RE | Admit: 2023-08-25 | Discharge: 2023-08-25 | Disposition: A | Discharge status: Home / Self Care | Payer: Medicare HMO | Source: Ambulatory Visit | Attending: Radiation Oncology | Admitting: Radiation Oncology

## 2023-08-25 DIAGNOSIS — Z9181 History of falling: Secondary | ICD-10-CM | POA: Diagnosis not present

## 2023-08-25 DIAGNOSIS — S40011D Contusion of right shoulder, subsequent encounter: Secondary | ICD-10-CM | POA: Diagnosis not present

## 2023-08-25 DIAGNOSIS — Z510 Encounter for antineoplastic radiation therapy: Secondary | ICD-10-CM | POA: Diagnosis not present

## 2023-08-25 DIAGNOSIS — G8911 Acute pain due to trauma: Secondary | ICD-10-CM | POA: Diagnosis not present

## 2023-08-25 DIAGNOSIS — I509 Heart failure, unspecified: Secondary | ICD-10-CM | POA: Diagnosis not present

## 2023-08-25 DIAGNOSIS — W19XXXD Unspecified fall, subsequent encounter: Secondary | ICD-10-CM | POA: Diagnosis not present

## 2023-08-25 DIAGNOSIS — C4442 Squamous cell carcinoma of skin of scalp and neck: Secondary | ICD-10-CM | POA: Diagnosis not present

## 2023-08-25 DIAGNOSIS — M545 Low back pain, unspecified: Secondary | ICD-10-CM | POA: Diagnosis not present

## 2023-08-25 DIAGNOSIS — Z7982 Long term (current) use of aspirin: Secondary | ICD-10-CM | POA: Diagnosis not present

## 2023-08-25 DIAGNOSIS — C770 Secondary and unspecified malignant neoplasm of lymph nodes of head, face and neck: Secondary | ICD-10-CM | POA: Diagnosis not present

## 2023-08-25 DIAGNOSIS — Z923 Personal history of irradiation: Secondary | ICD-10-CM | POA: Diagnosis not present

## 2023-08-25 DIAGNOSIS — Z7901 Long term (current) use of anticoagulants: Secondary | ICD-10-CM | POA: Diagnosis not present

## 2023-08-25 DIAGNOSIS — I8390 Asymptomatic varicose veins of unspecified lower extremity: Secondary | ICD-10-CM | POA: Diagnosis not present

## 2023-08-25 LAB — RAD ONC ARIA SESSION SUMMARY
Course Elapsed Days: 13
Plan Fractions Treated to Date: 5
Plan Prescribed Dose Per Fraction: 6 Gy
Plan Total Fractions Prescribed: 5
Plan Total Prescribed Dose: 30 Gy
Reference Point Dosage Given to Date: 30 Gy
Reference Point Session Dosage Given: 6 Gy
Session Number: 5

## 2023-08-28 DIAGNOSIS — Z9181 History of falling: Secondary | ICD-10-CM | POA: Diagnosis not present

## 2023-08-28 DIAGNOSIS — I8390 Asymptomatic varicose veins of unspecified lower extremity: Secondary | ICD-10-CM | POA: Diagnosis not present

## 2023-08-28 DIAGNOSIS — Z7982 Long term (current) use of aspirin: Secondary | ICD-10-CM | POA: Diagnosis not present

## 2023-08-28 DIAGNOSIS — M545 Low back pain, unspecified: Secondary | ICD-10-CM | POA: Diagnosis not present

## 2023-08-28 DIAGNOSIS — S40011D Contusion of right shoulder, subsequent encounter: Secondary | ICD-10-CM | POA: Diagnosis not present

## 2023-08-28 DIAGNOSIS — G8911 Acute pain due to trauma: Secondary | ICD-10-CM | POA: Diagnosis not present

## 2023-08-28 DIAGNOSIS — W19XXXD Unspecified fall, subsequent encounter: Secondary | ICD-10-CM | POA: Diagnosis not present

## 2023-08-28 DIAGNOSIS — I509 Heart failure, unspecified: Secondary | ICD-10-CM | POA: Diagnosis not present

## 2023-08-28 DIAGNOSIS — Z7901 Long term (current) use of anticoagulants: Secondary | ICD-10-CM | POA: Diagnosis not present

## 2023-09-01 DIAGNOSIS — Z7982 Long term (current) use of aspirin: Secondary | ICD-10-CM | POA: Diagnosis not present

## 2023-09-01 DIAGNOSIS — I8390 Asymptomatic varicose veins of unspecified lower extremity: Secondary | ICD-10-CM | POA: Diagnosis not present

## 2023-09-01 DIAGNOSIS — Z9181 History of falling: Secondary | ICD-10-CM | POA: Diagnosis not present

## 2023-09-01 DIAGNOSIS — Z7901 Long term (current) use of anticoagulants: Secondary | ICD-10-CM | POA: Diagnosis not present

## 2023-09-01 DIAGNOSIS — I509 Heart failure, unspecified: Secondary | ICD-10-CM | POA: Diagnosis not present

## 2023-09-01 DIAGNOSIS — W19XXXD Unspecified fall, subsequent encounter: Secondary | ICD-10-CM | POA: Diagnosis not present

## 2023-09-01 DIAGNOSIS — G8911 Acute pain due to trauma: Secondary | ICD-10-CM | POA: Diagnosis not present

## 2023-09-01 DIAGNOSIS — M545 Low back pain, unspecified: Secondary | ICD-10-CM | POA: Diagnosis not present

## 2023-09-01 DIAGNOSIS — S40011D Contusion of right shoulder, subsequent encounter: Secondary | ICD-10-CM | POA: Diagnosis not present

## 2023-09-04 DIAGNOSIS — Z9181 History of falling: Secondary | ICD-10-CM | POA: Diagnosis not present

## 2023-09-04 DIAGNOSIS — G8911 Acute pain due to trauma: Secondary | ICD-10-CM | POA: Diagnosis not present

## 2023-09-04 DIAGNOSIS — Z7982 Long term (current) use of aspirin: Secondary | ICD-10-CM | POA: Diagnosis not present

## 2023-09-04 DIAGNOSIS — I509 Heart failure, unspecified: Secondary | ICD-10-CM | POA: Diagnosis not present

## 2023-09-04 DIAGNOSIS — W19XXXD Unspecified fall, subsequent encounter: Secondary | ICD-10-CM | POA: Diagnosis not present

## 2023-09-04 DIAGNOSIS — Z7901 Long term (current) use of anticoagulants: Secondary | ICD-10-CM | POA: Diagnosis not present

## 2023-09-04 DIAGNOSIS — M545 Low back pain, unspecified: Secondary | ICD-10-CM | POA: Diagnosis not present

## 2023-09-04 DIAGNOSIS — S40011D Contusion of right shoulder, subsequent encounter: Secondary | ICD-10-CM | POA: Diagnosis not present

## 2023-09-04 DIAGNOSIS — I8390 Asymptomatic varicose veins of unspecified lower extremity: Secondary | ICD-10-CM | POA: Diagnosis not present

## 2023-09-09 DIAGNOSIS — L97329 Non-pressure chronic ulcer of left ankle with unspecified severity: Secondary | ICD-10-CM | POA: Diagnosis not present

## 2023-09-09 DIAGNOSIS — M545 Low back pain, unspecified: Secondary | ICD-10-CM | POA: Diagnosis not present

## 2023-09-09 DIAGNOSIS — Z7901 Long term (current) use of anticoagulants: Secondary | ICD-10-CM | POA: Diagnosis not present

## 2023-09-09 DIAGNOSIS — Z7982 Long term (current) use of aspirin: Secondary | ICD-10-CM | POA: Diagnosis not present

## 2023-09-09 DIAGNOSIS — I509 Heart failure, unspecified: Secondary | ICD-10-CM | POA: Diagnosis not present

## 2023-09-09 DIAGNOSIS — S40011D Contusion of right shoulder, subsequent encounter: Secondary | ICD-10-CM | POA: Diagnosis not present

## 2023-09-09 DIAGNOSIS — G8911 Acute pain due to trauma: Secondary | ICD-10-CM | POA: Diagnosis not present

## 2023-09-09 DIAGNOSIS — I8390 Asymptomatic varicose veins of unspecified lower extremity: Secondary | ICD-10-CM | POA: Diagnosis not present

## 2023-09-09 DIAGNOSIS — I872 Venous insufficiency (chronic) (peripheral): Secondary | ICD-10-CM | POA: Diagnosis not present

## 2023-09-12 DIAGNOSIS — I872 Venous insufficiency (chronic) (peripheral): Secondary | ICD-10-CM | POA: Diagnosis not present

## 2023-09-12 DIAGNOSIS — G8911 Acute pain due to trauma: Secondary | ICD-10-CM | POA: Diagnosis not present

## 2023-09-12 DIAGNOSIS — M545 Low back pain, unspecified: Secondary | ICD-10-CM | POA: Diagnosis not present

## 2023-09-12 DIAGNOSIS — I8390 Asymptomatic varicose veins of unspecified lower extremity: Secondary | ICD-10-CM | POA: Diagnosis not present

## 2023-09-12 DIAGNOSIS — I509 Heart failure, unspecified: Secondary | ICD-10-CM | POA: Diagnosis not present

## 2023-09-12 DIAGNOSIS — S40011D Contusion of right shoulder, subsequent encounter: Secondary | ICD-10-CM | POA: Diagnosis not present

## 2023-09-12 DIAGNOSIS — Z7901 Long term (current) use of anticoagulants: Secondary | ICD-10-CM | POA: Diagnosis not present

## 2023-09-12 DIAGNOSIS — L97329 Non-pressure chronic ulcer of left ankle with unspecified severity: Secondary | ICD-10-CM | POA: Diagnosis not present

## 2023-09-12 DIAGNOSIS — Z7982 Long term (current) use of aspirin: Secondary | ICD-10-CM | POA: Diagnosis not present

## 2023-09-15 DIAGNOSIS — Z7901 Long term (current) use of anticoagulants: Secondary | ICD-10-CM | POA: Diagnosis not present

## 2023-09-15 DIAGNOSIS — G8911 Acute pain due to trauma: Secondary | ICD-10-CM | POA: Diagnosis not present

## 2023-09-15 DIAGNOSIS — S40011D Contusion of right shoulder, subsequent encounter: Secondary | ICD-10-CM | POA: Diagnosis not present

## 2023-09-15 DIAGNOSIS — L97329 Non-pressure chronic ulcer of left ankle with unspecified severity: Secondary | ICD-10-CM | POA: Diagnosis not present

## 2023-09-15 DIAGNOSIS — I872 Venous insufficiency (chronic) (peripheral): Secondary | ICD-10-CM | POA: Diagnosis not present

## 2023-09-15 DIAGNOSIS — Z7982 Long term (current) use of aspirin: Secondary | ICD-10-CM | POA: Diagnosis not present

## 2023-09-15 DIAGNOSIS — I509 Heart failure, unspecified: Secondary | ICD-10-CM | POA: Diagnosis not present

## 2023-09-15 DIAGNOSIS — M545 Low back pain, unspecified: Secondary | ICD-10-CM | POA: Diagnosis not present

## 2023-09-15 DIAGNOSIS — I8390 Asymptomatic varicose veins of unspecified lower extremity: Secondary | ICD-10-CM | POA: Diagnosis not present

## 2023-09-17 DIAGNOSIS — I4891 Unspecified atrial fibrillation: Secondary | ICD-10-CM | POA: Diagnosis not present

## 2023-09-17 DIAGNOSIS — I5023 Acute on chronic systolic (congestive) heart failure: Secondary | ICD-10-CM | POA: Diagnosis not present

## 2023-09-17 DIAGNOSIS — I2510 Atherosclerotic heart disease of native coronary artery without angina pectoris: Secondary | ICD-10-CM | POA: Diagnosis not present

## 2023-09-17 DIAGNOSIS — Z23 Encounter for immunization: Secondary | ICD-10-CM | POA: Diagnosis not present

## 2023-09-22 ENCOUNTER — Encounter: Payer: Medicare HMO | Attending: Physician Assistant | Admitting: Physician Assistant

## 2023-09-22 DIAGNOSIS — L97322 Non-pressure chronic ulcer of left ankle with fat layer exposed: Secondary | ICD-10-CM | POA: Insufficient documentation

## 2023-09-22 DIAGNOSIS — I5042 Chronic combined systolic (congestive) and diastolic (congestive) heart failure: Secondary | ICD-10-CM | POA: Insufficient documentation

## 2023-09-22 DIAGNOSIS — E1140 Type 2 diabetes mellitus with diabetic neuropathy, unspecified: Secondary | ICD-10-CM | POA: Diagnosis not present

## 2023-09-22 DIAGNOSIS — I2510 Atherosclerotic heart disease of native coronary artery without angina pectoris: Secondary | ICD-10-CM | POA: Insufficient documentation

## 2023-09-22 DIAGNOSIS — E1151 Type 2 diabetes mellitus with diabetic peripheral angiopathy without gangrene: Secondary | ICD-10-CM | POA: Diagnosis not present

## 2023-09-22 DIAGNOSIS — I480 Paroxysmal atrial fibrillation: Secondary | ICD-10-CM | POA: Diagnosis not present

## 2023-09-22 DIAGNOSIS — I110 Hypertensive heart disease with heart failure: Secondary | ICD-10-CM | POA: Diagnosis not present

## 2023-09-22 DIAGNOSIS — Z7901 Long term (current) use of anticoagulants: Secondary | ICD-10-CM | POA: Insufficient documentation

## 2023-09-22 DIAGNOSIS — I5020 Unspecified systolic (congestive) heart failure: Secondary | ICD-10-CM | POA: Diagnosis not present

## 2023-09-22 DIAGNOSIS — E11621 Type 2 diabetes mellitus with foot ulcer: Secondary | ICD-10-CM | POA: Insufficient documentation

## 2023-09-22 DIAGNOSIS — I87332 Chronic venous hypertension (idiopathic) with ulcer and inflammation of left lower extremity: Secondary | ICD-10-CM | POA: Diagnosis not present

## 2023-09-23 ENCOUNTER — Other Ambulatory Visit: Payer: Self-pay | Admitting: Internal Medicine

## 2023-09-23 ENCOUNTER — Ambulatory Visit
Admission: RE | Admit: 2023-09-23 | Discharge: 2023-09-23 | Disposition: A | Discharge status: Home / Self Care | Payer: Medicare HMO | Source: Ambulatory Visit | Attending: Internal Medicine | Admitting: Internal Medicine

## 2023-09-23 ENCOUNTER — Encounter: Payer: Medicare HMO | Admitting: Internal Medicine

## 2023-09-23 DIAGNOSIS — E11621 Type 2 diabetes mellitus with foot ulcer: Secondary | ICD-10-CM | POA: Diagnosis not present

## 2023-09-23 DIAGNOSIS — I480 Paroxysmal atrial fibrillation: Secondary | ICD-10-CM | POA: Diagnosis not present

## 2023-09-23 DIAGNOSIS — E1151 Type 2 diabetes mellitus with diabetic peripheral angiopathy without gangrene: Secondary | ICD-10-CM | POA: Diagnosis not present

## 2023-09-23 DIAGNOSIS — M79662 Pain in left lower leg: Secondary | ICD-10-CM | POA: Insufficient documentation

## 2023-09-23 DIAGNOSIS — L97322 Non-pressure chronic ulcer of left ankle with fat layer exposed: Secondary | ICD-10-CM | POA: Diagnosis not present

## 2023-09-23 DIAGNOSIS — I2510 Atherosclerotic heart disease of native coronary artery without angina pectoris: Secondary | ICD-10-CM | POA: Diagnosis not present

## 2023-09-23 DIAGNOSIS — I110 Hypertensive heart disease with heart failure: Secondary | ICD-10-CM | POA: Diagnosis not present

## 2023-09-23 DIAGNOSIS — I5042 Chronic combined systolic (congestive) and diastolic (congestive) heart failure: Secondary | ICD-10-CM | POA: Diagnosis not present

## 2023-09-23 DIAGNOSIS — Z7901 Long term (current) use of anticoagulants: Secondary | ICD-10-CM | POA: Diagnosis not present

## 2023-09-23 DIAGNOSIS — I87332 Chronic venous hypertension (idiopathic) with ulcer and inflammation of left lower extremity: Secondary | ICD-10-CM | POA: Diagnosis not present

## 2023-09-24 ENCOUNTER — Other Ambulatory Visit: Payer: Self-pay

## 2023-09-24 ENCOUNTER — Inpatient Hospital Stay (HOSPITAL_COMMUNITY)
Admission: EM | Admit: 2023-09-24 | Discharge: 2023-09-30 | DRG: 312 | Disposition: A | Discharge status: Skilled Nursing Facility | Payer: Medicare HMO | Attending: Internal Medicine | Admitting: Internal Medicine

## 2023-09-24 ENCOUNTER — Emergency Department (HOSPITAL_COMMUNITY): Payer: Medicare HMO

## 2023-09-24 ENCOUNTER — Other Ambulatory Visit (INDEPENDENT_AMBULATORY_CARE_PROVIDER_SITE_OTHER): Payer: Self-pay | Admitting: Internal Medicine

## 2023-09-24 ENCOUNTER — Encounter (HOSPITAL_COMMUNITY): Payer: Self-pay

## 2023-09-24 DIAGNOSIS — E1151 Type 2 diabetes mellitus with diabetic peripheral angiopathy without gangrene: Secondary | ICD-10-CM | POA: Diagnosis present

## 2023-09-24 DIAGNOSIS — Z66 Do not resuscitate: Secondary | ICD-10-CM | POA: Diagnosis not present

## 2023-09-24 DIAGNOSIS — L03116 Cellulitis of left lower limb: Secondary | ICD-10-CM | POA: Diagnosis present

## 2023-09-24 DIAGNOSIS — E1142 Type 2 diabetes mellitus with diabetic polyneuropathy: Secondary | ICD-10-CM | POA: Diagnosis present

## 2023-09-24 DIAGNOSIS — E11621 Type 2 diabetes mellitus with foot ulcer: Secondary | ICD-10-CM | POA: Diagnosis present

## 2023-09-24 DIAGNOSIS — I83023 Varicose veins of left lower extremity with ulcer of ankle: Secondary | ICD-10-CM | POA: Diagnosis present

## 2023-09-24 DIAGNOSIS — K219 Gastro-esophageal reflux disease without esophagitis: Secondary | ICD-10-CM | POA: Diagnosis present

## 2023-09-24 DIAGNOSIS — I110 Hypertensive heart disease with heart failure: Secondary | ICD-10-CM | POA: Diagnosis present

## 2023-09-24 DIAGNOSIS — M2012 Hallux valgus (acquired), left foot: Secondary | ICD-10-CM | POA: Diagnosis not present

## 2023-09-24 DIAGNOSIS — L97309 Non-pressure chronic ulcer of unspecified ankle with unspecified severity: Secondary | ICD-10-CM | POA: Diagnosis not present

## 2023-09-24 DIAGNOSIS — I2729 Other secondary pulmonary hypertension: Secondary | ICD-10-CM | POA: Diagnosis not present

## 2023-09-24 DIAGNOSIS — I5020 Unspecified systolic (congestive) heart failure: Secondary | ICD-10-CM | POA: Diagnosis not present

## 2023-09-24 DIAGNOSIS — L97529 Non-pressure chronic ulcer of other part of left foot with unspecified severity: Secondary | ICD-10-CM | POA: Diagnosis present

## 2023-09-24 DIAGNOSIS — I872 Venous insufficiency (chronic) (peripheral): Secondary | ICD-10-CM | POA: Diagnosis not present

## 2023-09-24 DIAGNOSIS — S199XXA Unspecified injury of neck, initial encounter: Secondary | ICD-10-CM | POA: Diagnosis not present

## 2023-09-24 DIAGNOSIS — Y92009 Unspecified place in unspecified non-institutional (private) residence as the place of occurrence of the external cause: Secondary | ICD-10-CM | POA: Diagnosis not present

## 2023-09-24 DIAGNOSIS — W19XXXA Unspecified fall, initial encounter: Principal | ICD-10-CM

## 2023-09-24 DIAGNOSIS — E860 Dehydration: Secondary | ICD-10-CM | POA: Diagnosis present

## 2023-09-24 DIAGNOSIS — I959 Hypotension, unspecified: Secondary | ICD-10-CM | POA: Diagnosis not present

## 2023-09-24 DIAGNOSIS — I10 Essential (primary) hypertension: Secondary | ICD-10-CM | POA: Diagnosis not present

## 2023-09-24 DIAGNOSIS — W06XXXA Fall from bed, initial encounter: Secondary | ICD-10-CM | POA: Diagnosis present

## 2023-09-24 DIAGNOSIS — E039 Hypothyroidism, unspecified: Secondary | ICD-10-CM | POA: Diagnosis present

## 2023-09-24 DIAGNOSIS — Z8249 Family history of ischemic heart disease and other diseases of the circulatory system: Secondary | ICD-10-CM

## 2023-09-24 DIAGNOSIS — I951 Orthostatic hypotension: Secondary | ICD-10-CM | POA: Diagnosis not present

## 2023-09-24 DIAGNOSIS — R55 Syncope and collapse: Secondary | ICD-10-CM | POA: Diagnosis present

## 2023-09-24 DIAGNOSIS — Z951 Presence of aortocoronary bypass graft: Secondary | ICD-10-CM

## 2023-09-24 DIAGNOSIS — I4891 Unspecified atrial fibrillation: Secondary | ICD-10-CM | POA: Diagnosis present

## 2023-09-24 DIAGNOSIS — I5022 Chronic systolic (congestive) heart failure: Secondary | ICD-10-CM | POA: Diagnosis present

## 2023-09-24 DIAGNOSIS — R17 Unspecified jaundice: Secondary | ICD-10-CM | POA: Diagnosis present

## 2023-09-24 DIAGNOSIS — F05 Delirium due to known physiological condition: Secondary | ICD-10-CM | POA: Diagnosis not present

## 2023-09-24 DIAGNOSIS — Z885 Allergy status to narcotic agent status: Secondary | ICD-10-CM

## 2023-09-24 DIAGNOSIS — D696 Thrombocytopenia, unspecified: Secondary | ICD-10-CM | POA: Diagnosis present

## 2023-09-24 DIAGNOSIS — E538 Deficiency of other specified B group vitamins: Secondary | ICD-10-CM | POA: Diagnosis present

## 2023-09-24 DIAGNOSIS — L97329 Non-pressure chronic ulcer of left ankle with unspecified severity: Secondary | ICD-10-CM | POA: Diagnosis not present

## 2023-09-24 DIAGNOSIS — Z7901 Long term (current) use of anticoagulants: Secondary | ICD-10-CM

## 2023-09-24 DIAGNOSIS — I482 Chronic atrial fibrillation, unspecified: Secondary | ICD-10-CM | POA: Diagnosis not present

## 2023-09-24 DIAGNOSIS — E785 Hyperlipidemia, unspecified: Secondary | ICD-10-CM | POA: Diagnosis present

## 2023-09-24 DIAGNOSIS — D539 Nutritional anemia, unspecified: Secondary | ICD-10-CM | POA: Diagnosis present

## 2023-09-24 DIAGNOSIS — Z85828 Personal history of other malignant neoplasm of skin: Secondary | ICD-10-CM

## 2023-09-24 DIAGNOSIS — I2510 Atherosclerotic heart disease of native coronary artery without angina pectoris: Secondary | ICD-10-CM | POA: Diagnosis present

## 2023-09-24 DIAGNOSIS — S0990XA Unspecified injury of head, initial encounter: Secondary | ICD-10-CM | POA: Diagnosis not present

## 2023-09-24 DIAGNOSIS — R0689 Other abnormalities of breathing: Secondary | ICD-10-CM | POA: Diagnosis not present

## 2023-09-24 DIAGNOSIS — R000 Tachycardia, unspecified: Secondary | ICD-10-CM | POA: Diagnosis not present

## 2023-09-24 DIAGNOSIS — L89899 Pressure ulcer of other site, unspecified stage: Secondary | ICD-10-CM | POA: Diagnosis present

## 2023-09-24 DIAGNOSIS — L539 Erythematous condition, unspecified: Secondary | ICD-10-CM | POA: Diagnosis not present

## 2023-09-24 DIAGNOSIS — I255 Ischemic cardiomyopathy: Secondary | ICD-10-CM | POA: Diagnosis present

## 2023-09-24 DIAGNOSIS — R58 Hemorrhage, not elsewhere classified: Secondary | ICD-10-CM | POA: Diagnosis not present

## 2023-09-24 DIAGNOSIS — S1191XA Laceration without foreign body of unspecified part of neck, initial encounter: Secondary | ICD-10-CM

## 2023-09-24 DIAGNOSIS — R531 Weakness: Secondary | ICD-10-CM | POA: Diagnosis not present

## 2023-09-24 DIAGNOSIS — E876 Hypokalemia: Secondary | ICD-10-CM | POA: Diagnosis present

## 2023-09-24 DIAGNOSIS — E861 Hypovolemia: Secondary | ICD-10-CM | POA: Diagnosis present

## 2023-09-24 DIAGNOSIS — Z23 Encounter for immunization: Secondary | ICD-10-CM

## 2023-09-24 DIAGNOSIS — Z7982 Long term (current) use of aspirin: Secondary | ICD-10-CM

## 2023-09-24 DIAGNOSIS — Z515 Encounter for palliative care: Secondary | ICD-10-CM

## 2023-09-24 DIAGNOSIS — M129 Arthropathy, unspecified: Secondary | ICD-10-CM | POA: Diagnosis not present

## 2023-09-24 DIAGNOSIS — I252 Old myocardial infarction: Secondary | ICD-10-CM

## 2023-09-24 DIAGNOSIS — S1181XA Laceration without foreign body of other specified part of neck, initial encounter: Secondary | ICD-10-CM | POA: Diagnosis not present

## 2023-09-24 DIAGNOSIS — I87332 Chronic venous hypertension (idiopathic) with ulcer and inflammation of left lower extremity: Secondary | ICD-10-CM

## 2023-09-24 DIAGNOSIS — Z7401 Bed confinement status: Secondary | ICD-10-CM | POA: Diagnosis not present

## 2023-09-24 DIAGNOSIS — Z79899 Other long term (current) drug therapy: Secondary | ICD-10-CM

## 2023-09-24 DIAGNOSIS — Z881 Allergy status to other antibiotic agents status: Secondary | ICD-10-CM

## 2023-09-24 DIAGNOSIS — R54 Age-related physical debility: Secondary | ICD-10-CM | POA: Diagnosis present

## 2023-09-24 DIAGNOSIS — S01412A Laceration without foreign body of left cheek and temporomandibular area, initial encounter: Secondary | ICD-10-CM | POA: Diagnosis not present

## 2023-09-24 DIAGNOSIS — Z79890 Hormone replacement therapy (postmenopausal): Secondary | ICD-10-CM

## 2023-09-24 DIAGNOSIS — R0902 Hypoxemia: Secondary | ICD-10-CM | POA: Diagnosis not present

## 2023-09-24 LAB — I-STAT CHEM 8, ED
BUN: 31 mg/dL — ABNORMAL HIGH (ref 8–23)
Calcium, Ion: 1.14 mmol/L — ABNORMAL LOW (ref 1.15–1.40)
Chloride: 95 mmol/L — ABNORMAL LOW (ref 98–111)
Creatinine, Ser: 1.1 mg/dL (ref 0.61–1.24)
Glucose, Bld: 115 mg/dL — ABNORMAL HIGH (ref 70–99)
HCT: 30 % — ABNORMAL LOW (ref 39.0–52.0)
Hemoglobin: 10.2 g/dL — ABNORMAL LOW (ref 13.0–17.0)
Potassium: 3.2 mmol/L — ABNORMAL LOW (ref 3.5–5.1)
Sodium: 139 mmol/L (ref 135–145)
TCO2: 27 mmol/L (ref 22–32)

## 2023-09-24 LAB — COMPREHENSIVE METABOLIC PANEL
ALT: 22 U/L (ref 0–44)
AST: 36 U/L (ref 15–41)
Albumin: 3.3 g/dL — ABNORMAL LOW (ref 3.5–5.0)
Alkaline Phosphatase: 51 U/L (ref 38–126)
Anion gap: 12 (ref 5–15)
BUN: 27 mg/dL — ABNORMAL HIGH (ref 8–23)
CO2: 27 mmol/L (ref 22–32)
Calcium: 9.1 mg/dL (ref 8.9–10.3)
Chloride: 97 mmol/L — ABNORMAL LOW (ref 98–111)
Creatinine, Ser: 1.21 mg/dL (ref 0.61–1.24)
GFR, Estimated: 57 mL/min — ABNORMAL LOW (ref >60)
Glucose, Bld: 118 mg/dL — ABNORMAL HIGH (ref 70–99)
Potassium: 3.2 mmol/L — ABNORMAL LOW (ref 3.5–5.1)
Sodium: 136 mmol/L (ref 135–145)
Total Bilirubin: 1.8 mg/dL — ABNORMAL HIGH (ref 0.3–1.2)
Total Protein: 6.4 g/dL — ABNORMAL LOW (ref 6.5–8.1)

## 2023-09-24 LAB — CBC
HCT: 28.2 % — ABNORMAL LOW (ref 39.0–52.0)
Hemoglobin: 9.6 g/dL — ABNORMAL LOW (ref 13.0–17.0)
MCH: 35.4 pg — ABNORMAL HIGH (ref 26.0–34.0)
MCHC: 34 g/dL (ref 30.0–36.0)
MCV: 104.1 fL — ABNORMAL HIGH (ref 80.0–100.0)
Platelets: 103 10*3/uL — ABNORMAL LOW (ref 150–400)
RBC: 2.71 MIL/uL — ABNORMAL LOW (ref 4.22–5.81)
RDW: 20.6 % — ABNORMAL HIGH (ref 11.5–15.5)
WBC: 17.5 10*3/uL — ABNORMAL HIGH (ref 4.0–10.5)
nRBC: 0 % (ref 0.0–0.2)

## 2023-09-24 LAB — MAGNESIUM: Magnesium: 2.3 mg/dL (ref 1.7–2.4)

## 2023-09-24 LAB — SAMPLE TO BLOOD BANK

## 2023-09-24 LAB — I-STAT CG4 LACTIC ACID, ED: Lactic Acid, Venous: 1.7 mmol/L (ref 0.5–1.9)

## 2023-09-24 MED ORDER — FERROUS SULFATE 325 (65 FE) MG PO TABS
325.0000 mg | ORAL_TABLET | ORAL | Status: DC
  Administered 2023-09-24 – 2023-09-28 (×2): 325 mg via ORAL
  Filled 2023-09-24 (×3): qty 1

## 2023-09-24 MED ORDER — LACTATED RINGERS IV BOLUS
500.0000 mL | Freq: Once | INTRAVENOUS | Status: AC
  Administered 2023-09-24: 500 mL via INTRAVENOUS

## 2023-09-24 MED ORDER — PANTOPRAZOLE SODIUM 40 MG PO TBEC
40.0000 mg | DELAYED_RELEASE_TABLET | Freq: Every day | ORAL | Status: DC
  Administered 2023-09-25 – 2023-09-30 (×5): 40 mg via ORAL
  Filled 2023-09-24 (×6): qty 1

## 2023-09-24 MED ORDER — ENSURE ENLIVE PO LIQD
237.0000 mL | Freq: Two times a day (BID) | ORAL | Status: DC
  Administered 2023-09-24 – 2023-09-30 (×10): 237 mL via ORAL

## 2023-09-24 MED ORDER — IBUPROFEN 800 MG PO TABS
800.0000 mg | ORAL_TABLET | Freq: Once | ORAL | Status: AC
  Administered 2023-09-24: 800 mg via ORAL
  Filled 2023-09-24: qty 1

## 2023-09-24 MED ORDER — SILVER NITRATE-POT NITRATE 75-25 % EX MISC
1.0000 | Freq: Once | CUTANEOUS | Status: DC
  Filled 2023-09-24: qty 1

## 2023-09-24 MED ORDER — MIDODRINE HCL 5 MG PO TABS
2.5000 mg | ORAL_TABLET | Freq: Two times a day (BID) | ORAL | Status: DC
  Administered 2023-09-24 – 2023-09-25 (×2): 2.5 mg via ORAL
  Filled 2023-09-24 (×2): qty 1

## 2023-09-24 MED ORDER — POTASSIUM CHLORIDE CRYS ER 20 MEQ PO TBCR
40.0000 meq | EXTENDED_RELEASE_TABLET | Freq: Once | ORAL | Status: AC
  Administered 2023-09-24: 40 meq via ORAL
  Filled 2023-09-24: qty 2

## 2023-09-24 MED ORDER — APIXABAN 2.5 MG PO TABS: 2.5000 mg | ORAL_TABLET | Freq: Two times a day (BID) | ORAL | Status: DC

## 2023-09-24 MED ORDER — ACETAMINOPHEN 500 MG PO TABS
1000.0000 mg | ORAL_TABLET | Freq: Four times a day (QID) | ORAL | Status: DC | PRN
  Administered 2023-09-24 – 2023-09-27 (×5): 1000 mg via ORAL
  Filled 2023-09-24 (×5): qty 2

## 2023-09-24 MED ORDER — SIMVASTATIN 20 MG PO TABS
40.0000 mg | ORAL_TABLET | Freq: Every day | ORAL | Status: DC
  Administered 2023-09-25 – 2023-09-27 (×3): 40 mg via ORAL
  Filled 2023-09-24 (×3): qty 2

## 2023-09-24 MED ORDER — TETANUS-DIPHTH-ACELL PERTUSSIS 5-2.5-18.5 LF-MCG/0.5 IM SUSY
0.5000 mL | PREFILLED_SYRINGE | Freq: Once | INTRAMUSCULAR | Status: AC
  Administered 2023-09-24: 0.5 mL via INTRAMUSCULAR
  Filled 2023-09-24: qty 0.5

## 2023-09-24 MED ORDER — VITAMIN B-12 1000 MCG PO TABS
1000.0000 ug | ORAL_TABLET | Freq: Every day | ORAL | Status: DC
  Administered 2023-09-25 – 2023-09-30 (×5): 1000 ug via ORAL
  Filled 2023-09-24: qty 1
  Filled 2023-09-24 (×6): qty 10

## 2023-09-24 MED ORDER — POTASSIUM CHLORIDE CRYS ER 20 MEQ PO TBCR
20.0000 meq | EXTENDED_RELEASE_TABLET | Freq: Two times a day (BID) | ORAL | Status: DC
  Administered 2023-09-25: 20 meq via ORAL
  Filled 2023-09-24: qty 1

## 2023-09-24 MED ORDER — ADULT MULTIVITAMIN W/MINERALS CH
1.0000 | ORAL_TABLET | Freq: Every day | ORAL | Status: DC
  Administered 2023-09-25 – 2023-09-30 (×5): 1 via ORAL
  Filled 2023-09-24 (×6): qty 1

## 2023-09-24 MED ORDER — LEVOTHYROXINE SODIUM 112 MCG PO TABS
112.0000 ug | ORAL_TABLET | Freq: Every day | ORAL | Status: DC
  Administered 2023-09-25 – 2023-09-30 (×6): 112 ug via ORAL
  Filled 2023-09-24 (×6): qty 1

## 2023-09-24 MED ORDER — ACETAMINOPHEN 500 MG PO TABS
1000.0000 mg | ORAL_TABLET | Freq: Once | ORAL | Status: AC
  Administered 2023-09-24: 1000 mg via ORAL
  Filled 2023-09-24: qty 2

## 2023-09-24 NOTE — Progress Notes (Addendum)
[name], [name] ([id number]) [id number] Page 1 of 8 Visit Report for 09/23/2023 Arrival Information Details Patient Name: Date of Service: [name] [name], [name] [name] 09/23/2023 3:00 PM Medical Record Number: [id number] Patient Account Number: [id number] Date of Birth/Sex: Treating RN: 1929/12/03 (89 y.o. [name] [name]) Yevonne Pax Primary Care Tamzin Bertling: Bethann Punches Other Clinician: Referring Daunte Oestreich: Treating Vonna Brabson/Extender: RO BSO N, MICHA EL Betsy Pries, Mark Weeks in Treatment: 0 Visit Information History Since Last Visit Added or deleted any medications: No Patient Arrived: Gil[place] Mor Any new allergies or adverse reactions: No Arrival Time: 15:03 Had a fall or experienced change in No Accompanied By: daughter activities of daily living that may affect Transfer Assistance: None risk of falls: Patient Identification Verified: Yes Signs or symptoms of abuse/neglect since last visito No Secondary Verification Process Completed: Yes Hospitalized since last visit: No Patient Requires Transmission-Based Precautions: No Implantable device outside of the clinic excluding No Patient Has Alerts: Yes cellular tissue based products placed in the center Patient Alerts: Patient on Blood Thinner since last visit: diabetic Has Dressing in Place as Prescribed: Yes non compressable Has Compression in Place as Prescribed: No Pain Present Now: No Electronic Signature(s) Signed: 09/24/2023 4:15:38 PM By: Yevonne Pax RN Entered By: Yevonne Pax on 09/23/2023 15:04:25 -------------------------------------------------------------------------------- Clinic Level of Care Assessment Details Patient Name: Date of Service: [name], [name] 09/23/2023 3:00 PM Medical Record Number: [id number] Patient Account Number: [id number] Date of Birth/Sex: Treating RN: 24-Apr-1929 (89 y.o. [name] [name]) Yevonne Pax Primary Care Verdelle Valtierra: Bethann Punches Other Clinician: Referring Shyloh Krinke: Treating  Nimisha Rathel/Extender: RO BSO N, MICHA EL Betsy Pries, Mark Weeks in Treatment: 0 Clinic Level of Care Assessment Items TOOL 4 Quantity Score X- 1 0 Use when only an EandM is performed on FOLLOW-UP visit ASSESSMENTS - Nursing Assessment / Reassessment X- 1 10 Reassessment of Co-morbidities (includes updates in patient status) X- 1 5 Reassessment of Adherence to Treatment Plan DAMYAN, COST ([id number]) [id number] Page 2 of 8 ASSESSMENTS - Wound and Skin A ssessment / Reassessment X - Simple Wound Assessment / Reassessment - one wound 1 5 []  - 0 Complex Wound Assessment / Reassessment - multiple wounds []  - 0 Dermatologic / Skin Assessment (not related to wound area) ASSESSMENTS - Focused Assessment []  - 0 Circumferential Edema Measurements - multi extremities []  - 0 Nutritional Assessment / Counseling / Intervention []  - 0 Lower Extremity Assessment (monofilament, tuning fork, pulses) []  - 0 Peripheral Arterial Disease Assessment (using hand held doppler) ASSESSMENTS - Ostomy and/or Continence Assessment and Care []  - 0 Incontinence Assessment and Management []  - 0 Ostomy Care Assessment and Management (repouching, etc.) PROCESS - Coordination of Care X - Simple Patient / Family Education for ongoing care 1 15 []  - 0 Complex (extensive) Patient / Family Education for ongoing care []  - 0 Staff obtains Chiropractor, Records, T Results / Process Orders est []  - 0 Staff telephones HHA, Nursing Homes / Clarify orders / etc []  - 0 Routine Transfer to another Facility (non-emergent condition) []  - 0 Routine Hospital Admission (non-emergent condition) []  - 0 New Admissions / Manufacturing engineer / Ordering NPWT Apligraf, etc. , []  - 0 Emergency Hospital Admission (emergent condition) X- 1 10 Simple Discharge Coordination []  - 0 Complex (extensive) Discharge Coordination PROCESS - Special Needs []  - 0 Pediatric / Minor Patient Management []  -  0 Isolation Patient Management []  - 0 Hearing / Language / Visual special needs []  - 0 Assessment of Community assistance (transportation, D/C planning, etc.) []  - 0 Additional assistance /  Altered mentation []  - 0 Support Surface(s) Assessment (bed, cushion, seat, etc.) INTERVENTIONS - Wound Cleansing / Measurement X - Simple Wound Cleansing - one wound 1 5 []  - 0 Complex Wound Cleansing - multiple wounds X- 1 5 Wound Imaging (photographs - any number of wounds) []  - 0 Wound Tracing (instead of photographs) X- 1 5 Simple Wound Measurement - one wound []  - 0 Complex Wound Measurement - multiple wounds INTERVENTIONS - Wound Dressings X - Small Wound Dressing one or multiple wounds 1 10 []  - 0 Medium Wound Dressing one or multiple wounds []  - 0 Large Wound Dressing one or multiple wounds []  - 0 Application of Medications - topical []  - 0 Application of Medications - injection INTERVENTIONS - Miscellaneous []  - 0 External ear exam DELLON, REVETTE ([id number]) [id number] Page 3 of 8 []  - 0 Specimen Collection (cultures, biopsies, blood, body fluids, etc.) []  - 0 Specimen(s) / Culture(s) sent or taken to Lab for analysis []  - 0 Patient Transfer (multiple staff / Michiel Sites Lift / Similar devices) []  - 0 Simple Staple / Suture removal (25 or less) []  - 0 Complex Staple / Suture removal (26 or more) []  - 0 Hypo / Hyperglycemic Management (close monitor of Blood Glucose) []  - 0 Ankle / Brachial Index (ABI) - do not check if billed separately X- 1 5 Vital Signs Has the patient been seen at the hospital within the last three years: Yes Total Score: 75 Level Of Care: New/Established - Level 2 Electronic Signature(s) Signed: 09/24/2023 4:15:38 PM By: Yevonne Pax RN Entered By: Yevonne Pax on 09/23/2023 16:46:11 -------------------------------------------------------------------------------- Lower Extremity Assessment Details Patient Name: Date  of Service: [name], [name] 09/23/2023 3:00 PM Medical Record Number: [id number] Patient Account Number: [id number] Date of Birth/Sex: Treating RN: [birth year] (89 y.o. [name] [name]) Yevonne Pax Primary Care Dalyah Pla: Bethann Punches Other Clinician: Referring Nneoma Harral: Treating Adalaya Irion/Extender: RO BSO N, MICHA EL Betsy Pries, Mark Weeks in Treatment: 0 Edema Assessment Assessed: [Left: No] [Right: No] Edema: [Left: Ye] [Right: s] Calf Left: Right: Point of Measurement: 32 cm From Medial Instep 30 cm Ankle Left: Right: Point of Measurement: 10 cm From Medial Instep 19 cm Knee To Floor Left: Right: From Medial Instep 42 cm Vascular Assessment Pulses: Dorsalis Pedis Palpable: [Left:No] Extremity colors, hair growth, and conditions: Extremity Color: [Left:Hyperpigmented] Hair Growth on Extremity: [Left:No] Temperature of Extremity: [Left:Warm] Capillary Refill: [Left:< 3 seconds] Dependent Rubor: [Left:No] Blanched when Elevated: [Left:No] KEISHUN, RAPER ([id number]) [Left:No] [Right:[id number] Page 4 of 8] Toe Nail Assessment Left: Right: Thick: Yes Discolored: Yes Deformed: Yes Improper Length and Hygiene: Yes Electronic Signature(s) Signed: 09/24/2023 4:15:38 PM By: Yevonne Pax RN Entered By: Yevonne Pax on 09/23/2023 15:09:45 -------------------------------------------------------------------------------- Multi Wound Chart Details Patient Name: Date of Service: [name] NDREW J. 09/23/2023 3:00 PM Medical Record Number: [id number] Patient Account Number: [id number] Date of Birth/Sex: Treating RN: 1929/04/27 (89 y.o. [name] [name]) Yevonne Pax Primary Care Lisaanne Lawrie: Bethann Punches Other Clinician: Referring Marica Trentham: Treating Maximum Reiland/Extender: RO BSO N, MICHA EL Betsy Pries, Mark Weeks in Treatment: 0 Vital Signs Height(in): 68 Pulse(bpm): 64 Weight(lbs): 137 Blood Pressure(mmHg): 120/61 Body Mass Index(BMI): 20.8 Temperature(F): 98.2 Respiratory  Rate(breaths/min): 18 [1:Photos: No Photos Left, Lateral Ankle Wound Location: Gradually Appeared Wounding Event: Venous Leg Ulcer Primary Etiology: Congestive Heart Failure, Coronary N/A Comorbid History: Artery Disease, Hypertension, Peripheral Venous Disease, Type II Diabetes,  Received Radiation 03/30/2023 Date Acquired: 0 Weeks of Treatment: Open Wound Status: No Wound Recurrence: 1.1x0.6x0.3 Measurements L x W x D (cm)  0.518 A (cm) : rea 0.156 Volume (cm) : 0.00% % Reduction in Area: 0.00% % Reduction in Volume: Full Thickness  Without Exposed Classification: Support Structures Medium Exudate Amount: Serosanguineous Exudate Type: red, brown Exudate Color: Medium (34-66%) Granulation Amount: Pink Granulation Quality: Medium (34-66%) Necrotic Amount: Fat Layer (Subcutaneous  Tissue): Yes N/A Exposed Structures: Fascia: No Tendon: No Muscle: No Joint: No Bone: No None Epithelialization:] [N/A:N/A N/A N/A N/A N/A N/A N/A N/A N/A N/A N/A N/A N/A N/A N/A N/A N/A N/A N/A N/A N/A] Treatment Notes Electronic Signature(s) Signed: 09/24/2023 4:15:38 PM By: Yevonne Pax RN Entered By: Yevonne Pax on 09/23/2023 15:09:56 -------------------------------------------------------------------------------- Multi-Disciplinary Care Plan Details Patient Name: Date of Service: [name] NDREW J. 09/23/2023 3:00 PM Medical Record Number: [id number] Patient Account Number: [id number] Date of Birth/Sex: Treating RN: 02-16-1929 (89 y.o. Melonie [place] Primary Care Aedan Geimer: Bethann Punches Other Clinician: Referring Brittley Regner: Treating Dazaria Macneill/Extender: RO BSO N, MICHA EL Mila Homer in Treatment: 0 Active Inactive Electronic Signature(s) Signed: 10/06/2023 10:07:20 AM By: Elliot Gurney, BSN, RN, CWS, [name] RN, BSN Signed: 11/11/2023 4:44:15 PM By: Yevonne Pax RN Previous Signature: 09/24/2023 4:15:38 PM Version By: Yevonne Pax RN Entered By: Elliot Gurney, BSN, RN, CWS, [name] on 10/06/2023  10:07:20 -------------------------------------------------------------------------------- Pain Assessment Details Patient Name: Date of Service: [name] NDREW J. 09/23/2023 3:00 PM Medical Record Number: [id number] Patient Account Number: [id number] Date of Birth/Sex: Treating RN: [birth year] (89 y.o. Melonie [place] Primary Care Andrea Colglazier: Bethann Punches Other Clinician: Referring Saysha Menta: Treating Jaymar Loeber/Extender: RO BSO N, MICHA EL Sherian Rein Weeks in Treatment: 0 Active Problems Location of Pain Severity and Description of Pain Patient Has Paino No Site Locations DATHAN, BANASZEWSKI ([id number]) [id number] Page 6 of 8 Pain Management and Medication Current Pain Management: Electronic Signature(s) Signed: 09/24/2023 4:15:38 PM By: Yevonne Pax RN Entered By: Yevonne Pax on 09/23/2023 15:04:52 -------------------------------------------------------------------------------- Patient/Caregiver Education Details Patient Name: Date of Service: Sigmund Hazel 9/25/2024andnbsp3:00 PM Medical Record Number: [id number] Patient Account Number: [id number] Date of Birth/Gender: Treating RN: 1929-01-06 (89 y.o. Melonie [place] Primary Care Physician: Bethann Punches Other Clinician: Referring Physician: Treating Physician/Extender: RO BSO N, MICHA EL Mila Homer in Treatment: 0 Education Assessment Education Provided To: Patient Education Topics Provided Wound/Skin Impairment: Handouts: Caring for Your Ulcer Methods: Explain/Verbal Responses: State content correctly Electronic Signature(s) Signed: 09/24/2023 4:15:38 PM By: Yevonne Pax RN Entered By: Yevonne Pax on 09/23/2023 15:10:29 Grayling Congress ([id number]) [id number] Page 7 of 8 -------------------------------------------------------------------------------- Wound Assessment Details Patient Name: Date of Service: SAVIEN, HRDLICKA NDREW J. 09/23/2023 3:00  PM Medical Record Number: [id number] Patient Account Number: [id number] Date of Birth/Sex: Treating RN: [birth year] (89 y.o. [name] [name]) Yevonne Pax Primary Care Alycia Cooperwood: Bethann Punches Other Clinician: Referring Terrion Poblano: Treating Maralee Higuchi/Extender: RO BSO N, MICHA EL Betsy Pries, Mark Weeks in Treatment: 0 Wound Status Wound Number: 1 Primary Venous Leg Ulcer Etiology: Wound Location: Left, Lateral Ankle Wound Open Wounding Event: Gradually Appeared Status: Date Acquired: 03/30/2023 Comorbid Congestive Heart Failure, Coronary Artery Disease, Hypertension, Weeks Of Treatment: 0 History: Peripheral Venous Disease, Type II Diabetes, Received Radiation Clustered Wound: No Wound Measurements Length: (cm) 1.1 Width: (cm) 0.6 Depth: (cm) 0.3 Area: (cm) 0.518 Volume: (cm) 0.156 % Reduction in Area: 0% % Reduction in Volume: 0% Epithelialization: None Tunneling: No Undermining: No Wound Description Classification: Full Thickness Without Exposed Suppor Exudate Amount: Medium Exudate Type: Serosanguineous Exudate Color: red, brown t Structures Foul Odor After Cleansing: No Slough/Fibrino Yes Wound Bed Granulation Amount: Medium (34-66%) Exposed  Structure Granulation Quality: Pink Fascia Exposed: No Necrotic Amount: Medium (34-66%) Fat Layer (Subcutaneous Tissue) Exposed: Yes Necrotic Quality: Adherent Slough Tendon Exposed: No Muscle Exposed: No Joint Exposed: No Bone Exposed: No Electronic Signature(s) Signed: 09/24/2023 4:15:38 PM By: Yevonne Pax RN Entered By: Yevonne Pax on 09/23/2023 15:08:10 -------------------------------------------------------------------------------- Vitals Details Patient Name: Date of Service: [name] NDREW J. 09/23/2023 3:00 PM Medical Record Number: [id number] Patient Account Number: [id number] Date of Birth/Sex: Treating RN: 04/15/1929 ([address] y.o. [name] [name]) Neel, [place] ([id number]) [phone].pdf Page 8 of 8 Primary  Care Tavin Vernet: Bethann Punches Other Clinician: Referring Meliss Fleek: Treating Vayden Weinand/Extender: RO BSO N, MICHA EL Betsy Pries, Mark Weeks in Treatment: 0 Vital Signs Time Taken: 15:00 Temperature (F): 98.2 Height (in): 68 Pulse (bpm): 64 Weight (lbs): 137 Respiratory Rate (breaths/min): 18 Body Mass Index (BMI): 20.8 Blood Pressure (mmHg): 120/61 Reference Range: 80 - 120 mg / dl Electronic Signature(s) Signed: 09/24/2023 4:15:38 PM By: Yevonne Pax RN Entered By: Yevonne Pax on 09/23/2023 15:04:41

## 2023-09-24 NOTE — ED Notes (Signed)
Patient transported to CT 

## 2023-09-24 NOTE — Progress Notes (Addendum)
Alert and Oriented x 3. pleasant and cooperative. Notes Upon inspection patient's wound bed actually showed signs of doing decently well all things considered. No sharp debridement was actually necessary today as this appears to be doing really well as far as how clean it looks. Subsequently we are going to see about however initiating compression therapy which I think should be really good for the patient. Electronic Signature(s) Signed: 09/25/2023 2:12:44 PM By: Allen Derry PA-C Entered By: Allen Derry on 09/25/2023 11:12:44 -------------------------------------------------------------------------------- Physician Orders Details Patient Name: Date of Service: [name] NDREW J. 09/22/2023 9:30 A M Medical Record Number: [id number] Patient Account Number: [id number] Date of Birth/Sex: Treating RN: 08/03/1929 ([address] y.o. [name]) Randolf, Sansoucie ([id number]) [id number] Page 3 of 8 Primary Care Provider: Bethann Punches Other Clinician: Referring Provider: Treating Provider/Extender: Margorie John in Treatment: 0 Verbal / Phone Orders: No Diagnosis Coding Follow-up Appointments Return Appointment in 1 week. Home Health Home Health Company: - [facility] for wound care. May utilize formulary equivalent dressing for wound treatment orders unless otherwise specified. Home Health Nurse may visit PRN to address patients wound care needs. Frances Furbish [phone] Bathing/ Shower/ Hygiene May  shower with wound dressing protected with water repellent cover or cast protector. Anesthetic (Use 'Patient Medications' Section for Anesthetic Order Entry) Lidocaine applied to wound bed Edema Control - Lymphedema / Segmental Compressive Device / Other Elevate, Exercise Daily and A void Standing for Long Periods of Time. Elevate legs to the level of the heart and pump ankles as often as possible Elevate leg(s) parallel to the floor when sitting. Wound Treatment Wound #1 - Ankle Wound Laterality: Left, Lateral Cleanser: Wound Cleanser 1 x Per Week/15 Days Discharge Instructions: Wash your hands with soap and water. Remove old dressing, discard into plastic bag and place into trash. Cleanse the wound with Wound Cleanser prior to applying a clean dressing using gauze sponges, not tissues or cotton balls. Do not scrub or use excessive force. Pat dry using gauze sponges, not tissue or cotton balls. Prim Dressing: IODOFLEX 0.9% Cadexomer Iodine Pad 1 x Per Week/15 Days ary Discharge Instructions: Apply Iodoflex to wound bed only as directed. Secondary Dressing: Gauze 1 x Per Week/15 Days Compression Wrap: Urgo K2 Lite, two layer compression system, regular 1 x Per Week/15 Days Electronic Signature(s) Signed: 09/22/2023 4:57:42 PM By: Allen Derry PA-C Signed: 09/24/2023 4:15:38 PM By: Yevonne Pax RN Entered By: Yevonne Pax on 09/22/2023 08:28:04 -------------------------------------------------------------------------------- Problem List Details Patient Name: Date of Service: [name] NDREW J. 09/22/2023 9:30 A M Medical Record Number: [id number] Patient Account Number: [id number] Date of Birth/Sex: Treating RN: 28-Jul-1929 (89 y.o. Melonie [place] Primary Care Provider: Bethann Punches Other Clinician: Referring Provider: Treating Provider/Extender: Margorie John in Treatment: 0 Active Problems ICD-10 Encounter Code Description Active Date MDM Diagnosis I87.332 Chronic  venous hypertension (idiopathic) with ulcer and inflammation of left 09/22/2023 No Yes KHIEM, GARGIS ([id number]) [phone].pdf Page 4 of 8 lower extremity L97.322 Non-pressure chronic ulcer of left ankle with fat layer exposed 09/22/2023 No Yes E11.621 Type 2 diabetes mellitus with foot ulcer 09/22/2023 No Yes I10 Essential (primary) hypertension 09/22/2023 No Yes I48.0 Paroxysmal atrial fibrillation 09/22/2023 No Yes Z79.01 Long term (current) use of anticoagulants 09/22/2023 No Yes I50.42 Chronic combined systolic (congestive) and diastolic (congestive) heart failure 09/22/2023 No Yes I25.10 Atherosclerotic heart disease of native coronary artery without angina pectoris 09/22/2023 No Yes I73.89 Other specified peripheral vascular diseases 09/22/2023 No Yes  MORTY, ORTWEIN ([id number]) [id number] Page 1 of 8 Visit Report for 09/22/2023 Chief Complaint Document Details Patient Name: Date of Service: DERICO, MITTON 09/22/2023 9:30 A M Medical Record Number: [id number] Patient Account Number: [id number] Date of Birth/Sex: Treating RN: 25-Sep-1929 (89 y.o. [name]) Yevonne Pax Primary Care Provider: Bethann Punches Other Clinician: Referring Provider: Treating Provider/Extender: Margorie John in Treatment: 0 Information Obtained from: Patient Chief Complaint Left ankle ulcer Electronic Signature(s) Signed: 09/22/2023 10:50:28 AM By: Allen Derry PA-C Entered By: Allen Derry on 09/22/2023 07:50:28 -------------------------------------------------------------------------------- HPI Details Patient Name: Date of Service: [name] NDREW J. 09/22/2023 9:30 A M Medical Record Number: [id number] Patient Account Number: [id number] Date of Birth/Sex: Treating RN: 1929-11-30 (89 y.o. [name]) Yevonne Pax Primary Care Provider: Bethann Punches Other Clinician: Referring Provider: Treating Provider/Extender: Margorie John in Treatment: 0 History of Present Illness Chronic/Inactive Conditions Condition 1: 09-22-2023 patient's ABI was performed formally on 01-02-2022 and showed to be noncompressible with a TBI of the right of 0.68 and on the left of 0.76. In the office today he was noncompressible. HPI Description: 09-22-2023 upon evaluation today patient appears to be doing slightly poorly in regard to left lateral ankle ulcer. This was actually acquired a started on around March 30, 2023. Subsequently this is something that appears to be related more to a venous situation with swelling of the lower extremity. The patient has had ABIs performed formally January 02, 2022 which were noncompressible the screening today was also noncompressible. The previous TBI as noted above however was good at 0.68 on the  right and 0.76 on the left. The patient has had stent placement. Currently home health is involved in the care and they will use the silver alginate with an Radio broadcast assistant. This all started when the area just got sore and then gradually opened. Patient has a past medical history which is significant for peripheral vascular disease, coronary artery disease, congestive heart failure, long-term use of anticoagulants secondary to atrial fibrillation, hypertension, diabetes mellitus type 2, and chronic venous insufficiency. Electronic Signature(s) Signed: 09/25/2023 2:11:39 PM By: Allen Derry PA-C Previous Signature: 09/22/2023 4:50:43 PM Version By: Hansel Feinstein ([id number]) [id number] Page 2 of 8 Previous Signature: 09/22/2023 4:50:43 PM Version By: Allen Derry PA-C Previous Signature: 09/22/2023 3:02:20 PM Version By: Allen Derry PA-C Entered By: Allen Derry on 09/25/2023 11:11:38 -------------------------------------------------------------------------------- Physical Exam Details Patient Name: Date of Service: QUIRINO, KAKOS NDREW J. 09/22/2023 9:30 A M Medical Record Number: [id number] Patient Account Number: [id number] Date of Birth/Sex: Treating RN: 11/05/1929 (89 y.o. Melonie [place] Primary Care Provider: Bethann Punches Other Clinician: Referring Provider: Treating Provider/Extender: Margorie John in Treatment: 0 Constitutional sitting or standing blood pressure is within target range for patient.. pulse regular and within target range for patient.Marland Kitchen respirations regular, non-labored and within target range for patient.Marland Kitchen temperature within target range for patient.. Well-nourished and well-hydrated in no acute distress. Eyes conjunctiva clear no eyelid edema noted. pupils equal round and reactive to light and accommodation. Ears, Nose, Mouth, and Throat no gross abnormality of ear auricles or external auditory canals. normal hearing  noted during conversation. mucus membranes moist. Respiratory normal breathing without difficulty. Cardiovascular 1+ dorsalis pedis/posterior tibialis pulses. 1+ pitting edema of the bilateral lower extremities. Musculoskeletal normal gait and posture. no significant deformity or arthritic changes, no loss or range of motion, no clubbing. Psychiatric this patient is able to make decisions and demonstrates good insight into disease process.  Inactive Problems Resolved Problems Electronic Signature(s) Signed: 09/22/2023 10:50:17 AM By: Allen Derry PA-C Entered By: Allen Derry on 09/22/2023 07:50:17 -------------------------------------------------------------------------------- Progress Note Details Patient Name: Date of Service: [name] NDREW J. 09/22/2023 9:30 A M Medical Record Number: [id number] Patient Account Number: [id number] Date of Birth/Sex: Treating RN: Nov 27, 1929 (89 y.o. Melonie [place] Primary Care Provider: Bethann Punches Other Clinician: Referring Provider: Treating Provider/Extender: Margorie John in Treatment: 0 Subjective Chief Complaint Information obtained from Patient Left ankle ulcer History of Present Illness (HPI) SYLVIA, KONDRACKI ([id number]) [phone].pdf Page 5 of 8 Chronic/Inactive Condition: 09-22-2023 patient's ABI was performed formally on 01-02-2022 and showed to be noncompressible with a TBI of the right of 0.68 and on the left of 0.76. In the office today he was noncompressible. 09-22-2023 upon evaluation today patient appears to be doing slightly poorly in regard to left  lateral ankle ulcer. This was actually acquired a started on around March 30, 2023. Subsequently this is something that appears to be related more to a venous situation with swelling of the lower extremity. The patient has had ABIs performed formally January 02, 2022 which were noncompressible the screening today was also noncompressible. The previous TBI as noted above however was good at 0.68 on the right and 0.76 on the left. The patient has had stent placement. Currently home health is involved in the care and they will use the silver alginate with an Radio broadcast assistant. This all started when the area just got sore and then gradually opened. Patient has a past medical history which is significant for peripheral vascular disease, coronary artery disease, congestive heart failure, long-term use of anticoagulants secondary to atrial fibrillation, hypertension, diabetes mellitus type 2, and chronic venous insufficiency. Patient History Information obtained from Patient, Chart, Other: daughter. Allergies cephalexin, spironolactone, codeine, doxycycline Social History Never smoker, Marital Status - Widowed, Alcohol Use - Never, Drug Use - No History, Caffeine Use - Never. Medical History Cardiovascular Patient has history of Congestive Heart Failure, Coronary Artery Disease, Hypertension, Peripheral Venous Disease Denies history of Peripheral Arterial Disease Endocrine Patient has history of Type II Diabetes Oncologic Patient has history of Received Radiation - 2024 Patient is treated with Controlled Diet. Review of Systems (ROS) Constitutional Symptoms (General Health) Denies complaints or symptoms of Fatigue, Fever, Chills, Marked Weight Change. Eyes Complains or has symptoms of Glasses / Contacts. Ear/Nose/Mouth/Throat Denies complaints or symptoms of Difficult clearing ears, Sinusitis. Hematologic/Lymphatic Denies complaints or symptoms of Bleeding / Clotting Disorders, Human Immunodeficiency  Virus, on Eliquis for A fib Integumentary (Skin) Complains or has symptoms of Wounds, Swelling. Objective Constitutional sitting or standing blood pressure is within target range for patient.. pulse regular and within target range for patient.Marland Kitchen respirations regular, non-labored and within target range for patient.Marland Kitchen temperature within target range for patient.. Well-nourished and well-hydrated in no acute distress. Vitals Time Taken: 9:52 AM, Height: 68 in, Source: Stated, Weight: 137 lbs, Source: Stated, BMI: 20.8, Temperature: 97.6 F, Pulse: 74 bpm, Respiratory Rate: 18 breaths/min, Blood Pressure: 112/52 mmHg. Eyes conjunctiva clear no eyelid edema noted. pupils equal round and reactive to light and accommodation. Ears, Nose, Mouth, and Throat no gross abnormality of ear auricles or external auditory canals. normal hearing noted during conversation. mucus membranes moist. Respiratory normal breathing without difficulty. Cardiovascular 1+ dorsalis pedis/posterior tibialis pulses. 1+ pitting edema of the bilateral lower extremities. Musculoskeletal normal gait and posture. no significant deformity or arthritic changes, no loss or range of motion, no clubbing. Psychiatric this patient is  Inactive Problems Resolved Problems Electronic Signature(s) Signed: 09/22/2023 10:50:17 AM By: Allen Derry PA-C Entered By: Allen Derry on 09/22/2023 07:50:17 -------------------------------------------------------------------------------- Progress Note Details Patient Name: Date of Service: [name] NDREW J. 09/22/2023 9:30 A M Medical Record Number: [id number] Patient Account Number: [id number] Date of Birth/Sex: Treating RN: Nov 27, 1929 (89 y.o. Melonie [place] Primary Care Provider: Bethann Punches Other Clinician: Referring Provider: Treating Provider/Extender: Margorie John in Treatment: 0 Subjective Chief Complaint Information obtained from Patient Left ankle ulcer History of Present Illness (HPI) SYLVIA, KONDRACKI ([id number]) [phone].pdf Page 5 of 8 Chronic/Inactive Condition: 09-22-2023 patient's ABI was performed formally on 01-02-2022 and showed to be noncompressible with a TBI of the right of 0.68 and on the left of 0.76. In the office today he was noncompressible. 09-22-2023 upon evaluation today patient appears to be doing slightly poorly in regard to left  lateral ankle ulcer. This was actually acquired a started on around March 30, 2023. Subsequently this is something that appears to be related more to a venous situation with swelling of the lower extremity. The patient has had ABIs performed formally January 02, 2022 which were noncompressible the screening today was also noncompressible. The previous TBI as noted above however was good at 0.68 on the right and 0.76 on the left. The patient has had stent placement. Currently home health is involved in the care and they will use the silver alginate with an Radio broadcast assistant. This all started when the area just got sore and then gradually opened. Patient has a past medical history which is significant for peripheral vascular disease, coronary artery disease, congestive heart failure, long-term use of anticoagulants secondary to atrial fibrillation, hypertension, diabetes mellitus type 2, and chronic venous insufficiency. Patient History Information obtained from Patient, Chart, Other: daughter. Allergies cephalexin, spironolactone, codeine, doxycycline Social History Never smoker, Marital Status - Widowed, Alcohol Use - Never, Drug Use - No History, Caffeine Use - Never. Medical History Cardiovascular Patient has history of Congestive Heart Failure, Coronary Artery Disease, Hypertension, Peripheral Venous Disease Denies history of Peripheral Arterial Disease Endocrine Patient has history of Type II Diabetes Oncologic Patient has history of Received Radiation - 2024 Patient is treated with Controlled Diet. Review of Systems (ROS) Constitutional Symptoms (General Health) Denies complaints or symptoms of Fatigue, Fever, Chills, Marked Weight Change. Eyes Complains or has symptoms of Glasses / Contacts. Ear/Nose/Mouth/Throat Denies complaints or symptoms of Difficult clearing ears, Sinusitis. Hematologic/Lymphatic Denies complaints or symptoms of Bleeding / Clotting Disorders, Human Immunodeficiency  Virus, on Eliquis for A fib Integumentary (Skin) Complains or has symptoms of Wounds, Swelling. Objective Constitutional sitting or standing blood pressure is within target range for patient.. pulse regular and within target range for patient.Marland Kitchen respirations regular, non-labored and within target range for patient.Marland Kitchen temperature within target range for patient.. Well-nourished and well-hydrated in no acute distress. Vitals Time Taken: 9:52 AM, Height: 68 in, Source: Stated, Weight: 137 lbs, Source: Stated, BMI: 20.8, Temperature: 97.6 F, Pulse: 74 bpm, Respiratory Rate: 18 breaths/min, Blood Pressure: 112/52 mmHg. Eyes conjunctiva clear no eyelid edema noted. pupils equal round and reactive to light and accommodation. Ears, Nose, Mouth, and Throat no gross abnormality of ear auricles or external auditory canals. normal hearing noted during conversation. mucus membranes moist. Respiratory normal breathing without difficulty. Cardiovascular 1+ dorsalis pedis/posterior tibialis pulses. 1+ pitting edema of the bilateral lower extremities. Musculoskeletal normal gait and posture. no significant deformity or arthritic changes, no loss or range of motion, no clubbing. Psychiatric this patient is  Inactive Problems Resolved Problems Electronic Signature(s) Signed: 09/22/2023 10:50:17 AM By: Allen Derry PA-C Entered By: Allen Derry on 09/22/2023 07:50:17 -------------------------------------------------------------------------------- Progress Note Details Patient Name: Date of Service: [name] NDREW J. 09/22/2023 9:30 A M Medical Record Number: [id number] Patient Account Number: [id number] Date of Birth/Sex: Treating RN: Nov 27, 1929 (89 y.o. Melonie [place] Primary Care Provider: Bethann Punches Other Clinician: Referring Provider: Treating Provider/Extender: Margorie John in Treatment: 0 Subjective Chief Complaint Information obtained from Patient Left ankle ulcer History of Present Illness (HPI) SYLVIA, KONDRACKI ([id number]) [phone].pdf Page 5 of 8 Chronic/Inactive Condition: 09-22-2023 patient's ABI was performed formally on 01-02-2022 and showed to be noncompressible with a TBI of the right of 0.68 and on the left of 0.76. In the office today he was noncompressible. 09-22-2023 upon evaluation today patient appears to be doing slightly poorly in regard to left  lateral ankle ulcer. This was actually acquired a started on around March 30, 2023. Subsequently this is something that appears to be related more to a venous situation with swelling of the lower extremity. The patient has had ABIs performed formally January 02, 2022 which were noncompressible the screening today was also noncompressible. The previous TBI as noted above however was good at 0.68 on the right and 0.76 on the left. The patient has had stent placement. Currently home health is involved in the care and they will use the silver alginate with an Radio broadcast assistant. This all started when the area just got sore and then gradually opened. Patient has a past medical history which is significant for peripheral vascular disease, coronary artery disease, congestive heart failure, long-term use of anticoagulants secondary to atrial fibrillation, hypertension, diabetes mellitus type 2, and chronic venous insufficiency. Patient History Information obtained from Patient, Chart, Other: daughter. Allergies cephalexin, spironolactone, codeine, doxycycline Social History Never smoker, Marital Status - Widowed, Alcohol Use - Never, Drug Use - No History, Caffeine Use - Never. Medical History Cardiovascular Patient has history of Congestive Heart Failure, Coronary Artery Disease, Hypertension, Peripheral Venous Disease Denies history of Peripheral Arterial Disease Endocrine Patient has history of Type II Diabetes Oncologic Patient has history of Received Radiation - 2024 Patient is treated with Controlled Diet. Review of Systems (ROS) Constitutional Symptoms (General Health) Denies complaints or symptoms of Fatigue, Fever, Chills, Marked Weight Change. Eyes Complains or has symptoms of Glasses / Contacts. Ear/Nose/Mouth/Throat Denies complaints or symptoms of Difficult clearing ears, Sinusitis. Hematologic/Lymphatic Denies complaints or symptoms of Bleeding / Clotting Disorders, Human Immunodeficiency  Virus, on Eliquis for A fib Integumentary (Skin) Complains or has symptoms of Wounds, Swelling. Objective Constitutional sitting or standing blood pressure is within target range for patient.. pulse regular and within target range for patient.Marland Kitchen respirations regular, non-labored and within target range for patient.Marland Kitchen temperature within target range for patient.. Well-nourished and well-hydrated in no acute distress. Vitals Time Taken: 9:52 AM, Height: 68 in, Source: Stated, Weight: 137 lbs, Source: Stated, BMI: 20.8, Temperature: 97.6 F, Pulse: 74 bpm, Respiratory Rate: 18 breaths/min, Blood Pressure: 112/52 mmHg. Eyes conjunctiva clear no eyelid edema noted. pupils equal round and reactive to light and accommodation. Ears, Nose, Mouth, and Throat no gross abnormality of ear auricles or external auditory canals. normal hearing noted during conversation. mucus membranes moist. Respiratory normal breathing without difficulty. Cardiovascular 1+ dorsalis pedis/posterior tibialis pulses. 1+ pitting edema of the bilateral lower extremities. Musculoskeletal normal gait and posture. no significant deformity or arthritic changes, no loss or range of motion, no clubbing. Psychiatric this patient is

## 2023-09-24 NOTE — ED Provider Notes (Signed)
..  Laceration Repair  Date/Time: 09/24/2023 12:35 PM  Performed by: Dartha Lodge, PA-C Authorized by: Dartha Lodge, PA-C   Consent:    Consent obtained:  Verbal   Consent given by:  Patient   Risks, benefits, and alternatives were discussed: yes     Risks discussed:  Infection, need for additional repair, poor cosmetic result and pain Universal protocol:    Procedure explained and questions answered to patient or proxy's satisfaction: yes     Patient identity confirmed:  Verbally with patient Anesthesia:    Anesthesia method:  None Laceration details:    Location:  Face   Face location:  L cheek   Length (cm):  3 Pre-procedure details:    Preparation:  Patient was prepped and draped in usual sterile fashion Exploration:    Wound extent: areolar tissue violated   Treatment:    Area cleansed with:  Shur-Clens   Amount of cleaning:  Extensive   Irrigation solution:  Sterile saline   Debridement:  Minimal   Undermining:  None Skin repair:    Repair method:  Steri-Strips   Number of Steri-Strips:  2 Approximation:    Approximation:  Close Repair type:    Repair type:  Simple Post-procedure details:    Dressing:  Open (no dressing)   Procedure completion:  Tolerated well, no immediate complications .Marland KitchenLaceration Repair  Date/Time: 09/24/2023 12:38 PM  Performed by: Dartha Lodge, PA-C Authorized by: Dartha Lodge, PA-C   Consent:    Consent obtained:  Verbal   Consent given by:  Patient   Risks, benefits, and alternatives were discussed: yes     Risks discussed:  Infection, need for additional repair, pain and poor cosmetic result   Alternatives discussed:  No treatment Universal protocol:    Procedure explained and questions answered to patient or proxy's satisfaction: yes     Patient identity confirmed:  Verbally with patient Anesthesia:    Anesthesia method:  None Laceration details:    Location:  Face   Facial location: left temple.   Length (cm):   2 Pre-procedure details:    Preparation:  Patient was prepped and draped in usual sterile fashion Exploration:    Hemostasis achieved with:  Direct pressure   Wound exploration: entire depth of wound visualized     Wound extent: areolar tissue violated   Treatment:    Area cleansed with:  Shur-Clens   Amount of cleaning:  Extensive   Irrigation solution:  Sterile saline Skin repair:    Repair method:  Steri-Strips   Number of Steri-Strips:  2 Approximation:    Approximation:  Close Repair type:    Repair type:  Simple Post-procedure details:    Dressing:  Open (no dressing)   Procedure completion:  Tolerated well, no immediate complications     Dartha Lodge, PA-C 09/24/23 1249    Melene Plan, DO 09/24/23 1256

## 2023-09-24 NOTE — ED Notes (Signed)
ED TO INPATIENT HANDOFF REPORT  ED Nurse Name and Phone #: [phone], Tori   S Name/Age/Gender [name] [name] 89 y.o. male Room/Bed: 016C/016C  Code Status   Code Status: Do not attempt resuscitation (DNR) - Comfort care  Home/SNF/Other Home Patient oriented to: self, place, time, and situation Is this baseline? Yes   Triage Complete: Triage complete  Chief Complaint Fall at home [phone], Y92.009]  Triage Note Pt BIB EMS due to a fall on thinners. Pt is on elliquis. Pt fell at home this morning, pts life alert notified family. Pt hit head. 6in avulsion to left side of neck. Bleeding controlled. Axox4. VSS.    Allergies Allergies  Allergen Reactions   Cephalexin Rash   Spironolactone Nausea Only   Codeine Rash and Other (See Comments)    Can not sleep   Doxycycline Rash    Level of Care/Admitting Diagnosis ED Disposition     ED Disposition  Admit   Condition  --   Comment  Hospital Area: [facility] [[id number]]  Level of Care: Telemetry Medical [[id number]]  May admit patient to [facility] or [facility] if equivalent level of care is available:: No  Covid Evaluation: Asymptomatic - no recent exposure (last 10 days) testing not required  Diagnosis: Fall at home [[id number]]  Admitting Physician: Inez Catalina [[id number]]  Attending Physician: Inez Catalina [phone]  Certification:: I certify this patient will need inpatient services for at least 2 midnights  Expected Medical Readiness: 09/26/2023          B Medical/Surgery History Past Medical History:  Diagnosis Date   A-fib [facility])    a.) CHA2DS2VASc = 5 (age x 2, CHF, HTN, vascular disease history);  b.) rate/rhythm maintained on oral metoprolol succinate chronically anticoagulated with apixaban   Aortic atherosclerosis (HCC)    B12 deficiency    CHF (congestive heart failure) (HCC)    a.) TTE 11/05/2012: EF 25-35%, LV apical thrombus, distal sep AK, mild MR; b.) TTE 05/10/2014: EF 30%, mild LVH, mod  BAE, mild AR/PR, mod MR/TR; c.) TTE 03/12/2020: EF 20%, sev glob HK, mod BAE, mild LAE, mod RAE, triv PR, mild AR, sev MR/TR; d.) TTE 08/21/2021: EF 30%, sev glob HK, apical dyskinesis, mild LVH, mod BAE, triv AR/PR, mod MR, sev TR   Cor pulmonale (HCC)    Coronary artery disease    a.) s/p 2v CABG 02/26/1993   Family history of adverse reaction to anesthesia    a.) PONV in 1st degree relative (daughter)   GERD (gastroesophageal reflux disease)    History of [place] spotted fever    HLD (hyperlipidemia)    HTN (hypertension), benign 04/25/2014   Hypothyroidism    Iron deficiency anemia    Long term current use of anticoagulant    a.) apixaban   Posterolateral myocardial infarction [facility])    Pulmonary HTN (HCC)    a.) TTE 05/10/2014: RVSP 48.6; b.) TTE 03/12/2020: RVSP 73.3   PVD (peripheral vascular disease) (HCC)    S/P CABG x 2 02/26/1993   a.) LIMA-LAD, SVG-OM1   Squamous cell carcinoma of skin 11/18/2021   right ear and mandible, [facility]   Squamous cell carcinoma of skin 03/18/2022   right mandible and ear/refer for Mohs/ Dr. Adriana Simas has referred pt to Dr. Nedra Hai in otolaryngology excised by Dr. Nedra Hai 05/03/22, Radiation with Dr. Rushie Chestnut   Type 2 diabetes mellitus with peripheral angiopathy (HCC) 11/10/2018   Past Surgical History:  Procedure Laterality Date  cancer removal   2023   below the right base of ear at [facility]   COLONOSCOPY     CORONARY ARTERY BYPASS GRAFT N/A 02/26/1993   Procedure: CORONARY ARTERY BYPASS GRAFT; Location: [facility]; Surgeon: Marshell Garfinkel, MD   HERNIA REPAIR     LOWER EXTREMITY ANGIOGRAPHY Left 08/30/2021   Procedure: LOWER EXTREMITY ANGIOGRAPHY;  Surgeon: Renford Dills, MD;  Location: [facility] INVASIVE CV LAB;  Service: Cardiovascular;  Laterality: Left;     A IV Location/Drains/Wounds Patient Lines/Drains/Airways Status     Active Line/Drains/Airways     Name Placement date Placement time Site Days   Peripheral IV 09/24/23 18 G Anterior;Left Hand  09/24/23  1007  Hand  less than 1   Peripheral IV 09/24/23 20 G Right Antecubital 09/24/23  1008  Antecubital  less than 1   Wound / Incision (Open or Dehisced) 08/30/21 Pretibial Distal;Left 08/30/21  2000  Pretibial  755   Wound / Incision (Open or Dehisced) 08/30/21 Elbow Left;Posterior 08/30/21  2000  Elbow  755            Intake/Output Last 24 hours No intake or output data in the 24 hours ending 09/24/23 1308  Labs/Imaging Results for orders placed or performed during the hospital encounter of 09/24/23 (from the past 48 hour(s))  Sample to Blood Bank     Status: None   Collection Time: 09/24/23 10:00 AM  Result Value Ref Range   Blood Bank Specimen SAMPLE AVAILABLE FOR TESTING    Sample Expiration      09/27/2023,2359 Performed at [facility], [address] [place], [place] [address]   Comprehensive metabolic panel     Status: Abnormal   Collection Time: 09/24/23 10:06 AM  Result Value Ref Range   Sodium 136 135 - 145 mmol/L   Potassium 3.2 (L) 3.5 - 5.1 mmol/L   Chloride 97 (L) 98 - 111 mmol/L   CO2 27 22 - 32 mmol/L   Glucose, Bld 118 (H) 70 - 99 mg/dL    Comment: Glucose reference range applies only to samples taken after fasting for at least 8 hours.   BUN 27 (H) 8 - 23 mg/dL   Creatinine, Ser 0.86 0.61 - 1.24 mg/dL   Calcium 9.1 8.9 - 57.8 mg/dL   Total Protein 6.4 (L) 6.5 - 8.1 g/dL   Albumin 3.3 (L) 3.5 - 5.0 g/dL   AST 36 15 - 41 U/L   ALT 22 0 - 44 U/L   Alkaline Phosphatase 51 38 - 126 U/L   Total Bilirubin 1.8 (H) 0.3 - 1.2 mg/dL   GFR, Estimated 57 (L) >60 mL/min    Comment: (NOTE) Calculated using the CKD-EPI Creatinine Equation (2021)    Anion gap 12 5 - 15    Comment: Performed at [facility], [address], [name], [place] [address]  CBC     Status: Abnormal   Collection Time: 09/24/23 10:06 AM  Result Value Ref Range   WBC 17.5 (H) 4.0 - 10.5 K/uL   RBC 2.71 (L) 4.22 - 5.81 MIL/uL   Hemoglobin 9.6 (L) 13.0 - 17.0 g/dL    HCT 95.2 (L) 84.1 - 52.0 %   MCV 104.1 (H) 80.0 - 100.0 fL   MCH 35.4 (H) 26.0 - 34.0 pg   MCHC 34.0 30.0 - 36.0 g/dL   RDW 32.4 (H) 40.1 - 02.7 %   Platelets 103 (L) 150 - 400 K/uL   nRBC 0.0 0.0 - 0.2 %  Comment: Performed at [facility], [address] [place], [place] [address]  I-Stat Chem 8, ED     Status: Abnormal   Collection Time: 09/24/23 10:19 AM  Result Value Ref Range   Sodium 139 135 - 145 mmol/L   Potassium 3.2 (L) 3.5 - 5.1 mmol/L   Chloride 95 (L) 98 - 111 mmol/L   BUN 31 (H) 8 - 23 mg/dL   Creatinine, Ser 4.09 0.61 - 1.24 mg/dL   Glucose, Bld 811 (H) 70 - 99 mg/dL    Comment: Glucose reference range applies only to samples taken after fasting for at least 8 hours.   Calcium, Ion 1.14 (L) 1.15 - 1.40 mmol/L   TCO2 27 22 - 32 mmol/L   Hemoglobin 10.2 (L) 13.0 - 17.0 g/dL   HCT 91.4 (L) 78.2 - 95.6 %  I-Stat Lactic Acid, ED     Status: None   Collection Time: 09/24/23 10:19 AM  Result Value Ref Range   Lactic Acid, Venous 1.7 0.5 - 1.9 mmol/L   CT HEAD WO CONTRAST  Result Date: 09/24/2023 CLINICAL DATA:  Head trauma, moderate-severe; Polytrauma, blunt. EXAM: CT HEAD WITHOUT CONTRAST CT CERVICAL SPINE WITHOUT CONTRAST TECHNIQUE: Multidetector CT imaging of the head and cervical spine was performed following the standard protocol without intravenous contrast. Multiplanar CT image reconstructions of the cervical spine were also generated. RADIATION DOSE REDUCTION: This exam was performed according to the departmental dose-optimization program which includes automated exposure control, adjustment of the mA and/or kV according to patient size and/or use of iterative reconstruction technique. COMPARISON:  02/20/2023 FINDINGS: CT HEAD FINDINGS Brain: Similar low-density subdural fluid collections along the frontal convexities compatible with chronic hygromas or subdural hematomas, stable. No acute hemorrhage. No hydrocephalus or acute infarct. There is atrophy and  chronic small vessel disease changes. Vascular: No hyperdense vessel or unexpected calcification. Skull: No acute calvarial abnormality. Sinuses/Orbits: No acute findings Other: None CT CERVICAL SPINE FINDINGS Alignment: Normal Skull base and vertebrae: No acute fracture. No primary bone lesion or focal pathologic process. Soft tissues and spinal canal: No prevertebral fluid or swelling. No visible canal hematoma. Disc levels: No disc herniation. Mild degenerative facet disease bilaterally. Upper chest: No acute findings Other: None IMPRESSION: Stable chronic subdural fluid collections over the frontal convexities most compatible with chronic hygromas or subdural hematomas, stable. No acute bony abnormality in the cervical spine. Electronically Signed   By: Charlett Nose M.D.   On: 09/24/2023 11:22   CT CERVICAL SPINE WO CONTRAST  Result Date: 09/24/2023 CLINICAL DATA:  Head trauma, moderate-severe; Polytrauma, blunt. EXAM: CT HEAD WITHOUT CONTRAST CT CERVICAL SPINE WITHOUT CONTRAST TECHNIQUE: Multidetector CT imaging of the head and cervical spine was performed following the standard protocol without intravenous contrast. Multiplanar CT image reconstructions of the cervical spine were also generated. RADIATION DOSE REDUCTION: This exam was performed according to the departmental dose-optimization program which includes automated exposure control, adjustment of the mA and/or kV according to patient size and/or use of iterative reconstruction technique. COMPARISON:  02/20/2023 FINDINGS: CT HEAD FINDINGS Brain: Similar low-density subdural fluid collections along the frontal convexities compatible with chronic hygromas or subdural hematomas, stable. No acute hemorrhage. No hydrocephalus or acute infarct. There is atrophy and chronic small vessel disease changes. Vascular: No hyperdense vessel or unexpected calcification. Skull: No acute calvarial abnormality. Sinuses/Orbits: No acute findings Other: None CT  CERVICAL SPINE FINDINGS Alignment: Normal Skull base and vertebrae: No acute fracture. No primary bone lesion or focal pathologic process.  Soft tissues and spinal canal: No prevertebral fluid or swelling. No visible canal hematoma. Disc levels: No disc herniation. Mild degenerative facet disease bilaterally. Upper chest: No acute findings Other: None IMPRESSION: Stable chronic subdural fluid collections over the frontal convexities most compatible with chronic hygromas or subdural hematomas, stable. No acute bony abnormality in the cervical spine. Electronically Signed   By: Charlett Nose M.D.   On: 09/24/2023 11:22   US Venous Img Lower Unilateral Left (DVT)  Result Date: 09/23/2023 CLINICAL DATA:  Left calf pain EXAM: LEFT LOWER EXTREMITY VENOUS DOPPLER ULTRASOUND TECHNIQUE: Gray-scale sonography with compression, as well as color and duplex ultrasound, were performed to evaluate the deep venous system(s) from the level of the common femoral vein through the popliteal and proximal calf veins. COMPARISON:  None Available. FINDINGS: VENOUS Normal compressibility of the common femoral, superficial femoral, and popliteal veins, as well as the visualized calf veins. Visualized portions of profunda femoral vein and great saphenous vein unremarkable. No filling defects to suggest DVT on grayscale or color Doppler imaging. Doppler waveforms show normal direction of venous flow, normal respiratory plasticity and response to augmentation. Limited views of the contralateral common femoral vein are unremarkable. OTHER None. Limitations: none IMPRESSION: Negative. Electronically Signed   By: Malachy Moan M.D.   On: 09/23/2023 17:51    Pending Labs Unresulted Labs (From admission, onward)     Start     Ordered   09/25/23 0500  CBC  Tomorrow morning,   R        09/24/23 1257   09/25/23 0500  Comprehensive metabolic panel  Tomorrow morning,   R        09/24/23 1257            Vitals/Pain Today's Vitals    09/24/23 1145 09/24/23 1200 09/24/23 1215 09/24/23 1230  BP: (!) 114/47 (!) 114/53 (!) 108/50 (!) 107/52  Pulse: 81 75 82 72  Resp: 18 17 19 16   Temp:      TempSrc:      SpO2: 100% 100% 99% 100%  Weight:      Height:      PainSc:        Isolation Precautions No active isolations  Medications Medications  acetaminophen (TYLENOL) tablet 1,000 mg (1,000 mg Oral Given 09/24/23 1202)  Tdap (BOOSTRIX) injection 0.5 mL (0.5 mLs Intramuscular Given 09/24/23 1203)    Mobility walks with device     Focused Assessments Neuro Assessment Handoff:  Swallow screen pass? Yes  Cardiac Rhythm: Normal sinus rhythm       Neuro Assessment:   Neuro Checks:      Has TPA been given? No If patient is a Neuro Trauma and patient is going to OR before floor call report to 4N Charge nurse: [phone] or [phone]   R Recommendations: See Admitting Provider Note  Report given to:   Additional Notes: axox4, VSS

## 2023-09-24 NOTE — Consult Note (Signed)
WOC Nurse Consult Note: Consult requested for left outer ankle chronic wound, which is followed as an outpatient at a wound care center.  Pt has previously wearing a compression sock, which was cut off and can no longer be used.  Left outer ankle  1.5X1.5X.2cm, Pt is either using Iodosorb or solid Medihoney dressings, thick yellow crumbly particles to wound and this is changed weekly.  Informed family at the bedside that we do not have either of these options in the [facility] formulary, and they stated they will bring it in from home later today and it can be applied by the bedside nurse.  Topical treatment orders provided for the bedside nure to perform as follows: Family will bring in wound care product from home; please apply to left outer ankle when available and cover with foam dressing, then acewrap, beginning just behind toes to below knee.  Change dressing Q Thurs. Pt should resume follow-up with the outpatient wound center after discharge.  Please re-consult if further assistance is needed.  Thank-you,  Cammie Mcgee MSN, RN, CWOCN, [place], CNS [phone]

## 2023-09-24 NOTE — Plan of Care (Signed)

## 2023-09-24 NOTE — Progress Notes (Deleted)
    We were paged to see [name] [name] due to ongoing bleeding from a skin tear at his left neck despite adequate dressing. Patient was seen at bed side. He was laying in bed, had his dressing in place and responding approprietly to questions . We took down the dressing to examine the site. He had dried blood at the cranial portion of the tear with a mild oozing  at the caudal section. We cleaned  and dried the area with a clean  guaze and applied silver nitrate to the oozing site to control the bleeding and also put 2 new steri-strips on it. The site of injury was re-dressed with clean and sterile dressing at bedside. [name] Maheshwari also expressed concerns for left ankle pain. His CMP was reviewed to ensure his kidney function was good to tolerate ibuprofen for pain. We ordered 800 mg one-time Ibuprofen and also put in some PRN tylenol for him. We will hold Eliquis this evening as well.   NB: Please reach out to me if dressing saturates again  Kathleen Lime, MD 09/24/2023, 8:07 PM Pager [phone]

## 2023-09-24 NOTE — Progress Notes (Signed)
ARTIN, BOUCH ([id number]) [phone] [id number] Page 1 of 5 Visit Report for 09/22/2023 Abuse Risk Screen Details Patient Name: Date of Service: [name] 09/22/2023 9:30 A M Medical Record Number: [id number] Patient Account Number: [id number] Date of Birth/Sex: Treating RN: [birth year] (89 y.o. [name]) [name] Primary Care [name]: [name] Other Clinician: Referring [name]: Treating [name]/Extender: [name] in Treatment: 0 Abuse Risk Screen Items Answer ABUSE RISK SCREEN: Has anyone close to you tried to hurt or harm you recentlyo No Do you feel uncomfortable with anyone in your familyo No Has anyone forced you do things that you didnt want to doo No Electronic Signature(s) Signed: 09/24/2023 4:15:38 PM By: [name] Pax RN Entered By: [name] on 09/22/2023 10:05:26 -------------------------------------------------------------------------------- Activities of Daily Living Details Patient Name: Date of Service: [name] 09/22/2023 9:30 A M Medical Record Number: [id number] Patient Account Number: [id number] Date of Birth/Sex: Treating RN: Dec 08, 1929 (89 y.o. [name]) [name] Primary Care [name]: [name] Other Clinician: Referring [name]: Treating [name]/Extender: [name] in Treatment: 0 Activities of Daily Living Items Answer Activities of Daily Living (Please select one for each item) Drive Automobile Completely Able T Medications ake Completely Able Use T elephone Completely Able Care for Appearance Completely Able Use T oilet Completely Able Bath / Shower Completely Able Dress Self Completely Able Feed Self Completely Able Walk Completely Able Get In / Out Bed Completely Able Housework Completely Able [name] ([id number]) [phone] [id number] Page 2 of 5 Prepare Meals Completely Able Handle Money Completely Able Shop  for Self Completely Able Electronic Signature(s) Signed: 09/24/2023 4:15:38 PM By: [name] Pax RN Entered By: [name] on 09/22/2023 10:05:57 -------------------------------------------------------------------------------- Education Screening Details Patient Name: Date of Service: [name] Fare NDREW J. 09/22/2023 9:30 A M Medical Record Number: [id number] Patient Account Number: [id number] Date of Birth/Sex: Treating RN: [birth year] (89 y.o. [name]) [name] Primary Care [name]: [name] Other Clinician: Referring [name]: Treating [name]/Extender: [name] in Treatment: 0 Primary Learner Assessed: Patient Learning Preferences/Education Level/Primary Language Learning Preference: Explanation Highest Education Level: High School Preferred Language: English Cognitive Barrier Language Barrier: No Translator Needed: No Memory Deficit: No Emotional Barrier: No Cultural/Religious Beliefs Affecting Medical Care: No Physical Barrier Impaired Vision: Yes Glasses Impaired Hearing: No Decreased Hand dexterity: No Knowledge/Comprehension Knowledge Level: Medium Comprehension Level: High Ability to understand written instructions: High Ability to understand verbal instructions: High Motivation Anxiety Level: Anxious Cooperation: Cooperative Education Importance: Acknowledges Need Interest in Health Problems: Asks Questions Perception: Coherent Willingness to Engage in Self-Management High Activities: Readiness to Engage in Self-Management High Activities: Electronic Signature(s) Signed: 09/24/2023 4:15:38 PM By: [name] Pax RN Entered By: [name] on 09/22/2023 10:06:34 [name] ([id number]) [id number] Page 3 of 5 -------------------------------------------------------------------------------- Fall Risk Assessment Details Patient Name: Date of Service: [name] 09/22/2023 9:30 A M Medical  Record Number: [id number] Patient Account Number: [id number] Date of Birth/Sex: Treating RN: 1929-05-31 (89 y.o. [name] Primary Care [name]: [name] Other Clinician: Referring [name]: Treating [name]/Extender: [name] in Treatment: 0 Fall Risk Assessment Items Have you had 2 or more falls in the last 12 monthso 0 Yes Have you had any fall that resulted in injury in the last 12 monthso 0 No FALLS RISK SCREEN History of falling - immediate or within 3 months 0 No Secondary diagnosis (Do you have 2 or more medical diagnoseso) 0 No Ambulatory aid None/bed rest/wheelchair/nurse  0 No Crutches/cane/walker 15 Yes Furniture 0 No Intravenous therapy Access/Saline/Heparin Lock 0 No Gait/Transferring Normal/ bed rest/ wheelchair 0 No Weak (short steps with or without shuffle, stooped but able to lift head while walking, may seek 10 Yes support from furniture) Impaired (short steps with shuffle, may have difficulty arising from chair, head down, impaired 0 No balance) Mental Status Oriented to own ability 0 Yes Electronic Signature(s) Signed: 09/22/2023 4:29:17 PM By: Angelina Pih Signed: 09/24/2023 4:15:38 PM By: [name] Pax RN Entered By: [name] on 09/22/2023 10:07:17 -------------------------------------------------------------------------------- Foot Assessment Details Patient Name: Date of Service: [name] Fare NDREW J. 09/22/2023 9:30 A M Medical Record Number: [id number] Patient Account Number: [id number] Date of Birth/Sex: Treating RN: 11/16/1929 (89 y.o. [name] Primary Care [name]: [name] Other Clinician: Referring [name]: Treating [name]/Extender: [name] in Treatment: 0 Foot Assessment Items Site Locations [name], [name] ([id number]) [phone] [id number] Page 4 of 5 + = Sensation present, - = Sensation absent, C = Callus, U = Ulcer R = Redness, W =  Warmth, M = Maceration, PU = Pre-ulcerative lesion F = Fissure, S = Swelling, D = Dryness Assessment Right: Left: Other Deformity: No No Prior Foot Ulcer: No No Prior Amputation: No No Charcot Joint: No No Ambulatory Status: Ambulatory With Help Assistance Device: Cane Gait: Steady Electronic Signature(s) Signed: 09/24/2023 4:15:38 PM By: [name] Pax RN Entered By: [name] on 09/22/2023 10:19:16 -------------------------------------------------------------------------------- Nutrition Risk Screening Details Patient Name: Date of Service: [name], [name] 09/22/2023 9:30 A M Medical Record Number: [id number] Patient Account Number: [id number] Date of Birth/Sex: Treating RN: [birth year] (89 y.o. [name]) [name] Primary Care Bracen Schum: [name] Other Clinician: Referring Tiwatope Emmitt: Treating Diva Lemberger/Extender: [name] in Treatment: 0 Height (in): 68 Weight (lbs): 137 Body Mass Index (BMI): 20.8 Nutrition Risk Screening Items Score Screening NUTRITION RISK SCREEN: I have an illness or condition that made me change the kind and/or amount of food I eat 0 No I eat fewer than two meals per day 0 No I eat few fruits and vegetables, or milk products 0 No I have three or more drinks of beer, liquor or wine almost every day 0 No I have tooth or mouth problems that make it hard for me to eat 0 No [name], [name] ([id number]) [id number] Page 5 of 5 I don't always have enough money to buy the food I need 0 No I eat alone most of the time 0 No I take three or more different prescribed or over-the-counter drugs a day 1 Yes Without wanting to, I have lost or gained 10 pounds in the last six months 0 No I am not always physically able to shop, cook and/or feed myself 0 No Nutrition Protocols Good Risk Protocol 0 No interventions needed Moderate Risk Protocol High Risk Proctocol Risk Level: Good Risk Score: 1 Electronic  Signature(s) Signed: 09/24/2023 4:15:38 PM By: [name] Pax RN Entered By: [name] on 09/22/2023 10:07:31

## 2023-09-24 NOTE — H&P (Signed)
Date: 09/24/2023               Patient Name:  [name] [name] MRN: [id number]  DOB: 1929/01/18 Age / Sex: 89 y.o., male   PCP: [name], MD         Medical Service: Internal Medicine Teaching Service         Attending Physician: Dr. Inez Catalina, MD    First Contact: Dr. Meryl Dare Pager: [phone]  Second Contact: Dr. Rana Snare Pager: [phone]       After Hours (After 5p/  First Contact Pager: [phone]  weekends / holidays): Second Contact Pager: [phone]   Chief Complaint: FOT  History of Present Illness: [name]. [name] [name] is a 89 y.o. M with PMH atrial fibrillation on eliquis, history of MI s/p CABG and ischemic cardiomyopathy, HFrEF (LV EF 30%), hypotension, hypothyroidism, cancer of head/neck who presents after experiencing a fall.  Patient explains that in the night he had slid off of his bed and when he did this, he struck the L side of his face on the corner of the bedside table and fell to the floor. He had gotten up to use the restroom and may have felt dizzy while using the restroom/ambulating back, but is not certain. When he was unable to get off of the floor himself, he used his Life Alert necklace to call for help. When his family arrived at the home his daughter noticed urine in the toilet bowl and that his walker, which he uses for ambulation at night, was not near his bed.   He denies any recent illnesses or prodromal symptoms. He does experience postural dizziness particularly in the morning when he goes from laying to sitting and sits at the side of his bed for about 5 minutes before attempting to stand. He has been having some trouble with walking over the last few days but notes that to be related to pain from a recent unna boot application that was too tight. He has not had any palpitations, chest pain, trouble breathing, headaches, fevers, chills, cough, sneezing, trouble with urination or bowel movements. He has been a little dehydrated. Prior to  this incident he has been feeling normal and even drove himself to his first doctor's appointment yesterday.  He has had a syncopal episode before but his daughter explains that was due to hypoglycemia. She is worried that he may have been hypoglycemic overnight leading to this episode and exacerbating his difficulty with ambulating.  He did have LLE DVT study 09/25 because of severe calf pain. His daughter explains that on Tuesday he had an Unna boot placed that was very tight and causing significant pain. At home he took the [place] boot off which did relieve the pain, and the limb was not re-wrapped. He has just a stocking on his LLE. His foot is edematous which started when the tight boot was placed.  IMTS paged for admission.  Meds:  Tylenol 650 mg q4h PRN pain Eliquis 2.5 mg daily ASA 81 mg daily Ferrous sulfate 325 mg daily Levothyroxine 112 mcg daily  Metoprolol succinate 6.25 mg daily Midodrine 2.5 mg BID Multivitamin one tablet daily Omeprazole 20 mg daily Potassium chloride 20 mEq BID Simvastatin 40 mg daily Torsemide 20 mg BID Vitamin B12 1000 mcg daily  Allergies: Allergies as of 09/24/2023 - Review Complete 09/24/2023  Allergen Reaction Noted   Cephalexin Rash 01/30/2021   Spironolactone Nausea Only 05/09/2020   Codeine Rash and Other (  See Comments) 04/25/2014   Doxycycline Rash 11/26/2020   Past Medical History: Past Medical History:  Diagnosis Date   A-fib [facility])    a.) CHA2DS2VASc = 5 (age x 2, CHF, HTN, vascular disease history);  b.) rate/rhythm maintained on oral metoprolol succinate chronically anticoagulated with apixaban   Aortic atherosclerosis (HCC)    B12 deficiency    CHF (congestive heart failure) (HCC)    a.) TTE 11/05/2012: EF 25-35%, LV apical thrombus, distal sep AK, mild MR; b.) TTE 05/10/2014: EF 30%, mild LVH, mod BAE, mild AR/PR, mod MR/TR; c.) TTE 03/12/2020: EF 20%, sev glob HK, mod BAE, mild LAE, mod RAE, triv PR, mild AR, sev MR/TR; d.) TTE  08/21/2021: EF 30%, sev glob HK, apical dyskinesis, mild LVH, mod BAE, triv AR/PR, mod MR, sev TR   Cor pulmonale (HCC)    Coronary artery disease    a.) s/p 2v CABG 02/26/1993   Family history of adverse reaction to anesthesia    a.) PONV in 1st degree relative (daughter)   GERD (gastroesophageal reflux disease)    History of [place] spotted fever    HLD (hyperlipidemia)    HTN (hypertension), benign 04/25/2014   Hypothyroidism    Iron deficiency anemia    Long term current use of anticoagulant    a.) apixaban   Posterolateral myocardial infarction [facility])    Pulmonary HTN (HCC)    a.) TTE 05/10/2014: RVSP 48.6; b.) TTE 03/12/2020: RVSP 73.3   PVD (peripheral vascular disease) (HCC)    S/P CABG x 2 02/26/1993   a.) LIMA-LAD, SVG-OM1   Squamous cell carcinoma of skin 11/18/2021   right ear and mandible, [facility]   Squamous cell carcinoma of skin 03/18/2022   right mandible and ear/refer for Mohs/ Dr. Adriana Simas has referred pt to Dr. Nedra Hai in otolaryngology excised by Dr. Nedra Hai 05/03/22, Radiation with Dr. Rushie Chestnut   Type 2 diabetes mellitus with peripheral angiopathy (HCC) 11/10/2018   Past Surgical History: Past Surgical History:  Procedure Laterality Date   cancer removal   2023   below the right base of ear at [facility]   COLONOSCOPY     CORONARY ARTERY BYPASS GRAFT N/A 02/26/1993   Procedure: CORONARY ARTERY BYPASS GRAFT; Location: [facility]; Surgeon: Marshell Garfinkel, MD   HERNIA REPAIR     LOWER EXTREMITY ANGIOGRAPHY Left 08/30/2021   Procedure: LOWER EXTREMITY ANGIOGRAPHY;  Surgeon: Renford Dills, MD;  Location: [facility] INVASIVE CV LAB;  Service: Cardiovascular;  Laterality: Left;   Family History: Daughter who had cardiac arrest, daughter who currently has cancer, son who had massive MI.  Social History: Lives home alone, normally independent in ADLs and IADLs. PCP is Dr. Hyacinth Meeker with [facility].  No alcohol, tobacco, or other substance use. Has good family support nearby. Has a cane for  ambulation excepr for at night when he uses a walker.  Review of Systems: A complete ROS was negative except as per HPI.   Physical Exam: Blood pressure (!) 107/52, pulse 72, temperature 98.3 F (36.8 C), temperature source Oral, resp. rate 16, height 5\' 8"  (1.727 m), weight 61.7 kg, SpO2 100%. Constitutional:Elderly gentleman, appears stated age, resting upright in no acute distress. HENT: Multiple bleeding skin tears on the L sife of his face with hematoma underneath.  Cardio:Regular rate and irregular rhythm. No murmurs, rubs, or gallops. Pulm:Clear to auscultation bilaterally. Normal work of breathing on room air. MSK/skin:L foot with 1-2+ pitting edema. L lateral malleolus has a non-draining ~2cm ulcer. Mildly increased warmth and  erythema of distal LLE compared to R. RLE with venous stasis discoloration. Neuro:Alert and oriented x3. No focal deficit noted. Psych:Pleasant mood and affect.  EKG: personally reviewed my interpretation is atrial fibrillation with occasional PVCs.  CT head and C-spine: 1.Stable chronic subdural fluid collections over the frontal convexities most compatible with chronic hygromas or subdural hematomas, stable. 2.No acute bony abnormality in the cervical spine.  Assessment & Plan by Problem: Principal Problem:   Fall at home  Fall at home on thinners Question true syncope as patient remembers that he slipped out of his bed and that is when he fell and struck the side of his head, though his daughter feels confident that he used the restroom in the night as there was urine in the toilet bowl but Mr. Deschenes does not recall getting up to use the restroom. He does say he may have been dizzy when getting out of bed to use the restroom/while using the restroom. He also did not use his walker for this trip as it was found by his family away from the bed. He has atrial fibrillation but this is not new and I have low suspicion for arrhythmia as etiology of possible  syncopal event. He does have evidence of orthostatic hypotension based on his report that he needs to sit for several minutes each morning before standing so that he does not feel dizzy. Further he is on medication to increase his blood pressure. His daughter does explain that previously when he has had a syncopal episode, his blood sugar was low but on check here it was 115. Plan: -Telemetry monitoring -Orthostatic VS -PT/OT -Echocardiogram  Chronic venous insufficiency Venous stasis ulcer, L lateral malleolus S/p angioplasty and stent placement to L superficial femoral and popliteal arteries in 2022.  DVT studies 09/25 were negative for acute DVT of LLE. Distal LLE is somewhat more warm with erythema compared to distal RLE, ~2cm ulcer on lateral malleolus noted. R foot with 1-2+ pitting edema which is not acute. He does have a leukocytosis to 17.5 however afebrile, normal lactic acid, and is being chronically managed by outpatient wound care who saw the patient as recently as Wednesday with regular dressing changes. Not inclined to start antibiotics at this time but low threshold if worsening of exam or new fever, progression of leukocytosis. Plan: -Wound care consult -Will check ABI, consider VVS consult if abnormal -Blood culture, start broad spectrum antibiotics if does fever  Atrial fibrillation on eliquis Rate controlled with irregular rhythm on exam. On eliquis 2.5 mg, ASA 81 mg daily, metoprolol 6.25 mg daily as OP. CHA2DS2-VASc score 5. Plan: -Telemetry monitoring -Resume home eliquis, ASA tomorrow  -Hold metoprolol 6.25 mg daily given mild hypotension  Macrocytic anemia Vitamin B12 deficiency Thrombocytopenia Hgb stable. Is on vitamin D supplementation as outpatient. Platelets 103. Plan: -Trend CBC -Continue home vitamin B12 1000 mcg daily -Continue home ferrous sulfate 325 mg daily  Hypokalemia Plan: -Replete K now -Trend metabolic panel, replete electrolytes as  indicated  Isolated hyperbilirubinemia Plan: -Repeat CMP in AM, if persists would check fractionated bilirubin  Prior MI s/p CABG 1994 Ischemic cardiomyopathy HFrEF LV EF 30% Hyperlipidemia Clinically hypovolemic. Has declined ICD in the past per chart review. Last echocardiogram that I can see was in 07/2021 and showed LV EF 30%, severe LV systolic dysfunction, moderate RV systolic dysfunction, severe TR, moderate MR, significantly elevated pulmonary artery pressure. Plan: -Hold home torsemide 20 mg BID given hypovolemia and mild hypotension -Continue home simvastatin 40 mg  daily  Hypotension Chronic problem, has required ongoing adjustments of medication regimen including that he is now taking midodrine. Has had some soft BP while here. Plan: -Continue home midodrine 2.5 mg BID -Will give 500 cc LR bolus over 1 hour for volume support; can give additional fluid as infusion vs bolus if indicated and volume status can tolerate it  Hypothyroidism Plan: -Continue home levothyroxine 112 mcg daily  GERD Plan: -Continue home omeprazole 20 mg daily  Dispo: Admit patient to Inpatient with expected length of stay greater than 2 midnights.  SignedChamp Mungo, DO 09/24/2023, 12:57 PM  After 5pm on weekdays and 1pm on weekends: On Call pager: [phone]

## 2023-09-24 NOTE — ED Triage Notes (Signed)
Pt BIB EMS due to a fall on thinners. Pt is on elliquis. Pt fell at home this morning, pts life alert notified family. Pt hit head. 6in avulsion to left side of neck. Bleeding controlled. Axox4. VSS.

## 2023-09-24 NOTE — Progress Notes (Addendum)
    We were paged to see [name] [name] due to ongoing bleeding from a skin tear at his left neck despite adequate dressing. Patient was seen at bed side. He was laying in bed, had his dressing in place and responding approprietly to questions . We took down the dressing to examine the site. He had dried blood at the cranial portion of the tear with a mild oozing  at the caudal section. We cleaned  and dried the area with a clean  guaze and applied silver nitrate to the oozing site to control the bleeding and also put 2 new steri-strips on it. The site of injury was re-dressed with clean and sterile dressing at bedside. [name] Maheshwari also expressed concerns for left ankle pain. His CMP was reviewed to ensure his kidney function was good to tolerate ibuprofen for pain. We ordered 800 mg one-time Ibuprofen and also put in some PRN tylenol for him. We will hold Eliquis this evening as well.   NB: Please reach out to me if dressing saturates again  Kathleen Lime, MD 09/24/2023, 8:07 PM Pager [phone]

## 2023-09-24 NOTE — Progress Notes (Signed)
Orthopedic Tech Progress Note Patient Details:  [name] [birth year] [id number]  Patient ID: [name] [name], male   DOB: 05-May-1929, 89 y.o.   MRN: [id number] Level 2 trauma, no needs currently  Edessa Jakubowicz OTR/L  09/24/2023, 10:04 AM

## 2023-09-24 NOTE — Evaluation (Addendum)
Physical Therapy Evaluation Patient Details Name: [name] [name] MRN: [id number] DOB: Apr 30, 1929 Today's Date: 09/24/2023  History of Present Illness  Pt is 89 yo presenting to [facility] following fall at home hitting his neck/face after sliding out of bed. Pt also recently had unna boot placed that was too tight; pt removed and now has some swelling in the L foot. PMH: A fib on eliquis, MI s/p CABG, ischemic cardiomyopathy, HFEF, Hypotension, Hypothyroidism, cx of head/neck.  Clinical Impression  Pt is presenting below baseline level of functioning. Prior to hospitalization pt was Mod I with RW/SPC intermittently for all functional mobility. Currently pt is CGA for bed mobility with extra time, Min A for sit to stand and CGA for short in-home distance gait with RW. Due to pt current functional status, home set up and available assistance at home recommending skilled physical therapy services 3x/weekly on discharge from acute care hospital setting to work on strength, balance and overall functional mobility in order to decrease risk for falls, injury and re-hospitalization. Pt tolerated treatment session well orthostatics: 104/45, Sitting 104/63, Standing 111/54.       If plan is discharge home, recommend the following: A little help with walking and/or transfers;Assist for transportation;Help with stairs or ramp for entrance;Assistance with cooking/housework     Equipment Recommendations None recommended by PT     Functional Status Assessment Patient has had a recent decline in their functional status and demonstrates the ability to make significant improvements in function in a reasonable and predictable amount of time.     Precautions / Restrictions Precautions Precautions: Fall Restrictions Weight Bearing Restrictions: No      Mobility  Bed Mobility Overal bed mobility: Needs Assistance Bed Mobility: Supine to Sit, Sit to Supine     Supine to sit: Contact guard Sit to supine:  Contact guard assist   General bed mobility comments: for stability with use of bed rails    Transfers Overall transfer level: Needs assistance Equipment used: Rolling walker (2 wheels) Transfers: Sit to/from Stand Sit to Stand: Min assist           General transfer comment: initially pt tried to stand without assist 2x and was unable due to weakness. Min A for sit to stand from EOB with Min a for initial momentum to get to standing from sitting.    Ambulation/Gait Ambulation/Gait assistance: Contact guard assist Gait Distance (Feet): 50 Feet Assistive device: Rolling walker (2 wheels) Gait Pattern/deviations: Step-through pattern, Trunk flexed, Decreased stride length Gait velocity: Decreased Gait velocity interpretation: <1.8 ft/sec, indicate of risk for recurrent falls   General Gait Details: Mod verbal cues throughout session to maintain body habitus about half way inside RW and to keep RW on the floor. Pt daughter states pt picks up walker at home and carries it in front of him. Education to pt to keep RW on the ground for stability and to remain close to prevent anterior displacement of AD leading to falls.  Stairs Stairs:  (not attempted at this time. Demonstrates adequate strength to navigate steps per home set up with some assistance.)            Balance Overall balance assessment: Needs assistance Sitting-balance support: Single extremity supported Sitting balance-Leahy Scale: Fair     Standing balance support: Bilateral upper extremity supported, Reliant on assistive device for balance Standing balance-Leahy Scale: Fair       Pertinent Vitals/Pain Pain Assessment Pain Assessment: No/denies pain    Home Living Family/patient expects to  be discharged to:: Private residence Living Arrangements: Alone Available Help at Discharge: Family;Available PRN/intermittently (daughters. Daughter present states she is going to try to get 24/7 care for a few days for  patient.) Type of Home: House Home Access: Stairs to enter Entrance Stairs-Rails: Right Entrance Stairs-Number of Steps: 3   Home Layout: One level Home Equipment: Standard Walker;Cane - single point;Shower seat Additional Comments: Daughter states pt cane has a wide base but is not quad cane    Prior Function Prior Level of Function : Independent/Modified Independent;Driving;History of Falls (last six months)             Mobility Comments: uses RW sometimes other times Cane and furniture walking. pt has had 2 falls in 6 months. ADLs Comments: Pt sits on the toilet and sponge bathes and has done this his entire life. Pt cooks simple meals mostly but sometimes more complicated stuff, drives, gardens. His daughters help with cleaning and pills.     Extremity/Trunk Assessment   Upper Extremity Assessment Upper Extremity Assessment: Defer to OT evaluation    Lower Extremity Assessment Lower Extremity Assessment: Generalized weakness    Cervical / Trunk Assessment Cervical / Trunk Assessment: Kyphotic;Other exceptions Cervical / Trunk Exceptions: pt daughter states he has scoliosis  Communication   Communication Communication: Hearing impairment Cueing Techniques: Verbal cues;Gestural cues;Tactile cues;Visual cues  Cognition Arousal: Alert Behavior During Therapy: WFL for tasks assessed/performed Overall Cognitive Status: Within Functional Limits for tasks assessed       General Comments General comments (skin integrity, edema, etc.): Pt open area on the L cervical area continues to bleed and bandage kept falling off throughout session; RN aware        Assessment/Plan    PT Assessment Patient needs continued PT services  PT Problem List Decreased strength;Decreased mobility;Decreased safety awareness;Decreased balance       PT Treatment Interventions DME instruction;Therapeutic exercise;Gait training;Balance training;Stair training;Neuromuscular  re-education;Functional mobility training;Therapeutic activities;Patient/family education    PT Goals (Current goals can be found in the Care Plan section)  Acute Rehab PT Goals Patient Stated Goal: to return home PT Goal Formulation: With patient/family Time For Goal Achievement: 10/08/23 Potential to Achieve Goals: Good    Frequency Min 1X/week     AM-PAC PT "6 Clicks" Mobility  Outcome Measure Help needed turning from your back to your side while in a flat bed without using bedrails?: A Little Help needed moving from lying on your back to sitting on the side of a flat bed without using bedrails?: A Little Help needed moving to and from a bed to a chair (including a wheelchair)?: A Little Help needed standing up from a chair using your arms (e.g., wheelchair or bedside chair)?: A Little Help needed to walk in hospital room?: A Little Help needed climbing 3-5 steps with a railing? : A Little 6 Click Score: 18    End of Session Equipment Utilized During Treatment: Gait belt Activity Tolerance: Patient tolerated treatment well Patient left: in bed;with call bell/phone within reach;with bed alarm set;with family/visitor present;with nursing/sitter in room Nurse Communication: Mobility status PT Visit Diagnosis: Other abnormalities of gait and mobility (R26.89);Unsteadiness on feet (R26.81);History of falling (Z91.81);Muscle weakness (generalized) (M62.81)    Time: 1500-1540 PT Time Calculation (min) (ACUTE ONLY): 40 min   Charges:   PT Evaluation $PT Eval Low Complexity: 1 Low PT Treatments $Gait Training: 8-22 mins $Therapeutic Activity: 8-22 mins PT General Charges $$ ACUTE PT VISIT: 1 Visit  Harrel Carina, DPT, CLT  Acute Rehabilitation Services Office: [phone] (Secure chat preferred)   Claudia Desanctis 09/24/2023, 4:29 PM
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
## 2023-09-24 NOTE — Evaluation (Signed)
Occupational Therapy Evaluation Patient Details Name: [name] [name] MRN: [id number] DOB: 09/23/1929 Today's Date: 09/24/2023   History of Present Illness Pt is 89 yo presenting to [facility] following fall at home hitting his neck/face after sliding out of bed. Pt also recently had unna boot placed that was too tight; pt removed and now has some swelling in the L foot. PMH: A fib on eliquis, MI s/p CABG, ischemic cardiomyopathy, HFEF, Hypotension, Hypothyroidism, cx of head/neck.   Clinical Impression   Pt s/p above diagnosis. Pt c/o pain at rest to B feet, did not limit participation. Pt family present during session. Pt A/Ox4, PLOF mod I at home, assistance with IADLs from family at times. Pt currently requires min A for balance with ambulation/standing ADLs, min A for dressing/bathing, will need assistance/set up with meals/IADLs. Family plans to have aide 24/7 as needed, HHOT follow up recommended to maximize safety/independence in home environment, will follow acutely to improve as able. Pt and family state they have all DME needed to remain safe/independent at home. Pt and family instructed on safety awareness with mobility using RW.      If plan is discharge home, recommend the following: A little help with walking and/or transfers;A little help with bathing/dressing/bathroom;Assistance with cooking/housework;Assist for transportation;Help with stairs or ramp for entrance    Functional Status Assessment  Patient has had a recent decline in their functional status and demonstrates the ability to make significant improvements in function in a reasonable and predictable amount of time.  Equipment Recommendations  None recommended by OT    Recommendations for Other Services       Precautions / Restrictions Precautions Precautions: Fall Restrictions Weight Bearing Restrictions: No      Mobility Bed Mobility Overal bed mobility: Needs Assistance Bed Mobility: Supine to Sit, Sit to  Supine     Supine to sit: Min assist, HOB elevated Sit to supine: Contact guard assist   General bed mobility comments: min A for supine to sit, CGA for back into bed, scooting back    Transfers Overall transfer level: Needs assistance Equipment used: Rolling walker (2 wheels) Transfers: Sit to/from Stand Sit to Stand: Min assist           General transfer comment: min A for balance and to power STS      Balance Overall balance assessment: Needs assistance Sitting-balance support: Single extremity supported Sitting balance-Leahy Scale: Fair Sitting balance - Comments: EOB ADLs, slight posterior lean due to stiffness in back, difficulty bending down to perform LB dressing, able to with increased time. Postural control: Posterior lean Standing balance support: Bilateral upper extremity supported, Reliant on assistive device for balance Standing balance-Leahy Scale: Poor Standing balance comment: able to stand supported at sink, not able to tolerate challenge, loss of balance with standing, decreased ability to reach or lean                           ADL either performed or assessed with clinical judgement   ADL Overall ADL's : Needs assistance/impaired Eating/Feeding: Set up;Sitting   Grooming: Set up;Sitting   Upper Body Bathing: Minimal assistance;Sitting   Lower Body Bathing: Minimal assistance;Sitting/lateral leans   Upper Body Dressing : Minimal assistance;Sitting   Lower Body Dressing: Minimal assistance;Sit to/from stand   Toilet Transfer: Minimal assistance;Ambulation;Comfort height toilet;Rolling walker (2 wheels)   Toileting- Clothing Manipulation and Hygiene: Supervision/safety;Sit to/from stand       Functional mobility during ADLs: Minimal  assistance;Rolling walker (2 wheels) General ADL Comments: min A for balance with mobility, some LOB and unsteadiness with ambulation/standing. Pt requires min A for dressing/bathing.     Vision  Baseline Vision/History: 1 Wears glasses Ability to See in Adequate Light: 0 Adequate Patient Visual Report: No change from baseline       Perception         Praxis         Pertinent Vitals/Pain Pain Assessment Pain Assessment: 0-10 Pain Score: 8  Pain Location: B feet Pain Descriptors / Indicators: Aching, Constant Pain Intervention(s): Monitored during session     Extremity/Trunk Assessment Upper Extremity Assessment Upper Extremity Assessment: Defer to OT evaluation   Lower Extremity Assessment Lower Extremity Assessment: Generalized weakness   Cervical / Trunk Assessment Cervical / Trunk Assessment: Kyphotic;Other exceptions Cervical / Trunk Exceptions: pt daughter states he has scoliosis   Communication Communication Communication: Hearing impairment Cueing Techniques: Verbal cues;Gestural cues;Tactile cues;Visual cues   Cognition Arousal: Alert Behavior During Therapy: WFL for tasks assessed/performed Overall Cognitive Status: Within Functional Limits for tasks assessed                                 General Comments: A/Ox4     General Comments  Pt open area on the L cervical area continues to bleed and bandage kept falling off throughout session; RN aware. orthostatics: Supine: 104/45, Sitting 104/63, Standing 111/54.    Exercises     Shoulder Instructions      Home Living Family/patient expects to be discharged to:: Private residence Living Arrangements: Alone Available Help at Discharge: Family;Available PRN/intermittently Type of Home: House Home Access: Stairs to enter [employer] of Steps: 3 Entrance Stairs-Rails: Right Home Layout: One level     Bathroom Shower/Tub: Chief Strategy Officer: Standard Bathroom Accessibility: No   Home Equipment: Standard Walker;Cane - single point;Shower seat   Additional Comments: Daughter states she will get an Landscape architect as needed.      Prior  Functioning/Environment Prior Level of Function : Independent/Modified Independent;Driving;History of Falls (last six months)             Mobility Comments: uses RW sometimes other times Cane and furniture walking. pt has had 2 falls in 6 months. ADLs Comments: Pt sits on the toilet and sponge bathes and has done this his entire life. Pt cooks simple meals mostly but sometimes more complicated stuff, drives, gardens. His daughters help with cleaning and pills.        OT Problem List: Decreased strength;Decreased range of motion;Decreased activity tolerance;Impaired balance (sitting and/or standing);Impaired UE functional use;Pain;Increased edema      OT Treatment/Interventions: Self-care/ADL training;Therapeutic exercise;Energy conservation;DME and/or AE instruction;Manual therapy;Therapeutic activities;Patient/family education;Balance training    OT Goals(Current goals can be found in the care plan section) Acute Rehab OT Goals Patient Stated Goal: to return home, improve balance OT Goal Formulation: With patient/family Time For Goal Achievement: 10/08/23 Potential to Achieve Goals: Good  OT Frequency: Min 1X/week    Co-evaluation              AM-PAC OT "6 Clicks" Daily Activity     Outcome Measure Help from another person eating meals?: A Little Help from another person taking care of personal grooming?: A Little Help from another person toileting, which includes using toliet, bedpan, or urinal?: A Little Help from another person bathing (including washing, rinsing, drying)?: A Little Help from another  person to put on and taking off regular upper body clothing?: A Little Help from another person to put on and taking off regular lower body clothing?: A Little 6 Click Score: 18   End of Session Equipment Utilized During Treatment: Gait belt;Rolling walker (2 wheels) Nurse Communication: Mobility status  Activity Tolerance: Patient tolerated treatment well Patient left:  in bed;with call bell/phone within reach;with bed alarm set;with family/visitor present  OT Visit Diagnosis: Unsteadiness on feet (R26.81);Other abnormalities of gait and mobility (R26.89);Repeated falls (R29.6);Muscle weakness (generalized) (M62.81);Pain Pain - part of body: Ankle and joints of foot                Time: 1715-1742 OT Time Calculation (min): 27 min Charges:  OT General Charges $OT Visit: 1 Visit OT Evaluation $OT Eval Low Complexity: 1 Low OT Treatments $Self Care/Home Management : 8-22 mins  Ralls, OTR/L   Alexis Goodell 09/24/2023, 5:48 PM

## 2023-09-24 NOTE — ED Provider Notes (Signed)
[place] EMERGENCY DEPARTMENT AT [facility] Provider Note   CSN: [id number] Arrival date & time: 09/24/23  9562     History  Chief Complaint  Patient presents with   [name] [name] is a 89 y.o. male.  89 yo M with a chief complaint of falling out of bed.  The patient states that he rolled off the side of the bed and he struck something along the side of the bed and scratched the left side of his neck.  He denies any other injury.  He denies chest pain back pain abdominal pain extremity pain.  He lives at home and normally ambulates independently.  He is on Eliquis.   Fall       Home Medications Prior to Admission medications   Medication Sig Start Date End Date Taking? Authorizing Provider  acetaminophen (TYLENOL) 500 MG tablet Take 1,000 mg by mouth every 6 (six) hours as needed for moderate pain.    [provider]  apixaban (ELIQUIS) 5 MG TABS tablet Take 2.5 mg by mouth daily.    [provider]  ASPIRIN LOW DOSE 81 MG tablet TAKE 1 TABLET(81 MG) BY MOUTH DAILY. SWALLOW WHOLE 09/12/22   Nada Libman, MD  cetirizine (ZYRTEC) 10 MG tablet Take 10 mg by mouth daily as needed for allergies.    [provider]  Cyanocobalamin (VITAMIN B-12) 1000 MCG SUBL Take 1,000 mcg by mouth daily. 03/23/14   [provider]  ferrous sulfate 325 (65 FE) MG EC tablet Take 325 mg by mouth 2 (two) times a week. Tuesday and Thursday    [provider]  levothyroxine (SYNTHROID, LEVOTHROID) 112 MCG tablet Take 112 mcg by mouth daily. 03/28/14   [provider]  magnesium hydroxide (MILK OF MAGNESIA) 400 MG/5ML suspension Take by mouth daily as needed for mild constipation.    [provider]  meclizine (ANTIVERT) 12.5 MG tablet Take 1 tablet (12.5 mg total) by mouth 3 (three) times daily as needed for dizziness. 05/22/22   Ward, Layla Maw, DO  metoprolol succinate (TOPROL-XL) 50 MG 24 hr tablet Take 25 mg by mouth  daily. 03/28/14   [provider]  Multiple Vitamins-Minerals (MULTIVITAMIN WITH MINERALS) tablet Take 1 tablet by mouth daily. 06/11/16   [provider]  Omega-3 Fatty Acids (FISH OIL) 1000 MG CAPS Take 1,000 mg by mouth in the morning and at bedtime.    [provider]  omeprazole (PRILOSEC) 20 MG capsule Take 20 mg by mouth daily. 01/06/17 07/15/23  [provider]  polyethylene glycol (MIRALAX / GLYCOLAX) packet Take 17 g by mouth daily. Mix one tablespoon with 8oz of your favorite juice or water every day until you are having soft formed stools. Then start taking once daily if you didn't have a stool the day before. 06/01/18   Willy Eddy, MD  potassium chloride (KLOR-CON) 10 MEQ tablet Take 30 mEq by mouth once. 05/28/22   [provider]  simvastatin (ZOCOR) 40 MG tablet Take 40 mg by mouth daily. 03/28/14   [provider]  tizanidine (ZANAFLEX) 2 MG capsule Take 1 capsule (2 mg total) by mouth 3 (three) times daily as needed for muscle spasms. 02/20/23   Derwood Kaplan, MD  torsemide (DEMADEX) 20 MG tablet Take 20 mg by mouth daily.    [provider]      Allergies    Cephalexin, Spironolactone, Codeine, and Doxycycline    Review of Systems  Review of Systems  Physical Exam Updated Vital Signs BP (!) 115/46   Pulse 81   Temp 98.3 F (36.8 C) (Oral)   Resp (!) 21   Ht 5\' 8"  (1.727 m)   Wt 61.7 kg   SpO2 100%   BMI 20.68 kg/m  Physical Exam Vitals and nursing note reviewed.  Constitutional:      Appearance: He is well-developed.  HENT:     Head: Normocephalic.     Comments: Patient has 2 skin tears along the left side of the neck.  He has a very small hematoma.  No obvious midline spinal tenderness offs or deformities.  He is able to rotate his head without obvious discomfort. Eyes:     Pupils: Pupils are equal, round, and reactive to light.  Neck:     Vascular: No JVD.  Cardiovascular:     Rate and  Rhythm: Normal rate and regular rhythm.     Heart sounds: No murmur heard.    No friction rub. No gallop.  Pulmonary:     Effort: No respiratory distress.     Breath sounds: No wheezing.  Abdominal:     General: There is no distension.     Tenderness: There is no abdominal tenderness. There is no guarding or rebound.  Musculoskeletal:        General: Normal range of motion.     Cervical back: Normal range of motion and neck supple.  Skin:    Coloration: Skin is not pale.     Findings: No rash.  Neurological:     Mental Status: He is alert and oriented to person, place, and time.  Psychiatric:        Behavior: Behavior normal.     ED Results / Procedures / Treatments   Labs (all labs ordered are listed, but only abnormal results are displayed) Labs Reviewed  COMPREHENSIVE METABOLIC PANEL  CBC  SAMPLE TO BLOOD BANK    EKG EKG Interpretation Date/Time:  Thursday September 24 2023 10:09:03 EDT Ventricular Rate:  83 PR Interval:    QRS Duration:  136 QT Interval:  434 QTC Calculation: 510 R Axis:   -23  Text Interpretation: Atrial fibrillation Ventricular premature complex Left bundle branch block Artifact in lead(s) I III aVR aVL aVF No significant change since last tracing Confirmed by Melene Plan [phone]) on 09/24/2023 10:13:11 AM  Radiology US Venous Img Lower Unilateral Left (DVT)  Result Date: 09/23/2023 CLINICAL DATA:  Left calf pain EXAM: LEFT LOWER EXTREMITY VENOUS DOPPLER ULTRASOUND TECHNIQUE: Gray-scale sonography with compression, as well as color and duplex ultrasound, were performed to evaluate the deep venous system(s) from the level of the common femoral vein through the popliteal and proximal calf veins. COMPARISON:  None Available. FINDINGS: VENOUS Normal compressibility of the common femoral, superficial femoral, and popliteal veins, as well as the visualized calf veins. Visualized portions of profunda femoral vein and great saphenous vein unremarkable. No  filling defects to suggest DVT on grayscale or color Doppler imaging. Doppler waveforms show normal direction of venous flow, normal respiratory plasticity and response to augmentation. Limited views of the contralateral common femoral vein are unremarkable. OTHER None. Limitations: none IMPRESSION: Negative. Electronically Signed   By: Malachy Moan M.D.   On: 09/23/2023 17:51    Procedures Procedures    Medications Ordered in ED Medications - No data to display  ED Course/ Medical Decision Making/ A&P  Medical Decision Making Amount and/or Complexity of Data Reviewed Labs: ordered. Radiology: ordered. ECG/medicine tests: ordered.  Risk OTC drugs. Prescription drug management. Decision regarding hospitalization.   89 yo M with a chief complaints of fall out of bed.  He said otherwise he has been doing well.  He had a injury to the left side of his neck otherwise denies injury with the fall.  He does have a skin tear there.  No obvious deep laceration.  He has a small hematoma that I think is less likely to be due to great vessel injury and more likely to be due to him being on Eliquis.  Will start with a CT of the head and C-spine.  Basic blood work.  Reassess.  On my record review the patient's tetanus is within 10 years.   I did discuss this with the patient and he would like his tetanus free updated.  CT of the head and C-spine are without obvious acute intracranial hemorrhage or new C-spine injury.  The patient's daughter has arrived and tells me that things have been getting worse at home.  Tells me that the patient has had worsening difficulty with ambulation secondary to left leg pain.  He is being followed for this by his family doctor just had a DVT study done yesterday.  He was unable to get up to get to the phone last night and so they came over to visit him and found him having a lot of trouble with mobilization which is not new for  him.  I will discuss this with medicine for possible admission.  The patients results and plan were reviewed and discussed.   Any x-rays performed were independently reviewed by myself.   Differential diagnosis were considered with the presenting HPI.  Medications  acetaminophen (TYLENOL) tablet 1,000 mg (1,000 mg Oral Given 09/24/23 1202)  Tdap (BOOSTRIX) injection 0.5 mL (0.5 mLs Intramuscular Given 09/24/23 1203)    Vitals:   09/24/23 1145 09/24/23 1200 09/24/23 1215 09/24/23 1230  BP: (!) 114/47 (!) 114/53 (!) 108/50 (!) 107/52  Pulse: 81 75 82 72  Resp: 18 17 19 16   Temp:      TempSrc:      SpO2: 100% 100% 99% 100%  Weight:      Height:        Final diagnoses:  Fall, initial encounter  Laceration of neck, initial encounter    Admission/ observation were discussed with the admitting physician, patient and/or family and they are comfortable with the plan.          Final Clinical Impression(s) / ED Diagnoses Final diagnoses:  None    Rx / DC Orders ED Discharge Orders     None         Melene Plan, DO 09/24/23 1255

## 2023-09-24 NOTE — Progress Notes (Signed)
Chronic venous hypertension (idiopathic) with ulcer and inflammation of left 09/22/2023 No Yes lower extremity L97.322 Non-pressure chronic ulcer of left ankle with fat layer exposed 09/22/2023 No Yes E11.621 Type 2 diabetes mellitus with foot ulcer 09/22/2023 No Yes I10 Essential (primary) hypertension 09/22/2023 No Yes I48.0 Paroxysmal atrial fibrillation 09/22/2023 No Yes Z79.01 Long term (current) use of anticoagulants 09/22/2023 No Yes I50.42 Chronic combined systolic (congestive) and diastolic (congestive) heart failure 09/22/2023 No Yes I25.10 Atherosclerotic heart disease of native coronary artery without angina pectoris 09/22/2023 No Yes I73.89 Other specified peripheral vascular diseases 09/22/2023 No Yes Inactive Problems Resolved Problems Electronic Signature(s) Signed: 09/23/2023 4:28:36 PM By: Baltazar Najjar MD Entered By: Baltazar Najjar on 09/23/2023 15:51:06 Grayling Congress ([id number]) [id number] Page 5 of 7 -------------------------------------------------------------------------------- Progress Note Details Patient Name: Date of Service: [name] [name], [name] NDREW J. 09/23/2023 3:00 PM Medical Record Number: [id number] Patient Account Number: [id number] Date of Birth/Sex: Treating RN: 12-28-1929 (89 y.o. [name] [name]) Yevonne Pax Primary Care Provider:  Bethann Punches Other Clinician: Referring Provider: Treating Provider/Extender: RO BSO N, MICHA EL Betsy Pries, Mark Weeks in Treatment: 0 Subjective History of Present Illness (HPI) Chronic/Inactive Condition: 09-22-2023 patient's ABI was performed formally on 01-02-2022 and showed to be noncompressible with a TBI of the right of 0.68 and on the left of 0.76. In the office today he was noncompressible. 2023 Sherburn r .68 and l .76 01/02/2022 subsequent stent placment 4 months ago got sore has HH silver alginate and unna boot 09/23/2023; patient returns to clinic today because of pain. I was asked to see him. Apparently when he was driving home from the clinic yesterday he started developing pain in the left anterior leg just below the tibial tuberosity. This got worse at night he called his daughter and eventually they agreed to cut the wrap off to a level just above his foot. We told them this morning when they called to take the entire wrap off and coming to see [place]. The patient is quite adamant that the pain was on the anterior tibia just below the tibial tuberosity by 2 or 3 inches. He was not having pain around the wound. We had used Iodoflex as the primary dressing here however that was clearly not the area that was uncomfortable. He has a history of arterial disease status post stent placement but this does not sound like it was an arterial issue. Objective Constitutional Sitting or standing Blood Pressure is within target range for patient.. Pulse regular and within target range for patient.Marland Kitchen Respirations regular, non-labored and within target range.. Temperature is normal and within the target range for the patient.Marland Kitchen appears in no distress. Vitals Time Taken: 3:00 PM, Height: 68 in, Weight: 137 lbs, BMI: 20.8, Temperature: 98.2 F, Pulse: 64 bpm, Respiratory Rate: 18 breaths/min, Blood Pressure: 120/61 mmHg. Cardiovascular His pedal pulses are palpable.. 2-3+ pitting edema in the left leg. This  would have been where they cut the wrap off. He is tender in the posterior calf the edema extends a bit into the posterior thigh above the knee. General Notes: Wound exam; relatively large wound on the left lateral malleolus. Surface is not completely viable I did not debride this today. Edema in the left leg is noted. No evidence of cellulitis but he does have significant calf tenderness Integumentary (Hair, Skin) Wound #1 status is Open. Original cause of wound was Gradually Appeared. The date acquired was: 03/30/2023. The wound is located on the Left,Lateral Ankle. The wound measures 1.1cm length x 0.6cm width x  0.3cm depth; 0.518cm^2 area and 0.156cm^3 volume. There is Fat Layer (Subcutaneous Tissue) exposed. There is no tunneling or undermining noted. There is a medium amount of serosanguineous drainage noted. There is medium (34-66%) pink granulation within the wound bed. There is a medium (34-66%) amount of necrotic tissue within the wound bed including Adherent Slough. Assessment Active Problems ICD-10 Chronic venous hypertension (idiopathic) with ulcer and inflammation of left lower extremity Non-pressure chronic ulcer of left ankle with fat layer exposed Type 2 diabetes mellitus with foot ulcer KOH, AMOR ([id number]) [id number] Page 6 of 7 Essential (primary) hypertension Paroxysmal atrial fibrillation Long term (current) use of anticoagulants Chronic combined systolic (congestive) and diastolic (congestive) heart failure Atherosclerotic heart disease of native coronary artery without angina pectoris Other specified peripheral vascular diseases Plan 1. I am not really sure what caused this man's discomfort in the area described. 2. The most striking finding on exam today was tenderness in the left calf I therefore think he needs a DVT rule out here although I do not think it is urgent the next 24 to 48 hours hopefully 3. No evidence of  cellulitis 4. We continued with the Iodoflex and foam cover to the ankle wound but put him in Tubigrip C until we can get the DVT rule out. Then we will have to decide about the aggressiveness of compression here. 5. Of note the patient has had a previous stent but I do not think this was arterial. He also is on Eliquis at 2.5 mg a day Electronic Signature(s) Signed: 09/23/2023 4:28:36 PM By: Baltazar Najjar MD Entered By: Baltazar Najjar on 09/23/2023 15:56:58 -------------------------------------------------------------------------------- SuperBill Details Patient Name: Date of Service: [name] NDREW J. 09/23/2023 Medical Record Number: [id number] Patient Account Number: [id number] Date of Birth/Sex: Treating RN: October 21, 1929 (89 y.o. [name] [name]) Yevonne Pax Primary Care Provider: Bethann Punches Other Clinician: Referring Provider: Treating Provider/Extender: RO BSO N, MICHA EL Betsy Pries, Mark Weeks in Treatment: 0 Diagnosis Coding ICD-10 Codes Code Description [phone] Chronic venous hypertension (idiopathic) with ulcer and inflammation of left lower extremity L97.322 Non-pressure chronic ulcer of left ankle with fat layer exposed E11.621 Type 2 diabetes mellitus with foot ulcer I10 Essential (primary) hypertension I48.0 Paroxysmal atrial fibrillation Z79.01 Long term (current) use of anticoagulants I50.42 Chronic combined systolic (congestive) and diastolic (congestive) heart failure I25.10 Atherosclerotic heart disease of native coronary artery without angina pectoris I73.89 Other specified peripheral vascular diseases Facility Procedures : CPT4 Code: 21308657 Description: 84696 - WOUND CARE VISIT-LEV 2 EST PT Modifier: Quantity: 1 Physician Procedures : CPT4 Code Description Modifier [id number] - WC PHYS LEVEL 3 - EST PT ICD-10 Diagnosis Description KAITLYN, MCELHENNY ([id number]) [id number] [phone] Chronic venous hypertension (idiopathic) with ulcer and  inflammation of  left lower extremity L97.322 Non-pressure chronic ulcer of left ankle with fat layer exposed Quantity: 1 Page 7 of 7 Electronic Signature(s) Signed: 09/23/2023 4:46:22 PM By: Yevonne Pax RN Previous Signature: 09/23/2023 4:28:36 PM Version By: Baltazar Najjar MD Entered By: Yevonne Pax on 09/23/2023 16:46:21  0.3cm depth; 0.518cm^2 area and 0.156cm^3 volume. There is Fat Layer (Subcutaneous Tissue) exposed. There is no tunneling or undermining noted. There is a medium amount of serosanguineous drainage noted. There is medium (34-66%) pink granulation within the wound bed. There is a medium (34-66%) amount of necrotic tissue within the wound bed including Adherent Slough. Assessment Active Problems ICD-10 Chronic venous hypertension (idiopathic) with ulcer and inflammation of left lower extremity Non-pressure chronic ulcer of left ankle with fat layer exposed Type 2 diabetes mellitus with foot ulcer KOH, AMOR ([id number]) [id number] Page 6 of 7 Essential (primary) hypertension Paroxysmal atrial fibrillation Long term (current) use of anticoagulants Chronic combined systolic (congestive) and diastolic (congestive) heart failure Atherosclerotic heart disease of native coronary artery without angina pectoris Other specified peripheral vascular diseases Plan 1. I am not really sure what caused this man's discomfort in the area described. 2. The most striking finding on exam today was tenderness in the left calf I therefore think he needs a DVT rule out here although I do not think it is urgent the next 24 to 48 hours hopefully 3. No evidence of  cellulitis 4. We continued with the Iodoflex and foam cover to the ankle wound but put him in Tubigrip C until we can get the DVT rule out. Then we will have to decide about the aggressiveness of compression here. 5. Of note the patient has had a previous stent but I do not think this was arterial. He also is on Eliquis at 2.5 mg a day Electronic Signature(s) Signed: 09/23/2023 4:28:36 PM By: Baltazar Najjar MD Entered By: Baltazar Najjar on 09/23/2023 15:56:58 -------------------------------------------------------------------------------- SuperBill Details Patient Name: Date of Service: [name] NDREW J. 09/23/2023 Medical Record Number: [id number] Patient Account Number: [id number] Date of Birth/Sex: Treating RN: October 21, 1929 (89 y.o. [name] [name]) Yevonne Pax Primary Care Provider: Bethann Punches Other Clinician: Referring Provider: Treating Provider/Extender: RO BSO N, MICHA EL Betsy Pries, Mark Weeks in Treatment: 0 Diagnosis Coding ICD-10 Codes Code Description [phone] Chronic venous hypertension (idiopathic) with ulcer and inflammation of left lower extremity L97.322 Non-pressure chronic ulcer of left ankle with fat layer exposed E11.621 Type 2 diabetes mellitus with foot ulcer I10 Essential (primary) hypertension I48.0 Paroxysmal atrial fibrillation Z79.01 Long term (current) use of anticoagulants I50.42 Chronic combined systolic (congestive) and diastolic (congestive) heart failure I25.10 Atherosclerotic heart disease of native coronary artery without angina pectoris I73.89 Other specified peripheral vascular diseases Facility Procedures : CPT4 Code: 21308657 Description: 84696 - WOUND CARE VISIT-LEV 2 EST PT Modifier: Quantity: 1 Physician Procedures : CPT4 Code Description Modifier [id number] 99213 - WC PHYS LEVEL 3 - EST PT ICD-10 Diagnosis Description KAITLYN, MCELHENNY ([id number]) [id number] [phone] Chronic venous hypertension (idiopathic) with ulcer and  inflammation of  left lower extremity L97.322 Non-pressure chronic ulcer of left ankle with fat layer exposed Quantity: 1 Page 7 of 7 Electronic Signature(s) Signed: 09/23/2023 4:46:22 PM By: Yevonne Pax RN Previous Signature: 09/23/2023 4:28:36 PM Version By: Baltazar Najjar MD Entered By: Yevonne Pax on 09/23/2023 16:46:21  Chronic venous hypertension (idiopathic) with ulcer and inflammation of left 09/22/2023 No Yes lower extremity L97.322 Non-pressure chronic ulcer of left ankle with fat layer exposed 09/22/2023 No Yes E11.621 Type 2 diabetes mellitus with foot ulcer 09/22/2023 No Yes I10 Essential (primary) hypertension 09/22/2023 No Yes I48.0 Paroxysmal atrial fibrillation 09/22/2023 No Yes Z79.01 Long term (current) use of anticoagulants 09/22/2023 No Yes I50.42 Chronic combined systolic (congestive) and diastolic (congestive) heart failure 09/22/2023 No Yes I25.10 Atherosclerotic heart disease of native coronary artery without angina pectoris 09/22/2023 No Yes I73.89 Other specified peripheral vascular diseases 09/22/2023 No Yes Inactive Problems Resolved Problems Electronic Signature(s) Signed: 09/23/2023 4:28:36 PM By: Baltazar Najjar MD Entered By: Baltazar Najjar on 09/23/2023 15:51:06 Grayling Congress ([id number]) [id number] Page 5 of 7 -------------------------------------------------------------------------------- Progress Note Details Patient Name: Date of Service: [name] [name], [name] NDREW J. 09/23/2023 3:00 PM Medical Record Number: [id number] Patient Account Number: [id number] Date of Birth/Sex: Treating RN: 12-28-1929 (89 y.o. [name] [name]) Yevonne Pax Primary Care Provider:  Bethann Punches Other Clinician: Referring Provider: Treating Provider/Extender: RO BSO N, MICHA EL Betsy Pries, Mark Weeks in Treatment: 0 Subjective History of Present Illness (HPI) Chronic/Inactive Condition: 09-22-2023 patient's ABI was performed formally on 01-02-2022 and showed to be noncompressible with a TBI of the right of 0.68 and on the left of 0.76. In the office today he was noncompressible. 2023 [place]burn r .68 and l .76 01/02/2022 subsequent stent placment 4 months ago got sore has HH silver alginate and unna boot 09/23/2023; patient returns to clinic today because of pain. I was asked to see him. Apparently when he was driving home from the clinic yesterday he started developing pain in the left anterior leg just below the tibial tuberosity. This got worse at night he called his daughter and eventually they agreed to cut the wrap off to a level just above his foot. We told them this morning when they called to take the entire wrap off and coming to see [place]. The patient is quite adamant that the pain was on the anterior tibia just below the tibial tuberosity by 2 or 3 inches. He was not having pain around the wound. We had used Iodoflex as the primary dressing here however that was clearly not the area that was uncomfortable. He has a history of arterial disease status post stent placement but this does not sound like it was an arterial issue. Objective Constitutional Sitting or standing Blood Pressure is within target range for patient.. Pulse regular and within target range for patient.Marland Kitchen Respirations regular, non-labored and within target range.. Temperature is normal and within the target range for the patient.Marland Kitchen appears in no distress. Vitals Time Taken: 3:00 PM, Height: 68 in, Weight: 137 lbs, BMI: 20.8, Temperature: 98.2 F, Pulse: 64 bpm, Respiratory Rate: 18 breaths/min, Blood Pressure: 120/61 mmHg. Cardiovascular His pedal pulses are palpable.. 2-3+ pitting edema in the left leg. This  would have been where they cut the wrap off. He is tender in the posterior calf the edema extends a bit into the posterior thigh above the knee. General Notes: Wound exam; relatively large wound on the left lateral malleolus. Surface is not completely viable I did not debride this today. Edema in the left leg is noted. No evidence of cellulitis but he does have significant calf tenderness Integumentary (Hair, Skin) Wound #1 status is Open. Original cause of wound was Gradually Appeared. The date acquired was: 03/30/2023. The wound is located on the Left,Lateral Ankle. The wound measures 1.1cm length x 0.6cm width x

## 2023-09-25 ENCOUNTER — Inpatient Hospital Stay (HOSPITAL_COMMUNITY): Payer: Medicare HMO

## 2023-09-25 DIAGNOSIS — R55 Syncope and collapse: Secondary | ICD-10-CM

## 2023-09-25 DIAGNOSIS — I959 Hypotension, unspecified: Secondary | ICD-10-CM | POA: Diagnosis not present

## 2023-09-25 DIAGNOSIS — L97309 Non-pressure chronic ulcer of unspecified ankle with unspecified severity: Secondary | ICD-10-CM | POA: Diagnosis not present

## 2023-09-25 LAB — ECHOCARDIOGRAM COMPLETE
AR max vel: 1.46 cm2
AV Peak grad: 8.1 mm[Hg]
Ao pk vel: 1.43 m/s
Area-P 1/2: 4.21 cm2
Height: 68 in
MV M vel: 3.35 m/s
MV Peak grad: 44.8 mm[Hg]
S' Lateral: 3.5 cm
Weight: 2176 [oz_av]

## 2023-09-25 LAB — CBC
HCT: 26.2 % — ABNORMAL LOW (ref 39.0–52.0)
Hemoglobin: 9 g/dL — ABNORMAL LOW (ref 13.0–17.0)
MCH: 35.6 pg — ABNORMAL HIGH (ref 26.0–34.0)
MCHC: 34.4 g/dL (ref 30.0–36.0)
MCV: 103.6 fL — ABNORMAL HIGH (ref 80.0–100.0)
Platelets: 98 10*3/uL — ABNORMAL LOW (ref 150–400)
RBC: 2.53 MIL/uL — ABNORMAL LOW (ref 4.22–5.81)
RDW: 20.2 % — ABNORMAL HIGH (ref 11.5–15.5)
WBC: 17.5 10*3/uL — ABNORMAL HIGH (ref 4.0–10.5)
nRBC: 0 % (ref 0.0–0.2)

## 2023-09-25 LAB — COMPREHENSIVE METABOLIC PANEL
ALT: 26 U/L (ref 0–44)
AST: 52 U/L — ABNORMAL HIGH (ref 15–41)
Albumin: 2.6 g/dL — ABNORMAL LOW (ref 3.5–5.0)
Alkaline Phosphatase: 58 U/L (ref 38–126)
Anion gap: 7 (ref 5–15)
BUN: 29 mg/dL — ABNORMAL HIGH (ref 8–23)
CO2: 28 mmol/L (ref 22–32)
Calcium: 8.3 mg/dL — ABNORMAL LOW (ref 8.9–10.3)
Chloride: 100 mmol/L (ref 98–111)
Creatinine, Ser: 1.15 mg/dL (ref 0.61–1.24)
GFR, Estimated: >60 mL/min (ref >60)
Glucose, Bld: 138 mg/dL — ABNORMAL HIGH (ref 70–99)
Potassium: 3.2 mmol/L — ABNORMAL LOW (ref 3.5–5.1)
Sodium: 135 mmol/L (ref 135–145)
Total Bilirubin: 1.3 mg/dL — ABNORMAL HIGH (ref 0.3–1.2)
Total Protein: 5.8 g/dL — ABNORMAL LOW (ref 6.5–8.1)

## 2023-09-25 MED ORDER — MIDODRINE HCL 5 MG PO TABS
5.0000 mg | ORAL_TABLET | Freq: Three times a day (TID) | ORAL | Status: DC
  Administered 2023-09-25 – 2023-09-30 (×15): 5 mg via ORAL
  Filled 2023-09-25 (×15): qty 1

## 2023-09-25 MED ORDER — PERFLUTREN LIPID MICROSPHERE
1.0000 mL | INTRAVENOUS | Status: AC | PRN
  Administered 2023-09-25: 6 mL via INTRAVENOUS

## 2023-09-25 MED ORDER — APIXABAN 2.5 MG PO TABS: 2.5000 mg | ORAL_TABLET | Freq: Two times a day (BID) | ORAL | Status: DC

## 2023-09-25 MED ORDER — POTASSIUM CHLORIDE CRYS ER 20 MEQ PO TBCR
40.0000 meq | EXTENDED_RELEASE_TABLET | Freq: Two times a day (BID) | ORAL | Status: AC
  Administered 2023-09-25 – 2023-09-26 (×2): 40 meq via ORAL
  Filled 2023-09-25 (×2): qty 2

## 2023-09-25 MED ORDER — MIDODRINE HCL 5 MG PO TABS: 5.0000 mg | ORAL_TABLET | Freq: Two times a day (BID) | ORAL | Status: DC

## 2023-09-25 MED ORDER — CARMEX CLASSIC LIP BALM EX OINT
TOPICAL_OINTMENT | CUTANEOUS | Status: DC | PRN
  Administered 2023-09-26: 1 via TOPICAL
  Filled 2023-09-25: qty 10

## 2023-09-25 NOTE — TOC Initial Note (Signed)
Transition of Care (TOC) - Initial/Assessment Note   Spoke to patient and daughter [name] [name] at bedside.   Patient from home alone with [facility] with [facility] .   At discharge [name] [name] has arranged 24/7 assistance from [employer] for dad at first and her and her husband will also be assisting.   They do want to continue with [facility] . Confirmed with Kandee Keen with [facility] they can provide HHRN,PT,OT.   Entered orders and face to face . Secure chatted attending to sign  Patient Details  Name: [name] [name] MRN: [id number] Date of Birth: 05/20/1929  Transition of Care [facility]) CM/SW Contact:    Kingsley Plan, RN Phone Number: 09/25/2023, 12:10 PM  Clinical Narrative:                   Expected Discharge Plan: Home w Home Health Services Barriers to Discharge: Continued Medical Work up   Patient Goals and CMS Choice Patient states their goals for this hospitalization and ongoing recovery are:: to return to home CMS Medicare.gov Compare Post Acute Care list provided to:: Patient Choice offered to / list presented to : Patient [place] ownership interest in [facility].provided to:: Patient    Expected Discharge Plan and Services   Discharge Planning Services: CM Consult Post Acute Care Choice: Home Health Living arrangements for the past 2 months: Single Family Home                 DME Arranged: N/A         HH Arranged: RN, PT, OT HH Agency: [facility] Date [facility] Agency Contacted: 09/25/23 Time HH Agency Contacted: 1209 Representative spoke with at [facility] Agency: Kandee Keen  Prior Living Arrangements/Services Living arrangements for the past 2 months: Single Family Home Lives with:: Self Patient language and need for interpreter reviewed:: Yes Do you feel safe going back to the place where you live?: Yes      Need for Family Participation in Patient Care: Yes (Comment) Care giver support system in place?: Yes (comment) Current home services: DME Criminal  Activity/Legal Involvement Pertinent to Current Situation/Hospitalization: No - Comment as needed  Activities of Daily Living   ADL Screening (condition at time of admission) Does the patient have a NEW difficulty with bathing/dressing/toileting/self-feeding that is expected to last >3 days?: Yes (Initiates electronic notice to provider for possible OT consult) Does the patient have a NEW difficulty with getting in/out of bed, walking, or climbing stairs that is expected to last >3 days?: Yes (Initiates electronic notice to provider for possible PT consult) (wound on the left leg) Does the patient have a NEW difficulty with communication that is expected to last >3 days?: No Is the patient deaf or have difficulty hearing?: Yes (left ear) Does the patient have difficulty seeing, even when wearing glasses/contacts?: No Does the patient have difficulty concentrating, remembering, or making decisions?: No  Permission Sought/Granted   Permission granted to share information with : Yes, Verbal Permission Granted  Share Information with NAME: daughter [name] [name]  Permission granted to share info w AGENCY: [facility]        Emotional Assessment Appearance:: Appears stated age Attitude/Demeanor/Rapport: Engaged Affect (typically observed): Accepting Orientation: : Oriented to Self, Oriented to Place, Oriented to  Time, Oriented to Situation Alcohol / Substance Use: Not Applicable Psych Involvement: No (comment)  Admission diagnosis:  Fall at home [W19.XXXA, Y92.009] Laceration of neck, initial encounter [S11.91XA] Fall, initial encounter [W19.XXXA] Patient Active Problem List   Diagnosis Date  Noted   Syncope 09/24/2023   Chronic HFrEF (heart failure with reduced ejection fraction) (HCC) 09/04/2021   Atherosclerosis of native arteries of the extremities with ulceration (HCC) 09/04/2021   Atherosclerosis of artery of extremity with rest pain (HCC) 08/30/2021   Atherosclerosis of native arteries of  extremity with intermittent claudication (HCC) 02/18/2021   Cataract cortical, senile 02/11/2021   Cor pulmonale, acute (HCC) 05/02/2020   Type 2 diabetes mellitus with peripheral angiopathy (HCC) 11/10/2018   Medicare annual wellness visit, initial 05/05/2017   Varicose veins of lower extremities with ulcer (HCC) 04/23/2017   Chronic venous insufficiency 04/23/2017   Leg pain 04/23/2017   Lymphedema 04/23/2017   A-fib (HCC) 04/23/2017   Hyperlipidemia 04/23/2017   Acquired hypothyroidism 11/03/2016   B12 deficiency 10/27/2014   CAD (coronary artery disease) 04/25/2014   GERD (gastroesophageal reflux disease) 04/25/2014   HTN (hypertension), benign 04/25/2014   S/P CABG x 3 02/26/1993   PCP:  [name], MD Pharmacy:   [id number] [place], [place] - [address] [place] [address] Phone: [phone] Fax: [phone]  [id number] [place], [place] - [address] AT [facility] [address] [place] [address] Phone: [phone] Fax: [phone]     Social Determinants of Health (SDOH) Social History: SDOH Screenings   Food Insecurity: No Food Insecurity (09/24/2023)  Housing: Patient Declined (09/24/2023)  Transportation Needs: No Transportation Needs (09/24/2023)  Utilities: Not At Risk (09/24/2023)  Financial Resource Strain: Low Risk  (09/22/2023)   Received from [facility]  Social Connections: Moderately Isolated (04/24/2022)   Received from [facility]  Tobacco Use: Low Risk  (09/24/2023)   SDOH Interventions:     Readmission Risk Interventions     No data to display

## 2023-09-25 NOTE — Plan of Care (Signed)
  Problem: Clinical Measurements: Goal: Will remain free from infection Outcome: Progressing Goal: Diagnostic test results will improve Outcome: Progressing Goal: Respiratory complications will improve Outcome: Progressing   Problem: Nutrition: Goal: Adequate nutrition will be maintained Outcome: Progressing   Problem: Elimination: Goal: Will not experience complications related to urinary retention Outcome: Progressing   Problem: Pain Managment: Goal: General experience of comfort will improve Outcome: Not Progressing

## 2023-09-25 NOTE — Progress Notes (Signed)
   09/25/23 1300  Mobility  Activity Ambulated with assistance in hallway  Level of Assistance Contact guard assist, steadying assist  Assistive Device Front wheel walker  Distance Ambulated (ft) 140 ft  Activity Response Tolerated well  Mobility Referral Yes  $Mobility charge 1 Mobility  Mobility Specialist Start Time (ACUTE ONLY) 1245  Mobility Specialist Stop Time (ACUTE ONLY) 1300  Mobility Specialist Time Calculation (min) (ACUTE ONLY) 15 min   Mobility Specialist: Progress Note   Pt agreeable to mobility session - received in bed. Required CG using RW.  Given verbal cues to keep walker on the ground and to "push it like a shopping cart." Asymptomatic throughout ambulation and returned to room w/o fault.  Pt returned to chair with all needs met - call bell within reach. Chair alarm on. Daughter present.   In conversation pt stated he was "89 years old."    Barnie Mort, [place] Mobility Specialist Please contact via SecureChat or Rehab office at [phone].

## 2023-09-25 NOTE — Progress Notes (Signed)
Echocardiogram 2D Echocardiogram has been performed.  [name] [name] 09/25/2023, 8:59 AM

## 2023-09-25 NOTE — Progress Notes (Signed)
   09/25/23 1155  Mobility  Activity Ambulated with assistance in hallway;Ambulated with assistance in room  Level of Assistance Contact guard assist, steadying assist (SV for Bed Mobility, MinA for STS)  Assistive Device Front wheel walker  Distance Ambulated (ft) 200 ft  Activity Response Tolerated well  Mobility Referral Yes  $Mobility charge 1 Mobility  Mobility Specialist Start Time (ACUTE ONLY) 1115  Mobility Specialist Stop Time (ACUTE ONLY) 1130  Mobility Specialist Time Calculation (min) (ACUTE ONLY) 15 min   Mobility Specialist: Progress Note  Pt agreeable to mobility session - received in bed. Required SV for bed mobility, MinA STS, CG using RW.  Pt with c/o slight discomfort in L ankle otherwise asymptomatic throughout ambulation and returned to room w/o fault.  Pt returned to chair with all needs met - call bell within reach. Chair alarm on.   Barnie Mort, BS Mobility Specialist Please contact via SecureChat or Rehab office at [phone].

## 2023-09-25 NOTE — Progress Notes (Signed)
HD#1 Subjective:   Summary: [name] [name]. Suddreth is a 89 y.o. M with PMHx atrial fibrillation on eliquis, MI s/p CABG, ischemic cardiomyopathy, HFrEF (LV EF 30%), hypotension on midodrine, hypothyroidism, cancer of head/neck who presents after experiencing a fall.   Overnight Events: mr langenbach had continued bleeding from neck laceration. Silver nitrate and steri-strips applied and new sterile dressing. Reported L ankle pain and started on ibuprofen 800 mg once and tylenol PRN. Continuing to hold eliquis  Pt reports that he does not remember the events leading up to the fall and just remembers calling his family members following the fall. His daughter is at bedside and reports that he has had hypoglcemia: once in recent past in which EMS documented hypoglycemia and report the day prior to hosp that he "appeared" hypoglycemia. Daughter reports that he takes midodrine 2.5 bid and is adherent.   Shared decision maker regarding next steps for Afib anticoagulation was engaged with family. He discussed that anticoagulation is indicated esp given he has current irregularly irregular rhythm. He only meets 1/3 criteria for low dose eliquis. Evidence is mixed on low dose eliquis in pt who don't meet low dose criteria but are high fall risk. Given he is borderline for 2/3 criteria (weight just above cutoff) and family/pt was to avoid stroke>bleeding risk, we are opting for eliquis 2.5 mg twice daily. We will restart this after stable hgb today.   Regarding his ulcer, he follows up extensively outpatient for this. He has an appointment scheduled for Tuesday. Discussed that there is low suspicion that he has blood infection secondary to ulcer.    Objective:  Vital signs in last 24 hours: borderline hypotensive on 84/40, MAPS low mid 60s, on midodrine 2.5 mg, otherwise normal. Orthostatic vitals: laying 104/45, Sitting 104/63, Standing 111/54  Vitals:   09/25/23 0227 09/25/23 0948 09/25/23 1212 09/25/23 1455   BP: (!) 103/51 (!) 92/41 (!) 95/48 108/60  Pulse: 72 76 79 82  Resp: 16 16 18 18   Temp: 97.7 F (36.5 C) 98.1 F (36.7 C) 98.3 F (36.8 C) 97.7 F (36.5 C)  TempSrc: Oral Oral    SpO2: 96% 97% 96% 91%  Weight:      Height:        Filed Weights   09/24/23 1006  Weight: 61.7 kg    Intake/Output Summary (Last 24 hours) at 09/25/2023 1507 Last data filed at 09/25/2023 1300 Gross per 24 hour  Intake 1109.41 ml  Output 725 ml  Net 384.41 ml   Net IO Since Admission: 384.41 mL [09/25/23 1507]   Physical Exam:  Physical Exam Constitutional:      General: He is not in acute distress. HENT:     Head: Normocephalic and atraumatic.  Neck:     Comments: L neck skin tears currently non bleeding with steri strips. Surrounding echymosis.  Cardiovascular:     Rate and Rhythm: Normal rate and regular rhythm.  Pulmonary:     Effort: Pulmonary effort is normal.     Breath sounds: Normal breath sounds.  Abdominal:     General: Bowel sounds are normal.     Tenderness: There is no abdominal tenderness.  Skin:    Comments: 3x3 cm nonpurulent nonhealing ulcer over left lateral foot. Tender with dressing and especially tender with compression wrapping.   Neurological:     Mental Status: He is alert.    Note image is flipped over horizontal plane. This is lateral left foot ulcer although it does not  appear.   Labs & Imaging:   Pertinent Labs: CBC pending WBC 17.5->17.5 Hgb 9.6->10.2->9.0  CMP pending K 3.2->3.2 BUN 31->29 Cr 1.2->1.15 (at baseline)  Tbili 1.8->1.3  Imaging: ABI pending  TTE LVEF 35-40%, LV grade II diastolic dysfunction, RV systolic mildly reduced systolic fxn, severely elevated pulm artery SBP, L+R atrial enlargement.  (Past 2D echocardiogram 08/27/2021 revealed LVEF 30%, moderate right ventricular systolic dysfunction, moderate mitral regurgitation, severe tricuspid regurgitation)  Assessment/Plan:   Principal Problem:   Syncope Active Problems:    Chronic venous insufficiency   A-fib (HCC)   CAD (coronary artery disease)   Type 2 diabetes mellitus with peripheral angiopathy (HCC)   Chronic HFrEF (heart failure with reduced ejection fraction) [facility])   Patient Summary: [name] [name]. [name] [name] is a 89 y.o. M with PMHx atrial fibrillation on eliquis, MI s/p CABG, ischemic cardiomyopathy, HFrEF (LV EF 30%), hypotension on midodrine, hypothyroidism, cancer of head/neck who presents after experiencing a fall.   #Syncope v mechanical fall #Chronic hypotension On eliquis but no acute head blead. Syncope secondary orthostatic hypotension v mechanical fall. Could be related to hypoglycemia, which he has Hx of. Cardiogenic ruled down with only chronic Afib w/o RVR on telemetry and echo with improvement from last year. PT/OT recommend HH, TOC coordinated. Adherent to midodrine per daughter.  Plan: -encourage PO intake -plan to increase midodrine from 2.5 -> 5 twice daily.  -encourage family to get glucometer to rule out hypoglycemia when symptomatic in future and discussed giving juice if concerned   #Chronic venous insufficiency #Subacute venous stasis ulcer of L lateral malleolus S/p angioplasty and stent placement to L superficial femoral and popliteal arteries in 2022. Negative LLE DVT PTA. Stable leukocytosis and afebrile.  Plan: -continue wound dressing and avoid pressure wrapping due to pain -outpatient f/u on Tuesday  #Atrial fibrillation on eliquis Rate controlled with irregular rhythm on exam. CHADVASC 5. Only meets 1/3 criteria for low dose Eliquis (age >1 but is borderline for weight). Evidence is mixed on off label low dose eliquis for pt with Afib and high fall risk but not meeting 2/3 criteria for low dose. Given he is borderline for weight to meet criteria for low dose and his greater fall risk w/ past falls + hypotension, we are opting for eliquis 2.5 bid versus 5 mg or no anticoagulation (irregularly irregular reate.   Plan: -Telemetry monitoring -restarting eliquis 2.5 mg twice daily per shared decision making with family -Hold metoprolol 6.25 mg daily given mild hypotension, plan to restart in morning   #Acute blood loss, stable #Acute left neck skin tear #Chronic macrocytic anemia #Chronic vitamin B12 deficiency #Chronic Thrombocytopenia Hgb stable 9-10 (baseline 11-12).  Plan: -Trend CBC -Continue home vitamin B12 1000 mcg daily -Continue home ferrous sulfate 325 mg daily -restarted eliquis with stable bleeding -steri strips for continued minor bleeding from skin tear   #Acute Hypokalemia No change w/ KCL 60 mEq repletion PO. K 3.2. -repeat repletion w/ 40 mEq x2 doses -Repeat am BMP.   #Isolated hyperbilirubinemia, resolving Repeat CMP am.    #Prior MI s/p CABG 1994 #Ischemic cardiomyopathy #HFrEF LV EF 35-40% #Hyperlipidemia LVEF improved from 30% last year to 35-40%.  Plan: -Hold home torsemide 20 mg BID until tomorrow morning -Continue home simvastatin 40 mg daily   #Hypothyroidism Plan: -Continue home levothyroxine 112 mcg daily   #GERD Plan: -Continue home omeprazole 20 mg daily   Diet: Heart Healthy IVF: None VTE: restarted eliquis Code: Comfort Care PT/OT recs: Home Health, walker. TOC  recs: none Family Update: daughter at bedside 9/27   Dispo: Anticipated discharge to Home tomorrow pending ABI, tolerating eliquis, starting midodrine.   Meryl Dare, MD PGY-1 Psych Resident Contact: secure message or page [phone] Please contact the on call pager after 5 pm and on weekends at [phone].

## 2023-09-25 NOTE — Progress Notes (Signed)
Physical Therapy Treatment Patient Details Name: [name] [name] MRN: [id number] DOB: 01-Dec-1929 Today's Date: 09/25/2023   History of Present Illness Pt is 89 yo presenting to [facility] following fall at home hitting his neck/face after sliding out of bed. Pt also recently had unna boot placed that was too tight; pt removed and now has some swelling in the L foot. PMH: A fib on eliquis, MI s/p CABG, ischemic cardiomyopathy, HFEF, Hypotension, Hypothyroidism, cx of head/neck.    PT Comments  The pt was agreeable to session with focus on progressing gait and independence with transfers. This session limited by arrival of vascular to complete ABIs, but plan to return after to further progress mobility. The pt needed minA and repeated cues for hand placement to complete sit-stand from recliner and demos poor eccentric lower to EOB despite assist. Will benefit from skilled PT to progress functional power to complete these transfers with better independence and safety.     If plan is discharge home, recommend the following: A little help with walking and/or transfers;Assist for transportation;Help with stairs or ramp for entrance;Assistance with cooking/housework   Can travel by private vehicle        Equipment Recommendations  None recommended by PT    Recommendations for Other Services       Precautions / Restrictions Precautions Precautions: Fall Precaution Comments: soft BP Restrictions Weight Bearing Restrictions: No     Mobility  Bed Mobility Overal bed mobility: Needs Assistance Bed Mobility: Sit to Supine       Sit to supine: Min assist   General bed mobility comments: minA to manage trunk and reposition, pt able to bring LE into bed without assistance    Transfers Overall transfer level: Needs assistance Equipment used: Rolling walker (2 wheels)   Sit to Stand: Min assist           General transfer comment: minA to rise from recliner. cues for hand placement.  blocking of bilateral feet as his slippers slide when he attempts to stand. poor eccentric lower    Ambulation/Gait Ambulation/Gait assistance: Contact guard assist Gait Distance (Feet): 5 Feet Assistive device: Rolling walker (2 wheels) Gait Pattern/deviations: Step-through pattern, Trunk flexed, Decreased stride length Gait velocity: decreased Gait velocity interpretation: <1.31 ft/sec, indicative of household ambulator   General Gait Details: mod cues for positioning and directing turns. pt with poor awareness of lines, limited at this time due to arrival of vascular to do ABI   Stairs             Wheelchair Mobility     Tilt Bed    Modified Rankin (Stroke Patients Only)       Balance Overall balance assessment: Needs assistance Sitting-balance support: Single extremity supported Sitting balance-Leahy Scale: Fair Sitting balance - Comments: stable at EOB   Standing balance support: Bilateral upper extremity supported, Reliant on assistive device for balance Standing balance-Leahy Scale: Poor Standing balance comment: able to stand supported at sink, not able to tolerate challenge, loss of balance with standing, decreased ability to reach or lean                            Cognition Arousal: Alert Behavior During Therapy: [facility] for tasks assessed/performed Overall Cognitive Status: Within Functional Limits for tasks assessed  General Comments: pt HOH but following commands when he was able to hear instructions. needed multiple repetitions        Exercises      General Comments General comments (skin integrity, edema, etc.): VSS on RA, BP soft but stable      Pertinent Vitals/Pain Pain Assessment Pain Assessment: 0-10 Pain Score: 0-No pain Pain Intervention(s): Monitored during session     PT Goals (current goals can now be found in the care plan section) Acute Rehab PT Goals Patient Stated  Goal: to return home PT Goal Formulation: With patient/family Time For Goal Achievement: 10/08/23 Potential to Achieve Goals: Good Progress towards PT goals: Progressing toward goals    Frequency    Min 1X/week       AM-PAC PT "6 Clicks" Mobility   Outcome Measure  Help needed turning from your back to your side while in a flat bed without using bedrails?: A Little Help needed moving from lying on your back to sitting on the side of a flat bed without using bedrails?: A Little Help needed moving to and from a bed to a chair (including a wheelchair)?: A Little Help needed standing up from a chair using your arms (e.g., wheelchair or bedside chair)?: A Little Help needed to walk in hospital room?: A Little Help needed climbing 3-5 steps with a railing? : A Little 6 Click Score: 18    End of Session Equipment Utilized During Treatment: Gait belt Activity Tolerance: Patient tolerated treatment well Patient left: in bed;with call bell/phone within reach;with family/visitor present (with vascular for ABIs) Nurse Communication: Mobility status PT Visit Diagnosis: Other abnormalities of gait and mobility (R26.89);Unsteadiness on feet (R26.81);History of falling (Z91.81);Muscle weakness (generalized) (M62.81)     Time: 1349-1400 PT Time Calculation (min) (ACUTE ONLY): 11 min  Charges:    $Therapeutic Activity: 8-22 mins PT General Charges $$ ACUTE PT VISIT: 1 Visit                     Vickki Muff, PT, DPT   Acute Rehabilitation Department Office [phone] Secure Chat Communication Preferred   Ronnie Derby 09/25/2023, 3:21 PM

## 2023-09-25 NOTE — Progress Notes (Signed)
ABI's have been completed. Preliminary results can be found in CV Proc through chart review.   09/25/23 3:15 PM Olen Cordial RVT

## 2023-09-25 NOTE — Progress Notes (Signed)
   09/25/23 1624  Mobility  Activity Transferred from chair to bed  Level of Assistance Contact guard assist, steadying assist  Assistive Device Front wheel walker  Distance Ambulated (ft) 5 ft  Activity Response Tolerated well  Mobility Referral Yes  $Mobility charge 1 Mobility  Mobility Specialist Start Time (ACUTE ONLY) 1510  Mobility Specialist Stop Time (ACUTE ONLY) 1523  Mobility Specialist Time Calculation (min) (ACUTE ONLY) 13 min   Mobility Specialist: Progress Note  Pt agreeable to mobility session - received in chair. Required CG using RW. Pt transferred with no complaints, asymptomatic throughout. Pt returned to bed with all needs met - call bell within reach. RN present. Daughter present.   [name] [name], BS Mobility Specialist Please contact via SecureChat or Rehab office at [phone].

## 2023-09-25 NOTE — Progress Notes (Addendum)
Physical Therapy Treatment Patient Details Name: [name] [name] MRN: [id number] DOB: Jul 12, 1929 Today's Date: 09/25/2023   History of Present Illness Pt is 89 yo presenting to [facility] following fall at home hitting his neck/face after sliding out of bed. Pt also recently had unna boot placed that was too tight; pt removed and now has some swelling in the L foot. PMH: A fib on eliquis, MI s/p CABG, ischemic cardiomyopathy, HFEF, Hypotension, Hypothyroidism, cx of head/neck.    PT Comments  The pt was agreeable to session with focus on progressing endurance and stability, tolerating mobility well as this is his 3rd walk of the day. The pt continues to need minA to power up to standing and minA at times to steady/manage RW with transfers and gait, but was able to complete 2 x 75 ft walking with VSS and single standing rest break. Continue to recommend skilled PT to progress dynamic stability, LE power, and endurance to improve independence and safety with mobility at home.     If plan is discharge home, recommend the following: A little help with walking and/or transfers;Assist for transportation;Help with stairs or ramp for entrance;Assistance with cooking/housework   Can travel by private vehicle        Equipment Recommendations  None recommended by PT    Recommendations for Other Services       Precautions / Restrictions Precautions Precautions: Fall Precaution Comments: soft BP Restrictions Weight Bearing Restrictions: No     Mobility  Bed Mobility Overal bed mobility: Needs Assistance Bed Mobility: Supine to Sit     Supine to sit: Min assist, HOB elevated Sit to supine: Min assist   General bed mobility comments: minA and increased time with HOB elevated. pt able to move LE to EOB but increased time and effort to scoot hips    Transfers Overall transfer level: Needs assistance Equipment used: Rolling walker (2 wheels) Transfers: Sit to/from Stand Sit to Stand: Min  assist           General transfer comment: minA to rise from EOB, still needing assist to block feet due to slippers with poor traction    Ambulation/Gait Ambulation/Gait assistance: Contact guard assist Gait Distance (Feet): 75 Feet (+ 75 ft) Assistive device: Rolling walker (2 wheels) Gait Pattern/deviations: Step-through pattern, Trunk flexed, Decreased stride length Gait velocity: decreased Gait velocity interpretation: <1.31 ft/sec, indicative of household ambulator   General Gait Details: poor clearance of feet due to slippers with poor fit/loose. The pt was able to complete but benefits from standing rest break prior to returning to room. cues and assist to manage around obstacles, increased assist with turning   Stairs             Wheelchair Mobility     Tilt Bed    Modified Rankin (Stroke Patients Only)       Balance Overall balance assessment: Needs assistance Sitting-balance support: Single extremity supported Sitting balance-Leahy Scale: Fair Sitting balance - Comments: stable at EOB   Standing balance support: Bilateral upper extremity supported, Reliant on assistive device for balance Standing balance-Leahy Scale: Poor Standing balance comment: able to stand supported at sink, not able to tolerate challenge, loss of balance with standing, decreased ability to reach or lean                            Cognition Arousal: Alert Behavior During Therapy: [facility] for tasks assessed/performed Overall Cognitive Status: Within Functional Limits for  tasks assessed                                 General Comments: pt HOH but following commands when he was able to hear instructions. needed multiple repetitions        Exercises Other Exercises Other Exercises: walking marches to mimic stair stepping. pt tolerated well but slippers not staying on his feet well were limiting factor for continuing    General Comments General comments  (skin integrity, edema, etc.): open area on L sid eof neck with some drainage, RN visualized and states she prefers to leave it open to air      Pertinent Vitals/Pain Pain Assessment Pain Assessment: 0-10 Pain Score: 0-No pain Pain Intervention(s): Monitored during session, Limited activity within patient's tolerance     PT Goals (current goals can now be found in the care plan section) Acute Rehab PT Goals Patient Stated Goal: to return home PT Goal Formulation: With patient/family Time For Goal Achievement: 10/08/23 Potential to Achieve Goals: Good Progress towards PT goals: Progressing toward goals    Frequency    Min 1X/week       AM-PAC PT "6 Clicks" Mobility   Outcome Measure  Help needed turning from your back to your side while in a flat bed without using bedrails?: A Little Help needed moving from lying on your back to sitting on the side of a flat bed without using bedrails?: A Little Help needed moving to and from a bed to a chair (including a wheelchair)?: A Little Help needed standing up from a chair using your arms (e.g., wheelchair or bedside chair)?: A Little Help needed to walk in hospital room?: A Little Help needed climbing 3-5 steps with a railing? : A Little 6 Click Score: 18    End of Session Equipment Utilized During Treatment: Gait belt Activity Tolerance: Patient tolerated treatment well Patient left: in bed;with call bell/phone within reach;with family/visitor present (with vascular for ABIs) Nurse Communication: Mobility status PT Visit Diagnosis: Other abnormalities of gait and mobility (R26.89);Unsteadiness on feet (R26.81);History of falling (Z91.81);Muscle weakness (generalized) (M62.81)     Time: 6578-4696 PT Time Calculation (min) (ACUTE ONLY): 12 min  Charges:    $Gait Training: 8-22 mins $Therapeutic Activity: 8-22 mins PT General Charges $$ ACUTE PT VISIT: 1 Visit                     Vickki Muff, PT, DPT   Acute  Rehabilitation Department Office [phone] Secure Chat Communication Preferred   Ronnie Derby 09/25/2023, 3:33 PM

## 2023-09-25 NOTE — TOC CAGE-AID Note (Signed)
Transition of Care [facility]) - CAGE-AID Screening   Patient Details  Name: [name] [name] MRN: [id number] Date of Birth: March 06, 1929  Hewitt Shorts, RN Trauma Response Nurse Phone Number: [phone] 09/25/2023, 11:45 AM   CAGE-AID Screening:    Have You Ever Felt You Ought to Cut Down on Your Drinking or Drug Use?: No Have People Annoyed You By Critizing Your Drinking Or Drug Use?: No Have You Felt Bad Or Guilty About Your Drinking Or Drug Use?: No Have You Ever Had a Drink or Used Drugs First Thing In The Morning to Steady Your Nerves or to Get Rid of a Hangover?: No CAGE-AID Score: 0  Substance Abuse Education Offered: No

## 2023-09-26 ENCOUNTER — Inpatient Hospital Stay (HOSPITAL_COMMUNITY): Payer: Medicare HMO

## 2023-09-26 ENCOUNTER — Other Ambulatory Visit (HOSPITAL_COMMUNITY): Payer: Self-pay

## 2023-09-26 DIAGNOSIS — I5020 Unspecified systolic (congestive) heart failure: Secondary | ICD-10-CM

## 2023-09-26 DIAGNOSIS — W19XXXA Unspecified fall, initial encounter: Secondary | ICD-10-CM

## 2023-09-26 LAB — CBC WITH DIFFERENTIAL/PLATELET
Abs Immature Granulocytes: 0.14 10*3/uL — ABNORMAL HIGH (ref 0.00–0.07)
Basophils Absolute: 0 10*3/uL (ref 0.0–0.1)
Basophils Relative: 0 %
Eosinophils Absolute: 0.1 10*3/uL (ref 0.0–0.5)
Eosinophils Relative: 1 %
HCT: 26.1 % — ABNORMAL LOW (ref 39.0–52.0)
Hemoglobin: 8.8 g/dL — ABNORMAL LOW (ref 13.0–17.0)
Immature Granulocytes: 1 %
Lymphocytes Relative: 9 %
Lymphs Abs: 1.4 10*3/uL (ref 0.7–4.0)
MCH: 34.8 pg — ABNORMAL HIGH (ref 26.0–34.0)
MCHC: 33.7 g/dL (ref 30.0–36.0)
MCV: 103.2 fL — ABNORMAL HIGH (ref 80.0–100.0)
Monocytes Absolute: 1 10*3/uL (ref 0.1–1.0)
Monocytes Relative: 6 %
Neutro Abs: 13.3 10*3/uL — ABNORMAL HIGH (ref 1.7–7.7)
Neutrophils Relative %: 83 %
Platelets: 117 10*3/uL — ABNORMAL LOW (ref 150–400)
RBC: 2.53 MIL/uL — ABNORMAL LOW (ref 4.22–5.81)
RDW: 20.1 % — ABNORMAL HIGH (ref 11.5–15.5)
WBC: 16 10*3/uL — ABNORMAL HIGH (ref 4.0–10.5)
nRBC: 0 % (ref 0.0–0.2)

## 2023-09-26 LAB — CBC
HCT: 28.8 % — ABNORMAL LOW (ref 39.0–52.0)
Hemoglobin: 9.9 g/dL — ABNORMAL LOW (ref 13.0–17.0)
MCH: 36.7 pg — ABNORMAL HIGH (ref 26.0–34.0)
MCHC: 34.4 g/dL (ref 30.0–36.0)
MCV: 106.7 fL — ABNORMAL HIGH (ref 80.0–100.0)
Platelets: 110 10*3/uL — ABNORMAL LOW (ref 150–400)
RBC: 2.7 MIL/uL — ABNORMAL LOW (ref 4.22–5.81)
RDW: 20.3 % — ABNORMAL HIGH (ref 11.5–15.5)
WBC: 17 10*3/uL — ABNORMAL HIGH (ref 4.0–10.5)
nRBC: 0 % (ref 0.0–0.2)

## 2023-09-26 LAB — COMPREHENSIVE METABOLIC PANEL
ALT: 29 U/L (ref 0–44)
AST: 49 U/L — ABNORMAL HIGH (ref 15–41)
Albumin: 2.7 g/dL — ABNORMAL LOW (ref 3.5–5.0)
Alkaline Phosphatase: 73 U/L (ref 38–126)
Anion gap: 12 (ref 5–15)
BUN: 29 mg/dL — ABNORMAL HIGH (ref 8–23)
CO2: 22 mmol/L (ref 22–32)
Calcium: 8.6 mg/dL — ABNORMAL LOW (ref 8.9–10.3)
Chloride: 98 mmol/L (ref 98–111)
Creatinine, Ser: 1.06 mg/dL (ref 0.61–1.24)
GFR, Estimated: >60 mL/min (ref >60)
Glucose, Bld: 115 mg/dL — ABNORMAL HIGH (ref 70–99)
Potassium: 4.4 mmol/L (ref 3.5–5.1)
Sodium: 132 mmol/L — ABNORMAL LOW (ref 135–145)
Total Bilirubin: 1.2 mg/dL (ref 0.3–1.2)
Total Protein: 6.1 g/dL — ABNORMAL LOW (ref 6.5–8.1)

## 2023-09-26 LAB — VAS US ABI WITH/WO TBI

## 2023-09-26 MED ORDER — SULFAMETHOXAZOLE-TRIMETHOPRIM 800-160 MG PO TABS
1.0000 | ORAL_TABLET | Freq: Two times a day (BID) | ORAL | 0 refills | Status: DC
Start: 2023-09-26 — End: 2023-09-30
  Filled 2023-09-26: qty 12, 6d supply, fill #0

## 2023-09-26 MED ORDER — SULFAMETHOXAZOLE-TRIMETHOPRIM 800-160 MG PO TABS
1.0000 | ORAL_TABLET | Freq: Two times a day (BID) | ORAL | Status: DC
  Administered 2023-09-26 – 2023-09-27 (×3): 1 via ORAL
  Filled 2023-09-26 (×3): qty 1

## 2023-09-26 NOTE — Discharge Instructions (Signed)
Hi [name] [name] (or [name] [name]),   Take medications per discharge paperwork Pick up new midodrine prescription from your pharmacy.  Amoxicillin should be brought to you at discharge by your nurse. Take per instructions until all gone.  Follow up with wound care and PCP per your current appointments that [facility] scheduled.  Any fevers, chills, night sweats, reach out to wounds care and/or PCP. Or go to emergency department  Take care,  Internal Medicine Teaching Service

## 2023-09-26 NOTE — Progress Notes (Incomplete)
  Discussed with the resident and agree with the resident's findings and plan of care as documented in the resident's note  89 yo M s/p fall suspected due to orthostatic hypotension. Now on midodrine.  Re-evaluating eliquis use (for afib), currently on hold.

## 2023-09-26 NOTE — Evaluation (Signed)
Physical Therapy Re-Evaluation Patient Details Name: [name] [name] MRN: [id number] DOB: 07/07/1929 Today's Date: 09/26/2023  History of Present Illness  Pt is 89 yo presenting to [facility] following fall at home hitting his neck/face after sliding out of bed. Pt also recently had unna boot placed that was too tight; pt removed and now has some swelling in the L foot. PMH: A fib on eliquis, MI s/p CABG, ischemic cardiomyopathy, HFEF, Hypotension, Hypothyroidism, cx of head/neck.   Clinical Impression  The pt was seen again due to concern for decline in mobility today when working with mobility tech. He needed significantly more assistance today (from minA for bed mobility and standing and walking 75 ft to maxA and pt unable to achieve full stand despite multiple attempts). He was also markedly more confused, not oriented to place, time, or situation despite repeatedly being re-oriented through the session. At times he even offered nonsensical answers questions regarding month and year by answering with words that were neither numbers or the names of months. He needed frequent reminders of what he was doing and demos little carryover within the session.   Given significant decline since yesterday and need for significant assistance, I no longer feel it is safe for the pt to return home alone, and even though his daughter can provide assistance PRN, I feel he needs more assistance than they can provide. D/c recommendations updated to reflect this decline in mobility., pt currently needing continued inpatient rehab <3 hours/day. To return home instead, I recommend hospital bed, lift, WC, and max aides to reduce burden on caregivers.       If plan is discharge home, recommend the following: Assist for transportation;Help with stairs or ramp for entrance;Two people to help with walking and/or transfers;A lot of help with bathing/dressing/bathroom;Assistance with cooking/housework;Direct supervision/assist for  medications management;Direct supervision/assist for financial management;Supervision due to cognitive status   Can travel by private vehicle   No    Equipment Recommendations Rolling walker (2 wheels);Wheelchair (measurements PT);Wheelchair cushion (measurements PT)  Recommendations for Other Services       Functional Status Assessment Patient has had a recent decline in their functional status and demonstrates the ability to make significant improvements in function in a reasonable and predictable amount of time.     Precautions / Restrictions Precautions Precautions: Fall Precaution Comments: soft BP Restrictions Weight Bearing Restrictions: No      Mobility  Bed Mobility Overal bed mobility: Needs Assistance Bed Mobility: Supine to Sit, Sit to Supine     Supine to sit: HOB elevated, Max assist Sit to supine: Max assist   General bed mobility comments: increased assist from minA yesterday with maxA and max cues today. pt needing assist with both legs, and mod-maxA at trunk to pull to sitting, assist to move hips to EOB. pt unable to state goal of "sitting EOB" despite being told numerous times    Transfers Overall transfer level: Needs assistance Equipment used: Rolling walker (2 wheels) Transfers: Sit to/from Stand Sit to Stand: Max assist, From elevated surface           General transfer comment: maxA to rise from EOB but pt unable to maintain upright, strong posterior lean, max cues to maintain RW on ground as pt lifting and tilting rendering the RW useless. little to no wt shift forwards, pt dependent on therapist to lift off of bed. attemtped x5 without change in pt performance    Ambulation/Gait  General Gait Details: deferred as pt unable to rise to standing safely this afternoon    Balance Overall balance assessment: Needs assistance Sitting-balance support: Single extremity supported Sitting balance-Leahy Scale: Poor Sitting  balance - Comments: pt falling backwards with attempt to move LE while sitting EOB Postural control: Posterior lean Standing balance support: Bilateral upper extremity supported, Reliant on assistive device for balance Standing balance-Leahy Scale: Zero Standing balance comment: dependent on BUE support and therapist, pt falling backwards and lifting RW despite maxA                             Pertinent Vitals/Pain Pain Assessment Pain Assessment: Faces Pain Score: 0-No pain Faces Pain Scale: Hurts little more Pain Location: bilateral feet with movement Pain Descriptors / Indicators: Grimacing Pain Intervention(s): Limited activity within patient's tolerance, Monitored during session, Repositioned    Home Living Family/patient expects to be discharged to:: Private residence Living Arrangements: Alone Available Help at Discharge: Family;Available PRN/intermittently Type of Home: House Home Access: Stairs to enter Entrance Stairs-Rails: Right Entrance Stairs-Number of Steps: 3   Home Layout: One level Home Equipment: Standard Walker;Cane - single point;Shower seat Additional Comments: Daughter states she will get an Landscape architect as needed.    Prior Function Prior Level of Function : Independent/Modified Independent;Driving;History of Falls (last six months)             Mobility Comments: uses RW sometimes other times Cane and furniture walking. pt has had 2 falls in 6 months. ADLs Comments: Pt sits on the toilet and sponge bathes and has done this his entire life. Pt cooks simple meals mostly but sometimes more complicated stuff, drives, gardens. His daughters help with cleaning and pills.     Extremity/Trunk Assessment   Upper Extremity Assessment Upper Extremity Assessment: Generalized weakness;Difficult to assess due to impaired cognition    Lower Extremity Assessment Lower Extremity Assessment: Generalized weakness;RLE deficits/detail;LLE deficits/detail RLE  Deficits / Details: able to move against gravity, poor functional power and coordination RLE Coordination: decreased fine motor;decreased gross motor LLE Deficits / Details: able to move against gravity, poor functional power and coordination LLE Coordination: decreased fine motor;decreased gross motor    Cervical / Trunk Assessment Cervical / Trunk Assessment: Kyphotic;Other exceptions Cervical / Trunk Exceptions: pt daughter states he has scoliosis  Communication   Communication Communication: Hearing impairment Cueing Techniques: Verbal cues;Gestural cues;Visual cues  Cognition Arousal: Alert Behavior During Therapy: WFL for tasks assessed/performed Overall Cognitive Status: Impaired/Different from baseline Area of Impairment: Orientation, Memory, Following commands, Safety/judgement, Problem solving                 Orientation Level: Disoriented to, Place, Time, Situation   Memory: Decreased recall of precautions, Decreased short-term memory Following Commands: Follows one step commands inconsistently, Follows one step commands with increased time Safety/Judgement: Decreased awareness of safety, Decreased awareness of deficits   Problem Solving: Slow processing, Decreased initiation, Difficulty sequencing, Requires verbal cues General Comments: pt with marked difficulty in following commands this session needing frequent repetition of instructions due to poor attention and pt becoming distracted after a few seconds. pt then unable to state what we had been trying to do/move. The pt was also disoriented to place, time, and situation despite being re-oriented multiple times in session, contines to state he was at home and when asked year and month he gave non-sensical answers that were neither numbers or any of the months.  General Comments General comments (skin integrity, edema, etc.): pt with marked decline since ysterday afternoon, no family present this  afternoon.        Assessment/Plan    PT Assessment Patient needs continued PT services  PT Problem List Decreased strength;Decreased mobility;Decreased safety awareness;Decreased balance       PT Treatment Interventions DME instruction;Therapeutic exercise;Gait training;Balance training;Stair training;Neuromuscular re-education;Functional mobility training;Therapeutic activities;Patient/family education    PT Goals (Current goals can be found in the Care Plan section)  Acute Rehab PT Goals Patient Stated Goal: to return home PT Goal Formulation: With patient/family Time For Goal Achievement: 10/10/23 Potential to Achieve Goals: Fair    Frequency Min 1X/week        AM-PAC PT "6 Clicks" Mobility  Outcome Measure Help needed turning from your back to your side while in a flat bed without using bedrails?: A Lot Help needed moving from lying on your back to sitting on the side of a flat bed without using bedrails?: A Lot Help needed moving to and from a bed to a chair (including a wheelchair)?: Total Help needed standing up from a chair using your arms (e.g., wheelchair or bedside chair)?: Total Help needed to walk in hospital room?: Total Help needed climbing 3-5 steps with a railing? : Total 6 Click Score: 8    End of Session Equipment Utilized During Treatment: Gait belt Activity Tolerance: Other (comment) (confusion, weakness) Patient left: in bed;with call bell/phone within reach (with vascular for ABIs) Nurse Communication: Mobility status PT Visit Diagnosis: Other abnormalities of gait and mobility (R26.89);Unsteadiness on feet (R26.81);History of falling (Z91.81);Muscle weakness (generalized) (M62.81)    Time: 1610-9604 PT Time Calculation (min) (ACUTE ONLY): 24 min   Charges:   PT Evaluation $PT Re-evaluation: 1 Re-eval PT Treatments $Therapeutic Exercise: 8-22 mins PT General Charges $$ ACUTE PT VISIT: 1 Visit         Vickki Muff, PT, DPT   Acute  Rehabilitation Department Office [phone] Secure Chat Communication Preferred  Ronnie Derby 09/26/2023, 5:06 PM

## 2023-09-26 NOTE — Progress Notes (Addendum)
   09/26/23 1217  Mobility  Activity Transferred from bed to chair  Level of Assistance Moderate assist, patient does 50-74%  Assistive Device Front wheel walker  Distance Ambulated (ft) 5 ft  Activity Response Tolerated fair  Mobility Referral Yes  $Mobility charge 1 Mobility  Mobility Specialist Start Time (ACUTE ONLY) 1140  Mobility Specialist Stop Time (ACUTE ONLY) 1210  Mobility Specialist Time Calculation (min) (ACUTE ONLY) 30 min   Mobility Specialist: Progress Note  Pre-Mobility: HR 96, BP 117/63 (78), SPO2 96 RA Post-Mobility: HR 117, BP 126/62 (75)   Pt agreeable to mobility session - received in bed. Required ModA throughout using RW. C/o generalized weakness. RN notified via Special educational needs teacher . Returned to chair with all needs met - call bell within reach. Chair alarm on. Daughter present.  Exercises (in Bed): Leg extensions x8, Ankle Pumps x8, Toe Flexion x8   Barnie Mort, BS Mobility Specialist Please contact via SecureChat or Rehab office at [phone].

## 2023-09-26 NOTE — Progress Notes (Signed)
   09/26/23 1428  Mobility  Activity Transferred from chair to bed  Level of Assistance Moderate assist, patient does 50-74% (+2)  Assistive Device Front wheel walker  Distance Ambulated (ft) 5 ft  Activity Response Tolerated fair  Mobility Referral Yes  $Mobility charge 1 Mobility  Mobility Specialist Start Time (ACUTE ONLY) 1407  Mobility Specialist Stop Time (ACUTE ONLY) 1427  Mobility Specialist Time Calculation (min) (ACUTE ONLY) 20 min   Mobility Specialist: Progress Note  Post-Mobility: HR 83, BP 120/65 (78)  Pt agreeable to mobility session - received in chair. Required ModA throughout using RW. C/o generalized weakness and slight dizziness. Pt required heavy verbal cues. Returned to bed with all needs met - call bell within reach. Bed alarm on. Daughter present.    Barnie Mort, BS Mobility Specialist Please contact via SecureChat or Rehab office at [phone].

## 2023-09-26 NOTE — Discharge Summary (Signed)
[place]                       Absent            +---------+------------------+-----+----------+-------+ +-------+-----------+-----------+------------+------------+ ABI/TBIToday's ABIToday's TBIPrevious ABIPrevious TBI +-------+-----------+-----------+------------+------------+ Right  [place]         Absent     [place]          0.68         +-------+-----------+-----------+------------+------------+ Left   [place]         Absent     [place]          0.76         +-------+-----------+-----------+------------+------------+  Summary: Right: Resting right ankle-brachial index indicates noncompressible right lower extremity arteries. Unable to obtain TBI due to absent waveforms. Left: Resting left ankle-brachial index indicates noncompressible left lower extremity arteries. Unable to obtain TBI due to absent waveforms. *See table(s) above for measurements and observations.  Electronically signed by Gerarda Fraction on 09/26/2023 at 1:35:27 PM.    Final    ECHOCARDIOGRAM COMPLETE  Result Date: 09/25/2023    ECHOCARDIOGRAM REPORT   Patient Name:   [name] [name] Date of Exam: 09/25/2023 Medical Rec #:  [id number]        Height:       68.0 in Accession #:    [id number]       Weight:       136.0 lb Date of Birth:  07-09-1929       BSA:          1.735 m Patient Age:    89 years         BP:           103/51 mmHg Patient Gender: M                HR:           65 bpm. Exam Location:  Inpatient Procedure: 2D Echo, Cardiac Doppler, Color Doppler and Intracardiac            Opacification Agent Indications:    Syncope R55  History:        Patient has no prior history of Echocardiogram examinations.                 CHF, CAD, Prior CABG,  Arrythmias:Atrial Fibrillation; Risk                 Factors:Hypertension, Diabetes and Dyslipidemia.  Sonographer:    Lucendia Herrlich RCS Referring Phys: [phone] [facility] IMPRESSIONS  1. Left ventricular ejection fraction, by estimation, is 35 to 40%. The left ventricle has moderately decreased function. The left ventricle demonstrates regional wall motion abnormalities (see scoring diagram/findings for description). Left ventricular  diastolic parameters are consistent with Grade II diastolic dysfunction (pseudonormalization).  2. Right ventricular systolic function is mildly reduced. The right ventricular size is moderately enlarged. There is severely elevated pulmonary artery systolic pressure.  3. Left atrial size was severely dilated.  4. Right atrial size was severely dilated.  5. The mitral valve is normal in structure. Mild mitral valve regurgitation. No evidence of mitral stenosis.  6. The aortic valve is tricuspid. There is mild calcification of the aortic valve. There is mild thickening of the aortic valve. Aortic valve regurgitation is not visualized. No aortic stenosis is present.  7. The inferior vena cava is dilated in size with <50% respiratory variability, suggesting right atrial pressure of 15 mmHg. FINDINGS  Left Ventricle:  Name: [name] [name] MRN: [id number] DOB: September 18, 1929 89 y.o. PCP: [name], MD  Date of Admission: 09/24/2023  9:53 AM Date of Discharge: 09/30/23 Attending Physician: Dr. Criselda Peaches  Discharge Diagnosis: Principal Problem:   Syncope Active Problems:   Chronic venous insufficiency   A-fib (HCC)   CAD (coronary artery disease)   Type 2 diabetes mellitus with peripheral angiopathy (HCC)   Chronic HFrEF (heart failure with reduced ejection fraction) (HCC)   Fall   HFrEF (heart failure with reduced ejection fraction) (HCC)   Pressure injury of skin of dorsum of left foot    Discharge Medications: Allergies as of 09/30/2023       Reactions   Cephalexin Rash   Spironolactone Nausea Only   Codeine Rash, Other (See Comments)   Can not sleep   Doxycycline Rash        Medication List     STOP taking these medications    metoprolol succinate 25 MG 24 hr tablet Commonly known as: TOPROL-XL   torsemide 20 MG tablet Commonly known as: DEMADEX       TAKE these medications    acetaminophen 500 MG tablet Commonly known as: TYLENOL Take 1,000 mg by mouth 2 (two) times daily.   apixaban 2.5 MG Tabs tablet Commonly known as: ELIQUIS Take 1 tablet (2.5 mg total) by mouth 2 (two) times daily. What changed:  medication strength when to take this   Aspirin Low Dose 81 MG tablet Generic drug: aspirin EC TAKE 1 TABLET(81 MG) BY MOUTH DAILY. SWALLOW WHOLE   ferrous sulfate 325 (65 FE) MG EC tablet Take 325 mg by mouth 2 (two) times a week. Tuesday and Thursday   Fish Oil 1000 MG Caps Take 1,000 mg by mouth in the morning and at bedtime.   levothyroxine 112 MCG tablet Commonly known as: SYNTHROID Take 112 mcg by mouth daily.   magnesium hydroxide 400 MG/5ML suspension Commonly known as: MILK OF MAGNESIA Take 30 mLs by mouth daily as needed for mild constipation.   midodrine 5 MG tablet Commonly known as: PROAMATINE Take 1 tablet (5 mg total) by mouth 3  (three) times daily with meals. What changed:  medication strength how much to take when to take this   multivitamin with minerals tablet Take 1 tablet by mouth daily.   omeprazole 20 MG capsule Commonly known as: PRILOSEC Take 20 mg by mouth daily.   polyethylene glycol 17 g packet Commonly known as: MIRALAX / GLYCOLAX Take 17 g by mouth daily. Mix one tablespoon with 8oz of your favorite juice or water every day until you are having soft formed stools. Then start taking once daily if you didn't have a stool the day before. What changed: additional instructions   potassium chloride 10 MEQ tablet Commonly known as: KLOR-CON Take 20 mEq by mouth daily.   PROBIOTIC PO Take 1 capsule by mouth daily.   simvastatin 40 MG tablet Commonly known as: ZOCOR Take 40 mg by mouth daily.   sulfamethoxazole-trimethoprim 800-160 MG tablet Commonly known as: BACTRIM DS Take 1 tablet by mouth 2 (two) times daily for 5 doses. Start with evening dose on 10/2. Complete course on evening of 10/4   Vitamin B-12 1000 MCG Subl Take 1,000 mcg by mouth daily.               Discharge Care Instructions  (From admission, onward)           Start     Ordered  EXTREMITY DOPPLER STUDY Patient Name:  ARIEON CORCORAN  Date of Exam:   09/25/2023 Medical Rec #: [id number]         Accession #:    [id number] Date of Birth: 1929-06-28        Patient Gender: M Patient Age:   72 years Exam Location:  [facility] Procedure:      VAS [place] ABI WITH/WO TBI Referring Phys: DAN FLOYD --------------------------------------------------------------------------------  Indications: Ulceration. High Risk         Hypertension, hyperlipidemia,  Diabetes, coronary artery Factors:          disease.  Comparison Study: 12/26/2021 - Right: Resting right ankle-brachial index                   indicates noncompressible right                   lower extremity arteries. The right toe-brachial index is                   normal.                    Left: Resting left ankle-brachial index indicates                   noncompressible left                   lower extremity arteries. The left toe-brachial index is                   normal. Performing Technologist: Olen Cordial RVT  Examination Guidelines: A complete evaluation includes at minimum, Doppler waveform signals and systolic blood pressure reading at the level of bilateral brachial, anterior tibial, and posterior tibial arteries, when vessel segments are accessible. Bilateral testing is considered an integral part of a complete examination. Photoelectric Plethysmograph (PPG) waveforms and toe systolic pressure readings are included as required and additional duplex testing as needed. Limited examinations for reoccurring indications may be performed as noted.  ABI Findings: +---------+------------------+-----+----------+--------+ Right    Rt Pressure (mmHg)IndexWaveform  Comment  +---------+------------------+-----+----------+--------+ Brachial 117                    triphasic          +---------+------------------+-----+----------+--------+ PTA      245               1.93 monophasic         +---------+------------------+-----+----------+--------+ DP       254               2.00 monophasic         +---------+------------------+-----+----------+--------+ [place]                       Absent             +---------+------------------+-----+----------+--------+ +---------+------------------+-----+----------+-------+ Left     Lt Pressure (mmHg)IndexWaveform  Comment +---------+------------------+-----+----------+-------+ Brachial 127                    triphasic          +---------+------------------+-----+----------+-------+ PTA      254               2.00 monophasic        +---------+------------------+-----+----------+-------+ DP       254               2.00 monophasic        +---------+------------------+-----+----------+-------+  Name: [name] [name] MRN: [id number] DOB: September 18, 1929 89 y.o. PCP: [name], MD  Date of Admission: 09/24/2023  9:53 AM Date of Discharge: 09/30/23 Attending Physician: Dr. Criselda Peaches  Discharge Diagnosis: Principal Problem:   Syncope Active Problems:   Chronic venous insufficiency   A-fib (HCC)   CAD (coronary artery disease)   Type 2 diabetes mellitus with peripheral angiopathy (HCC)   Chronic HFrEF (heart failure with reduced ejection fraction) (HCC)   Fall   HFrEF (heart failure with reduced ejection fraction) (HCC)   Pressure injury of skin of dorsum of left foot    Discharge Medications: Allergies as of 09/30/2023       Reactions   Cephalexin Rash   Spironolactone Nausea Only   Codeine Rash, Other (See Comments)   Can not sleep   Doxycycline Rash        Medication List     STOP taking these medications    metoprolol succinate 25 MG 24 hr tablet Commonly known as: TOPROL-XL   torsemide 20 MG tablet Commonly known as: DEMADEX       TAKE these medications    acetaminophen 500 MG tablet Commonly known as: TYLENOL Take 1,000 mg by mouth 2 (two) times daily.   apixaban 2.5 MG Tabs tablet Commonly known as: ELIQUIS Take 1 tablet (2.5 mg total) by mouth 2 (two) times daily. What changed:  medication strength when to take this   Aspirin Low Dose 81 MG tablet Generic drug: aspirin EC TAKE 1 TABLET(81 MG) BY MOUTH DAILY. SWALLOW WHOLE   ferrous sulfate 325 (65 FE) MG EC tablet Take 325 mg by mouth 2 (two) times a week. Tuesday and Thursday   Fish Oil 1000 MG Caps Take 1,000 mg by mouth in the morning and at bedtime.   levothyroxine 112 MCG tablet Commonly known as: SYNTHROID Take 112 mcg by mouth daily.   magnesium hydroxide 400 MG/5ML suspension Commonly known as: MILK OF MAGNESIA Take 30 mLs by mouth daily as needed for mild constipation.   midodrine 5 MG tablet Commonly known as: PROAMATINE Take 1 tablet (5 mg total) by mouth 3  (three) times daily with meals. What changed:  medication strength how much to take when to take this   multivitamin with minerals tablet Take 1 tablet by mouth daily.   omeprazole 20 MG capsule Commonly known as: PRILOSEC Take 20 mg by mouth daily.   polyethylene glycol 17 g packet Commonly known as: MIRALAX / GLYCOLAX Take 17 g by mouth daily. Mix one tablespoon with 8oz of your favorite juice or water every day until you are having soft formed stools. Then start taking once daily if you didn't have a stool the day before. What changed: additional instructions   potassium chloride 10 MEQ tablet Commonly known as: KLOR-CON Take 20 mEq by mouth daily.   PROBIOTIC PO Take 1 capsule by mouth daily.   simvastatin 40 MG tablet Commonly known as: ZOCOR Take 40 mg by mouth daily.   sulfamethoxazole-trimethoprim 800-160 MG tablet Commonly known as: BACTRIM DS Take 1 tablet by mouth 2 (two) times daily for 5 doses. Start with evening dose on 10/2. Complete course on evening of 10/4   Vitamin B-12 1000 MCG Subl Take 1,000 mcg by mouth daily.               Discharge Care Instructions  (From admission, onward)           Start     Ordered  [place]                       Absent            +---------+------------------+-----+----------+-------+ +-------+-----------+-----------+------------+------------+ ABI/TBIToday's ABIToday's TBIPrevious ABIPrevious TBI +-------+-----------+-----------+------------+------------+ Right  [place]         Absent     [place]          0.68         +-------+-----------+-----------+------------+------------+ Left   [place]         Absent     [place]          0.76         +-------+-----------+-----------+------------+------------+  Summary: Right: Resting right ankle-brachial index indicates noncompressible right lower extremity arteries. Unable to obtain TBI due to absent waveforms. Left: Resting left ankle-brachial index indicates noncompressible left lower extremity arteries. Unable to obtain TBI due to absent waveforms. *See table(s) above for measurements and observations.  Electronically signed by Gerarda Fraction on 09/26/2023 at 1:35:27 PM.    Final    ECHOCARDIOGRAM COMPLETE  Result Date: 09/25/2023    ECHOCARDIOGRAM REPORT   Patient Name:   [name] [name] Date of Exam: 09/25/2023 Medical Rec #:  [id number]        Height:       68.0 in Accession #:    [id number]       Weight:       136.0 lb Date of Birth:  07-09-1929       BSA:          1.735 m Patient Age:    89 years         BP:           103/51 mmHg Patient Gender: M                HR:           65 bpm. Exam Location:  Inpatient Procedure: 2D Echo, Cardiac Doppler, Color Doppler and Intracardiac            Opacification Agent Indications:    Syncope R55  History:        Patient has no prior history of Echocardiogram examinations.                 CHF, CAD, Prior CABG,  Arrythmias:Atrial Fibrillation; Risk                 Factors:Hypertension, Diabetes and Dyslipidemia.  Sonographer:    Lucendia Herrlich RCS Referring Phys: [phone] [facility] IMPRESSIONS  1. Left ventricular ejection fraction, by estimation, is 35 to 40%. The left ventricle has moderately decreased function. The left ventricle demonstrates regional wall motion abnormalities (see scoring diagram/findings for description). Left ventricular  diastolic parameters are consistent with Grade II diastolic dysfunction (pseudonormalization).  2. Right ventricular systolic function is mildly reduced. The right ventricular size is moderately enlarged. There is severely elevated pulmonary artery systolic pressure.  3. Left atrial size was severely dilated.  4. Right atrial size was severely dilated.  5. The mitral valve is normal in structure. Mild mitral valve regurgitation. No evidence of mitral stenosis.  6. The aortic valve is tricuspid. There is mild calcification of the aortic valve. There is mild thickening of the aortic valve. Aortic valve regurgitation is not visualized. No aortic stenosis is present.  7. The inferior vena cava is dilated in size with <50% respiratory variability, suggesting right atrial pressure of 15 mmHg. FINDINGS  Left Ventricle:  EXTREMITY DOPPLER STUDY Patient Name:  ARIEON CORCORAN  Date of Exam:   09/25/2023 Medical Rec #: [id number]         Accession #:    [id number] Date of Birth: 1929-06-28        Patient Gender: M Patient Age:   72 years Exam Location:  [facility] Procedure:      VAS [place] ABI WITH/WO TBI Referring Phys: DAN FLOYD --------------------------------------------------------------------------------  Indications: Ulceration. High Risk         Hypertension, hyperlipidemia,  Diabetes, coronary artery Factors:          disease.  Comparison Study: 12/26/2021 - Right: Resting right ankle-brachial index                   indicates noncompressible right                   lower extremity arteries. The right toe-brachial index is                   normal.                    Left: Resting left ankle-brachial index indicates                   noncompressible left                   lower extremity arteries. The left toe-brachial index is                   normal. Performing Technologist: Olen Cordial RVT  Examination Guidelines: A complete evaluation includes at minimum, Doppler waveform signals and systolic blood pressure reading at the level of bilateral brachial, anterior tibial, and posterior tibial arteries, when vessel segments are accessible. Bilateral testing is considered an integral part of a complete examination. Photoelectric Plethysmograph (PPG) waveforms and toe systolic pressure readings are included as required and additional duplex testing as needed. Limited examinations for reoccurring indications may be performed as noted.  ABI Findings: +---------+------------------+-----+----------+--------+ Right    Rt Pressure (mmHg)IndexWaveform  Comment  +---------+------------------+-----+----------+--------+ Brachial 117                    triphasic          +---------+------------------+-----+----------+--------+ PTA      245               1.93 monophasic         +---------+------------------+-----+----------+--------+ DP       254               2.00 monophasic         +---------+------------------+-----+----------+--------+ [place]                       Absent             +---------+------------------+-----+----------+--------+ +---------+------------------+-----+----------+-------+ Left     Lt Pressure (mmHg)IndexWaveform  Comment +---------+------------------+-----+----------+-------+ Brachial 127                    triphasic          +---------+------------------+-----+----------+-------+ PTA      254               2.00 monophasic        +---------+------------------+-----+----------+-------+ DP       254               2.00 monophasic        +---------+------------------+-----+----------+-------+  EXTREMITY DOPPLER STUDY Patient Name:  ARIEON CORCORAN  Date of Exam:   09/25/2023 Medical Rec #: [id number]         Accession #:    [id number] Date of Birth: 1929-06-28        Patient Gender: M Patient Age:   72 years Exam Location:  [facility] Procedure:      VAS [place] ABI WITH/WO TBI Referring Phys: DAN FLOYD --------------------------------------------------------------------------------  Indications: Ulceration. High Risk         Hypertension, hyperlipidemia,  Diabetes, coronary artery Factors:          disease.  Comparison Study: 12/26/2021 - Right: Resting right ankle-brachial index                   indicates noncompressible right                   lower extremity arteries. The right toe-brachial index is                   normal.                    Left: Resting left ankle-brachial index indicates                   noncompressible left                   lower extremity arteries. The left toe-brachial index is                   normal. Performing Technologist: Olen Cordial RVT  Examination Guidelines: A complete evaluation includes at minimum, Doppler waveform signals and systolic blood pressure reading at the level of bilateral brachial, anterior tibial, and posterior tibial arteries, when vessel segments are accessible. Bilateral testing is considered an integral part of a complete examination. Photoelectric Plethysmograph (PPG) waveforms and toe systolic pressure readings are included as required and additional duplex testing as needed. Limited examinations for reoccurring indications may be performed as noted.  ABI Findings: +---------+------------------+-----+----------+--------+ Right    Rt Pressure (mmHg)IndexWaveform  Comment  +---------+------------------+-----+----------+--------+ Brachial 117                    triphasic          +---------+------------------+-----+----------+--------+ PTA      245               1.93 monophasic         +---------+------------------+-----+----------+--------+ DP       254               2.00 monophasic         +---------+------------------+-----+----------+--------+ [place]                       Absent             +---------+------------------+-----+----------+--------+ +---------+------------------+-----+----------+-------+ Left     Lt Pressure (mmHg)IndexWaveform  Comment +---------+------------------+-----+----------+-------+ Brachial 127                    triphasic          +---------+------------------+-----+----------+-------+ PTA      254               2.00 monophasic        +---------+------------------+-----+----------+-------+ DP       254               2.00 monophasic        +---------+------------------+-----+----------+-------+  [place]                       Absent            +---------+------------------+-----+----------+-------+ +-------+-----------+-----------+------------+------------+ ABI/TBIToday's ABIToday's TBIPrevious ABIPrevious TBI +-------+-----------+-----------+------------+------------+ Right  [place]         Absent     [place]          0.68         +-------+-----------+-----------+------------+------------+ Left   [place]         Absent     [place]          0.76         +-------+-----------+-----------+------------+------------+  Summary: Right: Resting right ankle-brachial index indicates noncompressible right lower extremity arteries. Unable to obtain TBI due to absent waveforms. Left: Resting left ankle-brachial index indicates noncompressible left lower extremity arteries. Unable to obtain TBI due to absent waveforms. *See table(s) above for measurements and observations.  Electronically signed by Gerarda Fraction on 09/26/2023 at 1:35:27 PM.    Final    ECHOCARDIOGRAM COMPLETE  Result Date: 09/25/2023    ECHOCARDIOGRAM REPORT   Patient Name:   [name] [name] Date of Exam: 09/25/2023 Medical Rec #:  [id number]        Height:       68.0 in Accession #:    [id number]       Weight:       136.0 lb Date of Birth:  07-09-1929       BSA:          1.735 m Patient Age:    89 years         BP:           103/51 mmHg Patient Gender: M                HR:           65 bpm. Exam Location:  Inpatient Procedure: 2D Echo, Cardiac Doppler, Color Doppler and Intracardiac            Opacification Agent Indications:    Syncope R55  History:        Patient has no prior history of Echocardiogram examinations.                 CHF, CAD, Prior CABG,  Arrythmias:Atrial Fibrillation; Risk                 Factors:Hypertension, Diabetes and Dyslipidemia.  Sonographer:    Lucendia Herrlich RCS Referring Phys: [phone] [facility] IMPRESSIONS  1. Left ventricular ejection fraction, by estimation, is 35 to 40%. The left ventricle has moderately decreased function. The left ventricle demonstrates regional wall motion abnormalities (see scoring diagram/findings for description). Left ventricular  diastolic parameters are consistent with Grade II diastolic dysfunction (pseudonormalization).  2. Right ventricular systolic function is mildly reduced. The right ventricular size is moderately enlarged. There is severely elevated pulmonary artery systolic pressure.  3. Left atrial size was severely dilated.  4. Right atrial size was severely dilated.  5. The mitral valve is normal in structure. Mild mitral valve regurgitation. No evidence of mitral stenosis.  6. The aortic valve is tricuspid. There is mild calcification of the aortic valve. There is mild thickening of the aortic valve. Aortic valve regurgitation is not visualized. No aortic stenosis is present.  7. The inferior vena cava is dilated in size with <50% respiratory variability, suggesting right atrial pressure of 15 mmHg. FINDINGS  Left Ventricle:  [place]                       Absent            +---------+------------------+-----+----------+-------+ +-------+-----------+-----------+------------+------------+ ABI/TBIToday's ABIToday's TBIPrevious ABIPrevious TBI +-------+-----------+-----------+------------+------------+ Right  [place]         Absent     [place]          0.68         +-------+-----------+-----------+------------+------------+ Left   [place]         Absent     [place]          0.76         +-------+-----------+-----------+------------+------------+  Summary: Right: Resting right ankle-brachial index indicates noncompressible right lower extremity arteries. Unable to obtain TBI due to absent waveforms. Left: Resting left ankle-brachial index indicates noncompressible left lower extremity arteries. Unable to obtain TBI due to absent waveforms. *See table(s) above for measurements and observations.  Electronically signed by Gerarda Fraction on 09/26/2023 at 1:35:27 PM.    Final    ECHOCARDIOGRAM COMPLETE  Result Date: 09/25/2023    ECHOCARDIOGRAM REPORT   Patient Name:   [name] [name] Date of Exam: 09/25/2023 Medical Rec #:  [id number]        Height:       68.0 in Accession #:    [id number]       Weight:       136.0 lb Date of Birth:  07-09-1929       BSA:          1.735 m Patient Age:    89 years         BP:           103/51 mmHg Patient Gender: M                HR:           65 bpm. Exam Location:  Inpatient Procedure: 2D Echo, Cardiac Doppler, Color Doppler and Intracardiac            Opacification Agent Indications:    Syncope R55  History:        Patient has no prior history of Echocardiogram examinations.                 CHF, CAD, Prior CABG,  Arrythmias:Atrial Fibrillation; Risk                 Factors:Hypertension, Diabetes and Dyslipidemia.  Sonographer:    Lucendia Herrlich RCS Referring Phys: [phone] [facility] IMPRESSIONS  1. Left ventricular ejection fraction, by estimation, is 35 to 40%. The left ventricle has moderately decreased function. The left ventricle demonstrates regional wall motion abnormalities (see scoring diagram/findings for description). Left ventricular  diastolic parameters are consistent with Grade II diastolic dysfunction (pseudonormalization).  2. Right ventricular systolic function is mildly reduced. The right ventricular size is moderately enlarged. There is severely elevated pulmonary artery systolic pressure.  3. Left atrial size was severely dilated.  4. Right atrial size was severely dilated.  5. The mitral valve is normal in structure. Mild mitral valve regurgitation. No evidence of mitral stenosis.  6. The aortic valve is tricuspid. There is mild calcification of the aortic valve. There is mild thickening of the aortic valve. Aortic valve regurgitation is not visualized. No aortic stenosis is present.  7. The inferior vena cava is dilated in size with <50% respiratory variability, suggesting right atrial pressure of 15 mmHg. FINDINGS  Left Ventricle:

## 2023-09-26 NOTE — Progress Notes (Signed)
HD#2 Subjective:   Summary: [name] [name]. Gandolfo is a 89 y.o. M with PMHx atrial fibrillation on eliquis, MI s/p CABG, ischemic cardiomyopathy, HFrEF (LV EF 30%), hypotension on midodrine, hypothyroidism, cancer of head/neck who presents after experiencing a fall.   No events overnight including continued bleeding. He reports no concerns. He is eating, drinking, and sleeping okay. He does not report any significant issues other than pain from his ulcer. He does not endorse worsening pain. He denies fever, chills, night sweats. He does not endorse dizziness with standing or weakness.   Daughter is present at bedside in the afternoon. She feels that he is weaker than his baseline especially when changing position and ambulating. She would prefer if he stayed another night, which we were agreeable to. She reports that his wound care follow up is Tuesday and PCP follow up on Wednesday. She is aware we are holding his eliquis with plan to restart when he sees his PCP. She is also aware of the midodrine increase. She is aware that he will discharge with an antibiotic regimen for empiric tx of LLE cellulitis.    Objective:  Vital signs in last 24 hours: normotensive 128/61 since receiving midodrine 5, otherwise normal vitals Vitals:   09/25/23 1455 09/25/23 2018 09/26/23 0336 09/26/23 0721  BP: 108/60 (!) 101/58 128/61 (!) 122/56  Pulse: 82 81 98 99  Resp: 18 16 19 16   Temp: 97.7 F (36.5 C) 98.4 F (36.9 C) 98.5 F (36.9 C) 99.3 F (37.4 C)  TempSrc:  Oral Oral Oral  SpO2: 91% 96% 95% 97%  Weight:      Height:        Filed Weights   09/24/23 1006  Weight: 61.7 kg    Intake/Output Summary (Last 24 hours) at 09/26/2023 1419 Last data filed at 09/26/2023 0500 Gross per 24 hour  Intake 170 ml  Output 500 ml  Net -330 ml   Net IO Since Admission: 54.41 mL [09/26/23 1419]   Physical Exam:  Physical Exam Constitutional:      General: He is not in acute distress. HENT:     Head:  Normocephalic and atraumatic.  Neck:     Comments: L neck skin tears currently non bleeding with steri strips. Surrounding echymosis.  Cardiovascular:     Rate and Rhythm: Normal rate and regular rhythm.  Pulmonary:     Effort: Pulmonary effort is normal.     Breath sounds: Normal breath sounds.  Abdominal:     General: Bowel sounds are normal.     Tenderness: There is no abdominal tenderness.  Skin:    Comments: 3x3 cm nonpurulent nonhealing ulcer over left lateral foot. Tender with dressing and especially tender with compression wrapping.   Neurological:     Mental Status: He is alert.    Note image is flipped over horizontal plane. This is lateral left foot ulcer although it does not appear.   Labs & Imaging:   Pertinent Labs: CBC  WBC 17.5->17.5->17.0 Hgb 9.6->10.2->9.0->9.9  CMP  K 3.2->3.2->4.4 BUN 31->29->29 Cr 1.2->1.15->1.06 (at baseline)  Tbili 1.8->1.3->1.2  Imaging: ABI resting L+R noncompressible lower extremitie arteries. Cannot obtain TBI   Assessment/Plan:   Principal Problem:   Syncope Active Problems:   Chronic venous insufficiency   A-fib (HCC)   CAD (coronary artery disease)   Type 2 diabetes mellitus with peripheral angiopathy (HCC)   Chronic HFrEF (heart failure with reduced ejection fraction) (HCC)   Fall   HFrEF (heart failure with  reduced ejection fraction) [facility])   Patient Summary: [name] [name]. [name] [name] is a 89 y.o. M with PMHx atrial fibrillation on eliquis, MI s/p CABG, ischemic cardiomyopathy, HFrEF (LV EF 30%), hypotension on midodrine, hypothyroidism, cancer of head/neck who presents after experiencing a fall.   #Syncope v mechanical fall #Chronic hypotension On eliquis but no acute head blead. Syncope secondary orthostatic hypotension v mechanical fall. Could be related to hypoglycemia, which he has Hx of. Cardiogenic ruled down with only chronic Afib w/o RVR on telemetry and echo with improvement from last year. PT/OT recommend HH,  TOC coordinated. Adherent to midodrine per daughter. Blood pressures responding to midodrine increase.  Plan: -encourage PO intake -plan to increase midodrine from 2.5 -> 5 twice daily.  -encourage family to get glucometer to rule out hypoglycemia when symptomatic in future and discussed giving juice if concerned   #Worsening erythema and warmth on left lateral foot #Chronic venous insufficiency #Subacute venous stasis ulcer of L lateral malleolus Worsening erythema and warmth on the LLE, concerning for celluitis. Nonpurulent. No bone to probe with ulcer. Xray to rule down osteomyelitis and no fevers. S/p angioplasty and stent placement to L superficial femoral and popliteal arteries in 2022. Negative LLE DVT PTA. Stable leukocytosis and afebrile.  Plan: -continue wound care inpatient  -outpatient wound care f/u on Tuesday -start bactrim DS 800-160 once every 12 hours for 7d  #Atrial fibrillation on eliquis Rate controlled with irregular rhythm on exam. CHADVASC 5. Only meets 1/3 criteria for low dose Eliquis (age >98 but is borderline for weight). Evidence is mixed on off label low dose eliquis for pt with Afib and high fall risk but not meeting 2/3 criteria for low dose. Given he is borderline for weight to meet criteria for low dose and his greater fall risk w/ past falls + hypotension, we are opting for eliquis 2.5 bid versus 5 mg or no anticoagulation (irregularly irregular reate.  Plan: -Telemetry monitoring -holding eliquis  2.5 mg and defer to PCP appointment on Wednesday for restarting eliquis -Hold metoprolol 6.25 mg daily given mild hypotension, plan to restart in morning   #Acute blood loss, stable #Acute left neck skin tear, stable #Chronic macrocytic anemia #Chronic vitamin B12 deficiency #Chronic Thrombocytopenia Hgb stable 9-10 (baseline 11-12).  Plan: -Trend CBC -Continue home vitamin B12 1000 mcg daily -Continue home ferrous sulfate 325 mg daily -continue holding  eliquis with stable bleeding -steri strips for continued minor bleeding from skin tear   #Prior MI s/p CABG 1994 #Ischemic cardiomyopathy #HFrEF LV EF 35-40% #Hyperlipidemia LVEF improved from 30% last year to 35-40%.  Plan: -Hold home torsemide 20 mg BID until tomorrow morning -Continue home simvastatin 40 mg daily   #Hypothyroidism Plan: -Continue home levothyroxine 112 mcg daily   #GERD Plan: -Continue home omeprazole 20 mg daily   Diet: Heart Healthy IVF: None VTE: restarted eliquis Code: Comfort Care PT/OT recs: Home Health, walker. TOC recs: none Family Update: daughter at bedside 9/27   Dispo: Anticipated discharge to Home tomorrow pending PT followup per daughter request and tolerating midodrine.    Meryl Dare, MD PGY-1 Psych Resident Contact: secure message or page [phone] Please contact the on call pager after 5 pm and on weekends at [phone].

## 2023-09-27 ENCOUNTER — Inpatient Hospital Stay (HOSPITAL_COMMUNITY): Payer: Medicare HMO

## 2023-09-27 DIAGNOSIS — L89899 Pressure ulcer of other site, unspecified stage: Secondary | ICD-10-CM

## 2023-09-27 LAB — CBC
HCT: 25.1 % — ABNORMAL LOW (ref 39.0–52.0)
Hemoglobin: 8.4 g/dL — ABNORMAL LOW (ref 13.0–17.0)
MCH: 35.4 pg — ABNORMAL HIGH (ref 26.0–34.0)
MCHC: 33.5 g/dL (ref 30.0–36.0)
MCV: 105.9 fL — ABNORMAL HIGH (ref 80.0–100.0)
Platelets: 133 10*3/uL — ABNORMAL LOW (ref 150–400)
RBC: 2.37 MIL/uL — ABNORMAL LOW (ref 4.22–5.81)
RDW: 20.4 % — ABNORMAL HIGH (ref 11.5–15.5)
WBC: 14.8 10*3/uL — ABNORMAL HIGH (ref 4.0–10.5)
nRBC: 0 % (ref 0.0–0.2)

## 2023-09-27 MED ORDER — ACETAMINOPHEN 500 MG PO TABS
1000.0000 mg | ORAL_TABLET | Freq: Four times a day (QID) | ORAL | Status: DC
  Administered 2023-09-27 – 2023-09-30 (×9): 1000 mg via ORAL
  Filled 2023-09-27 (×10): qty 2

## 2023-09-27 MED ORDER — GADOBUTROL 1 MMOL/ML IV SOLN
6.0000 mL | Freq: Once | INTRAVENOUS | Status: AC | PRN
  Administered 2023-09-27: 6 mL via INTRAVENOUS

## 2023-09-27 MED ORDER — IBUPROFEN 600 MG PO TABS
600.0000 mg | ORAL_TABLET | Freq: Once | ORAL | Status: AC
  Administered 2023-09-27: 600 mg via ORAL
  Filled 2023-09-27: qty 1

## 2023-09-27 MED ORDER — IBUPROFEN 800 MG PO TABS
800.0000 mg | ORAL_TABLET | Freq: Two times a day (BID) | ORAL | Status: DC
  Administered 2023-09-27: 800 mg via ORAL
  Filled 2023-09-27 (×2): qty 1

## 2023-09-27 MED ORDER — OXYCODONE HCL 5 MG PO TABS
5.0000 mg | ORAL_TABLET | Freq: Once | ORAL | Status: AC
  Administered 2023-09-27: 5 mg via ORAL
  Filled 2023-09-27: qty 1

## 2023-09-27 MED ORDER — DAPTOMYCIN-SODIUM CHLORIDE 500-0.9 MG/50ML-% IV SOLN
8.0000 mg/kg | Freq: Every day | INTRAVENOUS | Status: DC
  Administered 2023-09-27: 500 mg via INTRAVENOUS
  Filled 2023-09-27 (×2): qty 50

## 2023-09-27 NOTE — TOC Progression Note (Signed)
Transition of Care [facility]) - Progression Note    Patient Details  Name: [name] [name] MRN: [id number] Date of Birth: 06/27/1929  Transition of [facility]) CM/SW Contact  Wendle Kina, [place], [place] Phone Number: 09/27/2023, 12:49 PM  Clinical Narrative:    Confirmed plan for SNF with patient's daughter. Placement process discussed. Patient and family reside in [place] and would like placement in that area. FL2 completed and faxed out to facilities in the [place] area. Bed offers to be provided to patient and family once received.  Rhilyn Battle, LCSW Transition of Care     Expected Discharge Plan: Home w Home Health Services Barriers to Discharge: Continued Medical Work up  Expected Discharge Plan and Services   Discharge Planning Services: CM Consult Post Acute Care Choice: Home Health Living arrangements for the past 2 months: Single Family Home                 DME Arranged: N/A         HH Arranged: RN, PT, OT HH Agency: [facility] Date [facility] Agency Contacted: 09/25/23 Time HH Agency Contacted: 1209 Representative spoke with at [facility] Agency: Kandee Keen   Social Determinants of Health (SDOH) Interventions SDOH Screenings   Food Insecurity: No Food Insecurity (09/24/2023)  Housing: Patient Declined (09/24/2023)  Transportation Needs: No Transportation Needs (09/24/2023)  Utilities: Not At Risk (09/24/2023)  Financial Resource Strain: Low Risk  (09/22/2023)   Received from [facility]  Social Connections: Moderately Isolated (04/24/2022)   Received from [facility]  Tobacco Use: Low Risk  (09/24/2023)    Readmission Risk Interventions     No data to display

## 2023-09-27 NOTE — Plan of Care (Signed)

## 2023-09-27 NOTE — Progress Notes (Signed)
Pharmacy Antibiotic Note  [name] [name] is a 89 y.o. male admitted on 09/24/2023 with cellulitis pending left foot MRI to rule out osteomyelitis. MRI scheduled for 9/29 1330. No cultures were collected and warmth has improved, WBC trending down, and afebrile. Given some response to Bactrim, will time dose for later this evening to read MRI and base dosing off of results. Would consult ID if continuing therapy for osteomyelitis. Pharmacy has been consulted for daptomycin dosing.  Plan: Stop Bactrim Start Daptomycin 500 mg (8 mg/kg) IV q24 hours CTM renal function, CK, cultures, and clinical improvement F/u MRI to rule out osteomyelitis   Height: 5\' 8"  (172.7 cm) Weight: 61.7 kg (136 lb) IBW/kg (Calculated) : 68.4  Temp (24hrs), Avg:99.1 F (37.3 C), Min:98.1 F (36.7 C), Max:100.4 F (38 C)  Recent Labs  Lab 09/24/23 1006 09/24/23 1019 09/25/23 1236 09/26/23 0252 09/26/23 0824 09/27/23 0302  WBC 17.5*  --  17.5* 17.0* 16.0* 14.8*  CREATININE 1.21 1.10 1.15 1.06  --   --   LATICACIDVEN  --  1.7  --   --   --   --     Estimated Creatinine Clearance: 39.6 mL/min (by C-G formula based on SCr of 1.06 mg/dL).    Allergies  Allergen Reactions   Cephalexin Rash   Spironolactone Nausea Only   Codeine Rash and Other (See Comments)    Can not sleep   Doxycycline Rash    Antimicrobials this admission: Bactrim 9/28 >> 9/29 Daptomycin 9/29 >>   Dose adjustments this admission: None.  Microbiology results: None.  Thank you for allowing pharmacy to be a part of this patient's care.  Abelino Derrick, PharmD, MS, BCPS 09/27/2023 1:07 PM

## 2023-09-27 NOTE — Progress Notes (Signed)
HD#3 Subjective:   Summary: [name] [name]. Leffler is a 89 y.o. M with PMHx atrial fibrillation on eliquis, MI s/p CABG, ischemic cardiomyopathy, HFrEF (LV EF 30%), hypotension on midodrine, hypothyroidism, cancer of head/neck who presents after experiencing a fall.   No acute events overnight.  Patient sitting up in chair.  Alert and oriented this morning.  States he is feeling better compared to yesterday.  Still has some weakness, worked with mobility and PT yesterday.  Still has left foot pain.  Objective:   Vitals:   09/26/23 0721 09/26/23 1543 09/26/23 2010 09/27/23 0557  BP: (!) 122/56 (!) 147/75 (!) 121/53 94/60  Pulse: 99 95 91 85  Resp: 16 16    Temp: 99.3 F (37.4 C) (!) 100.4 F (38 C) 99.8 F (37.7 C) 98.1 F (36.7 C)  TempSrc: Oral Oral Oral Oral  SpO2: 97% 95% 98% 95%  Weight:      Height:        Filed Weights   09/24/23 1006  Weight: 61.7 kg   No intake or output data in the 24 hours ending 09/27/23 0654  Net IO Since Admission: 54.41 mL [09/27/23 0654]   Physical Exam:  Physical Exam Constitutional:      General: He is not in acute distress. HENT:     Head: Normocephalic and atraumatic.  Neck:     Comments: L neck skin tears currently non bleeding with steri strips. Surrounding echymosis.  Cardiovascular:     Rate and Rhythm: Normal rate and regular rhythm.  Pulmonary:     Effort: Pulmonary effort is normal.     Breath sounds: Normal breath sounds.  Skin:    Comments: 3x3 cm nonpurulent nonhealing ulcer over left lateral foot.  Noted surrounding erythema and warmth (less hot to the touch compared to yesterday).  Blister with serous fluid, non purulent.   Neurological:     General: No focal deficit present.     Mental Status: He is alert.  Psychiatric:        Mood and Affect: Mood normal.        Behavior: Behavior normal.     Labs & Imaging:  Pertinent Labs: CBC  WBC 17.5->17.5->17.0->14.8 Hgb 9.6->10.2->9.0->8.8->8.4 PLT  98->110->114->133  CMP  K 3.2->3.2->4.4 BUN 31->29->29 Cr 1.2->1.15->1.06 (at baseline)  Tbili 1.8->1.3->1.2  Imaging: ABI: resting L+R noncompressible LE arteries. Cannot obtain TBI Left foot xray: No fracture of dislocation. No overt signs of osteomyelitis.  Left foot MRI: pending  Assessment/Plan:   Principal Problem:   Syncope Active Problems:   Chronic venous insufficiency   A-fib (HCC)   CAD (coronary artery disease)   Type 2 diabetes mellitus with peripheral angiopathy (HCC)   Chronic HFrEF (heart failure with reduced ejection fraction) (HCC)   Fall   HFrEF (heart failure with reduced ejection fraction) (HCC)   Patient Summary: [name] [name]. [name] [name] is a 89 y.o. M with PMHx atrial fibrillation on eliquis, MI s/p CABG, ischemic cardiomyopathy, HFrEF (LV EF 30%), hypotension on midodrine, hypothyroidism, cancer of head/neck who presents after experiencing a fall.   #Syncope v mechanical fall #Chronic hypotension Concern for syncope secondary to orthostatic hypotension versus mechanical fall on anticoagulation.  Midodrine increased to 5 mg 3 times daily with blood pressure stable.  PT reevaluated yesterday and now recommend SNF. -Continue encouraging p.o. intake -Continue PT and OT, currently recommending SNF -Continue midodrine 5 mg 3 times daily   #Left foot cellulitis  #Chronic venous insufficiency #Subacute venous stasis ulcer of L  lateral malleolus Continued erythema of left foot.  Warmth has improved from yesterday.  Concern for cellulitis, started on Bactrim.  X-ray without acute findings but exam today still concerning we will obtain MRI of left foot.  Exam today showed blister with serous fluid drainage. Leukocytosis trending down to 14.8.  Afebrile this morning. -Continue wound care -On bactrim DS BID now, considering switching abx today  -F/u MRI left foot  #Atrial fibrillation on eliquis Rate controlled. CHADVASC 5. Only meets 1/3 criteria for low dose  Eliquis (age >86 but is borderline for weight). Evidence is mixed on off label low dose eliquis for pt with Afib and high fall risk but not meeting 2/3 criteria for low dose. Given he is borderline for weight to meet criteria for low dose and his greater fall risk w/ past falls + hypotension, we are opting for eliquis 2.5 bid versus 5 mg. -Continue telemetry -Holding Eliquis 2.5 mg BID, plan to restart in 1-2 days -Holding metoprolol 6.25 mg daily   #Acute blood loss, resolved #Acute left neck skin tear, improving #Chronic macrocytic anemia #Chronic vitamin B12 deficiency #Chronic Thrombocytopenia Hgb 8.4, slightly down from yesterday. Baseline of 11-12 PTA. -Trend CBC -Continue home vitamin B12 1000 mcg daily -Continue home ferrous sulfate 325 mg daily   #Prior MI s/p CABG 1994 #Ischemic cardiomyopathy #HFrEF LV EF 35-40% #Hyperlipidemia LVEF improved from 30% last year to 35-40%. Appears euvolemic on exam.  -Hold home torsemide 20 mg BID  -Continue home simvastatin 40 mg daily   #Hypothyroidism -Continue home levothyroxine 112 mcg daily   #GERD -Continue home omeprazole 20 mg daily   Diet: Heart Healthy IVF: None VTE: SCD Code:  DNR-Comfort PT/OT recs: SNF for Subacute PT, walker. TOC recs: none Family Update: daughter and son-in-law at bedside 9/29   Dispo: Anticipated discharge to Skilled nursing facility pending MRI of left foot, abx and SNF process.   Rana Snare, DO Internal Medicine Resident PGY-2 Pager: [phone] Please contact the on-call pager after 5 pm and on weekends at [phone].

## 2023-09-27 NOTE — [place] FL2 (Signed)
[place] MEDICAID FL2 LEVEL OF CARE FORM     IDENTIFICATION  Patient Name: [name] [name] Birthdate: [birth year] Sex: male Admission Date (Current Location): 09/24/2023  [facility] [place] and [place] Number:  Producer, television/film/video and Address:  [facility], [address] [place], [place] [address]      Provider Number:    Attending Physician Name and Address:  Ginnie Smart, MD  Relative Name and Phone Number:  Glee Arvin [phone]    Current Level of Care: Hospital Recommended Level of Care: Skilled Nursing Facility Prior Approval Number:    Date Approved/Denied:   PASRR Number: [id number]  Discharge Plan: SNF    Current Diagnoses: Patient Active Problem List   Diagnosis Date Noted   Pressure injury of skin of dorsum of left foot 09/27/2023   Fall 09/26/2023   HFrEF (heart failure with reduced ejection fraction) (HCC) 09/26/2023   Syncope 09/24/2023   Chronic HFrEF (heart failure with reduced ejection fraction) (HCC) 09/04/2021   Atherosclerosis of native arteries of the extremities with ulceration (HCC) 09/04/2021   Atherosclerosis of artery of extremity with rest pain (HCC) 08/30/2021   Atherosclerosis of native arteries of extremity with intermittent claudication (HCC) 02/18/2021   Cataract cortical, senile 02/11/2021   Cor pulmonale, acute (HCC) 05/02/2020   Type 2 diabetes mellitus with peripheral angiopathy (HCC) 11/10/2018   Medicare annual wellness visit, initial 05/05/2017   Varicose veins of lower extremities with ulcer (HCC) 04/23/2017   Chronic venous insufficiency 04/23/2017   Leg pain 04/23/2017   Lymphedema 04/23/2017   A-fib (HCC) 04/23/2017   Hyperlipidemia 04/23/2017   Acquired hypothyroidism 11/03/2016   B12 deficiency 10/27/2014   CAD (coronary artery disease) 04/25/2014   GERD (gastroesophageal reflux disease) 04/25/2014   HTN (hypertension), benign 04/25/2014   S/P CABG x 3 02/26/1993    Orientation  RESPIRATION BLADDER Height & Weight     Self, Time, Situation, Place  Normal External catheter Weight: 136 lb (61.7 kg) Height:  5\' 8"  (172.7 cm)  BEHAVIORAL SYMPTOMS/MOOD NEUROLOGICAL BOWEL NUTRITION STATUS      Continent Diet  AMBULATORY STATUS COMMUNICATION OF NEEDS Skin   Extensive Assist Verbally Normal                       Personal Care Assistance Level of Assistance  Bathing, Feeding, Dressing Bathing Assistance: Maximum assistance Feeding assistance: Limited assistance Dressing Assistance: Maximum assistance     Functional Limitations Info  Sight, Hearing, Speech Sight Info: Adequate Hearing Info: Impaired Speech Info: Adequate    SPECIAL CARE FACTORS FREQUENCY  PT (By licensed PT), OT (By licensed OT)     PT Frequency: 5x per week OT Frequency: 5x per week            Contractures Contractures Info: Not present    Additional Factors Info  Code Status Code Status Info: DNR             Current Medications (09/27/2023):  This is the current hospital active medication list Current Facility-Administered Medications  Medication Dose Route Frequency Provider Last Rate Last Admin   acetaminophen (TYLENOL) tablet 1,000 mg  1,000 mg Oral Q6H PRN Kathleen Lime, MD   1,000 mg at 09/27/23 0427   feeding supplement (ENSURE ENLIVE / ENSURE PLUS) liquid 237 mL  237 mL Oral BID BM Tyson Alias, MD   237 mL at 09/27/23 0812   ferrous sulfate tablet 325 mg  325 mg Oral Once per  day on Monday Thursday Champ Mungo, DO   325 mg at 09/24/23 1351   levothyroxine (SYNTHROID) tablet 112 mcg  112 mcg Oral Daily Champ Mungo, DO   112 mcg at 09/27/23 0542   lip balm (CARMEX) ointment   Topical PRN Champ Mungo, DO   1 Application at 09/26/23 0906   midodrine (PROAMATINE) tablet 5 mg  5 mg Oral TID WC Rana Snare, DO   5 mg at 09/27/23 1137   multivitamin with minerals tablet 1 tablet  1 tablet Oral Daily Champ Mungo, DO   1 tablet at 09/27/23 3086   pantoprazole  (PROTONIX) EC tablet 40 mg  40 mg Oral Daily Champ Mungo, DO   40 mg at 09/27/23 5784   silver nitrate applicators applicator 1 Application  1 Application Topical Once Masters, Katie, DO       simvastatin (ZOCOR) tablet 40 mg  40 mg Oral Daily Champ Mungo, DO   40 mg at 09/27/23 6962   sulfamethoxazole-trimethoprim (BACTRIM DS) 800-160 MG per tablet 1 tablet  1 tablet Oral Q12H Rana Snare, DO   1 tablet at 09/27/23 9528   vitamin B-12 (CYANOCOBALAMIN) tablet 1,000 mcg  1,000 mcg Oral Daily Champ Mungo, DO   1,000 mcg at 09/27/23 4132     Discharge Medications: Please see discharge summary for a list of discharge medications.  Relevant Imaging Results:  Relevant Lab Results:   Additional Information SS# [id number]  Verna Czech [place], [place]

## 2023-09-27 NOTE — Progress Notes (Addendum)
Resident paged as patient with 10/10 pain in left lower extremity.  This is a 89 year-old admitted for syncope most likely due to orthostatic hypotension versus mechanical fall. He was also found to have chronic lower extremity wound now concerning for possible osteomyelitis. MRI is pending. On exam, [name] [name] is alert and oriented x4, grabbing his left leg in pain with grimacing. His toes are well perfused. Dressing is clean and dry. Dressing was unwrapped and wounds appear similar to photos from earlier today. Patient notes slight relief with removal of dressing.   P: 1 time oxycodone 5 mg given. With recent syncope will try to minimize this modality. RN at bedside and will try to wrap dressing a bit looser but I think minimal pressure may still cause him discomfort He has received tylenol 1000 mg x3 today. Will schedule tylenol 1000 mg QID and add ibuprofen 800 mg BID for now.   [name] [name] M. [name], D.O.  Internal Medicine Resident, PGY-3 [facility]  11:13 PM, 09/27/2023   **Please contact the on call pager after 5 pm and on weekends at [phone].**

## 2023-09-27 NOTE — Progress Notes (Addendum)
   09/27/23 1053  Mobility  Activity Ambulated with assistance in room  Level of Assistance Moderate assist, patient does 50-74%  Assistive Device Front wheel walker  Distance Ambulated (ft) 5 ft  Activity Response Tolerated fair  Mobility Referral Yes  Mobility Specialist Start Time (ACUTE ONLY) [phone]  Mobility Specialist Stop Time (ACUTE ONLY) 1015  Mobility Specialist Time Calculation (min) (ACUTE ONLY) 52 min   Mobility Specialist: Progress Note Pre-Mobility: HR 69, BP 98/62 (74) Post- Mobility: HR 88 BP 118/82 (93)  Pt agreeable to mobility session - received in chair. Required ModA throughout using RW. C/o pain in L. Foot. Rated 7/10 which appeared red with yellow drainage. STSx2 (<67mins), took immediate seated break after pt experienced a bout of shakiness. Returned to chair with all needs met - call bell within reach. Chair alarm on. Daughter present. Further mobility deferred d/t drainage.   MS will check in later to evaluate mobility progression.   Barnie Mort, BS Mobility Specialist Please contact via SecureChat or Rehab office at [phone].

## 2023-09-28 DIAGNOSIS — L97329 Non-pressure chronic ulcer of left ankle with unspecified severity: Secondary | ICD-10-CM

## 2023-09-28 DIAGNOSIS — R55 Syncope and collapse: Secondary | ICD-10-CM | POA: Diagnosis not present

## 2023-09-28 DIAGNOSIS — I959 Hypotension, unspecified: Secondary | ICD-10-CM | POA: Diagnosis not present

## 2023-09-28 LAB — CBC
HCT: 21.2 % — ABNORMAL LOW (ref 39.0–52.0)
HCT: 23.6 % — ABNORMAL LOW (ref 39.0–52.0)
Hemoglobin: 7.4 g/dL — ABNORMAL LOW (ref 13.0–17.0)
Hemoglobin: 8 g/dL — ABNORMAL LOW (ref 13.0–17.0)
MCH: 35.7 pg — ABNORMAL HIGH (ref 26.0–34.0)
MCH: 34.6 pg — ABNORMAL HIGH (ref 26.0–34.0)
MCHC: 34.9 g/dL (ref 30.0–36.0)
MCHC: 33.9 g/dL (ref 30.0–36.0)
MCV: 102.4 fL — ABNORMAL HIGH (ref 80.0–100.0)
MCV: 102.2 fL — ABNORMAL HIGH (ref 80.0–100.0)
Platelets: 144 10*3/uL — ABNORMAL LOW (ref 150–400)
Platelets: 167 10*3/uL (ref 150–400)
RBC: 2.07 MIL/uL — ABNORMAL LOW (ref 4.22–5.81)
RBC: 2.31 MIL/uL — ABNORMAL LOW (ref 4.22–5.81)
RDW: 20 % — ABNORMAL HIGH (ref 11.5–15.5)
RDW: 20.1 % — ABNORMAL HIGH (ref 11.5–15.5)
WBC: 10.7 10*3/uL — ABNORMAL HIGH (ref 4.0–10.5)
WBC: 11.2 10*3/uL — ABNORMAL HIGH (ref 4.0–10.5)
nRBC: 0 % (ref 0.0–0.2)
nRBC: 0 % (ref 0.0–0.2)

## 2023-09-28 LAB — DIFFERENTIAL
Abs Immature Granulocytes: 0.12 10*3/uL — ABNORMAL HIGH (ref 0.00–0.07)
Basophils Absolute: 0.1 10*3/uL (ref 0.0–0.1)
Basophils Relative: 0 %
Eosinophils Absolute: 0.3 10*3/uL (ref 0.0–0.5)
Eosinophils Relative: 3 %
Immature Granulocytes: 1 %
Lymphocytes Relative: 10 %
Lymphs Abs: 1.1 10*3/uL (ref 0.7–4.0)
Monocytes Absolute: 1.1 10*3/uL — ABNORMAL HIGH (ref 0.1–1.0)
Monocytes Relative: 10 %
Neutro Abs: 8.5 10*3/uL — ABNORMAL HIGH (ref 1.7–7.7)
Neutrophils Relative %: 76 %

## 2023-09-28 LAB — BASIC METABOLIC PANEL
Anion gap: 6 (ref 5–15)
BUN: 27 mg/dL — ABNORMAL HIGH (ref 8–23)
CO2: 23 mmol/L (ref 22–32)
Calcium: 8 mg/dL — ABNORMAL LOW (ref 8.9–10.3)
Chloride: 102 mmol/L (ref 98–111)
Creatinine, Ser: 1.2 mg/dL (ref 0.61–1.24)
GFR, Estimated: 57 mL/min — ABNORMAL LOW (ref >60)
Glucose, Bld: 90 mg/dL (ref 70–99)
Potassium: 4.2 mmol/L (ref 3.5–5.1)
Sodium: 131 mmol/L — ABNORMAL LOW (ref 135–145)

## 2023-09-28 LAB — LACTATE DEHYDROGENASE: LDH: 153 U/L (ref 98–192)

## 2023-09-28 LAB — HEPATIC FUNCTION PANEL
ALT: 34 U/L (ref 0–44)
AST: 38 U/L (ref 15–41)
Albumin: 2.5 g/dL — ABNORMAL LOW (ref 3.5–5.0)
Alkaline Phosphatase: 74 U/L (ref 38–126)
Bilirubin, Direct: 0.3 mg/dL — ABNORMAL HIGH (ref 0.0–0.2)
Indirect Bilirubin: 0.6 mg/dL (ref 0.3–0.9)
Total Bilirubin: 0.9 mg/dL (ref 0.3–1.2)
Total Protein: 6 g/dL — ABNORMAL LOW (ref 6.5–8.1)

## 2023-09-28 LAB — HEMOGLOBIN AND HEMATOCRIT, BLOOD
HCT: 24.6 % — ABNORMAL LOW (ref 39.0–52.0)
Hemoglobin: 8.4 g/dL — ABNORMAL LOW (ref 13.0–17.0)

## 2023-09-28 LAB — FERRITIN: Ferritin: 733 ng/mL — ABNORMAL HIGH (ref 24–336)

## 2023-09-28 LAB — TECHNOLOGIST SMEAR REVIEW: Plt Morphology: UNDETERMINED

## 2023-09-28 LAB — IRON AND TIBC
Iron: 47 ug/dL (ref 45–182)
Saturation Ratios: 25 % (ref 17.9–39.5)
TIBC: 185 ug/dL — ABNORMAL LOW (ref 250–450)
UIBC: 138 ug/dL

## 2023-09-28 MED ORDER — DAPTOMYCIN-SODIUM CHLORIDE 500-0.9 MG/50ML-% IV SOLN
500.0000 mg | Freq: Every day | INTRAVENOUS | Status: DC
  Administered 2023-09-28: 500 mg via INTRAVENOUS
  Filled 2023-09-28 (×2): qty 50

## 2023-09-28 MED ORDER — SENNOSIDES-DOCUSATE SODIUM 8.6-50 MG PO TABS
1.0000 | ORAL_TABLET | Freq: Every day | ORAL | Status: DC
  Administered 2023-09-28 – 2023-09-29 (×2): 1 via ORAL
  Filled 2023-09-28 (×2): qty 1

## 2023-09-28 MED ORDER — POLYETHYLENE GLYCOL 3350 17 G PO PACK
17.0000 g | PACK | Freq: Every day | ORAL | Status: DC
  Administered 2023-09-28 – 2023-09-30 (×3): 17 g via ORAL
  Filled 2023-09-28 (×3): qty 1

## 2023-09-28 NOTE — Progress Notes (Signed)
Heart Failure Navigator Progress Note  Assessed for Heart & Vascular TOC clinic readiness.  Patient will follow up with [facility] Cardiology practice. .   Navigator available for reassessment of patient.    Rhae Hammock, BSN, Scientist, clinical (histocompatibility and immunogenetics) Only

## 2023-09-28 NOTE — Care Management Important Message (Signed)
Important Message  Patient Details  Name: [name] [name] MRN: [id number] Date of Birth: 1929/08/29   Important Message Given:  Yes - Medicare IM     Sherilyn Banker 09/28/2023, 12:32 PM

## 2023-09-28 NOTE — Plan of Care (Signed)

## 2023-09-28 NOTE — Progress Notes (Signed)
Occupational Therapy Treatment Patient Details Name: [name] [name] MRN: [id number] DOB: [birth year] Today's Date: 09/28/2023   History of present illness Pt is 89 yo presenting to [facility] following fall at home hitting his neck/face after sliding out of bed. Pt also recently had unna boot placed that was too tight; pt removed and now has some swelling in the L foot. PMH: A fib on eliquis, MI s/p CABG, ischemic cardiomyopathy, HFEF, Hypotension, Hypothyroidism, cx of head/neck.   OT comments  Patient received in supine and agreeable to OT/PT session. Patient's daughter arrived during session. Patient requiring assistance of 2 for sit to stands, mobility, and transfers on this date. Patient will benefit from continued inpatient follow up therapy, <3 hours/day due to increased assistance needed with mobility and transfers. Acute OT to continue to follow.       If plan is discharge home, recommend the following:  A little help with walking and/or transfers;A little help with bathing/dressing/bathroom;Assistance with cooking/housework;Assist for transportation;Help with stairs or ramp for entrance   Equipment Recommendations  None recommended by OT    Recommendations for Other Services      Precautions / Restrictions Precautions Precautions: Fall Precaution Comments: soft BP Restrictions Weight Bearing Restrictions: No       Mobility Bed Mobility Overal bed mobility: Needs Assistance Bed Mobility: Supine to Sit     Supine to sit: HOB elevated, Min assist     General bed mobility comments: increased time and min assist    Transfers Overall transfer level: Needs assistance Equipment used: Rolling walker (2 wheels) Transfers: Sit to/from Stand Sit to Stand: Mod assist, +2 physical assistance           General transfer comment: patient required mod assist +2 and feet blocked due to sliding in non-skid socks.     Balance Overall balance assessment: Needs  assistance Sitting-balance support: Single extremity supported Sitting balance-Leahy Scale: Poor Sitting balance - Comments: posterior leaning Postural control: Posterior lean Standing balance support: Bilateral upper extremity supported, Reliant on assistive device for balance Standing balance-Leahy Scale: Poor Standing balance comment: dependent on BUE support and min-modA of 2                           ADL either performed or assessed with clinical judgement   ADL Overall ADL's : Needs assistance/impaired     Grooming: Set up;Sitting Grooming Details (indicate cue type and reason): in recliner         Upper Body Dressing : Minimal assistance;Sitting Upper Body Dressing Details (indicate cue type and reason): donn gown to cover back                        Extremity/Trunk Assessment              Vision       Perception     Praxis      Cognition Arousal: Alert Behavior During Therapy: WFL for tasks assessed/performed Overall Cognitive Status: Impaired/Different from baseline Area of Impairment: Memory, Following commands, Safety/judgement, Problem solving, Orientation                 Orientation Level: Disoriented to, Situation   Memory: Decreased short-term memory Following Commands: Follows one step commands with increased time, Follows one step commands consistently Safety/Judgement: Decreased awareness of deficits   Problem Solving: Slow processing, Decreased initiation, Difficulty sequencing, Requires verbal cues General Comments: able to give correct date  and place, following directions        Exercises      Shoulder Instructions       General Comments VSS on RA    Pertinent Vitals/ Pain       Pain Assessment Pain Assessment: Faces Faces Pain Scale: Hurts even more Pain Location: L foot with touch Pain Descriptors / Indicators: Grimacing Pain Intervention(s): Limited activity within patient's tolerance, Monitored  during session, Repositioned  Home Living                                          Prior Functioning/Environment              Frequency  Min 1X/week        Progress Toward Goals  OT Goals(current goals can now be found in the care plan section)  Progress towards OT goals: Progressing toward goals  Acute Rehab OT Goals Patient Stated Goal: get better OT Goal Formulation: With patient/family Time For Goal Achievement: 10/08/23 Potential to Achieve Goals: Good ADL Goals Pt Will Perform Upper Body Dressing: with modified independence Pt Will Perform Lower Body Dressing: with modified independence Pt Will Transfer to Toilet: with modified independence Pt Will Perform Toileting - Clothing Manipulation and hygiene: with modified independence  Plan      Co-evaluation    PT/OT/SLP Co-Evaluation/Treatment: Yes Reason for Co-Treatment: For patient/therapist safety;To address functional/ADL transfers PT goals addressed during session: Mobility/safety with mobility;Balance;Proper use of DME;Strengthening/ROM OT goals addressed during session: ADL's and self-care      AM-PAC OT "6 Clicks" Daily Activity     Outcome Measure   Help from another person eating meals?: A Little Help from another person taking care of personal grooming?: A Little Help from another person toileting, which includes using toliet, bedpan, or urinal?: A Lot Help from another person bathing (including washing, rinsing, drying)?: A Little Help from another person to put on and taking off regular upper body clothing?: A Little Help from another person to put on and taking off regular lower body clothing?: A Lot 6 Click Score: 16    End of Session Equipment Utilized During Treatment: Gait belt;Rolling walker (2 wheels)  OT Visit Diagnosis: Unsteadiness on feet (R26.81);Other abnormalities of gait and mobility (R26.89);Repeated falls (R29.6);Muscle weakness (generalized)  (M62.81);Pain Pain - Right/Left: Left Pain - part of body: Ankle and joints of foot   Activity Tolerance Patient tolerated treatment well   Patient Left in chair;in CPM;with chair alarm set;with family/visitor present   Nurse Communication Mobility status        Time: 7564-3329 OT Time Calculation (min): 29 min  Charges: OT General Charges $OT Visit: 1 Visit OT Treatments $Self Care/Home Management : 8-22 mins  Alfonse Flavors, OTA Acute Rehabilitation Services  Office [phone]   Dewain Penning 09/28/2023, 1:25 PM

## 2023-09-28 NOTE — Progress Notes (Signed)
Physical Therapy Treatment Patient Details Name: [name] [name] MRN: [id number] DOB: [birth year] Today's Date: 09/28/2023   History of Present Illness Pt is 89 yo presenting to [facility] following fall at home hitting his neck/face after sliding out of bed. Pt also recently had unna boot placed that was too tight; pt removed and now has some swelling in the L foot. PMH: A fib on eliquis, MI s/p CABG, ischemic cardiomyopathy, HFEF, Hypotension, Hypothyroidism, cx of head/neck.    PT Comments  The pt was agreeable to session with focus on continued mobility and gait progression this morning. The pt was better oriented this morning (disoriented to situation only) and better able to follow simple commands and instructions in the session. He required modA to rise to standing with +2 assistance, still needing assist to power up, generate anterior wt shift, and to maintain RW on the ground. The pt did progress to ~8 ft walking with chair follow and assist of 2, will continue to benefit from skilled PT to progress functional strength and stability as well as endurance to facilitate return towards greatest level of independence.   Daughter present and given handout for LE exercises to be completed sitting in recliner and supine in bed.    If plan is discharge home, recommend the following: Assist for transportation;Help with stairs or ramp for entrance;Two people to help with walking and/or transfers;A lot of help with bathing/dressing/bathroom;Assistance with cooking/housework;Direct supervision/assist for medications management;Direct supervision/assist for financial management;Supervision due to cognitive status   Can travel by private vehicle     No  Equipment Recommendations  Rolling walker (2 wheels);Wheelchair (measurements PT);Wheelchair cushion (measurements PT)    Recommendations for Other Services       Precautions / Restrictions Precautions Precautions: Fall Precaution Comments: soft  BP Restrictions Weight Bearing Restrictions: No     Mobility  Bed Mobility Overal bed mobility: Needs Assistance Bed Mobility: Supine to Sit     Supine to sit: HOB elevated, Min assist     General bed mobility comments: minA with cues for LE and UE to complete. increased time    Transfers Overall transfer level: Needs assistance Equipment used: Rolling walker (2 wheels) Transfers: Sit to/from Stand Sit to Stand: Mod assist, +2 physical assistance           General transfer comment: modA of 2 to rise, blocking of feet to prevent forwards sliding. poor power from pt and poor anterior wt shift, needing modA to steady and maintain RW on ground. completed x2 in session    Ambulation/Gait Ambulation/Gait assistance: Min assist, +2 physical assistance, +2 safety/equipment Gait Distance (Feet): 8 Feet Assistive device: Rolling walker (2 wheels) Gait Pattern/deviations: Step-through pattern, Decreased step length - right, Decreased step length - left, Decreased stride length, Decreased dorsiflexion - right, Decreased dorsiflexion - left, Steppage Gait velocity: decreased Gait velocity interpretation: <1.31 ft/sec, indicative of household ambulator   General Gait Details: pt with small shuffling steps and narrow BOS with minimal clearance and dependent on minA of 2 to steady and pull chair up behind him after ~8 ft.     Balance Overall balance assessment: Needs assistance Sitting-balance support: Single extremity supported Sitting balance-Leahy Scale: Poor Sitting balance - Comments: pt falling backwards with attempt to move LE while sitting EOB Postural control: Posterior lean Standing balance support: Bilateral upper extremity supported, Reliant on assistive device for balance Standing balance-Leahy Scale: Poor Standing balance comment: dependent on BUE support and min-modA of 2  Cognition Arousal: Alert Behavior During Therapy: WFL  for tasks assessed/performed Overall Cognitive Status: Impaired/Different from baseline Area of Impairment: Memory, Following commands, Safety/judgement, Problem solving, Orientation                 Orientation Level: Disoriented to, Situation   Memory: Decreased short-term memory Following Commands: Follows one step commands with increased time, Follows one step commands consistently Safety/Judgement: Decreased awareness of deficits   Problem Solving: Slow processing, Decreased initiation, Difficulty sequencing, Requires verbal cues General Comments: pt with improved orientation this morning, perseverating on wrong breakfast order brought. Pt states he called down to correct but when his daughter called to confirm he had not placed the correct order when he called. Pt able to ansewr orientation questions but states he was brought to hospital for LE MRI (not fall). Pt needing increased time to follow commands but generally improved from session on saturday        Exercises General Exercises - Lower Extremity Ankle Circles/Pumps: AROM, Both, 10 reps, Seated Long Arc Quad: AROM, Both, 10 reps, Seated Hip Flexion/Marching: AROM, Both, 10 reps, Seated Heel Raises: AROM, Both, 10 reps, Seated    General Comments General comments (skin integrity, edema, etc.): VSS on RA, daughter presnent      Pertinent Vitals/Pain Pain Assessment Pain Assessment: Faces Faces Pain Scale: Hurts even more Pain Location: L foot with touch Pain Descriptors / Indicators: Grimacing Pain Intervention(s): Limited activity within patient's tolerance, Monitored during session, Repositioned     PT Goals (current goals can now be found in the care plan section) Acute Rehab PT Goals Patient Stated Goal: to return home PT Goal Formulation: With patient/family Time For Goal Achievement: 10/10/23 Potential to Achieve Goals: Fair Progress towards PT goals: Progressing toward goals    Frequency    Min  1X/week       Co-evaluation PT/OT/SLP Co-Evaluation/Treatment: Yes Reason for Co-Treatment: For patient/therapist safety;To address functional/ADL transfers PT goals addressed during session: Mobility/safety with mobility;Balance;Proper use of DME;Strengthening/ROM        AM-PAC PT "6 Clicks" Mobility   Outcome Measure  Help needed turning from your back to your side while in a flat bed without using bedrails?: A Lot Help needed moving from lying on your back to sitting on the side of a flat bed without using bedrails?: A Lot Help needed moving to and from a bed to a chair (including a wheelchair)?: A Lot Help needed standing up from a chair using your arms (e.g., wheelchair or bedside chair)?: A Lot Help needed to walk in hospital room?: A Lot Help needed climbing 3-5 steps with a railing? : Total 6 Click Score: 11    End of Session Equipment Utilized During Treatment: Gait belt Activity Tolerance: Patient tolerated treatment well Patient left: with call bell/phone within reach;in chair;with chair alarm set;with family/visitor present (with vascular for ABIs) Nurse Communication: Mobility status PT Visit Diagnosis: Other abnormalities of gait and mobility (R26.89);Unsteadiness on feet (R26.81);History of falling (Z91.81);Muscle weakness (generalized) (M62.81)     Time: 1610-9604 PT Time Calculation (min) (ACUTE ONLY): 29 min  Charges:    $Gait Training: 8-22 mins PT General Charges $$ ACUTE PT VISIT: 1 Visit                     Vickki Muff, PT, DPT   Acute Rehabilitation Department Office [phone] Secure Chat Communication Preferred   Ronnie Derby 09/28/2023, 10:34 AM

## 2023-09-28 NOTE — TOC Progression Note (Signed)
Transition of Care [facility]) - Progression Note    Patient Details  Name: [name] [name] MRN: [id number] Date of Birth: 01-13-1929  [facility] CM/SW Contact  Carley Hammed, LCSW Phone Number: 09/28/2023, 12:40 PM  Clinical Narrative:    CSW met with pt and [name], [name] [name]. They are agreeable to [facility] [place]. Per MD note still pending testing at this time. Pt will need auth prior to DC. Family to work on Psychologist, forensic for [employer] transport at [employer]. TOC will continue to follow for DC needs.    Expected Discharge Plan: Home w Home Health Services Barriers to Discharge: Continued Medical Work up  Expected Discharge Plan and Services   Discharge Planning Services: CM Consult Post Acute Care Choice: Home Health Living arrangements for the past 2 months: Single Family Home                 DME Arranged: N/A         HH Arranged: RN, PT, OT HH Agency: [facility] Date [facility] Agency Contacted: 09/25/23 Time HH Agency Contacted: 1209 Representative spoke with at [facility] Agency: Kandee Keen   Social Determinants of Health (SDOH) Interventions SDOH Screenings   Food Insecurity: No Food Insecurity (09/24/2023)  Housing: Patient Declined (09/24/2023)  Transportation Needs: No Transportation Needs (09/24/2023)  Utilities: Not At Risk (09/24/2023)  Financial Resource Strain: Low Risk  (09/22/2023)   Received from [facility]  Social Connections: Moderately Isolated (04/24/2022)   Received from [facility]  Tobacco Use: Low Risk  (09/24/2023)    Readmission Risk Interventions     No data to display

## 2023-09-28 NOTE — Progress Notes (Signed)
HD#4 Subjective:   Summary: [name] [name] is a 89 y.o. M with PMHx atrial fibrillation on eliquis, MI s/p CABG, ischemic cardiomyopathy, HFrEF (LV EF 30%), hypotension on midodrine, hypothyroidism, cancer of head/neck who presents after experiencing a fall.   Overnight Events: 10/10 pain in left lower extremity so given 5 mg oxycodone  Interim History: Patient was evaluated at bedside.  Notes improvement in his left foot but there is still considerable swelling and erythema.  Patient denies any shortness of breath, chest pain, fever, chills, or any other new signs or symptoms.  Objective:   Vitals:   09/27/23 0814 09/27/23 2010 09/27/23 2057 09/28/23 0425  BP: (!) 112/57 (!) 95/47 112/70 (!) 98/52  Pulse: 80 70 76 74  Resp: 18     Temp: 98.1 F (36.7 C) 97.9 F (36.6 C) 98.1 F (36.7 C) 97.6 F (36.4 C)  TempSrc: Oral Oral Oral Oral  SpO2: 100% 98% 100% 99%  Weight:      Height:        Filed Weights   09/24/23 1006  Weight: 61.7 kg    Intake/Output Summary (Last 24 hours) at 09/28/2023 0652 Last data filed at 09/28/2023 0549 Gross per 24 hour  Intake 290 ml  Output 1800 ml  Net -1510 ml    Net IO Since Admission: -1,455.59 mL [09/28/23 0652]   CBC    Component Value Date/Time   WBC 11.2 (H) 09/28/2023 0958   RBC 2.31 (L) 09/28/2023 0958   HGB 8.0 (L) 09/28/2023 0958   HGB 11.8 (L) 02/04/2015 1001   HCT 23.6 (L) 09/28/2023 0958   HCT 35.7 (L) 02/04/2015 1001   PLT 167 09/28/2023 0958   PLT 167 02/04/2015 1001   MCV 102.2 (H) 09/28/2023 0958   MCV 108 (H) 02/04/2015 1001   MCH 34.6 (H) 09/28/2023 0958   MCHC 33.9 09/28/2023 0958   RDW 20.1 (H) 09/28/2023 0958   RDW 20.0 (H) 02/04/2015 1001   LYMPHSABS 1.1 09/28/2023 0958   MONOABS 1.1 (H) 09/28/2023 0958   EOSABS 0.3 09/28/2023 0958   BASOSABS 0.1 09/28/2023 0958   CMP     Component Value Date/Time   NA 131 (L) 09/28/2023 0633   NA 141 02/04/2015 1001   K 4.2 09/28/2023 0633   K 4.2  02/04/2015 1001   CL 102 09/28/2023 0633   CL 106 02/04/2015 1001   CO2 23 09/28/2023 0633   CO2 30 02/04/2015 1001   GLUCOSE 90 09/28/2023 0633   GLUCOSE 122 (H) 02/04/2015 1001   BUN 27 (H) 09/28/2023 0633   BUN 19 (H) 02/04/2015 1001   CREATININE 1.20 09/28/2023 0633   CREATININE 1.26 02/04/2015 1001   CALCIUM 8.0 (L) 09/28/2023 0633   CALCIUM 8.5 02/04/2015 1001   PROT 6.0 (L) 09/28/2023 0958   PROT 7.2 02/04/2015 1001   ALBUMIN 2.5 (L) 09/28/2023 0958   ALBUMIN 3.5 02/04/2015 1001   AST 38 09/28/2023 0958   AST 28 02/04/2015 1001   ALT 34 09/28/2023 0958   ALT 20 02/04/2015 1001   ALKPHOS 74 09/28/2023 0958   ALKPHOS 79 02/04/2015 1001   BILITOT 0.9 09/28/2023 0958   BILITOT 0.6 02/04/2015 1001   GFRNONAA 57 (L) 09/28/2023 0633   GFRNONAA 58 (L) 02/04/2015 1001   GFRNONAA >60 11/06/2013 1304    Physical Exam:  Physical Exam Constitutional:      Appearance: Normal appearance.  Cardiovascular:     Rate and Rhythm: Normal rate and regular rhythm.  Pulmonary:     Effort: Pulmonary effort is normal.     Breath sounds: Normal breath sounds.  Abdominal:     General: Abdomen is flat. Bowel sounds are normal.     Palpations: Abdomen is soft.  Feet:     Comments: Significant erythema and swelling still present with ulceration. Warmth improving.  Neurological:     Mental Status: He is alert.      Media Information  Document Information  Photos    09/28/2023 10:30  Attached To:  Hospital Encounter on 09/24/23  Source Information  Morrie Sheldon, MD  [facility]  Document History      Media Information  Document Information  Photos    09/28/2023 10:29  Attached To:  Hospital Encounter on 09/24/23  Source Information  Morrie Sheldon, MD  [facility]  Document History     Labs & Imaging:  Pertinent Labs: CBC    Component Value Date/Time   WBC 14.8 (H) 09/27/2023 0302   RBC 2.37 (L) 09/27/2023 0302   HGB 8.4 (L) 09/27/2023 0302   HGB  11.8 (L) 02/04/2015 1001   HCT 25.1 (L) 09/27/2023 0302   HCT 35.7 (L) 02/04/2015 1001   PLT 133 (L) 09/27/2023 0302   PLT 167 02/04/2015 1001   MCV 105.9 (H) 09/27/2023 0302   MCV 108 (H) 02/04/2015 1001   MCH 35.4 (H) 09/27/2023 0302   MCHC 33.5 09/27/2023 0302   RDW 20.4 (H) 09/27/2023 0302   RDW 20.0 (H) 02/04/2015 1001   LYMPHSABS 1.4 09/26/2023 0824   MONOABS 1.0 09/26/2023 0824   EOSABS 0.1 09/26/2023 0824   BASOSABS 0.0 09/26/2023 0824   CMP     Component Value Date/Time   NA 132 (L) 09/26/2023 0252   NA 141 02/04/2015 1001   K 4.4 09/26/2023 0252   K 4.2 02/04/2015 1001   CL 98 09/26/2023 0252   CL 106 02/04/2015 1001   CO2 22 09/26/2023 0252   CO2 30 02/04/2015 1001   GLUCOSE 115 (H) 09/26/2023 0252   GLUCOSE 122 (H) 02/04/2015 1001   BUN 29 (H) 09/26/2023 0252   BUN 19 (H) 02/04/2015 1001   CREATININE 1.06 09/26/2023 0252   CREATININE 1.26 02/04/2015 1001   CALCIUM 8.6 (L) 09/26/2023 0252   CALCIUM 8.5 02/04/2015 1001   PROT 6.1 (L) 09/26/2023 0252   PROT 7.2 02/04/2015 1001   ALBUMIN 2.7 (L) 09/26/2023 0252   ALBUMIN 3.5 02/04/2015 1001   AST 49 (H) 09/26/2023 0252   AST 28 02/04/2015 1001   ALT 29 09/26/2023 0252   ALT 20 02/04/2015 1001   ALKPHOS 73 09/26/2023 0252   ALKPHOS 79 02/04/2015 1001   BILITOT 1.2 09/26/2023 0252   BILITOT 0.6 02/04/2015 1001   GFRNONAA >60 09/26/2023 0252   GFRNONAA 58 (L) 02/04/2015 1001   GFRNONAA >60 11/06/2013 1304   Imaging: ABI: resting L+R noncompressible LE arteries. Cannot obtain TBI Left foot xray: No fracture of dislocation. No overt signs of osteomyelitis.  Left foot MRI: pending  Assessment/Plan:   Principal Problem:   Syncope Active Problems:   Chronic venous insufficiency   A-fib (HCC)   CAD (coronary artery disease)   Type 2 diabetes mellitus with peripheral angiopathy (HCC)   Chronic HFrEF (heart failure with reduced ejection fraction) (HCC)   Fall   HFrEF (heart failure with reduced ejection  fraction) (HCC)   Pressure injury of skin of dorsum of left foot   Patient Summary: [name] [name] is a 89  y.o. M with PMHx atrial fibrillation on eliquis, MI s/p CABG, ischemic cardiomyopathy, HFrEF (LV EF 30%), hypotension on midodrine, hypothyroidism, cancer of head/neck who presents after experiencing a fall.   #Syncope vs mechanical fall #Chronic hypotension Continues to be a concern for syncope that is secondary to orthostatic hypotension versus mechanical fall on anticoagulation.  Patient's blood pressure remained stable around 100/50 with a MAP of 67.  Patient continues to take midodrine 5 mg 3 times daily.  Patient denies any new symptoms or concerns at this time. - Continue to encourage p.o. intake - Continue PT/OT - Continue midodrine 5 mg 3 times daily  #Left foot cellulitis  #Chronic venous insufficiency #Subacute venous stasis ulcer of L lateral malleolus Patient continues to demonstrate erythema of the left foot with associated warmth with concern for cellulitis.  Patient was switched to daptomycin.  The x-ray did not demonstrate any acute findings, but there is still concern for osteomyelitis.  MRI of the left foot is pending.  White blood cell count decreased to 11.2 from 14.8 yesterday.  Patient remains afebrile. - Continue wound care - Continue daptomycin - Follow-up MRI of left foot  #Atrial fibrillation on eliquis Continues to be rate controlled.  Patient was switched to 2.5 mg due to being borderline for weight to meet criteria for low-dose and his greater fall risk with his history of falls and hypotension.  We will continue to hold his Eliquis and metoprolol due to the patient's decrease in hemoglobin.  Denies any chest pain, palpitations, dizziness, or any other new signs or concerns.  - Continue to hold Eliquis 2.5 mg twice daily - Continue telemetry - Continue to hold metoprolol 6.25 mg daily  #Acute blood loss, resolved #Acute left neck skin tear,  improving #Chronic macrocytic anemia #Chronic vitamin B12 deficiency #Chronic Thrombocytopenia Hemoglobin down to 7.4 from 8.4 yesterday.  Repeat CBC this morning was 8.0.  His baseline prior to admission was 11-12.  Patient denies any dysphagia, hematochezia, dizziness, or any other signs or symptoms concerning for acute bleed.  Ferritin was elevated at 733.  Iron was normal but TIBC was decreased at 185.  LDH within normal limits.  Smear notable for macrocytic anemia with thrombocytopenia. - Follow-up FOBT, CK  - Trend CBC - Continue home vitamin B12 1000 mcg daily - Continue home ferrous sulfate 325 mg daily   #Prior MI s/p CABG 1994 #Ischemic cardiomyopathy #HFrEF LV EF 35-40% #Hyperlipidemia LVEF improved from 30% last year to 35-40%. Appears euvolemic on exam.  -Hold home torsemide 20 mg BID  -Continue home simvastatin 40 mg daily   #Hypothyroidism -Continue home levothyroxine 112 mcg daily   #GERD -Continue home omeprazole 20 mg daily  Diet: Heart Healthy IVF: None VTE: SCD Code:  DNR-Comfort PT/OT recs: SNF for Subacute PT, walker. TOC recs: none   Dispo: Anticipated discharge to Skilled nursing facility pending MRI of left foot, abx and SNF process.   Morrie Sheldon, MD  Internal Medicine Resident PGY-1 Pager: [phone] Please contact the on-call pager after 5 pm and on weekends at [phone].

## 2023-09-28 NOTE — Progress Notes (Addendum)
Patient observed with increased swelling to LLE, skin taut, warm, and painful to touch after sitting in recliner with legs in dependent position. Notified Dr. Rayvon Char, patient evaluated at bedside. New order for [place] to r/o DVT pending at this time. Will endorse to night shift nurse.

## 2023-09-29 ENCOUNTER — Inpatient Hospital Stay (HOSPITAL_COMMUNITY): Payer: Medicare HMO

## 2023-09-29 ENCOUNTER — Ambulatory Visit: Payer: Medicare HMO | Admitting: Physician Assistant

## 2023-09-29 DIAGNOSIS — L97329 Non-pressure chronic ulcer of left ankle with unspecified severity: Secondary | ICD-10-CM | POA: Diagnosis not present

## 2023-09-29 DIAGNOSIS — I959 Hypotension, unspecified: Secondary | ICD-10-CM | POA: Diagnosis not present

## 2023-09-29 DIAGNOSIS — R55 Syncope and collapse: Secondary | ICD-10-CM | POA: Diagnosis not present

## 2023-09-29 LAB — CBC WITH DIFFERENTIAL/PLATELET
Abs Immature Granulocytes: 0.37 10*3/uL — ABNORMAL HIGH (ref 0.00–0.07)
Basophils Absolute: 0.1 10*3/uL (ref 0.0–0.1)
Basophils Relative: 1 %
Eosinophils Absolute: 0.2 10*3/uL (ref 0.0–0.5)
Eosinophils Relative: 2 %
HCT: 26.1 % — ABNORMAL LOW (ref 39.0–52.0)
Hemoglobin: 9.3 g/dL — ABNORMAL LOW (ref 13.0–17.0)
Immature Granulocytes: 4 %
Lymphocytes Relative: 12 %
Lymphs Abs: 1.1 10*3/uL (ref 0.7–4.0)
MCH: 36.9 pg — ABNORMAL HIGH (ref 26.0–34.0)
MCHC: 35.6 g/dL (ref 30.0–36.0)
MCV: 103.6 fL — ABNORMAL HIGH (ref 80.0–100.0)
Monocytes Absolute: 1.2 10*3/uL — ABNORMAL HIGH (ref 0.1–1.0)
Monocytes Relative: 13 %
Neutro Abs: 6.3 10*3/uL (ref 1.7–7.7)
Neutrophils Relative %: 68 %
Platelets: 240 10*3/uL (ref 150–400)
RBC: 2.52 MIL/uL — ABNORMAL LOW (ref 4.22–5.81)
RDW: 19.9 % — ABNORMAL HIGH (ref 11.5–15.5)
WBC: 9.2 10*3/uL (ref 4.0–10.5)
nRBC: 0 % (ref 0.0–0.2)

## 2023-09-29 LAB — RETIC PANEL
Immature Retic Fract: 14.8 % (ref 2.3–15.9)
RBC.: 2.58 MIL/uL — ABNORMAL LOW (ref 4.22–5.81)
Retic Count, Absolute: 12.6 10*3/uL — ABNORMAL LOW (ref 19.0–186.0)
Retic Ct Pct: 0.5 % (ref 0.4–3.1)
Reticulocyte Hemoglobin: 33.9 pg (ref >27.9)

## 2023-09-29 LAB — URINALYSIS, ROUTINE W REFLEX MICROSCOPIC
Bilirubin Urine: NEGATIVE
Glucose, UA: NEGATIVE mg/dL
Hgb urine dipstick: NEGATIVE
Ketones, ur: NEGATIVE mg/dL
Leukocytes,Ua: NEGATIVE
Nitrite: NEGATIVE
Protein, ur: NEGATIVE mg/dL
Specific Gravity, Urine: 1.011 (ref 1.005–1.030)
pH: 5 (ref 5.0–8.0)

## 2023-09-29 LAB — BASIC METABOLIC PANEL
Anion gap: 13 (ref 5–15)
BUN: 24 mg/dL — ABNORMAL HIGH (ref 8–23)
CO2: 21 mmol/L — ABNORMAL LOW (ref 22–32)
Calcium: 8.6 mg/dL — ABNORMAL LOW (ref 8.9–10.3)
Chloride: 100 mmol/L (ref 98–111)
Creatinine, Ser: 1.1 mg/dL (ref 0.61–1.24)
GFR, Estimated: >60 mL/min (ref >60)
Glucose, Bld: 110 mg/dL — ABNORMAL HIGH (ref 70–99)
Potassium: 4.5 mmol/L (ref 3.5–5.1)
Sodium: 134 mmol/L — ABNORMAL LOW (ref 135–145)

## 2023-09-29 LAB — CK: Total CK: 74 U/L (ref 49–397)

## 2023-09-29 MED ORDER — APIXABAN 2.5 MG PO TABS
2.5000 mg | ORAL_TABLET | Freq: Two times a day (BID) | ORAL | Status: DC
  Administered 2023-09-29 – 2023-09-30 (×2): 2.5 mg via ORAL
  Filled 2023-09-29 (×2): qty 1

## 2023-09-29 MED ORDER — TRAZODONE HCL 50 MG PO TABS
50.0000 mg | ORAL_TABLET | Freq: Every day | ORAL | Status: DC
  Administered 2023-09-29: 50 mg via ORAL
  Filled 2023-09-29: qty 1

## 2023-09-29 MED ORDER — SULFAMETHOXAZOLE-TRIMETHOPRIM 800-160 MG PO TABS
1.0000 | ORAL_TABLET | Freq: Two times a day (BID) | ORAL | Status: DC
  Administered 2023-09-29 – 2023-09-30 (×3): 1 via ORAL
  Filled 2023-09-29 (×3): qty 1

## 2023-09-29 NOTE — Progress Notes (Signed)
Pt is refusing to take medication at this time, called his daughter [name] to see if she can convince to take and he wants her to pick him up right now.

## 2023-09-29 NOTE — Progress Notes (Signed)
Pt is disorient to place and time. He states he wants to go home with daughter, this nurse has explain to pt he is not ready for discharge at this time. Pt bed rails are up and bed alarm is set.  Daughter is placing pt dinner tray and will be leaving soon.

## 2023-09-29 NOTE — Plan of Care (Signed)
  Problem: Clinical Measurements: Goal: Diagnostic test results will improve Outcome: Progressing Goal: Cardiovascular complication will be avoided Outcome: Progressing   Problem: Nutrition: Goal: Adequate nutrition will be maintained Outcome: Progressing   Problem: Elimination: Goal: Will not experience complications related to urinary retention Outcome: Progressing   Problem: Pain Managment: Goal: General experience of comfort will improve Outcome: Progressing

## 2023-09-29 NOTE — Progress Notes (Addendum)
   09/29/23 1208  Mobility  Activity Transferred from chair to bed  Level of Assistance Moderate assist, patient does 50-74% (+2)  Assistive Device Stedy  Activity Response Tolerated fair  Mobility Referral Yes  $Mobility charge 1 Mobility  Mobility Specialist Start Time (ACUTE ONLY) 1145  Mobility Specialist Stop Time (ACUTE ONLY) 1200  Mobility Specialist Time Calculation (min) (ACUTE ONLY) 15 min   Mobility Specialist: Progress Note   Pt agreeable to mobility session - received in chair. Required ModA+2 using Stedy, x2 STS. C/o L. foot pain and soreness, shown through facial grimices. Pt oriented to person and time but not place. Returned to bed with all needs met - call bell within reach. Bed alarm on.   L foot heel drainage - RN notified.    [name] [name], BS Mobility Specialist Please contact via SecureChat or Rehab office at [phone].

## 2023-09-29 NOTE — Plan of Care (Signed)

## 2023-09-29 NOTE — Progress Notes (Addendum)
   09/29/23 1024  Mobility  Activity Transferred from bed to chair  Level of Assistance Moderate assist, patient does 50-74% (+2)  Assistive Device Stedy  Activity Response Tolerated fair  Mobility Referral Yes  $Mobility charge 1 Mobility  Mobility Specialist Start Time (ACUTE ONLY) [id number]  Mobility Specialist Stop Time (ACUTE ONLY) 1010  Mobility Specialist Time Calculation (min) (ACUTE ONLY) 24 min   Mobility Specialist: Progress Note  Pt agreeable to mobility session - received in bed. Required ModA+2  using Stedy, x3 STS. C/o L. Foot pain and soreness, shown through facial grimices. Returned to chair with all needs met - call bell within reach. Chair alarm on.   Barnie Mort, BS Mobility Specialist Please contact via SecureChat or Rehab office at [phone].
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
## 2023-09-30 DIAGNOSIS — R635 Abnormal weight gain: Secondary | ICD-10-CM | POA: Diagnosis not present

## 2023-09-30 DIAGNOSIS — I70243 Atherosclerosis of native arteries of left leg with ulceration of ankle: Secondary | ICD-10-CM | POA: Diagnosis not present

## 2023-09-30 DIAGNOSIS — I509 Heart failure, unspecified: Secondary | ICD-10-CM | POA: Diagnosis not present

## 2023-09-30 DIAGNOSIS — N3000 Acute cystitis without hematuria: Secondary | ICD-10-CM | POA: Diagnosis not present

## 2023-09-30 DIAGNOSIS — Z0000 Encounter for general adult medical examination without abnormal findings: Secondary | ICD-10-CM | POA: Diagnosis not present

## 2023-09-30 DIAGNOSIS — I890 Lymphedema, not elsewhere classified: Secondary | ICD-10-CM | POA: Diagnosis not present

## 2023-09-30 DIAGNOSIS — L97523 Non-pressure chronic ulcer of other part of left foot with necrosis of muscle: Secondary | ICD-10-CM | POA: Diagnosis not present

## 2023-09-30 DIAGNOSIS — E1151 Type 2 diabetes mellitus with diabetic peripheral angiopathy without gangrene: Secondary | ICD-10-CM | POA: Diagnosis not present

## 2023-09-30 DIAGNOSIS — I959 Hypotension, unspecified: Secondary | ICD-10-CM | POA: Diagnosis not present

## 2023-09-30 DIAGNOSIS — I872 Venous insufficiency (chronic) (peripheral): Secondary | ICD-10-CM | POA: Diagnosis not present

## 2023-09-30 DIAGNOSIS — I700 Atherosclerosis of aorta: Secondary | ICD-10-CM | POA: Diagnosis not present

## 2023-09-30 DIAGNOSIS — R4182 Altered mental status, unspecified: Secondary | ICD-10-CM | POA: Diagnosis not present

## 2023-09-30 DIAGNOSIS — Z79899 Other long term (current) drug therapy: Secondary | ICD-10-CM | POA: Diagnosis not present

## 2023-09-30 DIAGNOSIS — D649 Anemia, unspecified: Secondary | ICD-10-CM | POA: Diagnosis not present

## 2023-09-30 DIAGNOSIS — I10 Essential (primary) hypertension: Secondary | ICD-10-CM | POA: Diagnosis not present

## 2023-09-30 DIAGNOSIS — R55 Syncope and collapse: Secondary | ICD-10-CM | POA: Diagnosis not present

## 2023-09-30 DIAGNOSIS — W06XXXA Fall from bed, initial encounter: Secondary | ICD-10-CM | POA: Diagnosis not present

## 2023-09-30 DIAGNOSIS — M79605 Pain in left leg: Secondary | ICD-10-CM | POA: Diagnosis not present

## 2023-09-30 DIAGNOSIS — E538 Deficiency of other specified B group vitamins: Secondary | ICD-10-CM | POA: Diagnosis not present

## 2023-09-30 DIAGNOSIS — M79604 Pain in right leg: Secondary | ICD-10-CM | POA: Diagnosis not present

## 2023-09-30 DIAGNOSIS — E782 Mixed hyperlipidemia: Secondary | ICD-10-CM | POA: Diagnosis not present

## 2023-09-30 DIAGNOSIS — I70201 Unspecified atherosclerosis of native arteries of extremities, right leg: Secondary | ICD-10-CM | POA: Diagnosis not present

## 2023-09-30 DIAGNOSIS — M6281 Muscle weakness (generalized): Secondary | ICD-10-CM | POA: Diagnosis not present

## 2023-09-30 DIAGNOSIS — L89620 Pressure ulcer of left heel, unstageable: Secondary | ICD-10-CM | POA: Diagnosis not present

## 2023-09-30 DIAGNOSIS — I2510 Atherosclerotic heart disease of native coronary artery without angina pectoris: Secondary | ICD-10-CM | POA: Diagnosis not present

## 2023-09-30 DIAGNOSIS — N3080 Other cystitis without hematuria: Secondary | ICD-10-CM | POA: Diagnosis not present

## 2023-09-30 DIAGNOSIS — E46 Unspecified protein-calorie malnutrition: Secondary | ICD-10-CM | POA: Diagnosis not present

## 2023-09-30 DIAGNOSIS — R221 Localized swelling, mass and lump, neck: Secondary | ICD-10-CM | POA: Diagnosis not present

## 2023-09-30 DIAGNOSIS — M869 Osteomyelitis, unspecified: Secondary | ICD-10-CM | POA: Diagnosis not present

## 2023-09-30 DIAGNOSIS — I7025 Atherosclerosis of native arteries of other extremities with ulceration: Secondary | ICD-10-CM | POA: Diagnosis not present

## 2023-09-30 DIAGNOSIS — R531 Weakness: Secondary | ICD-10-CM | POA: Diagnosis not present

## 2023-09-30 DIAGNOSIS — L97323 Non-pressure chronic ulcer of left ankle with necrosis of muscle: Secondary | ICD-10-CM | POA: Diagnosis not present

## 2023-09-30 DIAGNOSIS — I110 Hypertensive heart disease with heart failure: Secondary | ICD-10-CM | POA: Diagnosis not present

## 2023-09-30 DIAGNOSIS — Z23 Encounter for immunization: Secondary | ICD-10-CM | POA: Diagnosis not present

## 2023-09-30 DIAGNOSIS — I951 Orthostatic hypotension: Secondary | ICD-10-CM | POA: Diagnosis not present

## 2023-09-30 DIAGNOSIS — D492 Neoplasm of unspecified behavior of bone, soft tissue, and skin: Secondary | ICD-10-CM | POA: Diagnosis not present

## 2023-09-30 DIAGNOSIS — Y92009 Unspecified place in unspecified non-institutional (private) residence as the place of occurrence of the external cause: Secondary | ICD-10-CM | POA: Diagnosis not present

## 2023-09-30 DIAGNOSIS — I70213 Atherosclerosis of native arteries of extremities with intermittent claudication, bilateral legs: Secondary | ICD-10-CM | POA: Diagnosis not present

## 2023-09-30 DIAGNOSIS — I4891 Unspecified atrial fibrillation: Secondary | ICD-10-CM | POA: Diagnosis not present

## 2023-09-30 DIAGNOSIS — L03116 Cellulitis of left lower limb: Secondary | ICD-10-CM | POA: Diagnosis not present

## 2023-09-30 DIAGNOSIS — I70245 Atherosclerosis of native arteries of left leg with ulceration of other part of foot: Secondary | ICD-10-CM | POA: Diagnosis not present

## 2023-09-30 DIAGNOSIS — E11621 Type 2 diabetes mellitus with foot ulcer: Secondary | ICD-10-CM | POA: Diagnosis not present

## 2023-09-30 DIAGNOSIS — I70244 Atherosclerosis of native arteries of left leg with ulceration of heel and midfoot: Secondary | ICD-10-CM | POA: Diagnosis not present

## 2023-09-30 DIAGNOSIS — L97529 Non-pressure chronic ulcer of other part of left foot with unspecified severity: Secondary | ICD-10-CM | POA: Diagnosis not present

## 2023-09-30 DIAGNOSIS — D72829 Elevated white blood cell count, unspecified: Secondary | ICD-10-CM | POA: Diagnosis not present

## 2023-09-30 DIAGNOSIS — I739 Peripheral vascular disease, unspecified: Secondary | ICD-10-CM | POA: Diagnosis not present

## 2023-09-30 DIAGNOSIS — Z7401 Bed confinement status: Secondary | ICD-10-CM | POA: Diagnosis not present

## 2023-09-30 LAB — CBC WITH DIFFERENTIAL/PLATELET
Abs Immature Granulocytes: 0.33 10*3/uL — ABNORMAL HIGH (ref 0.00–0.07)
Basophils Absolute: 0.1 10*3/uL (ref 0.0–0.1)
Basophils Relative: 1 %
Eosinophils Absolute: 0.2 10*3/uL (ref 0.0–0.5)
Eosinophils Relative: 2 %
HCT: 24 % — ABNORMAL LOW (ref 39.0–52.0)
Hemoglobin: 8.2 g/dL — ABNORMAL LOW (ref 13.0–17.0)
Immature Granulocytes: 4 %
Lymphocytes Relative: 12 %
Lymphs Abs: 1 10*3/uL (ref 0.7–4.0)
MCH: 34.6 pg — ABNORMAL HIGH (ref 26.0–34.0)
MCHC: 34.2 g/dL (ref 30.0–36.0)
MCV: 101.3 fL — ABNORMAL HIGH (ref 80.0–100.0)
Monocytes Absolute: 1.1 10*3/uL — ABNORMAL HIGH (ref 0.1–1.0)
Monocytes Relative: 14 %
Neutro Abs: 5.6 10*3/uL (ref 1.7–7.7)
Neutrophils Relative %: 67 %
Platelets: 257 10*3/uL (ref 150–400)
RBC: 2.37 MIL/uL — ABNORMAL LOW (ref 4.22–5.81)
RDW: 20 % — ABNORMAL HIGH (ref 11.5–15.5)
WBC: 8.2 10*3/uL (ref 4.0–10.5)
nRBC: 0 % (ref 0.0–0.2)

## 2023-09-30 LAB — BASIC METABOLIC PANEL
Anion gap: 9 (ref 5–15)
BUN: 17 mg/dL (ref 8–23)
CO2: 23 mmol/L (ref 22–32)
Calcium: 8.6 mg/dL — ABNORMAL LOW (ref 8.9–10.3)
Chloride: 103 mmol/L (ref 98–111)
Creatinine, Ser: 1.15 mg/dL (ref 0.61–1.24)
GFR, Estimated: >60 mL/min (ref >60)
Glucose, Bld: 96 mg/dL (ref 70–99)
Potassium: 3.9 mmol/L (ref 3.5–5.1)
Sodium: 135 mmol/L (ref 135–145)

## 2023-09-30 LAB — HEMOGLOBIN AND HEMATOCRIT, BLOOD
HCT: 24.4 % — ABNORMAL LOW (ref 39.0–52.0)
Hemoglobin: 8.3 g/dL — ABNORMAL LOW (ref 13.0–17.0)

## 2023-09-30 MED ORDER — SULFAMETHOXAZOLE-TRIMETHOPRIM 800-160 MG PO TABS
1.0000 | ORAL_TABLET | Freq: Two times a day (BID) | ORAL | Status: AC
Start: 2023-09-30 — End: 2023-10-03

## 2023-09-30 MED ORDER — APIXABAN 2.5 MG PO TABS: 2.5000 mg | ORAL_TABLET | Freq: Two times a day (BID) | ORAL | Status: DC

## 2023-09-30 MED ORDER — MIDODRINE HCL 5 MG PO TABS: 5.0000 mg | ORAL_TABLET | Freq: Three times a day (TID) | ORAL | Status: DC

## 2023-09-30 NOTE — Progress Notes (Signed)
Occupational Therapy Treatment Patient Details Name: [name] [name] MRN: [id number] DOB: [birth year] Today's Date: 09/30/2023   History of present illness Pt is 89 yo presenting to [facility] following fall at home hitting his neck/face after sliding out of bed. Pt also recently had unna boot placed that was too tight; pt removed and now has some swelling in the L foot. PMH: A fib on eliquis, MI s/p CABG, ischemic cardiomyopathy, HFEF, Hypotension, Hypothyroidism, cx of head/neck.   OT comments  Patient received sitting on EOB, looking for cane and stating he needed to use bathroom. Patient was mod assist of 2 to stand from EOB to allow patient to urinate while standing. Patient ambulated to bathroom with RW and mod assist of 2 with cues for hand placement, walker management, and safety. Patient able to perform grooming tasks seated at sink with cues for sequencing before ambulating to recliner. Patient will benefit from continued inpatient follow up therapy, <3 hours/day to increase independence and safety with bathing, dressing, and functional mobility.       If plan is discharge home, recommend the following:  Assistance with cooking/housework;Assist for transportation;Help with stairs or ramp for entrance;A lot of help with walking and/or transfers;A lot of help with bathing/dressing/bathroom   Equipment Recommendations  None recommended by OT    Recommendations for Other Services      Precautions / Restrictions Precautions Precautions: Fall Precaution Comments: soft BP Restrictions Weight Bearing Restrictions: No       Mobility Bed Mobility Overal bed mobility: Needs Assistance             General bed mobility comments: Pt attempting to get OOB upon PT/OT arrival, pt received sitting EOB.    Transfers Overall transfer level: Needs assistance Equipment used: Rolling walker (2 wheels) Transfers: Sit to/from Stand Sit to Stand: Mod assist, +2 physical assistance            General transfer comment: mod assist +2 due to posterior leaning an dassistance with wheelchair management, cues for hand placement     Balance Overall balance assessment: Needs assistance Sitting-balance support: Feet supported Sitting balance-Leahy Scale: Fair   Postural control: Posterior lean Standing balance support: During functional activity, Bilateral upper extremity supported, Reliant on assistive device for balance Standing balance-Leahy Scale: Poor Standing balance comment: Pt with posterior lean in static standing with poor ability to correct despite multimodal cuing.                           ADL either performed or assessed with clinical judgement   ADL Overall ADL's : Needs assistance/impaired     Grooming: Wash/dry hands;Wash/dry face;Oral care;Brushing hair;Cueing for sequencing;Supervision/safety;Sitting Grooming Details (indicate cue type and reason): seated in chair at sink with cueing for sequencing and staying on task         Upper Body Dressing : Minimal assistance;Sitting Upper Body Dressing Details (indicate cue type and reason): change gown Lower Body Dressing: Maximal assistance;Sitting/lateral leans Lower Body Dressing Details (indicate cue type and reason): to donn sock on LLE Toilet Transfer: Moderate assistance;+2 for physical assistance;Rolling walker (2 wheels) Toilet Transfer Details (indicate cue type and reason): simulated           General ADL Comments: posterior leaning when standing and during transfers    Extremity/Trunk Assessment              Vision       Perception     Praxis  Cognition Arousal: Alert Behavior During Therapy: WFL for tasks assessed/performed Overall Cognitive Status: Impaired/Different from baseline Area of Impairment: Memory, Following commands, Safety/judgement, Problem solving, Attention, Orientation                 Orientation Level: Disoriented to, Situation, Time,  Place   Memory: Decreased short-term memory Following Commands: Follows one step commands with increased time, Follows one step commands consistently Safety/Judgement: Decreased awareness of deficits, Decreased awareness of safety   Problem Solving: Slow processing, Decreased initiation, Difficulty sequencing, Requires verbal cues, Requires tactile cues General Comments: Pt attempting to get out of bed without assistance upon PT/OT arrival        Exercises      Shoulder Instructions       General Comments      Pertinent Vitals/ Pain       Pain Assessment Pain Assessment: Faces Faces Pain Scale: Hurts even more Pain Location: L foot with touch/weight bearing Pain Descriptors / Indicators: Grimacing, Guarding, Discomfort Pain Intervention(s): Monitored during session, Repositioned  Home Living                                          Prior Functioning/Environment              Frequency  Min 1X/week        Progress Toward Goals  OT Goals(current goals can now be found in the care plan section)  Progress towards OT goals: Progressing toward goals  Acute Rehab OT Goals Patient Stated Goal: see his daugther OT Goal Formulation: With patient Time For Goal Achievement: 10/08/23 Potential to Achieve Goals: Good ADL Goals Pt Will Perform Upper Body Dressing: with modified independence Pt Will Perform Lower Body Dressing: with modified independence Pt Will Transfer to Toilet: with modified independence Pt Will Perform Toileting - Clothing Manipulation and hygiene: with modified independence  Plan      Co-evaluation    PT/OT/SLP Co-Evaluation/Treatment: Yes Reason for Co-Treatment: For patient/therapist safety;To address functional/ADL transfers PT goals addressed during session: Mobility/safety with mobility;Balance;Proper use of DME;Strengthening/ROM OT goals addressed during session: ADL's and self-care      AM-PAC OT "6 Clicks" Daily  Activity     Outcome Measure   Help from another person eating meals?: A Little Help from another person taking care of personal grooming?: A Little Help from another person toileting, which includes using toliet, bedpan, or urinal?: A Lot Help from another person bathing (including washing, rinsing, drying)?: A Little Help from another person to put on and taking off regular upper body clothing?: A Little Help from another person to put on and taking off regular lower body clothing?: A Lot 6 Click Score: 16    End of Session Equipment Utilized During Treatment: Gait belt;Rolling walker (2 wheels)  OT Visit Diagnosis: Unsteadiness on feet (R26.81);Other abnormalities of gait and mobility (R26.89);Repeated falls (R29.6);Muscle weakness (generalized) (M62.81);Pain Pain - Right/Left: Left Pain - part of body: Ankle and joints of foot   Activity Tolerance Patient tolerated treatment well   Patient Left in chair;with call bell/phone within reach;with chair alarm set;with nursing/sitter in room   Nurse Communication Mobility status;Other (comment) (informed that patient was seated on EOB upon entry)        Time: 5784-6962 OT Time Calculation (min): 27 min  Charges: OT General Charges $OT Visit: 1 Visit OT Treatments $Self Care/Home Management : 8-22 mins  Alfonse Flavors, OTA Acute Rehabilitation Services  Office [phone]   Dewain Penning 09/30/2023, 11:13 AM

## 2023-09-30 NOTE — TOC Transition Note (Signed)
Transition of Care [facility]) - CM/SW Discharge Note   Patient Details  Name: [name] [name] MRN: [id number] Date of Birth: March 19, 1929  [facility] CM/SW Contact:  Carley Hammed, LCSW Phone Number: 09/30/2023, 2:28 PM   Clinical Narrative:    Pt to be transported to [facility] via [facility]. Nurse to call report to [phone].   Final next level of care: Skilled Nursing Facility Barriers to Discharge: Barriers Resolved   Patient Goals and CMS Choice CMS Medicare.gov Compare Post Acute Care list provided to:: Patient Choice offered to / list presented to : Patient  Discharge Placement                Patient chooses bed at: [facility] Patient to be transferred to facility by: [facility] Name of family member notified: [name] Patient and family notified of of transfer: 09/30/23  Discharge Plan and Services Additional resources added to the After Visit Summary for     Discharge Planning Services: CM Consult Post Acute Care Choice: Home Health          DME Arranged: N/A         HH Arranged: RN, PT, OT HH Agency: [facility] Date [facility] Agency Contacted: 09/25/23 Time HH Agency Contacted: 1209 Representative spoke with at [facility] Agency: Kandee Keen  Social Determinants of Health (SDOH) Interventions SDOH Screenings   Food Insecurity: No Food Insecurity (09/24/2023)  Housing: Patient Declined (09/24/2023)  Transportation Needs: No Transportation Needs (09/24/2023)  Utilities: Not At Risk (09/24/2023)  Financial Resource Strain: Low Risk  (09/22/2023)   Received from [facility]  Social Connections: Moderately Isolated (04/24/2022)   Received from [facility]  Tobacco Use: Low Risk  (09/24/2023)     Readmission Risk Interventions     No data to display

## 2023-09-30 NOTE — Progress Notes (Signed)
Pt refuse to eat dinner.  1551- This nurse called and gave report to [facility] LPN at [facility]. AVS printed and place in the drawer with DNR.   1948- [facility] pickup pt. Daughter [name] called to inform her that her dad has left the hospital. No IV's. Pt foot clean and dress with kerlix and tape.

## 2023-09-30 NOTE — Progress Notes (Signed)
Physical Therapy Treatment Patient Details Name: [name] [name] MRN: [id number] DOB: 11/08/1929 Today's Date: 09/30/2023   History of Present Illness Pt is 89 yo presenting to [facility] following fall at home hitting his neck/face after sliding out of bed. Pt also recently had unna boot placed that was too tight; pt removed and now has some swelling in the L foot. PMH: A fib on eliquis, MI s/p CABG, ischemic cardiomyopathy, HFEF, Hypotension, Hypothyroidism, cx of head/neck.    PT Comments  Pt seen for PT tx with co-tx with OT for pt safety with mobility. Pt received attempting to get OOB, looking for his cane. Pt not oriented to location, situation & therapists attempt to orient pt throughout session. Pt requires mod assist +2 for successful STS from various surfaces, +2 to ambulate bed<>bathroom with RW. Pt with decreased ability to weight bear through LLE during stance phase 2/2 pain, pushes RW out in front of him, and with decreased balance. Pt noting fatigue at end of session. Pt pleasantly confused throughout. Will continue to follow pt acutely to address balance, endurance, & safety with mobility.    If plan is discharge home, recommend the following: Assist for transportation;Help with stairs or ramp for entrance;Two people to help with walking and/or transfers;A lot of help with bathing/dressing/bathroom;Assistance with cooking/housework;Direct supervision/assist for medications management;Direct supervision/assist for financial management;Supervision due to cognitive status   Can travel by private vehicle     No  Equipment Recommendations  Rolling walker (2 wheels);Wheelchair (measurements PT);Wheelchair cushion (measurements PT);BSC/3in1    Recommendations for Other Services       Precautions / Restrictions Precautions Precautions: Fall Precaution Comments: soft BP Restrictions Weight Bearing Restrictions: No     Mobility  Bed Mobility               General bed mobility  comments: Pt attempting to get OOB upon PT/OT arrival, pt received sitting EOB.    Transfers Overall transfer level: Needs assistance Equipment used: Rolling walker (2 wheels) Transfers: Sit to/from Stand Sit to Stand: Mod assist, +2 physical assistance           General transfer comment: STS from elevated EOB, standard chair without armrests with cuing re: scoot out to edge of seat prior to transfer, hand placement, assistance to power up to standing    Ambulation/Gait Ambulation/Gait assistance: Min assist, Mod assist, +2 physical assistance Gait Distance (Feet): 10 Feet (+ 10 ft) Assistive device: Rolling walker (2 wheels) Gait Pattern/deviations: Decreased step length - right, Decreased step length - left, Decreased dorsiflexion - right, Decreased dorsiflexion - left, Decreased stride length, Decreased weight shift to left, Decreased stance time - left Gait velocity: decreased     General Gait Details: Pt pushing RW out in front of him, requires cuing to keep it closer, pt with slowly improving ability to weight shift/weight bear through LLE during stance phase.   Stairs             Wheelchair Mobility     Tilt Bed    Modified Rankin (Stroke Patients Only)       Balance Overall balance assessment: Needs assistance Sitting-balance support: Feet supported Sitting balance-Leahy Scale: Fair   Postural control: Posterior lean Standing balance support: During functional activity, Bilateral upper extremity supported, Reliant on assistive device for balance Standing balance-Leahy Scale: Poor Standing balance comment: Pt with posterior lean in static standing with poor ability to correct despite multimodal cuing.  Cognition Arousal: Alert Behavior During Therapy: WFL for tasks assessed/performed Overall Cognitive Status: Impaired/Different from baseline Area of Impairment: Memory, Following commands, Safety/judgement, Problem  solving, Attention, Orientation                 Orientation Level: Disoriented to, Situation, Time, Place   Memory: Decreased short-term memory Following Commands: Follows one step commands with increased time, Follows one step commands consistently Safety/Judgement: Decreased awareness of deficits, Decreased awareness of safety   Problem Solving: Slow processing, Decreased initiation, Difficulty sequencing, Requires verbal cues, Requires tactile cues General Comments: Pt attempting to get out of bed without assistance upon PT/OT arrival        Exercises      General Comments        Pertinent Vitals/Pain Pain Assessment Pain Assessment: Faces Faces Pain Scale: Hurts even more Pain Location: L foot with touch/weight bearing Pain Descriptors / Indicators: Grimacing, Guarding, Discomfort Pain Intervention(s): Monitored during session (nurse made aware)    Home Living                          Prior Function            PT Goals (current goals can now be found in the care plan section) Acute Rehab PT Goals Patient Stated Goal: to return home PT Goal Formulation: With patient/family Time For Goal Achievement: 10/10/23 Potential to Achieve Goals: Fair Progress towards PT goals: Progressing toward goals    Frequency    Min 1X/week      PT Plan      Co-evaluation PT/OT/SLP Co-Evaluation/Treatment: Yes Reason for Co-Treatment: For patient/therapist safety;To address functional/ADL transfers PT goals addressed during session: Mobility/safety with mobility;Balance;Proper use of DME;Strengthening/ROM        AM-PAC PT "6 Clicks" Mobility   Outcome Measure  Help needed turning from your back to your side while in a flat bed without using bedrails?: A Little Help needed moving from lying on your back to sitting on the side of a flat bed without using bedrails?: A Lot Help needed moving to and from a bed to a chair (including a wheelchair)?: A  Lot Help needed standing up from a chair using your arms (e.g., wheelchair or bedside chair)?: Total Help needed to walk in hospital room?: Total Help needed climbing 3-5 steps with a railing? : Total 6 Click Score: 10    End of Session Equipment Utilized During Treatment: Gait belt Activity Tolerance: Patient tolerated treatment well;Patient limited by fatigue Patient left: in chair;with chair alarm set;with call bell/phone within reach;with nursing/sitter in room Nurse Communication: Mobility status PT Visit Diagnosis: Other abnormalities of gait and mobility (R26.89);Unsteadiness on feet (R26.81);History of falling (Z91.81);Muscle weakness (generalized) (M62.81)     Time: 7829-5621 PT Time Calculation (min) (ACUTE ONLY): 27 min  Charges:    $Therapeutic Activity: 8-22 mins PT General Charges $$ ACUTE PT VISIT: 1 Visit                     Aleda Grana, PT, DPT 09/30/23, 10:16 AM   Sandi Mariscal 09/30/2023, 10:15 AM

## 2023-09-30 NOTE — Plan of Care (Signed)

## 2023-09-30 NOTE — Plan of Care (Signed)
  Problem: Education: Goal: Knowledge of General Education information will improve Description: Including pain rating scale, medication(s)/side effects and non-pharmacologic comfort measures Outcome: Adequate for Discharge   Problem: Health Behavior/Discharge Planning: Goal: Ability to manage health-related needs will improve Outcome: Adequate for Discharge   Problem: Clinical Measurements: Goal: Ability to maintain clinical measurements within normal limits will improve Outcome: Adequate for Discharge Goal: Will remain free from infection Outcome: Adequate for Discharge Goal: Diagnostic test results will improve Outcome: Adequate for Discharge Goal: Respiratory complications will improve Outcome: Adequate for Discharge Goal: Cardiovascular complication will be avoided Outcome: Adequate for Discharge   Problem: Activity: Goal: Risk for activity intolerance will decrease Outcome: Adequate for Discharge   Problem: Nutrition: Goal: Adequate nutrition will be maintained Outcome: Adequate for Discharge   Problem: Coping: Goal: Level of anxiety will decrease Outcome: Adequate for Discharge   Problem: Nutrition: Goal: Adequate nutrition will be maintained Outcome: Adequate for Discharge   Problem: Elimination: Goal: Will not experience complications related to bowel motility Outcome: Adequate for Discharge Goal: Will not experience complications related to urinary retention Outcome: Adequate for Discharge   Problem: Pain Managment: Goal: General experience of comfort will improve Outcome: Adequate for Discharge   Problem: Safety: Goal: Ability to remain free from injury will improve Outcome: Adequate for Discharge   Problem: Skin Integrity: Goal: Risk for impaired skin integrity will decrease Outcome: Adequate for Discharge

## 2023-09-30 NOTE — Progress Notes (Signed)
   09/30/23 1324  Mobility  Activity Transferred from chair to bed  Level of Assistance Moderate assist, patient does 50-74% Nurse, adult)  Assistive Device Front wheel walker  Distance Ambulated (ft) 3 ft  Activity Response Tolerated well  Mobility Referral Yes  $Mobility charge 1 Mobility  Mobility Specialist Start Time (ACUTE ONLY) 1257  Mobility Specialist Stop Time (ACUTE ONLY) 1315  Mobility Specialist Time Calculation (min) (ACUTE ONLY) 18 min   Mobility Specialist: Progress Note  Pt agreeable to mobility session - received in chair. Required ModA+2 STS to MaxA+2 toward EOS with heavy verbal and tactile cuesto use RW to turn. Returned to bed with all needs met - call bell within reach. Bed alarm on. RN and daughter present during session.  Barnie Mort, BS Mobility Specialist Please contact via SecureChat or Rehab office at [phone].

## 2023-10-01 ENCOUNTER — Ambulatory Visit: Payer: Medicare HMO | Admitting: Radiation Oncology

## 2023-10-01 ENCOUNTER — Other Ambulatory Visit (HOSPITAL_COMMUNITY): Payer: Self-pay

## 2023-10-01 DIAGNOSIS — L97323 Non-pressure chronic ulcer of left ankle with necrosis of muscle: Secondary | ICD-10-CM | POA: Diagnosis not present

## 2023-10-01 DIAGNOSIS — L89620 Pressure ulcer of left heel, unstageable: Secondary | ICD-10-CM | POA: Diagnosis not present

## 2023-10-01 DIAGNOSIS — E46 Unspecified protein-calorie malnutrition: Secondary | ICD-10-CM | POA: Diagnosis not present

## 2023-10-01 DIAGNOSIS — M6281 Muscle weakness (generalized): Secondary | ICD-10-CM | POA: Diagnosis not present

## 2023-10-02 DIAGNOSIS — I2510 Atherosclerotic heart disease of native coronary artery without angina pectoris: Secondary | ICD-10-CM | POA: Diagnosis not present

## 2023-10-02 DIAGNOSIS — L03116 Cellulitis of left lower limb: Secondary | ICD-10-CM | POA: Diagnosis not present

## 2023-10-02 DIAGNOSIS — I959 Hypotension, unspecified: Secondary | ICD-10-CM | POA: Diagnosis not present

## 2023-10-02 DIAGNOSIS — I4891 Unspecified atrial fibrillation: Secondary | ICD-10-CM | POA: Diagnosis not present

## 2023-10-05 DIAGNOSIS — I70244 Atherosclerosis of native arteries of left leg with ulceration of heel and midfoot: Secondary | ICD-10-CM | POA: Diagnosis not present

## 2023-10-05 DIAGNOSIS — L89620 Pressure ulcer of left heel, unstageable: Secondary | ICD-10-CM | POA: Diagnosis not present

## 2023-10-05 DIAGNOSIS — I739 Peripheral vascular disease, unspecified: Secondary | ICD-10-CM | POA: Diagnosis not present

## 2023-10-08 DIAGNOSIS — I739 Peripheral vascular disease, unspecified: Secondary | ICD-10-CM | POA: Diagnosis not present

## 2023-10-08 DIAGNOSIS — I70245 Atherosclerosis of native arteries of left leg with ulceration of other part of foot: Secondary | ICD-10-CM | POA: Diagnosis not present

## 2023-10-08 DIAGNOSIS — L97523 Non-pressure chronic ulcer of other part of left foot with necrosis of muscle: Secondary | ICD-10-CM | POA: Diagnosis not present

## 2023-10-08 DIAGNOSIS — E46 Unspecified protein-calorie malnutrition: Secondary | ICD-10-CM | POA: Diagnosis not present

## 2023-10-08 DIAGNOSIS — L89620 Pressure ulcer of left heel, unstageable: Secondary | ICD-10-CM | POA: Diagnosis not present

## 2023-10-09 DIAGNOSIS — L03116 Cellulitis of left lower limb: Secondary | ICD-10-CM | POA: Diagnosis not present

## 2023-10-09 DIAGNOSIS — N3000 Acute cystitis without hematuria: Secondary | ICD-10-CM | POA: Diagnosis not present

## 2023-10-12 DIAGNOSIS — L89620 Pressure ulcer of left heel, unstageable: Secondary | ICD-10-CM | POA: Diagnosis not present

## 2023-10-12 DIAGNOSIS — L97323 Non-pressure chronic ulcer of left ankle with necrosis of muscle: Secondary | ICD-10-CM | POA: Diagnosis not present

## 2023-10-12 DIAGNOSIS — L03116 Cellulitis of left lower limb: Secondary | ICD-10-CM | POA: Diagnosis not present

## 2023-10-12 DIAGNOSIS — I739 Peripheral vascular disease, unspecified: Secondary | ICD-10-CM | POA: Diagnosis not present

## 2023-10-12 DIAGNOSIS — I70243 Atherosclerosis of native arteries of left leg with ulceration of ankle: Secondary | ICD-10-CM | POA: Diagnosis not present

## 2023-10-12 DIAGNOSIS — E46 Unspecified protein-calorie malnutrition: Secondary | ICD-10-CM | POA: Diagnosis not present

## 2023-10-12 DIAGNOSIS — N3000 Acute cystitis without hematuria: Secondary | ICD-10-CM | POA: Diagnosis not present

## 2023-10-14 ENCOUNTER — Ambulatory Visit: Payer: Medicare HMO | Attending: Radiation Oncology | Admitting: Radiation Oncology

## 2023-10-14 DIAGNOSIS — Z510 Encounter for antineoplastic radiation therapy: Secondary | ICD-10-CM | POA: Insufficient documentation

## 2023-10-14 DIAGNOSIS — Z923 Personal history of irradiation: Secondary | ICD-10-CM | POA: Insufficient documentation

## 2023-10-14 DIAGNOSIS — C4442 Squamous cell carcinoma of skin of scalp and neck: Secondary | ICD-10-CM | POA: Insufficient documentation

## 2023-10-14 DIAGNOSIS — C770 Secondary and unspecified malignant neoplasm of lymph nodes of head, face and neck: Secondary | ICD-10-CM | POA: Insufficient documentation

## 2023-10-15 DIAGNOSIS — I739 Peripheral vascular disease, unspecified: Secondary | ICD-10-CM | POA: Diagnosis not present

## 2023-10-15 DIAGNOSIS — I70245 Atherosclerosis of native arteries of left leg with ulceration of other part of foot: Secondary | ICD-10-CM | POA: Diagnosis not present

## 2023-10-15 DIAGNOSIS — L97523 Non-pressure chronic ulcer of other part of left foot with necrosis of muscle: Secondary | ICD-10-CM | POA: Diagnosis not present

## 2023-10-15 DIAGNOSIS — E46 Unspecified protein-calorie malnutrition: Secondary | ICD-10-CM | POA: Diagnosis not present

## 2023-10-15 DIAGNOSIS — N3000 Acute cystitis without hematuria: Secondary | ICD-10-CM | POA: Diagnosis not present

## 2023-10-15 DIAGNOSIS — L89620 Pressure ulcer of left heel, unstageable: Secondary | ICD-10-CM | POA: Diagnosis not present

## 2023-10-19 DIAGNOSIS — R635 Abnormal weight gain: Secondary | ICD-10-CM | POA: Diagnosis not present

## 2023-10-19 DIAGNOSIS — I739 Peripheral vascular disease, unspecified: Secondary | ICD-10-CM | POA: Diagnosis not present

## 2023-10-22 ENCOUNTER — Ambulatory Visit (INDEPENDENT_AMBULATORY_CARE_PROVIDER_SITE_OTHER): Payer: Medicare HMO | Admitting: Nurse Practitioner

## 2023-10-22 ENCOUNTER — Encounter (INDEPENDENT_AMBULATORY_CARE_PROVIDER_SITE_OTHER): Payer: Self-pay | Admitting: Nurse Practitioner

## 2023-10-22 VITALS — BP 90/49 | HR 68 | Resp 18 | Ht 68.0 in | Wt 136.0 lb

## 2023-10-22 DIAGNOSIS — E782 Mixed hyperlipidemia: Secondary | ICD-10-CM

## 2023-10-22 DIAGNOSIS — I7025 Atherosclerosis of native arteries of other extremities with ulceration: Secondary | ICD-10-CM

## 2023-10-22 DIAGNOSIS — L89620 Pressure ulcer of left heel, unstageable: Secondary | ICD-10-CM | POA: Diagnosis not present

## 2023-10-22 DIAGNOSIS — E46 Unspecified protein-calorie malnutrition: Secondary | ICD-10-CM | POA: Diagnosis not present

## 2023-10-22 DIAGNOSIS — I10 Essential (primary) hypertension: Secondary | ICD-10-CM | POA: Diagnosis not present

## 2023-10-22 DIAGNOSIS — I739 Peripheral vascular disease, unspecified: Secondary | ICD-10-CM | POA: Diagnosis not present

## 2023-10-26 DIAGNOSIS — D649 Anemia, unspecified: Secondary | ICD-10-CM | POA: Diagnosis not present

## 2023-10-26 DIAGNOSIS — M6281 Muscle weakness (generalized): Secondary | ICD-10-CM | POA: Diagnosis not present

## 2023-10-26 DIAGNOSIS — D72829 Elevated white blood cell count, unspecified: Secondary | ICD-10-CM | POA: Diagnosis not present

## 2023-10-26 DIAGNOSIS — L03116 Cellulitis of left lower limb: Secondary | ICD-10-CM | POA: Diagnosis not present

## 2023-10-28 DIAGNOSIS — R221 Localized swelling, mass and lump, neck: Secondary | ICD-10-CM | POA: Diagnosis not present

## 2023-10-28 DIAGNOSIS — L03116 Cellulitis of left lower limb: Secondary | ICD-10-CM | POA: Diagnosis not present

## 2023-11-01 ENCOUNTER — Encounter (INDEPENDENT_AMBULATORY_CARE_PROVIDER_SITE_OTHER): Payer: Self-pay

## 2023-11-02 ENCOUNTER — Telehealth (INDEPENDENT_AMBULATORY_CARE_PROVIDER_SITE_OTHER): Payer: Self-pay

## 2023-11-02 NOTE — Progress Notes (Signed)
Subjective:    Patient ID: [name] [name], male    DOB: [birth year], 89 y.o.   MRN: [id number] Chief Complaint  Patient presents with   Follow-up    consult. 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  Today the patient's daughter helps to provide much of the history.  The patient does have wounds which have been slow to heal.  We have also previously seen the patient for lymphedema which seems to be at her abilities to control at the moment.  He has previously had interventions done to the left lower extremity with the most recent being done in 2022.  He had studies done on 09/25/2023 which showed noncompressible ABIs bilaterally but with absent toe waveforms bilaterally.      Review of Systems  Cardiovascular:  Positive for leg swelling.  Skin:  Positive for wound.  Neurological:  Positive for weakness.  All other systems reviewed and are negative.      Objective:   Physical Exam Vitals reviewed.  HENT:     Head: Normocephalic.  Cardiovascular:     Rate and Rhythm: Normal rate.  Skin:    General: Skin is warm and dry.  Neurological:     Mental Status: He is alert and oriented to person, place, and time.     Motor: Weakness present.     Gait: Gait abnormal.  Psychiatric:        Attention and Perception: He is inattentive.        Mood and Affect: Mood normal.        Behavior: Behavior is withdrawn.        Thought Content: Thought content normal.     BP (!) 90/49 (BP Location: Left Arm)   Pulse 68   Resp 18   Ht 5\' 8"  (1.727 m)   Wt 136 lb (61.7 kg)   BMI 20.68 kg/m   Past Medical History:  Diagnosis Date   A-fib (HCC)    a.) CHA2DS2VASc = 5 (age x 2, CHF, HTN, vascular disease history);  b.) rate/rhythm maintained on oral metoprolol succinate chronically anticoagulated with apixaban    Aortic atherosclerosis (HCC)    B12 deficiency    CHF (congestive heart failure) (HCC)    a.) TTE 11/05/2012: EF 25-35%, LV apical thrombus, distal sep AK, mild MR; b.) TTE 05/10/2014: EF 30%, mild LVH, mod BAE, mild AR/PR, mod MR/TR; c.) TTE 03/12/2020: EF 20%, sev glob HK, mod BAE, mild LAE, mod RAE, triv PR, mild AR, sev MR/TR; d.) TTE 08/21/2021: EF 30%, sev glob HK, apical dyskinesis, mild LVH, mod BAE, triv AR/PR, mod MR, sev TR   Cor pulmonale (HCC)    Coronary artery disease    a.) s/p 2v CABG 02/26/1993   Family history of adverse reaction to anesthesia    a.) PONV in 1st degree relative (daughter)   GERD (gastroesophageal reflux disease)    History of [place] spotted fever    HLD (hyperlipidemia)    HTN (hypertension), benign 04/25/2014   Hypothyroidism    Iron deficiency anemia    Long term current use of anticoagulant    a.) apixaban   Posterolateral myocardial infarction [facility])    Pulmonary HTN (HCC)    a.) TTE 05/10/2014: RVSP 48.6; b.) TTE 03/12/2020: RVSP 73.3   PVD (peripheral vascular  disease) (HCC)    S/P CABG x 2 02/26/1993   a.) LIMA-LAD, SVG-OM1   Squamous cell carcinoma of skin 11/18/2021   right ear and mandible, [facility]   Squamous cell carcinoma of skin 03/18/2022   right mandible and ear/refer for Mohs/ Dr. Adriana Simas has referred pt to Dr. Nedra Hai in otolaryngology excised by Dr. Nedra Hai 05/03/22, Radiation with Dr. Rushie Chestnut   Type 2 diabetes mellitus with peripheral angiopathy (HCC) 11/10/2018    Social History   Socioeconomic History   Marital status: Widowed    Spouse name: Not on file   Number of children: 2   Years of education: Not on file   Highest education level: Not on file  Occupational History   Not on file  Tobacco Use   Smoking status: Never   Smokeless tobacco: Never  Vaping Use   Vaping status: Never Used  Substance and Sexual Activity   Alcohol use: No   Drug use: No   Sexual activity: Not on file  Other Topics Concern   Not on file   Social History Narrative   Lives by himself: daughter penny lives in [place]. Daughter [name] lives in town   Social Determinants of Health   Financial Resource Strain: Low Risk  (09/22/2023)   Received from [facility]   Overall Financial Resource Strain (CARDIA)    Difficulty of Paying Living Expenses: Not hard at all  Food Insecurity: No Food Insecurity (09/24/2023)   Hunger Vital Sign    Worried About Running Out of Food in the Last Year: Never true    Ran Out of Food in the Last Year: Never true  Transportation Needs: No Transportation Needs (09/24/2023)   PRAPARE - Administrator, Civil Service (Medical): No    Lack of Transportation (Non-Medical): No  Physical Activity: Not on file  Stress: Not on file  Social Connections: Moderately Isolated (04/24/2022)   Received [facility]   Social Connection and Isolation Panel [NHANES]    Frequency of Communication with Friends and Family: More than three times a week    Frequency of Social Gatherings with Friends and Family: Twice a week    Attends Religious Services: More than 4 times per year    Active Member of [employer] or Organizations: No    Attends Banker Meetings: Never    Marital Status: Widowed  Intimate Partner Violence: Not At Risk (09/24/2023)   Humiliation, Afraid, Rape, and Kick questionnaire    Fear of Current or Ex-Partner: No    Emotionally Abused: No    Physically Abused: No    Sexually Abused: No    Past Surgical History:  Procedure Laterality Date   cancer removal   2023   below the right base of ear at [facility]   COLONOSCOPY     CORONARY ARTERY BYPASS GRAFT N/A 02/26/1993   Procedure: CORONARY ARTERY BYPASS GRAFT; Location: [facility]; Surgeon: Marshell Garfinkel, MD   HERNIA REPAIR     LOWER EXTREMITY ANGIOGRAPHY Left 08/30/2021   Procedure: LOWER EXTREMITY ANGIOGRAPHY;  Surgeon: Renford Dills, MD;  Location: [facility] INVASIVE CV  LAB;  Service: Cardiovascular;  Laterality: Left;    Family History  Problem Relation Age of Onset   Hypertension Mother    Diabetes Brother    Stroke Brother    Heart attack Brother     Allergies  Allergen Reactions   Cephalexin Rash   Spironolactone Nausea Only   Codeine  Rash and Other (See Comments)    Can not sleep   Doxycycline Rash       Latest Ref Rng & Units 09/30/2023   11:14 AM 09/30/2023    6:00 AM 09/29/2023   11:27 AM  CBC  WBC 4.0 - 10.5 K/uL  8.2  9.2   Hemoglobin 13.0 - 17.0 g/dL 8.3  8.2  9.3   Hematocrit 39.0 - 52.0 % 24.4  24.0  26.1   Platelets 150 - 400 K/uL  257  240       CMP     Component Value Date/Time   NA 135 09/30/2023 0600   NA 141 02/04/2015 1001   K 3.9 09/30/2023 0600   K 4.2 02/04/2015 1001   CL 103 09/30/2023 0600   CL 106 02/04/2015 1001   CO2 23 09/30/2023 0600   CO2 30 02/04/2015 1001   GLUCOSE 96 09/30/2023 0600   GLUCOSE 122 (H) 02/04/2015 1001   BUN 17 09/30/2023 0600   BUN 19 (H) 02/04/2015 1001   CREATININE 1.15 09/30/2023 0600   CREATININE 1.26 02/04/2015 1001   CALCIUM 8.6 (L) 09/30/2023 0600   CALCIUM 8.5 02/04/2015 1001   PROT 6.0 (L) 09/28/2023 0958   PROT 7.2 02/04/2015 1001   ALBUMIN 2.5 (L) 09/28/2023 0958   ALBUMIN 3.5 02/04/2015 1001   AST 38 09/28/2023 0958   AST 28 02/04/2015 1001   ALT 34 09/28/2023 0958   ALT 20 02/04/2015 1001   ALKPHOS 74 09/28/2023 0958   ALKPHOS 79 02/04/2015 1001   BILITOT 0.9 09/28/2023 0958   BILITOT 0.6 02/04/2015 1001   GFRNONAA >60 09/30/2023 0600   GFRNONAA 58 (L) 02/04/2015 1001   GFRNONAA >60 11/06/2013 1304     VAS [place] ABI WITH/WO TBI  Result Date: 09/26/2023  LOWER EXTREMITY DOPPLER STUDY Patient Name:  TRYSTAN AKHTAR  Date of Exam:   09/25/2023 Medical Rec #: [id number]         Accession #:    [id number] Date of Birth: 1929-04-22        Patient Gender: M Patient Age:   53 years Exam Location:  [facility] Procedure:      VAS [place] ABI WITH/WO TBI  Referring Phys: DAN FLOYD --------------------------------------------------------------------------------  Indications: Ulceration. High Risk         Hypertension, hyperlipidemia, Diabetes, coronary artery Factors:          disease.  Comparison Study: 12/26/2021 - Right: Resting right ankle-brachial index                   indicates noncompressible right                   lower extremity arteries. The right toe-brachial index is                   normal.                    Left: Resting left ankle-brachial index indicates                   noncompressible left                   lower extremity arteries. The left toe-brachial index is                   normal. Performing Technologist: Olen Cordial RVT  Examination Guidelines: A complete evaluation includes at minimum, Doppler waveform signals and  systolic blood pressure reading at the level of bilateral brachial, anterior tibial, and posterior tibial arteries, when vessel segments are accessible. Bilateral testing is considered an integral part of a complete examination. Photoelectric Plethysmograph (PPG) waveforms and toe systolic pressure readings are included as required and additional duplex testing as needed. Limited examinations for reoccurring indications may be performed as noted.  ABI Findings: +---------+------------------+-----+----------+--------+ Right    Rt Pressure (mmHg)IndexWaveform  Comment  +---------+------------------+-----+----------+--------+ Brachial 117                    triphasic          +---------+------------------+-----+----------+--------+ PTA      245               1.93 monophasic         +---------+------------------+-----+----------+--------+ DP       254               2.00 monophasic         +---------+------------------+-----+----------+--------+ Great Toe                       Absent             +---------+------------------+-----+----------+--------+  +---------+------------------+-----+----------+-------+ Left     Lt Pressure (mmHg)IndexWaveform  Comment +---------+------------------+-----+----------+-------+ Brachial 127                    triphasic         +---------+------------------+-----+----------+-------+ PTA      254               2.00 monophasic        +---------+------------------+-----+----------+-------+ DP       254               2.00 monophasic        +---------+------------------+-----+----------+-------+ Great Toe                       Absent            +---------+------------------+-----+----------+-------+ +-------+-----------+-----------+------------+------------+ ABI/TBIToday's ABIToday's TBIPrevious ABIPrevious TBI +-------+-----------+-----------+------------+------------+ Right  [place]         Absent     [place]          0.68         +-------+-----------+-----------+------------+------------+ Left   [place]         Absent     [place]          0.76         +-------+-----------+-----------+------------+------------+  Summary: Right: Resting right ankle-brachial index indicates noncompressible right lower extremity arteries. Unable to obtain TBI due to absent waveforms. Left: Resting left ankle-brachial index indicates noncompressible left lower extremity arteries. Unable to obtain TBI due to absent waveforms. *See table(s) above for measurements and observations.  Electronically signed by Gerarda Fraction on 09/26/2023 at 1:35:27 PM.    Final        Assessment & Plan:   1. 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  However he also had noninvasive studies with ABIs on 09/25/2023 which showed absent toe waveforms bilaterally.  I have had a long discussion with the patient and his daughter in regards to vascular anatomy as well as intervention.  As far as possibly  treating the dorsalis pedis artery is helpful may not be possible if there is a true occlusion  there because the vessel is so small and our tools are generally not small enough to treat such an intervention we may be able to ensure he has collateral circulation.  Given that he does have wounds which are being slow to heal and studies show absent toe waveforms in general I think it would be prudent for him to move forward with angiogram.  He has previously had 1 before.  We discussed the risk, benefits and alternatives and they are agreeable to proceeding with angiogram of the left lower extremity.  2. HTN (hypertension), benign Continue antihypertensive medications as already ordered, these medications have been reviewed and there are no changes at this time.  3. Mixed hyperlipidemia Continue statin as ordered and reviewed, no changes at this time   Current Outpatient Medications on File Prior to Visit  Medication Sig Dispense Refill   apixaban (ELIQUIS) 2.5 MG TABS tablet Take 1 tablet (2.5 mg total) by mouth 2 (two) times daily.     ASPIRIN LOW DOSE 81 MG tablet TAKE 1 TABLET(81 MG) BY MOUTH DAILY. SWALLOW WHOLE 30 tablet 11   Cyanocobalamin (VITAMIN B-12) 1000 MCG SUBL Take 1,000 mcg by mouth daily.     ferrous sulfate 325 (65 FE) MG EC tablet Take 325 mg by mouth 2 (two) times a week. Tuesday and Thursday     levothyroxine (SYNTHROID, LEVOTHROID) 112 MCG tablet Take 112 mcg by mouth daily.     magnesium hydroxide (MILK OF MAGNESIA) 400 MG/5ML suspension Take 30 mLs by mouth daily as needed for mild constipation.     metoprolol succinate (TOPROL-XL) 25 MG 24 hr tablet Take 12.5 mg by mouth daily.     midodrine (PROAMATINE) 5 MG tablet Take 1 tablet (5 mg total) by mouth 3 (three) times daily with meals.     Multiple Vitamins-Minerals (MULTIVITAMIN WITH MINERALS) tablet Take 1 tablet by mouth daily.     Omega-3 Fatty Acids (FISH OIL) 1000 MG CAPS Take 1,000 mg by mouth in the morning and at  bedtime.     omeprazole (PRILOSEC) 20 MG capsule Take 20 mg by mouth daily.     polyethylene glycol (MIRALAX / GLYCOLAX) packet Take 17 g by mouth daily. Mix one tablespoon with 8oz of your favorite juice or water every day until you are having soft formed stools. Then start taking once daily if you didn't have a stool the day before. (Patient taking differently: Take 17 g by mouth daily.) 30 each 0   potassium chloride (KLOR-CON) 10 MEQ tablet Take 20 mEq by mouth daily.     Probiotic Product (PROBIOTIC PO) Take 1 capsule by mouth daily.     simvastatin (ZOCOR) 40 MG tablet Take 40 mg by mouth daily.     sulfamethoxazole-trimethoprim (BACTRIM DS) 800-160 MG tablet Take 1 tablet by mouth 2 (two) times daily.     traMADol (ULTRAM) 50 MG tablet Take 50 mg by mouth every 6 (six) hours as needed.     acetaminophen (TYLENOL) 500 MG tablet Take 1,000 mg by mouth 2 (two) times daily. (Patient not taking: Reported on 10/22/2023)     No current facility-administered medications on file prior to visit.    There are no Patient Instructions on file for this visit. No follow-ups on file.   Georgiana Spinner, NP

## 2023-11-02 NOTE — H&P (View-Only) (Signed)
 Subjective:    Patient ID: [name] [name], male    DOB: [birth year], 89 y.o.   MRN: [id number] Chief Complaint  Patient presents with   Follow-up    consult. 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  Today the patient's daughter helps to provide much of the history.  The patient does have wounds which have been slow to heal.  We have also previously seen the patient for lymphedema which seems to be at her abilities to control at the moment.  He has previously had interventions done to the left lower extremity with the most recent being done in 2022.  He had studies done on 09/25/2023 which showed noncompressible ABIs bilaterally but with absent toe waveforms bilaterally.      Review of Systems  Cardiovascular:  Positive for leg swelling.  Skin:  Positive for wound.  Neurological:  Positive for weakness.  All other systems reviewed and are negative.      Objective:   Physical Exam Vitals reviewed.  HENT:     Head: Normocephalic.  Cardiovascular:     Rate and Rhythm: Normal rate.  Skin:    General: Skin is warm and dry.  Neurological:     Mental Status: He is alert and oriented to person, place, and time.     Motor: Weakness present.     Gait: Gait abnormal.  Psychiatric:        Attention and Perception: He is inattentive.        Mood and Affect: Mood normal.        Behavior: Behavior is withdrawn.        Thought Content: Thought content normal.     BP (!) 90/49 (BP Location: Left Arm)   Pulse 68   Resp 18   Ht 5\' 8"  (1.727 m)   Wt 136 lb (61.7 kg)   BMI 20.68 kg/m   Past Medical History:  Diagnosis Date   A-fib (HCC)    a.) CHA2DS2VASc = 5 (age x 2, CHF, HTN, vascular disease history);  b.) rate/rhythm maintained on oral metoprolol succinate chronically anticoagulated with apixaban    Aortic atherosclerosis (HCC)    B12 deficiency    CHF (congestive heart failure) (HCC)    a.) TTE 11/05/2012: EF 25-35%, LV apical thrombus, distal sep AK, mild MR; b.) TTE 05/10/2014: EF 30%, mild LVH, mod BAE, mild AR/PR, mod MR/TR; c.) TTE 03/12/2020: EF 20%, sev glob HK, mod BAE, mild LAE, mod RAE, triv PR, mild AR, sev MR/TR; d.) TTE 08/21/2021: EF 30%, sev glob HK, apical dyskinesis, mild LVH, mod BAE, triv AR/PR, mod MR, sev TR   Cor pulmonale (HCC)    Coronary artery disease    a.) s/p 2v CABG 02/26/1993   Family history of adverse reaction to anesthesia    a.) PONV in 1st degree relative (daughter)   GERD (gastroesophageal reflux disease)    History of [place] spotted fever    HLD (hyperlipidemia)    HTN (hypertension), benign 04/25/2014   Hypothyroidism    Iron deficiency anemia    Long term current use of anticoagulant    a.) apixaban   Posterolateral myocardial infarction [facility])    Pulmonary HTN (HCC)    a.) TTE 05/10/2014: RVSP 48.6; b.) TTE 03/12/2020: RVSP 73.3   PVD (peripheral vascular  disease) (HCC)    S/P CABG x 2 02/26/1993   a.) LIMA-LAD, SVG-OM1   Squamous cell carcinoma of skin 11/18/2021   right ear and mandible, EDC   Squamous cell carcinoma of skin 03/18/2022   right mandible and ear/refer for Mohs/ Dr. Adriana Simas has referred pt to Dr. Nedra Hai in otolaryngology excised by Dr. Nedra Hai 05/03/22, Radiation with Dr. Rushie Chestnut   Type 2 diabetes mellitus with peripheral angiopathy (HCC) 11/10/2018    Social History   Socioeconomic History   Marital status: Widowed    Spouse name: Not on file   Number of children: 2   Years of education: Not on file   Highest education level: Not on file  Occupational History   Not on file  Tobacco Use   Smoking status: Never   Smokeless tobacco: Never  Vaping Use   Vaping status: Never Used  Substance and Sexual Activity   Alcohol use: No   Drug use: No   Sexual activity: Not on file  Other Topics Concern   Not on file   Social History Narrative   Lives by himself: daughter penny lives in [place]. Daughter [name] lives in town   Social Determinants of Health   Financial Resource Strain: Low Risk  (09/22/2023)   Received from [facility]   Overall Financial Resource Strain (CARDIA)    Difficulty of Paying Living Expenses: Not hard at all  Food Insecurity: No Food Insecurity (09/24/2023)   Hunger Vital Sign    Worried About Running Out of Food in the Last Year: Never true    Ran Out of Food in the Last Year: Never true  Transportation Needs: No Transportation Needs (09/24/2023)   PRAPARE - Administrator, Civil Service (Medical): No    Lack of Transportation (Non-Medical): No  Physical Activity: Not on file  Stress: Not on file  Social Connections: Moderately Isolated (04/24/2022)   [facility]   Social Connection and Isolation Panel [NHANES]    Frequency of Communication with Friends and Family: More than three times a week    Frequency of Social Gatherings with Friends and Family: Twice a week    Attends Religious Services: More than 4 times per year    Active Member of [employer] or Organizations: No    Attends Banker Meetings: Never    Marital Status: Widowed  Intimate Partner Violence: Not At Risk (09/24/2023)   Humiliation, Afraid, Rape, and Kick questionnaire    Fear of Current or Ex-Partner: No    Emotionally Abused: No    Physically Abused: No    Sexually Abused: No    Past Surgical History:  Procedure Laterality Date   cancer removal   2023   below the right base of ear at [facility]   COLONOSCOPY     CORONARY ARTERY BYPASS GRAFT N/A 02/26/1993   Procedure: CORONARY ARTERY BYPASS GRAFT; Location: [facility]; Surgeon: Marshell Garfinkel, MD   HERNIA REPAIR     LOWER EXTREMITY ANGIOGRAPHY Left 08/30/2021   Procedure: LOWER EXTREMITY ANGIOGRAPHY;  Surgeon: Renford Dills, MD;  Location: [facility] INVASIVE CV  LAB;  Service: Cardiovascular;  Laterality: Left;    Family History  Problem Relation Age of Onset   Hypertension Mother    Diabetes Brother    Stroke Brother    Heart attack Brother     Allergies  Allergen Reactions   Cephalexin Rash   Spironolactone Nausea Only   Codeine  Rash and Other (See Comments)    Can not sleep   Doxycycline Rash       Latest Ref Rng & Units 09/30/2023   11:14 AM 09/30/2023    6:00 AM 09/29/2023   11:27 AM  CBC  WBC 4.0 - 10.5 K/uL  8.2  9.2   Hemoglobin 13.0 - 17.0 g/dL 8.3  8.2  9.3   Hematocrit 39.0 - 52.0 % 24.4  24.0  26.1   Platelets 150 - 400 K/uL  257  240       CMP     Component Value Date/Time   NA 135 09/30/2023 0600   NA 141 02/04/2015 1001   K 3.9 09/30/2023 0600   K 4.2 02/04/2015 1001   CL 103 09/30/2023 0600   CL 106 02/04/2015 1001   CO2 23 09/30/2023 0600   CO2 30 02/04/2015 1001   GLUCOSE 96 09/30/2023 0600   GLUCOSE 122 (H) 02/04/2015 1001   BUN 17 09/30/2023 0600   BUN 19 (H) 02/04/2015 1001   CREATININE 1.15 09/30/2023 0600   CREATININE 1.26 02/04/2015 1001   CALCIUM 8.6 (L) 09/30/2023 0600   CALCIUM 8.5 02/04/2015 1001   PROT 6.0 (L) 09/28/2023 0958   PROT 7.2 02/04/2015 1001   ALBUMIN 2.5 (L) 09/28/2023 0958   ALBUMIN 3.5 02/04/2015 1001   AST 38 09/28/2023 0958   AST 28 02/04/2015 1001   ALT 34 09/28/2023 0958   ALT 20 02/04/2015 1001   ALKPHOS 74 09/28/2023 0958   ALKPHOS 79 02/04/2015 1001   BILITOT 0.9 09/28/2023 0958   BILITOT 0.6 02/04/2015 1001   GFRNONAA >60 09/30/2023 0600   GFRNONAA 58 (L) 02/04/2015 1001   GFRNONAA >60 11/06/2013 1304     VAS [place] ABI WITH/WO TBI  Result Date: 09/26/2023  LOWER EXTREMITY DOPPLER STUDY Patient Name:  [name] [name]  Date of Exam:   09/25/2023 Medical Rec #: [id number]         Accession #:    [id number] Date of Birth: 1929-04-22        Patient Gender: M Patient Age:   53 years Exam Location:  [facility] Procedure:      VAS [place] ABI WITH/WO TBI  Referring Phys: DAN FLOYD --------------------------------------------------------------------------------  Indications: Ulceration. High Risk         Hypertension, hyperlipidemia, Diabetes, coronary artery Factors:          disease.  Comparison Study: 12/26/2021 - Right: Resting right ankle-brachial index                   indicates noncompressible right                   lower extremity arteries. The right toe-brachial index is                   normal.                    Left: Resting left ankle-brachial index indicates                   noncompressible left                   lower extremity arteries. The left toe-brachial index is                   normal. Performing Technologist: Olen Cordial RVT  Examination Guidelines: A complete evaluation includes at minimum, Doppler waveform signals and  systolic blood pressure reading at the level of bilateral brachial, anterior tibial, and posterior tibial arteries, when vessel segments are accessible. Bilateral testing is considered an integral part of a complete examination. Photoelectric Plethysmograph (PPG) waveforms and toe systolic pressure readings are included as required and additional duplex testing as needed. Limited examinations for reoccurring indications may be performed as noted.  ABI Findings: +---------+------------------+-----+----------+--------+ Right    Rt Pressure (mmHg)IndexWaveform  Comment  +---------+------------------+-----+----------+--------+ Brachial 117                    triphasic          +---------+------------------+-----+----------+--------+ PTA      245               1.93 monophasic         +---------+------------------+-----+----------+--------+ DP       254               2.00 monophasic         +---------+------------------+-----+----------+--------+ Great Toe                       Absent             +---------+------------------+-----+----------+--------+  +---------+------------------+-----+----------+-------+ Left     Lt Pressure (mmHg)IndexWaveform  Comment +---------+------------------+-----+----------+-------+ Brachial 127                    triphasic         +---------+------------------+-----+----------+-------+ PTA      254               2.00 monophasic        +---------+------------------+-----+----------+-------+ DP       254               2.00 monophasic        +---------+------------------+-----+----------+-------+ Great Toe                       Absent            +---------+------------------+-----+----------+-------+ +-------+-----------+-----------+------------+------------+ ABI/TBIToday's ABIToday's TBIPrevious ABIPrevious TBI +-------+-----------+-----------+------------+------------+ Right  [place]         Absent     [place]          0.68         +-------+-----------+-----------+------------+------------+ Left   [place]         Absent     [place]          0.76         +-------+-----------+-----------+------------+------------+  Summary: Right: Resting right ankle-brachial index indicates noncompressible right lower extremity arteries. Unable to obtain TBI due to absent waveforms. Left: Resting left ankle-brachial index indicates noncompressible left lower extremity arteries. Unable to obtain TBI due to absent waveforms. *See table(s) above for measurements and observations.  Electronically signed by Gerarda Fraction on 09/26/2023 at 1:35:27 PM.    Final        Assessment & Plan:   1. 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  However he also had noninvasive studies with ABIs on 09/25/2023 which showed absent toe waveforms bilaterally.  I have had a long discussion with the patient and his daughter in regards to vascular anatomy as well as intervention.  As far as possibly  treating the dorsalis pedis artery is helpful may not be possible if there is a true occlusion  there because the vessel is so small and our tools are generally not small enough to treat such an intervention we may be able to ensure he has collateral circulation.  Given that he does have wounds which are being slow to heal and studies show absent toe waveforms in general I think it would be prudent for him to move forward with angiogram.  He has previously had 1 before.  We discussed the risk, benefits and alternatives and they are agreeable to proceeding with angiogram of the left lower extremity.  2. HTN (hypertension), benign Continue antihypertensive medications as already ordered, these medications have been reviewed and there are no changes at this time.  3. Mixed hyperlipidemia Continue statin as ordered and reviewed, no changes at this time   Current Outpatient Medications on File Prior to Visit  Medication Sig Dispense Refill   apixaban (ELIQUIS) 2.5 MG TABS tablet Take 1 tablet (2.5 mg total) by mouth 2 (two) times daily.     ASPIRIN LOW DOSE 81 MG tablet TAKE 1 TABLET(81 MG) BY MOUTH DAILY. SWALLOW WHOLE 30 tablet 11   Cyanocobalamin (VITAMIN B-12) 1000 MCG SUBL Take 1,000 mcg by mouth daily.     ferrous sulfate 325 (65 FE) MG EC tablet Take 325 mg by mouth 2 (two) times a week. Tuesday and Thursday     levothyroxine (SYNTHROID, LEVOTHROID) 112 MCG tablet Take 112 mcg by mouth daily.     magnesium hydroxide (MILK OF MAGNESIA) 400 MG/5ML suspension Take 30 mLs by mouth daily as needed for mild constipation.     metoprolol succinate (TOPROL-XL) 25 MG 24 hr tablet Take 12.5 mg by mouth daily.     midodrine (PROAMATINE) 5 MG tablet Take 1 tablet (5 mg total) by mouth 3 (three) times daily with meals.     Multiple Vitamins-Minerals (MULTIVITAMIN WITH MINERALS) tablet Take 1 tablet by mouth daily.     Omega-3 Fatty Acids (FISH OIL) 1000 MG CAPS Take 1,000 mg by mouth in the morning and at  bedtime.     omeprazole (PRILOSEC) 20 MG capsule Take 20 mg by mouth daily.     polyethylene glycol (MIRALAX / GLYCOLAX) packet Take 17 g by mouth daily. Mix one tablespoon with 8oz of your favorite juice or water every day until you are having soft formed stools. Then start taking once daily if you didn't have a stool the day before. (Patient taking differently: Take 17 g by mouth daily.) 30 each 0   potassium chloride (KLOR-CON) 10 MEQ tablet Take 20 mEq by mouth daily.     Probiotic Product (PROBIOTIC PO) Take 1 capsule by mouth daily.     simvastatin (ZOCOR) 40 MG tablet Take 40 mg by mouth daily.     sulfamethoxazole-trimethoprim (BACTRIM DS) 800-160 MG tablet Take 1 tablet by mouth 2 (two) times daily.     traMADol (ULTRAM) 50 MG tablet Take 50 mg by mouth every 6 (six) hours as needed.     acetaminophen (TYLENOL) 500 MG tablet Take 1,000 mg by mouth 2 (two) times daily. (Patient not taking: Reported on 10/22/2023)     No current facility-administered medications on file prior to visit.    There are no Patient Instructions on file for this visit. No follow-ups on file.   Georgiana Spinner, NP

## 2023-11-02 NOTE — Telephone Encounter (Signed)
Spoke with the patient's daughter, she was offered 11/03/23 for the patient's LLE angio with Dr. Gilda Crease. The daughter stated the patient had a daughter that became deceased on 2023/11/29 so she wanted a different day. I then offered 11/10/23 with a 10:00 am arrival time to the [facility]. Pre- procedure instructions were discussed and will be sent to Mychart and mailed. The daughter called back requesting that her father be kept overnight for observation as he bled a lot from his last leg angio. I advised that she voice her concern when she brings him in for his procedure.

## 2023-11-03 ENCOUNTER — Telehealth (INDEPENDENT_AMBULATORY_CARE_PROVIDER_SITE_OTHER): Payer: Self-pay

## 2023-11-03 DIAGNOSIS — L03116 Cellulitis of left lower limb: Secondary | ICD-10-CM | POA: Diagnosis not present

## 2023-11-03 DIAGNOSIS — R221 Localized swelling, mass and lump, neck: Secondary | ICD-10-CM | POA: Diagnosis not present

## 2023-11-03 NOTE — Telephone Encounter (Signed)
[name] called from [employer] stating she needs [name] [name] pre op orders faxed.

## 2023-11-04 NOTE — Telephone Encounter (Signed)
Called [name] this morning I will fax Pre op orders to [phone]

## 2023-11-05 DIAGNOSIS — I4891 Unspecified atrial fibrillation: Secondary | ICD-10-CM | POA: Diagnosis not present

## 2023-11-05 DIAGNOSIS — I739 Peripheral vascular disease, unspecified: Secondary | ICD-10-CM | POA: Diagnosis not present

## 2023-11-05 DIAGNOSIS — L89620 Pressure ulcer of left heel, unstageable: Secondary | ICD-10-CM | POA: Diagnosis not present

## 2023-11-05 DIAGNOSIS — I2510 Atherosclerotic heart disease of native coronary artery without angina pectoris: Secondary | ICD-10-CM | POA: Diagnosis not present

## 2023-11-05 DIAGNOSIS — E46 Unspecified protein-calorie malnutrition: Secondary | ICD-10-CM | POA: Diagnosis not present

## 2023-11-05 DIAGNOSIS — I70243 Atherosclerosis of native arteries of left leg with ulceration of ankle: Secondary | ICD-10-CM | POA: Diagnosis not present

## 2023-11-05 DIAGNOSIS — L97323 Non-pressure chronic ulcer of left ankle with necrosis of muscle: Secondary | ICD-10-CM | POA: Diagnosis not present

## 2023-11-05 DIAGNOSIS — I959 Hypotension, unspecified: Secondary | ICD-10-CM | POA: Diagnosis not present

## 2023-11-05 DIAGNOSIS — L03116 Cellulitis of left lower limb: Secondary | ICD-10-CM | POA: Diagnosis not present

## 2023-11-09 DIAGNOSIS — E46 Unspecified protein-calorie malnutrition: Secondary | ICD-10-CM | POA: Diagnosis not present

## 2023-11-09 DIAGNOSIS — L89620 Pressure ulcer of left heel, unstageable: Secondary | ICD-10-CM | POA: Diagnosis not present

## 2023-11-09 DIAGNOSIS — I739 Peripheral vascular disease, unspecified: Secondary | ICD-10-CM | POA: Diagnosis not present

## 2023-11-09 DIAGNOSIS — R4182 Altered mental status, unspecified: Secondary | ICD-10-CM | POA: Diagnosis not present

## 2023-11-10 ENCOUNTER — Ambulatory Visit
Admission: RE | Admit: 2023-11-10 | Discharge: 2023-11-10 | Disposition: A | Discharge status: Home / Self Care | Payer: Medicare HMO | Attending: Vascular Surgery | Admitting: Vascular Surgery

## 2023-11-10 ENCOUNTER — Encounter: Payer: Self-pay | Admitting: Vascular Surgery

## 2023-11-10 ENCOUNTER — Encounter
Admission: RE | Disposition: A | Discharge status: Home / Self Care | Payer: Self-pay | Source: Home / Self Care | Attending: Vascular Surgery

## 2023-11-10 DIAGNOSIS — I70201 Unspecified atherosclerosis of native arteries of extremities, right leg: Secondary | ICD-10-CM | POA: Diagnosis not present

## 2023-11-10 DIAGNOSIS — L97529 Non-pressure chronic ulcer of other part of left foot with unspecified severity: Secondary | ICD-10-CM | POA: Diagnosis not present

## 2023-11-10 DIAGNOSIS — I110 Hypertensive heart disease with heart failure: Secondary | ICD-10-CM | POA: Diagnosis not present

## 2023-11-10 DIAGNOSIS — I509 Heart failure, unspecified: Secondary | ICD-10-CM | POA: Diagnosis not present

## 2023-11-10 DIAGNOSIS — E782 Mixed hyperlipidemia: Secondary | ICD-10-CM | POA: Diagnosis not present

## 2023-11-10 DIAGNOSIS — I890 Lymphedema, not elsewhere classified: Secondary | ICD-10-CM | POA: Diagnosis not present

## 2023-11-10 DIAGNOSIS — I700 Atherosclerosis of aorta: Secondary | ICD-10-CM | POA: Diagnosis not present

## 2023-11-10 DIAGNOSIS — I70245 Atherosclerosis of native arteries of left leg with ulceration of other part of foot: Secondary | ICD-10-CM | POA: Insufficient documentation

## 2023-11-10 DIAGNOSIS — Z79899 Other long term (current) drug therapy: Secondary | ICD-10-CM | POA: Insufficient documentation

## 2023-11-10 DIAGNOSIS — E11621 Type 2 diabetes mellitus with foot ulcer: Secondary | ICD-10-CM | POA: Diagnosis not present

## 2023-11-10 DIAGNOSIS — E1151 Type 2 diabetes mellitus with diabetic peripheral angiopathy without gangrene: Secondary | ICD-10-CM | POA: Insufficient documentation

## 2023-11-10 DIAGNOSIS — L97909 Non-pressure chronic ulcer of unspecified part of unspecified lower leg with unspecified severity: Secondary | ICD-10-CM

## 2023-11-10 HISTORY — PX: LOWER EXTREMITY ANGIOGRAPHY: CATH118251

## 2023-11-10 LAB — CREATININE, SERUM
Creatinine, Ser: 0.95 mg/dL (ref 0.61–1.24)
GFR, Estimated: >60 mL/min (ref >60)

## 2023-11-10 LAB — BUN: BUN: 24 mg/dL — ABNORMAL HIGH (ref 8–23)

## 2023-11-10 SURGERY — LOWER EXTREMITY ANGIOGRAPHY
Anesthesia: Moderate Sedation | Site: Leg Lower | Laterality: Left

## 2023-11-10 MED ORDER — DIPHENHYDRAMINE HCL 50 MG/ML IJ SOLN: 50.0000 mg | Freq: Once | INTRAMUSCULAR | Status: DC | PRN

## 2023-11-10 MED ORDER — METHYLPREDNISOLONE SODIUM SUCC 125 MG IJ SOLR: 125.0000 mg | Freq: Once | INTRAMUSCULAR | Status: DC | PRN

## 2023-11-10 MED ORDER — IODIXANOL 320 MG/ML IV SOLN
INTRAVENOUS | Status: DC | PRN
  Administered 2023-11-10: 35 mL

## 2023-11-10 MED ORDER — FENTANYL CITRATE (PF) 100 MCG/2ML IJ SOLN
INTRAMUSCULAR | Status: AC
  Filled 2023-11-10: qty 2

## 2023-11-10 MED ORDER — ACETAMINOPHEN 325 MG PO TABS
ORAL_TABLET | ORAL | Status: AC
  Filled 2023-11-10: qty 2

## 2023-11-10 MED ORDER — HEPARIN (PORCINE) IN NACL 1000-0.9 UT/500ML-% IV SOLN
INTRAVENOUS | Status: DC | PRN
  Administered 2023-11-10: 1000 mL

## 2023-11-10 MED ORDER — ACETAMINOPHEN 325 MG PO TABS: 650.0000 mg | ORAL_TABLET | Freq: Once | ORAL | Status: DC

## 2023-11-10 MED ORDER — HEPARIN SODIUM (PORCINE) 1000 UNIT/ML IJ SOLN
INTRAMUSCULAR | Status: AC
  Filled 2023-11-10: qty 10

## 2023-11-10 MED ORDER — MIDAZOLAM HCL 2 MG/ML PO SYRP: 8.0000 mg | ORAL_SOLUTION | Freq: Once | ORAL | Status: DC | PRN

## 2023-11-10 MED ORDER — FENTANYL CITRATE (PF) 100 MCG/2ML IJ SOLN
INTRAMUSCULAR | Status: DC | PRN
  Administered 2023-11-10: 25 ug via INTRAVENOUS

## 2023-11-10 MED ORDER — FAMOTIDINE 20 MG PO TABS: 40.0000 mg | ORAL_TABLET | Freq: Once | ORAL | Status: DC | PRN

## 2023-11-10 MED ORDER — MIDAZOLAM HCL 5 MG/5ML IJ SOLN
INTRAMUSCULAR | Status: AC
  Filled 2023-11-10: qty 5

## 2023-11-10 MED ORDER — HYDROMORPHONE HCL 1 MG/ML IJ SOLN: 1.0000 mg | Freq: Once | INTRAMUSCULAR | Status: DC | PRN

## 2023-11-10 MED ORDER — LIDOCAINE HCL (PF) 1 % IJ SOLN
INTRAMUSCULAR | Status: DC | PRN
  Administered 2023-11-10: 10 mL

## 2023-11-10 MED ORDER — VANCOMYCIN HCL IN DEXTROSE 1-5 GM/200ML-% IV SOLN
1000.0000 mg | INTRAVENOUS | Status: AC
  Administered 2023-11-10: 1000 mg via INTRAVENOUS

## 2023-11-10 MED ORDER — MIDAZOLAM HCL 2 MG/2ML IJ SOLN
INTRAMUSCULAR | Status: DC | PRN
  Administered 2023-11-10: 1 mg via INTRAVENOUS

## 2023-11-10 MED ORDER — ONDANSETRON HCL 4 MG/2ML IJ SOLN: 4.0000 mg | Freq: Four times a day (QID) | INTRAMUSCULAR | Status: DC | PRN

## 2023-11-10 MED ORDER — VANCOMYCIN HCL IN DEXTROSE 1-5 GM/200ML-% IV SOLN
INTRAVENOUS | Status: AC
  Filled 2023-11-10: qty 200

## 2023-11-10 MED ORDER — SODIUM CHLORIDE 0.9 % IV SOLN: INTRAVENOUS | Status: DC

## 2023-11-10 SURGICAL SUPPLY — 14 items
CATH ANGIO 5F PIGTAIL 65CM (CATHETERS) IMPLANT
COVER PROBE ULTRASOUND 5X96 (MISCELLANEOUS) IMPLANT
DEVICE STARCLOSE SE CLOSURE (Vascular Products) IMPLANT
GLIDEWIRE ADV .035X260CM (WIRE) IMPLANT
GOWN STRL REUS W/ TWL LRG LVL3 (GOWN DISPOSABLE) ×1 IMPLANT
GOWN STRL REUS W/TWL LRG LVL3 (GOWN DISPOSABLE) ×1
NDL ENTRY 21GA 7CM ECHOTIP (NEEDLE) IMPLANT
NEEDLE ENTRY 21GA 7CM ECHOTIP (NEEDLE) ×1 IMPLANT
PACK ANGIOGRAPHY (CUSTOM PROCEDURE TRAY) ×1 IMPLANT
SET INTRO CAPELLA COAXIAL (SET/KITS/TRAYS/PACK) IMPLANT
SHEATH BRITE TIP 5FRX11 (SHEATH) IMPLANT
SYR MEDRAD MARK 7 150ML (SYRINGE) IMPLANT
TUBING CONTRAST HIGH PRESS 72 (TUBING) IMPLANT
WIRE GUIDERIGHT .035X150 (WIRE) IMPLANT

## 2023-11-10 NOTE — Op Note (Signed)
[facility]  Percutaneous Study/Intervention Procedural Note   Date of Surgery: 11/10/2023,11:24 AM  Surgeon:Jahzier Villalon, Latina Craver   Pre-operative Diagnosis: Atherosclerotic occlusive disease bilateral lower extremities with ulceration of the left foot  Post-operative diagnosis:  Same  Procedure(s) Performed:  1.  Abdominal aortogram  2.  Selective injection of the left lower extremity third order catheter placement  3.  Ultrasound-guided access to the right common femoral artery  4.  StarClose right femoral artery    Anesthesia: Conscious sedation was administered by the interventional radiology RN under my direct supervision. IV Versed plus fentanyl were utilized. Continuous ECG, pulse oximetry and blood pressure was monitored throughout the entire procedure.  Conscious sedation was administered for a total of 22 minutes.  Sheath: 5 French 11 cm Pinnacle sheath retrograde right common femoral  Contrast: 35 cc   Fluoroscopy Time: 2.9 minutes  Indications:  The patient presents to [facility] with atherosclerotic occlusive disease bilateral lower extremities with ulceration of the left foot.  Pedal pulses are nonpalpable bilaterally suggesting hemodynamically significant atherosclerotic occlusive disease.  This places the patient at increased risk for limb loss the risks and benefits as well as alternative therapies for lower extremity revascularization are reviewed with the patient all questions are answered the patient agrees to proceed.  The patient is therefore undergoing angiography with the hope for intervention for limb salvage.   Procedure:  [name] [name] a 89 y.o. male who was identified and appropriate procedural time out was performed.  The patient was then placed supine on the table and prepped and draped in the usual sterile fashion.  Ultrasound was used to evaluate the right common femoral artery.  It was echolucent and pulsatile indicating  it is patent .  An ultrasound image was acquired for the permanent record.  A micropuncture needle was used to access the right common femoral artery under direct ultrasound guidance.  The microwire was then advanced under fluoroscopic guidance without difficulty followed by the micro-sheath.  A 0.035 J wire was advanced without resistance and a 5Fr sheath was placed.    Pigtail catheter was then advanced to the level of T12 and AP projection of the aorta was obtained. Pigtail catheter was then repositioned to above the bifurcation and RAO view of the pelvis was obtained. Stiff angled Glidewire and pigtail catheter was then used across the bifurcation and the catheter was positioned in the distal external iliac artery.  LAO of the left groin was then obtained. Wire was reintroduced and negotiated into the SFA and the catheter was advanced into the SFA. Distal runoff was then performed.  After review of the images the catheter was removed over wire and an RAO view of the groin was obtained. StarClose device was deployed without difficulty.   Findings:   Aortogram: The abdominal aorta is opacified with a bolus injection contrast.  Single renal arteries are noted bilaterally with normal nephrograms.  No evidence of hemodynamically significant renal artery stenosis.  Although there is extensive calcific disease throughout the aorta and iliac arteries, there are no hemodynamically significant stenoses identified within the aorta.  The aortic bifurcation is extensively calcified and left diseased but widely patent.  Bilateral common internal and external iliac arteries are free of hemodynamically significant lesions.  Left Lower Extremity: The left common femoral and profunda femoris demonstrate bulky eccentric coral reef type calcifications that are near occlusive.  The origin of the superficial femoral also demonstrates eccentric hemodynamically significant stenosis (this is really just  an extension of the  common femoral lesion).  The mid and distal SFA as well as the popliteal throughout its entirety demonstrates bulky eccentric calcific disease with several areas of hemodynamically significant stenosis.  The previously placed popliteal artery stent demonstrates diffuse disease but it is patent there is a lesion at the level of the tibial plateau that is 60 to 70%.  There are numerous greater than 60% lesions throughout the distal SFA and popliteal.  The trifurcation is profoundly diseased the tibioperoneal trunk and posterior tibial are patent and they are the dominant runoff to the foot.  The peroneal contributes very little.  The anterior tibial is occluded throughout the majority of its course.    SUMMARY: Based on these images no intervention is performed at this time.  Unfortunately the degree of stenosis in the common femoral is significant and must be treated either initially or at the same time as his more distal disease.  Given this situation the patient should have a left common femoral endarterectomy with extension into the origin of the profunda femoris and then treatment of his SFA and popliteal disease with balloon angioplasty.  Given his age this may be a significant issue since it would entail a general anesthesia and a surgery lasting 3 to 4 hours.    Disposition: Patient was taken to the recovery room in stable condition having tolerated the procedure well.  [name] [name] [name] [name] 11/10/2023,11:24 AM

## 2023-11-10 NOTE — Interval H&P Note (Signed)
History and Physical Interval Note:  11/10/2023 9:26 AM  [name] [name]  has presented today for surgery, with the diagnosis of LLE Angio   ASO w ulceration.  The various methods of treatment have been discussed with the patient and family. After consideration of risks, benefits and other options for treatment, the patient has consented to  Procedure(s): Lower Extremity Angiography (Left) as a surgical intervention.  The patient's history has been reviewed, patient examined, no change in status, stable for surgery.  I have reviewed the patient's chart and labs.  Questions were answered to the patient's satisfaction.     Levora Dredge

## 2023-11-10 NOTE — Progress Notes (Signed)
Report called to Fleet Contras, RN at [facility].

## 2023-11-11 ENCOUNTER — Encounter: Payer: Self-pay | Admitting: Vascular Surgery

## 2023-11-12 DIAGNOSIS — N3080 Other cystitis without hematuria: Secondary | ICD-10-CM | POA: Diagnosis not present

## 2023-11-13 DIAGNOSIS — N3080 Other cystitis without hematuria: Secondary | ICD-10-CM | POA: Diagnosis not present

## 2023-11-19 DIAGNOSIS — D492 Neoplasm of unspecified behavior of bone, soft tissue, and skin: Secondary | ICD-10-CM | POA: Diagnosis not present

## 2023-11-19 DIAGNOSIS — E46 Unspecified protein-calorie malnutrition: Secondary | ICD-10-CM | POA: Diagnosis not present

## 2023-11-23 ENCOUNTER — Telehealth: Payer: Self-pay | Admitting: *Deleted

## 2023-11-23 DIAGNOSIS — E538 Deficiency of other specified B group vitamins: Secondary | ICD-10-CM | POA: Diagnosis not present

## 2023-11-23 DIAGNOSIS — E1151 Type 2 diabetes mellitus with diabetic peripheral angiopathy without gangrene: Secondary | ICD-10-CM | POA: Diagnosis not present

## 2023-11-23 DIAGNOSIS — Z0000 Encounter for general adult medical examination without abnormal findings: Secondary | ICD-10-CM | POA: Diagnosis not present

## 2023-11-23 DIAGNOSIS — L97529 Non-pressure chronic ulcer of other part of left foot with unspecified severity: Secondary | ICD-10-CM | POA: Diagnosis not present

## 2023-11-23 DIAGNOSIS — I739 Peripheral vascular disease, unspecified: Secondary | ICD-10-CM | POA: Diagnosis not present

## 2023-11-23 NOTE — Telephone Encounter (Signed)
Spoke to [place], appointment scheduled to see Dr. Rushie Chestnut 12/12. Verbalizes understanding. All concerns addressed. No questions

## 2023-11-23 NOTE — Telephone Encounter (Signed)
[name] [name] called reporting that the patients cancer is back and is asking what to do. Please advise He was a No Show for his 1 month post SBRT appointment 10/14/23

## 2023-11-25 DIAGNOSIS — L03116 Cellulitis of left lower limb: Secondary | ICD-10-CM | POA: Diagnosis not present

## 2023-11-25 DIAGNOSIS — I959 Hypotension, unspecified: Secondary | ICD-10-CM | POA: Diagnosis not present

## 2023-11-25 DIAGNOSIS — M6281 Muscle weakness (generalized): Secondary | ICD-10-CM | POA: Diagnosis not present

## 2023-11-25 DIAGNOSIS — I739 Peripheral vascular disease, unspecified: Secondary | ICD-10-CM | POA: Diagnosis not present

## 2023-11-25 DIAGNOSIS — N3000 Acute cystitis without hematuria: Secondary | ICD-10-CM | POA: Diagnosis not present

## 2023-11-28 ENCOUNTER — Emergency Department: Payer: Medicare HMO

## 2023-11-28 ENCOUNTER — Other Ambulatory Visit: Payer: Self-pay

## 2023-11-28 ENCOUNTER — Emergency Department
Admission: EM | Admit: 2023-11-28 | Discharge: 2023-11-28 | Disposition: A | Discharge status: Home / Self Care | Payer: Medicare HMO | Attending: Emergency Medicine | Admitting: Emergency Medicine

## 2023-11-28 DIAGNOSIS — Z743 Need for continuous supervision: Secondary | ICD-10-CM | POA: Diagnosis not present

## 2023-11-28 DIAGNOSIS — S81811A Laceration without foreign body, right lower leg, initial encounter: Secondary | ICD-10-CM | POA: Diagnosis not present

## 2023-11-28 DIAGNOSIS — W228XXA Striking against or struck by other objects, initial encounter: Secondary | ICD-10-CM | POA: Diagnosis not present

## 2023-11-28 DIAGNOSIS — W19XXXA Unspecified fall, initial encounter: Secondary | ICD-10-CM | POA: Diagnosis not present

## 2023-11-28 DIAGNOSIS — Z7901 Long term (current) use of anticoagulants: Secondary | ICD-10-CM | POA: Insufficient documentation

## 2023-11-28 DIAGNOSIS — I2510 Atherosclerotic heart disease of native coronary artery without angina pectoris: Secondary | ICD-10-CM | POA: Diagnosis not present

## 2023-11-28 DIAGNOSIS — I959 Hypotension, unspecified: Secondary | ICD-10-CM | POA: Diagnosis not present

## 2023-11-28 DIAGNOSIS — R58 Hemorrhage, not elsewhere classified: Secondary | ICD-10-CM | POA: Diagnosis not present

## 2023-11-28 DIAGNOSIS — S8991XA Unspecified injury of right lower leg, initial encounter: Secondary | ICD-10-CM | POA: Diagnosis present

## 2023-11-28 LAB — CBC WITH DIFFERENTIAL/PLATELET
Abs Immature Granulocytes: 0.04 10*3/uL (ref 0.00–0.07)
Basophils Absolute: 0.1 10*3/uL (ref 0.0–0.1)
Basophils Relative: 1 %
Eosinophils Absolute: 0.2 10*3/uL (ref 0.0–0.5)
Eosinophils Relative: 2 %
HCT: 25 % — ABNORMAL LOW (ref 39.0–52.0)
Hemoglobin: 8.4 g/dL — ABNORMAL LOW (ref 13.0–17.0)
Immature Granulocytes: 1 %
Lymphocytes Relative: 25 %
Lymphs Abs: 2.1 10*3/uL (ref 0.7–4.0)
MCH: 36.2 pg — ABNORMAL HIGH (ref 26.0–34.0)
MCHC: 33.6 g/dL (ref 30.0–36.0)
MCV: 107.8 fL — ABNORMAL HIGH (ref 80.0–100.0)
Monocytes Absolute: 1 10*3/uL (ref 0.1–1.0)
Monocytes Relative: 13 %
Neutro Abs: 4.8 10*3/uL (ref 1.7–7.7)
Neutrophils Relative %: 58 %
Platelets: 177 10*3/uL (ref 150–400)
RBC: 2.32 MIL/uL — ABNORMAL LOW (ref 4.22–5.81)
RDW: 21.2 % — ABNORMAL HIGH (ref 11.5–15.5)
Smear Review: NORMAL
WBC: 8.2 10*3/uL (ref 4.0–10.5)
nRBC: 0 % (ref 0.0–0.2)

## 2023-11-28 LAB — BASIC METABOLIC PANEL
Anion gap: 10 (ref 5–15)
BUN: 24 mg/dL — ABNORMAL HIGH (ref 8–23)
CO2: 23 mmol/L (ref 22–32)
Calcium: 8.5 mg/dL — ABNORMAL LOW (ref 8.9–10.3)
Chloride: 101 mmol/L (ref 98–111)
Creatinine, Ser: 1.01 mg/dL (ref 0.61–1.24)
GFR, Estimated: >60 mL/min (ref >60)
Glucose, Bld: 181 mg/dL — ABNORMAL HIGH (ref 70–99)
Potassium: 4 mmol/L (ref 3.5–5.1)
Sodium: 134 mmol/L — ABNORMAL LOW (ref 135–145)

## 2023-11-28 MED ORDER — ACETAMINOPHEN 325 MG PO TABS
650.0000 mg | ORAL_TABLET | Freq: Once | ORAL | Status: AC
  Administered 2023-11-28: 650 mg via ORAL
  Filled 2023-11-28: qty 2

## 2023-11-28 MED ORDER — TRANEXAMIC ACID FOR EPISTAXIS
500.0000 mg | Freq: Once | TOPICAL | Status: DC
  Filled 2023-11-28 (×2): qty 10

## 2023-11-28 MED ORDER — LIDOCAINE-EPINEPHRINE 2 %-1:100000 IJ SOLN
20.0000 mL | Freq: Once | INTRAMUSCULAR | Status: DC
  Filled 2023-11-28: qty 1

## 2023-11-28 NOTE — ED Provider Notes (Signed)
[facility] Provider Note    Event Date/Time   First MD Initiated Contact with Patient 11/28/23 1413     (approximate)   History   Extremity Laceration   HPI  [name] [name] is a 89 y.o. male   Past medical history of atrial fibrillation on Eliquis, CAD, here with a leg injury.  He was transferring with the assistance of his care staff when his right lower leg hit the side rails and started bleeding yesterday.  They put on a bandage.  It continued to soak through the bandages throughout the last 24 hours and continues to bleed today.  Thus he was brought to the emergency department.  No other injuries sustained.  Independent Historian contributed to assessment above: EMS reports as above     Physical Exam   Triage Vital Signs: ED Triage Vitals [11/28/23 1408]  Encounter Vitals Group     BP 116/76     Systolic BP Percentile      Diastolic BP Percentile      Pulse Rate 81     Resp 16     Temp 97.6 F (36.4 C)     Temp Source Oral     SpO2 100 %     Weight 135 lb 9.3 oz (61.5 kg)     Height 5\' 8"  (1.727 m)     Head Circumference      Peak Flow      Pain Score 10     Pain Loc      Pain Education      Exclude from Growth Chart     Most recent vital signs: Vitals:   11/28/23 1408  BP: 116/76  Pulse: 81  Resp: 16  Temp: 97.6 F (36.4 C)  SpO2: 100%    General: Awake, no distress.  CV:  Good peripheral perfusion.  Resp:  Normal effort.  Abd:  No distention.  Other:  Pleasant gentleman in no acute distress with normal vital signs.  There is oozing blood from a skin tear and small 2 cm laceration to the anterior right lower leg.  Distally neurovascular intact.  Ranging appropriately.   ED Results / Procedures / Treatments   Labs (all labs ordered are listed, but only abnormal results are displayed) Labs Reviewed  BASIC METABOLIC PANEL - Abnormal; Notable for the following components:      Result Value   Sodium 134 (*)     Glucose, Bld 181 (*)    BUN 24 (*)    Calcium 8.5 (*)    All other components within normal limits  CBC WITH DIFFERENTIAL/PLATELET - Abnormal; Notable for the following components:   RBC 2.32 (*)    Hemoglobin 8.4 (*)    HCT 25.0 (*)    MCV 107.8 (*)    MCH 36.2 (*)    RDW 21.2 (*)    All other components within normal limits     I ordered and reviewed the above labs they are notable for hemoglobin is unchanged from baseline   RADIOLOGY I independently reviewed and interpreted x-ray of the leg and see no obvious fracture or foreign body I also reviewed radiologist's formal read.   PROCEDURES:  Critical Care performed: No  Procedures    MEDICATIONS ORDERED IN ED: Medications  lidocaine-EPINEPHrine (XYLOCAINE W/EPI) 2 %-1:100000 (with pres) injection 20 mL (has no administration in time range)  tranexamic acid (CYKLOKAPRON) 1000 MG/10ML topical solution 500 mg (has no administration in time range)  IMPRESSION / MDM / ASSESSMENT AND PLAN / ED COURSE  I reviewed the triage vital signs and the nursing notes.                                Patient's presentation is most consistent with acute presentation with potential threat to life or bodily function.  Differential diagnosis includes, but is not limited to, skin tear, laceration, acute blood loss anemia   The patient is on the cardiac monitor to evaluate for evidence of arrhythmia and/or significant heart rate changes.  MDM:    The wound was anesthetized, cleansed, and hemostasis achieved with lidocaine/epinephrine injection, Surgicel, TXA bandage and pressure dressing.  Bandage intact, plan will be for discharge back to facility and wound care with PMD.  Return if worsening.       FINAL CLINICAL IMPRESSION(S) / ED DIAGNOSES   Final diagnoses:  Noninfected skin tear of right leg, initial encounter     Rx / DC Orders   ED Discharge Orders     None        Note:  This document was prepared using  Dragon voice recognition software and may include unintentional dictation errors.    Pilar Jarvis, MD 11/28/23 [phone]

## 2023-11-28 NOTE — ED Notes (Signed)
To return via EMS to home place on [address].

## 2023-11-28 NOTE — Discharge Instructions (Signed)
For the next 24 hours keep the dressing in place as the clotting agents and pressure will help stop the bleeding.  After that please keep your wound clean by washing at least daily with soap and water. If you see any signs of infection like spreading redness, pus coming from the wound, extreme pain, fevers, chills or any other worsening doctor right away or come back to the emergency department.  Thank you for choosing [place] for your health care today!  Please see your primary doctor this week for a follow up appointment.   If you have any new, worsening, or unexpected symptoms call your doctor right away or come back to the emergency department for reevaluation.  It was my pleasure to care for you today.   Daneil Dan Modesto Charon, MD

## 2023-11-28 NOTE — ED Triage Notes (Addendum)
Pt here via [facility] from [facility] after hitting his right leg during a transfer from the bed to the wheelchair on yesterday. Pt still bleeding when staff checked on him today, leg is wrapped in gauze and Coban. Pt also c/o chronic left leg pain as well. Pt is on Eliquis.   138/62

## 2023-12-01 DIAGNOSIS — I429 Cardiomyopathy, unspecified: Secondary | ICD-10-CM | POA: Diagnosis not present

## 2023-12-01 DIAGNOSIS — S81801D Unspecified open wound, right lower leg, subsequent encounter: Secondary | ICD-10-CM | POA: Diagnosis not present

## 2023-12-01 DIAGNOSIS — I110 Hypertensive heart disease with heart failure: Secondary | ICD-10-CM | POA: Diagnosis not present

## 2023-12-01 DIAGNOSIS — I70245 Atherosclerosis of native arteries of left leg with ulceration of other part of foot: Secondary | ICD-10-CM | POA: Diagnosis not present

## 2023-12-01 DIAGNOSIS — I2510 Atherosclerotic heart disease of native coronary artery without angina pectoris: Secondary | ICD-10-CM | POA: Diagnosis not present

## 2023-12-01 DIAGNOSIS — L97428 Non-pressure chronic ulcer of left heel and midfoot with other specified severity: Secondary | ICD-10-CM | POA: Diagnosis not present

## 2023-12-01 DIAGNOSIS — I70244 Atherosclerosis of native arteries of left leg with ulceration of heel and midfoot: Secondary | ICD-10-CM | POA: Diagnosis not present

## 2023-12-01 DIAGNOSIS — I509 Heart failure, unspecified: Secondary | ICD-10-CM | POA: Diagnosis not present

## 2023-12-01 DIAGNOSIS — L97521 Non-pressure chronic ulcer of other part of left foot limited to breakdown of skin: Secondary | ICD-10-CM | POA: Diagnosis not present

## 2023-12-04 DIAGNOSIS — I70244 Atherosclerosis of native arteries of left leg with ulceration of heel and midfoot: Secondary | ICD-10-CM | POA: Diagnosis not present

## 2023-12-04 DIAGNOSIS — L97428 Non-pressure chronic ulcer of left heel and midfoot with other specified severity: Secondary | ICD-10-CM | POA: Diagnosis not present

## 2023-12-04 DIAGNOSIS — I429 Cardiomyopathy, unspecified: Secondary | ICD-10-CM | POA: Diagnosis not present

## 2023-12-04 DIAGNOSIS — S81801D Unspecified open wound, right lower leg, subsequent encounter: Secondary | ICD-10-CM | POA: Diagnosis not present

## 2023-12-04 DIAGNOSIS — I110 Hypertensive heart disease with heart failure: Secondary | ICD-10-CM | POA: Diagnosis not present

## 2023-12-04 DIAGNOSIS — L97521 Non-pressure chronic ulcer of other part of left foot limited to breakdown of skin: Secondary | ICD-10-CM | POA: Diagnosis not present

## 2023-12-04 DIAGNOSIS — I70245 Atherosclerosis of native arteries of left leg with ulceration of other part of foot: Secondary | ICD-10-CM | POA: Diagnosis not present

## 2023-12-04 DIAGNOSIS — I2510 Atherosclerotic heart disease of native coronary artery without angina pectoris: Secondary | ICD-10-CM | POA: Diagnosis not present

## 2023-12-04 DIAGNOSIS — I509 Heart failure, unspecified: Secondary | ICD-10-CM | POA: Diagnosis not present

## 2023-12-08 DIAGNOSIS — I429 Cardiomyopathy, unspecified: Secondary | ICD-10-CM | POA: Diagnosis not present

## 2023-12-08 DIAGNOSIS — I2510 Atherosclerotic heart disease of native coronary artery without angina pectoris: Secondary | ICD-10-CM | POA: Diagnosis not present

## 2023-12-08 DIAGNOSIS — L97428 Non-pressure chronic ulcer of left heel and midfoot with other specified severity: Secondary | ICD-10-CM | POA: Diagnosis not present

## 2023-12-08 DIAGNOSIS — L97521 Non-pressure chronic ulcer of other part of left foot limited to breakdown of skin: Secondary | ICD-10-CM | POA: Diagnosis not present

## 2023-12-08 DIAGNOSIS — S81801D Unspecified open wound, right lower leg, subsequent encounter: Secondary | ICD-10-CM | POA: Diagnosis not present

## 2023-12-08 DIAGNOSIS — I70244 Atherosclerosis of native arteries of left leg with ulceration of heel and midfoot: Secondary | ICD-10-CM | POA: Diagnosis not present

## 2023-12-08 DIAGNOSIS — I509 Heart failure, unspecified: Secondary | ICD-10-CM | POA: Diagnosis not present

## 2023-12-08 DIAGNOSIS — I110 Hypertensive heart disease with heart failure: Secondary | ICD-10-CM | POA: Diagnosis not present

## 2023-12-08 DIAGNOSIS — I70245 Atherosclerosis of native arteries of left leg with ulceration of other part of foot: Secondary | ICD-10-CM | POA: Diagnosis not present

## 2023-12-09 ENCOUNTER — Telehealth (INDEPENDENT_AMBULATORY_CARE_PROVIDER_SITE_OTHER): Payer: Self-pay | Admitting: Vascular Surgery

## 2023-12-09 NOTE — Telephone Encounter (Signed)
Per Vivia Birmingham,  Patient did have a procedure but he needs to have a more extensive procedure done. Patient should come if they have any questions about the next procedure.   I called and [name] stated he will be here tomorrow.

## 2023-12-09 NOTE — Telephone Encounter (Signed)
Spoke with patient's daughter who does not feel that appointment is necessary, requested to speak with a nurse. Per [name] [name] op note it states that [name] [name] needs to see patient at appointment tomorrow, 12/10/23. Please advise

## 2023-12-09 NOTE — Progress Notes (Signed)
MRN : [id number]  [name] [name] is a 89 y.o. (March 02, 1929) male who presents with chief complaint of check circulation.  History of Present Illness:   The patient returns to the office for followup and review status post angiogram with intervention on 11/10/2023.   Procedure:  Selective injection of the left lower extremity third order catheter placement   Findings: Selective injection of the left lower extremity third order catheter placement   The patient notes worsening of his left foot wounds including the heel and worsening of his left leg rest pain symptoms. A new ulcers or wounds has occurred since the last visit on the dorsum of the left foot.  There have been no significant changes to the patient's overall health care.  No documented history of amaurosis fugax or recent TIA symptoms. There are no recent neurological changes noted. No documented history of DVT, PE or superficial thrombophlebitis. The patient denies recent episodes of angina or shortness of breath.     No outpatient medications have been marked as taking for the 12/10/23 encounter (Appointment) with Gilda Crease, Latina Craver, MD.    Past Medical History:  Diagnosis Date   A-fib [facility])    a.) CHA2DS2VASc = 5 (age x 2, CHF, HTN, vascular disease history);  b.) rate/rhythm maintained on oral metoprolol succinate chronically anticoagulated with apixaban   Aortic atherosclerosis (HCC)    B12 deficiency    CHF (congestive heart failure) (HCC)    a.) TTE 11/05/2012: EF 25-35%, LV apical thrombus, distal sep AK, mild MR; b.) TTE 05/10/2014: EF 30%, mild LVH, mod BAE, mild AR/PR, mod MR/TR; c.) TTE 03/12/2020: EF 20%, sev glob HK, mod BAE, mild LAE, mod RAE, triv PR, mild AR, sev MR/TR; d.) TTE 08/21/2021: EF 30%, sev glob HK, apical dyskinesis, mild LVH, mod BAE, triv AR/PR, mod MR, sev TR   Cor pulmonale (HCC)    Coronary artery disease    a.)  s/p 2v CABG 02/26/1993   Family history of adverse reaction to anesthesia    a.) PONV in 1st degree relative (daughter)   GERD (gastroesophageal reflux disease)    History of [place] spotted fever    HLD (hyperlipidemia)    HTN (hypertension), benign 04/25/2014   Hypothyroidism    Iron deficiency anemia    Long term current use of anticoagulant    a.) apixaban   Posterolateral myocardial infarction [facility])    Pulmonary HTN (HCC)    a.) TTE 05/10/2014: RVSP 48.6; b.) TTE 03/12/2020: RVSP 73.3   PVD (peripheral vascular disease) (HCC)    S/P CABG x 2 02/26/1993   a.) LIMA-LAD, SVG-OM1   Squamous cell carcinoma of skin 11/18/2021   right ear and mandible, [facility]   Squamous cell carcinoma of skin 03/18/2022   right mandible and ear/refer for Mohs/ Dr. Adriana Simas has referred pt to Dr. Nedra Hai in otolaryngology excised by Dr. Nedra Hai 05/03/22, Radiation with Dr. Rushie Chestnut   Type 2 diabetes mellitus with peripheral angiopathy (HCC) 11/10/2018    Past Surgical History:  Procedure Laterality Date   cancer removal   2023   below the  right base of ear at [facility]   COLONOSCOPY     CORONARY ARTERY BYPASS GRAFT N/A 02/26/1993   Procedure: CORONARY ARTERY BYPASS GRAFT; Location: [facility]; Surgeon: Marshell Garfinkel, MD   HERNIA REPAIR     LOWER EXTREMITY ANGIOGRAPHY Left 08/30/2021   Procedure: LOWER EXTREMITY ANGIOGRAPHY;  Surgeon: Renford Dills, MD;  Location: [facility] INVASIVE CV LAB;  Service: Cardiovascular;  Laterality: Left;   LOWER EXTREMITY ANGIOGRAPHY Left 11/10/2023   Procedure: Lower Extremity Angiography;  Surgeon: Renford Dills, MD;  Location: [facility] INVASIVE CV LAB;  Service: Cardiovascular;  Laterality: Left;    Social History Social History   Tobacco Use   Smoking status: Never   Smokeless tobacco: Never  Vaping Use   Vaping status: Never Used  Substance Use Topics   Alcohol use: No   Drug use: No    Family History Family History  Problem Relation Age of Onset   Hypertension Mother     Diabetes Brother    Stroke Brother    Heart attack Brother     Allergies  Allergen Reactions   Cephalexin Rash   Spironolactone Nausea Only   Codeine Rash and Other (See Comments)    Can not sleep   Doxycycline Rash     REVIEW OF SYSTEMS (Negative unless checked)  Constitutional: [] Weight loss  [] Fever  [] Chills Cardiac: [] Chest pain   [] Chest pressure   [] Palpitations   [] Shortness of breath when laying flat   [x] Shortness of breath with exertion. Vascular:  [x] Pain in legs with walking   [] Pain in legs at rest  [] History of DVT   [] Phlebitis   [] Swelling in legs   [] Varicose veins   [] Non-healing ulcers Pulmonary:   [] Uses home oxygen   [] Productive cough   [] Hemoptysis   [] Wheeze  [] COPD   [] Asthma Neurologic:  [] Dizziness   [] Seizures   [] History of stroke   [] History of TIA  [] Aphasia   [] Vissual changes   [] Weakness or numbness in arm   [] Weakness or numbness in leg Musculoskeletal:   [] Joint swelling   [x] Joint pain   [] Low back pain Hematologic:  [] Easy bruising  [] Easy bleeding   [] Hypercoagulable state   [] Anemic Gastrointestinal:  [] Diarrhea   [] Vomiting  [x] Gastroesophageal reflux/heartburn   [] Difficulty swallowing. Genitourinary:  [] Chronic kidney disease   [] Difficult urination  [] Frequent urination   [] Blood in urine Skin:  [x] Rashes   [x] Ulcers  Psychological:  [] History of anxiety   []  History of major depression.  Physical Examination  There were no vitals filed for this visit. There is no height or weight on file to calculate BMI. Gen: WD/WN, NAD Head: B[place]/AT, No temporalis wasting.  Ear/Nose/Throat: Hearing grossly intact, nares w/o erythema or drainage Eyes: PER, EOMI, sclera nonicteric.  Neck: Supple, no masses.  No bruit or JVD.  Pulmonary:  Good air movement, no audible wheezing, no use of accessory muscles.  Cardiac: RRR, normal S1, S2, no Murmurs. Vascular:  mild trophic changes, + open wounds left foot Vessel Right Left  Radial Palpable Palpable   PT Not Palpable Not Palpable  DP Not Palpable Not Palpable  Gastrointestinal: soft, non-distended. No guarding/no peritoneal signs.  Musculoskeletal: M/S 5/5 throughout.  No visible deformity.  Neurologic: CN 2-12 intact. Pain and light touch intact in extremities.  Symmetrical.  Speech is fluent. Motor exam as listed above. Psychiatric: Judgment intact, Mood & affect appropriate for pt's clinical situation. Dermatologic: No rashes or ulcers noted.  No changes consistent with cellulitis.   CBC Lab Results  Component Value Date   WBC 8.2 11/28/2023   HGB 8.4 (L) 11/28/2023   HCT 25.0 (L) 11/28/2023   MCV 107.8 (H) 11/28/2023   PLT 177 11/28/2023    BMET    Component Value Date/Time   NA 134 (L) 11/28/2023 1422   NA 141 02/04/2015 1001   K 4.0 11/28/2023 1422   K 4.2 02/04/2015 1001   CL 101 11/28/2023 1422   CL 106 02/04/2015 1001   CO2 23 11/28/2023 1422   CO2 30 02/04/2015 1001   GLUCOSE 181 (H) 11/28/2023 1422   GLUCOSE 122 (H) 02/04/2015 1001   BUN 24 (H) 11/28/2023 1422   BUN 19 (H) 02/04/2015 1001   CREATININE 1.01 11/28/2023 1422   CREATININE 1.26 02/04/2015 1001   CALCIUM 8.5 (L) 11/28/2023 1422   CALCIUM 8.5 02/04/2015 1001   GFRNONAA >60 11/28/2023 1422   GFRNONAA 58 (L) 02/04/2015 1001   GFRNONAA >60 11/06/2013 1304   GFRAA >60 02/05/2019 0121   GFRAA >60 02/04/2015 1001   GFRAA >60 11/06/2013 1304   Estimated Creatinine Clearance: 40.6 mL/min (by C-G formula based on SCr of 1.01 mg/dL).  COAG Lab Results  Component Value Date   INR 1.1 02/20/2023   INR 2.0 (H) 08/25/2021   INR 2.99 02/05/2019    Radiology DG Tibia/Fibula Right  Result Date: 11/28/2023 CLINICAL DATA:  Trauma to the right leg with laceration during transfer EXAM: RIGHT TIBIA AND FIBULA - 2 VIEW COMPARISON:  None Available. FINDINGS: There is no evidence of fracture or other focal bone lesions. Soft tissues are unremarkable. Extensive vascular calcifications. No radiopaque foreign  body. IMPRESSION: 1. No acute fracture or dislocation. No radiopaque foreign body. 2. Extensive vascular calcifications. Electronically Signed   By: Agustin Cree M.D.   On: 11/28/2023 14:51   PERIPHERAL VASCULAR CATHETERIZATION  Result Date: 11/10/2023 See surgical note for result.    Assessment/Plan 1. Atherosclerosis of native arteries of the extremities with ulceration (HCC) (Primary)  Recommend:  The patient has evidence of severe atherosclerotic changes of both lower extremities associated with ulceration and gangrene of the left foot.  This represents a limb threatening ischemia and places the patient at a high risk for limb loss.  Angiography has been performed and the situation is not ideal for intervention.  Given this finding open surgical repair is recommended.   Patient should undergo arterial reconstruction, left femoral endarterectomy with intervention of the left lower extremity with the hope for limb salvage.  The risks and benefits as well as the alternative therapies was discussed in detail with the patient.  All questions were answered.  Patient agrees to proceed with open vascular surgical reconstruction.  We are waiting for cardiac clearance.  The patient will follow up with me in the office after the procedure.   2. Lymphedema Recommend:  No surgery or intervention at this point in time.    I have reviewed my previous discussion with the patient regarding swelling and why it  causes symptoms.  The patient is doing well with compression and will continue wearing graduated compression on a daily basis. The patient will  continue wearing the compression first thing in the morning and removing them in the evening. The patient is instructed specifically not to sleep in the compression.    In addition, behavioral modification including elevation during the day and exercise as tolerated will be continued.    Patient should follow-up on an annual basis   3. HTN  (hypertension), benign Continue antihypertensive  medications as already ordered, these medications have been reviewed and there are no changes at this time.  4. Coronary artery disease of native artery of native heart with stable angina pectoris (HCC) Continue cardiac and antihypertensive medications as already ordered and reviewed, no changes at this time.  Continue statin as ordered and reviewed, no changes at this time  Nitrates PRN for chest pain  5. Type 2 diabetes mellitus with peripheral angiopathy (HCC) Continue hypoglycemic medications as already ordered, these medications have been reviewed and there are no changes at this time.  Hgb A1C to be monitored as already arranged by primary service  6. Mixed hyperlipidemia Continue statin as ordered and reviewed, no changes at this time    Levora Dredge, MD  12/09/2023 1:49 PM

## 2023-12-09 NOTE — Telephone Encounter (Signed)
Patient's daughter [name] [name] called because patient is scheduled for a post op tomorrow. Surgery in November never happened because vein was blocked at time of procedure. She would like to know if he needs the appointment for tomorrow or if this can be done virtually or if the patient is going to be back on the surgery schedule? Please advise.

## 2023-12-10 ENCOUNTER — Ambulatory Visit
Admission: RE | Admit: 2023-12-10 | Discharge: 2023-12-10 | Disposition: A | Discharge status: Home / Self Care | Payer: Medicare HMO | Source: Ambulatory Visit | Attending: Radiation Oncology | Admitting: Radiation Oncology

## 2023-12-10 ENCOUNTER — Encounter (INDEPENDENT_AMBULATORY_CARE_PROVIDER_SITE_OTHER): Payer: Self-pay | Admitting: Vascular Surgery

## 2023-12-10 ENCOUNTER — Ambulatory Visit (INDEPENDENT_AMBULATORY_CARE_PROVIDER_SITE_OTHER): Payer: Medicare HMO | Admitting: Vascular Surgery

## 2023-12-10 VITALS — BP 112/51 | HR 70 | Temp 98.0°F | Resp 18

## 2023-12-10 VITALS — BP 117/65 | HR 79 | Resp 18 | Ht 67.0 in | Wt 135.0 lb

## 2023-12-10 DIAGNOSIS — R221 Localized swelling, mass and lump, neck: Secondary | ICD-10-CM | POA: Diagnosis not present

## 2023-12-10 DIAGNOSIS — E1151 Type 2 diabetes mellitus with diabetic peripheral angiopathy without gangrene: Secondary | ICD-10-CM

## 2023-12-10 DIAGNOSIS — I890 Lymphedema, not elsewhere classified: Secondary | ICD-10-CM

## 2023-12-10 DIAGNOSIS — E782 Mixed hyperlipidemia: Secondary | ICD-10-CM | POA: Diagnosis not present

## 2023-12-10 DIAGNOSIS — I739 Peripheral vascular disease, unspecified: Secondary | ICD-10-CM | POA: Diagnosis not present

## 2023-12-10 DIAGNOSIS — I10 Essential (primary) hypertension: Secondary | ICD-10-CM

## 2023-12-10 DIAGNOSIS — I25118 Atherosclerotic heart disease of native coronary artery with other forms of angina pectoris: Secondary | ICD-10-CM | POA: Diagnosis not present

## 2023-12-10 DIAGNOSIS — I7025 Atherosclerosis of native arteries of other extremities with ulceration: Secondary | ICD-10-CM

## 2023-12-10 DIAGNOSIS — C4442 Squamous cell carcinoma of skin of scalp and neck: Secondary | ICD-10-CM | POA: Diagnosis not present

## 2023-12-10 DIAGNOSIS — C770 Secondary and unspecified malignant neoplasm of lymph nodes of head, face and neck: Secondary | ICD-10-CM | POA: Diagnosis not present

## 2023-12-10 DIAGNOSIS — I96 Gangrene, not elsewhere classified: Secondary | ICD-10-CM | POA: Diagnosis not present

## 2023-12-10 NOTE — Progress Notes (Signed)
Radiation Oncology Follow up Note  Name: [name] [name]   Date:   12/10/2023 MRN:  [id number] DOB: 1929-10-05    This 89 y.o. male presents to the clinic today for reevaluation of right subdigastric mass previously treated with SBRT back in August for recurrent squamous cell carcinoma.Marland Kitchen  REFERRING PROVIDER: [name], MD  HPI: Patient is a 89 year old male previously treated back in 2023 for squamous cell carcinoma of the skin with adenopathy in the right neck.  He developed a nodule in the right subdigastric region which was biopsy positive again for squamous cell carcinoma.  We treated that with SBRT 30 Gray in 5 fractions.  Initially had a complete response although recently according to daughter areas have been progressing is oozing somewhat.  Patient has multiple medical comorbidities.  Right now being cared for by vascular surgery a right lower leg trauma.  Last month he had vascular surgery treat him for atherosclerotic occlusive disease in the bilateral lower extremities with ulceration of his left foot.  He is seen today the area is nontender although it is bandaged and oozing somewhat on inspection.  COMPLICATIONS OF TREATMENT: none  FOLLOW UP COMPLIANCE: keeps appointments   PHYSICAL EXAM:  BP (!) 112/51   Pulse 70   Temp 98 F (36.7 C)   Resp 18  Patient has a fixed mass in the same area of previous treatment red and somewhat tender to the touch.  No other adenopathy in the head and neck region is noted oral cavity is clear.  [place] male wheelchair-bound.  Cardiac examination essentially unremarkable abdomen is benign lungs are clear.  RADIOLOGY RESULTS: Previous scans reviewed as well as area of treatment fields  PLAN: At this time [facility] to do a biopsy to make sure were not dealing with infection rather than repeat again recurrent squamous cell carcinoma this would be unusual after SBRT treatment.  If this is persistent disease can we radiate this area.  I  have asked to see him back in the middle of January hopefully at that time he will have ENT evaluation and possible biopsy.  Patient has multiple medical core manage comorbidities he is dealing with at this time.  He is frail.  Daughter comprehends my recommendations well.  I would like to take this opportunity to thank you for allowing me to participate in the care of your patient.Carmina Miller, MD

## 2023-12-11 ENCOUNTER — Encounter (INDEPENDENT_AMBULATORY_CARE_PROVIDER_SITE_OTHER): Payer: Self-pay | Admitting: Vascular Surgery

## 2023-12-11 DIAGNOSIS — I429 Cardiomyopathy, unspecified: Secondary | ICD-10-CM | POA: Diagnosis not present

## 2023-12-11 DIAGNOSIS — L97428 Non-pressure chronic ulcer of left heel and midfoot with other specified severity: Secondary | ICD-10-CM | POA: Diagnosis not present

## 2023-12-11 DIAGNOSIS — S81801D Unspecified open wound, right lower leg, subsequent encounter: Secondary | ICD-10-CM | POA: Diagnosis not present

## 2023-12-11 DIAGNOSIS — I110 Hypertensive heart disease with heart failure: Secondary | ICD-10-CM | POA: Diagnosis not present

## 2023-12-11 DIAGNOSIS — I509 Heart failure, unspecified: Secondary | ICD-10-CM | POA: Diagnosis not present

## 2023-12-11 DIAGNOSIS — I70245 Atherosclerosis of native arteries of left leg with ulceration of other part of foot: Secondary | ICD-10-CM | POA: Diagnosis not present

## 2023-12-11 DIAGNOSIS — I70244 Atherosclerosis of native arteries of left leg with ulceration of heel and midfoot: Secondary | ICD-10-CM | POA: Diagnosis not present

## 2023-12-11 DIAGNOSIS — I2510 Atherosclerotic heart disease of native coronary artery without angina pectoris: Secondary | ICD-10-CM | POA: Diagnosis not present

## 2023-12-11 DIAGNOSIS — L97521 Non-pressure chronic ulcer of other part of left foot limited to breakdown of skin: Secondary | ICD-10-CM | POA: Diagnosis not present

## 2023-12-12 DIAGNOSIS — I70244 Atherosclerosis of native arteries of left leg with ulceration of heel and midfoot: Secondary | ICD-10-CM | POA: Diagnosis not present

## 2023-12-12 DIAGNOSIS — I429 Cardiomyopathy, unspecified: Secondary | ICD-10-CM | POA: Diagnosis not present

## 2023-12-12 DIAGNOSIS — L97428 Non-pressure chronic ulcer of left heel and midfoot with other specified severity: Secondary | ICD-10-CM | POA: Diagnosis not present

## 2023-12-12 DIAGNOSIS — I509 Heart failure, unspecified: Secondary | ICD-10-CM | POA: Diagnosis not present

## 2023-12-12 DIAGNOSIS — I2510 Atherosclerotic heart disease of native coronary artery without angina pectoris: Secondary | ICD-10-CM | POA: Diagnosis not present

## 2023-12-12 DIAGNOSIS — S81801D Unspecified open wound, right lower leg, subsequent encounter: Secondary | ICD-10-CM | POA: Diagnosis not present

## 2023-12-12 DIAGNOSIS — I70245 Atherosclerosis of native arteries of left leg with ulceration of other part of foot: Secondary | ICD-10-CM | POA: Diagnosis not present

## 2023-12-12 DIAGNOSIS — I110 Hypertensive heart disease with heart failure: Secondary | ICD-10-CM | POA: Diagnosis not present

## 2023-12-12 DIAGNOSIS — L97521 Non-pressure chronic ulcer of other part of left foot limited to breakdown of skin: Secondary | ICD-10-CM | POA: Diagnosis not present

## 2023-12-14 DIAGNOSIS — I420 Dilated cardiomyopathy: Secondary | ICD-10-CM | POA: Diagnosis not present

## 2023-12-14 DIAGNOSIS — I10 Essential (primary) hypertension: Secondary | ICD-10-CM | POA: Diagnosis not present

## 2023-12-14 DIAGNOSIS — E1151 Type 2 diabetes mellitus with diabetic peripheral angiopathy without gangrene: Secondary | ICD-10-CM | POA: Diagnosis not present

## 2023-12-14 DIAGNOSIS — Z951 Presence of aortocoronary bypass graft: Secondary | ICD-10-CM | POA: Diagnosis not present

## 2023-12-14 DIAGNOSIS — I2510 Atherosclerotic heart disease of native coronary artery without angina pectoris: Secondary | ICD-10-CM | POA: Diagnosis not present

## 2023-12-14 DIAGNOSIS — I872 Venous insufficiency (chronic) (peripheral): Secondary | ICD-10-CM | POA: Diagnosis not present

## 2023-12-14 DIAGNOSIS — Z01811 Encounter for preprocedural respiratory examination: Secondary | ICD-10-CM | POA: Diagnosis not present

## 2023-12-14 DIAGNOSIS — Z23 Encounter for immunization: Secondary | ICD-10-CM | POA: Diagnosis not present

## 2023-12-14 DIAGNOSIS — I4891 Unspecified atrial fibrillation: Secondary | ICD-10-CM | POA: Diagnosis not present

## 2023-12-15 ENCOUNTER — Telehealth (INDEPENDENT_AMBULATORY_CARE_PROVIDER_SITE_OTHER): Payer: Self-pay

## 2023-12-15 DIAGNOSIS — L97521 Non-pressure chronic ulcer of other part of left foot limited to breakdown of skin: Secondary | ICD-10-CM | POA: Diagnosis not present

## 2023-12-15 DIAGNOSIS — I2510 Atherosclerotic heart disease of native coronary artery without angina pectoris: Secondary | ICD-10-CM | POA: Diagnosis not present

## 2023-12-15 DIAGNOSIS — L97428 Non-pressure chronic ulcer of left heel and midfoot with other specified severity: Secondary | ICD-10-CM | POA: Diagnosis not present

## 2023-12-15 DIAGNOSIS — S81801D Unspecified open wound, right lower leg, subsequent encounter: Secondary | ICD-10-CM | POA: Diagnosis not present

## 2023-12-15 DIAGNOSIS — I70245 Atherosclerosis of native arteries of left leg with ulceration of other part of foot: Secondary | ICD-10-CM | POA: Diagnosis not present

## 2023-12-15 DIAGNOSIS — I110 Hypertensive heart disease with heart failure: Secondary | ICD-10-CM | POA: Diagnosis not present

## 2023-12-15 DIAGNOSIS — I509 Heart failure, unspecified: Secondary | ICD-10-CM | POA: Diagnosis not present

## 2023-12-15 DIAGNOSIS — I429 Cardiomyopathy, unspecified: Secondary | ICD-10-CM | POA: Diagnosis not present

## 2023-12-15 DIAGNOSIS — I70244 Atherosclerosis of native arteries of left leg with ulceration of heel and midfoot: Secondary | ICD-10-CM | POA: Diagnosis not present

## 2023-12-15 NOTE — Telephone Encounter (Signed)
Spoke with the patient's daughter and let her know that because insurance start new the first of the year, I will call to get him scheduled at that time. Patient's daughter understood.

## 2023-12-16 DIAGNOSIS — I70244 Atherosclerosis of native arteries of left leg with ulceration of heel and midfoot: Secondary | ICD-10-CM | POA: Diagnosis not present

## 2023-12-16 DIAGNOSIS — L97428 Non-pressure chronic ulcer of left heel and midfoot with other specified severity: Secondary | ICD-10-CM | POA: Diagnosis not present

## 2023-12-16 DIAGNOSIS — I110 Hypertensive heart disease with heart failure: Secondary | ICD-10-CM | POA: Diagnosis not present

## 2023-12-16 DIAGNOSIS — I70245 Atherosclerosis of native arteries of left leg with ulceration of other part of foot: Secondary | ICD-10-CM | POA: Diagnosis not present

## 2023-12-16 DIAGNOSIS — I2510 Atherosclerotic heart disease of native coronary artery without angina pectoris: Secondary | ICD-10-CM | POA: Diagnosis not present

## 2023-12-16 DIAGNOSIS — L97521 Non-pressure chronic ulcer of other part of left foot limited to breakdown of skin: Secondary | ICD-10-CM | POA: Diagnosis not present

## 2023-12-16 DIAGNOSIS — I429 Cardiomyopathy, unspecified: Secondary | ICD-10-CM | POA: Diagnosis not present

## 2023-12-16 DIAGNOSIS — I509 Heart failure, unspecified: Secondary | ICD-10-CM | POA: Diagnosis not present

## 2023-12-16 DIAGNOSIS — S81801D Unspecified open wound, right lower leg, subsequent encounter: Secondary | ICD-10-CM | POA: Diagnosis not present

## 2023-12-18 DIAGNOSIS — L97428 Non-pressure chronic ulcer of left heel and midfoot with other specified severity: Secondary | ICD-10-CM | POA: Diagnosis not present

## 2023-12-18 DIAGNOSIS — I509 Heart failure, unspecified: Secondary | ICD-10-CM | POA: Diagnosis not present

## 2023-12-18 DIAGNOSIS — I70245 Atherosclerosis of native arteries of left leg with ulceration of other part of foot: Secondary | ICD-10-CM | POA: Diagnosis not present

## 2023-12-18 DIAGNOSIS — I110 Hypertensive heart disease with heart failure: Secondary | ICD-10-CM | POA: Diagnosis not present

## 2023-12-18 DIAGNOSIS — L97521 Non-pressure chronic ulcer of other part of left foot limited to breakdown of skin: Secondary | ICD-10-CM | POA: Diagnosis not present

## 2023-12-18 DIAGNOSIS — I429 Cardiomyopathy, unspecified: Secondary | ICD-10-CM | POA: Diagnosis not present

## 2023-12-18 DIAGNOSIS — I2510 Atherosclerotic heart disease of native coronary artery without angina pectoris: Secondary | ICD-10-CM | POA: Diagnosis not present

## 2023-12-18 DIAGNOSIS — S81801D Unspecified open wound, right lower leg, subsequent encounter: Secondary | ICD-10-CM | POA: Diagnosis not present

## 2023-12-18 DIAGNOSIS — I70244 Atherosclerosis of native arteries of left leg with ulceration of heel and midfoot: Secondary | ICD-10-CM | POA: Diagnosis not present

## 2023-12-19 DIAGNOSIS — I429 Cardiomyopathy, unspecified: Secondary | ICD-10-CM | POA: Diagnosis not present

## 2023-12-19 DIAGNOSIS — I70245 Atherosclerosis of native arteries of left leg with ulceration of other part of foot: Secondary | ICD-10-CM | POA: Diagnosis not present

## 2023-12-19 DIAGNOSIS — I2510 Atherosclerotic heart disease of native coronary artery without angina pectoris: Secondary | ICD-10-CM | POA: Diagnosis not present

## 2023-12-19 DIAGNOSIS — I110 Hypertensive heart disease with heart failure: Secondary | ICD-10-CM | POA: Diagnosis not present

## 2023-12-19 DIAGNOSIS — S81801D Unspecified open wound, right lower leg, subsequent encounter: Secondary | ICD-10-CM | POA: Diagnosis not present

## 2023-12-19 DIAGNOSIS — L97428 Non-pressure chronic ulcer of left heel and midfoot with other specified severity: Secondary | ICD-10-CM | POA: Diagnosis not present

## 2023-12-19 DIAGNOSIS — I70244 Atherosclerosis of native arteries of left leg with ulceration of heel and midfoot: Secondary | ICD-10-CM | POA: Diagnosis not present

## 2023-12-19 DIAGNOSIS — I509 Heart failure, unspecified: Secondary | ICD-10-CM | POA: Diagnosis not present

## 2023-12-19 DIAGNOSIS — L97521 Non-pressure chronic ulcer of other part of left foot limited to breakdown of skin: Secondary | ICD-10-CM | POA: Diagnosis not present

## 2023-12-22 DIAGNOSIS — I110 Hypertensive heart disease with heart failure: Secondary | ICD-10-CM | POA: Diagnosis not present

## 2023-12-22 DIAGNOSIS — S81801D Unspecified open wound, right lower leg, subsequent encounter: Secondary | ICD-10-CM | POA: Diagnosis not present

## 2023-12-22 DIAGNOSIS — I509 Heart failure, unspecified: Secondary | ICD-10-CM | POA: Diagnosis not present

## 2023-12-22 DIAGNOSIS — I2510 Atherosclerotic heart disease of native coronary artery without angina pectoris: Secondary | ICD-10-CM | POA: Diagnosis not present

## 2023-12-22 DIAGNOSIS — I70244 Atherosclerosis of native arteries of left leg with ulceration of heel and midfoot: Secondary | ICD-10-CM | POA: Diagnosis not present

## 2023-12-22 DIAGNOSIS — I429 Cardiomyopathy, unspecified: Secondary | ICD-10-CM | POA: Diagnosis not present

## 2023-12-22 DIAGNOSIS — L97428 Non-pressure chronic ulcer of left heel and midfoot with other specified severity: Secondary | ICD-10-CM | POA: Diagnosis not present

## 2023-12-22 DIAGNOSIS — L97521 Non-pressure chronic ulcer of other part of left foot limited to breakdown of skin: Secondary | ICD-10-CM | POA: Diagnosis not present

## 2023-12-22 DIAGNOSIS — I70245 Atherosclerosis of native arteries of left leg with ulceration of other part of foot: Secondary | ICD-10-CM | POA: Diagnosis not present

## 2023-12-25 DIAGNOSIS — S81801A Unspecified open wound, right lower leg, initial encounter: Secondary | ICD-10-CM | POA: Diagnosis not present

## 2023-12-25 DIAGNOSIS — L03115 Cellulitis of right lower limb: Secondary | ICD-10-CM | POA: Diagnosis not present

## 2023-12-26 DIAGNOSIS — I70245 Atherosclerosis of native arteries of left leg with ulceration of other part of foot: Secondary | ICD-10-CM | POA: Diagnosis not present

## 2023-12-26 DIAGNOSIS — I509 Heart failure, unspecified: Secondary | ICD-10-CM | POA: Diagnosis not present

## 2023-12-26 DIAGNOSIS — L97428 Non-pressure chronic ulcer of left heel and midfoot with other specified severity: Secondary | ICD-10-CM | POA: Diagnosis not present

## 2023-12-26 DIAGNOSIS — S81801D Unspecified open wound, right lower leg, subsequent encounter: Secondary | ICD-10-CM | POA: Diagnosis not present

## 2023-12-26 DIAGNOSIS — I2510 Atherosclerotic heart disease of native coronary artery without angina pectoris: Secondary | ICD-10-CM | POA: Diagnosis not present

## 2023-12-26 DIAGNOSIS — I70244 Atherosclerosis of native arteries of left leg with ulceration of heel and midfoot: Secondary | ICD-10-CM | POA: Diagnosis not present

## 2023-12-26 DIAGNOSIS — I429 Cardiomyopathy, unspecified: Secondary | ICD-10-CM | POA: Diagnosis not present

## 2023-12-26 DIAGNOSIS — I110 Hypertensive heart disease with heart failure: Secondary | ICD-10-CM | POA: Diagnosis not present

## 2023-12-26 DIAGNOSIS — L97521 Non-pressure chronic ulcer of other part of left foot limited to breakdown of skin: Secondary | ICD-10-CM | POA: Diagnosis not present

## 2023-12-29 DIAGNOSIS — L97428 Non-pressure chronic ulcer of left heel and midfoot with other specified severity: Secondary | ICD-10-CM | POA: Diagnosis not present

## 2023-12-29 DIAGNOSIS — I70245 Atherosclerosis of native arteries of left leg with ulceration of other part of foot: Secondary | ICD-10-CM | POA: Diagnosis not present

## 2023-12-29 DIAGNOSIS — I2510 Atherosclerotic heart disease of native coronary artery without angina pectoris: Secondary | ICD-10-CM | POA: Diagnosis not present

## 2023-12-29 DIAGNOSIS — S81801D Unspecified open wound, right lower leg, subsequent encounter: Secondary | ICD-10-CM | POA: Diagnosis not present

## 2023-12-29 DIAGNOSIS — L97521 Non-pressure chronic ulcer of other part of left foot limited to breakdown of skin: Secondary | ICD-10-CM | POA: Diagnosis not present

## 2023-12-29 DIAGNOSIS — I110 Hypertensive heart disease with heart failure: Secondary | ICD-10-CM | POA: Diagnosis not present

## 2023-12-29 DIAGNOSIS — I509 Heart failure, unspecified: Secondary | ICD-10-CM | POA: Diagnosis not present

## 2023-12-29 DIAGNOSIS — I70244 Atherosclerosis of native arteries of left leg with ulceration of heel and midfoot: Secondary | ICD-10-CM | POA: Diagnosis not present

## 2023-12-29 DIAGNOSIS — I429 Cardiomyopathy, unspecified: Secondary | ICD-10-CM | POA: Diagnosis not present

## 2023-12-30 DIAGNOSIS — I110 Hypertensive heart disease with heart failure: Secondary | ICD-10-CM | POA: Diagnosis not present

## 2023-12-30 DIAGNOSIS — L97521 Non-pressure chronic ulcer of other part of left foot limited to breakdown of skin: Secondary | ICD-10-CM | POA: Diagnosis not present

## 2023-12-30 DIAGNOSIS — S81801D Unspecified open wound, right lower leg, subsequent encounter: Secondary | ICD-10-CM | POA: Diagnosis not present

## 2023-12-30 DIAGNOSIS — I2510 Atherosclerotic heart disease of native coronary artery without angina pectoris: Secondary | ICD-10-CM | POA: Diagnosis not present

## 2023-12-30 DIAGNOSIS — I70244 Atherosclerosis of native arteries of left leg with ulceration of heel and midfoot: Secondary | ICD-10-CM | POA: Diagnosis not present

## 2023-12-30 DIAGNOSIS — I509 Heart failure, unspecified: Secondary | ICD-10-CM | POA: Diagnosis not present

## 2023-12-30 DIAGNOSIS — L97428 Non-pressure chronic ulcer of left heel and midfoot with other specified severity: Secondary | ICD-10-CM | POA: Diagnosis not present

## 2023-12-30 DIAGNOSIS — I70245 Atherosclerosis of native arteries of left leg with ulceration of other part of foot: Secondary | ICD-10-CM | POA: Diagnosis not present

## 2023-12-30 DIAGNOSIS — I429 Cardiomyopathy, unspecified: Secondary | ICD-10-CM | POA: Diagnosis not present

## 2024-01-07 ENCOUNTER — Telehealth (INDEPENDENT_AMBULATORY_CARE_PROVIDER_SITE_OTHER): Payer: Self-pay

## 2024-01-07 NOTE — Telephone Encounter (Signed)
 Spoke with the patient's daughter and he is scheduled with Dr. Jama on 01/20/24 for a left femoral endarterectomy with intervention at the [facility]. Pre-op is on 01/12/24 at 11:00 am. Patient's daughter stated she will call to  to get a different date. Pre-surgical instructions were discussed and will be sent to Mychart and mailed.

## 2024-01-08 ENCOUNTER — Other Ambulatory Visit (INDEPENDENT_AMBULATORY_CARE_PROVIDER_SITE_OTHER): Payer: Self-pay | Admitting: Nurse Practitioner

## 2024-01-08 ENCOUNTER — Encounter: Payer: Self-pay | Admitting: Urgent Care

## 2024-01-08 DIAGNOSIS — I7025 Atherosclerosis of native arteries of other extremities with ulceration: Secondary | ICD-10-CM

## 2024-01-08 DIAGNOSIS — Z01812 Encounter for preprocedural laboratory examination: Secondary | ICD-10-CM

## 2024-01-10 ENCOUNTER — Encounter: Payer: Self-pay | Admitting: Vascular Surgery

## 2024-01-10 NOTE — Progress Notes (Signed)
 Perioperative Services Pre-Admission/Anesthesia Testing    Date: 01/10/24  Name: [name] [name] MRN:   [id number]  Re: Severe pulmonary HTN; need for higher level of care  Planned Surgical Procedure(s):    Case: [id number] Date/Time: 01/20/24 0715   Procedures:      ENDARTERECTOMY FEMORAL (WITH INTERVENTION) (Left)     APPLICATION OF CELL SAVER   Anesthesia type: General   Pre-op diagnosis: ASO WITH GANGRENE   Location: [facility] OR ROOM 08 / [facility] ORS FOR ANESTHESIA GROUP   Surgeons: Jama Cordella MATSU, MD      Clinical Notes:  [name] [name] scheduled for a LEFT FEMORAL ENDARTERECTOMY on 01/20/2024 with Dr. Cordella Jama, MD. Chart was referred to me for preoperative review in the setting of patient with known cardiovascular disease. Patient has a history of CAD (s/p CABG), atrial fibrillation/flutter, ischemic cardiomyopathy, CHF (NYHA class IV), PAD, pulmonary hypertension, HTN, HLD, T2DM  Patient is being followed by the cardiology service line at [facility], where his care is being managed by Dr. Marsa Dooms, MD. Cardiology service has discussed need for consideration of AICD placement in the setting of patient with ischemic cardiomyopathy. Despite recommendations, patient reluctant. As previously mentioned, patient with a significant cardiovascular history.   Patient suffered a posterolateral MI on 02/25/1993.   Diagnostic LEFT heart catheterization was performed here at [facility] by Dr. Marsa Dooms, MD. Study revealed multi-vessel CAD; 95% pLAD, 75% OM1, 50% OM3, 25-50% pRCA, 50% large anterolateral lesion. Given the complexity of patient's coronary artery disease, he was transferred to [facility] center for revascularization.   Patient underwent 2 vessel revascularization procedure on 02/26/1993 performed by Dr. Maude Sharps, MD at [facility]. LIMA-LAD and SVG-OM1 bypass grafts were placed.  Procedure complicated by development of atrial fibrillation/flutter on POD#4, which was treated with loading doses digoxin + procainamide, followed by Procaine SR every 6 hours. POD#5, patient with persistent atrial arrhythmia despite interventions. Diltiazem gtt was added. Interventions did produce sufficient rate control, however he remained in atrial fibrillation/flutter. Patient cardioverted to NSR on 03/05/1993 and was discharged home the following day (03/06/1993).   Patient with refractory atrial arrhythmia. CHA2DS2-VASc Score = 5 (age x 2, HTN, CHF, previous MI/vascular disease history). Cardiac rate and rhythm currently being maintained on oral metoprolol . He is chronically anticoagulated on dose reduced DOAC (apixaban ) therapy with no evidence or reports of GI/GU related bleeding.   Most recent TTE was performed on 09/25/2023 revealing a moderately reduced left ventricular systolic function with an EF of 35-40%. The apical lateral segment, mid-anterolateral segment, basal inferoseptal segment, and apex were all noted to be akinetic. Additionally, there was hypokinesis of the entire anterior wall, entire anterior septum, inferior wall, posterior wall, basal anterolateral segment, and inferolateral segment. No LVH. Left ventricular diastolic Doppler parameters consistent with pseudonormalization (G2DD). There was severe biatrial enlargement. Right ventricular size was moderately enlarged with mildly reduced systolic function. Severe elevated pulmonary artery pressures noted with an estimated RVSP of 66.3 mmHg. Significantly elevated RVSP consistent with known severe pulmonary hypertension diagnosis. Mild mitral valve regurgitation observed. Aortic valve was mildly calcified. All transvalvular gradients were noted to be normal providing no evidence suggestive of valvular stenosis. Aorta normal in size with no evidence of aneurysmal dilatation.  Case details discussed with attending anesthesiologist on  call Bernette, MD). We discussed previous hernia case back in May, which ultimately had to be referred out to tertiary care setting for the same reason. Reviewed clinical  concerns and limited care capabilities here at [facility].   Impression and Plan:  [name] [name] with complex cardiovascular diagnoses. He has a moderately reduced EF in the setting of an ischemic cardiomyopathy and resulting NYHA class IV CHF. Patient has atrial fibrillation. Of most concern is his cor pulmonale diagnosis, which is secondary to severe pulmonary hypertension. PA pressures indicative of severe disease. I have concerns that this patient will require a higher level of care for the planned vascular intervention scheduled in the coming days. Typically, patients with severe pulmonary hypertension require invasive intraoperative hemodynamic monitoring and treatment with iNO (inhaled nitrous oxide), which unfortunately are not things that we have available here at [facility].    Case was discussed with on call anesthesia attending (Piscitello, MD), who ultimately agreed with my assessment and concerns. Anesthesia service recommending transfer of patient's care to a tertiary care center that is better equipped with personnel and resources required for complex cardiovascular/cardiopulmonary care services. Provisions for cardiac anesthesia needs to be in place in efforts to promote and ensure a safe and effective anesthetic course for this patient's procedure. I have reached out to surgeon's office to make them aware; vascular APP Luna, NP-C) to discuss with surgical team. Message sent to surgery scheduler to make them aware of need for care transfer.  Copy of note will also sent to primary attending surgeon.  Dorise Pereyra, MSN, APRN, FNP-C, [facility]  Perioperative Services Nurse Practitioner Phone: [phone] Fax: [phone] 01/10/24 2:23 PM  NOTE: This note has been prepared using Dragon dictation software. Despite my best ability to proofread, there is always the potential that unintentional transcriptional errors may still occur from this process.

## 2024-01-11 ENCOUNTER — Telehealth (INDEPENDENT_AMBULATORY_CARE_PROVIDER_SITE_OTHER): Payer: Self-pay | Admitting: Vascular Surgery

## 2024-01-11 ENCOUNTER — Inpatient Hospital Stay: Admission: RE | Admit: 2024-01-11 | Payer: Self-pay | Source: Ambulatory Visit

## 2024-01-11 DIAGNOSIS — I5023 Acute on chronic systolic (congestive) heart failure: Secondary | ICD-10-CM | POA: Diagnosis not present

## 2024-01-11 DIAGNOSIS — M79605 Pain in left leg: Secondary | ICD-10-CM | POA: Diagnosis not present

## 2024-01-11 DIAGNOSIS — I998 Other disorder of circulatory system: Secondary | ICD-10-CM | POA: Diagnosis not present

## 2024-01-11 NOTE — Telephone Encounter (Signed)
 I contacted the patient's daughter by phone after speaking with Dr. Cleotilde.  The current status is anesthesia is not willing to treat the patient as a postop admit.  Given the status of the patient's lower extremity I have recommended the patient be seen in the emergency room and potentially admitted to the hospital at which point we would move forward with our plan for revascularization.  Currently the bed situation at [facility] is critical with approximately 3 dozen people already waiting in the ER with admission orders.  Should that change or his leg worsens significantly than I would proceed to the emergency room.  Patient's daughter agrees with this and I will keep her informed.  In the meantime Dr. Cleotilde will see if there is availability at [facility].  I will keep the patient's daughter informed as events change

## 2024-01-12 ENCOUNTER — Inpatient Hospital Stay: Admission: RE | Admit: 2024-01-12 | Payer: Medicare HMO | Source: Ambulatory Visit

## 2024-01-12 ENCOUNTER — Inpatient Hospital Stay: Admission: RE | Admit: 2024-01-12 | Payer: Self-pay | Source: Ambulatory Visit

## 2024-01-13 DIAGNOSIS — Z85828 Personal history of other malignant neoplasm of skin: Secondary | ICD-10-CM | POA: Diagnosis not present

## 2024-01-13 DIAGNOSIS — D485 Neoplasm of uncertain behavior of skin: Secondary | ICD-10-CM | POA: Diagnosis not present

## 2024-01-13 DIAGNOSIS — C44329 Squamous cell carcinoma of skin of other parts of face: Secondary | ICD-10-CM | POA: Diagnosis not present

## 2024-01-14 ENCOUNTER — Telehealth (HOSPITAL_COMMUNITY): Payer: Self-pay | Admitting: *Deleted

## 2024-01-14 NOTE — Telephone Encounter (Signed)
Spoke with Junious Dresser regarding need for referral as patient is already scheduled for intervention with [facility] on 01/20/2024. Junious Dresser unable to provide helpful details. Stated problems with anesthesia and daughter doesn't want to wait until 01/20/2024. Junious Dresser is to speak with an RN for more details and call back if referral is needed.

## 2024-01-18 ENCOUNTER — Telehealth (INDEPENDENT_AMBULATORY_CARE_PROVIDER_SITE_OTHER): Payer: Self-pay | Admitting: Vascular Surgery

## 2024-01-18 NOTE — Telephone Encounter (Signed)
I spoke with the patient's daughter Mrs. Clovis Riley.  [facility] is out of network so she has been investigating either [facility] or [facility].  I will remove him from the OR schedule and expedite to the best of my ability his follow-up with Dr. Myra Gianotti at [facility].

## 2024-01-19 ENCOUNTER — Telehealth: Payer: Self-pay

## 2024-01-19 NOTE — Telephone Encounter (Signed)
Spoke with pt's daughter and she is able to get pt to the hospital Thursday, as a direct admit. I have provided [facility] bed control nurse with her number.Angelene Giovanni, the nurse at pt's facility is also aware.

## 2024-01-19 NOTE — Telephone Encounter (Signed)
I spoke to [facility] bed control for pt to get direct admit on Thursday for surgery on Friday. They are unable to narrow down a time frame. I also spoke with Travana who is the nurse at pt's facility. She is requesting a time frame for him to get admitted, as they need to line up transportation for him. I have explained to her that the hospital is unable to provide this to [place] at this time. She recently called again and left VM asking for this information.

## 2024-01-20 ENCOUNTER — Inpatient Hospital Stay: Admission: RE | Admit: 2024-01-20 | Payer: HMO | Source: Home / Self Care | Admitting: Vascular Surgery

## 2024-01-20 ENCOUNTER — Encounter: Admission: RE | Payer: Self-pay | Source: Home / Self Care

## 2024-01-20 DIAGNOSIS — Z01812 Encounter for preprocedural laboratory examination: Secondary | ICD-10-CM

## 2024-01-20 HISTORY — DX: Long term (current) use of anticoagulants: Z79.01

## 2024-01-20 SURGERY — ENDARTERECTOMY FEMORAL
Anesthesia: General

## 2024-01-21 ENCOUNTER — Inpatient Hospital Stay (HOSPITAL_COMMUNITY)
Admission: RE | Admit: 2024-01-21 | Discharge: 2024-01-26 | DRG: 253 | Disposition: A | Discharge status: Nursing Home (Includes ICF) | Payer: HMO | Source: Skilled Nursing Facility | Attending: Surgery | Admitting: Surgery

## 2024-01-21 DIAGNOSIS — Z8249 Family history of ischemic heart disease and other diseases of the circulatory system: Secondary | ICD-10-CM

## 2024-01-21 DIAGNOSIS — L89621 Pressure ulcer of left heel, stage 1: Secondary | ICD-10-CM | POA: Diagnosis not present

## 2024-01-21 DIAGNOSIS — E1152 Type 2 diabetes mellitus with diabetic peripheral angiopathy with gangrene: Secondary | ICD-10-CM | POA: Diagnosis not present

## 2024-01-21 DIAGNOSIS — I7025 Atherosclerosis of native arteries of other extremities with ulceration: Secondary | ICD-10-CM

## 2024-01-21 DIAGNOSIS — K219 Gastro-esophageal reflux disease without esophagitis: Secondary | ICD-10-CM | POA: Diagnosis present

## 2024-01-21 DIAGNOSIS — I4891 Unspecified atrial fibrillation: Secondary | ICD-10-CM | POA: Diagnosis present

## 2024-01-21 DIAGNOSIS — Z79899 Other long term (current) drug therapy: Secondary | ICD-10-CM

## 2024-01-21 DIAGNOSIS — E039 Hypothyroidism, unspecified: Secondary | ICD-10-CM | POA: Diagnosis not present

## 2024-01-21 DIAGNOSIS — Z01812 Encounter for preprocedural laboratory examination: Secondary | ICD-10-CM

## 2024-01-21 DIAGNOSIS — E11621 Type 2 diabetes mellitus with foot ulcer: Secondary | ICD-10-CM | POA: Diagnosis not present

## 2024-01-21 DIAGNOSIS — I252 Old myocardial infarction: Secondary | ICD-10-CM

## 2024-01-21 DIAGNOSIS — I70262 Atherosclerosis of native arteries of extremities with gangrene, left leg: Secondary | ICD-10-CM | POA: Diagnosis not present

## 2024-01-21 DIAGNOSIS — I2510 Atherosclerotic heart disease of native coronary artery without angina pectoris: Secondary | ICD-10-CM | POA: Diagnosis not present

## 2024-01-21 DIAGNOSIS — I2729 Other secondary pulmonary hypertension: Secondary | ICD-10-CM | POA: Diagnosis not present

## 2024-01-21 DIAGNOSIS — I5022 Chronic systolic (congestive) heart failure: Secondary | ICD-10-CM | POA: Diagnosis not present

## 2024-01-21 DIAGNOSIS — I110 Hypertensive heart disease with heart failure: Secondary | ICD-10-CM | POA: Diagnosis not present

## 2024-01-21 DIAGNOSIS — Z79890 Hormone replacement therapy (postmenopausal): Secondary | ICD-10-CM

## 2024-01-21 DIAGNOSIS — Z823 Family history of stroke: Secondary | ICD-10-CM | POA: Diagnosis not present

## 2024-01-21 DIAGNOSIS — I70222 Atherosclerosis of native arteries of extremities with rest pain, left leg: Secondary | ICD-10-CM | POA: Diagnosis present

## 2024-01-21 DIAGNOSIS — E785 Hyperlipidemia, unspecified: Secondary | ICD-10-CM | POA: Diagnosis present

## 2024-01-21 DIAGNOSIS — D62 Acute posthemorrhagic anemia: Secondary | ICD-10-CM | POA: Diagnosis present

## 2024-01-21 DIAGNOSIS — E119 Type 2 diabetes mellitus without complications: Secondary | ICD-10-CM | POA: Diagnosis not present

## 2024-01-21 DIAGNOSIS — L97429 Non-pressure chronic ulcer of left heel and midfoot with unspecified severity: Secondary | ICD-10-CM | POA: Diagnosis not present

## 2024-01-21 DIAGNOSIS — Z7901 Long term (current) use of anticoagulants: Secondary | ICD-10-CM

## 2024-01-21 DIAGNOSIS — I4892 Unspecified atrial flutter: Secondary | ICD-10-CM | POA: Diagnosis present

## 2024-01-21 DIAGNOSIS — I70219 Atherosclerosis of native arteries of extremities with intermittent claudication, unspecified extremity: Secondary | ICD-10-CM | POA: Diagnosis not present

## 2024-01-21 LAB — CBC WITH DIFFERENTIAL/PLATELET
Abs Immature Granulocytes: 0.04 10*3/uL (ref 0.00–0.07)
Basophils Absolute: 0.1 10*3/uL (ref 0.0–0.1)
Basophils Relative: 1 %
Eosinophils Absolute: 0.3 10*3/uL (ref 0.0–0.5)
Eosinophils Relative: 4 %
HCT: 26.2 % — ABNORMAL LOW (ref 39.0–52.0)
Hemoglobin: 8.6 g/dL — ABNORMAL LOW (ref 13.0–17.0)
Immature Granulocytes: 0 %
Lymphocytes Relative: 16 %
Lymphs Abs: 1.5 10*3/uL (ref 0.7–4.0)
MCH: 33.6 pg (ref 26.0–34.0)
MCHC: 32.8 g/dL (ref 30.0–36.0)
MCV: 102.3 fL — ABNORMAL HIGH (ref 80.0–100.0)
Monocytes Absolute: 1.4 10*3/uL — ABNORMAL HIGH (ref 0.1–1.0)
Monocytes Relative: 15 %
Neutro Abs: 6.2 10*3/uL (ref 1.7–7.7)
Neutrophils Relative %: 64 %
Platelets: 179 10*3/uL (ref 150–400)
RBC: 2.56 MIL/uL — ABNORMAL LOW (ref 4.22–5.81)
RDW: 20.9 % — ABNORMAL HIGH (ref 11.5–15.5)
WBC: 9.7 10*3/uL (ref 4.0–10.5)
nRBC: 0 % (ref 0.0–0.2)

## 2024-01-21 LAB — BASIC METABOLIC PANEL
Anion gap: 6 (ref 5–15)
BUN: 25 mg/dL — ABNORMAL HIGH (ref 8–23)
CO2: 27 mmol/L (ref 22–32)
Calcium: 8.7 mg/dL — ABNORMAL LOW (ref 8.9–10.3)
Chloride: 106 mmol/L (ref 98–111)
Creatinine, Ser: 1.25 mg/dL — ABNORMAL HIGH (ref 0.61–1.24)
GFR, Estimated: 54 mL/min — ABNORMAL LOW (ref >60)
Glucose, Bld: 144 mg/dL — ABNORMAL HIGH (ref 70–99)
Potassium: 3.9 mmol/L (ref 3.5–5.1)
Sodium: 139 mmol/L (ref 135–145)

## 2024-01-21 MED ORDER — METOPROLOL SUCCINATE ER 25 MG PO TB24
25.0000 mg | ORAL_TABLET | Freq: Every day | ORAL | Status: AC
Start: 2024-01-21 — End: ?
  Administered 2024-01-21 – 2024-01-26 (×6): 25 mg via ORAL
  Filled 2024-01-21 (×6): qty 1

## 2024-01-21 MED ORDER — CHLORHEXIDINE GLUCONATE CLOTH 2 % EX PADS
6.0000 | MEDICATED_PAD | Freq: Once | CUTANEOUS | Status: AC
  Administered 2024-01-22: 6 via TOPICAL

## 2024-01-21 MED ORDER — METOPROLOL TARTRATE 5 MG/5ML IV SOLN: 2.0000 mg | INTRAVENOUS | Status: DC | PRN

## 2024-01-21 MED ORDER — PANTOPRAZOLE SODIUM 40 MG PO TBEC
40.0000 mg | DELAYED_RELEASE_TABLET | Freq: Every day | ORAL | Status: DC
  Administered 2024-01-21 – 2024-01-26 (×5): 40 mg via ORAL
  Filled 2024-01-21 (×5): qty 1

## 2024-01-21 MED ORDER — VANCOMYCIN HCL IN DEXTROSE 1-5 GM/200ML-% IV SOLN
1000.0000 mg | INTRAVENOUS | Status: AC
  Administered 2024-01-22: 1000 mg via INTRAVENOUS
  Filled 2024-01-21: qty 200

## 2024-01-21 MED ORDER — ONDANSETRON HCL 4 MG/2ML IJ SOLN: 4.0000 mg | Freq: Four times a day (QID) | INTRAMUSCULAR | Status: DC | PRN

## 2024-01-21 MED ORDER — POTASSIUM CHLORIDE CRYS ER 20 MEQ PO TBCR
20.0000 meq | EXTENDED_RELEASE_TABLET | Freq: Once | ORAL | Status: AC
  Administered 2024-01-21: 20 meq via ORAL
  Filled 2024-01-21: qty 1

## 2024-01-21 MED ORDER — SIMVASTATIN 20 MG PO TABS
40.0000 mg | ORAL_TABLET | Freq: Every day | ORAL | Status: DC
Start: 2024-01-21 — End: 2024-01-26
  Administered 2024-01-21 – 2024-01-26 (×5): 40 mg via ORAL
  Filled 2024-01-21 (×5): qty 2

## 2024-01-21 MED ORDER — ALUM & MAG HYDROXIDE-SIMETH 200-200-20 MG/5ML PO SUSP: 15.0000 mL | ORAL | Status: DC | PRN

## 2024-01-21 MED ORDER — HYDRALAZINE HCL 20 MG/ML IJ SOLN: 5.0000 mg | INTRAMUSCULAR | Status: DC | PRN

## 2024-01-21 MED ORDER — LABETALOL HCL 5 MG/ML IV SOLN: 10.0000 mg | INTRAVENOUS | Status: DC | PRN

## 2024-01-21 MED ORDER — GUAIFENESIN-DM 100-10 MG/5ML PO SYRP: 15.0000 mL | ORAL_SOLUTION | ORAL | Status: DC | PRN

## 2024-01-21 MED ORDER — CHLORHEXIDINE GLUCONATE CLOTH 2 % EX PADS
6.0000 | MEDICATED_PAD | Freq: Once | CUTANEOUS | Status: AC
  Administered 2024-01-21: 6 via TOPICAL

## 2024-01-21 MED ORDER — PHENOL 1.4 % MT LIQD: 1.0000 | OROMUCOSAL | Status: DC | PRN

## 2024-01-21 NOTE — Progress Notes (Signed)
Spoke with Chestine Spore, MD about missing orders.  He states a PA will address the issue "when she is out of the OR."

## 2024-01-21 NOTE — Anesthesia Preprocedure Evaluation (Addendum)
Anesthesia Evaluation  Patient identified by MRN, date of birth, ID band Patient awake    Reviewed: Allergy & Precautions, NPO status , Patient's Chart, lab work & pertinent test results  History of Anesthesia Complications Negative for: history of anesthetic complications  Airway Mallampati: III  TM Distance: >3 FB Neck ROM: Full    Dental  (+) Teeth Intact, Dental Advisory Given,    Pulmonary neg sleep apnea, neg COPD, neg recent URI   breath sounds clear to auscultation       Cardiovascular hypertension, Pt. on medications and Pt. on home beta blockers + CAD, + Past MI, + CABG, + Peripheral Vascular Disease and +CHF  + dysrhythmias Atrial Fibrillation  Rhythm:Regular  Echo:  1. Left ventricular ejection fraction, by estimation, is 35 to 40%. The  left ventricle has moderately decreased function. The left ventricle  demonstrates regional wall motion abnormalities (see scoring  diagram/findings for description). Left ventricular   diastolic parameters are consistent with Grade II diastolic dysfunction  (pseudonormalization).   2. Right ventricular systolic function is mildly reduced. The right  ventricular size is moderately enlarged. There is severely elevated  pulmonary artery systolic pressure.   3. Left atrial size was severely dilated.   4. Right atrial size was severely dilated.   5. The mitral valve is normal in structure. Mild mitral valve  regurgitation. No evidence of mitral stenosis.   6. The aortic valve is tricuspid. There is mild calcification of the  aortic valve. There is mild thickening of the aortic valve. Aortic valve  regurgitation is not visualized. No aortic stenosis is present.   7. The inferior vena cava is dilated in size with <50% respiratory  variability, suggesting right atrial pressure of 15 mmHg.     Neuro/Psych neg Seizures negative neurological ROS  negative psych ROS   GI/Hepatic Neg  liver ROS,GERD  ,,  Endo/Other  diabetesHypothyroidism    Renal/GU negative Renal ROS     Musculoskeletal   Abdominal   Peds  Hematology  (+) Blood dyscrasia, anemia Lab Results      Component                Value               Date                      WBC                      9.6                 01/22/2024                HGB                      9.2 (L)             01/22/2024                HCT                      27.9 (L)            01/22/2024                MCV                      100.7 (H)  01/22/2024                PLT                      194                 01/22/2024           Lab Results      Component                Value               Date                      INR                      1.1                 02/20/2023               Eliquis last dose 12/20   Anesthesia Other Findings   Reproductive/Obstetrics                              Anesthesia Physical Anesthesia Plan  ASA: 3  Anesthesia Plan: MAC and Spinal   Post-op Pain Management: Minimal or no pain anticipated   Induction:   PONV Risk Score and Plan: 1  Airway Management Planned: Nasal Cannula, Natural Airway and Simple Face Mask  Additional Equipment: Arterial line  Intra-op Plan:   Post-operative Plan:   Informed Consent: I have reviewed the patients History and Physical, chart, labs and discussed the procedure including the risks, benefits and alternatives for the proposed anesthesia with the patient or authorized representative who has indicated his/her understanding and acceptance.     Dental advisory given  Plan Discussed with: CRNA  Anesthesia Plan Comments: (Surgeon asks [place] to consider neuraxial due to patient's frailty. )        Anesthesia Quick Evaluation

## 2024-01-21 NOTE — H&P (Addendum)
H&P   History of Present Illness: [name] [name] is a 89 y.o. male with a PMH of a-fib, CHF, CAD, HTN, pulmonary HTN, PAD, and DMII who is being directly admitted to [facility] on 01/21/2024 for vascular intervention. He is typically under the care of [facility].   He has a history of rest pain in the left lower extremity requiring tibioperoneal trunk angioplasty and SFA-pop stenting on 08/30/2021 by Dr.Schnier. During a recent hospital admission in Sept 2024, he was found to have poorly healing wounds to the dorsum of his left foot and left lateral malleolus. Angiography performed on 11/24 demonstrated a nearly occluded left common femoral and heavily diseased SFA and popliteal arteries. He was planned to have open revascularization at [facility], however anesthesia recommended intervention at a tertiary care center given his cardiopulmonary disease.   At the beside today the patient has no complaints.  He states his wounds have been present since his hospitalization in September.  He has been receiving 2 times weekly wound care by home health nursing.  Both the patient and his family feel like his wounds have fluctuated in appearance.  He endorses serous drainage from the wounds.  He denies any definitive erythema around the wounds or tenderness.  He states sometimes at night when laying down his foot will hurt.  Family says the patient has not been walking for several months due to weakness.  He has held his Eliquis for his surgery.  Past Medical History:  Diagnosis Date   A-fib [facility])    a.) CHA2DS2VASc = 5 (age x 2, CHF, HTN, previous MI/vascular disease history);  b.) s/p DCCV 03/05/1993; c.) rate/rhythm maintained on oral metoprolol succinate; chronically anticoagulated with dose reduced apixaban   Aortic atherosclerosis (HCC)    B12 deficiency    CHF (congestive heart failure) (HCC)    a.) TTE 11/05/12: EF 25-35, dist sep AK, mild MR; b.) TTE 05/10/14: EF 30, mild LVH, mod BAE, mild AR/PR,  mod MR/TR; c.) TTE 03/12/20: EF 20, sev glob HK, mod BAE, mild LAE, mod RAE, triv PR, mild AR, sev MR/TR; d.) TTE 08/21/21: EF 30, sev glob HK, apical dyskinesis, mild LVH, mod BAE, triv AR/PR, mod MR, sev TR; e.) TTE 09/25/23: EF 35-40,  diff AK/HK, G2DD, sev BAE, RVE, RVSP 66.3, mild MR, AoV calc   Cor pulmonale (HCC)    Coronary artery disease 02/21/1993   a.) LHC 02/21/1993 : 95% pLAD, 75% OM1, 50% OM3, 25-50% pRCA, 50% large anterolateral lesion --> transfer to [facility] for CABG; b.) s/p 2v CABG 02/26/1993 (LIMA-LAD, SVG-OM1)   Family history of adverse reaction to anesthesia    a.) PONV in 1st degree relative (daughter)   GERD (gastroesophageal reflux disease)    History of [place] spotted fever    HLD (hyperlipidemia)    HTN (hypertension), benign 04/25/2014   Hypothyroidism    Iron deficiency anemia    On apixaban therapy    Posterolateral myocardial infarction (HCC) 02/21/1993   LHC 02/21/1993 : 95% pLAD, 75% OM1, 50% OM3, 25-50% pRCA, 50% large anterolateral lesion --> transfer to [facility] for CABG; b.) s/p 2v CABG 02/26/1993 (LIMA-LAD, SVG-OM1)   Pulmonary HTN (HCC)    a.) TTE 05/10/2014: RVSP 48.6; b.) TTE 03/12/2020: RVSP 73.3; c.) TTE  09/25/2023: RVSP 66.3   PVD (peripheral vascular disease) (HCC)    S/P CABG x 2 02/26/1993   a.) LIMA-LAD, SVG-OM1 --> complicated by atrial flutter on POD4 --> Tx'd with digoxin + procainamide with no  resolution. POD5 diltiazem gtt added with no improvement. Ultimately required DCCV on 03/05/1993.   Squamous cell carcinoma of skin 11/18/2021   right ear and mandible, EDC   Squamous cell carcinoma of skin 03/18/2022   right mandible and ear/refer for Mohs/ Dr. Adriana Simas has referred pt to Dr. Nedra Hai in otolaryngology excised by Dr. Nedra Hai 05/03/22, Radiation with Dr. Rushie Chestnut   Type 2 diabetes mellitus with peripheral angiopathy (HCC) 11/10/2018    Past Surgical History:  Procedure Laterality Date   cancer removal   2023   below the right base of ear at [facility]    COLONOSCOPY     CORONARY ARTERY BYPASS GRAFT N/A 02/26/1993   Procedure: CORONARY ARTERY BYPASS GRAFT; Location: [facility]; Surgeon: Marshell Garfinkel, MD   HERNIA REPAIR     LOWER EXTREMITY ANGIOGRAPHY Left 08/30/2021   Procedure: LOWER EXTREMITY ANGIOGRAPHY;  Surgeon: Renford Dills, MD;  Location: [facility] INVASIVE CV LAB;  Service: Cardiovascular;  Laterality: Left;   LOWER EXTREMITY ANGIOGRAPHY Left 11/10/2023   Procedure: Lower Extremity Angiography;  Surgeon: Renford Dills, MD;  Location: [facility] INVASIVE CV LAB;  Service: Cardiovascular;  Laterality: Left;    Allergies  Allergen Reactions   Cephalexin Rash   Spironolactone Nausea Only   Codeine Rash and Other (See Comments)    Can not sleep   Doxycycline Rash    Prior to Admission medications   Medication Sig Start Date End Date Taking? Authorizing Provider  acetaminophen (TYLENOL) 325 MG tablet Take 650 mg by mouth in the morning and at bedtime.   Yes [provider]  apixaban (ELIQUIS) 2.5 MG TABS tablet Take 2.5 mg by mouth in the morning.   Yes [provider]  cyanocobalamin (VITAMIN B12) 1000 MCG tablet Take 1,000 mcg by mouth in the morning.   Yes [provider]  ferrous sulfate 325 (65 FE) MG EC tablet Take 325 mg by mouth 2 (two) times a week. Tuesday and Thursday in the morning   Yes [provider]  levothyroxine (SYNTHROID, LEVOTHROID) 112 MCG tablet Take 112 mcg by mouth daily. 03/28/14  Yes [provider]  metoprolol succinate (TOPROL-XL) 25 MG 24 hr tablet Take 25 mg by mouth daily. 10/21/23  Yes [provider]  simvastatin (ZOCOR) 40 MG tablet Take 40 mg by mouth daily. 03/28/14  Yes [provider]  torsemide (DEMADEX) 10 MG tablet Take 10 mg by mouth in the morning.   Yes [provider]    Social History   Socioeconomic History   Marital status: Widowed    Spouse name: Not on file   Number of children: 2   Years of education: Not on file    Highest education level: Not on file  Occupational History   Not on file  Tobacco Use   Smoking status: Never   Smokeless tobacco: Never  Vaping Use   Vaping status: Never Used  Substance and Sexual Activity   Alcohol use: No   Drug use: No   Sexual activity: Not on file  Other Topics Concern   Not on file  Social History Narrative   Lives by himself: daughter penny lives in [place]. Daughter [name] lives in town   Social Drivers of Health   Financial Resource Strain: Low Risk  (09/22/2023)   Received from [facility]   Overall Financial Resource Strain (CARDIA)    Difficulty of Paying Living Expenses: Not hard at all  Food Insecurity: No Food Insecurity (09/24/2023)   Hunger Vital  Sign    Worried About Programme researcher, broadcasting/film/video in the Last Year: Never true    Ran Out of Food in the Last Year: Never true  Transportation Needs: No Transportation Needs (09/24/2023)   PRAPARE - Administrator, Civil Service (Medical): No    Lack of Transportation (Non-Medical): No  Physical Activity: Not on file  Stress: Not on file  Social Connections: Moderately Isolated (04/24/2022)   [facility]   Social Connection and Isolation Panel [NHANES]    Frequency of Communication with Friends and Family: More than three times a week    Frequency of Social Gatherings with Friends and Family: Twice a week    Attends Religious Services: More than 4 times per year    Active Member of [employer] or Organizations: No    Attends Banker Meetings: Never    Marital Status: Widowed  Intimate Partner Violence: Not At Risk (09/24/2023)   Humiliation, Afraid, Rape, and Kick questionnaire    Fear of Current or Ex-Partner: No    Emotionally Abused: No    Physically Abused: No    Sexually Abused: No     Family History  Problem Relation Age of Onset   Hypertension Mother    Diabetes Brother    Stroke Brother     Heart attack Brother     ROS: Otherwise negative unless mentioned in HPI  Physical Examination  There were no vitals filed for this visit. There is no height or weight on file to calculate BMI.  General:  WDWN in NAD Gait: Not observed HENT: WNL, normocephalic Pulmonary: normal non-labored breathing Cardiac: irregularly irregular Abdomen:  soft, NT/ND, no masses Skin: without rashes Vascular Exam/Pulses: nonpalpable left pedal pulses. Intact left PT and peroneal doppler signals Extremities: several wounds to the left foot including dorsum, anterior ankle, and lateral malleolus. There also is a wound on the left heel with overlying dry gangrene Musculoskeletal: no muscle wasting or atrophy  Neurologic: A&O X 3;  No focal weakness or paresthesias are detected; speech is fluent/normal Psychiatric:  The pt has Normal affect. Lymph:  Unremarkable         CBC    Component Value Date/Time   WBC 8.2 11/28/2023 1422   RBC 2.32 (L) 11/28/2023 1422   HGB 8.4 (L) 11/28/2023 1422   HGB 11.8 (L) 02/04/2015 1001   HCT 25.0 (L) 11/28/2023 1422   HCT 35.7 (L) 02/04/2015 1001   PLT 177 11/28/2023 1422   PLT 167 02/04/2015 1001   MCV 107.8 (H) 11/28/2023 1422   MCV 108 (H) 02/04/2015 1001   MCH 36.2 (H) 11/28/2023 1422   MCHC 33.6 11/28/2023 1422   RDW 21.2 (H) 11/28/2023 1422   RDW 20.0 (H) 02/04/2015 1001   LYMPHSABS 2.1 11/28/2023 1422   MONOABS 1.0 11/28/2023 1422   EOSABS 0.2 11/28/2023 1422   BASOSABS 0.1 11/28/2023 1422    BMET    Component Value Date/Time   NA 134 (L) 11/28/2023 1422   NA 141 02/04/2015 1001   K 4.0 11/28/2023 1422   K 4.2 02/04/2015 1001   CL 101 11/28/2023 1422   CL 106 02/04/2015 1001   CO2 23 11/28/2023 1422   CO2 30 02/04/2015 1001   GLUCOSE 181 (H) 11/28/2023 1422   GLUCOSE 122 (H) 02/04/2015 1001   BUN 24 (H) 11/28/2023 1422   BUN 19 (H) 02/04/2015 1001   CREATININE 1.01 11/28/2023 1422   CREATININE  1.26 02/04/2015 1001   CALCIUM 8.5  (L) 11/28/2023 1422   CALCIUM 8.5 02/04/2015 1001   GFRNONAA >60 11/28/2023 1422   GFRNONAA 58 (L) 02/04/2015 1001   GFRNONAA >60 11/06/2013 1304   GFRAA >60 02/05/2019 0121   GFRAA >60 02/04/2015 1001   GFRAA >60 11/06/2013 1304    COAGS: Lab Results  Component Value Date   INR 1.1 02/20/2023   INR 2.0 (H) 08/25/2021   INR 2.99 02/05/2019    Statin:  Yes.   Beta Blocker:  Yes.   Aspirin:  No. ACEI:  No. ARB:  No. CCB use:  No Other antiplatelets/anticoagulants:  Yes.   Apixaban   ASSESSMENT/PLAN: This is a 89 y.o. male being admitted for vascular intervention on 01/22/2024   -The patient has a prior history of endovascular interventions on the left lower extremity including tibioperoneal trunk angioplasty and SFA pop stenting.  This was done in 2022 for rest pain -During a recent hospitalization in September the patient was found to have several poorly healing wounds to the left foot.  His wounds have not improved in appearance over time.  There are wounds on the dorsum of the foot, lateral malleolus, anterior ankle, and heel.  There is dry gangrene of the heel.  He has been receiving wound care at home 2 times weekly by home health nursing. -His most recent angiogram of the left lower extremity was on 11/10/2023.  This demonstrated severe left common femoral disease with significant SFA and popliteal disease.  His previous SFA pop stent is patent -On exam he has nonpalpable pedal pulses on the left.  He does have intact PT and peroneal Doppler signals.  Given his current blood flow, he likely will not be able to heal these wounds on his own. -He will be going to the OR tomorrow for revascularization with Dr. Myra Gianotti.  This will include left femoral endarterectomy with popliteal artery stenting.  The patient is agreeable to surgery.  I have placed consent orders.  He will be n.p.o. at midnight   Loel Dubonnet PA-C [facility] [phone]   I agree with the  above.  I have seen and evaluated the patient.  His family is at the bedside.  Briefly this is a 89 year old gentleman with a chronic left foot and heel wound.  He has undergone angiography at [facility].  He was scheduled for left femoral endarterectomy and popliteal intervention, however anesthesia at [place] was unwilling to put him to sleep.  I spoke with the daughter several nights ago and felt the best way to expedite his care is to bring him into the hospital for admission and consultation with plans for left femoral endarterectomy and popliteal intervention tomorrow.  He has been off of his anticoagulation since Monday.  I discussed that with his age, he is obviously at risk for surgical complications.  We discussed the possibility that even with successful revascularization he may have issues healing his wound and could ultimately end up with an amputation.  All details of the surgery and his care were discussed with the family and the patient and they wish to proceed.  I spoke with anesthesia about the possibility of spinal anesthesia instead of general.  Durene Cal

## 2024-01-21 NOTE — Progress Notes (Signed)
Pt direct admit, brought in by family.  Pleasant, alert, and oriented, able to transfer from [facility] to bed with minimal assist.  CHG performed, monitors applied, CCMD notified. No admit orders yet, will follow up.

## 2024-01-22 ENCOUNTER — Other Ambulatory Visit: Payer: Self-pay

## 2024-01-22 ENCOUNTER — Inpatient Hospital Stay (HOSPITAL_COMMUNITY): Payer: HMO | Admitting: Anesthesiology

## 2024-01-22 ENCOUNTER — Encounter (HOSPITAL_COMMUNITY)
Admission: RE | Disposition: A | Discharge status: Nursing Home (Includes ICF) | Payer: Self-pay | Source: Skilled Nursing Facility | Attending: Surgery

## 2024-01-22 ENCOUNTER — Encounter (HOSPITAL_COMMUNITY): Payer: Self-pay | Admitting: Surgery

## 2024-01-22 ENCOUNTER — Inpatient Hospital Stay (HOSPITAL_COMMUNITY): Payer: HMO

## 2024-01-22 DIAGNOSIS — I2510 Atherosclerotic heart disease of native coronary artery without angina pectoris: Secondary | ICD-10-CM | POA: Diagnosis not present

## 2024-01-22 DIAGNOSIS — E119 Type 2 diabetes mellitus without complications: Secondary | ICD-10-CM

## 2024-01-22 DIAGNOSIS — I70262 Atherosclerosis of native arteries of extremities with gangrene, left leg: Secondary | ICD-10-CM | POA: Diagnosis not present

## 2024-01-22 DIAGNOSIS — I70219 Atherosclerosis of native arteries of extremities with intermittent claudication, unspecified extremity: Secondary | ICD-10-CM | POA: Diagnosis not present

## 2024-01-22 HISTORY — PX: ENDARTERECTOMY FEMORAL: SHX5804

## 2024-01-22 HISTORY — PX: FEMORAL-FEMORAL BYPASS GRAFT: SHX936

## 2024-01-22 HISTORY — PX: ANGIOPLASTY: SHX39

## 2024-01-22 LAB — CBC
HCT: 27.9 % — ABNORMAL LOW (ref 39.0–52.0)
Hemoglobin: 9.2 g/dL — ABNORMAL LOW (ref 13.0–17.0)
MCH: 33.2 pg (ref 26.0–34.0)
MCHC: 33 g/dL (ref 30.0–36.0)
MCV: 100.7 fL — ABNORMAL HIGH (ref 80.0–100.0)
Platelets: 194 10*3/uL (ref 150–400)
RBC: 2.77 MIL/uL — ABNORMAL LOW (ref 4.22–5.81)
RDW: 20.8 % — ABNORMAL HIGH (ref 11.5–15.5)
WBC: 9.6 10*3/uL (ref 4.0–10.5)
nRBC: 0 % (ref 0.0–0.2)

## 2024-01-22 LAB — GLUCOSE, CAPILLARY
Glucose-Capillary: 101 mg/dL — ABNORMAL HIGH (ref 70–99)
Glucose-Capillary: 102 mg/dL — ABNORMAL HIGH (ref 70–99)
Glucose-Capillary: 127 mg/dL — ABNORMAL HIGH (ref 70–99)

## 2024-01-22 LAB — COMPREHENSIVE METABOLIC PANEL
ALT: 17 U/L (ref 0–44)
AST: 37 U/L (ref 15–41)
Albumin: 3.3 g/dL — ABNORMAL LOW (ref 3.5–5.0)
Alkaline Phosphatase: 71 U/L (ref 38–126)
Anion gap: 12 (ref 5–15)
BUN: 23 mg/dL (ref 8–23)
CO2: 24 mmol/L (ref 22–32)
Calcium: 9.1 mg/dL (ref 8.9–10.3)
Chloride: 103 mmol/L (ref 98–111)
Creatinine, Ser: 1.08 mg/dL (ref 0.61–1.24)
GFR, Estimated: >60 mL/min (ref >60)
Glucose, Bld: 111 mg/dL — ABNORMAL HIGH (ref 70–99)
Potassium: 4.1 mmol/L (ref 3.5–5.1)
Sodium: 139 mmol/L (ref 135–145)
Total Bilirubin: 0.7 mg/dL (ref 0.0–1.2)
Total Protein: 6.9 g/dL (ref 6.5–8.1)

## 2024-01-22 LAB — PREPARE RBC (CROSSMATCH)

## 2024-01-22 LAB — SURGICAL PCR SCREEN
MRSA, PCR: NEGATIVE
Staphylococcus aureus: NEGATIVE

## 2024-01-22 LAB — ABO/RH: ABO/RH(D): O NEG

## 2024-01-22 SURGERY — ENDARTERECTOMY, FEMORAL
Anesthesia: Monitor Anesthesia Care | Site: Leg Upper | Laterality: Left

## 2024-01-22 MED ORDER — ONDANSETRON HCL 4 MG/2ML IJ SOLN
INTRAMUSCULAR | Status: AC
  Filled 2024-01-22: qty 2

## 2024-01-22 MED ORDER — PROPOFOL 10 MG/ML IV BOLUS
INTRAVENOUS | Status: AC
  Filled 2024-01-22: qty 20

## 2024-01-22 MED ORDER — SODIUM CHLORIDE 0.9 % IV SOLN: 500.0000 mL | Freq: Once | INTRAVENOUS | Status: DC | PRN

## 2024-01-22 MED ORDER — PROTAMINE SULFATE 10 MG/ML IV SOLN
INTRAVENOUS | Status: DC | PRN
  Administered 2024-01-22: 10 mg via INTRAVENOUS
  Administered 2024-01-22 (×2): 5 mg via INTRAVENOUS
  Administered 2024-01-22 (×3): 10 mg via INTRAVENOUS

## 2024-01-22 MED ORDER — METOPROLOL TARTRATE 5 MG/5ML IV SOLN: 2.0000 mg | INTRAVENOUS | Status: DC | PRN

## 2024-01-22 MED ORDER — PHENYLEPHRINE HCL-NACL 20-0.9 MG/250ML-% IV SOLN
INTRAVENOUS | Status: DC | PRN
  Administered 2024-01-22: 35 ug/min via INTRAVENOUS

## 2024-01-22 MED ORDER — MILRINONE LACTATE IN DEXTROSE 20-5 MG/100ML-% IV SOLN
INTRAVENOUS | Status: DC | PRN
  Administered 2024-01-22: .375 ug/kg/min via INTRAVENOUS

## 2024-01-22 MED ORDER — SURGIFLO WITH THROMBIN (HEMOSTATIC MATRIX KIT) OPTIME
TOPICAL | Status: DC | PRN
  Administered 2024-01-22: 1 via TOPICAL

## 2024-01-22 MED ORDER — ACETAMINOPHEN 325 MG PO TABS
325.0000 mg | ORAL_TABLET | ORAL | Status: DC | PRN
  Administered 2024-01-23 – 2024-01-25 (×4): 650 mg via ORAL
  Filled 2024-01-22 (×3): qty 2

## 2024-01-22 MED ORDER — PROPOFOL 500 MG/50ML IV EMUL
INTRAVENOUS | Status: DC | PRN
  Administered 2024-01-22: 25 ug/kg/min via INTRAVENOUS

## 2024-01-22 MED ORDER — FENTANYL CITRATE (PF) 100 MCG/2ML IJ SOLN
INTRAMUSCULAR | Status: AC
  Administered 2024-01-22: 25 ug via INTRAVENOUS
  Filled 2024-01-22: qty 2

## 2024-01-22 MED ORDER — PROTAMINE SULFATE 10 MG/ML IV SOLN
INTRAVENOUS | Status: AC
  Filled 2024-01-22: qty 5

## 2024-01-22 MED ORDER — HEPARIN 6000 UNIT IRRIGATION SOLUTION
Status: AC
  Filled 2024-01-22: qty 500

## 2024-01-22 MED ORDER — DOCUSATE SODIUM 100 MG PO CAPS
100.0000 mg | ORAL_CAPSULE | Freq: Every day | ORAL | Status: DC
Start: 2024-01-23 — End: 2024-01-26
  Administered 2024-01-23 – 2024-01-26 (×3): 100 mg via ORAL
  Filled 2024-01-22 (×4): qty 1

## 2024-01-22 MED ORDER — LEVOTHYROXINE SODIUM 112 MCG PO TABS
112.0000 ug | ORAL_TABLET | Freq: Every day | ORAL | Status: DC
  Administered 2024-01-23 – 2024-01-26 (×4): 112 ug via ORAL
  Filled 2024-01-22 (×4): qty 1

## 2024-01-22 MED ORDER — HEPARIN SODIUM (PORCINE) 1000 UNIT/ML IJ SOLN
INTRAMUSCULAR | Status: AC
  Filled 2024-01-22: qty 20

## 2024-01-22 MED ORDER — LACTATED RINGERS IV SOLN: INTRAVENOUS | Status: DC | PRN

## 2024-01-22 MED ORDER — IODIXANOL 320 MG/ML IV SOLN
INTRAVENOUS | Status: DC | PRN
  Administered 2024-01-22: 77 mL via INTRA_ARTERIAL

## 2024-01-22 MED ORDER — ACETAMINOPHEN 650 MG RE SUPP: 325.0000 mg | RECTAL | Status: DC | PRN

## 2024-01-22 MED ORDER — POTASSIUM CHLORIDE CRYS ER 20 MEQ PO TBCR: 20.0000 meq | EXTENDED_RELEASE_TABLET | Freq: Every day | ORAL | Status: DC | PRN

## 2024-01-22 MED ORDER — CHLORHEXIDINE GLUCONATE 0.12 % MT SOLN: 15.0000 mL | Freq: Once | OROMUCOSAL | Status: AC

## 2024-01-22 MED ORDER — FENTANYL CITRATE (PF) 100 MCG/2ML IJ SOLN: 25.0000 ug | Freq: Once | INTRAMUSCULAR | Status: AC

## 2024-01-22 MED ORDER — ONDANSETRON HCL 4 MG/2ML IJ SOLN
4.0000 mg | Freq: Once | INTRAMUSCULAR | Status: DC | PRN
Start: 2024-01-22 — End: 2024-01-22

## 2024-01-22 MED ORDER — HEPARIN SODIUM (PORCINE) 1000 UNIT/ML IJ SOLN
INTRAMUSCULAR | Status: DC | PRN
  Administered 2024-01-22: 4000 [IU] via INTRAVENOUS
  Administered 2024-01-22: 2000 [IU] via INTRAVENOUS
  Administered 2024-01-22: 6000 [IU] via INTRAVENOUS

## 2024-01-22 MED ORDER — 0.9 % SODIUM CHLORIDE (POUR BTL) OPTIME
TOPICAL | Status: DC | PRN
  Administered 2024-01-22: 2000 mL

## 2024-01-22 MED ORDER — DEXMEDETOMIDINE HCL IN NACL 400 MCG/100ML IV SOLN
INTRAVENOUS | Status: DC | PRN
  Administered 2024-01-22: 1 ug/kg/h via INTRAVENOUS

## 2024-01-22 MED ORDER — HEPARIN 6000 UNIT IRRIGATION SOLUTION
Status: DC | PRN
  Administered 2024-01-22: 1

## 2024-01-22 MED ORDER — OXYCODONE-ACETAMINOPHEN 5-325 MG PO TABS: 1.0000 | ORAL_TABLET | ORAL | Status: DC | PRN

## 2024-01-22 MED ORDER — MAGNESIUM SULFATE 2 GM/50ML IV SOLN: 2.0000 g | Freq: Every day | INTRAVENOUS | Status: DC | PRN

## 2024-01-22 MED ORDER — BUPIVACAINE HCL (PF) 0.5 % IJ SOLN
INTRAMUSCULAR | Status: DC | PRN
  Administered 2024-01-22: 3 mL via INTRATHECAL

## 2024-01-22 MED ORDER — FENTANYL CITRATE (PF) 100 MCG/2ML IJ SOLN: 25.0000 ug | INTRAMUSCULAR | Status: DC | PRN

## 2024-01-22 MED ORDER — SODIUM CHLORIDE 0.9 % IV SOLN: 10.0000 mL/h | Freq: Once | INTRAVENOUS | Status: DC

## 2024-01-22 MED ORDER — MORPHINE SULFATE (PF) 2 MG/ML IV SOLN: 2.0000 mg | INTRAVENOUS | Status: DC | PRN

## 2024-01-22 MED ORDER — ALBUMIN HUMAN 5 % IV SOLN: INTRAVENOUS | Status: DC | PRN

## 2024-01-22 MED ORDER — HEPARIN SODIUM (PORCINE) 5000 UNIT/ML IJ SOLN
5000.0000 [IU] | Freq: Three times a day (TID) | INTRAMUSCULAR | Status: DC
  Administered 2024-01-23 – 2024-01-26 (×10): 5000 [IU] via SUBCUTANEOUS
  Filled 2024-01-22 (×10): qty 1

## 2024-01-22 MED ORDER — ORAL CARE MOUTH RINSE: 15.0000 mL | Freq: Once | OROMUCOSAL | Status: AC

## 2024-01-22 MED ORDER — ASPIRIN 81 MG PO TBEC
81.0000 mg | DELAYED_RELEASE_TABLET | Freq: Every day | ORAL | Status: DC
  Administered 2024-01-22 – 2024-01-26 (×5): 81 mg via ORAL
  Filled 2024-01-22 (×5): qty 1

## 2024-01-22 MED ORDER — CHLORHEXIDINE GLUCONATE 0.12 % MT SOLN
OROMUCOSAL | Status: AC
  Administered 2024-01-22: 15 mL via OROMUCOSAL
  Filled 2024-01-22: qty 15

## 2024-01-22 MED ORDER — ONDANSETRON HCL 4 MG/2ML IJ SOLN
INTRAMUSCULAR | Status: DC | PRN
  Administered 2024-01-22: 4 mg via INTRAVENOUS

## 2024-01-22 MED ORDER — PHENYLEPHRINE HCL (PRESSORS) 10 MG/ML IV SOLN
INTRAVENOUS | Status: DC | PRN
  Administered 2024-01-22: 80 ug via INTRAVENOUS

## 2024-01-22 MED ORDER — DEXAMETHASONE SODIUM PHOSPHATE 10 MG/ML IJ SOLN
INTRAMUSCULAR | Status: DC | PRN
  Administered 2024-01-22: 4 mg via INTRAVENOUS

## 2024-01-22 SURGICAL SUPPLY — 71 items
ATTRACTOMAT 16X20 MAGNETIC DRP (DRAPES) ×1 IMPLANT
BAG BANDED W/RUBBER/TAPE 36X54 (MISCELLANEOUS) ×1 IMPLANT
BAG COUNTER SPONGE SURGICOUNT (BAG) ×4 IMPLANT
BAG SNAP BAND KOVER 36X36 (MISCELLANEOUS) ×1 IMPLANT
BALLN STERLING OTW 3X100X150 (BALLOONS) ×4 IMPLANT
BALLN STERLING OTW 5X150X150 (BALLOONS) ×4 IMPLANT
BALLOON STERLING OTW 3X100X150 (BALLOONS) ×1 IMPLANT
BALLOON STERLING OTW 5X150X150 (BALLOONS) ×1 IMPLANT
CANISTER SUCT 3000ML PPV (MISCELLANEOUS) ×4 IMPLANT
CATH AURYON ATHERECTOMY 1.5 (CATHETERS) ×1 IMPLANT
CATH BEACON 5 .035 65 KMP TIP (CATHETERS) IMPLANT
CATH EMB 4FR 40 (CATHETERS) ×1 IMPLANT
CATH MUSTANG 3X20X135 (BALLOONS) ×1 IMPLANT
CATH OMNI FLUSH 5F 65CM (CATHETERS) ×4 IMPLANT
CLIP TI MEDIUM 24 (CLIP) ×4 IMPLANT
CLIP TI WIDE RED SMALL 24 (CLIP) ×4 IMPLANT
COVER DOME SNAP 22 D (MISCELLANEOUS) ×1 IMPLANT
DERMABOND ADVANCED .7 DNX12 (GAUZE/BANDAGES/DRESSINGS) ×4 IMPLANT
DERMABOND ADVANCED .7 DNX6 (GAUZE/BANDAGES/DRESSINGS) ×1 IMPLANT
DRAIN CHANNEL 15F RND FF W/TCR (WOUND CARE) IMPLANT
DRAPE C-ARM 42X72 X-RAY (DRAPES) IMPLANT
DRAPE X-RAY CASS 24X20 (DRAPES) IMPLANT
DRSG TEGADERM 4X4.75 (GAUZE/BANDAGES/DRESSINGS) ×2 IMPLANT
ELECT REM PT RETURN 9FT ADLT (ELECTROSURGICAL) ×4 IMPLANT
ELECTRODE REM PT RTRN 9FT ADLT (ELECTROSURGICAL) ×4 IMPLANT
EVACUATOR SILICONE 100CC (DRAIN) IMPLANT
GLOVE SURG SS PI 7.5 STRL IVOR (GLOVE) ×12 IMPLANT
GOWN STRL REUS W/ TWL LRG LVL3 (GOWN DISPOSABLE) ×8 IMPLANT
GOWN STRL REUS W/ TWL XL LVL3 (GOWN DISPOSABLE) ×4 IMPLANT
GRAFT CV 30X6KNTD STRG TUBE (Vascular Products) ×1 IMPLANT
GRAFT SKIN WND MICRO 38 (Tissue) ×1 IMPLANT
HEMOSTAT SNOW SURGICEL 2X4 (HEMOSTASIS) IMPLANT
KIT BASIN OR (CUSTOM PROCEDURE TRAY) ×4 IMPLANT
KIT ENCORE 26 ADVANTAGE (KITS) ×1 IMPLANT
KIT TURNOVER KIT B (KITS) ×4 IMPLANT
NS IRRIG 1000ML POUR BTL (IV SOLUTION) ×8 IMPLANT
PACK ENDO MINOR (CUSTOM PROCEDURE TRAY) ×4 IMPLANT
PACK PERIPHERAL VASCULAR (CUSTOM PROCEDURE TRAY) ×4 IMPLANT
PAD ARMBOARD 7.5X6 YLW CONV (MISCELLANEOUS) ×8 IMPLANT
PATCH VASC XENOSURE 1X6 (Vascular Products) IMPLANT
SET COLLECT BLD 21X3/4 12 (NEEDLE) IMPLANT
SET MICROPUNCTURE 5F STIFF (MISCELLANEOUS) ×4 IMPLANT
SET WALTER ACTIVATION W/DRAPE (SET/KITS/TRAYS/PACK) IMPLANT
SHEATH CATAPULT 7FR 45 (SHEATH) ×1 IMPLANT
SHEATH PINNACLE 5F 10CM (SHEATH) ×4 IMPLANT
SHEATH PINNACLE 8F 10CM (SHEATH) ×4 IMPLANT
SPONGE INTESTINAL PEANUT (DISPOSABLE) ×4 IMPLANT
SPONGE T-LAP 18X18 ~~LOC~~+RFID (SPONGE) ×1 IMPLANT
STOPCOCK 4 WAY LG BORE MALE ST (IV SETS) ×1 IMPLANT
STOPCOCK MORSE 400PSI 3WAY (MISCELLANEOUS) ×5 IMPLANT
SURGIFLO W/THROMBIN 8M KIT (HEMOSTASIS) ×1 IMPLANT
SUT ETHILON 3 0 PS 1 (SUTURE) IMPLANT
SUT PROLENE 5 0 C 1 24 (SUTURE) ×5 IMPLANT
SUT PROLENE 6 0 BV (SUTURE) ×12 IMPLANT
SUT VIC AB 2-0 CT1 TAPERPNT 27 (SUTURE) ×4 IMPLANT
SUT VIC AB 3-0 SH 27X BRD (SUTURE) ×4 IMPLANT
SUT VIC AB 3-0 X1 27 (SUTURE) ×4 IMPLANT
SYR 10ML LL (SYRINGE) ×2 IMPLANT
SYR 20ML LL LF (SYRINGE) ×1 IMPLANT
SYR 3ML LL SCALE MARK (SYRINGE) ×1 IMPLANT
SYR MEDRAD MARK V 150ML (SYRINGE) ×4 IMPLANT
TOWEL GREEN STERILE (TOWEL DISPOSABLE) ×4 IMPLANT
TUBING EXTENTION W/L.L. (IV SETS) IMPLANT
TUBING HIGH PRESSURE 120CM (CONNECTOR) ×4 IMPLANT
UNDERPAD 30X36 HEAVY ABSORB (UNDERPADS AND DIAPERS) ×4 IMPLANT
WATER STERILE IRR 1000ML POUR (IV SOLUTION) ×4 IMPLANT
WIRE AMPLATZ SS-J .035X180CM (WIRE) IMPLANT
WIRE AMPLATZ SS-J .035X260CM (WIRE) IMPLANT
WIRE BENTSON .035X145CM (WIRE) ×4 IMPLANT
WIRE G V18X300 ST (WIRE) ×1 IMPLANT
WIRE SPARTACORE .014X300CM (WIRE) ×1 IMPLANT

## 2024-01-22 NOTE — Interval H&P Note (Signed)
History and Physical Interval Note:  01/22/2024 12:00 PM  [name] [name]  has presented today for surgery, with the diagnosis of Atherosclerosis of native arteries of the extremities with ulceration; Peripheral Artery Disease.  The various methods of treatment have been discussed with the patient and family. After consideration of risks, benefits and other options for treatment, the patient has consented to  Procedure(s): LEFT ENDARTERECTOMY FEMORAL WITH LEFT POPLITEAL STENT (Left) LEFT ENDARTERECTOMY FEMORAL WITH LEFT POPLITEAL STENT (Left) as a surgical intervention.  The patient's history has been reviewed, patient examined, no change in status, stable for surgery.  I have reviewed the patient's chart and labs.  Questions were answered to the patient's satisfaction.     Durene Cal

## 2024-01-22 NOTE — Anesthesia Procedure Notes (Signed)
Spinal  Patient location during procedure: pre-op Start time: 01/22/2024 12:14 PM End time: 01/22/2024 12:20 PM Reason for block: surgical anesthesia Staffing Performed: anesthesiologist  Anesthesiologist: Val Eagle, MD Performed by: Val Eagle, MD Authorized by: Val Eagle, MD   Preanesthetic Checklist Completed: patient identified, IV checked, risks and benefits discussed, surgical consent, monitors and equipment checked, pre-op evaluation and timeout performed Spinal Block Patient position: sitting Prep: DuraPrep Patient monitoring: heart rate, cardiac monitor, continuous pulse ox and blood pressure Approach: midline Location: L4-5 Injection technique: single-shot Needle Needle type: Pencan  Needle gauge: 24 G Needle length: 9 cm Assessment Sensory level: T8 Events: CSF return

## 2024-01-22 NOTE — TOC Transition Note (Signed)
Transition of Care [facility]) - Discharge Note   Patient Details  Name: [name] [name] MRN: [id number] Date of Birth: April 21, 1929  [facility] CM/SW Contact:  Eduard Roux, LCSW Phone Number: 01/22/2024, 12:33 PM   Clinical Narrative:     Patient off unit for procedure CSW spoke with patient's daughter, [name]. CSW introduced self and explained role. [name] confirmed patient if from [facility]. She advised the plan is for patient return to ALF once stable. CSW explained may be recommended for SNF before returning to ALF, depending on progress. Informed TOC will continue to follow and assist with discharge planning.  She states understanding.   Antony Blackbird, MSW, LCSW Clinical Social Worker      Barriers to Discharge: Continued Medical Work up   Patient Goals and CMS Choice            Discharge Placement                       Discharge Plan and Services Additional resources added to the After Visit Summary for                                       Social Drivers of Health (SDOH) Interventions SDOH Screenings   Food Insecurity: No Food Insecurity (01/22/2024)  Housing: Unknown (01/22/2024)  Transportation Needs: No Transportation Needs (01/22/2024)  Utilities: Not At Risk (01/22/2024)  Financial Resource Strain: Low Risk  (09/22/2023)   Received from [facility]  Social Connections: Unknown (01/22/2024)  Tobacco Use: Low Risk  (01/22/2024)     Readmission Risk Interventions     No data to display

## 2024-01-22 NOTE — Anesthesia Procedure Notes (Signed)
Arterial Line Insertion Start/End1/24/2025 11:42 AM, 01/22/2024 11:50 AM Performed by: Val Eagle, MD  Patient location: Pre-op. Preanesthetic checklist: patient identified, IV checked, site marked, risks and benefits discussed, surgical consent, monitors and equipment checked, pre-op evaluation, timeout performed and anesthesia consent Lidocaine 1% used for infiltration Right, radial was placed Catheter size: 20 G Hand hygiene performed  and maximum sterile barriers used   Attempts: 1 Procedure performed without using ultrasound guided technique. Following insertion, dressing applied. Post procedure assessment: normal and unchanged  Patient tolerated the procedure well with no immediate complications.

## 2024-01-22 NOTE — Transfer of Care (Signed)
Immediate Anesthesia Transfer of Care Note  Patient: [name] [name]  Procedure(s) Performed: LEFT COMMON FEMORAL ENDARTERECTOMY WITH VEIN PATCH (Left: Leg Lower) LASER ATHERECTOMY OF TIBIAL PERONEAL TRUNK AND POPLITEAL ARTERY (Left: Leg Upper) BALLOON ANGIOPLASTY OF TIBIAL PERONEAL TRUNK AND POSTERIOR TBIAL ARTERY AND DRUG COATED BALLOON ANGIOPLASTY OF POPLITEAL ARTERY (Left: Groin) BYPASS GRAFT LEFT COMMON FEMORAL TO PROFUNDA FEMORAL ARTERY USING DACRON GRAFT (Left: Groin) APPLICATION OF SKIN SUBSTITUTE, KERECIS (Left: Groin)  Patient Location: PACU  Anesthesia Type:MAC and Spinal  Level of Consciousness: awake and alert   Airway & Oxygen Therapy: Patient Spontanous Breathing and Patient connected to face mask oxygen  Post-op Assessment: Report given to RN and Post -op Vital signs reviewed and stable  Post vital signs: Reviewed and stable  Last Vitals:  Vitals Value Taken Time  BP 94/53 01/22/24 1732  Temp    Pulse 73 01/22/24 1733  Resp 16 01/22/24 1733  SpO2 100 % 01/22/24 1733  Vitals shown include unfiled device data.  Last Pain:  Vitals:   01/22/24 1220  TempSrc:   PainSc: 0-No pain      Patients Stated Pain Goal: 0 (01/22/24 1200)  Complications: No notable events documented.

## 2024-01-22 NOTE — Progress Notes (Addendum)
  Progress Note    01/22/2024 8:03 AM * Day of Surgery *  Subjective:  Ready for surgery   Vitals:   01/22/24 0326 01/22/24 0757  BP: 124/76 138/61  Pulse:    Resp:  20  Temp: (!) 97.5 F (36.4 C) 97.8 F (36.6 C)  SpO2: 98%    Physical Exam: Lungs:  non labored Extremities:  wounds pictured below Neurologic: A&O         CBC    Component Value Date/Time   WBC 9.6 01/22/2024 0339   RBC 2.77 (L) 01/22/2024 0339   HGB 9.2 (L) 01/22/2024 0339   HGB 11.8 (L) 02/04/2015 1001   HCT 27.9 (L) 01/22/2024 0339   HCT 35.7 (L) 02/04/2015 1001   PLT 194 01/22/2024 0339   PLT 167 02/04/2015 1001   MCV 100.7 (H) 01/22/2024 0339   MCV 108 (H) 02/04/2015 1001   MCH 33.2 01/22/2024 0339   MCHC 33.0 01/22/2024 0339   RDW 20.8 (H) 01/22/2024 0339   RDW 20.0 (H) 02/04/2015 1001   LYMPHSABS 1.5 01/21/2024 2056   MONOABS 1.4 (H) 01/21/2024 2056   EOSABS 0.3 01/21/2024 2056   BASOSABS 0.1 01/21/2024 2056    BMET    Component Value Date/Time   NA 139 01/22/2024 0339   NA 141 02/04/2015 1001   K 4.1 01/22/2024 0339   K 4.2 02/04/2015 1001   CL 103 01/22/2024 0339   CL 106 02/04/2015 1001   CO2 24 01/22/2024 0339   CO2 30 02/04/2015 1001   GLUCOSE 111 (H) 01/22/2024 0339   GLUCOSE 122 (H) 02/04/2015 1001   BUN 23 01/22/2024 0339   BUN 19 (H) 02/04/2015 1001   CREATININE 1.08 01/22/2024 0339   CREATININE 1.26 02/04/2015 1001   CALCIUM 9.1 01/22/2024 0339   CALCIUM 8.5 02/04/2015 1001   GFRNONAA >60 01/22/2024 0339   GFRNONAA 58 (L) 02/04/2015 1001   GFRNONAA >60 11/06/2013 1304   GFRAA >60 02/05/2019 0121   GFRAA >60 02/04/2015 1001   GFRAA >60 11/06/2013 1304    INR    Component Value Date/Time   INR 1.1 02/20/2023 0810   INR 1.4 02/04/2015 1001    No intake or output data in the 24 hours ending 01/22/24 0803   Assessment/Plan:  89 y.o. male with CLI of LLE  Eliquis has been held per patient Plan is for L CFA endarterectomy with antegrade  angioplasty/stenting of SFA/popliteal in OR today with Dr. Myra Gianotti Continue npo Consent ordered   Emilie Rutter, PA-C [facility] [phone] 01/22/2024 8:03 AM

## 2024-01-22 NOTE — Op Note (Addendum)
 Patient name: [name] [name] MRN: [id number] DOB: 1929/07/25 Sex: male  01/22/2024 Pre-operative Diagnosis: Left heel ulcer Post-operative diagnosis:  Same Surgeon:  Durene Cal Assistants:  Aggie Moats, PA Procedure:   #1: Left femoral endarterectomy with vein patch angioplasty   #2: Left profundofemoral bypass graft with 6 mm dacryon   #3: Left leg angiogram   #4: Laser atherectomy of the left popliteal artery and tibioperoneal trunk   #5: Angioplasty of the left posterior tibial artery and tibioperoneal trunk   #6: Drug-coated balloon angioplasty, left popliteal artery   #7: Application of first 38 cm skin substitute (Kerecis) Anesthesia:  Spinal Blood Loss:  400 Specimens:  none  Findings: Extensive exophytic plaque in the common femoral and proximal superficial femoral artery which was removed with endarterectomy.  There was also extensive plaque going into the profunda for about 2 cm before it had 3 primary branches.  Vein patch angioplasty was used on the common femoral artery and the superficial femoral artery for approximately 1.5 cm.  When performing the profunda endarterectomy, the artery was thinned out such that it tore and could not be repaired and so I placed a small interposition 6 mm dacryon graft beginning at the origin of the profundofemoral artery down to the profunda right before it trifurcated.  I then formed laser atherectomy of the in-stent stenosis as well as the native tibioperoneal trunk.  This was followed by angioplasty of the posterior tibial and tibioperoneal trunk and drug-coated balloon angioplasty of the popliteal artery.  There was some residual stenosis however it did not appear to be hemodynamically significant  Indications: This is a 89 year old gentleman with nonhealing left heel pressure sore.  He was scheduled for left femoral endarterectomy and popliteal and tibial intervention at [facility] however anesthesia did not want to put him to sleep and  so he was transferred to [facility].  I had an extensive conversation with the family detailing the risks of surgery.  I think that he is at risk for limb loss without revascularization.  All questions were answered.  Procedure:  The patient was identified in the holding area and taken to [facility] OR ROOM 16  The patient was then placed supine on the table. spinal anesthesia was administered.  The patient was prepped and draped in the usual sterile fashion.  A time out was called and antibiotics were administered.  A PA was necessary to expedite the procedure and assist with technical details.  He helped with exposure by providing suction and retraction.  He help with the anastomoses by following the suture.  He helped with wire exchanges.  An oblique incision was made just above the groin crease.  Cautery was used to divide subcutaneous tissue down the femoral sheath which was opened sharply.  I divided the circumflex iliac vein under the inguinal ligament.  I exposed the distal external iliac artery and then the superficial femoral artery for about 4 cm.  There was a long profunda trough before it trifurcated.  I individually isolated the 3 primary profunda branches.  There was significant plaque in the common femoral and proximal superficial femoral artery as well as the profundofemoral artery down to its branches.  Next the patient was fully heparinized.  I then cannulated the superficial femoral artery with a micropuncture needle and advanced an 018 wire followed by micropuncture sheath.  A 035 wire was then placed and a 7 French 45 cm sheath was inserted into the proximal left superficial femoral artery.  Angiography  was then performed that showed in-stent stenosis within the popliteal stent and hemodynamically significant stenosis in the tibioperoneal trunk.  The posterior tibial is a dominant runoff.  I then advanced the wire into the posterior tibial artery.  I then performed laser atherectomy using a 1.5 laser  within the in-stent stenosis.  I then performed laser atherectomy of the native distal below-knee popliteal artery and tibioperoneal trunk.  Next, a 3 mm balloon was used to perform balloon angioplasty of the posterior tibial artery and tibioperoneal trunk.  This was followed by drug-coated balloon angioplasty using a 4 x 150 Ranger balloon within the previously placed stent and distal native superficial femoral artery.  Completion imaging showed residual stenosis within the stent however contrast moved through this area very quickly.  There is also residual high-grade stenosis in the tibioperoneal trunk.  I inserted a 3 mm Sterling balloon again however this balloon popped on 2 separate occasions.  I then used a 3 x 20 Mustang balloon and treated the tibioperoneal trunk and distal popliteal artery again.  On completion imaging this area appeared to be free of stenosis.  There was residual narrowing in the stents however this did not appear to hold up the contrast.  Because of the time taken to this point I was worried about the duration of the spinal anesthesia and so I elected to stop and proceed with the femoral endarterectomy.  She and the superficial femoral artery was then removed.  I occluded the distal external iliac artery and the profundofemoral branches as well as the superficial femoral artery.  Potts scissors were then used to open the common femoral artery up to the inguinal ligament and then down onto the superficial femoral artery for about 2 cm.  A Madelaine Etienne elevator was used to perform endarterectomy.  I was able to get a good distal endpoint.  There was exophytic plaque that was nearly occlusive in the common femoral artery.  I was also able to get a good endpoint in the distal external iliac artery.  I then performed eversion endarterectomy in the profundofemoral artery to remove several exophytic lesions that were greater than 80%.  In removing the plaque, the adventitia was disrupted.   There was a hole in the mid profundofemoral artery.  I elected to perform a vein patch angioplasty using the anterior saphenous vein.  This was done with 5-0 Prolene on the common femoral and superficial femoral artery.  Once this was completed I opened the whole that was created in the profundofemoral artery down to the trifurcation.  I was able to remove the majority of the plaque with no residual stenosis however the midportion of the profundofemoral artery was completely disrupted and I did not feel it could be repaired.  Therefore I selected a 6 mm dacryon graft for interposition grafting.  I performed a end-to-end anastomosis to the profundofemoral artery just above the trifurcation with 6-0 Prolene.  This was then cut the appropriate length and the proximal anastomosis was to the origin of the profundofemoral artery and a end to end anastomosis.  Prior to completion, the appropriate flushing maneuvers were performed and the anastomosis was completed.  The clamps were then released.  Patient had excellent profundus and superficial femoral artery Doppler signals.  I was satisfied with these results.  The patient's heparin was reversed with 50 mg of protamine.  The wound was then irrigated.  There was some oozing from the posterior side of the artery that I could not adequately  visualize and so I used Surgi-Flo and manual pressure until this had resolved.  Once there was no further bleeding, I closed the femoral sheath with a running 2-0 Vicryl.  Subcutaneous tissue was then closed with 2 additional layers with 3-0 Vicryl followed by subcuticular closure..  The patient tolerated the procedure well and was taken recovery in stable condition.   Disposition: To PACU stable.   Juleen China, M.D., [facility] [place] Office: [phone] Pager:  [phone]

## 2024-01-23 LAB — BASIC METABOLIC PANEL
Anion gap: 7 (ref 5–15)
BUN: 17 mg/dL (ref 8–23)
CO2: 23 mmol/L (ref 22–32)
Calcium: 8.5 mg/dL — ABNORMAL LOW (ref 8.9–10.3)
Chloride: 109 mmol/L (ref 98–111)
Creatinine, Ser: 0.98 mg/dL (ref 0.61–1.24)
GFR, Estimated: >60 mL/min (ref >60)
Glucose, Bld: 123 mg/dL — ABNORMAL HIGH (ref 70–99)
Potassium: 4.1 mmol/L (ref 3.5–5.1)
Sodium: 139 mmol/L (ref 135–145)

## 2024-01-23 LAB — CBC
HCT: 22.9 % — ABNORMAL LOW (ref 39.0–52.0)
Hemoglobin: 7.7 g/dL — ABNORMAL LOW (ref 13.0–17.0)
MCH: 32.8 pg (ref 26.0–34.0)
MCHC: 33.6 g/dL (ref 30.0–36.0)
MCV: 97.4 fL (ref 80.0–100.0)
Platelets: 150 10*3/uL (ref 150–400)
RBC: 2.35 MIL/uL — ABNORMAL LOW (ref 4.22–5.81)
RDW: 21.7 % — ABNORMAL HIGH (ref 11.5–15.5)
WBC: 10.1 10*3/uL (ref 4.0–10.5)
nRBC: 0 % (ref 0.0–0.2)

## 2024-01-23 NOTE — Evaluation (Signed)
Physical Therapy Evaluation Patient Details Name: [name] [name] MRN: [id number] DOB: 1929-07-06 Today's Date: 01/23/2024  History of Present Illness  Pt is 89 yo presenting from [facility] for direction admission to [facility] on 01/21/24 for L femoral endarterectomy with popliteal artery stenting. Pt s/p surgery on 01/22/24. PMH: A fib on eliquis, MI s/p CABG, ischemic cardiomyopathy, HFEF, Hypotension, Hypothyroidism, cx of head/neck.  Clinical Impression  Pt is presenting below baseline level of functioning. Currently pt is Max A for sit to stand and step pivot transfer. Pt is Min A for bed mobility. Due to pt current functional status, home set up and available assistance at home recommending skilled physical therapy services < 3 hours/day in order to address strength, balance and functional mobility to decrease risk for falls, injury, immobility, skin break down and re-hospitalization.          If plan is discharge home, recommend the following: Two people to help with walking and/or transfers;Assistance with cooking/housework;Assist for transportation;Help with stairs or ramp for entrance   Can travel by private vehicle   No    Equipment Recommendations None recommended by PT     Functional Status Assessment Patient has had a recent decline in their functional status and demonstrates the ability to make significant improvements in function in a reasonable and predictable amount of time.     Precautions / Restrictions Precautions Precautions: Fall Restrictions Weight Bearing Restrictions Per Provider Order: No      Mobility  Bed Mobility Overal bed mobility: Needs Assistance Bed Mobility: Supine to Sit     Supine to sit: Min assist, HOB elevated     General bed mobility comments: Min A for trunk to mid line. Pt has a bed rail on a regular bed at ALF    Transfers Overall transfer level: Needs assistance Equipment used: Rolling walker (2  wheels) Transfers: Sit to/from Stand, Bed to chair/wheelchair/BSC Sit to Stand: Max assist, +2 safety/equipment   Step pivot transfers: Max assist, +2 safety/equipment       General transfer comment: Max A with heavy multi modal cueing 2 person for safety due to instability and increased time to take steps. pt performed 2x during session with continues Max A with heavy cueing for hand placement. Pt was able on second attempt to WB through LLE 3x with very short steps and pivoting on the RLE rather than fully WB through LLE. Required Max A to guide hips toward recliner due to pt sitting early due to fatigue and pain in the LLE.    Ambulation/Gait     Pre-gait activities: Pt took 2-3 steps from EOB to recliner with Max A through RW. Pt was able to partially lift R foot and pivot on forefoot while progressing LLE for 2-3 steps with heavy landing demonstrating poor eccentric control.        Balance Overall balance assessment: Needs assistance Sitting-balance support: Bilateral upper extremity supported, Feet unsupported Sitting balance-Leahy Scale: Good     Standing balance support: During functional activity, Reliant on assistive device for balance, Bilateral upper extremity supported Standing balance-Leahy Scale: Poor Standing balance comment: Pt requires external support at Max A to maintain balance during transfers           Pertinent Vitals/Pain Pain Assessment Pain Assessment: Faces Faces Pain Scale: Hurts even more Pain Location: L foot in WB Pain Descriptors / Indicators: Aching, Burning Pain Intervention(s): Monitored during session, Limited activity within patient's tolerance    Home Living  Family/patient expects to be discharged to:: Assisted living ([facility])       Home Equipment: Standard Walker;Cane - single point;Shower seat      Prior Function     Mobility Comments: uses a rollator. Was getting HHPT at ALF. Able to perform transfers from bed to W/C and  toilet. ADLs Comments: gets help wiping     Extremity/Trunk Assessment   Upper Extremity Assessment Upper Extremity Assessment: Defer to OT evaluation    Lower Extremity Assessment Lower Extremity Assessment: Generalized weakness;LLE deficits/detail LLE Deficits / Details: pain in the L foot which makes WB difficult    Cervical / Trunk Assessment Cervical / Trunk Assessment: Kyphotic  Communication   Communication Communication: Hearing impairment Cueing Techniques: Verbal cues;Tactile cues  Cognition Arousal: Alert Behavior During Therapy: WFL for tasks assessed/performed Overall Cognitive Status: Within Functional Limits for tasks assessed   General Comments: daughter present and assisted with history        General Comments General comments (skin integrity, edema, etc.): Daughter present throughout session.        Assessment/Plan    PT Assessment Patient needs continued PT services  PT Problem List Decreased strength;Decreased balance;Decreased activity tolerance;Decreased mobility;Decreased safety awareness;Pain       PT Treatment Interventions DME instruction;Therapeutic activities;Gait training;Therapeutic exercise;Balance training;Functional mobility training;Patient/family education    PT Goals (Current goals can be found in the Care Plan section)  Acute Rehab PT Goals Patient Stated Goal: To return to ALF with a higher level of care. PT Goal Formulation: With patient/family Time For Goal Achievement: 02/06/24 Potential to Achieve Goals: Fair    Frequency Min 1X/week        AM-PAC PT "6 Clicks" Mobility  Outcome Measure Help needed turning from your back to your side while in a flat bed without using bedrails?: A Little Help needed moving from lying on your back to sitting on the side of a flat bed without using bedrails?: A Little Help needed moving to and from a bed to a chair (including a wheelchair)?: A Lot Help needed standing up from a chair  using your arms (e.g., wheelchair or bedside chair)?: A Lot Help needed to walk in hospital room?: Total Help needed climbing 3-5 steps with a railing? : Total 6 Click Score: 12    End of Session Equipment Utilized During Treatment: Gait belt Activity Tolerance: Patient tolerated treatment well;Patient limited by pain Patient left: in chair;with chair alarm set;with call bell/phone within reach;with family/visitor present;Other (comment) (OT in room) Nurse Communication: Mobility status PT Visit Diagnosis: Other abnormalities of gait and mobility (R26.89);Unsteadiness on feet (R26.81);Pain Pain - Right/Left: Left Pain - part of body: Ankle and joints of foot    Time: 1610-9604 PT Time Calculation (min) (ACUTE ONLY): 27 min   Charges:   PT Evaluation $PT Eval Low Complexity: 1 Low   PT General Charges $$ ACUTE PT VISIT: 1 Visit        Harrel Carina, DPT, CLT  Acute Rehabilitation Services Office: [phone] (Secure chat preferred)   Claudia Desanctis 01/23/2024, 2:07 PM

## 2024-01-23 NOTE — Progress Notes (Addendum)
  Progress Note    01/23/2024 9:03 AM 1 Day Post-Op  Subjective:  no complaints   Vitals:   01/23/24 0400 01/23/24 0750  BP: (!) 120/51 (!) 120/56  Pulse: 82   Resp: 18 19  Temp:  98.7 F (37.1 C)  SpO2: 99% 98%   Physical Exam: Lungs:  non labored Incisions:  L groin c/d/i Extremities:  brisk L PT by doppler Neurologic: A&O  CBC    Component Value Date/Time   WBC 10.1 01/23/2024 0430   RBC 2.35 (L) 01/23/2024 0430   HGB 7.7 (L) 01/23/2024 0430   HGB 11.8 (L) 02/04/2015 1001   HCT 22.9 (L) 01/23/2024 0430   HCT 35.7 (L) 02/04/2015 1001   PLT 150 01/23/2024 0430   PLT 167 02/04/2015 1001   MCV 97.4 01/23/2024 0430   MCV 108 (H) 02/04/2015 1001   MCH 32.8 01/23/2024 0430   MCHC 33.6 01/23/2024 0430   RDW 21.7 (H) 01/23/2024 0430   RDW 20.0 (H) 02/04/2015 1001   LYMPHSABS 1.5 01/21/2024 2056   MONOABS 1.4 (H) 01/21/2024 2056   EOSABS 0.3 01/21/2024 2056   BASOSABS 0.1 01/21/2024 2056    BMET    Component Value Date/Time   NA 139 01/23/2024 0430   NA 141 02/04/2015 1001   K 4.1 01/23/2024 0430   K 4.2 02/04/2015 1001   CL 109 01/23/2024 0430   CL 106 02/04/2015 1001   CO2 23 01/23/2024 0430   CO2 30 02/04/2015 1001   GLUCOSE 123 (H) 01/23/2024 0430   GLUCOSE 122 (H) 02/04/2015 1001   BUN 17 01/23/2024 0430   BUN 19 (H) 02/04/2015 1001   CREATININE 0.98 01/23/2024 0430   CREATININE 1.26 02/04/2015 1001   CALCIUM 8.5 (L) 01/23/2024 0430   CALCIUM 8.5 02/04/2015 1001   GFRNONAA >60 01/23/2024 0430   GFRNONAA 58 (L) 02/04/2015 1001   GFRNONAA >60 11/06/2013 1304   GFRAA >60 02/05/2019 0121   GFRAA >60 02/04/2015 1001   GFRAA >60 11/06/2013 1304    INR    Component Value Date/Time   INR 1.1 02/20/2023 0810   INR 1.4 02/04/2015 1001     Intake/Output Summary (Last 24 hours) at 01/23/2024 2956 Last data filed at 01/23/2024 2130 Gross per 24 hour  Intake 2015 ml  Output 2050 ml  Net -35 ml     Assessment/Plan:  89 y.o. male is s/p L  iliofemoral endarterectomy and vein patch angioplasty with profunda bypass and laser atherectomy and balloon angioplasty of pop/tibials 1 Day Post-Op   LLE well perfused with brisk PT by doppler L groin incision well appearing without hematoma; gauze to L groin when in bed to wick moisture OOB today Offload L heel when in bed ABL anemia: Hgb 7.7; monitor CBC, may need transfusion Possible d/c home tomorrow   Emilie Rutter, PA-C [facility] [phone] 01/23/2024 9:03 AM  VASCULAR STAFF ADDENDUM: I have independently interviewed and examined the patient. I agree with the above.  Continue to monitor. Possible DC tomorrow pending stabilization of hgb  Daria Pastures MD [facility] Phone Number: [phone] 01/23/2024 9:19 AM

## 2024-01-23 NOTE — Anesthesia Postprocedure Evaluation (Signed)
Anesthesia Post Note  Patient: [name] [name]  Procedure(s) Performed: LEFT COMMON FEMORAL ENDARTERECTOMY WITH VEIN PATCH (Left: Leg Lower) LASER ATHERECTOMY OF TIBIAL PERONEAL TRUNK AND POPLITEAL ARTERY (Left: Leg Upper) BALLOON ANGIOPLASTY OF TIBIAL PERONEAL TRUNK AND POSTERIOR TBIAL ARTERY AND DRUG COATED BALLOON ANGIOPLASTY OF POPLITEAL ARTERY (Left: Groin) BYPASS GRAFT LEFT COMMON FEMORAL TO PROFUNDA  ARTERY USING DACRON GRAFT (Left: Groin) APPLICATION OF SKIN SUBSTITUTE, KERECIS (Left: Groin)     Patient location during evaluation: PACU Anesthesia Type: Spinal Level of consciousness: awake, awake and alert and oriented Pain management: pain level controlled Vital Signs Assessment: post-procedure vital signs reviewed and stable Respiratory status: spontaneous breathing, nonlabored ventilation and respiratory function stable Cardiovascular status: blood pressure returned to baseline and stable Postop Assessment: no headache, no backache, spinal receding and no apparent nausea or vomiting Anesthetic complications: no   No notable events documented.  Last Vitals:  Vitals:   01/23/24 0750 01/23/24 1225  BP: (!) 120/56 (!) 94/51  Pulse:  73  Resp: 19 20  Temp: 37.1 C 36.8 C  SpO2: 98% 96%    Last Pain:  Vitals:   01/23/24 1225  TempSrc: Oral  PainSc:                  Collene Schlichter

## 2024-01-23 NOTE — Evaluation (Signed)
Occupational Therapy Evaluation Patient Details Name: [name] [name] MRN: [id number] DOB: 01/15/1929 Today's Date: 01/23/2024   History of Present Illness Pt is 89 yo presenting from [facility] for direction admission to [facility] on 01/21/24 for L femoral endarterectomy with popliteal artery stenting. Pt s/p surgery on 01/22/24. PMH: A fib on eliquis, MI s/p CABG, ischemic cardiomyopathy, HFEF, Hypotension, Hypothyroidism, cx of head/neck.   Clinical Impression   OT began session shortly after PT session ended, pt needing significant +2 assist to stand and struggles to put weight on LLE due to pain. Pt currently max to setup assist for ADLs, BP was running soft during session but daughter informed that he was off his BP medications due to the surgery. Pt with very limited standing tolerance as well, he would benefit from continued acute skilled OT services to address listed deficits and help transition to next level of care. Patient would benefit from post acute skilled rehab facility with <3 hours of therapy and 24/7 support OR more support from his ALF.        If plan is discharge home, recommend the following: Two people to help with walking and/or transfers;Two people to help with bathing/dressing/bathroom;Assistance with cooking/housework;Help with stairs or ramp for entrance    Functional Status Assessment  Patient has had a recent decline in their functional status and demonstrates the ability to make significant improvements in function in a reasonable and predictable amount of time.  Equipment Recommendations  None recommended by OT (defer)    Recommendations for Other Services       Precautions / Restrictions Precautions Precautions: Fall Restrictions Weight Bearing Restrictions Per Provider Order: No      Mobility Bed Mobility Overal bed mobility: Needs Assistance Bed Mobility: Supine to Sit     Supine to sit: Min assist, HOB elevated      General bed mobility comments: Min A for trunk to mid line. Pt has a bed rail on a regular bed at ALF. Observed this in room during PT session    Transfers Overall transfer level: Needs assistance Equipment used: Rolling walker (2 wheels) Transfers: Sit to/from Stand, Bed to chair/wheelchair/BSC Sit to Stand: Max assist, +2 safety/equipment     Step pivot transfers: Max assist, +2 safety/equipment     General transfer comment: Max A with heavy multi modal cueing 2 person for safety due to instability and increased time to take steps. pt performed 2x during session with continues Max A with heavy cueing for hand placement. Pt was able on second attempt to WB through LLE 3x with very short steps and pivoting on the RLE rather than fully WB through LLE. Required Max A to guide hips toward recliner due to pt sitting early due to fatigue and pain in the LLE.      Balance Overall balance assessment: Needs assistance Sitting-balance support: Bilateral upper extremity supported, Feet unsupported Sitting balance-Leahy Scale: Good     Standing balance support: During functional activity, Reliant on assistive device for balance, Bilateral upper extremity supported Standing balance-Leahy Scale: Poor Standing balance comment: Pt requires external support at Max A to maintain balance during transfers                           ADL either performed or assessed with clinical judgement   ADL Overall ADL's : Needs assistance/impaired Eating/Feeding: Independent;Sitting   Grooming: Sitting;Wash/dry face;Set up   Upper Body Bathing: Sitting;Set up;Supervision/ safety  Lower Body Bathing: Sitting/lateral leans;Moderate assistance   Upper Body Dressing : Sitting;Set up   Lower Body Dressing: Sitting/lateral leans;Maximal assistance Lower Body Dressing Details (indicate cue type and reason): L shoe Toilet Transfer: +2 for physical assistance;+2 for safety/equipment;Stand-pivot;Maximal  assistance Toilet Transfer Details (indicate cue type and reason): Mod A +2 to Max +2 simulated during PT session with recliner Toileting- Clothing Manipulation and Hygiene: +2 for safety/equipment;Maximal assistance       Functional mobility during ADLs: +2 for physical assistance;Rolling walker (2 wheels);Maximal assistance       Vision         Perception         Praxis         Pertinent Vitals/Pain Pain Assessment Pain Assessment: Faces Faces Pain Scale: Hurts even more Pain Descriptors / Indicators: Aching, Burning Pain Intervention(s): Monitored during session, Limited activity within patient's tolerance     Extremity/Trunk Assessment Upper Extremity Assessment Upper Extremity Assessment: Generalized weakness   Lower Extremity Assessment Lower Extremity Assessment: Generalized weakness LLE Deficits / Details: pain in the L foot which makes WB difficult   Cervical / Trunk Assessment Cervical / Trunk Assessment: Kyphotic   Communication Communication Communication: Hearing impairment Cueing Techniques: Verbal cues;Tactile cues   Cognition Arousal: Alert Behavior During Therapy: WFL for tasks assessed/performed Overall Cognitive Status: Within Functional Limits for tasks assessed                                 General Comments: daughter present and assisted with history     General Comments  Daughter present throughout session. Bp soft, informed RN to which he reports will have midorine available soon    Exercises     Shoulder Instructions      Home Living Family/patient expects to be discharged to:: Assisted living ([facility])                             Home Equipment: Standard Walker;Cane - single point;Shower seat          Prior Functioning/Environment Prior Level of Function : Independent/Modified Independent;Driving;History of Falls (last six months)             Mobility Comments: uses a rollator. Was  getting HHPT at ALF. Able to perform transfers from bed to W/C and toilet. ADLs Comments: gets help wiping        OT Problem List: Decreased strength;Impaired balance (sitting and/or standing);Pain;Decreased activity tolerance      OT Treatment/Interventions: Self-care/ADL training;DME and/or AE instruction;Therapeutic exercise;Balance training;Therapeutic activities;Patient/family education    OT Goals(Current goals can be found in the care plan section) Acute Rehab OT Goals Patient Stated Goal: To go back to ALF OT Goal Formulation: With family Time For Goal Achievement: 02/06/24 Potential to Achieve Goals: Good ADL Goals Pt Will Perform Lower Body Dressing: sit to/from stand;with mod assist Pt Will Transfer to Toilet: stand pivot transfer;bedside commode;with min assist Pt/caregiver will Perform Home Exercise Program: Increased strength;Both right and left upper extremity;With Supervision;With written HEP provided;With theraband  OT Frequency: Min 1X/week    Co-evaluation              AM-PAC OT "6 Clicks" Daily Activity     Outcome Measure Help from another person eating meals?: None Help from another person taking care of personal grooming?: A Little Help from another person toileting, which includes using toliet, bedpan,  or urinal?: A Lot Help from another person bathing (including washing, rinsing, drying)?: A Lot Help from another person to put on and taking off regular upper body clothing?: A Little Help from another person to put on and taking off regular lower body clothing?: A Lot 6 Click Score: 16   End of Session Equipment Utilized During Treatment: Gait belt;Rolling walker (2 wheels) Nurse Communication: Mobility status  Activity Tolerance: Patient tolerated treatment well Patient left: in chair;with call bell/phone within reach;with chair alarm set;with family/visitor present  OT Visit Diagnosis: Muscle weakness (generalized) (M62.81);Other abnormalities of  gait and mobility (R26.89);Unsteadiness on feet (R26.81);Pain Pain - Right/Left: Left Pain - part of body: Ankle and joints of foot                Time: 1355-1409 OT Time Calculation (min): 14 min Charges:  OT General Charges $OT Visit: 1 Visit OT Evaluation $OT Eval Moderate Complexity: 1 Mod  .01/23/2024  AB, OTR/L  Acute Rehabilitation Services  Office: [phone]   Tristan Schroeder 01/23/2024, 3:56 PM

## 2024-01-24 LAB — CBC
HCT: 23.3 % — ABNORMAL LOW (ref 39.0–52.0)
Hemoglobin: 7.7 g/dL — ABNORMAL LOW (ref 13.0–17.0)
MCH: 32.2 pg (ref 26.0–34.0)
MCHC: 33 g/dL (ref 30.0–36.0)
MCV: 97.5 fL (ref 80.0–100.0)
Platelets: 131 10*3/uL — ABNORMAL LOW (ref 150–400)
RBC: 2.39 MIL/uL — ABNORMAL LOW (ref 4.22–5.81)
RDW: 21.6 % — ABNORMAL HIGH (ref 11.5–15.5)
WBC: 14.2 10*3/uL — ABNORMAL HIGH (ref 4.0–10.5)
nRBC: 0 % (ref 0.0–0.2)

## 2024-01-24 NOTE — Progress Notes (Addendum)
  Progress Note    01/24/2024 9:07 AM 2 Days Post-Op  Subjective:  some confusion this morning.  Without pain   Vitals:   01/24/24 0449 01/24/24 0900  BP: (!) 107/38 (!) 106/59  Pulse: 83 84  Resp: 16 20  Temp: 97.8 F (36.6 C) 98.2 F (36.8 C)  SpO2: 98% 99%   Physical Exam: Lungs:  non labored Incisions:  L groin c/d/i Extremities:  brisk L PT and DP by doppler Neurologic: some confusion  CBC    Component Value Date/Time   WBC 14.2 (H) 01/24/2024 0308   RBC 2.39 (L) 01/24/2024 0308   HGB 7.7 (L) 01/24/2024 0308   HGB 11.8 (L) 02/04/2015 1001   HCT 23.3 (L) 01/24/2024 0308   HCT 35.7 (L) 02/04/2015 1001   PLT 131 (L) 01/24/2024 0308   PLT 167 02/04/2015 1001   MCV 97.5 01/24/2024 0308   MCV 108 (H) 02/04/2015 1001   MCH 32.2 01/24/2024 0308   MCHC 33.0 01/24/2024 0308   RDW 21.6 (H) 01/24/2024 0308   RDW 20.0 (H) 02/04/2015 1001   LYMPHSABS 1.5 01/21/2024 2056   MONOABS 1.4 (H) 01/21/2024 2056   EOSABS 0.3 01/21/2024 2056   BASOSABS 0.1 01/21/2024 2056    BMET    Component Value Date/Time   NA 139 01/23/2024 0430   NA 141 02/04/2015 1001   K 4.1 01/23/2024 0430   K 4.2 02/04/2015 1001   CL 109 01/23/2024 0430   CL 106 02/04/2015 1001   CO2 23 01/23/2024 0430   CO2 30 02/04/2015 1001   GLUCOSE 123 (H) 01/23/2024 0430   GLUCOSE 122 (H) 02/04/2015 1001   BUN 17 01/23/2024 0430   BUN 19 (H) 02/04/2015 1001   CREATININE 0.98 01/23/2024 0430   CREATININE 1.26 02/04/2015 1001   CALCIUM 8.5 (L) 01/23/2024 0430   CALCIUM 8.5 02/04/2015 1001   GFRNONAA >60 01/23/2024 0430   GFRNONAA 58 (L) 02/04/2015 1001   GFRNONAA >60 11/06/2013 1304   GFRAA >60 02/05/2019 0121   GFRAA >60 02/04/2015 1001   GFRAA >60 11/06/2013 1304    INR    Component Value Date/Time   INR 1.1 02/20/2023 0810   INR 1.4 02/04/2015 1001     Intake/Output Summary (Last 24 hours) at 01/24/2024 6578 Last data filed at 01/24/2024 0450 Gross per 24 hour  Intake 240 ml  Output  850 ml  Net -610 ml     Assessment/Plan:  89 y.o. male is s/p  L iliofemoral endarterectomy and vein patch angioplasty with profunda bypass and laser atherectomy and balloon angioplasty of pop/tibials   2 Days Post-Op   LLE well perfused with brisk PT and DP by doppler L groin healing well; please place gauze in groin fold when in bed Continue to offload L heel ABL anemia: stable; monitor daily CBC; holding Eliquis Therapy teams recommending HH PT; TOC consulted D/c back to assisted living when [facility] approved   Emilie Rutter, PA-C [facility] [phone] 01/24/2024 9:07 AM  VASCULAR STAFF ADDENDUM: I have independently interviewed and examined the patient. I agree with the above.  Hgb stable at 7.7. Will hold AC one more day. Potential DC tomorrow.   Daria Pastures MD [facility] Phone Number: [phone] 01/24/2024 9:38 AM

## 2024-01-24 NOTE — TOC Initial Note (Addendum)
Transition of Care [facility]) - Initial/Assessment Note    Patient Details  Name: [name] [name] MRN: [id number] Date of Birth: 09-25-1929  Transition of Care [facility]) CM/SW Contact:    Jimmy Picket, LCSW Phone Number: 01/24/2024, 1:34 PM  Clinical Narrative:                   CSW received SNF consult. Pt is only oriented to self. CSW called pts daughter [name] on the phone. [name] states that pt resides at [facility] place in [place] (phone [phone]). [name] states that PTA pt was receiving PT/OT 2x a week and was getting assistance with ADL's, pt was able to transfer from bed to wheelchair.   CSW reviewed PT/OT recommendations for SNF. [name] reports that she would prefer for pt to return to ALF. CSW explained at due to him being Max A +2 that the ALF may not accept him back without SNF first. [name] stated she was confident tht they will take him back and declined pt being sent to SNF at this time. [name] suggest CSW speak to [facility] (or [facility] place. CSW called facility to get fax number to send PT notes for review, but was unable to get someone on the phone. CSW did not complete FL2 due to [facility] not want SNF at this time.   CSW will continue to follow.  Expected Discharge Plan: Assisted Living Barriers to Discharge: Continued Medical Work up   Patient Goals and CMS Choice Patient states their goals for this hospitalization and ongoing recovery are:: Daughter [name] states to return to ALF CMS Medicare.gov Compare Post Acute Care list provided to:: Patient Choice offered to / list presented to : Patient      Expected Discharge Plan and Services       Living arrangements for the past 2 months: Assisted Living Facility                                      Prior Living Arrangements/Services Living arrangements for the past 2 months: Assisted Living Facility Lives with:: Facility Resident Patient language and need for interpreter reviewed:: Yes Do you feel safe going  back to the place where you live?: Yes      Need for Family Participation in Patient Care: Yes (Comment) Care giver support system in place?: No (comment)   Criminal Activity/Legal Involvement Pertinent to Current Situation/Hospitalization: No - Comment as needed  Activities of Daily Living   ADL Screening (condition at time of admission) Independently performs ADLs?: Yes (appropriate for developmental age) Is the patient deaf or have difficulty hearing?: No Does the patient have difficulty seeing, even when wearing glasses/contacts?: No Does the patient have difficulty concentrating, remembering, or making decisions?: Yes  Permission Sought/Granted Permission sought to share information with : Facility Medical sales representative, Family Supports Permission granted to share information with : Yes, Verbal Permission Granted  Share Information with NAME: Glee Arvin , daughter [phone]     Permission granted to share info w Relationship: daughter  Permission granted to share info w Contact Information: [phone]  Emotional Assessment Appearance:: Appears stated age Attitude/Demeanor/Rapport: Unable to Assess Affect (typically observed): Unable to Assess Orientation: : Oriented to Self, Oriented to Place, Oriented to  Time, Oriented to Situation Alcohol / Substance Use: Not Applicable Psych Involvement: No (comment)  Admission diagnosis:  Critical limb ischemia of left lower extremity (HCC) [I70.222] Patient Active Problem List  Diagnosis Date Noted   Critical limb ischemia of left lower extremity (HCC) 01/21/2024   Pressure injury of skin of dorsum of left foot 09/27/2023   Fall 09/26/2023   HFrEF (heart failure with reduced ejection fraction) (HCC) 09/26/2023   Syncope 09/24/2023   Chronic HFrEF (heart failure with reduced ejection fraction) (HCC) 09/04/2021   Atherosclerosis of native arteries of the extremities with ulceration (HCC) 09/04/2021   Atherosclerosis of  artery of extremity with rest pain (HCC) 08/30/2021   Atherosclerosis of native arteries of extremity with intermittent claudication (HCC) 02/18/2021   Cataract cortical, senile 02/11/2021   Cor pulmonale, acute (HCC) 05/02/2020   Type 2 diabetes mellitus with peripheral angiopathy (HCC) 11/10/2018   Medicare annual wellness visit, initial 05/05/2017   Varicose veins of lower extremities with ulcer (HCC) 04/23/2017   Chronic venous insufficiency 04/23/2017   Leg pain 04/23/2017   Lymphedema 04/23/2017   A-fib (HCC) 04/23/2017   Hyperlipidemia 04/23/2017   Acquired hypothyroidism 11/03/2016   B12 deficiency 10/27/2014   CAD (coronary artery disease) 04/25/2014   GERD (gastroesophageal reflux disease) 04/25/2014   HTN (hypertension), benign 04/25/2014   S/P CABG x 3 02/26/1993   PCP:  [name], MD Pharmacy:   [facility]. [place] - [address] [place] [address] Phone: [phone] Fax: [phone]  [facility] [place], [place] [address] [place] [address] Phone: [phone] Fax: [phone]     Social Drivers of Health (SDOH) Social History: SDOH Screenings   Food Insecurity: No Food Insecurity (01/23/2024)  Housing: Low Risk  (01/23/2024)  Transportation Needs: No Transportation Needs (01/23/2024)  Utilities: Not At Risk (01/23/2024)  Financial Resource Strain: Low Risk  (09/22/2023)   Received from [facility]  Social Connections: Unknown (01/22/2024)  Tobacco Use: Low Risk  (01/22/2024)   SDOH Interventions:     Readmission Risk Interventions     No data to display

## 2024-01-24 NOTE — Plan of Care (Signed)
  Problem: Education: Goal: Knowledge of General Education information will improve Description: Including pain rating scale, medication(s)/side effects and non-pharmacologic comfort measures Outcome: Progressing   Problem: Clinical Measurements: Goal: Ability to maintain clinical measurements within normal limits will improve Outcome: Progressing Goal: Will remain free from infection Outcome: Progressing Goal: Respiratory complications will improve Outcome: Progressing Goal: Cardiovascular complication will be avoided Outcome: Progressing   Problem: Activity: Goal: Risk for activity intolerance will decrease Outcome: Progressing   Problem: Nutrition: Goal: Adequate nutrition will be maintained Outcome: Progressing   Problem: Pain Managment: Goal: General experience of comfort will improve and/or be controlled Outcome: Progressing   Problem: Safety: Goal: Ability to remain free from injury will improve Outcome: Progressing   Problem: Skin Integrity: Goal: Risk for impaired skin integrity will decrease Outcome: Progressing   Problem: Activity: Goal: Ability to tolerate increased activity will improve Outcome: Progressing   Problem: Skin Integrity: Goal: Demonstration of wound healing without infection will improve Outcome: Progressing

## 2024-01-25 ENCOUNTER — Encounter (HOSPITAL_COMMUNITY): Payer: Self-pay | Admitting: Surgery

## 2024-01-25 LAB — BPAM RBC
Blood Product Expiration Date: 202502152359
Blood Product Expiration Date: 202502152359
ISSUE DATE / TIME: 202501241541
ISSUE DATE / TIME: 202501241541
Unit Type and Rh: 9500
Unit Type and Rh: 9500

## 2024-01-25 LAB — TYPE AND SCREEN
ABO/RH(D): O NEG
Antibody Screen: NEGATIVE
Unit division: 0
Unit division: 0

## 2024-01-25 LAB — CBC
HCT: 24 % — ABNORMAL LOW (ref 39.0–52.0)
Hemoglobin: 7.8 g/dL — ABNORMAL LOW (ref 13.0–17.0)
MCH: 32.1 pg (ref 26.0–34.0)
MCHC: 32.5 g/dL (ref 30.0–36.0)
MCV: 98.8 fL (ref 80.0–100.0)
Platelets: 146 10*3/uL — ABNORMAL LOW (ref 150–400)
RBC: 2.43 MIL/uL — ABNORMAL LOW (ref 4.22–5.81)
RDW: 20.9 % — ABNORMAL HIGH (ref 11.5–15.5)
WBC: 9.8 10*3/uL (ref 4.0–10.5)
nRBC: 0 % (ref 0.0–0.2)

## 2024-01-25 LAB — GLUCOSE, CAPILLARY: Glucose-Capillary: 104 mg/dL — ABNORMAL HIGH (ref 70–99)

## 2024-01-25 NOTE — Care Management Important Message (Signed)
Important Message  Patient Details  Name: [name] [name] MRN: [id number] Date of Birth: 22-Jul-1929   Important Message Given:  Yes - Medicare IM     Renie Ora 01/25/2024, 10:31 AM

## 2024-01-25 NOTE — Discharge Instructions (Signed)
[facility]  Discharge instructions  Lower Extremity Bypass Surgery  Please refer to the following instruction for your post-procedure care. Your surgeon or physician assistant will discuss any changes with you.  Activity  You are encouraged to walk as much as you can. You can slowly return to normal activities during the month after your surgery. Avoid strenuous activity and heavy lifting until your doctor tells you it's OK. Avoid activities such as vacuuming or swinging a golf club. Do not drive until your doctor give the OK and you are no longer taking prescription pain medications. It is also normal to have difficulty with sleep habits, eating and bowel movement after surgery. These will go away with time.  Bathing/Showering  You may shower after you go home. Do not soak in a bathtub, hot tub, or swim until the incision heals completely.  Incision Care  Clean your incision with mild soap and water. Shower every day. Pat the area dry with a clean towel. You do not need a bandage unless otherwise instructed. Do not apply any ointments or creams to your incision. If you have open wounds you will be instructed how to care for them or a visiting nurse may be arranged for you. If you have staples or sutures along your incision they will be removed at your post-op appointment. You may have skin glue on your incision. Do not peel it off. It will come off on its own in about one week. If you have a great deal of moisture in your groin, use a gauze help keep this area dry.  Diet  Resume your normal diet. There are no special food restrictions following this procedure. A low fat/ low cholesterol diet is recommended for all patients with vascular disease. In order to heal from your surgery, it is CRITICAL to get adequate nutrition. Your body requires vitamins, minerals, and protein. Vegetables are the best source of vitamins and minerals. Vegetables also provide the  perfect balance of protein. Processed food has little nutritional value, so try to avoid this.  Medications  Resume taking all your medications unless your doctor or nurse practitioner tells you not to. If your incision is causing pain, you may take over-the-counter pain relievers such as acetaminophen (Tylenol). If you were prescribed a stronger pain medication, please aware these medication can cause nausea and constipation. Prevent nausea by taking the medication with a snack or meal. Avoid constipation by drinking plenty of fluids and eating foods with high amount of fiber, such as fruits, vegetables, and grains. Take Colase 100 mg (an over-the-counter stool softener) twice a day as needed for constipation. Do not take Tylenol if you are taking prescription pain medications.  Follow Up  Our office will schedule a follow up appointment 2-3 weeks following discharge.  Please call us immediately for any of the following conditions  Severe or worsening pain in your legs or feet while at rest or while walking Increase pain, redness, warmth, or drainage (pus) from your incision site(s) Fever of 101 degree or higher The swelling in your leg with the bypass suddenly worsens and becomes more painful than when you were in the hospital If you have been instructed to feel your graft pulse then you should do so every day. If you can no longer feel this pulse, call the office immediately. Not all patients are given this instruction.  Leg swelling is common after leg bypass surgery.  The swelling should improve over a few months  following surgery. To improve the swelling, you may elevate your legs above the level of your heart while you are sitting or resting. Your surgeon or physician assistant may ask you to apply an ACE wrap or wear compression (TED) stockings to help to reduce swelling.  Reduce your risk of vascular disease  Stop smoking. If you would like help call [facility] at 1-800-QUIT-NOW  ((470) 654-9347) or [place] at (807)887-0074.  Manage your cholesterol Maintain a desired weight Control your diabetes weight Control your diabetes Keep your blood pressure down  If you have any questions, please call the office at [phone]

## 2024-01-25 NOTE — TOC Progression Note (Signed)
Transition of Care [facility]) - Progression Note    Patient Details  Name: MENNO VANBERGEN MRN: [id number] Date of Birth: 06-07-1929  Transition of Care [facility]) CM/SW Contact  Eduard Roux, [place] Phone Number: 01/25/2024, 11:49 AM  Clinical Narrative:     CSW spoke [place] w[place] [facility]- CSW faxed clinicals as requested to [phone]. Once they have reviewed clinicals, Ebbie Ridge will call CSW back. Per Ebbie Ridge they will need to complete as assessment, which may not be until tomorrow. CSW await on [place]'s response.  Antony Blackbird, MSW, LCSW Clinical Social Worker    Expected Discharge Plan: Assisted Living Barriers to Discharge: Continued Medical Work up  Expected Discharge Plan and Services       Living arrangements for the past 2 months: Assisted Living Facility                                       Social Determinants of Health (SDOH) Interventions SDOH Screenings   Food Insecurity: No Food Insecurity (01/23/2024)  Housing: Low Risk  (01/23/2024)  Transportation Needs: No Transportation Needs (01/23/2024)  Utilities: Not At Risk (01/23/2024)  Financial Resource Strain: Low Risk  (09/22/2023)   Received from [facility]  Social Connections: Unknown (01/22/2024)  Tobacco Use: Low Risk  (01/22/2024)    Readmission Risk Interventions     No data to display

## 2024-01-25 NOTE — NC FL2 (Cosign Needed Addendum)
[place] MEDICAID FL2 LEVEL OF CARE FORM     IDENTIFICATION  Patient Name: [name] [name] Birthdate: [birth year] Sex: male Admission Date (Current Location): 01/21/2024  [facility] [place] and [place] Number:  Producer, television/film/video and Address:  [facility] [place], [address], [place], [place] [address]      Provider Number: [id number]  Attending Physician Name and Address:  Nada Libman, MD  Relative Name and Phone Number:       Current Level of Care: Hospital Recommended Level of Care: Assisted Living Facility Prior Approval Number:    Date Approved/Denied:   PASRR Number: [id number]  Discharge Plan: ALF    Current Diagnoses: Patient Active Problem List   Diagnosis Date Noted   Critical limb ischemia of left lower extremity (HCC) 01/21/2024   Pressure injury of skin of dorsum of left foot 09/27/2023   Fall 09/26/2023   HFrEF (heart failure with reduced ejection fraction) (HCC) 09/26/2023   Syncope 09/24/2023   Chronic HFrEF (heart failure with reduced ejection fraction) (HCC) 09/04/2021   Atherosclerosis of native arteries of the extremities with ulceration (HCC) 09/04/2021   Atherosclerosis of artery of extremity with rest pain (HCC) 08/30/2021   Atherosclerosis of native arteries of extremity with intermittent claudication (HCC) 02/18/2021   Cataract cortical, senile 02/11/2021   Cor pulmonale, acute (HCC) 05/02/2020   Type 2 diabetes mellitus with peripheral angiopathy (HCC) 11/10/2018   Medicare annual wellness visit, initial 05/05/2017   Varicose veins of lower extremities with ulcer (HCC) 04/23/2017   Chronic venous insufficiency 04/23/2017   Leg pain 04/23/2017   Lymphedema 04/23/2017   A-fib (HCC) 04/23/2017   Hyperlipidemia 04/23/2017   Acquired hypothyroidism 11/03/2016   B12 deficiency 10/27/2014   CAD (coronary artery disease) 04/25/2014   GERD (gastroesophageal reflux disease) 04/25/2014   HTN (hypertension), benign 04/25/2014    S/P CABG x 3 02/26/1993    Orientation RESPIRATION BLADDER Height & Weight     Self  Normal Incontinent Weight: 135 lb 9.3 oz (61.5 kg) (per office note from 10/2023. Pt and family unsure of weight, pt unable to stand, CRNA aware) Height:  5\' 7"  (170.2 cm)  BEHAVIORAL SYMPTOMS/MOOD NEUROLOGICAL BOWEL NUTRITION STATUS      Incontinent Diet (regular)  AMBULATORY STATUS COMMUNICATION OF NEEDS Skin   Extensive Assist Verbally Surgical wounds (closed incision left leg,closed incision left ankle posterior, gry gangrene)                       Personal Care Assistance Level of Assistance  Bathing, Feeding, Dressing, Total care Bathing Assistance: Maximum assistance Feeding assistance: Limited assistance Dressing Assistance: Maximum assistance Total Care Assistance: Maximum assistance   Functional Limitations Info  Sight, Hearing, Speech Sight Info: Adequate Hearing Info: Impaired Speech Info: Adequate    SPECIAL CARE FACTORS FREQUENCY  PT (By licensed PT), OT (By licensed OT)     PT Frequency: 5x per week OT Frequency: 5x per week            Contractures Contractures Info: Not present    Additional Factors Info  Code Status, Allergies Code Status Info: FULL Allergies Info: Cephalexin,Spironolactone,Codeine,Doxycycline           Current Medications (01/25/2024):  This is the current hospital active medication list Current Facility-Administered Medications  Medication Dose Route Frequency Provider Last Rate Last Admin   0.9 %  sodium chloride infusion  10 mL/hr Intravenous Once Emilie Rutter, PA-C  0.9 %  sodium chloride infusion  500 mL Intravenous Once PRN Emilie Rutter, PA-C       acetaminophen (TYLENOL) tablet 325-650 mg  325-650 mg Oral Q4H PRN Emilie Rutter, PA-C   650 mg at 01/25/24 4098   Or   acetaminophen (TYLENOL) suppository 325-650 mg  325-650 mg Rectal Q4H PRN Emilie Rutter, PA-C       alum & mag hydroxide-simeth (MAALOX/MYLANTA)  200-200-20 MG/5ML suspension 15-30 mL  15-30 mL Oral Q2H PRN Emilie Rutter, PA-C       aspirin EC tablet 81 mg  81 mg Oral Daily Emilie Rutter, PA-C   81 mg at 01/25/24 1191   docusate sodium (COLACE) capsule 100 mg  100 mg Oral Daily Emilie Rutter, PA-C   100 mg at 01/24/24 4782   guaiFENesin-dextromethorphan (ROBITUSSIN DM) 100-10 MG/5ML syrup 15 mL  15 mL Oral Q4H PRN Emilie Rutter, PA-C       heparin injection 5,000 Units  5,000 Units Subcutaneous Q8H Emilie Rutter, PA-C   5,000 Units at 01/25/24 9562   hydrALAZINE (APRESOLINE) injection 5 mg  5 mg Intravenous Q20 Min PRN Emilie Rutter, PA-C       labetalol (NORMODYNE) injection 10 mg  10 mg Intravenous Q10 min PRN Emilie Rutter, PA-C       levothyroxine (SYNTHROID) tablet 112 mcg  112 mcg Oral Daily Emilie Rutter, PA-C   112 mcg at 01/25/24 1308   magnesium sulfate IVPB 2 g 50 mL  2 g Intravenous Daily PRN Emilie Rutter, PA-C       metoprolol succinate (TOPROL-XL) 24 hr tablet 25 mg  25 mg Oral Daily Emilie Rutter, PA-C   25 mg at 01/25/24 0950   metoprolol tartrate (LOPRESSOR) injection 2-5 mg  2-5 mg Intravenous Q2H PRN Emilie Rutter, PA-C       morphine (PF) 2 MG/ML injection 2-4 mg  2-4 mg Intravenous Q2H PRN Emilie Rutter, PA-C       ondansetron (ZOFRAN) injection 4 mg  4 mg Intravenous Q6H PRN Emilie Rutter, PA-C       oxyCODONE-acetaminophen (PERCOCET/ROXICET) 5-325 MG per tablet 1-2 tablet  1-2 tablet Oral Q4H PRN Emilie Rutter, PA-C       pantoprazole (PROTONIX) EC tablet 40 mg  40 mg Oral Daily Emilie Rutter, PA-C   40 mg at 01/25/24 0950   phenol (CHLORASEPTIC) mouth spray 1 spray  1 spray Mouth/Throat PRN Emilie Rutter, PA-C       potassium chloride SA (KLOR-CON M) CR tablet 20-40 mEq  20-40 mEq Oral Daily PRN Emilie Rutter, PA-C       simvastatin (ZOCOR) tablet 40 mg  40 mg Oral Daily Emilie Rutter, PA-C   40 mg at 01/25/24 6578     Discharge Medications: acetaminophen 325  MG tablet Commonly known as: TYLENOL Take 650 mg by mouth in the morning and at bedtime.    apixaban 2.5 MG Tabs tablet Commonly known as: Eliquis Take 1 tablet (2.5 mg total) by mouth 2 (two) times daily. What changed: when to take this    aspirin EC 81 MG tablet Take 1 tablet (81 mg total) by mouth daily. Swallow whole.    cyanocobalamin 1000 MCG tablet Commonly known as: VITAMIN B12 Take 1,000 mcg by mouth in the morning.    ferrous sulfate 325 (65 FE) MG EC tablet Take 325 mg by mouth 2 (two) times a week. Tuesday and Thursday in the morning    levothyroxine 112 MCG tablet Commonly known as: SYNTHROID Take  112 mcg by mouth daily.    metoprolol succinate 25 MG 24 hr tablet Commonly known as: TOPROL-XL Take 25 mg by mouth daily.    simvastatin 40 MG tablet Commonly known as: ZOCOR Take 40 mg by mouth daily.    torsemide 10 MG tablet Commonly known as: DEMADEX Take 10 mg by mouth in the morning.           Activity: activity as tolerated Diet: regular diet Wound Care:  Can wash left groin incision with soap and water daily then pat dry; offload left heel ulcer   Follow-up with [facility] in 2 weeks.  Relevant Imaging Results:  Relevant Lab Results:   Additional Information SS# [id number]  Eduard Roux, LCSW

## 2024-01-25 NOTE — Plan of Care (Signed)
  Problem: Clinical Measurements: Goal: Will remain free from infection Outcome: Progressing   Problem: Nutrition: Goal: Adequate nutrition will be maintained Outcome: Progressing   Problem: Coping: Goal: Level of anxiety will decrease Outcome: Progressing   Problem: Pain Managment: Goal: General experience of comfort will improve and/or be controlled Outcome: Progressing   Problem: Education: Goal: Knowledge of prescribed regimen will improve Outcome: Progressing   Problem: Skin Integrity: Goal: Risk for impaired skin integrity will decrease Outcome: Progressing

## 2024-01-25 NOTE — Progress Notes (Signed)
  Progress Note    01/25/2024 8:11 AM 3 Days Post-Op  Subjective:  no complaints   Vitals:   01/25/24 0400 01/25/24 0809  BP: 113/69 117/60  Pulse: 74 87  Resp: 20 19  Temp: 98.6 F (37 C)   SpO2: 96% 97%   Physical Exam: Lungs:  non labored Incisions:  L groin c/d/i Extremities:  L DP, PT, peroneal by doppler Neurologic: A&O  CBC    Component Value Date/Time   WBC 9.8 01/25/2024 0309   RBC 2.43 (L) 01/25/2024 0309   HGB 7.8 (L) 01/25/2024 0309   HGB 11.8 (L) 02/04/2015 1001   HCT 24.0 (L) 01/25/2024 0309   HCT 35.7 (L) 02/04/2015 1001   PLT 146 (L) 01/25/2024 0309   PLT 167 02/04/2015 1001   MCV 98.8 01/25/2024 0309   MCV 108 (H) 02/04/2015 1001   MCH 32.1 01/25/2024 0309   MCHC 32.5 01/25/2024 0309   RDW 20.9 (H) 01/25/2024 0309   RDW 20.0 (H) 02/04/2015 1001   LYMPHSABS 1.5 01/21/2024 2056   MONOABS 1.4 (H) 01/21/2024 2056   EOSABS 0.3 01/21/2024 2056   BASOSABS 0.1 01/21/2024 2056    BMET    Component Value Date/Time   NA 139 01/23/2024 0430   NA 141 02/04/2015 1001   K 4.1 01/23/2024 0430   K 4.2 02/04/2015 1001   CL 109 01/23/2024 0430   CL 106 02/04/2015 1001   CO2 23 01/23/2024 0430   CO2 30 02/04/2015 1001   GLUCOSE 123 (H) 01/23/2024 0430   GLUCOSE 122 (H) 02/04/2015 1001   BUN 17 01/23/2024 0430   BUN 19 (H) 02/04/2015 1001   CREATININE 0.98 01/23/2024 0430   CREATININE 1.26 02/04/2015 1001   CALCIUM 8.5 (L) 01/23/2024 0430   CALCIUM 8.5 02/04/2015 1001   GFRNONAA >60 01/23/2024 0430   GFRNONAA 58 (L) 02/04/2015 1001   GFRNONAA >60 11/06/2013 1304   GFRAA >60 02/05/2019 0121   GFRAA >60 02/04/2015 1001   GFRAA >60 11/06/2013 1304    INR    Component Value Date/Time   INR 1.1 02/20/2023 0810   INR 1.4 02/04/2015 1001    No intake or output data in the 24 hours ending 01/25/24 6962   Assessment/Plan:  89 y.o. male is s/p L iliofemoral endarterectomy and vein patch angioplasty with profunda bypass and laser atherectomy and  balloon angioplasty of pop/tibials  3 Days Post-Op   L LE well perfused based on doppler exam L groin incision healing well Offload L heel; prevlon boot ordered Stable H&H; resume Eliquis at discharge TOC arranging HH for assisted living facility; ok for discharge when arranged    Emilie Rutter, PA-C [facility] [phone] 01/25/2024 8:11 AM

## 2024-01-26 LAB — POCT I-STAT 7, (LYTES, BLD GAS, ICA,H+H)
Acid-base deficit: 1 mmol/L (ref 0.0–2.0)
Bicarbonate: 24.2 mmol/L (ref 20.0–28.0)
Calcium, Ion: 1.24 mmol/L (ref 1.15–1.40)
HCT: 23 % — ABNORMAL LOW (ref 39.0–52.0)
Hemoglobin: 7.8 g/dL — ABNORMAL LOW (ref 13.0–17.0)
O2 Saturation: 100 %
Potassium: 3.9 mmol/L (ref 3.5–5.1)
Sodium: 142 mmol/L (ref 135–145)
TCO2: 25 mmol/L (ref 22–32)
pCO2 arterial: 41.9 mm[Hg] (ref 32–48)
pH, Arterial: 7.369 (ref 7.35–7.45)
pO2, Arterial: 213 mm[Hg] — ABNORMAL HIGH (ref 83–108)

## 2024-01-26 LAB — CBC
HCT: 23.9 % — ABNORMAL LOW (ref 39.0–52.0)
Hemoglobin: 7.9 g/dL — ABNORMAL LOW (ref 13.0–17.0)
MCH: 32.2 pg (ref 26.0–34.0)
MCHC: 33.1 g/dL (ref 30.0–36.0)
MCV: 97.6 fL (ref 80.0–100.0)
Platelets: 200 10*3/uL (ref 150–400)
RBC: 2.45 MIL/uL — ABNORMAL LOW (ref 4.22–5.81)
RDW: 20.5 % — ABNORMAL HIGH (ref 11.5–15.5)
WBC: 7.2 10*3/uL (ref 4.0–10.5)
nRBC: 0 % (ref 0.0–0.2)

## 2024-01-26 LAB — POCT ACTIVATED CLOTTING TIME
Activated Clotting Time: 199 s
Activated Clotting Time: 210 s
Activated Clotting Time: 233 s
Activated Clotting Time: 210 s
Activated Clotting Time: 222 s

## 2024-01-26 MED ORDER — APIXABAN 2.5 MG PO TABS: 2.5000 mg | ORAL_TABLET | Freq: Two times a day (BID) | ORAL | 1 refills | Status: DC

## 2024-01-26 MED ORDER — APIXABAN 2.5 MG PO TABS
2.5000 mg | ORAL_TABLET | Freq: Two times a day (BID) | ORAL | Status: DC
Start: 2024-01-26 — End: 2024-01-26
  Administered 2024-01-26: 2.5 mg via ORAL
  Filled 2024-01-26: qty 1

## 2024-01-26 MED ORDER — ASPIRIN 81 MG PO TBEC: 81.0000 mg | DELAYED_RELEASE_TABLET | Freq: Every day | ORAL | Status: DC

## 2024-01-26 NOTE — Discharge Summary (Signed)
Discharge Summary  Patient ID: [name] [name] [id number] 89 y.o. 10-25-1929  Admit date: 01/21/2024  Discharge date and time: 01/26/24   Admitting Physician: Nada Libman, MD   Discharge Physician: same  Admission Diagnoses: Critical limb ischemia of left lower extremity [facility]) [I70.222]  Discharge Diagnoses: same  Admission Condition: fair  Discharged Condition: fair  Indication for Admission: Postoperative care  Hospital Course: [name] [name] is a 89 year old male with critical limb ischemia of the left lower extremity due to left heel ulceration.  He was initially scheduled for surgery at [facility] however was declined by [facility].  For this reason he was scheduled for surgery with Dr. Myra Gianotti at [facility].  He was brought in as an outpatient on 01/22/2024 and underwent right femoral endarterectomy with vein patch angioplasty, right profunda bypass and left leg arteriogram with laser atherectomy of the left popliteal and TP trunk with drug-coated balloon angioplasty of the left popliteal artery by Dr. Myra Gianotti on 01/22/2024.  He tolerated procedure well and was admitted to the hospital postoperatively.  Hospital course consisted of increasing mobility.  Throughout his hospital stay left groin incision appeared to be healing well.  He also maintained a brisk left DP signal by Doppler.  He was evaluated by therapy teams and recommended inpatient rehabilitation versus skilled nursing facility.  This was discussed with patient's family who climbed rehab and skilled nursing facility.  They believe rehabilitation can occur at his assisted living facility in conjunction with home health physical therapy.  This was arranged by the transition of care team.  He will need to offload his left heel while in bed to avoid worsening left heel ulceration.  Left groin incision care will involve cleansing with soap and water on a daily basis.  He will follow-up in  the office in about 2 weeks.  Home Eliquis was restarted at the time of discharge.  81 mg of aspirin was also added to his daily regimen.  He was discharged back to his assisted living facility in stable condition.  Consults: None  Treatments: surgery: Right femoral endarterectomy with vein patch angioplasty, right profunda bypass and left leg arteriogram with laser atherectomy of the left popliteal and TP trunk with drug-coated balloon angioplasty of the left popliteal artery by Dr. Myra Gianotti on 01/22/2024.  Discharge Exam: See progress note 01/26/24 Vitals:   01/26/24 0751 01/26/24 1241  BP: (!) 140/69 122/61  Pulse: 83 75  Resp: 18 17  Temp: 97.9 F (36.6 C) (!) 97.4 F (36.3 C)  SpO2: 100%      Disposition: Discharge disposition: 01-Home or Self Care       Patient Instructions:  Allergies as of 01/26/2024       Reactions   Cephalexin Rash   Spironolactone Nausea Only   Codeine Rash, Other (See Comments)   Can not sleep   Doxycycline Rash        Medication List     TAKE these medications    acetaminophen 325 MG tablet Commonly known as: TYLENOL Take 650 mg by mouth in the morning and at bedtime.   apixaban 2.5 MG Tabs tablet Commonly known as: Eliquis Take 1 tablet (2.5 mg total) by mouth 2 (two) times daily. What changed: when to take this   aspirin EC 81 MG tablet Take 1 tablet (81 mg total) by mouth daily. Swallow whole.   cyanocobalamin 1000 MCG tablet Commonly known as: VITAMIN B12 Take 1,000 mcg by mouth in the morning.  ferrous sulfate 325 (65 FE) MG EC tablet Take 325 mg by mouth 2 (two) times a week. Tuesday and Thursday in the morning   levothyroxine 112 MCG tablet Commonly known as: SYNTHROID Take 112 mcg by mouth daily.   metoprolol succinate 25 MG 24 hr tablet Commonly known as: TOPROL-XL Take 25 mg by mouth daily.   simvastatin 40 MG tablet Commonly known as: ZOCOR Take 40 mg by mouth daily.   torsemide 10 MG tablet Commonly  known as: DEMADEX Take 10 mg by mouth in the morning.       Activity: activity as tolerated Diet: regular diet Wound Care:  Can wash left groin incision with soap and water daily then pat dry; offload left heel ulcer  Follow-up with [facility] in 2 weeks.  Signed: Emilie Rutter, PA-C 01/26/2024 1:20 PM [facility] Office: [phone]

## 2024-01-26 NOTE — Progress Notes (Addendum)
  Progress Note    01/26/2024 7:49 AM 4 Days Post-Op  Subjective:  no complaints   Vitals:   01/25/24 2330 01/26/24 0258  BP: 133/66 126/62  Pulse: 83 80  Resp: 20 16  Temp: 97.6 F (36.4 C) 98.2 F (36.8 C)  SpO2: 97% 100%   Physical Exam: Lungs:  non labored Incisions:  L groin c/d/i Extremities:  brisk L DP by doppler Neurologic: A&O  CBC    Component Value Date/Time   WBC 9.8 01/25/2024 0309   RBC 2.43 (L) 01/25/2024 0309   HGB 7.8 (L) 01/25/2024 0309   HGB 11.8 (L) 02/04/2015 1001   HCT 24.0 (L) 01/25/2024 0309   HCT 35.7 (L) 02/04/2015 1001   PLT 146 (L) 01/25/2024 0309   PLT 167 02/04/2015 1001   MCV 98.8 01/25/2024 0309   MCV 108 (H) 02/04/2015 1001   MCH 32.1 01/25/2024 0309   MCHC 32.5 01/25/2024 0309   RDW 20.9 (H) 01/25/2024 0309   RDW 20.0 (H) 02/04/2015 1001   LYMPHSABS 1.5 01/21/2024 2056   MONOABS 1.4 (H) 01/21/2024 2056   EOSABS 0.3 01/21/2024 2056   BASOSABS 0.1 01/21/2024 2056    BMET    Component Value Date/Time   NA 139 01/23/2024 0430   NA 141 02/04/2015 1001   K 4.1 01/23/2024 0430   K 4.2 02/04/2015 1001   CL 109 01/23/2024 0430   CL 106 02/04/2015 1001   CO2 23 01/23/2024 0430   CO2 30 02/04/2015 1001   GLUCOSE 123 (H) 01/23/2024 0430   GLUCOSE 122 (H) 02/04/2015 1001   BUN 17 01/23/2024 0430   BUN 19 (H) 02/04/2015 1001   CREATININE 0.98 01/23/2024 0430   CREATININE 1.26 02/04/2015 1001   CALCIUM 8.5 (L) 01/23/2024 0430   CALCIUM 8.5 02/04/2015 1001   GFRNONAA >60 01/23/2024 0430   GFRNONAA 58 (L) 02/04/2015 1001   GFRNONAA >60 11/06/2013 1304   GFRAA >60 02/05/2019 0121   GFRAA >60 02/04/2015 1001   GFRAA >60 11/06/2013 1304    INR    Component Value Date/Time   INR 1.1 02/20/2023 0810   INR 1.4 02/04/2015 1001     Intake/Output Summary (Last 24 hours) at 01/26/2024 0749 Last data filed at 01/25/2024 1806 Gross per 24 hour  Intake 720 ml  Output 800 ml  Net -80 ml     Assessment/Plan:  89 y.o. male  is s/p  L iliofemoral endarterectomy and vein patch angioplasty with profunda bypass and laser atherectomy and balloon angioplasty of pop/tibials   4 Days Post-Op   L well perfused based on doppler exam Prevlon boot ordered yesterday but not yet in room; nursing staff will look into this today Resume Eliquis D/c back to assisted living when arranged; [facility] working on arranging [facility] PT   Emilie Rutter, PA-C [facility] [phone] 01/26/2024 7:49 AM   Plan for discharge today  Durene Cal

## 2024-01-26 NOTE — TOC Progression Note (Signed)
Transition of Care [facility]) - Progression Note    Patient Details  Name: [name] [name] MRN: [id number] Date of Birth: 08-28-1929  [facility]) CM/SW Contact  Eduard Roux, [place] Phone Number: 01/26/2024, 10:21 AM  Clinical Narrative:     CSW re-sent clinicals to [facility] for review   Expected Discharge Plan: Assisted Living Barriers to Discharge: Continued Medical Work up  Expected Discharge Plan and Services       Living arrangements for the past 2 months: Assisted Living Facility                                       Social Determinants of Health (SDOH) Interventions SDOH Screenings   Food Insecurity: No Food Insecurity (01/23/2024)  Housing: Low Risk  (01/23/2024)  Transportation Needs: No Transportation Needs (01/23/2024)  Utilities: Not At Risk (01/23/2024)  Financial Resource Strain: Low Risk  (09/22/2023)   Received [facility]  Social Connections: Unknown (01/22/2024)  Tobacco Use: Low Risk  (01/22/2024)    Readmission Risk Interventions     No data to display

## 2024-01-26 NOTE — TOC Transition Note (Signed)
[facility]) - Discharge Note   Patient Details  Name: [name] [name] MRN: [id number] Date of Birth: Jun 12, 1929  Transition of Care [facility]) CM/SW Contact:  Eduard Roux, LCSW Phone Number: 01/26/2024, 12:36 PM   Clinical Narrative:     Patient will Discharge to: [facility] Discharge Date: 01/26/2024 Family Notified: daughter Transport By: family/car  Per MD patient is ready for discharge. RN, patient, and facility notified of discharge. Discharge Summary sent to facility. RN given number for [phone],ask for [place].   Clinical Social Worker signing off.  Antony Blackbird, MSW, LCSW Clinical Social Worker    Final next level of care: Assisted Living Barriers to Discharge: Barriers Resolved   Patient Goals and CMS Choice Patient states their goals for this hospitalization and ongoing recovery are:: Daughter [name] states to return to ALF CMS Medicare.gov Compare Post Acute Care list provided to:: Patient Choice offered to / list presented to : Patient      Discharge Placement              Patient chooses bed at:  ([facility]) Patient to be transferred to facility by: family Name of family member notified: daughter Patient and family notified of of transfer: 01/26/24  Discharge Plan and Services Additional resources added to the After Visit Summary for                                       Social Drivers of Health (SDOH) Interventions SDOH Screenings   Food Insecurity: No Food Insecurity (01/23/2024)  Housing: Low Risk  (01/23/2024)  Transportation Needs: No Transportation Needs (01/23/2024)  Utilities: Not At Risk (01/23/2024)  Financial Resource Strain: Low Risk  (09/22/2023)   Received from [facility]  Social Connections: Unknown (01/22/2024)  Tobacco Use: Low Risk  (01/22/2024)     Readmission Risk Interventions     No data to display

## 2024-01-26 NOTE — Progress Notes (Signed)
Physical Therapy Treatment Patient Details Name: [name] [name] MRN: [id number] DOB: Dec 13, 1929 Today's Date: 01/26/2024   History of Present Illness Pt is 89 yo presenting from [facility] for direction admission to [facility] on 01/21/24 for L femoral endarterectomy with popliteal artery stenting. Pt s/p surgery on 01/22/24. PMH: A fib on eliquis, MI s/p CABG, ischemic cardiomyopathy, HFEF, Hypotension, Hypothyroidism, cx of head/neck.    PT Comments  Pt received in bed, awake and signing, not oriented to place or time. Due to tightness L knee pt tends to keep L knee bent up in bed with pressure through left heel. Pt with dressing to L heel but staff at ALF will need to watch his positioning. Pt able to come to EOB with mod A to scoot hips. Pt still avoidant of L dorsiflexion due to discomfort LLE. L post op shoe and R shoe donned. Pt stood to stedy 2x with mod A +2 and then a 3rd time with max A +2 due to fatigue.  Pt worked on increasing standing tolerance and pregait activities in stedy. Sounds like he spends most of his time in electric scooter though. Recommend return to ALF with HHPT.    If plan is discharge home, recommend the following: Two people to help with walking and/or transfers;Assistance with cooking/housework;Assist for transportation;Help with stairs or ramp for entrance   Can travel by private vehicle     No  Equipment Recommendations  None recommended by PT    Recommendations for Other Services       Precautions / Restrictions Precautions Precautions: Fall Restrictions Weight Bearing Restrictions Per Provider Order: No     Mobility  Bed Mobility Overal bed mobility: Needs Assistance Bed Mobility: Supine to Sit     Supine to sit: HOB elevated, Mod assist     General bed mobility comments: pt needed mod A to scoot hips to EOB    Transfers Overall transfer level: Needs assistance Equipment used: Ambulation equipment used Transfers: Sit  to/from Stand, Bed to chair/wheelchair/BSC Sit to Stand: Max assist, +2 safety/equipment, Via lift equipment Stand pivot transfers: Total assist, +2 physical assistance, +2 safety/equipment         General transfer comment: L boot and R shoe donned. Pt L knee extension very limited. He did put wt through LLE but not using it to push into standing. Mod A +2 for power up from bed 2x, then max A +2 third time. After pt fatigued, needed max A +2 to stand even from flaps of stedy. Transfer via Lift Equipment: Stedy  Ambulation/Gait             Pre-gait activities: worked on wt shifting in standing as well as increasing time standing statically. Could not achieve more than 10 secs at a time     Optometrist     Tilt Bed    Modified Rankin (Stroke Patients Only)       Balance Overall balance assessment: Needs assistance Sitting-balance support: Bilateral upper extremity supported, Feet unsupported Sitting balance-Leahy Scale: Good     Standing balance support: During functional activity, Reliant on assistive device for balance, Bilateral upper extremity supported Standing balance-Leahy Scale: Poor Standing balance comment: Pt requires external support at Max A to maintain balance  Cognition Arousal: Alert Behavior During Therapy: WFL for tasks assessed/performed Overall Cognitive Status: Impaired/Different from baseline Area of Impairment: Orientation, Problem solving                 Orientation Level: Disoriented to, Place, Time           Problem Solving: Slow processing, Difficulty sequencing, Requires verbal cues, Requires tactile cues General Comments: pt unable to state where he is or the day/ date. Cognition improved with mobility. Needed tactile cues for sequencing        Exercises General Exercises - Lower Extremity Ankle Circles/Pumps: AROM, Both, 20 reps, Seated Long Arc  Quad: AROM, Both, 10 reps, Seated Hip Flexion/Marching: AROM, Both, 10 reps, Seated    General Comments General comments (skin integrity, edema, etc.): pt relays that he uses an electric scooter for mobility at ALF so expect L knee was already tight PTA.      Pertinent Vitals/Pain Pain Assessment Pain Assessment: Faces Faces Pain Scale: Hurts little more Pain Location: L foot in WB Pain Descriptors / Indicators: Aching, Burning Pain Intervention(s): Monitored during session, Limited activity within patient's tolerance    Home Living                          Prior Function            PT Goals (current goals can now be found in the care plan section) Acute Rehab PT Goals Patient Stated Goal: To return to ALF with a higher level of care. PT Goal Formulation: With patient/family Time For Goal Achievement: 02/06/24 Potential to Achieve Goals: Fair Progress towards PT goals: Progressing toward goals    Frequency    Min 1X/week      PT Plan      Co-evaluation              AM-PAC PT "6 Clicks" Mobility   Outcome Measure  Help needed turning from your back to your side while in a flat bed without using bedrails?: A Little Help needed moving from lying on your back to sitting on the side of a flat bed without using bedrails?: A Little Help needed moving to and from a bed to a chair (including a wheelchair)?: A Lot Help needed standing up from a chair using your arms (e.g., wheelchair or bedside chair)?: A Lot Help needed to walk in hospital room?: Total Help needed climbing 3-5 steps with a railing? : Total 6 Click Score: 12    End of Session Equipment Utilized During Treatment: Gait belt Activity Tolerance: Patient tolerated treatment well Patient left: in chair;with chair alarm set;with call bell/phone within reach Nurse Communication: Mobility status PT Visit Diagnosis: Other abnormalities of gait and mobility (R26.89);Unsteadiness on feet  (R26.81);Pain Pain - Right/Left: Left Pain - part of body: Ankle and joints of foot     Time: 1610-9604 PT Time Calculation (min) (ACUTE ONLY): 25 min  Charges:    $Therapeutic Exercise: 8-22 mins $Therapeutic Activity: 8-22 mins PT General Charges $$ ACUTE PT VISIT: 1 Visit                     Lyanne Co, PT  Acute Rehab Services Secure chat preferred Office [phone]    Lawana Chambers Cheyenne Bordeaux 01/26/2024, 1:39 PM

## 2024-02-08 ENCOUNTER — Ambulatory Visit
Admission: RE | Admit: 2024-02-08 | Discharge: 2024-02-08 | Disposition: A | Discharge status: Home / Self Care | Payer: PPO | Source: Ambulatory Visit | Attending: Family Medicine | Admitting: Family Medicine

## 2024-02-08 ENCOUNTER — Other Ambulatory Visit: Payer: Self-pay | Admitting: Family Medicine

## 2024-02-08 DIAGNOSIS — L0390 Cellulitis, unspecified: Secondary | ICD-10-CM | POA: Diagnosis not present

## 2024-02-08 DIAGNOSIS — R600 Localized edema: Secondary | ICD-10-CM | POA: Diagnosis not present

## 2024-02-08 DIAGNOSIS — I739 Peripheral vascular disease, unspecified: Secondary | ICD-10-CM | POA: Diagnosis not present

## 2024-02-08 DIAGNOSIS — C4492 Squamous cell carcinoma of skin, unspecified: Secondary | ICD-10-CM | POA: Diagnosis not present

## 2024-02-09 ENCOUNTER — Inpatient Hospital Stay: Payer: PPO | Attending: Internal Medicine | Admitting: Internal Medicine

## 2024-02-09 ENCOUNTER — Encounter: Payer: Self-pay | Admitting: Internal Medicine

## 2024-02-09 ENCOUNTER — Inpatient Hospital Stay: Payer: PPO

## 2024-02-09 VITALS — BP 115/48 | HR 64 | Temp 98.8°F | Ht 67.0 in | Wt 140.0 lb

## 2024-02-09 DIAGNOSIS — D0439 Carcinoma in situ of skin of other parts of face: Secondary | ICD-10-CM | POA: Diagnosis not present

## 2024-02-09 DIAGNOSIS — C4442 Squamous cell carcinoma of skin of scalp and neck: Secondary | ICD-10-CM | POA: Diagnosis not present

## 2024-02-09 DIAGNOSIS — C770 Secondary and unspecified malignant neoplasm of lymph nodes of head, face and neck: Secondary | ICD-10-CM | POA: Diagnosis not present

## 2024-02-09 DIAGNOSIS — S0180XA Unspecified open wound of other part of head, initial encounter: Secondary | ICD-10-CM

## 2024-02-09 DIAGNOSIS — I509 Heart failure, unspecified: Secondary | ICD-10-CM | POA: Insufficient documentation

## 2024-02-09 DIAGNOSIS — Z79620 Long term (current) use of immunosuppressive biologic: Secondary | ICD-10-CM | POA: Diagnosis not present

## 2024-02-09 DIAGNOSIS — D649 Anemia, unspecified: Secondary | ICD-10-CM

## 2024-02-09 DIAGNOSIS — Z5112 Encounter for antineoplastic immunotherapy: Secondary | ICD-10-CM | POA: Insufficient documentation

## 2024-02-09 DIAGNOSIS — I4891 Unspecified atrial fibrillation: Secondary | ICD-10-CM | POA: Diagnosis not present

## 2024-02-09 LAB — CBC WITH DIFFERENTIAL (CANCER CENTER ONLY)
Abs Immature Granulocytes: 0.07 10*3/uL (ref 0.00–0.07)
Basophils Absolute: 0.1 10*3/uL (ref 0.0–0.1)
Basophils Relative: 1 %
Eosinophils Absolute: 0.3 10*3/uL (ref 0.0–0.5)
Eosinophils Relative: 4 %
HCT: 25.6 % — ABNORMAL LOW (ref 39.0–52.0)
Hemoglobin: 8.1 g/dL — ABNORMAL LOW (ref 13.0–17.0)
Immature Granulocytes: 1 %
Lymphocytes Relative: 17 %
Lymphs Abs: 1.6 10*3/uL (ref 0.7–4.0)
MCH: 31.4 pg (ref 26.0–34.0)
MCHC: 31.6 g/dL (ref 30.0–36.0)
MCV: 99.2 fL (ref 80.0–100.0)
Monocytes Absolute: 1.3 10*3/uL — ABNORMAL HIGH (ref 0.1–1.0)
Monocytes Relative: 13 %
Neutro Abs: 6.2 10*3/uL (ref 1.7–7.7)
Neutrophils Relative %: 64 %
Platelet Count: 299 10*3/uL (ref 150–400)
RBC: 2.58 MIL/uL — ABNORMAL LOW (ref 4.22–5.81)
RDW: 21.2 % — ABNORMAL HIGH (ref 11.5–15.5)
WBC Count: 9.6 10*3/uL (ref 4.0–10.5)
nRBC: 0 % (ref 0.0–0.2)

## 2024-02-09 LAB — CMP (CANCER CENTER ONLY)
ALT: 9 U/L (ref 0–44)
AST: 21 U/L (ref 15–41)
Albumin: 3.4 g/dL — ABNORMAL LOW (ref 3.5–5.0)
Alkaline Phosphatase: 58 U/L (ref 38–126)
Anion gap: 9 (ref 5–15)
BUN: 19 mg/dL (ref 8–23)
CO2: 25 mmol/L (ref 22–32)
Calcium: 8.5 mg/dL — ABNORMAL LOW (ref 8.9–10.3)
Chloride: 104 mmol/L (ref 98–111)
Creatinine: 1.21 mg/dL (ref 0.61–1.24)
GFR, Estimated: 56 mL/min — ABNORMAL LOW (ref >60)
Glucose, Bld: 119 mg/dL — ABNORMAL HIGH (ref 70–99)
Potassium: 3.8 mmol/L (ref 3.5–5.1)
Sodium: 138 mmol/L (ref 135–145)
Total Bilirubin: 0.8 mg/dL (ref 0.0–1.2)
Total Protein: 7 g/dL (ref 6.5–8.1)

## 2024-02-09 LAB — IRON AND TIBC
Iron: 250 ug/dL — ABNORMAL HIGH (ref 45–182)
Saturation Ratios: 80 % — ABNORMAL HIGH (ref 17.9–39.5)
TIBC: 314 ug/dL (ref 250–450)
UIBC: 64 ug/dL

## 2024-02-09 LAB — LACTATE DEHYDROGENASE: LDH: 181 U/L (ref 98–192)

## 2024-02-09 LAB — FERRITIN: Ferritin: 90 ng/mL (ref 24–336)

## 2024-02-09 NOTE — Assessment & Plan Note (Addendum)
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 S/p SBRT 30 Gray in 5 fractions.   # January 2025- [Dr.Dasher] Repeat biopsy invasive squamous cell carcinoma moderately.  # Reviewed the options of treatment including # reirradiation # Immunotherapy.  # Patient and family declined radiation given the previous significant side effects.  Alternatively I recommend immunotherapy.  # I discussed the mechanism of action; The goal of therapy is palliative; and length of treatments are likely ongoing/based upon the results of the scans. Discussed the potential side effects of immunotherapy including but not limited to diarrhea; skin rash; elevated LFTs/endocrine abnormalities etc. given his age-temporality recommend holding off any PET scan at this time.  # Malignant ulcer-foul-smelling no signs of superinfection at this time.  Will refer to wound care.  # Severe Anemia-question etiology hemoglobin 7-8.  Check iron studies ferritin.  Consider iron infusions.  # Right femoral endarterectomy with vein patch angioplasty, right profunda bypass and left leg arteriogram with laser atherectomy of the left popliteal and TP trunk with drug-coated balloon angioplasty of the left popliteal artery by Dr. Myra Gianotti on 01/22/2024.   # Cardiovascular- CHF/A.fib [ Dr.Parashoes] PVD-angioplasty of the left popliteal artery in Jan 2025- on elqiuis.  Clinically stable.  Thank you Dr.Dasher for allowing me to participate in the care of your pleasant patient. Please do not hesitate to contact me with questions or concerns in the interim.  # DISPOSITION: # wound care referral- re: neck ulcer.  # labs today- cbc/cmp; thyroid profile; iron studies; ferritin; LDH- please order # referral to Josh re: palliative care # Follow up in 1 week- MD; no labs-  chemo; possible venofer-Dr.B

## 2024-02-09 NOTE — Progress Notes (Signed)
C/o pain lt foot 4/10, recent surgery.  C/o wound rt face, does have a smell.  Daughter was Donnelly Stager.

## 2024-02-09 NOTE — Progress Notes (Signed)
START ON PATHWAY REGIMEN - Melanoma and Other Skin Cancers     A cycle is every 21 days:     [id number]-[id number]   **Always confirm dose/schedule in your pharmacy ordering system**  Patient Characteristics: Cutaneous Squamous Cell Carcinoma, Locally Advanced - Unresectable, Systemic Therapy Indicated Disease Classification: Cutaneous Squamous Cell Carcinoma Therapeutic Status: Locally Advanced - Unresectable Click here if multiple primary tumors are present.: false Intent of Therapy: Non-Curative / Palliative Intent, Discussed with Patient

## 2024-02-09 NOTE — Progress Notes (Signed)
[facility] CONSULT NOTE  Patient Care Team: [name], MD as PCP - General (Internal Medicine) Earna Coder, MD as Consulting Physician (Oncology)  CHIEF COMPLAINTS/PURPOSE OF CONSULTATION: SCC     JULY 2024- 1. 11 mm subcutaneous nodule in the right neck near the mandibular angle is hypermetabolic. 2. No other suspicious hypermetabolism in the neck, chest, abdomen, or pelvis. 3. Cholelithiasis. 4. Left groin hernia contains a segment of sigmoid colon without complicating features. 5.  Aortic Atherosclerosis (ICD10-I70.  JULY 2024-- Lymph node, biopsy, Right neck. Submandibular mass - METASTATIC SQUAMOUS CELL CARCINOMA  # 2023 for squamous cell carcinoma of the skin with adenopathy in the right neck- s/p RT; recurrence-s/p LB Bx- JULY 2024-A nodule in the right subdigastric region which was biopsy positive again for squamous cell carcinoma. S/p SBRT 30 Gray in 5 fractions.   # January 2025- [Dr.Dasher] Repeat biopsy invasive squamous cell carcinoma moderately- FEB 18th, 2025- START CEPI  HISTORY OF PRESENTING ILLNESS: Patient ambulating-in wheel chair. Accompanied by son in law.   [name] [name] 89 y.o.  male pleasant patient with Hx of SCC has been referred close for further evaluation recommendations.  Patient was seen by dermatology for skin lesion located to right central mandibular cheek.  The lesion was not bleeding and enlarging.  January 2025-  Repeat biopsy invasive squamous cell carcinoma moderately stated  Patient had been treated for this in the past with exception of urinalysis at [facility] in 2023.  Post local recurrence patient was treated with radiation in 2024.  treated back in 2023 for squamous cell carcinoma of the skin with adenopathy in the right neck. He developed a nodule in the right subdigastric region which was biopsy positive again for squamous cell carcinoma. We treated that with SBRT 30 Gray in 5 fractions. Initially had a  complete response although recently according to daughter areas have been progressing is oozing somewhat.   Patient has been referred to [place] for further consideration of immunotherapy.   Review of Systems  Constitutional:  Positive for malaise/fatigue and weight loss. Negative for chills, diaphoresis and fever.  HENT:  Negative for nosebleeds and sore throat.   Eyes:  Negative for double vision.  Respiratory:  Negative for cough, hemoptysis, sputum production, shortness of breath and wheezing.   Cardiovascular:  Negative for chest pain, palpitations, orthopnea and leg swelling.  Gastrointestinal:  Negative for abdominal pain, blood in stool, constipation, diarrhea, heartburn, melena, nausea and vomiting.  Genitourinary:  Negative for dysuria, frequency and urgency.  Musculoskeletal:  Positive for back pain and joint pain.  Skin: Negative.  Negative for itching and rash.  Neurological:  Negative for dizziness, tingling, focal weakness, weakness and headaches.  Endo/Heme/Allergies:  Does not bruise/bleed easily.  Psychiatric/Behavioral:  Negative for depression. The patient is not nervous/anxious and does not have insomnia.     MEDICAL HISTORY:  Past Medical History:  Diagnosis Date   A-fib [facility])    a.) CHA2DS2VASc = 5 (age x 2, CHF, HTN, previous MI/vascular disease history);  b.) s/p DCCV 03/05/1993; c.) rate/rhythm maintained on oral metoprolol succinate; chronically anticoagulated with dose reduced apixaban   Aortic atherosclerosis (HCC)    B12 deficiency    CHF (congestive heart failure) (HCC)    a.) TTE 11/05/12: EF 25-35, dist sep AK, mild MR; b.) TTE 05/10/14: EF 30, mild LVH, mod BAE, mild AR/PR, mod MR/TR; c.) TTE 03/12/20: EF 20, sev glob HK, mod BAE, mild LAE, mod RAE, triv PR, mild AR,  sev MR/TR; d.) TTE 08/21/21: EF 30, sev glob HK, apical dyskinesis, mild LVH, mod BAE, triv AR/PR, mod MR, sev TR; e.) TTE 09/25/23: EF 35-40,  diff AK/HK, G2DD, sev BAE, RVE, RVSP 66.3, mild MR, AoV  calc   Cor pulmonale (HCC)    Coronary artery disease 02/21/1993   a.) LHC 02/21/1993 : 95% pLAD, 75% OM1, 50% OM3, 25-50% pRCA, 50% large anterolateral lesion --> transfer to [facility] for CABG; b.) s/p 2v CABG 02/26/1993 (LIMA-LAD, SVG-OM1)   Family history of adverse reaction to anesthesia    a.) PONV in 1st degree relative (daughter)   GERD (gastroesophageal reflux disease)    History of [place] spotted fever    HLD (hyperlipidemia)    HTN (hypertension), benign 04/25/2014   Hypothyroidism    Iron deficiency anemia    On apixaban therapy    Posterolateral myocardial infarction (HCC) 02/21/1993   LHC 02/21/1993 : 95% pLAD, 75% OM1, 50% OM3, 25-50% pRCA, 50% large anterolateral lesion --> transfer to [facility] for [facility]G; b.) s/p 2v CABG 02/26/1993 (LIMA-LAD, SVG-OM1)   Pulmonary HTN (HCC)    a.) TTE 05/10/2014: RVSP 48.6; b.) TTE 03/12/2020: RVSP 73.3; c.) TTE  09/25/2023: RVSP 66.3   PVD (peripheral vascular disease) (HCC)    S/P CABG x 2 02/26/1993   a.) LIMA-LAD, SVG-OM1 --> complicated by atrial flutter on POD4 --> Tx'd with digoxin + procainamide with no resolution. POD5 diltiazem gtt added with no improvement. Ultimately required DCCV on 03/05/1993.   Squamous cell carcinoma of skin 11/18/2021   right ear and mandible, ED[facility]   Squamous cell carcinoma of skin 03/18/2022   right mandible and ear/refer for Mohs/ Dr. Adriana Simas has referred pt to Dr. Nedra Hai in otolaryngology excised by Dr. Nedra Hai 05/03/22, Radiation with Dr. Rushie Chestnut   Type 2 diabetes mellitus with peripheral angiopathy (HCC) 11/10/2018    SURGICAL HISTORY: Past Surgical History:  Procedure Laterality Date   ANGIOPLASTY Left 01/22/2024   Procedure: BALLOON ANGIOPLASTY OF TIBIAL PERONEAL TRUNK AND POSTERIOR TBIAL ARTERY AND DRUG COATED BALLOON ANGIOPLASTY OF POPLITEAL ARTERY;  Surgeon: Nada Libman, MD;  Location: [facility] OR;  Service: Vascular;  Laterality: Left;   cancer removal   2023   below the right base of ear at [facility]    COLONOSCOPY     CORONARY ARTERY BYPASS GRAFT N/A 02/26/1993   Procedure: CORONARY ARTERY BYPASS GRAFT; Location: [facility]; Surgeon: Marshell Garfinkel, MD   ENDARTERECTOMY FEMORAL Left 01/22/2024   Procedure: LEFT COMMON FEMORAL ENDARTERECTOMY WITH VEIN PATCH;  Surgeon: Nada Libman, MD;  Location: [facility] OR;  Service: Vascular;  Laterality: Left;   FEMORAL-FEMORAL BYPASS GRAFT Left 01/22/2024   Procedure: BYPASS GRAFT LEFT COMMON FEMORAL TO PROFUNDA  ARTERY USING DACRON GRAFT;  Surgeon: Nada Libman, MD;  Location: [facility] OR;  Service: Vascular;  Laterality: Left;   HERNIA REPAIR     LOWER EXTREMITY ANGIOGRAPHY Left 08/30/2021   Procedure: LOWER EXTREMITY ANGIOGRAPHY;  Surgeon: Renford Dills, MD;  Location: [facility] INVASIVE CV LAB;  Service: Cardiovascular;  Laterality: Left;   LOWER EXTREMITY ANGIOGRAPHY Left 11/10/2023   Procedure: Lower Extremity Angiography;  Surgeon: Renford Dills, MD;  Location: [facility] INVASIVE CV LAB;  Service: Cardiovascular;  Laterality: Left;    SOCIAL HISTORY: Social History   Socioeconomic History   Marital status: Widowed    Spouse name: Not on file   Number of children: 2   Years of education: Not on file   Highest education level: Not on file  Occupational History   Not on file  Tobacco Use   Smoking status: Never   Smokeless tobacco: Never  Vaping Use   Vaping status: Never Used  Substance and Sexual Activity   Alcohol use: No   Drug use: No   Sexual activity: Not on file  Other Topics Concern   Not on file  Social History Narrative   Lives by himself: daughter penny lives in [place]. Daughter [name] lives in town   Social Drivers of Health   Financial Resource Strain: Low Risk  (09/22/2023)   Received from [facility]   Overall Financial Resource Strain (CARDIA)    Difficulty of Paying Living Expenses: Not hard at all  Food Insecurity: No Food Insecurity (02/09/2024)   Hunger Vital Sign    Worried About Running Out of Food  in the Last Year: Never true    Ran Out of Food in the Last Year: Never true  Transportation Needs: No Transportation Needs (02/09/2024)   PRAPARE - Administrator, Civil Service (Medical): No    Lack of Transportation (Non-Medical): No  Physical Activity: Not on file  Stress: Not on file  Social Connections: Unknown (01/22/2024)   Social Connection and Isolation Panel [NHANES]    Frequency of Communication with Friends and Family: Patient unable to answer    Frequency of Social Gatherings with Friends and Family: Patient unable to answer    Attends Religious Services: Not on file    Active Member of Clubs or Organizations: Not on file    Attends Banker Meetings: Not on file    Marital Status: Patient unable to answer  Intimate Partner Violence: Not At Risk (02/09/2024)   Humiliation, Afraid, Rape, and Kick questionnaire    Fear of Current or Ex-Partner: No    Emotionally Abused: No    Physically Abused: No    Sexually Abused: No    FAMILY HISTORY: Family History  Problem Relation Age of Onset   Hypertension Mother    Diabetes Brother    Stroke Brother    Heart attack Brother    Esophageal cancer Daughter    Colon cancer Daughter     ALLERGIES:  is allergic to cephalexin, spironolactone, codeine, and doxycycline.  MEDICATIONS:  Current Outpatient Medications  Medication Sig Dispense Refill   acetaminophen (TYLENOL) 325 MG tablet Take 650 mg by mouth in the morning and at bedtime.     apixaban (ELIQUIS) 2.5 MG TABS tablet Take 1 tablet (2.5 mg total) by mouth 2 (two) times daily. 60 tablet 1   aspirin EC 81 MG tablet Take 1 tablet (81 mg total) by mouth daily. Swallow whole.     cyanocobalamin (VITAMIN B12) 1000 MCG tablet Take 1,000 mcg by mouth in the morning.     ferrous sulfate 325 (65 FE) MG EC tablet Take 325 mg by mouth 2 (two) times a week. Tuesday and Thursday in the morning     levothyroxine (SYNTHROID, LEVOTHROID) 112 MCG tablet Take 112  mcg by mouth daily.     metoprolol succinate (TOPROL-XL) 25 MG 24 hr tablet Take 25 mg by mouth daily.     simvastatin (ZOCOR) 40 MG tablet Take 40 mg by mouth daily.     torsemide (DEMADEX) 10 MG tablet Take 10 mg by mouth in the morning.     No current facility-administered medications for this visit.    PHYSICAL EXAMINATION:   Vitals:   02/09/24 1047  BP: (!) 115/48  Pulse:  64  Temp: 98.8 F (37.1 C)  SpO2: 99%   Filed Weights   02/09/24 1047  Weight: 140 lb (63.5 kg)   Necrotic looking ulcer on right side.     Physical Exam Vitals and nursing note reviewed.  HENT:     Head: Normocephalic and atraumatic.     Mouth/Throat:     Pharynx: Oropharynx is clear.  Eyes:     Extraocular Movements: Extraocular movements intact.     Pupils: Pupils are equal, round, and reactive to light.  Cardiovascular:     Rate and Rhythm: Normal rate and regular rhythm.  Pulmonary:     Comments: Decreased breath sounds bilaterally.  Abdominal:     Palpations: Abdomen is soft.  Musculoskeletal:        General: Normal range of motion.     Cervical back: Normal range of motion.  Skin:    General: Skin is warm.  Neurological:     General: No focal deficit present.     Mental Status: He is alert and oriented to person, place, and time.  Psychiatric:        Behavior: Behavior normal.        Judgment: Judgment normal.     LABORATORY DATA:  I have reviewed the data as listed Lab Results  Component Value Date   WBC 9.6 02/09/2024   HGB 8.1 (L) 02/09/2024   HCT 25.6 (L) 02/09/2024   MCV 99.2 02/09/2024   PLT 299 02/09/2024   Recent Labs    09/28/23 0958 09/29/23 1127 01/22/24 0339 01/22/24 1603 01/23/24 0430 02/09/24 1207  NA  --    < > 139 142 139 138  K  --    < > 4.1 3.9 4.1 3.8  CL  --    < > 103  --  109 104  CO2  --    < > 24  --  23 25  GLUCOSE  --    < > 111*  --  123* 119*  BUN  --    < > 23  --  17 19  CREATININE  --    < > 1.08  --  0.98 1.21  CALCIUM  --     < > 9.1  --  8.5* 8.5*  GFRNONAA  --    < > >60  --  >60 56*  PROT 6.0*  --  6.9  --   --  7.0  ALBUMIN 2.5*  --  3.3*  --   --  3.4*  AST 38  --  37  --   --  21  ALT 34  --  17  --   --  9  ALKPHOS 74  --  71  --   --  58  BILITOT 0.9  --  0.7  --   --  0.8  BILIDIR 0.3*  --   --   --   --   --   IBILI 0.6  --   --   --   --   --    < > = values in this interval not displayed.    RADIOGRAPHIC STUDIES: I have personally reviewed the radiological images as listed and agreed with the findings in the report. US Venous Img Lower Unilateral Left (DVT) Result Date: 02/08/2024 CLINICAL DATA:  Left lower extremity edema for the past week. Evaluate for DVT. EXAM: LEFT LOWER EXTREMITY VENOUS DOPPLER ULTRASOUND TECHNIQUE: Gray-scale sonography with graded compression, as well as color Doppler  and duplex ultrasound were performed to evaluate the lower extremity deep venous systems from the level of the common femoral vein and including the common femoral, femoral, profunda femoral, popliteal and calf veins including the posterior tibial, peroneal and gastrocnemius veins when visible. The superficial great saphenous vein was also interrogated. Spectral Doppler was utilized to evaluate flow at rest and with distal augmentation maneuvers in the common femoral, femoral and popliteal veins. COMPARISON:  Left lower extremity venous Doppler ultrasound-09/23/2023 (negative) PET-CT-07/24/2023 FINDINGS: Contralateral Common Femoral Vein: Respiratory phasicity is normal and symmetric with the symptomatic side. No evidence of thrombus. Normal compressibility. Common Femoral Vein: No evidence of thrombus. Normal compressibility, respiratory phasicity and response to augmentation. Saphenofemoral Junction: No evidence of thrombus. Normal compressibility and flow on color Doppler imaging. Profunda Femoral Vein: No evidence of thrombus. Normal compressibility and flow on color Doppler imaging. Femoral Vein: No evidence of  thrombus. Incidentally noted nonocclusive mural calcifications involving the mid aspect of the left femoral vein (image 30). Normal compressibility, respiratory phasicity and response to augmentation. Popliteal Vein: No evidence of thrombus. Normal compressibility, respiratory phasicity and response to augmentation. Calf Veins: No evidence of thrombus. Normal compressibility and flow on color Doppler imaging. Superficial Great Saphenous Vein: No evidence of thrombus. Normal compressibility. Other Findings: There is an at least 5.7 x 2.7 x 1.6 cm anechoic fluid collection within the left groin, likely fluid contained within the caudal aspect of previously identified moderate-to-large sized indirect inguinal hernia demonstrated on PET-CT performed 06/2023. IMPRESSION: 1. No evidence of DVT within the left lower extremity. 2. At least 5.7 cm anechoic fluid collection within the left groin likely fluid contained within the caudal aspect of previously identified moderate-to-large sized indirect inguinal hernia demonstrated on PET-CT performed 06/2023. Electronically Signed   By: Simonne Come M.D.   On: 02/08/2024 15:06   HYBRID OR IMAGING ([facility] ONLY) Result Date: 01/22/2024 There is no interpretation for this exam.  This order is for images obtained during a surgical procedure.  Please See "Surgeries" Tab for more information regarding the procedure.     Recurrent squamous cell carcinoma in situ (SCCIS) of skin of cheek # 2023 for squamous cell carcinoma of the skin with adenopathy in the right neck -s/p RT- SBRT 30 Gray in 5 fractions. Initially had a complete response.   # January 2025- [Dr.Dasher] Repeat biopsy invasive squamous cell carcinoma moderately.  # Reviewed the options of treatment including # reirradiation # Immunotherapy.  # Patient and family declined radiation given the previous significant side effects.  Alternatively I recommend immunotherapy.  # I discussed the mechanism of action; The  goal of therapy is palliative; and length of treatments are likely ongoing/based upon the results of the scans. Discussed the potential side effects of immunotherapy including but not limited to diarrhea; skin rash; elevated LFTs/endocrine abnormalities etc. given his age-temporality recommend holding off any PET scan at this time.  # Malignant ulcer-foul-smelling no signs of superinfection at this time.  Will refer to wound care.  # Severe Anemia-question etiology hemoglobin 7-8.  Check iron studies ferritin.  Consider iron infusions.  # Right femoral endarterectomy with vein patch angioplasty, right profunda bypass and left leg arteriogram with laser atherectomy of the left popliteal and TP trunk with drug-coated balloon angioplasty of the left popliteal artery by Dr. Myra Gianotti on 01/22/2024.   # Cardiovascular- CHF/A.fib [ Dr.Parashoes] PVD-angioplasty of the left popliteal artery in Jan 2025- on elqiuis.  Clinically stable.  Thank you Dr.Dasher for allowing me to  participate in the care of your pleasant patient. Please do not hesitate to contact me with questions or concerns in the interim.  # DISPOSITION: # wound care referral- re: neck ulcer.  # labs today- cbc/cmp; thyroid profile; iron studies; ferritin; LDH- please order # referral to [facility] re: palliative care # Follow up in 1 week- MD; no labs- chemo; possible venofer-Dr.B  Above plan of care was discussed with patient/family in detail.  My contact information was given to the patient/family.     Earna Coder, MD 02/09/2024 1:00 PM

## 2024-02-10 ENCOUNTER — Telehealth: Payer: Self-pay | Admitting: Internal Medicine

## 2024-02-10 ENCOUNTER — Other Ambulatory Visit: Payer: Self-pay

## 2024-02-10 LAB — THYROID PANEL WITH TSH
Free Thyroxine Index: 1.1 — ABNORMAL LOW (ref 1.2–4.9)
T3 Uptake Ratio: 26 % (ref 24–39)
T4, Total: 4.2 ug/dL — ABNORMAL LOW (ref 4.5–12.0)
TSH: 39.3 u[IU]/mL — ABNORMAL HIGH (ref 0.450–4.500)

## 2024-02-10 NOTE — Telephone Encounter (Signed)
Please check with the [facility]/daughter or son-in-law-if the patient is taking Synthroid as recommended.  Looks like his thyroid levels off.   GB

## 2024-02-11 ENCOUNTER — Encounter: Payer: Self-pay | Admitting: Internal Medicine

## 2024-02-12 ENCOUNTER — Encounter: Payer: PPO | Attending: Physician Assistant | Admitting: Physician Assistant

## 2024-02-12 DIAGNOSIS — L97322 Non-pressure chronic ulcer of left ankle with fat layer exposed: Secondary | ICD-10-CM | POA: Insufficient documentation

## 2024-02-12 DIAGNOSIS — L97422 Non-pressure chronic ulcer of left heel and midfoot with fat layer exposed: Secondary | ICD-10-CM | POA: Diagnosis not present

## 2024-02-12 DIAGNOSIS — L97822 Non-pressure chronic ulcer of other part of left lower leg with fat layer exposed: Secondary | ICD-10-CM | POA: Diagnosis not present

## 2024-02-12 DIAGNOSIS — E11621 Type 2 diabetes mellitus with foot ulcer: Secondary | ICD-10-CM | POA: Diagnosis not present

## 2024-02-12 DIAGNOSIS — Z7901 Long term (current) use of anticoagulants: Secondary | ICD-10-CM | POA: Insufficient documentation

## 2024-02-12 DIAGNOSIS — L97428 Non-pressure chronic ulcer of left heel and midfoot with other specified severity: Secondary | ICD-10-CM | POA: Diagnosis not present

## 2024-02-12 DIAGNOSIS — I480 Paroxysmal atrial fibrillation: Secondary | ICD-10-CM | POA: Diagnosis not present

## 2024-02-12 DIAGNOSIS — I87332 Chronic venous hypertension (idiopathic) with ulcer and inflammation of left lower extremity: Secondary | ICD-10-CM | POA: Diagnosis not present

## 2024-02-12 DIAGNOSIS — L988 Other specified disorders of the skin and subcutaneous tissue: Secondary | ICD-10-CM | POA: Diagnosis not present

## 2024-02-12 DIAGNOSIS — C44329 Squamous cell carcinoma of skin of other parts of face: Secondary | ICD-10-CM | POA: Insufficient documentation

## 2024-02-12 DIAGNOSIS — I70244 Atherosclerosis of native arteries of left leg with ulceration of heel and midfoot: Secondary | ICD-10-CM | POA: Diagnosis not present

## 2024-02-12 DIAGNOSIS — I2510 Atherosclerotic heart disease of native coronary artery without angina pectoris: Secondary | ICD-10-CM | POA: Diagnosis not present

## 2024-02-12 DIAGNOSIS — I5042 Chronic combined systolic (congestive) and diastolic (congestive) heart failure: Secondary | ICD-10-CM | POA: Diagnosis not present

## 2024-02-12 DIAGNOSIS — L98492 Non-pressure chronic ulcer of skin of other sites with fat layer exposed: Secondary | ICD-10-CM | POA: Insufficient documentation

## 2024-02-12 DIAGNOSIS — I110 Hypertensive heart disease with heart failure: Secondary | ICD-10-CM | POA: Insufficient documentation

## 2024-02-12 DIAGNOSIS — L97812 Non-pressure chronic ulcer of other part of right lower leg with fat layer exposed: Secondary | ICD-10-CM | POA: Insufficient documentation

## 2024-02-15 ENCOUNTER — Telehealth: Payer: Self-pay | Admitting: *Deleted

## 2024-02-15 ENCOUNTER — Inpatient Hospital Stay: Payer: PPO | Admitting: Internal Medicine

## 2024-02-15 ENCOUNTER — Inpatient Hospital Stay (HOSPITAL_BASED_OUTPATIENT_CLINIC_OR_DEPARTMENT_OTHER): Payer: PPO | Admitting: Hospice and Palliative Medicine

## 2024-02-15 ENCOUNTER — Inpatient Hospital Stay: Payer: PPO

## 2024-02-15 ENCOUNTER — Encounter: Payer: Self-pay | Admitting: Internal Medicine

## 2024-02-15 VITALS — BP 114/65 | HR 66 | Resp 16

## 2024-02-15 VITALS — BP 96/60 | HR 62 | Temp 96.2°F | Resp 14 | Wt 150.0 lb

## 2024-02-15 DIAGNOSIS — Z515 Encounter for palliative care: Secondary | ICD-10-CM

## 2024-02-15 DIAGNOSIS — D0439 Carcinoma in situ of skin of other parts of face: Secondary | ICD-10-CM

## 2024-02-15 DIAGNOSIS — Z5112 Encounter for antineoplastic immunotherapy: Secondary | ICD-10-CM | POA: Diagnosis not present

## 2024-02-15 LAB — CMP (CANCER CENTER ONLY)
ALT: 10 U/L (ref 0–44)
AST: 21 U/L (ref 15–41)
Albumin: 3.9 g/dL (ref 3.5–5.0)
Alkaline Phosphatase: 61 U/L (ref 38–126)
Anion gap: 11 (ref 5–15)
BUN: 19 mg/dL (ref 8–23)
CO2: 25 mmol/L (ref 22–32)
Calcium: 8.8 mg/dL — ABNORMAL LOW (ref 8.9–10.3)
Chloride: 101 mmol/L (ref 98–111)
Creatinine: 1.09 mg/dL (ref 0.61–1.24)
GFR, Estimated: >60 mL/min (ref >60)
Glucose, Bld: 73 mg/dL (ref 70–99)
Potassium: 3.5 mmol/L (ref 3.5–5.1)
Sodium: 137 mmol/L (ref 135–145)
Total Bilirubin: 0.7 mg/dL (ref 0.0–1.2)
Total Protein: 8.1 g/dL (ref 6.5–8.1)

## 2024-02-15 LAB — CBC WITH DIFFERENTIAL (CANCER CENTER ONLY)
Abs Immature Granulocytes: 0.06 10*3/uL (ref 0.00–0.07)
Basophils Absolute: 0.1 10*3/uL (ref 0.0–0.1)
Basophils Relative: 1 %
Eosinophils Absolute: 0.6 10*3/uL — ABNORMAL HIGH (ref 0.0–0.5)
Eosinophils Relative: 6 %
HCT: 29.7 % — ABNORMAL LOW (ref 39.0–52.0)
Hemoglobin: 9.4 g/dL — ABNORMAL LOW (ref 13.0–17.0)
Immature Granulocytes: 1 %
Lymphocytes Relative: 17 %
Lymphs Abs: 1.7 10*3/uL (ref 0.7–4.0)
MCH: 31.3 pg (ref 26.0–34.0)
MCHC: 31.6 g/dL (ref 30.0–36.0)
MCV: 99 fL (ref 80.0–100.0)
Monocytes Absolute: 1.1 10*3/uL — ABNORMAL HIGH (ref 0.1–1.0)
Monocytes Relative: 11 %
Neutro Abs: 6.4 10*3/uL (ref 1.7–7.7)
Neutrophils Relative %: 64 %
Platelet Count: 282 10*3/uL (ref 150–400)
RBC: 3 MIL/uL — ABNORMAL LOW (ref 4.22–5.81)
RDW: 22.3 % — ABNORMAL HIGH (ref 11.5–15.5)
WBC Count: 9.9 10*3/uL (ref 4.0–10.5)
nRBC: 0 % (ref 0.0–0.2)

## 2024-02-15 LAB — TSH: TSH: 46.84 u[IU]/mL — ABNORMAL HIGH (ref 0.350–4.500)

## 2024-02-15 MED ORDER — SODIUM CHLORIDE 0.9 % IV SOLN
INTRAVENOUS | Status: DC
  Filled 2024-02-15: qty 250

## 2024-02-15 MED ORDER — CEMIPLIMAB-RWLC CHEMO INJECTION 350 MG/7ML
350.0000 mg | Freq: Once | INTRAVENOUS | Status: AC
  Administered 2024-02-15: 350 mg via INTRAVENOUS
  Filled 2024-02-15: qty 7

## 2024-02-15 NOTE — Patient Instructions (Addendum)
[facility] - A DEPT OF [facility]  Discharge Instructions: Thank you for choosing [facility] to provide your oncology and hematology care.  If you have a lab appointment with the Cancer Center, please go directly to the Cancer Center and check in at the registration area.  Wear comfortable clothing and clothing appropriate for easy access to any Portacath or PICC line.   We strive to give you quality time with your provider. You may need to reschedule your appointment if you arrive late (15 or more minutes).  Arriving late affects you and other patients whose appointments are after yours.  Also, if you miss three or more appointments without notifying the office, you may be dismissed from the clinic at the provider's discretion.      For prescription refill requests, have your pharmacy contact our office and allow 72 hours for refills to be completed.    Today you received the following chemotherapy and/or immunotherapy agents Libtayo      To help prevent nausea and vomiting after your treatment, we encourage you to take your nausea medication as directed.  BELOW ARE SYMPTOMS THAT SHOULD BE REPORTED IMMEDIATELY: *FEVER GREATER THAN 100.4 F (38 C) OR HIGHER *CHILLS OR SWEATING *NAUSEA AND VOMITING THAT IS NOT CONTROLLED WITH YOUR NAUSEA MEDICATION *UNUSUAL SHORTNESS OF BREATH *UNUSUAL BRUISING OR BLEEDING *URINARY PROBLEMS (pain or burning when urinating, or frequent urination) *BOWEL PROBLEMS (unusual diarrhea, constipation, pain near the anus) TENDERNESS IN MOUTH AND THROAT WITH OR WITHOUT PRESENCE OF ULCERS (sore throat, sores in mouth, or a toothache) UNUSUAL RASH, SWELLING OR PAIN  UNUSUAL VAGINAL DISCHARGE OR ITCHING   Items with * indicate a potential emergency and should be followed up as soon as possible or go to the Emergency Department if any problems should occur.  Please show the CHEMOTHERAPY ALERT CARD or IMMUNOTHERAPY  ALERT CARD at check-in to the Emergency Department and triage nurse.  Should you have questions after your visit or need to cancel or reschedule your appointment, please contact [facility] - A DEPT OF [facility]  [phone] and follow the prompts.  Office hours are 8:00 a.m. to 4:30 p.m. Monday - Friday. Please note that voicemails left after 4:00 p.m. may not be returned until the following business day.  We are closed weekends and major holidays. You have access to a nurse at all times for urgent questions. Please call the main number to the clinic [phone] and follow the prompts.  For any non-urgent questions, you may also contact your provider using MyChart. We now offer e-Visits for anyone 46 and older to request care online for non-urgent symptoms. For details visit [email or web address].   Also download the MyChart app! Go to the app store, search "MyChart", open the app, select [place], and log in with your MyChart username and password.  Cemiplimab Injection What is this medication? CEMIPLIMAB (se MIP li mab) treats skin cancer and lung cancer. It works by helping your immune system slow or stop the spread of cancer cells. It is a monoclonal antibody. This medicine may be used for other purposes; ask your health care provider or pharmacist if you have questions. COMMON BRAND NAME(S): LIBTAYO What should I tell my care team before I take this medication? They need to know if you have any of these conditions: Allogeneic stem cell transplant (uses someone else's stem cells) Autoimmune diseases, such as Crohn disease,  ulcerative colitis, lupus History of chest radiation Nervous system problems, such as Guillain-Barre syndrome or myasthenia gravis Organ transplant An unusual or allergic reaction to cemiplimab, other medications, foods, dyes, or preservatives Pregnant or trying to get pregnant Breastfeeding How should I use this  medication? This medication is infused into a vein. It is given by your care team in a hospital or clinic setting. A special MedGuide will be given to you before each treatment. Be sure to read this information carefully each time. Talk to your care team about the use of this medication in children. Special care may be needed. Overdosage: If you think you have taken too much of this medicine contact a poison control center or emergency room at once. NOTE: This medicine is only for you. Do not share this medicine with others. What if I miss a dose? Keep appointments for follow-up doses. It is important not to miss your dose. Call your care team if you are unable to keep an appointment. What may interact with this medication? Interactions have not been studied. This list may not describe all possible interactions. Give your health care provider a list of all the medicines, herbs, non-prescription drugs, or dietary supplements you use. Also tell them if you smoke, drink alcohol, or use illegal drugs. Some items may interact with your medicine. What should I watch for while using this medication? Visit your care team for regular checks on your progress. It may be some time before you see the benefit from this medication. You may need blood work done while taking this medication. This medication may cause serious skin reactions. They can happen weeks to months after starting the medication. Contact your care team right away if you notice fevers or flu-like symptoms with a rash. The rash may be red or purple and then turn into blisters or peeling of the skin. You may also notice a red rash with swelling of the face, lips, or lymph nodes in your neck or under your arms. Tell your care team right away if you have any change in your eyesight. Talk to your care team if you may be pregnant. You will need a negative pregnancy test before starting this medication. Contraception is recommended while taking this  medication and for 4 months after the last dose. Your care team can help you find the option that works for you. Do not breastfeed while taking this medication and for at least 4 months after the last dose. What side effects may I notice from receiving this medication? Side effects that you should report to your care team as soon as possible: Allergic reactions--skin rash, itching, hives, swelling of the face, lips, tongue, or throat Dry cough, shortness of breath or trouble breathing Eye pain, redness, irritation, or discharge with blurry or decreased vision Heart muscle inflammation--unusual weakness or fatigue, shortness of breath, chest pain, fast or irregular heartbeat, dizziness, swelling of the ankles, feet, or hands Hormone gland problems--headache, sensitivity to light, unusual weakness or fatigue, dizziness, fast or irregular heartbeat, increased sensitivity to cold or heat, excessive sweating, constipation, hair loss, increased thirst or amount of urine, tremors or shaking, irritability Infusion reactions--chest pain, shortness of breath or trouble breathing, feeling faint or lightheaded Kidney injury (glomerulonephritis)--decrease in the amount of urine, red or dark brown urine, foamy or bubbly urine, swelling of the ankles, hands, or feet Liver injury--right upper belly pain, loss of appetite, nausea, light-colored stool, dark yellow or brown urine, yellowing skin or eyes, unusual weakness or fatigue  Pain, tingling, or numbness in the hands or feet, muscle weakness, change in vision, confusion or trouble speaking, loss of balance or coordination, trouble walking, seizures Rash, fever, and swollen lymph nodes Redness, blistering, peeling, or loosening of the skin, including inside the mouth Sudden or severe stomach pain, bloody diarrhea, fever, nausea, vomiting Side effects that usually do not require medical attention (report these to your care team if they continue or are  bothersome): Bone, joint, or muscle pain Diarrhea Fatigue Loss of appetite Nausea Skin rash This list may not describe all possible side effects. Call your doctor for medical advice about side effects. You may report side effects to FDA at 1-800-FDA-1088. Where should I keep my medication? This medication is given in a hospital or clinic and will not be stored at home. NOTE: This sheet is a summary. It may not cover all possible information. If you have questions about this medicine, talk to your doctor, pharmacist, or health care provider.  2024 Elsevier/Gold Standard (2023-02-02 00:00:00)

## 2024-02-15 NOTE — Assessment & Plan Note (Addendum)
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 S/p SBRT 30 Gray in 5 fractions. January 2025- [Dr.Dasher] Repeat biopsy invasive squamous cell carcinoma moderately.  # proceed with Libtayo. Labs-CBC/chemistries were reviewed with the patient.  # I discussed the mechanism of action; The goal of therapy is palliative; and length of treatments are likely ongoing/based upon the results of the scans. Discussed the potential side effects of immunotherapy including but not limited to diarrhea; skin rash; elevated LFTs/endocrine abnormalities etc.  Again reviewed with patient/ and daughter.   # Malignant ulcer-foul-smelling no signs of superinfection at this time- s/p Wound care referral-  # Hypothyroidism - FEB 2025- TSH- 39; .  Of note patient is on Synthroid.will order on empty stomach a the facility   # Severe Anemia-question etiology- FEB 2025-  I sat 80- ferritin- 90- ACD- HOL venofer- stable.   # Cardiovascular- CHF/A.fib [ Dr.Parashoes] PVD-angioplasty of the left popliteal artery in Jan 2025- on elqiuis.  Clinically stable.  # ACP: referral to Josh re: palliative care  # DISPOSITION: # HOLD  venofer # Libtayo treatment today # Follow up in 3  week- MD; labs-cbc/cmp; TSH- Libtayo ; # Follow up in 6  week- MD; labs-cbc/cmp; TSH- Libtayo  -Dr.B

## 2024-02-15 NOTE — Progress Notes (Signed)
Pt in for follow up, reports had some bleeding from wound on right jawline. Pt denies any discomfort. Daughter report wound continues to have an odor.

## 2024-02-15 NOTE — Telephone Encounter (Signed)
I called back to [facility] and got voicemail for Tia and the thyroid . The MD wrote the thyroid med. And the Nurse sent the instructions  which is take pill on empty stomach first thing in am and take it with water, then do not  eat or drink for 1 hour. I left the message .

## 2024-02-15 NOTE — Progress Notes (Signed)
Palliative Medicine [facility] at [facility] [phone] [phone]   Name: [name] [name] Date: 02/15/2024 MRN: [id number]  DOB: 04-Oct-1929  Patient Care Team: [name], MD as PCP - General (Internal Medicine) Earna Coder, MD as Consulting Physician (Oncology)    REASON FOR CONSULTATION: [name] [name] is a 89 y.o. male with multiple medical problems including recurrent metastatic squamous cell carcinoma of the right neck.  Previously received surgery at [facility] in 2023.  Also status post RT.  Now with recurrent disease and open wound.  Patient referred to palliative care to address goals and manage ongoing symptoms.  SOCIAL HISTORY:     reports that he has never smoked. He has never used smokeless tobacco. He reports that he does not drink alcohol and does not use drugs.  Patient is widowed.  Lives at [facility].  Has a daughter who is involved.  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  ADVANCE DIRECTIVES:  on file  CODE STATUS: DNR/DNI (DNR order signed on 02/15/2024)  PAST MEDICAL HISTORY: Past Medical History:  Diagnosis Date   A-fib [facility])    a.) CHA2DS2VASc = 5 (age x 2, CHF, HTN, previous MI/vascular disease history);  b.) s/p DCCV 03/05/1993; c.) rate/rhythm maintained on oral metoprolol succinate; chronically anticoagulated with dose reduced apixaban   Aortic atherosclerosis (HCC)    B12 deficiency    CHF (congestive heart failure) (HCC)    a.) TTE 11/05/12: EF 25-35, dist sep AK, mild MR; b.) TTE 05/10/14: EF 30, mild LVH, mod BAE, mild AR/PR, mod MR/TR; c.) TTE 03/12/20: EF 20, sev glob HK, mod BAE, mild LAE, mod RAE, triv PR, mild AR, sev MR/TR; d.) TTE 08/21/21: EF 30, sev glob HK, apical dyskinesis, mild LVH, mod BAE, triv AR/PR, mod MR, sev TR; e.) TTE 09/25/23: EF 35-40,  diff AK/HK, G2DD, sev BAE, RVE, RVSP 66.3, mild MR, AoV calc   Cor pulmonale (HCC)    Coronary artery disease  02/21/1993   a.) LHC 02/21/1993 : 95% pLAD, 75% OM1, 50% OM3, 25-50% pRCA, 50% large anterolateral lesion --> transfer to [facility] for CABG; b.) s/p 2v CABG 02/26/1993 (LIMA-LAD, SVG-OM1)   Family history of adverse reaction to anesthesia    a.) PONV in 1st degree relative (daughter)   GERD (gastroesophageal reflux disease)    History of [place] spotted fever    HLD (hyperlipidemia)    HTN (hypertension), benign 04/25/2014   Hypothyroidism    Iron deficiency anemia    On apixaban therapy    Posterolateral myocardial infarction (HCC) 02/21/1993   LHC 02/21/1993 : 95% pLAD, 75% OM1, 50% OM3, 25-50% pRCA, 50% large anterolateral lesion --> transfer to [facility] for CABG; b.) s/p 2v CABG 02/26/1993 (LIMA-LAD, SVG-OM1)   Pulmonary HTN (HCC)    a.) TTE 05/10/2014: RVSP 48.6; b.) TTE 03/12/2020: RVSP 73.3; c.) TTE  09/25/2023: RVSP 66.3   PVD (peripheral vascular disease) (HCC)    S/P CABG x 2 02/26/1993   a.) LIMA-LAD, SVG-OM1 --> complicated by atrial flutter on POD4 --> Tx'd with digoxin + procainamide with no resolution. POD5 diltiazem gtt added with no improvement. Ultimately required DCCV on 03/05/1993.   Squamous cell carcinoma of skin 11/18/2021   right ear and mandible, [facility]   Squamous cell carcinoma of skin 03/18/2022   right mandible and ear/refer for Mohs/ Dr. Adriana Simas has referred pt to Dr. Nedra Hai in otolaryngology excised by Dr. Nedra Hai 05/03/22,  Radiation with Dr. Rushie Chestnut   Type 2 diabetes mellitus with peripheral angiopathy (HCC) 11/10/2018    PAST SURGICAL HISTORY:  Past Surgical History:  Procedure Laterality Date   ANGIOPLASTY Left 01/22/2024   Procedure: BALLOON ANGIOPLASTY OF TIBIAL PERONEAL TRUNK AND POSTERIOR TBIAL ARTERY AND DRUG COATED BALLOON ANGIOPLASTY OF POPLITEAL ARTERY;  Surgeon: Nada Libman, MD;  Location: [facility] OR;  Service: Vascular;  Laterality: Left;   cancer removal   2023   below the right base of ear at [facility]   COLONOSCOPY     CORONARY ARTERY BYPASS GRAFT N/A  02/26/1993   Procedure: CORONARY ARTERY BYPASS GRAFT; Location: [facility]; Surgeon: Marshell Garfinkel, MD   ENDARTERECTOMY FEMORAL Left 01/22/2024   Procedure: LEFT COMMON FEMORAL ENDARTERECTOMY WITH VEIN PATCH;  Surgeon: Nada Libman, MD;  Location: [facility] OR;  Service: Vascular;  Laterality: Left;   FEMORAL-FEMORAL BYPASS GRAFT Left 01/22/2024   Procedure: BYPASS GRAFT LEFT COMMON FEMORAL TO PROFUNDA  ARTERY USING DACRON GRAFT;  Surgeon: Nada Libman, MD;  Location: [facility] OR;  Service: Vascular;  Laterality: Left;   HERNIA REPAIR     LOWER EXTREMITY ANGIOGRAPHY Left 08/30/2021   Procedure: LOWER EXTREMITY ANGIOGRAPHY;  Surgeon: Renford Dills, MD;  Location: [facility] INVASIVE CV LAB;  Service: Cardiovascular;  Laterality: Left;   LOWER EXTREMITY ANGIOGRAPHY Left 11/10/2023   Procedure: Lower Extremity Angiography;  Surgeon: Renford Dills, MD;  Location: [facility] INVASIVE CV LAB;  Service: Cardiovascular;  Laterality: Left;    HEMATOLOGY/ONCOLOGY HISTORY:  Oncology History   No history exists.    ALLERGIES:  is allergic to cephalexin, spironolactone, codeine, and doxycycline.  MEDICATIONS:  Current Outpatient Medications  Medication Sig Dispense Refill   acetaminophen (TYLENOL) 325 MG tablet Take 650 mg by mouth in the morning and at bedtime.     apixaban (ELIQUIS) 2.5 MG TABS tablet Take 1 tablet (2.5 mg total) by mouth 2 (two) times daily. 60 tablet 1   aspirin EC 81 MG tablet Take 1 tablet (81 mg total) by mouth daily. Swallow whole.     cyanocobalamin (VITAMIN B12) 1000 MCG tablet Take 1,000 mcg by mouth in the morning.     ferrous sulfate 325 (65 FE) MG EC tablet Take 325 mg by mouth 2 (two) times a week. Tuesday and Thursday in the morning     levothyroxine (SYNTHROID, LEVOTHROID) 112 MCG tablet Take 112 mcg by mouth daily.     metoprolol succinate (TOPROL-XL) 25 MG 24 hr tablet Take 25 mg by mouth daily.     simvastatin (ZOCOR) 40 MG tablet Take 40 mg by mouth daily.     torsemide  (DEMADEX) 10 MG tablet Take 10 mg by mouth in the morning.     No current facility-administered medications for this visit.   Facility-Administered Medications Ordered in Other Visits  Medication Dose Route Frequency Provider Last Rate Last Admin   0.9 %  sodium chloride infusion   Intravenous Continuous Earna Coder, MD 10 mL/hr at 02/15/24 1019 New Bag at 02/15/24 1019    VITAL SIGNS: There were no vitals taken for this visit. There were no vitals filed for this visit.  Estimated body mass index is 23.49 kg/m as calculated from the following:   Height as of 02/09/24: 5\' 7"  (1.702 m).   Weight as of an earlier encounter on 02/15/24: 150 lb (68 kg).  LABS: CBC:    Component Value Date/Time   WBC 9.9 02/15/2024 0851   WBC 7.2 01/26/2024 0810  HGB 9.4 (L) 02/15/2024 0851   HGB 11.8 (L) 02/04/2015 1001   HCT 29.7 (L) 02/15/2024 0851   HCT 35.7 (L) 02/04/2015 1001   PLT 282 02/15/2024 0851   PLT 167 02/04/2015 1001   MCV 99.0 02/15/2024 0851   MCV 108 (H) 02/04/2015 1001   NEUTROABS 6.4 02/15/2024 0851   LYMPHSABS 1.7 02/15/2024 0851   MONOABS 1.1 (H) 02/15/2024 0851   EOSABS 0.6 (H) 02/15/2024 0851   BASOSABS 0.1 02/15/2024 0851   Comprehensive Metabolic Panel:    Component Value Date/Time   NA 137 02/15/2024 0852   NA 141 02/04/2015 1001   K 3.5 02/15/2024 0852   K 4.2 02/04/2015 1001   CL 101 02/15/2024 0852   CL 106 02/04/2015 1001   CO2 25 02/15/2024 0852   CO2 30 02/04/2015 1001   BUN 19 02/15/2024 0852   BUN 19 (H) 02/04/2015 1001   CREATININE 1.09 02/15/2024 0852   CREATININE 1.26 02/04/2015 1001   GLUCOSE 73 02/15/2024 0852   GLUCOSE 122 (H) 02/04/2015 1001   CALCIUM 8.8 (L) 02/15/2024 0852   CALCIUM 8.5 02/04/2015 1001   AST 21 02/15/2024 0852   ALT 10 02/15/2024 0852   ALT 20 02/04/2015 1001   ALKPHOS 61 02/15/2024 0852   ALKPHOS 79 02/04/2015 1001   BILITOT 0.7 02/15/2024 0852   PROT 8.1 02/15/2024 0852   PROT 7.2 02/04/2015 1001    ALBUMIN 3.9 02/15/2024 0852   ALBUMIN 3.5 02/04/2015 1001    RADIOGRAPHIC STUDIES: US Venous Img Lower Unilateral Left (DVT) Result Date: 02/08/2024 CLINICAL DATA:  Left lower extremity edema for the past week. Evaluate for DVT. EXAM: LEFT LOWER EXTREMITY VENOUS DOPPLER ULTRASOUND TECHNIQUE: Gray-scale sonography with graded compression, as well as color Doppler and duplex ultrasound were performed to evaluate the lower extremity deep venous systems from the level of the common femoral vein and including the common femoral, femoral, profunda femoral, popliteal and calf veins including the posterior tibial, peroneal and gastrocnemius veins when visible. The superficial great saphenous vein was also interrogated. Spectral Doppler was utilized to evaluate flow at rest and with distal augmentation maneuvers in the common femoral, femoral and popliteal veins. COMPARISON:  Left lower extremity venous Doppler ultrasound-09/23/2023 (negative) PET-CT-07/24/2023 FINDINGS: Contralateral Common Femoral Vein: Respiratory phasicity is normal and symmetric with the symptomatic side. No evidence of thrombus. Normal compressibility. Common Femoral Vein: No evidence of thrombus. Normal compressibility, respiratory phasicity and response to augmentation. Saphenofemoral Junction: No evidence of thrombus. Normal compressibility and flow on color Doppler imaging. Profunda Femoral Vein: No evidence of thrombus. Normal compressibility and flow on color Doppler imaging. Femoral Vein: No evidence of thrombus. Incidentally noted nonocclusive mural calcifications involving the mid aspect of the left femoral vein (image 30). Normal compressibility, respiratory phasicity and response to augmentation. Popliteal Vein: No evidence of thrombus. Normal compressibility, respiratory phasicity and response to augmentation. Calf Veins: No evidence of thrombus. Normal compressibility and flow on color Doppler imaging. Superficial Great Saphenous  Vein: No evidence of thrombus. Normal compressibility. Other Findings: There is an at least 5.7 x 2.7 x 1.6 cm anechoic fluid collection within the left groin, likely fluid contained within the caudal aspect of previously identified moderate-to-large sized indirect inguinal hernia demonstrated on PET-CT performed 06/2023. IMPRESSION: 1. No evidence of DVT within the left lower extremity. 2. At least 5.7 cm anechoic fluid collection within the left groin likely fluid contained within the caudal aspect of previously identified moderate-to-large sized indirect inguinal hernia demonstrated on PET-CT performed  06/2023. Electronically Signed   By: Simonne Come M.D.   On: 02/08/2024 15:06   HYBRID OR IMAGING ([facility] ONLY) Result Date: 01/22/2024 There is no interpretation for this exam.  This order is for images obtained during a surgical procedure.  Please See "Surgeries" Tab for more information regarding the procedure.    PERFORMANCE STATUS (ECOG) : 3 - Symptomatic, >50% confined to bed  Review of Systems Unless otherwise noted, a complete review of systems is negative.  Physical Exam General: NAD Pulmonary: Unlabored Extremities: no edema, no joint deformities Skin: no rashes Neurological: Weakness, hard of hearing, but otherwise nonfocal  IMPRESSION: I met with patient and daughter in infusion.  He has been started on Libtayo for recurrent squamous cell carcinoma of the right neck.  He has an open wound which was not visualized today.  He has seen the wound center for this.  At baseline, patient lives at [facility].  He does require significant assistance with ADLs.  He is mostly in a wheelchair.  Daughter is interested in community palliative care following at ALF.  Will coordinate this referral.  Patient denies any symptomatic complaints or concerns at present.  Per daughter, she is his healthcare power of attorney.  Patient has previously completed advanced directives, which have been scanned  into the EMR.  Patient/daughter confirmed DNR/DNI.  DNR order signed for him to take back to ALF.  PLAN: -Continue current scope of treatment -Referral to community palliative care -DNR order signed -RTC 1 month   Patient expressed understanding and was in agreement with this plan. He also understands that He can call the clinic at any time with any questions, concerns, or complaints.     Time Total: 20 minutes  Visit consisted of counseling and education dealing with the complex and emotionally intense issues of symptom management and palliative care in the setting of serious and potentially life-threatening illness.Greater than 50%  of this time was spent counseling and coordinating care related to the above assessment and plan.  Signed by: Laurette Schimke, PhD, NP-C

## 2024-02-15 NOTE — Progress Notes (Signed)
Pharmacist Chemotherapy Monitoring - Initial Assessment    Anticipated start date: 02/15/24   The following has been reviewed per standard work regarding the patient's treatment regimen: The patient's diagnosis, treatment plan and drug doses, and organ/hematologic function Lab orders and baseline tests specific to treatment regimen  The treatment plan start date, drug sequencing, and pre-medications Prior authorization status  Patient's documented medication list, including drug-drug interaction screen and prescriptions for anti-emetics and supportive care specific to the treatment regimen The drug concentrations, fluid compatibility, administration routes, and timing of the medications to be used The patient's access for treatment and lifetime cumulative dose history, if applicable  The patient's medication allergies and previous infusion related reactions, if applicable   Changes made to treatment plan:  N/A  Follow up needed:  N/A   Sharen Hones, RPH, 02/15/2024  10:26 AM

## 2024-02-15 NOTE — Progress Notes (Signed)
[facility] CONSULT NOTE  Patient Care Team: [name], MD as PCP - General (Internal Medicine) Earna Coder, MD as Consulting Physician (Oncology)  CHIEF COMPLAINTS/PURPOSE OF CONSULTATION: SCC     JULY 2024- 1. 11 mm subcutaneous nodule in the right neck near the mandibular angle is hypermetabolic. 2. No other suspicious hypermetabolism in the neck, chest, abdomen, or pelvis. 3. Cholelithiasis. 4. Left groin hernia contains a segment of sigmoid colon without complicating features. 5.  Aortic Atherosclerosis (ICD10-I70.    JULY 2024-- Lymph node, biopsy, Right neck. Submandibular mass - METASTATIC SQUAMOUS CELL CARCINOMA  # Patient had been treated for this in the past with surgery at [facility] in 2023.   # 2023- for squamous cell carcinoma of the skin with adenopathy in the right neck- s/p RT; recurrence-  # s/p LB Bx- JULY 2024-A nodule in the right subdigastric region which was biopsy positive again for squamous cell carcinoma. S/p SBRT 30 Gray in 5 fractions.   # January 2025- [Dr.Dasher] Repeat biopsy invasive squamous cell carcinoma moderately- FEB 18th, 2025- START CEPI  HISTORY OF PRESENTING ILLNESS: Patient ambulating-in wheel chair. Accompanied by daughter, [name] [name] 89 y.o.  male pleasant patient with Hx of SCC-recurrent -right central mandibular cheek is here for follow-up and proceed with immunotherapy.   The lesion is intermittently bleeding and enlarging.  At the last visit noted to have elevated thyroid function.  Of note patient is on Synthroid.   Review of Systems  Constitutional:  Positive for malaise/fatigue and weight loss. Negative for chills, diaphoresis and fever.  HENT:  Negative for nosebleeds and sore throat.   Eyes:  Negative for double vision.  Respiratory:  Negative for cough, hemoptysis, sputum production, shortness of breath and wheezing.   Cardiovascular:  Negative for chest pain, palpitations,  orthopnea and leg swelling.  Gastrointestinal:  Negative for abdominal pain, blood in stool, constipation, diarrhea, heartburn, melena, nausea and vomiting.  Genitourinary:  Negative for dysuria, frequency and urgency.  Musculoskeletal:  Positive for back pain and joint pain.  Skin: Negative.  Negative for itching and rash.  Neurological:  Negative for dizziness, tingling, focal weakness, weakness and headaches.  Endo/Heme/Allergies:  Does not bruise/bleed easily.  Psychiatric/Behavioral:  Negative for depression. The patient is not nervous/anxious and does not have insomnia.     MEDICAL HISTORY:  Past Medical History:  Diagnosis Date   A-fib [facility])    a.) CHA2DS2VASc = 5 (age x 2, CHF, HTN, previous MI/vascular disease history);  b.) s/p DCCV 03/05/1993; c.) rate/rhythm maintained on oral metoprolol succinate; chronically anticoagulated with dose reduced apixaban   Aortic atherosclerosis (HCC)    B12 deficiency    CHF (congestive heart failure) (HCC)    a.) TTE 11/05/12: EF 25-35, dist sep AK, mild MR; b.) TTE 05/10/14: EF 30, mild LVH, mod BAE, mild AR/PR, mod MR/TR; c.) TTE 03/12/20: EF 20, sev glob HK, mod BAE, mild LAE, mod RAE, triv PR, mild AR, sev MR/TR; d.) TTE 08/21/21: EF 30, sev glob HK, apical dyskinesis, mild LVH, mod BAE, triv AR/PR, mod MR, sev TR; e.) TTE 09/25/23: EF 35-40,  diff AK/HK, G2DD, sev BAE, RVE, RVSP 66.3, mild MR, AoV calc   Cor pulmonale (HCC)    Coronary artery disease 02/21/1993   a.) LHC 02/21/1993 : 95% pLAD, 75% OM1, 50% OM3, 25-50% pRCA, 50% large anterolateral lesion --> transfer to [facility] for CABG; b.) s/p 2v CABG 02/26/1993 (LIMA-LAD, SVG-OM1)   Family history of  adverse reaction to anesthesia    a.) PONV in 1st degree relative (daughter)   GERD (gastroesophageal reflux disease)    History of [place] spotted fever    HLD (hyperlipidemia)    HTN (hypertension), benign 04/25/2014   Hypothyroidism    Iron deficiency anemia    On apixaban therapy     Posterolateral myocardial infarction (HCC) 02/21/1993   LHC 02/21/1993 : 95% pLAD, 75% OM1, 50% OM3, 25-50% pRCA, 50% large anterolateral lesion --> transfer to [facility] for CABG; b.) s/p 2v CABG 02/26/1993 (LIMA-LAD, SVG-OM1)   Pulmonary HTN (HCC)    a.) TTE 05/10/2014: RVSP 48.6; b.) TTE 03/12/2020: RVSP 73.3; c.) TTE  09/25/2023: RVSP 66.3   PVD (peripheral vascular disease) (HCC)    S/P CABG x 2 02/26/1993   a.) LIMA-LAD, SVG-OM1 --> complicated by atrial flutter on POD4 --> Tx'd with digoxin + procainamide with no resolution. POD5 diltiazem gtt added with no improvement. Ultimately required DCCV on 03/05/1993.   Squamous cell carcinoma of skin 11/18/2021   right ear and mandible, ED[facility]   Squamous cell carcinoma of skin 03/18/2022   right mandible and ear/refer for Mohs/ Dr. Adriana Simas has referred pt to Dr. Nedra Hai in otolaryngology excised by Dr. Nedra Hai 05/03/22, Radiation with Dr. Rushie Chestnut   Type 2 diabetes mellitus with peripheral angiopathy (HCC) 11/10/2018    SURGICAL HISTORY: Past Surgical History:  Procedure Laterality Date   ANGIOPLASTY Left 01/22/2024   Procedure: BALLOON ANGIOPLASTY OF TIBIAL PERONEAL TRUNK AND POSTERIOR TBIAL ARTERY AND DRUG COATED BALLOON ANGIOPLASTY OF POPLITEAL ARTERY;  Surgeon: Nada Libman, MD;  Location: [facility] OR;  Service: Vascular;  Laterality: Left;   cancer removal   2023   below the right base of ear at [facility]   COLONOSCOPY     CORONARY ARTERY BYPASS GRAFT N/A 02/26/1993   Procedure: CORONARY ARTERY BYPASS GRAFT; Location: [facility]; Surgeon: Marshell Garfinkel, MD   ENDARTERECTOMY FEMORAL Left 01/22/2024   Procedure: LEFT COMMON FEMORAL ENDARTERECTOMY WITH VEIN PATCH;  Surgeon: Nada Libman, MD;  Location: [facility] OR;  Service: Vascular;  Laterality: Left;   FEMORAL-FEMORAL BYPASS GRAFT Left 01/22/2024   Procedure: BYPASS GRAFT LEFT COMMON FEMORAL TO PROFUNDA  ARTERY USING DACRON GRAFT;  Surgeon: Nada Libman, MD;  Location: [facility] OR;  Service: Vascular;  Laterality: Left;    HERNIA REPAIR     LOWER EXTREMITY ANGIOGRAPHY Left 08/30/2021   Procedure: LOWER EXTREMITY ANGIOGRAPHY;  Surgeon: Renford Dills, MD;  Location: [facility] INVASIVE CV LAB;  Service: Cardiovascular;  Laterality: Left;   LOWER EXTREMITY ANGIOGRAPHY Left 11/10/2023   Procedure: Lower Extremity Angiography;  Surgeon: Renford Dills, MD;  Location: [facility] INVASIVE CV LAB;  Service: Cardiovascular;  Laterality: Left;    SOCIAL HISTORY: Social History   Socioeconomic History   Marital status: Widowed    Spouse name: Not on file   Number of children: 2   Years of education: Not on file   Highest education level: Not on file  Occupational History   Not on file  Tobacco Use   Smoking status: Never   Smokeless tobacco: Never  Vaping Use   Vaping status: Never Used  Substance and Sexual Activity   Alcohol use: No   Drug use: No   Sexual activity: Not on file  Other Topics Concern   Not on file  Social History Narrative   Lives by himself: daughter penny lives in [place]. Daughter [name] lives in town   Social Drivers of [employer]  Resource Strain: Low Risk  (09/22/2023)   Received from [facility]   Overall Financial Resource Strain (CARDIA)    Difficulty of Paying Living Expenses: Not hard at all  Food Insecurity: No Food Insecurity (02/09/2024)   Hunger Vital Sign    Worried About Running Out of Food in the Last Year: Never true    Ran Out of Food in the Last Year: Never true  Transportation Needs: No Transportation Needs (02/09/2024)   PRAPARE - Administrator, Civil Service (Medical): No    Lack of Transportation (Non-Medical): No  Physical Activity: Not on file  Stress: Not on file  Social Connections: Unknown (01/22/2024)   Social Connection and Isolation Panel [NHANES]    Frequency of Communication with Friends and Family: Patient unable to answer    Frequency of Social Gatherings with Friends and Family: Patient unable to answer     Attends Religious Services: Not on file    Active Member of Clubs or Organizations: Not on file    Attends Banker Meetings: Not on file    Marital Status: Patient unable to answer  Intimate Partner Violence: Not At Risk (02/09/2024)   Humiliation, Afraid, Rape, and Kick questionnaire    Fear of Current or Ex-Partner: No    Emotionally Abused: No    Physically Abused: No    Sexually Abused: No    FAMILY HISTORY: Family History  Problem Relation Age of Onset   Hypertension Mother    Diabetes Brother    Stroke Brother    Heart attack Brother    Esophageal cancer Daughter    Colon cancer Daughter     ALLERGIES:  is allergic to cephalexin, spironolactone, codeine, and doxycycline.  MEDICATIONS:  Current Outpatient Medications  Medication Sig Dispense Refill   acetaminophen (TYLENOL) 325 MG tablet Take 650 mg by mouth in the morning and at bedtime.     apixaban (ELIQUIS) 2.5 MG TABS tablet Take 1 tablet (2.5 mg total) by mouth 2 (two) times daily. 60 tablet 1   aspirin EC 81 MG tablet Take 1 tablet (81 mg total) by mouth daily. Swallow whole.     cyanocobalamin (VITAMIN B12) 1000 MCG tablet Take 1,000 mcg by mouth in the morning.     ferrous sulfate 325 (65 FE) MG EC tablet Take 325 mg by mouth 2 (two) times a week. Tuesday and Thursday in the morning     levothyroxine (SYNTHROID, LEVOTHROID) 112 MCG tablet Take 112 mcg by mouth daily.     metoprolol succinate (TOPROL-XL) 25 MG 24 hr tablet Take 25 mg by mouth daily.     simvastatin (ZOCOR) 40 MG tablet Take 40 mg by mouth daily.     torsemide (DEMADEX) 10 MG tablet Take 10 mg by mouth in the morning.     No current facility-administered medications for this visit.   Facility-Administered Medications Ordered in Other Visits  Medication Dose Route Frequency Provider Last Rate Last Admin   0.9 %  sodium chloride infusion   Intravenous Continuous Earna Coder, MD 10 mL/hr at 02/15/24 1019 New Bag at 02/15/24  1019    PHYSICAL EXAMINATION:   Vitals:   02/15/24 0917  BP: 96/60  Pulse: 62  Resp: 14  Temp: (!) 96.2 F (35.7 C)    Filed Weights   02/15/24 0917  Weight: 150 lb (68 kg)    Necrotic looking ulcer on right side.     Physical Exam Vitals and nursing note  reviewed.  HENT:     Head: Normocephalic and atraumatic.     Mouth/Throat:     Pharynx: Oropharynx is clear.  Eyes:     Extraocular Movements: Extraocular movements intact.     Pupils: Pupils are equal, round, and reactive to light.  Cardiovascular:     Rate and Rhythm: Normal rate and regular rhythm.  Pulmonary:     Comments: Decreased breath sounds bilaterally.  Abdominal:     Palpations: Abdomen is soft.  Musculoskeletal:        General: Normal range of motion.     Cervical back: Normal range of motion.  Skin:    General: Skin is warm.  Neurological:     General: No focal deficit present.     Mental Status: He is alert and oriented to person, place, and time.  Psychiatric:        Behavior: Behavior normal.        Judgment: Judgment normal.     LABORATORY DATA:  I have reviewed the data as listed Lab Results  Component Value Date   WBC 9.9 02/15/2024   HGB 9.4 (L) 02/15/2024   HCT 29.7 (L) 02/15/2024   MCV 99.0 02/15/2024   PLT 282 02/15/2024   Recent Labs    09/28/23 0958 09/29/23 1127 01/22/24 0339 01/22/24 1603 01/23/24 0430 02/09/24 1207 02/15/24 0852  NA  --    < > 139   < > 139 138 137  K  --    < > 4.1   < > 4.1 3.8 3.5  CL  --    < > 103  --  109 104 101  CO2  --    < > 24  --  23 25 25   GLUCOSE  --    < > 111*  --  123* 119* 73  BUN  --    < > 23  --  17 19 19   CREATININE  --    < > 1.08  --  0.98 1.21 1.09  CALCIUM  --    < > 9.1  --  8.5* 8.5* 8.8*  GFRNONAA  --    < > >60  --  >60 56* >60  PROT 6.0*  --  6.9  --   --  7.0 8.1  ALBUMIN 2.5*  --  3.3*  --   --  3.4* 3.9  AST 38  --  37  --   --  21 21  ALT 34  --  17  --   --  9 10  ALKPHOS 74  --  71  --   --  58 61   BILITOT 0.9  --  0.7  --   --  0.8 0.7  BILIDIR 0.3*  --   --   --   --   --   --   IBILI 0.6  --   --   --   --   --   --    < > = values in this interval not displayed.    RADIOGRAPHIC STUDIES: I have personally reviewed the radiological images as listed and agreed with the findings in the report. US Venous Img Lower Unilateral Left (DVT) Result Date: 02/08/2024 CLINICAL DATA:  Left lower extremity edema for the past week. Evaluate for DVT. EXAM: LEFT LOWER EXTREMITY VENOUS DOPPLER ULTRASOUND TECHNIQUE: Gray-scale sonography with graded compression, as well as color Doppler and duplex ultrasound were performed to evaluate the lower extremity deep venous systems  from the level of the common femoral vein and including the common femoral, femoral, profunda femoral, popliteal and calf veins including the posterior tibial, peroneal and gastrocnemius veins when visible. The superficial great saphenous vein was also interrogated. Spectral Doppler was utilized to evaluate flow at rest and with distal augmentation maneuvers in the common femoral, femoral and popliteal veins. COMPARISON:  Left lower extremity venous Doppler ultrasound-09/23/2023 (negative) PET-CT-07/24/2023 FINDINGS: Contralateral Common Femoral Vein: Respiratory phasicity is normal and symmetric with the symptomatic side. No evidence of thrombus. Normal compressibility. Common Femoral Vein: No evidence of thrombus. Normal compressibility, respiratory phasicity and response to augmentation. Saphenofemoral Junction: No evidence of thrombus. Normal compressibility and flow on color Doppler imaging. Profunda Femoral Vein: No evidence of thrombus. Normal compressibility and flow on color Doppler imaging. Femoral Vein: No evidence of thrombus. Incidentally noted nonocclusive mural calcifications involving the mid aspect of the left femoral vein (image 30). Normal compressibility, respiratory phasicity and response to augmentation. Popliteal Vein: No  evidence of thrombus. Normal compressibility, respiratory phasicity and response to augmentation. Calf Veins: No evidence of thrombus. Normal compressibility and flow on color Doppler imaging. Superficial Great Saphenous Vein: No evidence of thrombus. Normal compressibility. Other Findings: There is an at least 5.7 x 2.7 x 1.6 cm anechoic fluid collection within the left groin, likely fluid contained within the caudal aspect of previously identified moderate-to-large sized indirect inguinal hernia demonstrated on PET-CT performed 06/2023. IMPRESSION: 1. No evidence of DVT within the left lower extremity. 2. At least 5.7 cm anechoic fluid collection within the left groin likely fluid contained within the caudal aspect of previously identified moderate-to-large sized indirect inguinal hernia demonstrated on PET-CT performed 06/2023. Electronically Signed   By: Simonne Come M.D.   On: 02/08/2024 15:06   HYBRID OR IMAGING ([facility] ONLY) Result Date: 01/22/2024 There is no interpretation for this exam.  This order is for images obtained during a surgical procedure.  Please See "Surgeries" Tab for more information regarding the procedure.     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 S/p SBRT 30 Gray in 5 fractions. January 2025- [Dr.Dasher] Repeat biopsy invasive squamous cell carcinoma moderately.  # proceed with Libtayo. Labs-CBC/chemistries were reviewed with the patient.  # I discussed the mechanism of action; The goal of therapy is palliative; and length of treatments are likely ongoing/based upon the results of the scans. Discussed the potential side effects of immunotherapy including but not limited to diarrhea; skin rash; elevated LFTs/endocrine abnormalities  etc.  Again reviewed with patient/ and daughter.   # Malignant ulcer-foul-smelling no signs of superinfection at this time- s/p Wound care referral-  # Hypothyroidism - FEB 2025- TSH- 39; .  Of note patient is on Synthroid.will order on empty stomach a the facility   # Severe Anemia-question etiology- FEB 2025-  I sat 80- ferritin- 90- ACD- HOL venofer- stable.   # Cardiovascular- CHF/A.fib [ Dr.Parashoes] PVD-angioplasty of the left popliteal artery in Jan 2025- on elqiuis.  Clinically stable.  # ACP: referral to Josh re: palliative care  # DISPOSITION: # HOLD  venofer # Libtayo treatment today # Follow up in 3  week- MD; labs-cbc/cmp; TSH- Libtayo ; # Follow up in 6  week- MD; labs-cbc/cmp; TSH- Libtayo  -Dr.B  Above plan  of care was discussed with patient/family in detail.  My contact information was given to the patient/family.     Earna Coder, MD 02/15/2024 1:23 PM

## 2024-02-16 ENCOUNTER — Ambulatory Visit: Payer: PPO | Admitting: Physician Assistant

## 2024-02-16 LAB — T4: T4, Total: 5.8 ug/dL (ref 4.5–12.0)

## 2024-02-17 DIAGNOSIS — L97521 Non-pressure chronic ulcer of other part of left foot limited to breakdown of skin: Secondary | ICD-10-CM | POA: Diagnosis not present

## 2024-02-17 DIAGNOSIS — I110 Hypertensive heart disease with heart failure: Secondary | ICD-10-CM | POA: Diagnosis not present

## 2024-02-17 DIAGNOSIS — I509 Heart failure, unspecified: Secondary | ICD-10-CM | POA: Diagnosis not present

## 2024-02-17 DIAGNOSIS — I429 Cardiomyopathy, unspecified: Secondary | ICD-10-CM | POA: Diagnosis not present

## 2024-02-17 DIAGNOSIS — S81801D Unspecified open wound, right lower leg, subsequent encounter: Secondary | ICD-10-CM | POA: Diagnosis not present

## 2024-02-17 DIAGNOSIS — I70245 Atherosclerosis of native arteries of left leg with ulceration of other part of foot: Secondary | ICD-10-CM | POA: Diagnosis not present

## 2024-02-17 DIAGNOSIS — I70244 Atherosclerosis of native arteries of left leg with ulceration of heel and midfoot: Secondary | ICD-10-CM | POA: Diagnosis not present

## 2024-02-17 DIAGNOSIS — L97428 Non-pressure chronic ulcer of left heel and midfoot with other specified severity: Secondary | ICD-10-CM | POA: Diagnosis not present

## 2024-02-19 DIAGNOSIS — I70245 Atherosclerosis of native arteries of left leg with ulceration of other part of foot: Secondary | ICD-10-CM | POA: Diagnosis not present

## 2024-02-19 DIAGNOSIS — S81801D Unspecified open wound, right lower leg, subsequent encounter: Secondary | ICD-10-CM | POA: Diagnosis not present

## 2024-02-22 ENCOUNTER — Ambulatory Visit (INDEPENDENT_AMBULATORY_CARE_PROVIDER_SITE_OTHER): Payer: PPO | Admitting: Physician Assistant

## 2024-02-22 VITALS — BP 107/54 | HR 74 | Temp 98.6°F | Resp 20 | Ht 67.0 in | Wt 139.0 lb

## 2024-02-22 DIAGNOSIS — H903 Sensorineural hearing loss, bilateral: Secondary | ICD-10-CM | POA: Diagnosis not present

## 2024-02-22 DIAGNOSIS — Z85828 Personal history of other malignant neoplasm of skin: Secondary | ICD-10-CM | POA: Diagnosis not present

## 2024-02-22 DIAGNOSIS — H6123 Impacted cerumen, bilateral: Secondary | ICD-10-CM | POA: Diagnosis not present

## 2024-02-22 DIAGNOSIS — I7025 Atherosclerosis of native arteries of other extremities with ulceration: Secondary | ICD-10-CM

## 2024-02-22 NOTE — Progress Notes (Signed)
 POST OPERATIVE OFFICE NOTE    CC:  F/u for surgery  HPI:  This is a 89 y.o. male who is s/p   01/22/2024 Pre-operative Diagnosis: Left heel ulcer Post-operative diagnosis:  Same Surgeon:  Durene Cal Assistants:  Aggie Moats, PA Procedure:   #1: Left femoral endarterectomy with vein patch angioplasty                       #2: Left profundofemoral bypass graft with 6 mm dacryon Small interposition bypass graft using dacryon beginning at the origin of the profundofemoral artery down to the profunda right before it trifurcated.   The artery was thinned out and tore.                         #3: Left leg angiogram                       #4: Laser atherectomy of the left popliteal artery and tibioperoneal trunk                       #5: Angioplasty of the left posterior tibial artery and tibioperoneal trunk                       #6: Drug-coated balloon angioplasty, left popliteal artery   Pt returns today for follow up.  Pt states he is seeing improvement of the wounds.  He is walking some, but still careful to protect the heel wound from too much pressure.   He continues to be treated for recurrent metastatic squamous cell carcinoma of the right neck.  His daughter is with him.  He currently resides at an assisted living facility.  Patient/daughter confirmed DNR/DNI. DNR order signed for him to take back to ALF.     Allergies  Allergen Reactions   Cephalexin Rash   Spironolactone Nausea Only   Codeine Rash and Other (See Comments)    Can not sleep   Doxycycline Rash    Current Outpatient Medications  Medication Sig Dispense Refill   acetaminophen (TYLENOL) 325 MG tablet Take 650 mg by mouth in the morning and at bedtime.     apixaban (ELIQUIS) 2.5 MG TABS tablet Take 1 tablet (2.5 mg total) by mouth 2 (two) times daily. 60 tablet 1   aspirin EC 81 MG tablet Take 1 tablet (81 mg total) by mouth daily. Swallow whole.     cyanocobalamin (VITAMIN B12) 1000 MCG tablet Take 1,000 mcg by  mouth in the morning.     ferrous sulfate 325 (65 FE) MG EC tablet Take 325 mg by mouth 2 (two) times a week. Tuesday and Thursday in the morning     levothyroxine (SYNTHROID, LEVOTHROID) 112 MCG tablet Take 112 mcg by mouth daily.     metoprolol succinate (TOPROL-XL) 25 MG 24 hr tablet Take 25 mg by mouth daily.     simvastatin (ZOCOR) 40 MG tablet Take 40 mg by mouth daily.     torsemide (DEMADEX) 10 MG tablet Take 10 mg by mouth in the morning.     No current facility-administered medications for this visit.     ROS:  See HPI  Physical Exam:       Incision:  left groin is healing well Extremities:  heel wound is stable, dorsal foot wound is improved.   Doppler signals AT/DP/PT intact left LE Right anterior/lateral shin superficial  wound is healing    Assessment/Plan:  This is a 89 y.o. male who is s/p: Procedure:   #1: Left femoral endarterectomy with vein patch angioplasty                       #2: Left profundofemoral bypass graft with 6 mm dacryon Small interposition bypass graft using dacryon beginning at the origin of the profundofemoral artery down to the profunda right before it trifurcated.   The artery was thinned out and tore.                         #3: Left leg angiogram                       #4: Laser atherectomy of the left popliteal artery and tibioperoneal trunk                       #5: Angioplasty of the left posterior tibial artery and tibioperoneal trunk                       #6: Drug-coated balloon angioplasty, left popliteal artery  He is doing well with healing, it seems he has good wound care.  He is ambulating for short distances with assistance and using a walker. Good and improved inflow to the left LE.    He will return in 3-4 weeks for left arterial duplex and ABI's.  He will cont. ASA, Eliquis and Statin daily for maximum medical therapy.      Mosetta Pigeon PA-C [facility] [phone]   Clinic MD:  Myra Gianotti

## 2024-02-25 ENCOUNTER — Other Ambulatory Visit: Payer: Self-pay | Admitting: *Deleted

## 2024-02-25 DIAGNOSIS — I70245 Atherosclerosis of native arteries of left leg with ulceration of other part of foot: Secondary | ICD-10-CM | POA: Diagnosis not present

## 2024-02-25 DIAGNOSIS — I7025 Atherosclerosis of native arteries of other extremities with ulceration: Secondary | ICD-10-CM

## 2024-02-25 DIAGNOSIS — S81801D Unspecified open wound, right lower leg, subsequent encounter: Secondary | ICD-10-CM | POA: Diagnosis not present

## 2024-02-26 ENCOUNTER — Ambulatory Visit: Payer: PPO | Admitting: Physician Assistant

## 2024-02-26 DIAGNOSIS — B349 Viral infection, unspecified: Secondary | ICD-10-CM | POA: Diagnosis not present

## 2024-03-02 ENCOUNTER — Telehealth: Payer: Self-pay | Admitting: *Deleted

## 2024-03-02 ENCOUNTER — Ambulatory Visit: Payer: PPO | Admitting: Physician Assistant

## 2024-03-02 DIAGNOSIS — S81801D Unspecified open wound, right lower leg, subsequent encounter: Secondary | ICD-10-CM | POA: Diagnosis not present

## 2024-03-02 DIAGNOSIS — I70245 Atherosclerosis of native arteries of left leg with ulceration of other part of foot: Secondary | ICD-10-CM | POA: Diagnosis not present

## 2024-03-02 NOTE — Telephone Encounter (Signed)
 Error - duplicate

## 2024-03-02 NOTE — Telephone Encounter (Signed)
 Daughter called to see if it is going to be ok for coming next week 3/11 for treatment. Daughter says that he is on prednisone for the cough and fluid in lungs and she says that he is feeling better and she feels  if he keeps up like he is now she feels ok to come in office. I told the daughter that if he gets worse then let us know.

## 2024-03-04 ENCOUNTER — Telehealth: Payer: Self-pay | Admitting: *Deleted

## 2024-03-04 ENCOUNTER — Telehealth: Payer: Self-pay | Admitting: Internal Medicine

## 2024-03-04 NOTE — Telephone Encounter (Signed)
 Daughter has no transportation for 3/11 but they can do it on 3/10. His appt is labs, see md and infusion

## 2024-03-04 NOTE — Telephone Encounter (Signed)
 Pt daughter left vm, wants to change time on appts scheduled for Tuesday 3/11 to start at 9:30am. Please call her back to r/s appts.

## 2024-03-08 ENCOUNTER — Encounter: Payer: Self-pay | Admitting: Internal Medicine

## 2024-03-08 ENCOUNTER — Inpatient Hospital Stay: Payer: PPO | Admitting: Internal Medicine

## 2024-03-08 ENCOUNTER — Inpatient Hospital Stay: Payer: PPO | Attending: Internal Medicine

## 2024-03-08 ENCOUNTER — Inpatient Hospital Stay: Payer: PPO

## 2024-03-08 VITALS — BP 124/51 | HR 72 | Temp 97.8°F | Resp 24 | Ht 67.0 in | Wt 138.0 lb

## 2024-03-08 VITALS — BP 127/71 | HR 65 | Temp 97.4°F | Resp 19

## 2024-03-08 DIAGNOSIS — Z79620 Long term (current) use of immunosuppressive biologic: Secondary | ICD-10-CM | POA: Insufficient documentation

## 2024-03-08 DIAGNOSIS — Z5112 Encounter for antineoplastic immunotherapy: Secondary | ICD-10-CM | POA: Diagnosis not present

## 2024-03-08 DIAGNOSIS — C4442 Squamous cell carcinoma of skin of scalp and neck: Secondary | ICD-10-CM | POA: Insufficient documentation

## 2024-03-08 DIAGNOSIS — D0439 Carcinoma in situ of skin of other parts of face: Secondary | ICD-10-CM | POA: Insufficient documentation

## 2024-03-08 LAB — CMP (CANCER CENTER ONLY)
ALT: 13 U/L (ref 0–44)
AST: 27 U/L (ref 15–41)
Albumin: 3.3 g/dL — ABNORMAL LOW (ref 3.5–5.0)
Alkaline Phosphatase: 50 U/L (ref 38–126)
Anion gap: 8 (ref 5–15)
BUN: 15 mg/dL (ref 8–23)
CO2: 26 mmol/L (ref 22–32)
Calcium: 8.5 mg/dL — ABNORMAL LOW (ref 8.9–10.3)
Chloride: 102 mmol/L (ref 98–111)
Creatinine: 1.06 mg/dL (ref 0.61–1.24)
GFR, Estimated: >60 mL/min (ref >60)
Glucose, Bld: 114 mg/dL — ABNORMAL HIGH (ref 70–99)
Potassium: 3.8 mmol/L (ref 3.5–5.1)
Sodium: 136 mmol/L (ref 135–145)
Total Bilirubin: 1 mg/dL (ref 0.0–1.2)
Total Protein: 7.2 g/dL (ref 6.5–8.1)

## 2024-03-08 LAB — CBC WITH DIFFERENTIAL (CANCER CENTER ONLY)
Abs Immature Granulocytes: 0.24 10*3/uL — ABNORMAL HIGH (ref 0.00–0.07)
Basophils Absolute: 0.1 10*3/uL (ref 0.0–0.1)
Basophils Relative: 0 %
Eosinophils Absolute: 0.1 10*3/uL (ref 0.0–0.5)
Eosinophils Relative: 1 %
HCT: 27.5 % — ABNORMAL LOW (ref 39.0–52.0)
Hemoglobin: 8.9 g/dL — ABNORMAL LOW (ref 13.0–17.0)
Immature Granulocytes: 2 %
Lymphocytes Relative: 11 %
Lymphs Abs: 1.5 10*3/uL (ref 0.7–4.0)
MCH: 30.7 pg (ref 26.0–34.0)
MCHC: 32.4 g/dL (ref 30.0–36.0)
MCV: 94.8 fL (ref 80.0–100.0)
Monocytes Absolute: 1.8 10*3/uL — ABNORMAL HIGH (ref 0.1–1.0)
Monocytes Relative: 13 %
Neutro Abs: 10.7 10*3/uL — ABNORMAL HIGH (ref 1.7–7.7)
Neutrophils Relative %: 73 %
Platelet Count: 386 10*3/uL (ref 150–400)
RBC: 2.9 MIL/uL — ABNORMAL LOW (ref 4.22–5.81)
RDW: 21.3 % — ABNORMAL HIGH (ref 11.5–15.5)
WBC Count: 14.4 10*3/uL — ABNORMAL HIGH (ref 4.0–10.5)
nRBC: 0 % (ref 0.0–0.2)

## 2024-03-08 LAB — TSH: TSH: 38.096 u[IU]/mL — ABNORMAL HIGH (ref 0.350–4.500)

## 2024-03-08 MED ORDER — SODIUM CHLORIDE 0.9 % IV SOLN
INTRAVENOUS | Status: DC
  Filled 2024-03-08: qty 250

## 2024-03-08 MED ORDER — SODIUM CHLORIDE 0.9 % IV SOLN
350.0000 mg | Freq: Once | INTRAVENOUS | Status: AC
  Administered 2024-03-08: 350 mg via INTRAVENOUS
  Filled 2024-03-08: qty 7

## 2024-03-08 NOTE — Patient Instructions (Signed)
 [facility] - A DEPT OF [facility]  Discharge Instructions: Thank you for choosing [facility] to provide your oncology and hematology care.  If you have a lab appointment with the Cancer Center, please go directly to the Cancer Center and check in at the registration area.  Wear comfortable clothing and clothing appropriate for easy access to any Portacath or PICC line.   We strive to give you quality time with your provider. You may need to reschedule your appointment if you arrive late (15 or more minutes).  Arriving late affects you and other patients whose appointments are after yours.  Also, if you miss three or more appointments without notifying the office, you may be dismissed from the clinic at the provider's discretion.      For prescription refill requests, have your pharmacy contact our office and allow 72 hours for refills to be completed.    Today you received the following chemotherapy and/or immunotherapy agents libtayo      To help prevent nausea and vomiting after your treatment, we encourage you to take your nausea medication as directed.  BELOW ARE SYMPTOMS THAT SHOULD BE REPORTED IMMEDIATELY: *FEVER GREATER THAN 100.4 F (38 C) OR HIGHER *CHILLS OR SWEATING *NAUSEA AND VOMITING THAT IS NOT CONTROLLED WITH YOUR NAUSEA MEDICATION *UNUSUAL SHORTNESS OF BREATH *UNUSUAL BRUISING OR BLEEDING *URINARY PROBLEMS (pain or burning when urinating, or frequent urination) *BOWEL PROBLEMS (unusual diarrhea, constipation, pain near the anus) TENDERNESS IN MOUTH AND THROAT WITH OR WITHOUT PRESENCE OF ULCERS (sore throat, sores in mouth, or a toothache) UNUSUAL RASH, SWELLING OR PAIN  UNUSUAL VAGINAL DISCHARGE OR ITCHING   Items with * indicate a potential emergency and should be followed up as soon as possible or go to the Emergency Department if any problems should occur.  Please show the CHEMOTHERAPY ALERT CARD or IMMUNOTHERAPY  ALERT CARD at check-in to the Emergency Department and triage nurse.  Should you have questions after your visit or need to cancel or reschedule your appointment, please contact [facility] - A DEPT OF [facility]  [phone] and follow the prompts.  Office hours are 8:00 a.m. to 4:30 p.m. Monday - Friday. Please note that voicemails left after 4:00 p.m. may not be returned until the following business day.  We are closed weekends and major holidays. You have access to a nurse at all times for urgent questions. Please call the main number to the clinic [phone] and follow the prompts.  For any non-urgent questions, you may also contact your provider using MyChart. We now offer e-Visits for anyone 71 and older to request care online for non-urgent symptoms. For details visit [email or web address].   Also download the MyChart app! Go to the app store, search "MyChart", open the app, select [facility], and log in with your MyChart username and password.

## 2024-03-08 NOTE — Assessment & Plan Note (Addendum)
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 S/p SBRT 30 Gray in 5 fractions. January 2025- [Dr.Dasher] Repeat biopsy invasive squamous cell carcinoma moderately.  Declined Radiation. Currently on Libtayo.  # proceed with Libtayo #2. Labs-CBC/chemistries were reviewed with the patient. Partial response noted.   # Malignant ulcer-foul-smelling no signs of superinfection at this time- s/p Wound care referral- improvement noted.   # Hypothyroidism - FEB 2025- TSH- 39; .  Of note patient is on Synthroid.will order on empty stomach a the facility - will monitor closley.   # Severe Anemia-question etiology- FEB 2025-  I sat 80- ferritin- 90- Anemia of Chronic Disease- HOLD venofer- stable.   # Cardiovascular- CHF/A.fib [ Dr.Parashoes] PVD-angioplasty of the left popliteal artery in Jan 2025- on elqiuis.  Clinically stable.  # ACP: referral to [facility] re: palliative care  # DISPOSITION: Monday pref # Libtayo treatment today # Follow up in 3  week- MD; labs-cbc/cmp; TSH-ADD vit D 25-OH- Libtayo ; # Follow up in 6  week- MD; labs-cbc/cmp; TSH- Libtayo  -Dr.B

## 2024-03-08 NOTE — Progress Notes (Signed)
 Had the flu 2 weeks ago, still has cough.

## 2024-03-08 NOTE — Progress Notes (Signed)
 [facility] CONSULT NOTE  Patient Care Team: [name], MD as PCP - General (Internal Medicine) Earna Coder, MD as Consulting Physician (Oncology)  CHIEF COMPLAINTS/PURPOSE OF CONSULTATION: SCC     JULY 2024- 1. 11 mm subcutaneous nodule in the right neck near the mandibular angle is hypermetabolic. 2. No other suspicious hypermetabolism in the neck, chest, abdomen, or pelvis. 3. Cholelithiasis. 4. Left groin hernia contains a segment of sigmoid colon without complicating features. 5.  Aortic Atherosclerosis (ICD10-I70.    JULY 2024-- Lymph node, biopsy, Right neck. Submandibular mass - METASTATIC SQUAMOUS CELL CARCINOMA  # Patient had been treated for this in the past with surgery at [facility] in 2023.   # 2023- for squamous cell carcinoma of the skin with adenopathy in the right neck- s/p RT; recurrence-  # s/p LB Bx- JULY 2024-A nodule in the right subdigastric region which was biopsy positive again for squamous cell carcinoma. S/p SBRT 30 Gray in 5 fractions.   # January 2025- [Dr.Dasher] Repeat biopsy invasive squamous cell carcinoma moderately- FEB 18th, 2025- START CEPI  HISTORY OF PRESENTING ILLNESS: Patient ambulating-in wheel chair. Accompanied by daughter, [name] [name] 89 y.o.  male pleasant patient with Hx of SCC-recurrent -right central mandibular cheek is here for follow-up and proceed with immunotherapy.   Had the flu 2 weeks ago s/p Tamiflu. S/p prednisone- current off prednisone.  Patient still has mild cough   S/p evaluation with wound care. Significant improvement in foul smelling.   At the last visit noted to have elevated thyroid function.  Of note patient is on Synthroid- currently taking on empty stomach in AM.   Review of Systems  Constitutional:  Positive for malaise/fatigue and weight loss. Negative for chills, diaphoresis and fever.  HENT:  Negative for nosebleeds and sore throat.   Eyes:  Negative for double  vision.  Respiratory:  Negative for cough, hemoptysis, sputum production, shortness of breath and wheezing.   Cardiovascular:  Negative for chest pain, palpitations, orthopnea and leg swelling.  Gastrointestinal:  Negative for abdominal pain, blood in stool, constipation, diarrhea, heartburn, melena, nausea and vomiting.  Genitourinary:  Negative for dysuria, frequency and urgency.  Musculoskeletal:  Positive for back pain and joint pain.  Skin: Negative.  Negative for itching and rash.  Neurological:  Negative for dizziness, tingling, focal weakness, weakness and headaches.  Endo/Heme/Allergies:  Does not bruise/bleed easily.  Psychiatric/Behavioral:  Negative for depression. The patient is not nervous/anxious and does not have insomnia.     MEDICAL HISTORY:  Past Medical History:  Diagnosis Date   A-fib [facility])    a.) CHA2DS2VASc = 5 (age x 2, CHF, HTN, previous MI/vascular disease history);  b.) s/p DCCV 03/05/1993; c.) rate/rhythm maintained on oral metoprolol succinate; chronically anticoagulated with dose reduced apixaban   Aortic atherosclerosis (HCC)    B12 deficiency    CHF (congestive heart failure) (HCC)    a.) TTE 11/05/12: EF 25-35, dist sep AK, mild MR; b.) TTE 05/10/14: EF 30, mild LVH, mod BAE, mild AR/PR, mod MR/TR; c.) TTE 03/12/20: EF 20, sev glob HK, mod BAE, mild LAE, mod RAE, triv PR, mild AR, sev MR/TR; d.) TTE 08/21/21: EF 30, sev glob HK, apical dyskinesis, mild LVH, mod BAE, triv AR/PR, mod MR, sev TR; e.) TTE 09/25/23: EF 35-40,  diff AK/HK, G2DD, sev BAE, RVE, RVSP 66.3, mild MR, AoV calc   Cor pulmonale (HCC)    Coronary artery disease 02/21/1993   a.) LHC  02/21/1993 : 95% pLAD, 75% OM1, 50% OM3, 25-50% pRCA, 50% large anterolateral lesion --> transfer to [facility] for CABG; b.) s/p 2v CABG 02/26/1993 (LIMA-LAD, SVG-OM1)   Family history of adverse reaction to anesthesia    a.) PONV in 1st degree relative (daughter)   GERD (gastroesophageal reflux disease)    History of  [place] spotted fever    HLD (hyperlipidemia)    HTN (hypertension), benign 04/25/2014   Hypothyroidism    Iron deficiency anemia    On apixaban therapy    Posterolateral myocardial infarction (HCC) 02/21/1993   LHC 02/21/1993 : 95% pLAD, 75% OM1, 50% OM3, 25-50% pRCA, 50% large anterolateral lesion --> transfer to [facility] for CABG; b.) s/p 2v CABG 02/26/1993 (LIMA-LAD, SVG-OM1)   Pulmonary HTN (HCC)    a.) TTE 05/10/2014: RVSP 48.6; b.) TTE 03/12/2020: RVSP 73.3; c.) TTE  09/25/2023: RVSP 66.3   PVD (peripheral vascular disease) (HCC)    S/P CABG x 2 02/26/1993   a.) LIMA-LAD, SVG-OM1 --> complicated by atrial flutter on POD4 --> Tx'd with digoxin + procainamide with no resolution. POD5 diltiazem gtt added with no improvement. Ultimately required DCCV on 03/05/1993.   Squamous cell carcinoma of skin 11/18/2021   right ear and mandible, EDC   Squamous cell carcinoma of skin 03/18/2022   right mandible and ear/refer for Mohs/ Dr. Adriana Simas has referred pt to Dr. Nedra Hai in otolaryngology excised by Dr. Nedra Hai 05/03/22, Radiation with Dr. Rushie Chestnut   Type 2 diabetes mellitus with peripheral angiopathy (HCC) 11/10/2018    SURGICAL HISTORY: Past Surgical History:  Procedure Laterality Date   ANGIOPLASTY Left 01/22/2024   Procedure: BALLOON ANGIOPLASTY OF TIBIAL PERONEAL TRUNK AND POSTERIOR TBIAL ARTERY AND DRUG COATED BALLOON ANGIOPLASTY OF POPLITEAL ARTERY;  Surgeon: Nada Libman, MD;  Location: [facility] OR;  Service: Vascular;  Laterality: Left;   cancer removal   2023   below the right base of ear at [facility]   COLONOSCOPY     CORONARY ARTERY BYPASS GRAFT N/A 02/26/1993   Procedure: CORONARY ARTERY BYPASS GRAFT; Location: [facility]; Surgeon: Marshell Garfinkel, MD   ENDARTERECTOMY FEMORAL Left 01/22/2024   Procedure: LEFT COMMON FEMORAL ENDARTERECTOMY WITH VEIN PATCH;  Surgeon: Nada Libman, MD;  Location: [facility] OR;  Service: Vascular;  Laterality: Left;   FEMORAL-FEMORAL BYPASS GRAFT Left 01/22/2024   Procedure:  BYPASS GRAFT LEFT COMMON FEMORAL TO PROFUNDA  ARTERY USING DACRON GRAFT;  Surgeon: Nada Libman, MD;  Location: [facility] OR;  Service: Vascular;  Laterality: Left;   HERNIA REPAIR     LOWER EXTREMITY ANGIOGRAPHY Left 08/30/2021   Procedure: LOWER EXTREMITY ANGIOGRAPHY;  Surgeon: Renford Dills, MD;  Location: [facility] INVASIVE CV LAB;  Service: Cardiovascular;  Laterality: Left;   LOWER EXTREMITY ANGIOGRAPHY Left 11/10/2023   Procedure: Lower Extremity Angiography;  Surgeon: Renford Dills, MD;  Location: [facility] INVASIVE CV LAB;  Service: Cardiovascular;  Laterality: Left;    SOCIAL HISTORY: Social History   Socioeconomic History   Marital status: Widowed    Spouse name: Not on file   Number of children: 2   Years of education: Not on file   Highest education level: Not on file  Occupational History   Not on file  Tobacco Use   Smoking status: Never   Smokeless tobacco: Never  Vaping Use   Vaping status: Never Used  Substance and Sexual Activity   Alcohol use: No   Drug use: No   Sexual activity: Not on file  Other Topics Concern  Not on file  Social History Narrative   Lives by himself: daughter penny lives in [place]. Daughter [name] lives in town   Social Drivers of Health   Financial Resource Strain: Low Risk  (09/22/2023)   Received from [facility]   Overall Financial Resource Strain (CARDIA)    Difficulty of Paying Living Expenses: Not hard at all  Food Insecurity: No Food Insecurity (02/09/2024)   Hunger Vital Sign    Worried About Running Out of Food in the Last Year: Never true    Ran Out of Food in the Last Year: Never true  Transportation Needs: No Transportation Needs (02/09/2024)   PRAPARE - Administrator, Civil Service (Medical): No    Lack of Transportation (Non-Medical): No  Physical Activity: Not on file  Stress: Not on file  Social Connections: Unknown (01/22/2024)   Social Connection and Isolation Panel [NHANES]     Frequency of Communication with Friends and Family: Patient unable to answer    Frequency of Social Gatherings with Friends and Family: Patient unable to answer    Attends Religious Services: Not on file    Active Member of Clubs or Organizations: Not on file    Attends Banker Meetings: Not on file    Marital Status: Patient unable to answer  Intimate Partner Violence: Not At Risk (02/09/2024)   Humiliation, Afraid, Rape, and Kick questionnaire    Fear of Current or Ex-Partner: No    Emotionally Abused: No    Physically Abused: No    Sexually Abused: No    FAMILY HISTORY: Family History  Problem Relation Age of Onset   Hypertension Mother    Diabetes Brother    Stroke Brother    Heart attack Brother    Esophageal cancer Daughter    Colon cancer Daughter     ALLERGIES:  is allergic to cephalexin, spironolactone, codeine, and doxycycline.  MEDICATIONS:  Current Outpatient Medications  Medication Sig Dispense Refill   acetaminophen (TYLENOL) 325 MG tablet Take 650 mg by mouth in the morning and at bedtime.     apixaban (ELIQUIS) 2.5 MG TABS tablet Take 1 tablet (2.5 mg total) by mouth 2 (two) times daily. 60 tablet 1   aspirin EC 81 MG tablet Take 1 tablet (81 mg total) by mouth daily. Swallow whole.     cyanocobalamin (VITAMIN B12) 1000 MCG tablet Take 1,000 mcg by mouth in the morning.     ferrous sulfate 325 (65 FE) MG EC tablet Take 325 mg by mouth 2 (two) times a week. Tuesday and Thursday in the morning     levothyroxine (SYNTHROID, LEVOTHROID) 112 MCG tablet Take 112 mcg by mouth daily.     metoprolol succinate (TOPROL-XL) 25 MG 24 hr tablet Take 25 mg by mouth daily.     simvastatin (ZOCOR) 40 MG tablet Take 40 mg by mouth daily.     torsemide (DEMADEX) 10 MG tablet Take 10 mg by mouth in the morning.     No current facility-administered medications for this visit.    PHYSICAL EXAMINATION:   Vitals:   03/08/24 0848  BP: (!) 124/51  Pulse: 72  Resp:  (!) 24  Temp: 97.8 F (36.6 C)  SpO2: 96%     Filed Weights   03/08/24 0848  Weight: 138 lb (62.6 kg)     Necrotic looking ulcer on right side.     Physical Exam Vitals and nursing note reviewed.  HENT:  Head: Normocephalic and atraumatic.     Mouth/Throat:     Pharynx: Oropharynx is clear.  Eyes:     Extraocular Movements: Extraocular movements intact.     Pupils: Pupils are equal, round, and reactive to light.  Cardiovascular:     Rate and Rhythm: Normal rate and regular rhythm.  Pulmonary:     Comments: Decreased breath sounds bilaterally.  Abdominal:     Palpations: Abdomen is soft.  Musculoskeletal:        General: Normal range of motion.     Cervical back: Normal range of motion.  Skin:    General: Skin is warm.  Neurological:     General: No focal deficit present.     Mental Status: He is alert and oriented to person, place, and time.  Psychiatric:        Behavior: Behavior normal.        Judgment: Judgment normal.     LABORATORY DATA:  I have reviewed the data as listed Lab Results  Component Value Date   WBC 14.4 (H) 03/08/2024   HGB 8.9 (L) 03/08/2024   HCT 27.5 (L) 03/08/2024   MCV 94.8 03/08/2024   PLT 386 03/08/2024   Recent Labs    09/28/23 0958 09/29/23 1127 02/09/24 1207 02/15/24 0852 03/08/24 0839  NA  --    < > 138 137 136  K  --    < > 3.8 3.5 3.8  CL  --    < > 104 101 102  CO2  --    < > 25 25 26   GLUCOSE  --    < > 119* 73 114*  BUN  --    < > 19 19 15   CREATININE  --    < > 1.21 1.09 1.06  CALCIUM  --    < > 8.5* 8.8* 8.5*  GFRNONAA  --    < > 56* >60 >60  PROT 6.0*   < > 7.0 8.1 7.2  ALBUMIN 2.5*   < > 3.4* 3.9 3.3*  AST 38   < > 21 21 27   ALT 34   < > 9 10 13   ALKPHOS 74   < > 58 61 50  BILITOT 0.9   < > 0.8 0.7 1.0  BILIDIR 0.3*  --   --   --   --   IBILI 0.6  --   --   --   --    < > = values in this interval not displayed.    RADIOGRAPHIC STUDIES: I have personally reviewed the radiological images as  listed and agreed with the findings in the report. US Venous Img Lower Unilateral Left (DVT) Result Date: 02/08/2024 CLINICAL DATA:  Left lower extremity edema for the past week. Evaluate for DVT. EXAM: LEFT LOWER EXTREMITY VENOUS DOPPLER ULTRASOUND TECHNIQUE: Gray-scale sonography with graded compression, as well as color Doppler and duplex ultrasound were performed to evaluate the lower extremity deep venous systems from the level of the common femoral vein and including the common femoral, femoral, profunda femoral, popliteal and calf veins including the posterior tibial, peroneal and gastrocnemius veins when visible. The superficial great saphenous vein was also interrogated. Spectral Doppler was utilized to evaluate flow at rest and with distal augmentation maneuvers in the common femoral, femoral and popliteal veins. COMPARISON:  Left lower extremity venous Doppler ultrasound-09/23/2023 (negative) PET-CT-07/24/2023 FINDINGS: Contralateral Common Femoral Vein: Respiratory phasicity is normal and symmetric with the symptomatic side. No evidence of thrombus. Normal  compressibility. Common Femoral Vein: No evidence of thrombus. Normal compressibility, respiratory phasicity and response to augmentation. Saphenofemoral Junction: No evidence of thrombus. Normal compressibility and flow on color Doppler imaging. Profunda Femoral Vein: No evidence of thrombus. Normal compressibility and flow on color Doppler imaging. Femoral Vein: No evidence of thrombus. Incidentally noted nonocclusive mural calcifications involving the mid aspect of the left femoral vein (image 30). Normal compressibility, respiratory phasicity and response to augmentation. Popliteal Vein: No evidence of thrombus. Normal compressibility, respiratory phasicity and response to augmentation. Calf Veins: No evidence of thrombus. Normal compressibility and flow on color Doppler imaging. Superficial Great Saphenous Vein: No evidence of thrombus. Normal  compressibility. Other Findings: There is an at least 5.7 x 2.7 x 1.6 cm anechoic fluid collection within the left groin, likely fluid contained within the caudal aspect of previously identified moderate-to-large sized indirect inguinal hernia demonstrated on PET-CT performed 06/2023. IMPRESSION: 1. No evidence of DVT within the left lower extremity. 2. At least 5.7 cm anechoic fluid collection within the left groin likely fluid contained within the caudal aspect of previously identified moderate-to-large sized indirect inguinal hernia demonstrated on PET-CT performed 06/2023. Electronically Signed   By: Simonne Come M.D.   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 S/p SBRT 30 Gray in 5 fractions. January 2025- [Dr.Dasher] Repeat biopsy invasive squamous cell carcinoma moderately.  Declined Radiation. Currently on Libtayo.  # proceed with Libtayo #2. Labs-CBC/chemistries were reviewed with the patient. Partial response noted.   # Malignant ulcer-foul-smelling no signs of superinfection at this time- s/p Wound care referral- improvement noted.   # Hypothyroidism - FEB 2025- TSH- 39; .  Of note patient is on Synthroid.will order on empty stomach a the facility - will monitor closley.   # Severe Anemia-question etiology- FEB 2025-  I sat 80- ferritin- 90- Anemia of Chronic Disease- HOLD venofer- stable.   # Cardiovascular- CHF/A.fib [ Dr.Parashoes] PVD-angioplasty of the left popliteal artery in Jan 2025- on elqiuis.  Clinically stable.  # ACP: referral to [facility] re: palliative care  # DISPOSITION: Monday pref # Libtayo treatment today # Follow up in 3  week- MD; labs-cbc/cmp; TSH-ADD vit D 25-OH- Libtayo ; # Follow up in 6  week-  MD; labs-cbc/cmp; TSH- Libtayo  -Dr.B   Above plan of care was discussed with patient/family in detail.  My contact information was given to the patient/family.     Earna Coder, MD 03/08/2024 9:44 AM

## 2024-03-09 ENCOUNTER — Telehealth: Payer: Self-pay | Admitting: *Deleted

## 2024-03-09 ENCOUNTER — Encounter: Payer: Self-pay | Admitting: *Deleted

## 2024-03-09 ENCOUNTER — Telehealth: Payer: Self-pay

## 2024-03-09 MED ORDER — ONDANSETRON HCL 8 MG PO TABS: 8.0000 mg | ORAL_TABLET | Freq: Three times a day (TID) | ORAL | 1 refills | Status: DC | PRN

## 2024-03-09 NOTE — Telephone Encounter (Signed)
 [name] [name], patients daughter, called stating [name] [name] voiced that he "feels sick on his stomach". Patient has not vomited yet. Patient received Libtayo infusion yesterday. Patient not currently prescribed antinausea medication. [name] [name] denies any other symptoms at this time. If medication sent, she would like it sent to [facility] in [place], therefore they can pick it up. Patient will also need authorization for his residence to receive medication. Please advise.

## 2024-03-09 NOTE — Telephone Encounter (Signed)
 Ondansetron sent into [facility] in [place].

## 2024-03-09 NOTE — Telephone Encounter (Signed)
 Facility called to verify patients next appt. Informed her his next appt will be on 04/07/2024 @10 :30 am. Facility verbalized understanding.

## 2024-03-09 NOTE — Telephone Encounter (Signed)
 Genia Del, daughter, to let her know ondansetron was called into [facility]. She said that was incorrect. She wants it called into [facility] on [address] in [place]. [facility] [place] [facility]. Will send to [facility] in [place]. Will fax notification of new medicine to [facility].

## 2024-03-10 DIAGNOSIS — S81801D Unspecified open wound, right lower leg, subsequent encounter: Secondary | ICD-10-CM | POA: Diagnosis not present

## 2024-03-10 DIAGNOSIS — I70245 Atherosclerosis of native arteries of left leg with ulceration of other part of foot: Secondary | ICD-10-CM | POA: Diagnosis not present

## 2024-03-11 ENCOUNTER — Telehealth: Payer: Self-pay | Admitting: *Deleted

## 2024-03-11 ENCOUNTER — Encounter: Payer: Self-pay | Admitting: *Deleted

## 2024-03-11 NOTE — Telephone Encounter (Signed)
 I asked [name] if he is taking the zofran and is it working. She says that she does not know if he is taking the med. If not I suggest to tell staff that the patient needs to have the zofran every 8 hours until he can feel better with nausea and vomiting. He has pedialyte and gatorade. He is not drinking very much. [name] will tell the staff because the pt. Never asks for anything. Wand tell them. If he does not get better then we can have him a [facility] appt if they need it. [name] will let us know for Monday if she needs to

## 2024-03-14 ENCOUNTER — Inpatient Hospital Stay
Admission: EM | Admit: 2024-03-14 | Discharge: 2024-04-04 | DRG: 329 | Disposition: A | Discharge status: Skilled Nursing Facility | Source: Skilled Nursing Facility | Attending: Internal Medicine | Admitting: Internal Medicine

## 2024-03-14 ENCOUNTER — Encounter: Payer: Self-pay | Admitting: Emergency Medicine

## 2024-03-14 DIAGNOSIS — I10 Essential (primary) hypertension: Secondary | ICD-10-CM | POA: Diagnosis present

## 2024-03-14 DIAGNOSIS — E8729 Other acidosis: Secondary | ICD-10-CM | POA: Diagnosis not present

## 2024-03-14 DIAGNOSIS — K56609 Unspecified intestinal obstruction, unspecified as to partial versus complete obstruction: Secondary | ICD-10-CM | POA: Diagnosis present

## 2024-03-14 DIAGNOSIS — E119 Type 2 diabetes mellitus without complications: Secondary | ICD-10-CM | POA: Diagnosis not present

## 2024-03-14 DIAGNOSIS — R1030 Lower abdominal pain, unspecified: Secondary | ICD-10-CM | POA: Diagnosis not present

## 2024-03-14 DIAGNOSIS — K562 Volvulus: Secondary | ICD-10-CM | POA: Diagnosis not present

## 2024-03-14 DIAGNOSIS — K7460 Unspecified cirrhosis of liver: Secondary | ICD-10-CM | POA: Diagnosis not present

## 2024-03-14 DIAGNOSIS — I70201 Unspecified atherosclerosis of native arteries of extremities, right leg: Secondary | ICD-10-CM | POA: Diagnosis not present

## 2024-03-14 DIAGNOSIS — Z881 Allergy status to other antibiotic agents status: Secondary | ICD-10-CM

## 2024-03-14 DIAGNOSIS — Z515 Encounter for palliative care: Secondary | ICD-10-CM | POA: Diagnosis not present

## 2024-03-14 DIAGNOSIS — Z79890 Hormone replacement therapy (postmenopausal): Secondary | ICD-10-CM

## 2024-03-14 DIAGNOSIS — E871 Hypo-osmolality and hyponatremia: Secondary | ICD-10-CM | POA: Diagnosis present

## 2024-03-14 DIAGNOSIS — K567 Ileus, unspecified: Secondary | ICD-10-CM | POA: Diagnosis not present

## 2024-03-14 DIAGNOSIS — Z79899 Other long term (current) drug therapy: Secondary | ICD-10-CM

## 2024-03-14 DIAGNOSIS — E1151 Type 2 diabetes mellitus with diabetic peripheral angiopathy without gangrene: Secondary | ICD-10-CM | POA: Diagnosis present

## 2024-03-14 DIAGNOSIS — K683 Retroperitoneal hematoma: Secondary | ICD-10-CM | POA: Diagnosis not present

## 2024-03-14 DIAGNOSIS — Z751 Person awaiting admission to adequate facility elsewhere: Secondary | ICD-10-CM

## 2024-03-14 DIAGNOSIS — J90 Pleural effusion, not elsewhere classified: Secondary | ICD-10-CM | POA: Diagnosis not present

## 2024-03-14 DIAGNOSIS — R112 Nausea with vomiting, unspecified: Secondary | ICD-10-CM | POA: Diagnosis not present

## 2024-03-14 DIAGNOSIS — R140 Abdominal distension (gaseous): Secondary | ICD-10-CM | POA: Diagnosis not present

## 2024-03-14 DIAGNOSIS — I739 Peripheral vascular disease, unspecified: Secondary | ICD-10-CM

## 2024-03-14 DIAGNOSIS — K219 Gastro-esophageal reflux disease without esophagitis: Secondary | ICD-10-CM | POA: Diagnosis present

## 2024-03-14 DIAGNOSIS — R571 Hypovolemic shock: Secondary | ICD-10-CM | POA: Diagnosis not present

## 2024-03-14 DIAGNOSIS — J9811 Atelectasis: Secondary | ICD-10-CM | POA: Diagnosis not present

## 2024-03-14 DIAGNOSIS — Z85828 Personal history of other malignant neoplasm of skin: Secondary | ICD-10-CM

## 2024-03-14 DIAGNOSIS — I2510 Atherosclerotic heart disease of native coronary artery without angina pectoris: Secondary | ICD-10-CM | POA: Diagnosis present

## 2024-03-14 DIAGNOSIS — I110 Hypertensive heart disease with heart failure: Secondary | ICD-10-CM | POA: Diagnosis present

## 2024-03-14 DIAGNOSIS — I517 Cardiomegaly: Secondary | ICD-10-CM | POA: Diagnosis not present

## 2024-03-14 DIAGNOSIS — Z7901 Long term (current) use of anticoagulants: Secondary | ICD-10-CM

## 2024-03-14 DIAGNOSIS — E43 Unspecified severe protein-calorie malnutrition: Secondary | ICD-10-CM | POA: Diagnosis present

## 2024-03-14 DIAGNOSIS — E876 Hypokalemia: Secondary | ICD-10-CM | POA: Diagnosis present

## 2024-03-14 DIAGNOSIS — Z7982 Long term (current) use of aspirin: Secondary | ICD-10-CM

## 2024-03-14 DIAGNOSIS — Z951 Presence of aortocoronary bypass graft: Secondary | ICD-10-CM

## 2024-03-14 DIAGNOSIS — E785 Hyperlipidemia, unspecified: Secondary | ICD-10-CM | POA: Diagnosis present

## 2024-03-14 DIAGNOSIS — E11621 Type 2 diabetes mellitus with foot ulcer: Secondary | ICD-10-CM | POA: Diagnosis present

## 2024-03-14 DIAGNOSIS — G4733 Obstructive sleep apnea (adult) (pediatric): Secondary | ICD-10-CM | POA: Diagnosis present

## 2024-03-14 DIAGNOSIS — K559 Vascular disorder of intestine, unspecified: Secondary | ICD-10-CM | POA: Diagnosis not present

## 2024-03-14 DIAGNOSIS — I878 Other specified disorders of veins: Secondary | ICD-10-CM | POA: Diagnosis not present

## 2024-03-14 DIAGNOSIS — Z6821 Body mass index (BMI) 21.0-21.9, adult: Secondary | ICD-10-CM

## 2024-03-14 DIAGNOSIS — D62 Acute posthemorrhagic anemia: Secondary | ICD-10-CM | POA: Diagnosis not present

## 2024-03-14 DIAGNOSIS — I5022 Chronic systolic (congestive) heart failure: Secondary | ICD-10-CM | POA: Diagnosis present

## 2024-03-14 DIAGNOSIS — R918 Other nonspecific abnormal finding of lung field: Secondary | ICD-10-CM | POA: Diagnosis not present

## 2024-03-14 DIAGNOSIS — Z888 Allergy status to other drugs, medicaments and biological substances status: Secondary | ICD-10-CM

## 2024-03-14 DIAGNOSIS — K55019 Acute (reversible) ischemia of small intestine, extent unspecified: Secondary | ICD-10-CM | POA: Diagnosis not present

## 2024-03-14 DIAGNOSIS — Z885 Allergy status to narcotic agent status: Secondary | ICD-10-CM

## 2024-03-14 DIAGNOSIS — K8020 Calculus of gallbladder without cholecystitis without obstruction: Secondary | ICD-10-CM | POA: Diagnosis not present

## 2024-03-14 DIAGNOSIS — R0602 Shortness of breath: Secondary | ICD-10-CM | POA: Diagnosis not present

## 2024-03-14 DIAGNOSIS — E872 Acidosis, unspecified: Secondary | ICD-10-CM | POA: Diagnosis present

## 2024-03-14 DIAGNOSIS — F0390 Unspecified dementia without behavioral disturbance: Secondary | ICD-10-CM | POA: Diagnosis present

## 2024-03-14 DIAGNOSIS — E039 Hypothyroidism, unspecified: Secondary | ICD-10-CM | POA: Diagnosis present

## 2024-03-14 DIAGNOSIS — L97429 Non-pressure chronic ulcer of left heel and midfoot with unspecified severity: Secondary | ICD-10-CM | POA: Diagnosis present

## 2024-03-14 DIAGNOSIS — Z66 Do not resuscitate: Secondary | ICD-10-CM | POA: Diagnosis present

## 2024-03-14 DIAGNOSIS — Z8249 Family history of ischemic heart disease and other diseases of the circulatory system: Secondary | ICD-10-CM

## 2024-03-14 DIAGNOSIS — Z9862 Peripheral vascular angioplasty status: Secondary | ICD-10-CM

## 2024-03-14 DIAGNOSIS — D72829 Elevated white blood cell count, unspecified: Secondary | ICD-10-CM | POA: Diagnosis not present

## 2024-03-14 DIAGNOSIS — D0439 Carcinoma in situ of skin of other parts of face: Secondary | ICD-10-CM | POA: Diagnosis present

## 2024-03-14 DIAGNOSIS — I255 Ischemic cardiomyopathy: Secondary | ICD-10-CM | POA: Diagnosis present

## 2024-03-14 DIAGNOSIS — R578 Other shock: Secondary | ICD-10-CM | POA: Diagnosis not present

## 2024-03-14 DIAGNOSIS — I4891 Unspecified atrial fibrillation: Secondary | ICD-10-CM | POA: Diagnosis present

## 2024-03-14 DIAGNOSIS — Z833 Family history of diabetes mellitus: Secondary | ICD-10-CM

## 2024-03-15 ENCOUNTER — Encounter: Payer: Self-pay | Admitting: Internal Medicine

## 2024-03-15 ENCOUNTER — Other Ambulatory Visit: Payer: Self-pay

## 2024-03-15 ENCOUNTER — Emergency Department

## 2024-03-15 ENCOUNTER — Encounter
Admission: EM | Disposition: A | Discharge status: Skilled Nursing Facility | Payer: Self-pay | Source: Skilled Nursing Facility | Attending: Internal Medicine

## 2024-03-15 ENCOUNTER — Inpatient Hospital Stay: Admitting: Anesthesiology

## 2024-03-15 DIAGNOSIS — D62 Acute posthemorrhagic anemia: Secondary | ICD-10-CM | POA: Diagnosis not present

## 2024-03-15 DIAGNOSIS — J90 Pleural effusion, not elsewhere classified: Secondary | ICD-10-CM | POA: Diagnosis not present

## 2024-03-15 DIAGNOSIS — K56609 Unspecified intestinal obstruction, unspecified as to partial versus complete obstruction: Secondary | ICD-10-CM | POA: Diagnosis not present

## 2024-03-15 DIAGNOSIS — K219 Gastro-esophageal reflux disease without esophagitis: Secondary | ICD-10-CM | POA: Diagnosis not present

## 2024-03-15 DIAGNOSIS — I4891 Unspecified atrial fibrillation: Secondary | ICD-10-CM | POA: Diagnosis not present

## 2024-03-15 DIAGNOSIS — K559 Vascular disorder of intestine, unspecified: Secondary | ICD-10-CM | POA: Diagnosis not present

## 2024-03-15 DIAGNOSIS — E43 Unspecified severe protein-calorie malnutrition: Secondary | ICD-10-CM | POA: Diagnosis not present

## 2024-03-15 DIAGNOSIS — E872 Acidosis, unspecified: Secondary | ICD-10-CM | POA: Diagnosis not present

## 2024-03-15 DIAGNOSIS — R69 Illness, unspecified: Secondary | ICD-10-CM | POA: Diagnosis not present

## 2024-03-15 DIAGNOSIS — R1030 Lower abdominal pain, unspecified: Secondary | ICD-10-CM | POA: Diagnosis not present

## 2024-03-15 DIAGNOSIS — I110 Hypertensive heart disease with heart failure: Secondary | ICD-10-CM | POA: Diagnosis not present

## 2024-03-15 DIAGNOSIS — K55019 Acute (reversible) ischemia of small intestine, extent unspecified: Secondary | ICD-10-CM | POA: Diagnosis not present

## 2024-03-15 DIAGNOSIS — E785 Hyperlipidemia, unspecified: Secondary | ICD-10-CM | POA: Diagnosis not present

## 2024-03-15 DIAGNOSIS — F0390 Unspecified dementia without behavioral disturbance: Secondary | ICD-10-CM | POA: Diagnosis not present

## 2024-03-15 DIAGNOSIS — E876 Hypokalemia: Secondary | ICD-10-CM | POA: Diagnosis not present

## 2024-03-15 DIAGNOSIS — I10 Essential (primary) hypertension: Secondary | ICD-10-CM | POA: Diagnosis not present

## 2024-03-15 DIAGNOSIS — Z515 Encounter for palliative care: Secondary | ICD-10-CM | POA: Diagnosis not present

## 2024-03-15 DIAGNOSIS — E039 Hypothyroidism, unspecified: Secondary | ICD-10-CM | POA: Diagnosis not present

## 2024-03-15 DIAGNOSIS — K8020 Calculus of gallbladder without cholecystitis without obstruction: Secondary | ICD-10-CM | POA: Diagnosis not present

## 2024-03-15 DIAGNOSIS — D0439 Carcinoma in situ of skin of other parts of face: Secondary | ICD-10-CM | POA: Diagnosis not present

## 2024-03-15 DIAGNOSIS — Z66 Do not resuscitate: Secondary | ICD-10-CM | POA: Diagnosis not present

## 2024-03-15 DIAGNOSIS — K562 Volvulus: Secondary | ICD-10-CM | POA: Diagnosis not present

## 2024-03-15 DIAGNOSIS — L97429 Non-pressure chronic ulcer of left heel and midfoot with unspecified severity: Secondary | ICD-10-CM | POA: Diagnosis not present

## 2024-03-15 DIAGNOSIS — I5022 Chronic systolic (congestive) heart failure: Secondary | ICD-10-CM | POA: Diagnosis not present

## 2024-03-15 DIAGNOSIS — E871 Hypo-osmolality and hyponatremia: Secondary | ICD-10-CM | POA: Diagnosis not present

## 2024-03-15 DIAGNOSIS — Z9862 Peripheral vascular angioplasty status: Secondary | ICD-10-CM

## 2024-03-15 DIAGNOSIS — K683 Retroperitoneal hematoma: Secondary | ICD-10-CM | POA: Diagnosis not present

## 2024-03-15 DIAGNOSIS — Z7401 Bed confinement status: Secondary | ICD-10-CM | POA: Diagnosis not present

## 2024-03-15 DIAGNOSIS — I739 Peripheral vascular disease, unspecified: Secondary | ICD-10-CM

## 2024-03-15 DIAGNOSIS — R112 Nausea with vomiting, unspecified: Secondary | ICD-10-CM | POA: Diagnosis not present

## 2024-03-15 DIAGNOSIS — R578 Other shock: Secondary | ICD-10-CM | POA: Diagnosis not present

## 2024-03-15 DIAGNOSIS — Z7901 Long term (current) use of anticoagulants: Secondary | ICD-10-CM

## 2024-03-15 DIAGNOSIS — E1151 Type 2 diabetes mellitus with diabetic peripheral angiopathy without gangrene: Secondary | ICD-10-CM | POA: Diagnosis not present

## 2024-03-15 DIAGNOSIS — R571 Hypovolemic shock: Secondary | ICD-10-CM | POA: Diagnosis not present

## 2024-03-15 DIAGNOSIS — I255 Ischemic cardiomyopathy: Secondary | ICD-10-CM

## 2024-03-15 DIAGNOSIS — E11621 Type 2 diabetes mellitus with foot ulcer: Secondary | ICD-10-CM | POA: Diagnosis not present

## 2024-03-15 DIAGNOSIS — G4733 Obstructive sleep apnea (adult) (pediatric): Secondary | ICD-10-CM | POA: Diagnosis not present

## 2024-03-15 HISTORY — PX: LAPAROTOMY: SHX154

## 2024-03-15 LAB — CBC WITH DIFFERENTIAL/PLATELET
Abs Immature Granulocytes: 0.1 10*3/uL — ABNORMAL HIGH (ref 0.00–0.07)
Basophils Absolute: 0 10*3/uL (ref 0.0–0.1)
Basophils Relative: 0 %
Eosinophils Absolute: 0 10*3/uL (ref 0.0–0.5)
Eosinophils Relative: 0 %
HCT: 34.3 % — ABNORMAL LOW (ref 39.0–52.0)
Hemoglobin: 11 g/dL — ABNORMAL LOW (ref 13.0–17.0)
Immature Granulocytes: 1 %
Lymphocytes Relative: 8 %
Lymphs Abs: 1.4 10*3/uL (ref 0.7–4.0)
MCH: 30.1 pg (ref 26.0–34.0)
MCHC: 32.1 g/dL (ref 30.0–36.0)
MCV: 94 fL (ref 80.0–100.0)
Monocytes Absolute: 1.1 10*3/uL — ABNORMAL HIGH (ref 0.1–1.0)
Monocytes Relative: 7 %
Neutro Abs: 13.8 10*3/uL — ABNORMAL HIGH (ref 1.7–7.7)
Neutrophils Relative %: 84 %
Platelets: 363 10*3/uL (ref 150–400)
RBC: 3.65 MIL/uL — ABNORMAL LOW (ref 4.22–5.81)
RDW: 21.6 % — ABNORMAL HIGH (ref 11.5–15.5)
Smear Review: NORMAL
WBC: 16.3 10*3/uL — ABNORMAL HIGH (ref 4.0–10.5)
nRBC: 0.3 % — ABNORMAL HIGH (ref 0.0–0.2)

## 2024-03-15 LAB — COMPREHENSIVE METABOLIC PANEL
ALT: 19 U/L (ref 0–44)
AST: 31 U/L (ref 15–41)
Albumin: 3.5 g/dL (ref 3.5–5.0)
Alkaline Phosphatase: 51 U/L (ref 38–126)
Anion gap: 11 (ref 5–15)
BUN: 32 mg/dL — ABNORMAL HIGH (ref 8–23)
CO2: 24 mmol/L (ref 22–32)
Calcium: 9 mg/dL (ref 8.9–10.3)
Chloride: 98 mmol/L (ref 98–111)
Creatinine, Ser: 1.15 mg/dL (ref 0.61–1.24)
GFR, Estimated: 60 mL/min — ABNORMAL LOW (ref >60)
Glucose, Bld: 174 mg/dL — ABNORMAL HIGH (ref 70–99)
Potassium: 3.6 mmol/L (ref 3.5–5.1)
Sodium: 133 mmol/L — ABNORMAL LOW (ref 135–145)
Total Bilirubin: 1.2 mg/dL (ref 0.0–1.2)
Total Protein: 7.6 g/dL (ref 6.5–8.1)

## 2024-03-15 LAB — TROPONIN I (HIGH SENSITIVITY)
Troponin I (High Sensitivity): 19 ng/L — ABNORMAL HIGH
Troponin I (High Sensitivity): 20 ng/L — ABNORMAL HIGH (ref <18)

## 2024-03-15 LAB — CBG MONITORING, ED: Glucose-Capillary: 163 mg/dL — ABNORMAL HIGH (ref 70–99)

## 2024-03-15 LAB — URINALYSIS, ROUTINE W REFLEX MICROSCOPIC
Bilirubin Urine: NEGATIVE
Glucose, UA: NEGATIVE mg/dL
Hgb urine dipstick: NEGATIVE
Ketones, ur: NEGATIVE mg/dL
Leukocytes,Ua: NEGATIVE
Nitrite: NEGATIVE
Protein, ur: NEGATIVE mg/dL
Specific Gravity, Urine: 1.018 (ref 1.005–1.030)
pH: 5 (ref 5.0–8.0)

## 2024-03-15 LAB — LACTIC ACID, PLASMA
Lactic Acid, Venous: 2.1 mmol/L (ref 0.5–1.9)
Lactic Acid, Venous: 2.4 mmol/L (ref 0.5–1.9)
Lactic Acid, Venous: 2.7 mmol/L (ref 0.5–1.9)

## 2024-03-15 LAB — CBC
HCT: 30.8 % — ABNORMAL LOW (ref 39.0–52.0)
Hemoglobin: 10 g/dL — ABNORMAL LOW (ref 13.0–17.0)
MCH: 30.2 pg (ref 26.0–34.0)
MCHC: 32.5 g/dL (ref 30.0–36.0)
MCV: 93.1 fL (ref 80.0–100.0)
Platelets: 335 10*3/uL (ref 150–400)
RBC: 3.31 MIL/uL — ABNORMAL LOW (ref 4.22–5.81)
RDW: 21.7 % — ABNORMAL HIGH (ref 11.5–15.5)
WBC: 18.3 10*3/uL — ABNORMAL HIGH (ref 4.0–10.5)
nRBC: 0.2 % (ref 0.0–0.2)

## 2024-03-15 LAB — LIPASE, BLOOD: Lipase: 24 U/L (ref 11–51)

## 2024-03-15 SURGERY — LAPAROTOMY, EXPLORATORY
Anesthesia: General

## 2024-03-15 MED ORDER — HALOPERIDOL LACTATE 5 MG/ML IJ SOLN: 2.0000 mg | Freq: Four times a day (QID) | INTRAMUSCULAR | Status: DC | PRN

## 2024-03-15 MED ORDER — PIPERACILLIN-TAZOBACTAM 3.375 G IVPB
INTRAVENOUS | Status: AC
  Filled 2024-03-15: qty 50

## 2024-03-15 MED ORDER — ONDANSETRON HCL 4 MG/2ML IJ SOLN
4.0000 mg | Freq: Four times a day (QID) | INTRAMUSCULAR | Status: DC | PRN
  Administered 2024-03-15: 4 mg via INTRAVENOUS

## 2024-03-15 MED ORDER — ONDANSETRON HCL 4 MG/2ML IJ SOLN
4.0000 mg | INTRAMUSCULAR | Status: DC | PRN
  Administered 2024-03-20 – 2024-03-21 (×3): 4 mg via INTRAVENOUS
  Filled 2024-03-15 (×3): qty 2

## 2024-03-15 MED ORDER — METOPROLOL TARTRATE 5 MG/5ML IV SOLN
5.0000 mg | Freq: Four times a day (QID) | INTRAVENOUS | Status: DC
  Administered 2024-03-15: 5 mg via INTRAVENOUS
  Filled 2024-03-15: qty 5

## 2024-03-15 MED ORDER — SODIUM CHLORIDE 0.9 % IV SOLN: INTRAVENOUS | Status: DC

## 2024-03-15 MED ORDER — LEVOTHYROXINE SODIUM 112 MCG PO TABS
112.0000 ug | ORAL_TABLET | Freq: Every day | ORAL | Status: DC
  Administered 2024-03-15 – 2024-04-03 (×18): 112 ug via ORAL
  Filled 2024-03-15 (×21): qty 1

## 2024-03-15 MED ORDER — CHLORHEXIDINE GLUCONATE CLOTH 2 % EX PADS
6.0000 | MEDICATED_PAD | Freq: Every day | CUTANEOUS | Status: DC
  Administered 2024-03-15 – 2024-03-19 (×5): 6 via TOPICAL

## 2024-03-15 MED ORDER — ACETAMINOPHEN 10 MG/ML IV SOLN
1000.0000 mg | Freq: Four times a day (QID) | INTRAVENOUS | Status: AC
  Administered 2024-03-15 – 2024-03-16 (×4): 1000 mg via INTRAVENOUS
  Filled 2024-03-15 (×5): qty 100

## 2024-03-15 MED ORDER — PIPERACILLIN-TAZOBACTAM 3.375 G IVPB
3.3750 g | Freq: Three times a day (TID) | INTRAVENOUS | Status: AC
  Administered 2024-03-15 – 2024-03-18 (×13): 3.375 g via INTRAVENOUS
  Filled 2024-03-15 (×12): qty 50

## 2024-03-15 MED ORDER — NOREPINEPHRINE 4 MG/250ML-% IV SOLN
INTRAVENOUS | Status: DC | PRN
  Administered 2024-03-15: 6 ug/min via INTRAVENOUS

## 2024-03-15 MED ORDER — FENTANYL CITRATE PF 50 MCG/ML IJ SOSY
25.0000 ug | PREFILLED_SYRINGE | Freq: Once | INTRAMUSCULAR | Status: AC
  Administered 2024-03-15: 25 ug via INTRAVENOUS
  Filled 2024-03-15: qty 1

## 2024-03-15 MED ORDER — BUPIVACAINE-EPINEPHRINE (PF) 0.5% -1:200000 IJ SOLN
INTRAMUSCULAR | Status: AC
  Filled 2024-03-15: qty 10

## 2024-03-15 MED ORDER — DEXAMETHASONE SODIUM PHOSPHATE 10 MG/ML IJ SOLN
INTRAMUSCULAR | Status: DC | PRN
  Administered 2024-03-15: 5 mg via INTRAVENOUS

## 2024-03-15 MED ORDER — PHENYLEPHRINE 80 MCG/ML (10ML) SYRINGE FOR IV PUSH (FOR BLOOD PRESSURE SUPPORT)
PREFILLED_SYRINGE | INTRAVENOUS | Status: DC | PRN
  Administered 2024-03-15: 160 ug via INTRAVENOUS
  Administered 2024-03-15: 80 ug via INTRAVENOUS
  Administered 2024-03-15 (×3): 160 ug via INTRAVENOUS
  Administered 2024-03-15: 80 ug via INTRAVENOUS

## 2024-03-15 MED ORDER — BUPIVACAINE-EPINEPHRINE (PF) 0.5% -1:200000 IJ SOLN
INTRAMUSCULAR | Status: DC | PRN
  Administered 2024-03-15: 40 mL

## 2024-03-15 MED ORDER — ACETAMINOPHEN 325 MG PO TABS: 650.0000 mg | ORAL_TABLET | Freq: Four times a day (QID) | ORAL | Status: DC | PRN

## 2024-03-15 MED ORDER — ACETAMINOPHEN 10 MG/ML IV SOLN: 1000.0000 mg | Freq: Once | INTRAVENOUS | Status: DC | PRN

## 2024-03-15 MED ORDER — HALOPERIDOL LACTATE 5 MG/ML IJ SOLN: 1.0000 mg | Freq: Four times a day (QID) | INTRAMUSCULAR | Status: DC | PRN

## 2024-03-15 MED ORDER — LIDOCAINE HCL (CARDIAC) PF 100 MG/5ML IV SOSY
PREFILLED_SYRINGE | INTRAVENOUS | Status: DC | PRN
  Administered 2024-03-15: 60 mg via INTRAVENOUS

## 2024-03-15 MED ORDER — ACETAMINOPHEN 650 MG RE SUPP: 650.0000 mg | Freq: Four times a day (QID) | RECTAL | Status: DC | PRN

## 2024-03-15 MED ORDER — MORPHINE SULFATE (PF) 2 MG/ML IV SOLN
2.0000 mg | INTRAVENOUS | Status: DC | PRN
  Administered 2024-03-16 – 2024-03-23 (×4): 2 mg via INTRAVENOUS
  Filled 2024-03-15 (×4): qty 1

## 2024-03-15 MED ORDER — PROPOFOL 10 MG/ML IV BOLUS
INTRAVENOUS | Status: DC | PRN
  Administered 2024-03-15: 60 mg via INTRAVENOUS

## 2024-03-15 MED ORDER — 0.9 % SODIUM CHLORIDE (POUR BTL) OPTIME
TOPICAL | Status: DC | PRN
  Administered 2024-03-15: 1000 mL

## 2024-03-15 MED ORDER — DROPERIDOL 2.5 MG/ML IJ SOLN: 0.6250 mg | Freq: Once | INTRAMUSCULAR | Status: DC | PRN

## 2024-03-15 MED ORDER — ONDANSETRON HCL 4 MG/2ML IJ SOLN
4.0000 mg | Freq: Once | INTRAMUSCULAR | Status: AC
  Administered 2024-03-15: 4 mg via INTRAVENOUS

## 2024-03-15 MED ORDER — METOPROLOL SUCCINATE ER 25 MG PO TB24
25.0000 mg | ORAL_TABLET | Freq: Every day | ORAL | Status: DC
  Filled 2024-03-15: qty 1

## 2024-03-15 MED ORDER — SIMVASTATIN 20 MG PO TABS: 40.0000 mg | ORAL_TABLET | Freq: Every evening | ORAL | Status: DC

## 2024-03-15 MED ORDER — PIPERACILLIN-TAZOBACTAM 3.375 G IVPB 30 MIN
3.3750 g | Freq: Once | INTRAVENOUS | Status: AC
  Administered 2024-03-15: 3.375 g via INTRAVENOUS
  Filled 2024-03-15 (×2): qty 50

## 2024-03-15 MED ORDER — ONDANSETRON HCL 4 MG PO TABS: 4.0000 mg | ORAL_TABLET | Freq: Four times a day (QID) | ORAL | Status: DC | PRN

## 2024-03-15 MED ORDER — ONDANSETRON HCL 4 MG/2ML IJ SOLN
INTRAMUSCULAR | Status: AC
  Filled 2024-03-15: qty 2

## 2024-03-15 MED ORDER — ROCURONIUM BROMIDE 100 MG/10ML IV SOLN
INTRAVENOUS | Status: DC | PRN
  Administered 2024-03-15: 60 mg via INTRAVENOUS

## 2024-03-15 MED ORDER — LIDOCAINE HCL 4 % EX SOLN
CUTANEOUS | Status: DC | PRN
  Administered 2024-03-15: 3 mL via TOPICAL

## 2024-03-15 MED ORDER — HYDROMORPHONE HCL 1 MG/ML IJ SOLN: 0.2500 mg | INTRAMUSCULAR | Status: DC | PRN

## 2024-03-15 MED ORDER — SODIUM CHLORIDE 0.9 % IV BOLUS
500.0000 mL | Freq: Once | INTRAVENOUS | Status: AC
  Administered 2024-03-15: 500 mL via INTRAVENOUS

## 2024-03-15 MED ORDER — SUGAMMADEX SODIUM 200 MG/2ML IV SOLN
INTRAVENOUS | Status: DC | PRN
  Administered 2024-03-15: 200 mg via INTRAVENOUS

## 2024-03-15 MED ORDER — SODIUM CHLORIDE 0.9 % IV SOLN: INTRAVENOUS | Status: DC | PRN

## 2024-03-15 MED ORDER — CALCIUM CHLORIDE 10 % IV SOLN
INTRAVENOUS | Status: AC
  Filled 2024-03-15: qty 10

## 2024-03-15 MED ORDER — IOHEXOL 300 MG/ML  SOLN
100.0000 mL | Freq: Once | INTRAMUSCULAR | Status: AC | PRN
  Administered 2024-03-15: 100 mL via INTRAVENOUS

## 2024-03-15 MED ORDER — FENTANYL CITRATE (PF) 100 MCG/2ML IJ SOLN
INTRAMUSCULAR | Status: DC | PRN
Start: 2024-03-15 — End: 2024-03-15
  Administered 2024-03-15: 25 ug via INTRAVENOUS
  Administered 2024-03-15: 50 ug via INTRAVENOUS
  Administered 2024-03-15: 100 ug via INTRAVENOUS
  Administered 2024-03-15: 25 ug via INTRAVENOUS

## 2024-03-15 SURGICAL SUPPLY — 39 items
CHLORAPREP W/TINT 26 (MISCELLANEOUS) IMPLANT
DRAPE LAPAROTOMY 100X77 ABD (DRAPES) ×1 IMPLANT
DRSG OPSITE POSTOP 4X10 (GAUZE/BANDAGES/DRESSINGS) IMPLANT
DRSG OPSITE POSTOP 4X8 (GAUZE/BANDAGES/DRESSINGS) IMPLANT
ELECT BLADE 6.5 EXT (BLADE) ×1 IMPLANT
ELECT REM PT RETURN 9FT ADLT (ELECTROSURGICAL) ×1 IMPLANT
ELECTRODE REM PT RTRN 9FT ADLT (ELECTROSURGICAL) ×1 IMPLANT
GLOVE BIOGEL PI IND STRL 7.5 (GLOVE) ×1 IMPLANT
GLOVE SURG SYN 7.0 (GLOVE) ×2 IMPLANT
GLOVE SURG SYN 7.0 PF PI (GLOVE) ×1 IMPLANT
GOWN STRL REUS W/ TWL LRG LVL3 (GOWN DISPOSABLE) ×2 IMPLANT
KIT TURNOVER KIT A (KITS) ×1 IMPLANT
LABEL OR SOLS (LABEL) ×1 IMPLANT
LIGASURE IMPACT 36 18CM CVD LR (INSTRUMENTS) IMPLANT
MANIFOLD NEPTUNE II (INSTRUMENTS) ×1 IMPLANT
NDL HYPO 22X1.5 SAFETY MO (MISCELLANEOUS) ×1 IMPLANT
NEEDLE HYPO 22X1.5 SAFETY MO (MISCELLANEOUS) ×1 IMPLANT
NS IRRIG 1000ML POUR BTL (IV SOLUTION) ×1 IMPLANT
PACK BASIN MAJOR ARMC (MISCELLANEOUS) ×1 IMPLANT
PACK COLON CLEAN CLOSURE (MISCELLANEOUS) ×1 IMPLANT
RELOAD PROXIMATE 75MM BLUE (ENDOMECHANICALS) IMPLANT
RELOAD STAPLE 75 3.8 BLU REG (ENDOMECHANICALS) IMPLANT
RETRACTOR WND ALEXIS-O 25 LRG (MISCELLANEOUS) IMPLANT
RETRACTOR WOUND ALXS 18CM MED (MISCELLANEOUS) IMPLANT
RETRACTOR WOUND ALXS 25CM LRG (MISCELLANEOUS) IMPLANT
RTRCTR WOUND ALEXIS O 18CM MED (MISCELLANEOUS) IMPLANT
RTRCTR WOUND ALEXIS O 25CM LRG (MISCELLANEOUS) IMPLANT
SPONGE T-LAP 18X18 ~~LOC~~+RFID (SPONGE) IMPLANT
STAPLER GUN LINEAR PROX 60 (STAPLE) IMPLANT
STAPLER PROXIMATE 75MM BLUE (STAPLE) IMPLANT
STAPLER SKIN PROX 35W (STAPLE) ×1 IMPLANT
SUT PDS AB 0 CT1 27 (SUTURE) IMPLANT
SUT SILK 2-0 18XBRD TIE 12 (SUTURE) IMPLANT
SUT SILK 3 0 SH CR/8 (SUTURE) IMPLANT
SUT VIC AB 3-0 SH 27X BRD (SUTURE) ×1 IMPLANT
SYR 20ML LL LF (SYRINGE) ×2 IMPLANT
TRAP FLUID SMOKE EVACUATOR (MISCELLANEOUS) ×1 IMPLANT
TRAY FOLEY MTR SLVR 16FR STAT (SET/KITS/TRAYS/PACK) ×1 IMPLANT
WATER STERILE IRR 500ML POUR (IV SOLUTION) ×1 IMPLANT

## 2024-03-15 NOTE — Anesthesia Preprocedure Evaluation (Addendum)
 Anesthesia Evaluation  Patient identified by MRN, date of birth, ID band Patient awake    Reviewed: Allergy & Precautions, H&P , NPO status , Patient's Chart, lab work & pertinent test results, reviewed documented beta blocker date and time   Airway Mallampati: II  TM Distance: >3 FB Neck ROM: full    Dental no notable dental hx.    Pulmonary pneumonia (flu a few weeks ago with improvement but residual cough)   Pulmonary exam normal        Cardiovascular Exercise Tolerance: Poor hypertension, Pt. on home beta blockers and Pt. on medications pulmonary hypertension (Cor pulmonale)+ CAD, + Past MI (1994), + CABG (s/p 2v CABG 02/26/1993 (LIMA-LAD, SVG-OM1)), + Peripheral Vascular Disease (01/22/2024: ENDARTERECTOMY FEMORAL; Left) and +CHF  + dysrhythmias (s/p cabg) Atrial Fibrillation (-) pacemaker  Echo 2024:  1. Left ventricular ejection fraction, by estimation, is 35 to 40%. The  left ventricle has moderately decreased function. The left ventricle  demonstrates regional wall motion abnormalities (see scoring  diagram/findings for description). Left ventricular   diastolic parameters are consistent with Grade II diastolic dysfunction  (pseudonormalization).   2. Right ventricular systolic function is mildly reduced. The right  ventricular size is moderately enlarged. There is severely elevated  pulmonary artery systolic pressure.   3. Left atrial size was severely dilated.   4. Right atrial size was severely dilated.   5. The mitral valve is normal in structure. Mild mitral valve  regurgitation. No evidence of mitral stenosis.   6. The aortic valve is tricuspid. There is mild calcification of the  aortic valve. There is mild thickening of the aortic valve. Aortic valve  regurgitation is not visualized. No aortic stenosis is present.   7. The inferior vena cava is dilated in size with <50% respiratory  variability, suggesting right  atrial pressure of 15 mmHg.    EKG 03/16/2023: entricular bigeminy LVH with IVCD and secondary repol abnrm ST depr, consider ischemia, inferior leads Anterior ST elevation, probably due to LVH Borderline prolonged QT interval Confirmed by UNCONFIRMED, DOCTOR   Neuro/Psych Deconditioned, unable to walk. Good strength in ankles with normal sensation.   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  Previously received surgery at [facility] in 2023.  Also status post RT. Pt has seen palliative care at had a DNR. -JULY 2024- 1. 11 mm subcutaneous nodule in the right neck near the mandibular angle is hypermetabolic.    Abdominal pain and nausea/vomiting, found to have high-grade SBO  Work up in the ED revealed a leukocytosis to 16.3K, Hgb to 11.0, sCr - 1.15, and initial venous lactate 2.1 (repeat 2.4).  CT Chest/Abdomen/Pelvis (03/15/2024) personally reviewed with small bowel dilation and mesenteric changes concerning for vascular congestion, no gross pneumatosis, no pneumoperitoneum, and radiologist report reviewed below:  IMPRESSION: 1. Small-bowel obstruction with transition point in the anterior left abdomen. There is swirling of vessels in the central mesentery. The dilated bowel demonstrates mild wall thickening. Adjacent mesenteric stranding, interloop free fluid and vascular congestion. Developing small bowel ischemia from internal hernia or closed loop obstruction are not excluded. Surgical consult recommended. 2. Small right pleural effusion and compressive atelectasis in the right lower lobe. 3.  Centrilobular micro nodules in the right middle lobe compatible with small airway  infection/inflammation. 4. Compression fracture of T12 is increased compared to 07/24/2023. 5. Aortic Atherosclerosis (ICD10-I70.0).   Large area of ecchymosis to the right lateral flank and chest wall   Past Medical History: No date: A-fib [facility])     Comment:  a.) CHA2DS2VASc = 5 (age x 2, CHF, HTN, previous               MI/vascular disease history);  b.) s/p DCCV 03/05/1993;               c.) rate/rhythm maintained on oral metoprolol succinate;               chronically anticoagulated with dose reduced apixaban No date: Aortic atherosclerosis (HCC) No date: B12 deficiency No date: CHF (congestive heart failure) (HCC)     Comment:  a.) TTE 11/05/12: EF 25-35, dist sep AK, mild MR; b.) TTE              05/10/14: EF 30, mild LVH, mod BAE, mild AR/PR, mod MR/TR;              c.) TTE 03/12/20: EF 20, sev glob HK, mod BAE, mild LAE,               mod RAE, triv PR, mild AR, sev MR/TR; d.) TTE 08/21/21: EF              30, sev glob HK, apical dyskinesis, mild LVH, mod BAE,               triv AR/PR, mod MR, sev TR; e.) TTE 09/25/23: EF 35-40,                diff AK/HK, G2DD, sev BAE, RVE, RVSP 66.3, mild MR, AoV               calc No date: Cor pulmonale (HCC) 02/21/1993: Coronary artery disease     Comment:  a.) LHC 02/21/1993 : 95% pLAD, 75% OM1, 50% OM3, 25-50%               pRCA, 50% large anterolateral lesion --> transfer [facility]              for CABG; b.) s/p 2v CABG 02/26/1993 (LIMA-LAD, SVG-OM1) No date: Family history of adverse reaction to anesthesia     Comment:  a.) PONV in 1st degree relative (daughter) No date: GERD (gastroesophageal reflux disease) No date: History of [place] spotted fever No date: HLD (hyperlipidemia) 04/25/2014: HTN (hypertension), benign No date: Hypothyroidism No date: Iron deficiency anemia No date: On apixaban therapy 02/21/1993: Posterolateral myocardial infarction [name])     Comment:  LHC 02/21/1993 : 95% pLAD, 75% OM1, 50% OM3, 25-50%                pRCA, 50% large anterolateral lesion --> transfer to [facility]              for CABG; b.) s/p 2v CABG 02/26/1993 (LIMA-LAD, SVG-OM1) No date: Pulmonary HTN (HCC)     Comment:  a.) TTE 05/10/2014: RVSP 48.6; b.) TTE 03/12/2020: RVSP               73.3; c.) TTE  09/25/2023: RVSP 66.3 No date: PVD (peripheral vascular disease) (HCC) 02/26/1993: S/P CABG x 2     Comment:  a.) LIMA-LAD, SVG-OM1 --> complicated by atrial flutter  on POD4 --> Tx'd with digoxin + procainamide with no               resolution. POD5 diltiazem gtt added with no improvement.              Ultimately required DCCV on 03/05/1993. 11/18/2021: Squamous cell carcinoma of skin     Comment:  right ear and mandible, EDC 03/18/2022: Squamous cell carcinoma of skin     Comment:  right mandible and ear/refer for Mohs/ Dr. Adriana Simas has               referred pt to Dr. Nedra Hai in otolaryngology excised by Dr.               Nedra Hai 05/03/22, Radiation with Dr. Rushie Chestnut 11/10/2018: Type 2 diabetes mellitus with peripheral angiopathy [facility])  Past Surgical History: 01/22/2024: ANGIOPLASTY; Left     Comment:  Procedure: BALLOON ANGIOPLASTY OF TIBIAL PERONEAL TRUNK               AND POSTERIOR TBIAL ARTERY AND DRUG COATED BALLOON               ANGIOPLASTY OF POPLITEAL ARTERY;  Surgeon: Nada Libman, MD;  Location: [facility] OR;  Service: Vascular;  Laterality:              Left; 2023: cancer removal      Comment:  below the right base of ear at [facility] No date: COLONOSCOPY 02/26/1993: CORONARY ARTERY BYPASS GRAFT; N/A     Comment:  Procedure: CORONARY ARTERY BYPASS GRAFT; Location: [facility];              Surgeon: Marshell Garfinkel, MD 01/22/2024: ENDARTERECTOMY FEMORAL; Left     Comment:  Procedure: LEFT COMMON FEMORAL ENDARTERECTOMY WITH VEIN               PATCH;  Surgeon: Nada Libman, MD;  Location: [facility] OR;               Service: Vascular;  Laterality: Left; 01/22/2024: FEMORAL-FEMORAL BYPASS GRAFT; Left     Comment:  Procedure:  BYPASS GRAFT LEFT COMMON FEMORAL TO PROFUNDA               ARTERY USING DACRON GRAFT;  Surgeon: Nada Libman, MD;  Location: [facility] OR;  Service: Vascular;  Laterality:              Left; No date: HERNIA REPAIR 08/30/2021: LOWER EXTREMITY ANGIOGRAPHY; Left     Comment:  Procedure: LOWER EXTREMITY ANGIOGRAPHY;  Surgeon:               Renford Dills, MD;  Location: [facility] INVASIVE CV LAB;               Service: Cardiovascular;  Laterality: Left; 11/10/2023: LOWER EXTREMITY ANGIOGRAPHY; Left     Comment:  Procedure: Lower Extremity Angiography;  Surgeon:               Renford Dills, MD;  Location: [facility] INVASIVE CV LAB;               Service: Cardiovascular;  Laterality: Left;  BMI    Body Mass Index: 21.41 kg/m      Reproductive/Obstetrics negative OB ROS  Anesthesia Physical Anesthesia Plan  ASA: 4  Anesthesia Plan: General ETT and General   Post-op Pain Management: Ofirmev IV (intra-op)*, Toradol IV (intra-op)* and Regional block*   Induction: Intravenous and Rapid sequence  PONV Risk Score and Plan: 2 and Ondansetron and Dexamethasone  Airway Management Planned: Oral ETT  Additional Equipment: Arterial line  Intra-op Plan:   Post-operative Plan: Extubation in OR  Informed Consent: I have reviewed the patients History and Physical, chart, labs and discussed the procedure including the risks, benefits and alternatives for the proposed anesthesia with the patient or authorized representative who has indicated his/her understanding and acceptance.     Dental Advisory Given  Plan Discussed with: CRNA and Surgeon  Anesthesia Plan Comments: (Pre-op NGT)         Anesthesia Quick Evaluation

## 2024-03-15 NOTE — H&P (Signed)
 History and Physical    Patient: [name] [name] DOB: May 28, 1929 DOA: 03/14/2024 DOS: the patient was seen and examined on 03/15/2024 PCP: [name], MD  Patient coming from: SNF  Chief Complaint:  Chief Complaint  Patient presents with   Nausea    Pt arrives from home place of  c/o n/v and lower abd pain that started today around noon. Pt received zofran at facility without relief. Pt received 4mg  IM zofran enroute to ER. Pt still nauseated and c/o lower abd pain. Pt's daughter reports pt has loss of appetite and nausea since cancer treatment last Tuesday.     HPI: [name] [name] is a 89 y.o. male with medical history significant for Severe PAD s/p multiple revascularization most recently 01/22/2024 for critical limb ischemia, and on chronic anticoagulation with Eliquis, systolic CHF secondary to ischemic cardiomyopathy, EF 35 to 40% 08/2023, CAD s/p CABG times 03/16/1998, HTN, who is being admitted with a small bowel obstruction, after presenting with nausea vomiting lower abdominal pain that started around noon on 3/17.  He had no diarrhea, no blood in the stool, no fever or chills. ED course and data review: Vitals mostly unremarkable. Labs notable for WBC 16,000 with lactic acid 2.1 Hemoglobin 11 Troponin 20 CMP for the most part unremarkable, lipase normal Urinalysis without infection EKG, personally viewed and interpreted showing A-fib at 104 with ventricular bigeminy and LVH CT chest abdomen and pelvis with contrast showing small bowel obstruction as follows "Small-bowel obstruction with transition point in the anterior left abdomen. There is swirling of vessels in the central mesentery. The dilated bowel demonstrates mild wall thickening. Adjacent mesenteric stranding, interloop free fluid and vascular congestion. Developing small bowel ischemia from internal hernia or closed loop obstruction are not excluded. Surgical consult recommended"  The ED  provider spoke with surgeon, Dr. Maurine Minister who recommends NG tube and empiric antibiotic coverage, admit to general medicine and will see in the a.m. Started on [facility] consulted for admission    Past Medical History:  Diagnosis Date   A-fib [facility])    a.) CHA2DS2VASc = 5 (age x 2, CHF, HTN, previous MI/vascular disease history);  b.) s/p DCCV 03/05/1993; c.) rate/rhythm maintained on oral metoprolol succinate; chronically anticoagulated with dose reduced apixaban   Aortic atherosclerosis (HCC)    B12 deficiency    CHF (congestive heart failure) (HCC)    a.) TTE 11/05/12: EF 25-35, dist sep AK, mild MR; b.) TTE 05/10/14: EF 30, mild LVH, mod BAE, mild AR/PR, mod MR/TR; c.) TTE 03/12/20: EF 20, sev glob HK, mod BAE, mild LAE, mod RAE, triv PR, mild AR, sev MR/TR; d.) TTE 08/21/21: EF 30, sev glob HK, apical dyskinesis, mild LVH, mod BAE, triv AR/PR, mod MR, sev TR; e.) TTE 09/25/23: EF 35-40,  diff AK/HK, G2DD, sev BAE, RVE, RVSP 66.3, mild MR, AoV calc   Cor pulmonale (HCC)    Coronary artery disease 02/21/1993   a.) LHC 02/21/1993 : 95% pLAD, 75% OM1, 50% OM3, 25-50% pRCA, 50% large anterolateral lesion --> transfer to [facility] for CABG; b.) s/p 2v CABG 02/26/1993 (LIMA-LAD, SVG-OM1)   Family history of adverse reaction to anesthesia    a.) PONV in 1st degree relative (daughter)   GERD (gastroesophageal reflux disease)    History of [place] spotted fever    HLD (hyperlipidemia)    HTN (hypertension), benign 04/25/2014   Hypothyroidism    Iron deficiency anemia    On apixaban therapy    Posterolateral  myocardial infarction (HCC) 02/21/1993   LHC 02/21/1993 : 95% pLAD, 75% OM1, 50% OM3, 25-50% pRCA, 50% large anterolateral lesion --> transfer to [facility] for CABG; b.) s/p 2v CABG 02/26/1993 (LIMA-LAD, SVG-OM1)   Pulmonary HTN (HCC)    a.) TTE 05/10/2014: RVSP 48.6; b.) TTE 03/12/2020: RVSP 73.3; c.) TTE  09/25/2023: RVSP 66.3   PVD (peripheral vascular disease) (HCC)    S/P CABG x 2  02/26/1993   a.) LIMA-LAD, SVG-OM1 --> complicated by atrial flutter on POD4 --> Tx'd with digoxin + procainamide with no resolution. POD5 diltiazem gtt added with no improvement. Ultimately required DCCV on 03/05/1993.   Squamous cell carcinoma of skin 11/18/2021   right ear and mandible, [facility]   Squamous cell carcinoma of skin 03/18/2022   right mandible and ear/refer for Mohs/ Dr. Adriana Simas has referred pt to Dr. Nedra Hai in otolaryngology excised by Dr. Nedra Hai 05/03/22, Radiation with Dr. Rushie Chestnut   Type 2 diabetes mellitus with peripheral angiopathy (HCC) 11/10/2018   Past Surgical History:  Procedure Laterality Date   ANGIOPLASTY Left 01/22/2024   Procedure: BALLOON ANGIOPLASTY OF TIBIAL PERONEAL TRUNK AND POSTERIOR TBIAL ARTERY AND DRUG COATED BALLOON ANGIOPLASTY OF POPLITEAL ARTERY;  Surgeon: Nada Libman, MD;  Location: [facility] OR;  Service: Vascular;  Laterality: Left;   cancer removal   2023   below the right base of ear at [facility]   COLONOSCOPY     CORONARY ARTERY BYPASS GRAFT N/A 02/26/1993   Procedure: CORONARY ARTERY BYPASS GRAFT; Location: [facility]; Surgeon: Marshell Garfinkel, MD   ENDARTERECTOMY FEMORAL Left 01/22/2024   Procedure: LEFT COMMON FEMORAL ENDARTERECTOMY WITH VEIN PATCH;  Surgeon: Nada Libman, MD;  Location: [facility] OR;  Service: Vascular;  Laterality: Left;   FEMORAL-FEMORAL BYPASS GRAFT Left 01/22/2024   Procedure: BYPASS GRAFT LEFT COMMON FEMORAL TO PROFUNDA  ARTERY USING DACRON GRAFT;  Surgeon: Nada Libman, MD;  Location: [facility] OR;  Service: Vascular;  Laterality: Left;   HERNIA REPAIR     LOWER EXTREMITY ANGIOGRAPHY Left 08/30/2021   Procedure: LOWER EXTREMITY ANGIOGRAPHY;  Surgeon: Renford Dills, MD;  Location: [facility] INVASIVE CV LAB;  Service: Cardiovascular;  Laterality: Left;   LOWER EXTREMITY ANGIOGRAPHY Left 11/10/2023   Procedure: Lower Extremity Angiography;  Surgeon: Renford Dills, MD;  Location: [facility] INVASIVE CV LAB;  Service: Cardiovascular;  Laterality: Left;    Social History:  reports that he has never smoked. He has never used smokeless tobacco. He reports that he does not drink alcohol and does not use drugs.  Allergies  Allergen Reactions   Cephalexin Rash   Spironolactone Nausea Only   Codeine Rash and Other (See Comments)    Can not sleep   Doxycycline Rash    Family History  Problem Relation Age of Onset   Hypertension Mother    Diabetes Brother    Stroke Brother    Heart attack Brother    Esophageal cancer Daughter    Colon cancer Daughter     Prior to Admission medications   Medication Sig Start Date End Date Taking? Authorizing Provider  acetaminophen (TYLENOL) 325 MG tablet Take 650 mg by mouth in the morning and at bedtime.    [provider]  apixaban (ELIQUIS) 2.5 MG TABS tablet Take 1 tablet (2.5 mg total) by mouth 2 (two) times daily. 01/26/24   Emilie Rutter, PA-C  aspirin EC 81 MG tablet Take 1 tablet (81 mg total) by mouth daily. Swallow whole. 01/26/24   Emilie Rutter, PA-C  cyanocobalamin (VITAMIN  B12) 1000 MCG tablet Take 1,000 mcg by mouth in the morning.    [provider]  ferrous sulfate 325 (65 FE) MG EC tablet Take 325 mg by mouth 2 (two) times a week. Tuesday and Thursday in the morning    [provider]  levothyroxine (SYNTHROID, LEVOTHROID) 112 MCG tablet Take 112 mcg by mouth daily. 03/28/14   [provider]  metoprolol succinate (TOPROL-XL) 25 MG 24 hr tablet Take 25 mg by mouth daily. 10/21/23   [provider]  ondansetron (ZOFRAN) 8 MG tablet Take 1 tablet (8 mg total) by mouth every 8 (eight) hours as needed for nausea or vomiting. 03/09/24   Earna Coder, MD  simvastatin (ZOCOR) 40 MG tablet Take 40 mg by mouth daily. 03/28/14   [provider]  torsemide (DEMADEX) 10 MG tablet Take 10 mg by mouth in the morning.    [provider]    Physical Exam: Vitals:   03/14/24 2356 03/14/24 2358 03/15/24 0000 03/15/24 0128  BP:  125/83   121/69  Pulse: 92   85  Resp: (!) 22   (!) 22  Temp: (!) 97.3 F (36.3 C)     TempSrc: Oral     SpO2: 98%  98% 93%  Weight:  62 kg     Physical Exam Vitals and nursing note reviewed.  Constitutional:      General: He is not in acute distress.    Comments: Frail-appearing elderly male  HENT:     Head: Normocephalic and atraumatic.  Cardiovascular:     Rate and Rhythm: Normal rate and regular rhythm.     Heart sounds: Normal heart sounds.  Pulmonary:     Effort: Pulmonary effort is normal.     Breath sounds: Normal breath sounds.  Chest:     Comments: Contusion right lateral chest and lateral flank--see pic Abdominal:     General: There is distension.     Palpations: Abdomen is soft.     Tenderness: There is abdominal tenderness.  Neurological:     Mental Status: Mental status is at baseline.     Labs on Admission: I have personally reviewed following labs and imaging studies  CBC: Recent Labs  Lab 03/08/24 0839 03/15/24 0013  WBC 14.4* 16.3*  NEUTROABS 10.7* 13.8*  HGB 8.9* 11.0*  HCT 27.5* 34.3*  MCV 94.8 94.0  PLT 386 363   Basic Metabolic Panel: Recent Labs  Lab 03/08/24 0839 03/15/24 0013  NA 136 133*  K 3.8 3.6  CL 102 98  CO2 26 24  GLUCOSE 114* 174*  BUN 15 32*  CREATININE 1.06 1.15  CALCIUM 8.5* 9.0   GFR: Estimated Creatinine Clearance: 35.9 mL/min (by C-G formula based on SCr of 1.15 mg/dL). Liver Function Tests: Recent Labs  Lab 03/08/24 0839 03/15/24 0013  AST 27 31  ALT 13 19  ALKPHOS 50 51  BILITOT 1.0 1.2  PROT 7.2 7.6  ALBUMIN 3.3* 3.5   Recent Labs  Lab 03/15/24 0013  LIPASE 24   No results for input(s): "AMMONIA" in the last 168 hours. Coagulation Profile: No results for input(s): "INR", "PROTIME" in the last 168 hours. Cardiac Enzymes: No results for input(s): "CKTOTAL", "CKMB", "CKMBINDEX", "TROPONINI" in the last 168 hours. BNP (last 3 results) No results for input(s): "PROBNP" in the last 8760  hours. HbA1C: No results for input(s): "HGBA1C" in the last 72 hours. CBG: No results for input(s): "GLUCAP" in the last 168 hours. Lipid Profile: No results  for input(s): "CHOL", "HDL", "LDLCALC", "TRIG", "CHOLHDL", "LDLDIRECT" in the last 72 hours. Thyroid Function Tests: No results for input(s): "TSH", "T4TOTAL", "FREET4", "T3FREE", "THYROIDAB" in the last 72 hours. Anemia Panel: No results for input(s): "VITAMINB12", "FOLATE", "FERRITIN", "TIBC", "IRON", "RETICCTPCT" in the last 72 hours. Urine analysis:    Component Value Date/Time   COLORURINE YELLOW (A) 03/15/2024 0013   APPEARANCEUR CLEAR (A) 03/15/2024 0013   APPEARANCEUR Clear 11/06/2013 1304   LABSPEC 1.018 03/15/2024 0013   LABSPEC 1.014 11/06/2013 1304   PHURINE 5.0 03/15/2024 0013   GLUCOSEU NEGATIVE 03/15/2024 0013   GLUCOSEU Negative 11/06/2013 1304   HGBUR NEGATIVE 03/15/2024 0013   BILIRUBINUR NEGATIVE 03/15/2024 0013   BILIRUBINUR Negative 11/06/2013 1304   KETONESUR NEGATIVE 03/15/2024 0013   PROTEINUR NEGATIVE 03/15/2024 0013   NITRITE NEGATIVE 03/15/2024 0013   LEUKOCYTESUR NEGATIVE 03/15/2024 0013   LEUKOCYTESUR Negative 11/06/2013 1304    Radiological Exams on Admission: CT CHEST ABDOMEN PELVIS W CONTRAST Result Date: 03/15/2024 CLINICAL DATA:  Abdominal pain, nausea, vomiting, ecchymosis in the right chest, abdomen, and pelvis. On anticoagulant. EXAM: CT CHEST, ABDOMEN, AND PELVIS WITH CONTRAST TECHNIQUE: Multidetector CT imaging of the chest, abdomen and pelvis was performed following the standard protocol during bolus administration of intravenous contrast. RADIATION DOSE REDUCTION: This exam was performed according to the departmental dose-optimization program which includes automated exposure control, adjustment of the mA and/or kV according to patient size and/or use of iterative reconstruction technique. CONTRAST:  OMNIPAQUE IOHEXOL 300 MG/ML  SOLN COMPARISON:  PET/CT 07/24/2023; CT abdomen  pelvis 04/27/2023 FINDINGS: CT CHEST FINDINGS Cardiovascular: Cardiomegaly. No pericardial effusion. Coronary artery and aortic atherosclerotic calcification. Mediastinum/Nodes: No enlarged mediastinal, hilar, or axillary lymph nodes. Thyroid gland, trachea, and esophagus demonstrate no significant findings. Lungs/Pleura: Small right pleural effusion and compressive atelectasis in the right lower lobe. Additional atelectasis in the left lower lobe. No pneumothorax. Centrilobular micro nodules in the right middle lobe compatible with small airway infection/inflammation. Musculoskeletal: Compression fracture of T12 is increased compared to 07/24/2023. There is 40% vertebral body height loss anteriorly. No retropulsion. CT ABDOMEN PELVIS FINDINGS Hepatobiliary: Cholelithiasis. No evidence of acute cholecystitis. No biliary dilation. No acute P[place]ock abnormality. Pancreas: Unremarkable. Spleen: Unremarkable. Adrenals/Urinary Tract: Normal adrenal glands. Atrophic native kidneys. No urinary calculi or hydronephrosis. Unremarkable bladder. Stomach/Bowel: Stomach is within normal limits. Diffuse dilation of the small bowel with transition point in the anterior left abdomen (circa series 5/image 39). There is swirling of vessels in the central mesentery. The dilated bowel demonstrates mild wall thickening. Adjacent mesenteric stranding, interloop free fluid and vascular congestion. Bowel ischemia from internal hernia or closed loop obstruction are not excluded. No pneumatosis. Normal colon without wall thickening. Normal appendix. Vascular/Lymphatic: Aortic atherosclerotic calcification. No lymphadenopathy. Reproductive: No acute abnormality. Other: Small volume abdominopelvic ascites. No free intraperitoneal air. Musculoskeletal: 9 no acute fracture. IMPRESSION: 1. Small-bowel obstruction with transition point in the anterior left abdomen. There is swirling of vessels in the central mesentery. The dilated bowel  demonstrates mild wall thickening. Adjacent mesenteric stranding, interloop free fluid and vascular congestion. Developing small bowel ischemia from internal hernia or closed loop obstruction are not excluded. Surgical consult recommended. 2. Small right pleural effusion and compressive atelectasis in the right lower lobe. 3. Centrilobular micro nodules in the right middle lobe compatible with small airway infection/inflammation. 4. Compression fracture of T12 is increased compared to 07/24/2023. 5. Aortic Atherosclerosis (ICD10-I70.0). Electronically Signed   By: Minerva Fester M.D.   On: 03/15/2024 01:41  Data Reviewed: Relevant notes from primary care and specialist visits, past discharge summaries as available in EHR, including Care Everywhere. Prior diagnostic testing as pertinent to current admission diagnoses Updated medications and problem lists for reconciliation ED course, including vitals, labs, imaging, treatment and response to treatment Triage notes, nursing and pharmacy notes and ED provider's notes Notable results as noted in HPI   Assessment and Plan: * Small bowel obstruction (HCC) Small bowel ischemia Lactic acidosis Patient with abdominal pain nausea and vomiting but without diarrhea or blood in stool WBC 16,000 with lactic acid 2.1.  Elevated lactic acid secondary to bowel ischemia CT abdomen and pelvis with contrast showing small bowel obstruction and developing small bowel ischemia Surgery aware and formally consulted Patient is high anesthetic risk--will need cardiac clearance if surgery Zosyn, pain control Keep n.p.o.   CAD with history of CABG x3 (coronary artery disease) No complaints of chest pain Continue GDMT with aspirin, metoprolol, simvastatin.  Hold apixaban in case of procedure  PVD (peripheral vascular disease) (HCC) History of lower extremity revascularization in January 2025 Chronic anticoagulation Holding apixaban and aspirin in case of  procedure Continue metoprolol and simvastatin  Chronic heart failure with reduced ejection fraction (HFrEF, <= 40%) (HCC) Ischemic cardiomyopathy Continue GDMT  HTN (hypertension), benign Continue home meds  Acquired hypothyroidism Continue home meds with small sips        DVT prophylaxis: SCD in case of procedure  Consults: Surgery Dr. Maurine Minister  Advance Care Planning:   Code Status: Prior   Family Communication: none  Disposition Plan: Back to previous home environment  Severity of Illness: The appropriate patient status for this patient is INPATIENT. Inpatient status is judged to be reasonable and necessary in order to provide the required intensity of service to ensure the patient's safety. The patient's presenting symptoms, physical exam findings, and initial radiographic and laboratory data in the context of their chronic comorbidities is felt to place them at high risk for further clinical deterioration. Furthermore, it is not anticipated that the patient will be medically stable for discharge from the hospital within 2 midnights of admission.   * I certify that at the point of admission it is my clinical judgment that the patient will require inpatient hospital care spanning beyond 2 midnights from the point of admission due to high intensity of service, high risk for further deterioration and high frequency of surveillance required.*  Author: Andris Baumann, MD 03/15/2024 3:07 AM  For on call review [email or web address].

## 2024-03-15 NOTE — ED Notes (Signed)
Pt returns from radiology at this time

## 2024-03-15 NOTE — Assessment & Plan Note (Signed)
 No complaints of chest pain Continue GDMT with aspirin, metoprolol, simvastatin.  Hold apixaban in case of procedure

## 2024-03-15 NOTE — ED Notes (Signed)
 ED Provider at bedside.

## 2024-03-15 NOTE — ED Notes (Signed)
 Patient dressed out in a patient gown. Patients clothes put in patient belongings bag.

## 2024-03-15 NOTE — Progress Notes (Signed)
 Interim progress note not for billing.  Admitted earlier this morning by my colleague, vasculopath with history CAD s/p cabg, PAD s/p mulriple revascularizations most recently earlier this year, presenting with abdominal pain and nausea/vomiting, found to have high-grade SBO. I have seen and examined the patient, reviewed the h and p, and agree with the assessment and plan. Has been started on antibiotics, general surgery has seen, plans on ex lap this morning.

## 2024-03-15 NOTE — Assessment & Plan Note (Addendum)
 Small bowel ischemia Lactic acidosis Patient with abdominal pain nausea and vomiting but without diarrhea or blood in stool WBC 16,000 with lactic acid 2.1.  Elevated lactic acid secondary to bowel ischemia CT abdomen and pelvis with contrast showing small bowel obstruction and developing small bowel ischemia Surgery aware and formally consulted Patient is high anesthetic risk--will need cardiac clearance if surgery Zosyn, pain control Keep n.p.o.

## 2024-03-15 NOTE — Assessment & Plan Note (Signed)
 Ischemic cardiomyopathy Continue GDMT

## 2024-03-15 NOTE — ED Notes (Signed)
 Pt straight cathed for urine. 50mL output recorded.

## 2024-03-15 NOTE — Op Note (Signed)
 Operative note  Preoperative diagnosis: Small bowel obstruction Postoperative diagnosis: Small bowel obstruction, volvulus of small bowel and lysis of adhesion Surgeon: Tracey Harries, MD EBL: 25 cc Assistant: Laqueta Due, PA-C the assistance of Mr. Manus Rudd was needed for retraction, bowel dissection and closure of the abdomen   After informed consent was obtained the patient was brought to the operating room placed supine on the operating room table.  General endotracheal anesthesia was induced after an NG tube was inserted.  An A-line was inserted and his abdomen was then prepped and draped in the usual sterile fashion.  A surgical timeout was called identifying correct patient, site, side and procedure.  An upper midline incision was made in the midline and taken down through the subcutaneous tissue with Bovie cautery.  The fascia was identified and incised with Bovie cautery.  The peritoneum was then grasped with hemostats and opened bluntly.  There was approximately 200 cc of serosanguineous free fluid within the abdomen.  The bowel was then eviscerated.  In the mid jejunum there was hemorrhagic bowel.  This led to the left hemiabdomen.  The bowel was run from this and I was able to eviscerate all the small bowel.  During the evisceration the bowel appeared to be volvulized and it was untwisted along the axis of the mesentery.  There was also a small band of adhesive tissue that was causing the obstruction that was lysed bluntly.

## 2024-03-15 NOTE — ED Provider Notes (Signed)
 [facility] Provider Note    Event Date/Time   First MD Initiated Contact with Patient 03/15/24 0047     (approximate)   History   Nausea (Pt arrives from home place of [place] c/o n/v and lower abd pain that started today around noon. Pt received zofran at facility without relief. Pt received 4mg  IM zofran enroute to ER. Pt still nauseated and c/o lower abd pain. Pt's daughter reports pt has loss of appetite and nausea since cancer treatment last Tuesday. )   HPI  [name] [name] is a 89 y.o. male brought to the ED via EMS from home placed in [place] with a chief complaint of nausea/vomiting and lower abdominal pain which began yesterday around noon.  Daughter states he had his second round of immunotherapy for ENT cancer last week.  Had to take some Zofran for nausea at that time.  Patient received Zofran at the facility without relief of symptoms.  Received 4 mg IV Zofran by EMS.  Daughter reports decreased appetite x 1 week since his last immunotherapy treatment.  Denies fever/chills, chest pain, dysuria or diarrhea.  Last BM 3 to 4 days ago which daughter states is usual for patient.  Patient does take anticoagulant for atrial fibrillation.     Past Medical History   Past Medical History:  Diagnosis Date   A-fib [facility])    a.) CHA2DS2VASc = 5 (age x 2, CHF, HTN, previous MI/vascular disease history);  b.) s/p DCCV 03/05/1993; c.) rate/rhythm maintained on oral metoprolol succinate; chronically anticoagulated with dose reduced apixaban   Aortic atherosclerosis (HCC)    B12 deficiency    CHF (congestive heart failure) (HCC)    a.) TTE 11/05/12: EF 25-35, dist sep AK, mild MR; b.) TTE 05/10/14: EF 30, mild LVH, mod BAE, mild AR/PR, mod MR/TR; c.) TTE 03/12/20: EF 20, sev glob HK, mod BAE, mild LAE, mod RAE, triv PR, mild AR, sev MR/TR; d.) TTE 08/21/21: EF 30, sev glob HK, apical dyskinesis, mild LVH, mod BAE, triv AR/PR, mod MR, sev TR; e.) TTE 09/25/23: EF  35-40,  diff AK/HK, G2DD, sev BAE, RVE, RVSP 66.3, mild MR, AoV calc   Cor pulmonale (HCC)    Coronary artery disease 02/21/1993   a.) LHC 02/21/1993 : 95% pLAD, 75% OM1, 50% OM3, 25-50% pRCA, 50% large anterolateral lesion --> transfer to [facility] for CABG; b.) s/p 2v CABG 02/26/1993 (LIMA-LAD, SVG-OM1)   Family history of adverse reaction to anesthesia    a.) PONV in 1st degree relative (daughter)   GERD (gastroesophageal reflux disease)    History of [place] spotted fever    HLD (hyperlipidemia)    HTN (hypertension), benign 04/25/2014   Hypothyroidism    Iron deficiency anemia    On apixaban therapy    Posterolateral myocardial infarction (HCC) 02/21/1993   LHC 02/21/1993 : 95% pLAD, 75% OM1, 50% OM3, 25-50% pRCA, 50% large anterolateral lesion --> transfer to [facility] for CABG; b.) s/p 2v CABG 02/26/1993 (LIMA-LAD, SVG-OM1)   Pulmonary HTN (HCC)    a.) TTE 05/10/2014: RVSP 48.6; b.) TTE 03/12/2020: RVSP 73.3; c.) TTE  09/25/2023: RVSP 66.3   PVD (peripheral vascular disease) (HCC)    S/P CABG x 2 02/26/1993   a.) LIMA-LAD, SVG-OM1 --> complicated by atrial flutter on POD4 --> Tx'd with digoxin + procainamide with no resolution. POD5 diltiazem gtt added with no improvement. Ultimately required DCCV on 03/05/1993.   Squamous cell carcinoma of skin 11/18/2021   right ear and  mandible, EDC   Squamous cell carcinoma of skin 03/18/2022   right mandible and ear/refer for Mohs/ Dr. Adriana Simas has referred pt to Dr. Nedra Hai in otolaryngology excised by Dr. Nedra Hai 05/03/22, Radiation with Dr. Rushie Chestnut   Type 2 diabetes mellitus with peripheral angiopathy (HCC) 11/10/2018     Active Problem List   Patient Active Problem List   Diagnosis Date Noted   Small bowel obstruction (HCC) 03/15/2024   PVD (peripheral vascular disease) (HCC) 03/15/2024   Ischemic cardiomyopathy 03/15/2024   Chronic anticoagulation 03/15/2024   Lactic acidosis 03/15/2024   History of revascularization procedure of lower extremity  03/15/2024   Recurrent squamous cell carcinoma in situ (SCCIS) of skin of cheek 02/09/2024   Symptomatic anemia 02/09/2024   Critical limb ischemia of left lower extremity (HCC) 01/21/2024   Pressure injury of skin of dorsum of left foot 09/27/2023   Fall 09/26/2023   HFrEF (heart failure with reduced ejection fraction) (HCC) 09/26/2023   Syncope 09/24/2023   Chronic heart failure with reduced ejection fraction (HFrEF, <= 40%) (HCC) 09/04/2021   Atherosclerosis of native arteries of the extremities with ulceration (HCC) 09/04/2021   Atherosclerosis of artery of extremity with rest pain (HCC) 08/30/2021   Atherosclerosis of native arteries of extremity with intermittent claudication (HCC) 02/18/2021   Cataract cortical, senile 02/11/2021   Cor pulmonale, acute (HCC) 05/02/2020   Type 2 diabetes mellitus with peripheral angiopathy (HCC) 11/10/2018   Medicare annual wellness visit, initial 05/05/2017   Varicose veins of lower extremities with ulcer (HCC) 04/23/2017   Chronic venous insufficiency 04/23/2017   Leg pain 04/23/2017   Lymphedema 04/23/2017   A-fib (HCC) 04/23/2017   Hyperlipidemia 04/23/2017   Acquired hypothyroidism 11/03/2016   B12 deficiency 10/27/2014   CAD with history of CABG x3 (coronary artery disease) 04/25/2014   GERD (gastroesophageal reflux disease) 04/25/2014   HTN (hypertension), benign 04/25/2014   S/P CABG x 3 02/26/1993     Past Surgical History   Past Surgical History:  Procedure Laterality Date   ANGIOPLASTY Left 01/22/2024   Procedure: BALLOON ANGIOPLASTY OF TIBIAL PERONEAL TRUNK AND POSTERIOR TBIAL ARTERY AND DRUG COATED BALLOON ANGIOPLASTY OF POPLITEAL ARTERY;  Surgeon: Nada Libman, MD;  Location: [facility] OR;  Service: Vascular;  Laterality: Left;   cancer removal   2023   below the right base of ear at [facility]   COLONOSCOPY     CORONARY ARTERY BYPASS GRAFT N/A 02/26/1993   Procedure: CORONARY ARTERY BYPASS GRAFT; Location: [facility]; Surgeon: Marshell Garfinkel, MD   ENDARTERECTOMY FEMORAL Left 01/22/2024   Procedure: LEFT COMMON FEMORAL ENDARTERECTOMY WITH VEIN PATCH;  Surgeon: Nada Libman, MD;  Location: [facility] OR;  Service: Vascular;  Laterality: Left;   FEMORAL-FEMORAL BYPASS GRAFT Left 01/22/2024   Procedure: BYPASS GRAFT LEFT COMMON FEMORAL TO PROFUNDA  ARTERY USING DACRON GRAFT;  Surgeon: Nada Libman, MD;  Location: [facility] OR;  Service: Vascular;  Laterality: Left;   HERNIA REPAIR     LOWER EXTREMITY ANGIOGRAPHY Left 08/30/2021   Procedure: LOWER EXTREMITY ANGIOGRAPHY;  Surgeon: Renford Dills, MD;  Location: [facility] INVASIVE CV LAB;  Service: Cardiovascular;  Laterality: Left;   LOWER EXTREMITY ANGIOGRAPHY Left 11/10/2023   Procedure: Lower Extremity Angiography;  Surgeon: Renford Dills, MD;  Location: [facility] INVASIVE CV LAB;  Service: Cardiovascular;  Laterality: Left;     Home Medications   Prior to Admission medications   Medication Sig Start Date End Date Taking? Authorizing Provider  acetaminophen (TYLENOL) 325 MG tablet Take  650 mg by mouth in the morning and at bedtime.    [provider]  apixaban (ELIQUIS) 2.5 MG TABS tablet Take 1 tablet (2.5 mg total) by mouth 2 (two) times daily. 01/26/24   Emilie Rutter, PA-C  aspirin EC 81 MG tablet Take 1 tablet (81 mg total) by mouth daily. Swallow whole. 01/26/24   Emilie Rutter, PA-C  cyanocobalamin (VITAMIN B12) 1000 MCG tablet Take 1,000 mcg by mouth in the morning.    [provider]  ferrous sulfate 325 (65 FE) MG EC tablet Take 325 mg by mouth 2 (two) times a week. Tuesday and Thursday in the morning    [provider]  levothyroxine (SYNTHROID, LEVOTHROID) 112 MCG tablet Take 112 mcg by mouth daily. 03/28/14   [provider]  metoprolol succinate (TOPROL-XL) 25 MG 24 hr tablet Take 25 mg by mouth daily. 10/21/23   [provider]  ondansetron (ZOFRAN) 8 MG tablet Take 1 tablet (8 mg total) by mouth every 8 (eight) hours as  needed for nausea or vomiting. 03/09/24   Earna Coder, MD  simvastatin (ZOCOR) 40 MG tablet Take 40 mg by mouth daily. 03/28/14   [provider]  torsemide (DEMADEX) 10 MG tablet Take 10 mg by mouth in the morning.    [provider]     Allergies  Cephalexin, Spironolactone, Codeine, and Doxycycline   Family History   Family History  Problem Relation Age of Onset   Hypertension Mother    Diabetes Brother    Stroke Brother    Heart attack Brother    Esophageal cancer Daughter    Colon cancer Daughter      Physical Exam  Triage Vital Signs: ED Triage Vitals  Encounter Vitals Group     BP 03/14/24 2356 125/83     Systolic BP Percentile --      Diastolic BP Percentile --      Pulse Rate 03/14/24 2356 92     Resp 03/14/24 2356 (!) 22     Temp 03/14/24 2356 (!) 97.3 F (36.3 C)     Temp Source 03/14/24 2356 Oral     SpO2 03/14/24 2356 98 %     Weight 03/14/24 2358 136 lb 9.6 oz (62 kg)     Height --      Head Circumference --      Peak Flow --      Pain Score 03/14/24 2358 10     Pain Loc --      Pain Education --      Exclude from Growth Chart --     Updated Vital Signs: BP 96/62 (BP Location: Right Arm)   Pulse 99   Temp 97.6 F (36.4 C) (Oral)   Resp 18   Wt 62 kg   SpO2 99%   BMI 21.39 kg/m    General: Awake, moderate distress.  CV:  RRR.  Good peripheral perfusion.  Resp:  Increased effort.  CTAB. Abd:  Mild to moderate lower abdominal tenderness to palpation without rebound or guarding.  No distention.  Other:  Significant ecchymosis to right lower chest and abdomen.   ED Results / Procedures / Treatments  Labs (all labs ordered are listed, but only abnormal results are displayed) Labs Reviewed  CBC WITH DIFFERENTIAL/PLATELET - Abnormal; Notable for the following components:      Result Value   WBC 16.3 (*)    RBC 3.65 (*)    Hemoglobin 11.0 (*)    HCT  34.3 (*)    RDW 21.6 (*)    nRBC 0.3 (*)    Neutro Abs 13.8  (*)    Monocytes Absolute 1.1 (*)    Abs Immature Granulocytes 0.10 (*)    All other components within normal limits  COMPREHENSIVE METABOLIC PANEL - Abnormal; Notable for the following components:   Sodium 133 (*)    Glucose, Bld 174 (*)    BUN 32 (*)    GFR, Estimated 60 (*)    All other components within normal limits  URINALYSIS, ROUTINE W REFLEX MICROSCOPIC - Abnormal; Notable for the following components:   Color, Urine YELLOW (*)    APPearance CLEAR (*)    All other components within normal limits  LACTIC ACID, PLASMA - Abnormal; Notable for the following components:   Lactic Acid, Venous 2.1 (*)    All other components within normal limits  TROPONIN I (HIGH SENSITIVITY) - Abnormal; Notable for the following components:   Troponin I (High Sensitivity) 19 (*)    All other components within normal limits  TROPONIN I (HIGH SENSITIVITY) - Abnormal; Notable for the following components:   Troponin I (High Sensitivity) 20 (*)    All other components within normal limits  CULTURE, BLOOD (ROUTINE X 2)  CULTURE, BLOOD (ROUTINE X 2)  LIPASE, BLOOD  LACTIC ACID, PLASMA     EKG  ED ECG REPORT I, Subrina Vecchiarelli J, the attending physician, personally viewed and interpreted this ECG.   Date: 03/15/2024  EKG Time: 0058  Rate: 104  Rhythm: atrial fibrillation, rate 104  Axis: Normal  Intervals:none  ST&T Change: Nonspecific    RADIOLOGY I have independently visualized and interpreted patient's imaging studies as well as noted the radiology interpretation:  CT abdomen/pelvis: SBO, likely developing small bowel ischemia  Official radiology report(s): CT CHEST ABDOMEN PELVIS W CONTRAST Result Date: 03/15/2024 CLINICAL DATA:  Abdominal pain, nausea, vomiting, ecchymosis in the right chest, abdomen, and pelvis. On anticoagulant. EXAM: CT CHEST, ABDOMEN, AND PELVIS WITH CONTRAST TECHNIQUE: Multidetector CT imaging of the chest, abdomen and pelvis was performed following the standard  protocol during bolus administration of intravenous contrast. RADIATION DOSE REDUCTION: This exam was performed according to the departmental dose-optimization program which includes automated exposure control, adjustment of the mA and/or kV according to patient size and/or use of iterative reconstruction technique. CONTRAST:  OMNIPAQUE IOHEXOL 300 MG/ML  SOLN COMPARISON:  PET/CT 07/24/2023; CT abdomen pelvis 04/27/2023 FINDINGS: CT CHEST FINDINGS Cardiovascular: Cardiomegaly. No pericardial effusion. Coronary artery and aortic atherosclerotic calcification. Mediastinum/Nodes: No enlarged mediastinal, hilar, or axillary lymph nodes. Thyroid gland, trachea, and esophagus demonstrate no significant findings. Lungs/Pleura: Small right pleural effusion and compressive atelectasis in the right lower lobe. Additional atelectasis in the left lower lobe. No pneumothorax. Centrilobular micro nodules in the right middle lobe compatible with small airway infection/inflammation. Musculoskeletal: Compression fracture of T12 is increased compared to 07/24/2023. There is 40% vertebral body height loss anteriorly. No retropulsion. CT ABDOMEN PELVIS FINDINGS Hepatobiliary: Cholelithiasis. No evidence of acute cholecystitis. No biliary dilation. No acute [place] abnormality. Pancreas: Unremarkable. Spleen: Unremarkable. Adrenals/Urinary Tract: Normal adrenal glands. Atrophic native kidneys. No urinary calculi or hydronephrosis. Unremarkable bladder. Stomach/Bowel: Stomach is within normal limits. Diffuse dilation of the small bowel with transition point in the anterior left abdomen (circa series 5/image 39). There is swirling of vessels in the central mesentery. The dilated bowel demonstrates mild wall thickening. Adjacent mesenteric stranding, interloop free fluid and vascular congestion. Bowel ischemia from internal hernia or closed loop obstruction  are not excluded. No pneumatosis. Normal colon without wall thickening.  Normal appendix. Vascular/Lymphatic: Aortic atherosclerotic calcification. No lymphadenopathy. Reproductive: No acute abnormality. Other: Small volume abdominopelvic ascites. No free intraperitoneal air. Musculoskeletal: 9 no acute fracture. IMPRESSION: 1. Small-bowel obstruction with transition point in the anterior left abdomen. There is swirling of vessels in the central mesentery. The dilated bowel demonstrates mild wall thickening. Adjacent mesenteric stranding, interloop free fluid and vascular congestion. Developing small bowel ischemia from internal hernia or closed loop obstruction are not excluded. Surgical consult recommended. 2. Small right pleural effusion and compressive atelectasis in the right lower lobe. 3. Centrilobular micro nodules in the right middle lobe compatible with small airway infection/inflammation. 4. Compression fracture of T12 is increased compared to 07/24/2023. 5. Aortic Atherosclerosis (ICD10-I70.0). Electronically Signed   By: Minerva Fester M.D.   On: 03/15/2024 01:41     PROCEDURES:  Critical Care performed: Yes, see critical care procedure note(s)  CRITICAL CARE Performed by: Irean Hong   Total critical care time: 45 minutes  Critical care time was exclusive of separately billable procedures and treating other patients.  Critical care was necessary to treat or prevent imminent or life-threatening deterioration.  Critical care was time spent personally by me on the following activities: development of treatment plan with patient and/or surrogate as well as nursing, discussions with consultants, evaluation of patient's response to treatment, examination of patient, obtaining history from patient or surrogate, ordering and performing treatments and interventions, ordering and review of laboratory studies, ordering and review of radiographic studies, pulse oximetry and re-evaluation of patient's condition.   Marland Kitchen1-3 Lead EKG Interpretation  Performed by: Irean Hong, MD Authorized by: Irean Hong, MD     Interpretation: normal     ECG rate:  90   ECG rate assessment: normal     Rhythm: sinus rhythm     Ectopy: none     Conduction: normal   Comments:     Patient placed on cardiac monitor to evaluate for arrhythmias    MEDICATIONS ORDERED IN ED: Medications  metoprolol succinate (TOPROL-XL) 24 hr tablet 25 mg (has no administration in time range)  simvastatin (ZOCOR) tablet 40 mg (has no administration in time range)  levothyroxine (SYNTHROID) tablet 112 mcg (has no administration in time range)  acetaminophen (TYLENOL) tablet 650 mg (has no administration in time range)    Or  acetaminophen (TYLENOL) suppository 650 mg (has no administration in time range)  ondansetron (ZOFRAN) tablet 4 mg (has no administration in time range)    Or  ondansetron (ZOFRAN) injection 4 mg (has no administration in time range)  morphine (PF) 2 MG/ML injection 2 mg (has no administration in time range)  piperacillin-tazobactam (ZOSYN) IVPB 3.375 g (has no administration in time range)  sodium chloride 0.9 % bolus 500 mL (0 mLs Intravenous Stopped 03/15/24 0128)  fentaNYL (SUBLIMAZE) injection 25 mcg (25 mcg Intravenous Given 03/15/24 0108)  ondansetron (ZOFRAN) injection 4 mg (4 mg Intravenous Not Given 03/15/24 0119)  iohexol (OMNIPAQUE) 300 MG/ML solution 100 mL (100 mLs Intravenous Contrast Given 03/15/24 0120)  piperacillin-tazobactam (ZOSYN) IVPB 3.375 g (0 g Intravenous Stopped 03/15/24 0310)  fentaNYL (SUBLIMAZE) injection 25 mcg (25 mcg Intravenous Given 03/15/24 0217)     IMPRESSION / MDM / ASSESSMENT AND PLAN / ED COURSE  I reviewed the triage vital signs and the nursing notes.  89 year old male presenting with lower abdominal pain and emesis. Differential diagnosis includes, but is not limited to, acute appendicitis, renal colic, testicular torsion, urinary tract infection/pyelonephritis, prostatitis,  epididymitis,  diverticulitis, small bowel obstruction or ileus, colitis, abdominal aortic aneurysm, gastroenteritis, hernia, etc. I personally reviewed patient's records and note oncology office visit from 03/08/2024 for follow-up SCCIS of cheek.  Patient's presentation is most consistent with acute presentation with potential threat to life or bodily function.  The patient is on the cardiac monitor to evaluate for evidence of arrhythmia and/or significant heart rate changes.  Laboratory results demonstrate moderate leukocytosis with WBC 16.3, improved hemoglobin of 11 from several days ago.  CMP and lipase unremarkable.  Mildly elevated troponin likely secondary to demand ischemia.  UA is negative.  Patient vomited moderate to large amount of emesis, does not appear bloody.  Denies fall or trauma.  Given significant old ecchymosis to right chest and abdomen, will obtain CT scans.  Administer low-dose fentanyl for pain.  Will reassess.  Clinical Course as of 03/15/24 0431  Tue Mar 15, 2024  0202 Discussed CT scan with Dr. Maurine Minister from general surgery who recommends NG tube insertion, empiric antibiotic coverage, admit to hospital services. [JS]  0214 Appreciate pharmacy recommendation on antibiotic choice [JS]    Clinical Course User Index [JS] Irean Hong, MD     FINAL CLINICAL IMPRESSION(S) / ED DIAGNOSES   Final diagnoses:  Nausea and vomiting, unspecified vomiting type  Lower abdominal pain  SBO (small bowel obstruction) (HCC)  Small bowel ischemia (HCC)     Rx / DC Orders   ED Discharge Orders     None        Note:  This document was prepared using Dragon voice recognition software and may include unintentional dictation errors.   Irean Hong, MD 03/15/24 [phone]

## 2024-03-15 NOTE — ED Notes (Signed)
 Failed attempt to insert NG. Pt refusing another attempt at this time. Para March, md notified.

## 2024-03-15 NOTE — Assessment & Plan Note (Signed)
 Continue home meds

## 2024-03-15 NOTE — Anesthesia Procedure Notes (Signed)
 Arterial Line Insertion Start/End3/18/2025 11:36 AM, 03/15/2024 11:36 AM Performed by: Foye Deer, MD, Monico Hoar, CRNA, CRNA  Patient location: OR. Preanesthetic checklist: patient identified, IV checked, site marked, risks and benefits discussed, surgical consent, monitors and equipment checked, pre-op evaluation, timeout performed and anesthesia consent Lidocaine 1% used for infiltration Left, radial was placed Catheter size: 20 G Hand hygiene performed  and maximum sterile barriers used   Attempts: 1 Procedure performed without using ultrasound guided technique. Following insertion, dressing applied. Post procedure assessment: normal and unchanged  Patient tolerated the procedure well with no immediate complications.

## 2024-03-15 NOTE — Assessment & Plan Note (Signed)
 Continue home meds with small sips

## 2024-03-15 NOTE — Consult Note (Signed)
 [facility] SURGICAL CONSULTATION NOTE (initial) - cpt: [id number]   HISTORY OF PRESENT ILLNESS (HPI):  89 y.o. male presented to [facility] ED overnight for evaluation of abdominal pain. Patient reports the acute onset of lower abdominal pain that started yesterday afternoon. This seems to be worse in the suprapubic region. Accompanied with nausea and emesis. No noted fever, chills, CP. He does endorse a recent cough. He has not had a bowel movement in 3-4 days reportedly. Of note, he has a significant PMHx including recent admission in January for critical limb ischemia for which he required femoral endarterectomy, CHF (EF 35-40% in 2024), CAD s/p CABG, and on chronic anticoagulation with Eliquis. 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 Only previous abdominal surgery appears to be inguinal hernia repair at some point. From a functional standpoint, he reports that he has not been able to walk in some time. He is undergoing therapy at his ALF for this. Work up in the ED revealed a leukocytosis to 16.3K, Hgb to 11.0, sCr - 1.15, and initial venous lactate 2.1 (repeat 2.4). He did undergo CT Chest/Abdomen/Pelvis which was concerning for SBO with mesenteric changes concerning for vascular congestion. May also have right sided PNA vs atelectasis. NGT placement was attempted but patient unable to tolerate this. He is on Zosyn.   Surgery is consulted by emergency medicine physician Dr. Chiquita Loth, MD in this context for evaluation and management of SBO.   PAST MEDICAL HISTORY (PMH):  Past Medical History:  Diagnosis Date   A-fib [facility])    a.) CHA2DS2VASc = 5 (age x 2, CHF, HTN, previous MI/vascular disease history);  b.) s/p DCCV 03/05/1993; c.) rate/rhythm maintained on oral metoprolol succinate; chronically anticoagulated with dose reduced apixaban   Aortic atherosclerosis (HCC)    B12 deficiency    CHF (congestive heart  failure) (HCC)    a.) TTE 11/05/12: EF 25-35, dist sep AK, mild MR; b.) TTE 05/10/14: EF 30, mild LVH, mod BAE, mild AR/PR, mod MR/TR; c.) TTE 03/12/20: EF 20, sev glob HK, mod BAE, mild LAE, mod RAE, triv PR, mild AR, sev MR/TR; d.) TTE 08/21/21: EF 30, sev glob HK, apical dyskinesis, mild LVH, mod BAE, triv AR/PR, mod MR, sev TR; e.) TTE 09/25/23: EF 35-40,  diff AK/HK, G2DD, sev BAE, RVE, RVSP 66.3, mild MR, AoV calc   Cor pulmonale (HCC)    Coronary artery disease 02/21/1993   a.) LHC 02/21/1993 : 95% pLAD, 75% OM1, 50% OM3, 25-50% pRCA, 50% large anterolateral lesion --> transfer to [facility] for CABG; b.) s/p 2v CABG 02/26/1993 (LIMA-LAD, SVG-OM1)   Family history of adverse reaction to anesthesia    a.) PONV in 1st degree relative (daughter)   GERD (gastroesophageal reflux disease)    History of [place] spotted fever    HLD (hyperlipidemia)    HTN (hypertension), benign 04/25/2014   Hypothyroidism    Iron deficiency anemia    On apixaban therapy    Posterolateral myocardial infarction (HCC) 02/21/1993   LHC 02/21/1993 : 95% pLAD, 75% OM1, 50% OM3, 25-50% pRCA, 50% large anterolateral lesion --> transfer to [facility] for [facility]G; b.) s/p 2v CABG 02/26/1993 (LIMA-LAD, SVG-OM1)   Pulmonary HTN (HCC)    a.) TTE 05/10/2014: RVSP 48.6; b.) TTE 03/12/2020: RVSP 73.3; c.) TTE  09/25/2023: RVSP 66.3   PVD (peripheral vascular disease) (HCC)    S/P CABG x 2 02/26/1993   a.) LIMA-LAD, SVG-OM1 --> complicated by atrial flutter  on POD4 --> Tx'd with digoxin + procainamide with no resolution. POD5 diltiazem gtt added with no improvement. Ultimately required DCCV on 03/05/1993.   Squamous cell carcinoma of skin 11/18/2021   right ear and mandible, EDC   Squamous cell carcinoma of skin 03/18/2022   right mandible and ear/refer for Mohs/ Dr. Adriana Simas has referred pt to Dr. Nedra Hai in otolaryngology excised by Dr. Nedra Hai 05/03/22, Radiation with Dr. Rushie Chestnut   Type 2 diabetes mellitus with peripheral angiopathy (HCC)  11/10/2018     PAST SURGICAL HISTORY (PSH):  Past Surgical History:  Procedure Laterality Date   ANGIOPLASTY Left 01/22/2024   Procedure: BALLOON ANGIOPLASTY OF TIBIAL PERONEAL TRUNK AND POSTERIOR TBIAL ARTERY AND DRUG COATED BALLOON ANGIOPLASTY OF POPLITEAL ARTERY;  Surgeon: Nada Libman, MD;  Location: [facility] OR;  Service: Vascular;  Laterality: Left;   cancer removal   2023   below the right base of ear at [facility]   COLONOSCOPY     CORONARY ARTERY BYPASS GRAFT N/A 02/26/1993   Procedure: CORONARY ARTERY BYPASS GRAFT; Location: [facility]; Surgeon: Marshell Garfinkel, MD   ENDARTERECTOMY FEMORAL Left 01/22/2024   Procedure: LEFT COMMON FEMORAL ENDARTERECTOMY WITH VEIN PATCH;  Surgeon: Nada Libman, MD;  Location: [facility] OR;  Service: Vascular;  Laterality: Left;   FEMORAL-FEMORAL BYPASS GRAFT Left 01/22/2024   Procedure: BYPASS GRAFT LEFT COMMON FEMORAL TO PROFUNDA  ARTERY USING DACRON GRAFT;  Surgeon: Nada Libman, MD;  Location: [facility] OR;  Service: Vascular;  Laterality: Left;   HERNIA REPAIR     LOWER EXTREMITY ANGIOGRAPHY Left 08/30/2021   Procedure: LOWER EXTREMITY ANGIOGRAPHY;  Surgeon: Renford Dills, MD;  Location: [facility] INVASIVE CV LAB;  Service: Cardiovascular;  Laterality: Left;   LOWER EXTREMITY ANGIOGRAPHY Left 11/10/2023   Procedure: Lower Extremity Angiography;  Surgeon: Renford Dills, MD;  Location: [facility] INVASIVE CV LAB;  Service: Cardiovascular;  Laterality: Left;     MEDICATIONS:  Prior to Admission medications   Medication Sig Start Date End Date Taking? Authorizing Provider  acetaminophen (TYLENOL) 325 MG tablet Take 650 mg by mouth in the morning and at bedtime.    [provider]  apixaban (ELIQUIS) 2.5 MG TABS tablet Take 1 tablet (2.5 mg total) by mouth 2 (two) times daily. 01/26/24   Emilie Rutter, PA-C  aspirin EC 81 MG tablet Take 1 tablet (81 mg total) by mouth daily. Swallow whole. 01/26/24   Emilie Rutter, PA-C  cyanocobalamin (VITAMIN B12) 1000  MCG tablet Take 1,000 mcg by mouth in the morning.    [provider]  ferrous sulfate 325 (65 FE) MG EC tablet Take 325 mg by mouth 2 (two) times a week. Tuesday and Thursday in the morning    [provider]  levothyroxine (SYNTHROID, LEVOTHROID) 112 MCG tablet Take 112 mcg by mouth daily. 03/28/14   [provider]  metoprolol succinate (TOPROL-XL) 25 MG 24 hr tablet Take 25 mg by mouth daily. 10/21/23   [provider]  ondansetron (ZOFRAN) 8 MG tablet Take 1 tablet (8 mg total) by mouth every 8 (eight) hours as needed for nausea or vomiting. 03/09/24   Earna Coder, MD  simvastatin (ZOCOR) 40 MG tablet Take 40 mg by mouth daily. 03/28/14   [provider]  torsemide (DEMADEX) 10 MG tablet Take 10 mg by mouth in the morning.    [provider]     ALLERGIES:  Allergies  Allergen Reactions   Cephalexin Rash   Spironolactone Nausea Only  Codeine Rash and Other (See Comments)    Can not sleep   Doxycycline Rash     SOCIAL HISTORY:  Social History   Socioeconomic History   Marital status: Widowed    Spouse name: Not on file   Number of children: 2   Years of education: Not on file   Highest education level: Not on file  Occupational History   Not on file  Tobacco Use   Smoking status: Never   Smokeless tobacco: Never  Vaping Use   Vaping status: Never Used  Substance and Sexual Activity   Alcohol use: No   Drug use: No   Sexual activity: Not on file  Other Topics Concern   Not on file  Social History Narrative   Lives by himself: daughter penny lives in [place]. Daughter [name] lives in town   Social Drivers of Health   Financial Resource Strain: Low Risk  (09/22/2023)   Received from [facility]   Overall Financial Resource Strain (CARDIA)    Difficulty of Paying Living Expenses: Not hard at all  Food Insecurity: No Food Insecurity (02/09/2024)   Hunger Vital Sign    Worried About Running  Out of Food in the Last Year: Never true    Ran Out of Food in the Last Year: Never true  Transportation Needs: No Transportation Needs (02/09/2024)   PRAPARE - Administrator, Civil Service (Medical): No    Lack of Transportation (Non-Medical): No  Physical Activity: Not on file  Stress: Not on file  Social Connections: Unknown (01/22/2024)   Social Connection and Isolation Panel [NHANES]    Frequency of Communication with Friends and Family: Patient unable to answer    Frequency of Social Gatherings with Friends and Family: Patient unable to answer    Attends Religious Services: Not on file    Active Member of Clubs or Organizations: Not on file    Attends Banker Meetings: Not on file    Marital Status: Patient unable to answer  Intimate Partner Violence: Not At Risk (02/09/2024)   Humiliation, Afraid, Rape, and Kick questionnaire    Fear of Current or Ex-Partner: No    Emotionally Abused: No    Physically Abused: No    Sexually Abused: No     FAMILY HISTORY:  Family History  Problem Relation Age of Onset   Hypertension Mother    Diabetes Brother    Stroke Brother    Heart attack Brother    Esophageal cancer Daughter    Colon cancer Daughter       REVIEW OF SYSTEMS:  Review of Systems  Constitutional:  Negative for chills and fever.  Respiratory:  Positive for cough. Negative for shortness of breath.   Cardiovascular:  Negative for chest pain and palpitations.  Gastrointestinal:  Positive for abdominal pain, nausea and vomiting. Negative for constipation and diarrhea.  Genitourinary:  Negative for dysuria and urgency.  All other systems reviewed and are negative.   VITAL SIGNS:  Temp:  [97.3 F (36.3 C)-97.6 F (36.4 C)] 97.6 F (36.4 C) (03/18 0407) Pulse Rate:  [68-99] 68 (03/18 0600) Resp:  [18-22] 19 (03/18 0600) BP: (96-125)/(62-83) 103/62 (03/18 0600) SpO2:  [93 %-99 %] 97 % (03/18 0600) Weight:  [62 kg] 62 kg (03/17 2358)        Weight: 62 kg BMI (Calculated): 21.39   INTAKE/OUTPUT:  No intake/output data recorded.  PHYSICAL EXAM:  Physical Exam Vitals and nursing note reviewed. Exam  conducted with a chaperone present.  Constitutional:      General: He is not in acute distress.    Appearance: Normal appearance. He is not ill-appearing.     Comments: Patient sitting up in bed; NAD  HENT:     Head: Normocephalic and atraumatic.     Comments: Right cheek ulceration secondary to history of SCC Eyes:     General: No scleral icterus.    Conjunctiva/sclera: Conjunctivae normal.  Cardiovascular:     Rate and Rhythm: Tachycardia present. Rhythm irregularly irregular.     Comments: Atrial fibrillation, 90-120 bpm Pulmonary:     Effort: Pulmonary effort is normal. No respiratory distress.  Chest:     Comments: Large area of ecchymosis to the right lateral flank and chest wall  Abdominal:     General: There is distension.     Palpations: Abdomen is soft.     Tenderness: There is abdominal tenderness in the suprapubic area. There is no guarding or rebound.     Comments: Abdomen is soft, distended and tympanic, he is tender somewhat throughout but this is worse in the suprapubic region and LLQ.   Genitourinary:    Comments: Deferred Skin:    General: Skin is warm and dry.  Neurological:     General: No focal deficit present.     Mental Status: He is alert. Mental status is at baseline.  Psychiatric:        Mood and Affect: Mood normal.        Behavior: Behavior normal.      Labs:     Latest Ref Rng & Units 03/15/2024   12:13 AM 03/08/2024    8:39 AM 02/15/2024    8:51 AM  CBC  WBC 4.0 - 10.5 K/uL 16.3  14.4  9.9   Hemoglobin 13.0 - 17.0 g/dL 95.6  8.9  9.4   Hematocrit 39.0 - 52.0 % 34.3  27.5  29.7   Platelets 150 - 400 K/uL 363  386  282       Latest Ref Rng & Units 03/15/2024   12:13 AM 03/08/2024    8:39 AM 02/15/2024    8:52 AM  CMP  Glucose 70 - 99 mg/dL 387  564  73   BUN 8 - 23 mg/dL 32  15   19   Creatinine 0.61 - 1.24 mg/dL 3.32  9.51  8.84   Sodium 135 - 145 mmol/L 133  136  137   Potassium 3.5 - 5.1 mmol/L 3.6  3.8  3.5   Chloride 98 - 111 mmol/L 98  102  101   CO2 22 - 32 mmol/L 24  26  25    Calcium 8.9 - 10.3 mg/dL 9.0  8.5  8.8   Total Protein 6.5 - 8.1 g/dL 7.6  7.2  8.1   Total Bilirubin 0.0 - 1.2 mg/dL 1.2  1.0  0.7   Alkaline Phos 38 - 126 U/L 51  50  61   AST 15 - 41 U/L 31  27  21    ALT 0 - 44 U/L 19  13  10       Imaging studies:   CT Chest/Abdomen/Pelvis (03/15/2024) personally reviewed with small bowel dilation and mesenteric changes concerning for vascular congestion, no gross pneumatosis, no pneumoperitoneum, and radiologist report reviewed below:  IMPRESSION: 1. Small-bowel obstruction with transition point in the anterior left abdomen. There is swirling of vessels in the central mesentery. The dilated bowel demonstrates mild wall thickening. Adjacent mesenteric stranding,  interloop free fluid and vascular congestion. Developing small bowel ischemia from internal hernia or closed loop obstruction are not excluded. Surgical consult recommended. 2. Small right pleural effusion and compressive atelectasis in the right lower lobe. 3. Centrilobular micro nodules in the right middle lobe compatible with small airway infection/inflammation. 4. Compression fracture of T12 is increased compared to 07/24/2023. 5. Aortic Atherosclerosis (ICD10-I70.0).   Assessment/Plan: (ICD-10's: K74.609) 89 y.o. male with abdominal pain and vomiting found to have likely SBO with mesenteric changes, complicated by pertinent comorbidities including advanced age, deconditioning, significant cardiac history on Eliquis, and SCC currently on immunotherapy.   - From an abdominal perspective, I am concerned about the appearance of his bowel and mesentery on CT. He is certainly tender on examination but not overtly peritonitic. He does also have a leukocytosis but this could also be  secondary to possible PNA. Regardless, the appearance of his bowel and presentation is alarming certainly with lack of significant intra-abdominal procedures. I am concerned that he may need more urgent operative interventions. He is certainly at extremely high risk for peri-operative morbidity and mortality, (ex: need for ventilator support post-operative, need for multiple procedures, bleeding, infection, bowel injuries, need for ostomy, further debilitation, stroke, MI, death). I will also attempt to discuss this with patient's daughter, [name], who is reportedly POA.  - For now, I do think we need to try and re-attempt insertion of NGT.  - He certainly needs to be NPO.  - Agree with Abx coverage (zosyn).  - Closely monitor abdominal examination.  - Serial KUB as needed; low threshold for gastrografin challenge if elects for conservative management.   - Appreciate medicine admission   All of the above findings and recommendations were discussed with the patient, and all of patient's questions were answered to his expressed satisfaction.  Thank you for the opportunity to participate in this patient's care.   Face-to-face time spent with the patient and care providers was 45 minutes, with more than 50% of the time spent counseling, educating, and coordinating care of the patient.    -- Lynden Oxford, PA-C [facility] 03/15/2024, 7:15 AM M-F: 7am - 4pm

## 2024-03-15 NOTE — ED Notes (Signed)
 Patient transferred off floor.

## 2024-03-15 NOTE — Hospital Course (Signed)
 Marland Kitchen

## 2024-03-15 NOTE — Transfer of Care (Signed)
 Immediate Anesthesia Transfer of Care Note  Patient: [name] [name]  Procedure(s) Performed: LAPAROTOMY, EXPLORATORY, REDUCTION OF VOLVULUS  Patient Location: PACU  Anesthesia Type:General  Level of Consciousness: awake and alert   Airway & Oxygen Therapy: Patient Spontanous Breathing and Patient connected to face mask oxygen  Post-op Assessment: Report given to RN and Post -op Vital signs reviewed and stable  Post vital signs: Reviewed and stable  Last Vitals:  Vitals Value Taken Time  BP 132/79 03/15/24 1315  Temp    Pulse 110   Resp 18 03/15/24 1317  SpO2 96   Vitals shown include unfiled device data.  Last Pain:  Vitals:   03/15/24 1027  TempSrc: Oral  PainSc: 0-No pain         Complications: No notable events documented.

## 2024-03-15 NOTE — Progress Notes (Signed)
 Pharmacy Antibiotic Note  [name] [name] is a 89 y.o. male admitted on 03/14/2024 with intra-abdominal infection.  Pharmacy has been consulted for Zosyn dosing.  Plan: Zosyn 3.375g IV q8h (4 hour infusion).  Pharmacy will continue to follow and will adjust abx dosing whenever warranted.  Temp (24hrs), Avg:97.3 F (36.3 C), Min:97.3 F (36.3 C), Max:97.3 F (36.3 C)   Recent Labs  Lab 03/08/24 0839 03/15/24 0013 03/15/24 0156  WBC 14.4* 16.3*  --   CREATININE 1.06 1.15  --   LATICACIDVEN  --   --  2.1*    Estimated Creatinine Clearance: 35.9 mL/min (by C-G formula based on SCr of 1.15 mg/dL).    Allergies  Allergen Reactions   Cephalexin Rash   Spironolactone Nausea Only   Codeine Rash and Other (See Comments)    Can not sleep   Doxycycline Rash    Antimicrobials this admission: 3/18 Zosyn >>   Microbiology results: 3/18 BCx: Pending  Thank you for allowing pharmacy to be a part of this patient's care.  Otelia Sergeant, PharmD, [facility] 03/15/2024 3:15 AM

## 2024-03-15 NOTE — ED Notes (Addendum)
 Pt called out c/o a nagging non-productive cough. Para March, MD made aware, no new orders received. Surgery to see per Para March, md.

## 2024-03-15 NOTE — Anesthesia Procedure Notes (Signed)
 Procedure Name: Intubation Date/Time: 03/15/2024 11:42 AM  Performed by: Monico Hoar, CRNAPre-anesthesia Checklist: Patient identified, Patient being monitored, Timeout performed, Emergency Drugs available and Suction available Patient Re-evaluated:Patient Re-evaluated prior to induction Oxygen Delivery Method: Circle system utilized Preoxygenation: Pre-oxygenation with 100% oxygen Induction Type: IV induction Ventilation: Mask ventilation without difficulty Laryngoscope Size: Glidescope and 4 Grade View: Grade I Tube type: Oral Tube size: 7.0 mm Number of attempts: 1 Airway Equipment and Method: Stylet Placement Confirmation: ETT inserted through vocal cords under direct vision, positive ETCO2 and breath sounds checked- equal and bilateral Secured at: 23 cm Tube secured with: Tape Dental Injury: Teeth and Oropharynx as per pre-operative assessment

## 2024-03-15 NOTE — Assessment & Plan Note (Addendum)
 History of lower extremity revascularization in January 2025 Chronic anticoagulation Holding apixaban and aspirin in case of procedure Continue metoprolol and simvastatin

## 2024-03-15 NOTE — ED Notes (Signed)
 Pt had one episode of vomiting... see mar. Dolores Frame, md aware. Pt to radiology at this time.

## 2024-03-16 ENCOUNTER — Encounter: Payer: Self-pay | Admitting: General Surgery

## 2024-03-16 DIAGNOSIS — K56609 Unspecified intestinal obstruction, unspecified as to partial versus complete obstruction: Secondary | ICD-10-CM | POA: Diagnosis not present

## 2024-03-16 LAB — CBC
HCT: 25.7 % — ABNORMAL LOW (ref 39.0–52.0)
Hemoglobin: 8.5 g/dL — ABNORMAL LOW (ref 13.0–17.0)
MCH: 30.7 pg (ref 26.0–34.0)
MCHC: 33.1 g/dL (ref 30.0–36.0)
MCV: 92.8 fL (ref 80.0–100.0)
Platelets: 220 10*3/uL (ref 150–400)
RBC: 2.77 MIL/uL — ABNORMAL LOW (ref 4.22–5.81)
RDW: 21.6 % — ABNORMAL HIGH (ref 11.5–15.5)
WBC: 26.2 10*3/uL — ABNORMAL HIGH (ref 4.0–10.5)
nRBC: 0.1 % (ref 0.0–0.2)

## 2024-03-16 LAB — BASIC METABOLIC PANEL
Anion gap: 5 (ref 5–15)
BUN: 38 mg/dL — ABNORMAL HIGH (ref 8–23)
CO2: 25 mmol/L (ref 22–32)
Calcium: 7.3 mg/dL — ABNORMAL LOW (ref 8.9–10.3)
Chloride: 108 mmol/L (ref 98–111)
Creatinine, Ser: 1.24 mg/dL (ref 0.61–1.24)
GFR, Estimated: 55 mL/min — ABNORMAL LOW (ref >60)
Glucose, Bld: 120 mg/dL — ABNORMAL HIGH (ref 70–99)
Potassium: 3.8 mmol/L (ref 3.5–5.1)
Sodium: 138 mmol/L (ref 135–145)

## 2024-03-16 MED ORDER — SIMVASTATIN 20 MG PO TABS
40.0000 mg | ORAL_TABLET | Freq: Every evening | ORAL | Status: DC
  Administered 2024-03-16 – 2024-04-02 (×18): 40 mg via ORAL
  Filled 2024-03-16 (×18): qty 2

## 2024-03-16 MED ORDER — DEXTROSE-SODIUM CHLORIDE 5-0.45 % IV SOLN: INTRAVENOUS | Status: DC

## 2024-03-16 MED ORDER — MEDIHONEY WOUND/BURN DRESSING EX PSTE
1.0000 | PASTE | Freq: Every day | CUTANEOUS | Status: DC
  Administered 2024-03-16 – 2024-04-04 (×20): 1 via TOPICAL
  Filled 2024-03-16 (×2): qty 44

## 2024-03-16 NOTE — Consult Note (Signed)
 WOC Nurse Consult Note: Reason for Consult: Left heel wound; and right neck wound Known history of malignant squamous cell neck lesion, followed by oncology Known PAD; history of recent revascularization and has been followed by outpatient wound care center. Last seen 02/12/24  Wound type: Neck; full thickness; cancerous  Left heel; Unstageable Pressure Injury in the presence of known PAD Pressure Injury POA: Yes Measurement: see nursing flow sheets Wound bed: Heel 50% black, loose non viable tissue, 50% pale pink Neck full thickness, 100% fibrinous Drainage (amount, consistency, odor) see nursing flow sheets Periwound: dry skin LEs  Dressing procedure/placement/frequency: Cleanse neck and heel wounds with Monte Fantasia Hart [place] # [phone]), pat dry Cover neck wound with single layer of xeroform gauze, and foam. Change every other day Apply Medihoney to the heel wound (patient is using hydroferra blue at home) we do not carry this product inpatient. If patient or family is adamant to use, please have them bring from home. If using Medihoney, cover with saline moist gauze dressing. Top with dry dressing, secure with kerlix. OK To use Tubigrip from home.  Offload heel at all times. Prevalon boot    Re consult if needed, will not follow at this time. Thanks  Summer Parthasarathy [employer], RN,CWOCN, CNS, CWON-AP [phone])

## 2024-03-16 NOTE — Plan of Care (Signed)

## 2024-03-16 NOTE — Progress Notes (Signed)
 Initial Nutrition Assessment  DOCUMENTATION CODES:   Severe malnutrition in context of chronic illness  INTERVENTION:   RD will add supplements with diet advancement   MVI po daily with diet advancement   Pt at high refeed risk; recommend monitor potassium, magnesium and phosphorus labs daily until stable  Daily weights   NUTRITION DIAGNOSIS:   Severe Malnutrition related to chronic illness as evidenced by severe fat depletion, severe muscle depletion.  GOAL:   Patient will meet greater than or equal to 90% of their needs  MONITOR:   Diet advancement, Labs, Weight trends, I & O's, Skin  REASON FOR ASSESSMENT:   Malnutrition Screening Tool    ASSESSMENT:   89 y.o. male with medical history significant for severe PAD s/p multiple revascularizations most recently 01/22/2024 for critical limb ischemia and on chronic anticoagulation with Eliquis, PAF, systolic CHF secondary to ischemic cardiomyopathy, GERD, CAD s/p CABG times in 1999, HTN and SCC of the cheek currently on immunotherapy who is admitted with SBO now s/p exploratory laparotomy and reduction of small bowel volvulus 3/18.  Met with pt in room today. Pt is a poor historian but reports that he is feeling ok today. Per chart, family reports pt with poor appetite and oral intake for the past week pta. Pt remains NPO in hospital. NGT removed by patient. RD will add supplements with diet advancement. Pt is at high refeed risk. Per chart, pt appears weight stable pta.   Medications reviewed and include: synthroid, NaCl w/ 5% dextrose @100ml /hr, zosyn   Labs reviewed: K 3.8 wnl, BUN 38(H) Wbc- 26.2(H), Hgb 8.5(L), Hct 25.7(L)  NUTRITION - FOCUSED PHYSICAL EXAM:  Flowsheet Row Most Recent Value  Orbital Region Severe depletion  Upper Arm Region Severe depletion  Thoracic and Lumbar Region Severe depletion  Buccal Region Severe depletion  Temple Region Severe depletion  Clavicle Bone Region Severe depletion  Clavicle  and Acromion Bone Region Severe depletion  Scapular Bone Region Severe depletion  Dorsal Hand Severe depletion  Patellar Region Severe depletion  Anterior Thigh Region Severe depletion  Posterior Calf Region Severe depletion  Edema (RD Assessment) None  Hair Reviewed  Eyes Reviewed  Mouth Reviewed  Skin Reviewed  [ecchymosis]  Nails Reviewed   Diet Order:   Diet Order             Diet NPO time specified Except for: Sips with Meds  Diet effective now                  EDUCATION NEEDS:   No education needs have been identified at this time  Skin:  Skin Assessment: Reviewed RN Assessment (Neck; full thickness; cancerous, Left heel; Unstageable Pressure Injury, incision abdomen)  Last BM:  3/18  Height:   Ht Readings from Last 1 Encounters:  03/15/24 5\' 7"  (1.702 m)    Weight:   Wt Readings from Last 1 Encounters:  03/16/24 66.4 kg    Ideal Body Weight:  67.2 kg  BMI:  Body mass index is 22.93 kg/m.  Estimated Nutritional Needs:   Kcal:  1600-1800kcal/day  Protein:  80-90g/day  Fluid:  1.6-1.8L/day  Betsey Holiday MS, RD, LDN If unable to be reached, please send secure chat to "RD inpatient" available from 8:00a-4:00p daily

## 2024-03-16 NOTE — Progress Notes (Signed)
 [facility] SURGICAL PROGRESS NOTE  Hospital Day(s): 1.   Post op day(s): 1 Day Post-Op.   Interval History:  Patient seen and examined Confusion post-operatively; pulled NGT - has not tolerated replacement He is somewhat confused this morning  Patient reports abdominal soreness; trying to st up in bed No nausea Leukocytosis worse this AM; 26.2K Hgb to 8.5 Renal function normal; sCr - 1.24: UO - 700 ccs No electrolyte derangements   Vital signs in last 24 hours: [min-max] current  Temp:  [97.5 F (36.4 C)-98.6 F (37 C)] 97.5 F (36.4 C) (03/19 0300) Pulse Rate:  [76-105] 76 (03/19 0300) Resp:  [16-21] 20 (03/18 1947) BP: (94-132)/(52-79) 102/58 (03/19 0300) SpO2:  [90 %-98 %] 94 % (03/19 0300) Weight:  [62 kg] 62 kg (03/18 1027)     Height: 5\' 7"  (170.2 cm) Weight: 62 kg BMI (Calculated): 21.4   Intake/Output last 2 shifts:  03/18 0701 - 03/19 0700 In: 670 [I.V.:670] Out: 750 [Urine:700; Blood:50]   Physical Exam:  Constitutional: alert, cooperative and no distress; confused. Sitter at bedside  Respiratory: breathing non-labored at rest  Cardiovascular: regular rate and sinus rhythm  Gastrointestinal: soft, expectedly tender, no significant distension, tympanic, no rebound/guarding Integumentary: Laparotomy is intact with staples; sanguinous drainage inferiorly  Labs:     Latest Ref Rng & Units 03/16/2024    5:32 AM 03/15/2024    8:23 AM 03/15/2024   12:13 AM  CBC  WBC 4.0 - 10.5 K/uL 26.2  18.3  16.3   Hemoglobin 13.0 - 17.0 g/dL 8.5  40.9  81.1   Hematocrit 39.0 - 52.0 % 25.7  30.8  34.3   Platelets 150 - 400 K/uL 220  335  363       Latest Ref Rng & Units 03/16/2024    5:32 AM 03/15/2024   12:13 AM 03/08/2024    8:39 AM  CMP  Glucose 70 - 99 mg/dL 914  782  956   BUN 8 - 23 mg/dL 38  32  15   Creatinine 0.61 - 1.24 mg/dL 2.13  0.86  5.78   Sodium 135 - 145 mmol/L 138  133  136   Potassium 3.5 - 5.1 mmol/L 3.8  3.6  3.8   Chloride 98 -  111 mmol/L 108  98  102   CO2 22 - 32 mmol/L 25  24  26    Calcium 8.9 - 10.3 mg/dL 7.3  9.0  8.5   Total Protein 6.5 - 8.1 g/dL  7.6  7.2   Total Bilirubin 0.0 - 1.2 mg/dL  1.2  1.0   Alkaline Phos 38 - 126 U/L  51  50   AST 15 - 41 U/L  31  27   ALT 0 - 44 U/L  19  13      Imaging studies: No new pertinent imaging studies   Assessment/Plan:  89 y.o. male 1 Day Post-Op s/p exploratory laparotomy and reduction of small bowel volvulus with hemorrhagic/edematous but viable bowel, complicated by pertinent comorbidities including advanced age, deconditioning, significant cardiac history on Eliquis, and SCC currently on immunotherapy.    - Ideally, he needs an NGT however he has been unable to tolerate this and has pulled it post-operatively. He is clinically doing reasonable this AM but of course limited by his mental acuity. For now, we will hold off on replacement. Of course, he will be at high risk for emesis and possible aspiration  - He will need to remain NPO  -  Continue IVF support  - Okay to continue foley catheter today  - Monitor abdominal examination; on-going bowel function  - May need serial KUBs for more objective reassessment  - Pain control prn; antiemetics prn  - Monitor leukocytosis - Monitor H&H - Okay to engage therapies   - Further management per primary service; we will follow    All of the above findings and recommendations were discussed with the patient, and the medical team, and all of patient's questions were answered to his expressed satisfaction.  -- Lynden Oxford, PA-C [facility] 03/16/2024, 7:30 AM M-F: 7am - 4pm

## 2024-03-16 NOTE — Anesthesia Postprocedure Evaluation (Signed)
 Anesthesia Post Note  Patient: [name] [name]  Procedure(s) Performed: LAPAROTOMY, EXPLORATORY, REDUCTION OF VOLVULUS  Patient location during evaluation: PACU Anesthesia Type: General Level of consciousness: awake and alert Pain management: pain level controlled Vital Signs Assessment: post-procedure vital signs reviewed and stable Respiratory status: spontaneous breathing, nonlabored ventilation and respiratory function stable Cardiovascular status: blood pressure returned to baseline and stable Postop Assessment: no apparent nausea or vomiting Anesthetic complications: no   No notable events documented.   Last Vitals:  Vitals:   03/16/24 0300 03/16/24 0735  BP: (!) 102/58 (!) 94/44  Pulse: 76 75  Resp:  17  Temp: (!) 36.4 C 36.5 C  SpO2: 94% 94%    Last Pain:  Vitals:   03/16/24 0735  TempSrc: Oral  PainSc:                  Foye Deer

## 2024-03-16 NOTE — Evaluation (Signed)
 Physical Therapy Evaluation Patient Details Name: [name] [name] MRN: [id number] DOB: [birth year] Today's Date: 03/16/2024  History of Present Illness  Pt admitted for SBO and is now s/p exploratory laparotomy POD 1 at time of evaluation. Initial complaints of abdominal pain accompanied with nausea and emesis. No noted fever, chills, CP. He does endorse a recent cough. He has not had a bowel movement in 3-4 days reportedly. Of note, he has a significant PMHx including recent admission in January for critical limb ischemia for which he required femoral endarterectomy, CHF (EF 35-40% in 2024), CAD s/p CABG, and on chronic anticoagulation with Eliquis. 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  Clinical Impression  Pt is a pleasantly confused 89 year old male who was admitted for SBO and is POD 1 s/p exploratory laparotomy. Pt performs bed mobility with cga and transfers with max assist using RW. L foot in ace wrapping. Pt unable to verbalize any history of precautions to [facility] but does appear to guard away from that side in standing. Not safe to attempt formal transfers/ambulation at this time. Pt demonstrates deficits with strength/mobility/balance. Per prior chart, pt was indep with rollator at baseline, pt is poor historian- unsure of accuracy as pt reports he hasn't walked in "a while." Would benefit from skilled PT to address above deficits and promote optimal return to PLOF. Pt will continue to receive skilled PT services while admitted and will defer to TOC/care team for updates regarding disposition planning.     If plan is discharge home, recommend the following: Two people to help with walking and/or transfers;A lot of help with bathing/dressing/bathroom;Help with stairs or ramp for entrance;Supervision due to cognitive status   Can travel by private vehicle   No    Equipment Recommendations  (TBD)  Recommendations  for Other Services       Functional Status Assessment Patient has had a recent decline in their functional status and demonstrates the ability to make significant improvements in function in a reasonable and predictable amount of time.     Precautions / Restrictions Precautions Precautions: Fall Recall of Precautions/Restrictions: Impaired Restrictions Weight Bearing Restrictions Per Provider Order: No      Mobility  Bed Mobility Overal bed mobility: Needs Assistance Bed Mobility: Supine to Sit, Sit to Supine     Supine to sit: Contact guard Sit to supine: Contact guard assist   General bed mobility comments: Once seated at EOB, upright posture noted    Transfers Overall transfer level: Needs assistance Equipment used: Rolling walker (2 wheels) Transfers: Sit to/from Stand Sit to Stand: Max assist           General transfer comment: needs heavy cues for sequencing. 2 trials of standing with heavy post lean and guarding away from L hemibody. Able to stand initially for 10 seconds progressing to 20 seconds on 2nd attempt. Unable to further progress mobility due to poor balance    Ambulation/Gait               General Gait Details: unable  Stairs            Wheelchair Mobility     Tilt Bed    Modified Rankin (Stroke Patients Only)       Balance Overall balance assessment: History of Falls, Needs assistance Sitting-balance support: Feet supported Sitting balance-Leahy Scale: Fair     Standing balance support: Bilateral upper extremity supported Standing balance-Leahy Scale: Poor  Pertinent Vitals/Pain Pain Assessment Pain Assessment: No/denies pain    Home Living Family/patient expects to be discharged to:: Assisted living                 Home Equipment: Standard Walker;Cane - single point;Shower seat Additional Comments: history obtained from chart review as pt is very confused at  baseline and is poor historian    Prior Function               Mobility Comments: uses a rollator. Was getting HHPT at ALF. Able to perform transfers from bed to W/C and toilet. Per pt- reports he hasn't been able to walk in "a while" ADLs Comments: gets help wiping     Extremity/Trunk Assessment   Upper Extremity Assessment Upper Extremity Assessment: Generalized weakness    Lower Extremity Assessment Lower Extremity Assessment: Generalized weakness (B LE grossly 3/5)       Communication   Communication Communication: No apparent difficulties Factors Affecting Communication: Hearing impaired;Reduced clarity of speech    Cognition Arousal: Alert Behavior During Therapy: WFL for tasks assessed/performed   PT - Cognitive impairments: History of cognitive impairments                       PT - Cognition Comments: confused, however pleasant Following commands: Intact       Cueing Cueing Techniques: Verbal cues     General Comments      Exercises     Assessment/Plan    PT Assessment Patient needs continued PT services  PT Problem List Decreased strength;Decreased balance;Decreased activity tolerance;Decreased mobility;Decreased safety awareness;Decreased cognition;Decreased knowledge of use of DME       PT Treatment Interventions DME instruction;Gait training;Therapeutic exercise;Balance training    PT Goals (Current goals can be found in the Care Plan section)  Acute Rehab PT Goals Patient Stated Goal: to walk again PT Goal Formulation: With patient Time For Goal Achievement: 03/30/24 Potential to Achieve Goals: Good    Frequency Min 2X/week     Co-evaluation               AM-PAC PT "6 Clicks" Mobility  Outcome Measure Help needed turning from your back to your side while in a flat bed without using bedrails?: A Little Help needed moving from lying on your back to sitting on the side of a flat bed without using bedrails?: A  Little Help needed moving to and from a bed to a chair (including a wheelchair)?: Total Help needed standing up from a chair using your arms (e.g., wheelchair or bedside chair)?: A Lot Help needed to walk in hospital room?: Total Help needed climbing 3-5 steps with a railing? : Total 6 Click Score: 11    End of Session Equipment Utilized During Treatment: Gait belt Activity Tolerance: Patient tolerated treatment well Patient left: in bed;with bed alarm set Nurse Communication: Mobility status PT Visit Diagnosis: Unsteadiness on feet (R26.81);Muscle weakness (generalized) (M62.81);History of falling (Z91.81);Difficulty in walking, not elsewhere classified (R26.2)    Time: 4696-2952 PT Time Calculation (min) (ACUTE ONLY): 18 min   Charges:   PT Evaluation $PT Eval Low Complexity: 1 Low PT Treatments $Therapeutic Activity: 8-22 mins PT General Charges $$ ACUTE PT VISIT: 1 Visit         Elizabeth Palau, PT, DPT, GCS [phone]   Kassidie Hendriks 03/16/2024, 1:29 PM

## 2024-03-16 NOTE — Evaluation (Signed)
 Occupational Therapy Evaluation Patient Details Name: [name] [name] MRN: [id number] DOB: 01-18-1929 Today's Date: 03/16/2024   History of Present Illness   Pt admitted for SBO and is now s/p exploratory laparotomy POD 1 at time of evaluation. Initial complaints of abdominal pain accompanied with nausea and emesis. No noted fever, chills, CP. He does endorse a recent cough. He has not had a bowel movement in 3-4 days reportedly. Of note, he has a significant PMHx including recent admission in January for critical limb ischemia for which he required femoral endarterectomy, CHF (EF 35-40% in 2024), CAD s/p CABG, and on chronic anticoagulation with Eliquis. 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     Clinical Impressions Pt was seen for OT evaluation this date. Pt unable to provide history d/t cognitive deficits, however per previous chart review pt was a resident of ALF and was a SPT to W/C, BSC, etc. Seems as though he was receiving HH therapy at his facility and had assist for ADLs.  Pt presents to acute OT demonstrating impaired ADL performance and functional mobility 2/2 weakness, balance deficits, and low activity tolerance. Pt currently requires Min progressing to CGA for bed mobility with cueing for log roll technique and good carryover after education. Posterior lean at times while seated EOB, cueing needed to forward scoot. Max A to LB dress and nurse coming in to wrap LLE wound during session. Bed elevated and attempted STS trials x3 with pt requiring Max A for 1 successful STS trial for 5-7 seconds max tolerance before returning to seated EOB, unsafe to progress mobility further. Required bil foot blocking to lateral scoot to [facility] prior to returning to supine.  Pt would benefit from skilled OT services to address noted impairments and functional limitations (see below for any additional details) in order to maximize  safety and independence while minimizing falls risk and caregiver burden. Do anticipate the need for follow up OT services upon acute hospital DC.      If plan is discharge home, recommend the following:   A lot of help with bathing/dressing/bathroom;Help with stairs or ramp for entrance;Two people to help with walking and/or transfers     Functional Status Assessment   Patient has had a recent decline in their functional status and demonstrates the ability to make significant improvements in function in a reasonable and predictable amount of time.     Equipment Recommendations   Other (comment) (defer)     Recommendations for Other Services         Precautions/Restrictions   Precautions Precautions: Fall Recall of Precautions/Restrictions: Impaired Restrictions Weight Bearing Restrictions Per Provider Order: No     Mobility Bed Mobility Overal bed mobility: Needs Assistance Bed Mobility: Rolling, Sit to Sidelying, Sidelying to Sit Rolling: Contact guard assist Sidelying to sit: Min assist, Contact guard assist     Sit to sidelying: Contact guard assist General bed mobility comments: Min A for initial supine to sit progressing to CGA on 2nd trial    Transfers Overall transfer level: Needs assistance Equipment used: Rolling walker (2 wheels) Transfers: Sit to/from Stand             General transfer comment: multiple STS trials at EOB required Max A with heavy post lean, feet blocking required; did not tolerate standing for more than 5-10 seconds at a time, but unsafe to progress mobility without 2nd assist      Balance Overall balance assessment: History  of Falls, Needs assistance Sitting-balance support: Feet supported Sitting balance-Leahy Scale: Fair     Standing balance support: Bilateral upper extremity supported, Reliant on assistive device for balance Standing balance-Leahy Scale: Poor Standing balance comment: Max A at RW to maintain  balance for only short period of time                           ADL either performed or assessed with clinical judgement   ADL Overall ADL's : Needs assistance/impaired                     Lower Body Dressing: Maximal assistance Lower Body Dressing Details (indicate cue type and reason): to don R sock seated EOB               General ADL Comments: anticipate Max/total assist for LB ADLs and Min A for UB ADLs     Vision         Perception         Praxis         Pertinent Vitals/Pain Pain Assessment Pain Assessment: No/denies pain     Extremity/Trunk Assessment Upper Extremity Assessment Upper Extremity Assessment: Generalized weakness   Lower Extremity Assessment Lower Extremity Assessment: Generalized weakness       Communication Communication Communication: No apparent difficulties Factors Affecting Communication: Hearing impaired;Reduced clarity of speech   Cognition Arousal: Alert Behavior During Therapy: WFL for tasks assessed/performed                                 Following commands: Intact, Impaired Following commands impaired: Follows one step commands with increased time     Cueing  General Comments   Cueing Techniques: Verbal cues  nurse wrapped L foot wound during session; otherwise stable; HR 106 with sitting EOB   Exercises     Shoulder Instructions      Home Living Family/patient expects to be discharged to:: Assisted living                             Home Equipment: Standard Walker;Cane - single point;Shower seat   Additional Comments: history obtained from chart review as pt is very confused at baseline and is poor historian      Prior Functioning/Environment               Mobility Comments: uses a rollator. Was getting HHPT at ALF. Able to perform transfers from bed to W/C and toilet. Per pt- reports he hasn't been able to walk in "a while" ADLs Comments: gets help  wiping    OT Problem List: Decreased strength;Decreased activity tolerance;Decreased safety awareness;Impaired balance (sitting and/or standing)   OT Treatment/Interventions: Self-care/ADL training;Therapeutic activities;Therapeutic exercise;Energy conservation;Patient/family education;DME and/or AE instruction;Balance training      OT Goals(Current goals can be found in the care plan section)   Acute Rehab OT Goals Patient Stated Goal: improve strength OT Goal Formulation: Patient unable to participate in goal setting Time For Goal Achievement: 03/30/24 Potential to Achieve Goals: Fair ADL Goals Pt Will Perform Grooming: with supervision;sitting Pt Will Perform Upper Body Bathing: with contact guard assist;with supervision;sitting Pt Will Transfer to Toilet: with contact guard assist;stand pivot transfer;bedside commode   OT Frequency:  Min 2X/week    Co-evaluation  AM-PAC OT "6 Clicks" Daily Activity     Outcome Measure Help from another person eating meals?: None Help from another person taking care of personal grooming?: A Little Help from another person toileting, which includes using toliet, bedpan, or urinal?: A Lot Help from another person bathing (including washing, rinsing, drying)?: A Lot Help from another person to put on and taking off regular upper body clothing?: A Little Help from another person to put on and taking off regular lower body clothing?: A Lot 6 Click Score: 16   End of Session Equipment Utilized During Treatment: Rolling walker (2 wheels) Nurse Communication: Mobility status  Activity Tolerance: Patient tolerated treatment well Patient left: in bed;with call bell/phone within reach;with bed alarm set  OT Visit Diagnosis: Other abnormalities of gait and mobility (R26.89);Unsteadiness on feet (R26.81);Muscle weakness (generalized) (M62.81)                Time: 1610-9604 OT Time Calculation (min): 24 min Charges:  OT General  Charges $OT Visit: 1 Visit OT Evaluation $OT Eval Moderate Complexity: 1 Mod OT Treatments $Therapeutic Activity: 8-22 mins  Shalea Tomczak, OTR/L 03/16/24, 2:59 PM  Jesusa Stenerson E Hensley Treat 03/16/2024, 2:55 PM

## 2024-03-16 NOTE — Progress Notes (Signed)
 PROGRESS NOTE    [name] [name]  YQM:[id number] DOB: 06-07-1929 DOA: 03/14/2024 PCP: [name], MD  Outpatient Specialists: cardiology, oncology    Brief Narrative:   From admission h and p  ndrew JAHMERE [name] is a 89 y.o. male with medical history significant for Severe PAD s/p multiple revascularization most recently 01/22/2024 for critical limb ischemia, and on chronic anticoagulation with Eliquis, systolic CHF secondary to ischemic cardiomyopathy, EF 35 to 40% 08/2023, CAD s/p CABG times 03/16/1998, HTN, who is being admitted with a small bowel obstruction, after presenting with nausea vomiting lower abdominal pain that started around noon on 3/17.  He had no diarrhea, no blood in the stool, no fever or chills.   Assessment & Plan:   Principal Problem:   Small bowel obstruction (HCC) Active Problems:   Lactic acidosis   CAD with history of CABG x3 (coronary artery disease)   PVD (peripheral vascular disease) (HCC)   Chronic anticoagulation   History of revascularization procedure of lower extremity   Chronic heart failure with reduced ejection fraction (HFrEF, <= 40%) (HCC)   Ischemic cardiomyopathy   HTN (hypertension), benign   Acquired hypothyroidism   A-fib (HCC)   Type 2 diabetes mellitus with peripheral angiopathy (HCC)   Recurrent squamous cell carcinoma in situ (SCCIS) of skin of cheek  # SBO POD1 from ex lap with lysis of adhesion. Volvulus with hemorrhagic bowel encountered, no resection performed. - npo - no ng tube didn't tolerate - diet and abx per gen surg, continuing zosyn for now - pain control - continue fluids  # Delirium After surgery yesterday, appears resolved - delirium precautions  # Foley catheter - will d/c and monitor for retention  # SCC left neck - wound care consult  # Ulcer left heel Chronic, followed by wound care as outpt, daughter says slowly improving - wound care consult  # CAD Hx cabg x3, no chest pain - statin -  asa/plavix as below  # Hypothyroid - cont home synthroid  # Bruise right flank Appears superficial, no internal bleeding seen on CT of chest  # HFrEF Appears euvolemic - holding home lasix and metop for now  # A-fib Rate controlled currently, BPs soft - holding home metop - resume anticoagulation when gen surg says safe to  # PAD S/p multiple vascular procedures most recently January of this year - statin and aspirin and apixaban as above  # T2DM - SSI  # Debility [facility] - pt/ot consults  DVT prophylaxis: SCDs Code Status: dnr/dni Family Communication: daughter updated @ bedside  Level of care: Telemetry Medical Status is: Inpatient Remains inpatient appropriate because: severity of illness    Consultants:  Gen surg  Procedures: Ex lap  Antimicrobials:  zosyn    Subjective: No pain, feels well  Objective: Vitals:   03/15/24 1947 03/15/24 2051 03/16/24 0300 03/16/24 0735  BP: (!) 94/53 (!) 94/52 (!) 102/58 (!) 94/44  Pulse: 91 86 76 75  Resp: 20   17  Temp: 98.6 F (37 C) 97.7 F (36.5 C) (!) 97.5 F (36.4 C) 97.7 F (36.5 C)  TempSrc:  Axillary Temporal Oral  SpO2: 93%  94% 94%  Weight:      Height:        Intake/Output Summary (Last 24 hours) at 03/16/2024 1255 Last data filed at 03/16/2024 0300 Gross per 24 hour  Intake 0 ml  Output 500 ml  Net -500 ml   [employer]  03/14/24 2358 03/15/24 1027  Weight: 62 kg 62 kg    Examination:  General exam: Appears calm and comfortable  Respiratory system: Clear to auscultation. Respiratory effort normal. Cardiovascular system: S1 & S2 heard, RRR. Soft sytolic murmur Gastrointestinal system: Abdomen is mildly distended, mild tenderness, incision c/d/i Central nervous system: Alert and oriented to self moving all 4 Extremities: Symmetric 5 x 5 power. Skin:    Psychiatry: mild confusion, calm    Data Reviewed: I have personally reviewed following labs and imaging  studies  CBC: Recent Labs  Lab 03/15/24 0013 03/15/24 0823 03/16/24 0532  WBC 16.3* 18.3* 26.2*  NEUTROABS 13.8*  --   --   HGB 11.0* 10.0* 8.5*  HCT 34.3* 30.8* 25.7*  MCV 94.0 93.1 92.8  PLT 363 335 220   Basic Metabolic Panel: Recent Labs  Lab 03/15/24 0013 03/16/24 0532  NA 133* 138  K 3.6 3.8  CL 98 108  CO2 24 25  GLUCOSE 174* 120*  BUN 32* 38*  CREATININE 1.15 1.24  CALCIUM 9.0 7.3*   GFR: Estimated Creatinine Clearance: 33.3 mL/min (by C-G formula based on SCr of 1.24 mg/dL). Liver Function Tests: Recent Labs  Lab 03/15/24 0013  AST 31  ALT 19  ALKPHOS 51  BILITOT 1.2  PROT 7.6  ALBUMIN 3.5   Recent Labs  Lab 03/15/24 0013  LIPASE 24   No results for input(s): "AMMONIA" in the last 168 hours. Coagulation Profile: No results for input(s): "INR", "PROTIME" in the last 168 hours. Cardiac Enzymes: No results for input(s): "CKTOTAL", "CKMB", "CKMBINDEX", "TROPONINI" in the last 168 hours. BNP (last 3 results) No results for input(s): "PROBNP" in the last 8760 hours. HbA1C: No results for input(s): "HGBA1C" in the last 72 hours. CBG: Recent Labs  Lab 03/15/24 1337  GLUCAP 163*   Lipid Profile: No results for input(s): "CHOL", "HDL", "LDLCALC", "TRIG", "CHOLHDL", "LDLDIRECT" in the last 72 hours. Thyroid Function Tests: No results for input(s): "TSH", "T4TOTAL", "FREET4", "T3FREE", "THYROIDAB" in the last 72 hours. Anemia Panel: No results for input(s): "VITAMINB12", "FOLATE", "FERRITIN", "TIBC", "IRON", "RETICCTPCT" in the last 72 hours. Urine analysis:    Component Value Date/Time   COLORURINE YELLOW (A) 03/15/2024 0013   APPEARANCEUR CLEAR (A) 03/15/2024 0013   APPEARANCEUR Clear 11/06/2013 1304   LABSPEC 1.018 03/15/2024 0013   LABSPEC 1.014 11/06/2013 1304   PHURINE 5.0 03/15/2024 0013   GLUCOSEU NEGATIVE 03/15/2024 0013   GLUCOSEU Negative 11/06/2013 1304   HGBUR NEGATIVE 03/15/2024 0013   BILIRUBINUR NEGATIVE 03/15/2024 0013    BILIRUBINUR Negative 11/06/2013 1304   KETONESUR NEGATIVE 03/15/2024 0013   PROTEINUR NEGATIVE 03/15/2024 0013   NITRITE NEGATIVE 03/15/2024 0013   LEUKOCYTESUR NEGATIVE 03/15/2024 0013   LEUKOCYTESUR Negative 11/06/2013 1304   Sepsis Labs: @LABRCNTIP (procalcitonin:4,lacticidven:4)  ) Recent Results (from the past 240 hours)  Culture, blood (routine x 2)     Status: None (Preliminary result)   Collection Time: 03/15/24  2:09 AM   Specimen: BLOOD  Result Value Ref Range Status   Specimen Description BLOOD LEFT ARM  Final   Special Requests   Final    BOTTLES DRAWN AEROBIC AND ANAEROBIC Blood Culture results may not be optimal due to an inadequate volume of blood received in culture bottles   Culture   Final    NO GROWTH 1 DAY Performed at [facility], [address] [place], [place] [address]    Report Status PENDING  Incomplete  Culture, blood (routine x 2)  Status: None (Preliminary result)   Collection Time: 03/15/24  4:05 AM   Specimen: BLOOD  Result Value Ref Range Status   Specimen Description BLOOD RIGHT AC  Final   Special Requests   Final    BOTTLES DRAWN AEROBIC AND ANAEROBIC Blood Culture results may not be optimal due to an inadequate volume of blood received in culture bottles   Culture   Final    NO GROWTH 1 DAY Performed at [facility], [address], [place], [place] [address]    Report Status PENDING  Incomplete         Radiology Studies: CT CHEST ABDOMEN PELVIS W CONTRAST Result Date: 03/15/2024 CLINICAL DATA:  Abdominal pain, nausea, vomiting, ecchymosis in the right chest, abdomen, and pelvis. On anticoagulant. EXAM: CT CHEST, ABDOMEN, AND PELVIS WITH CONTRAST TECHNIQUE: Multidetector CT imaging of the chest, abdomen and pelvis was performed following the standard protocol during bolus administration of intravenous contrast. RADIATION DOSE REDUCTION: This exam was performed according to the departmental dose-optimization  program which includes automated exposure control, adjustment of the mA and/or kV according to patient size and/or use of iterative reconstruction technique. CONTRAST:  OMNIPAQUE IOHEXOL 300 MG/ML  SOLN COMPARISON:  PET/CT 07/24/2023; CT abdomen pelvis 04/27/2023 FINDINGS: CT CHEST FINDINGS Cardiovascular: Cardiomegaly. No pericardial effusion. Coronary artery and aortic atherosclerotic calcification. Mediastinum/Nodes: No enlarged mediastinal, hilar, or axillary lymph nodes. Thyroid gland, trachea, and esophagus demonstrate no significant findings. Lungs/Pleura: Small right pleural effusion and compressive atelectasis in the right lower lobe. Additional atelectasis in the left lower lobe. No pneumothorax. Centrilobular micro nodules in the right middle lobe compatible with small airway infection/inflammation. Musculoskeletal: Compression fracture of T12 is increased compared to 07/24/2023. There is 40% vertebral body height loss anteriorly. No retropulsion. CT ABDOMEN PELVIS FINDINGS Hepatobiliary: Cholelithiasis. No evidence of acute cholecystitis. No biliary dilation. No acute P[place]ock abnormality. Pancreas: Unremarkable. Spleen: Unremarkable. Adrenals/Urinary Tract: Normal adrenal glands. Atrophic native kidneys. No urinary calculi or hydronephrosis. Unremarkable bladder. Stomach/Bowel: Stomach is within normal limits. Diffuse dilation of the small bowel with transition point in the anterior left abdomen (circa series 5/image 39). There is swirling of vessels in the central mesentery. The dilated bowel demonstrates mild wall thickening. Adjacent mesenteric stranding, interloop free fluid and vascular congestion. Bowel ischemia from internal hernia or closed loop obstruction are not excluded. No pneumatosis. Normal colon without wall thickening. Normal appendix. Vascular/Lymphatic: Aortic atherosclerotic calcification. No lymphadenopathy. Reproductive: No acute abnormality. Other: Small volume abdominopelvic  ascites. No free intraperitoneal air. Musculoskeletal: 9 no acute fracture. IMPRESSION: 1. Small-bowel obstruction with transition point in the anterior left abdomen. There is swirling of vessels in the central mesentery. The dilated bowel demonstrates mild wall thickening. Adjacent mesenteric stranding, interloop free fluid and vascular congestion. Developing small bowel ischemia from internal hernia or closed loop obstruction are not excluded. Surgical consult recommended. 2. Small right pleural effusion and compressive atelectasis in the right lower lobe. 3. Centrilobular micro nodules in the right middle lobe compatible with small airway infection/inflammation. 4. Compression fracture of T12 is increased compared to 07/24/2023. 5. Aortic Atherosclerosis (ICD10-I70.0). Electronically Signed   By: Minerva Fester M.D.   On: 03/15/2024 01:41        Scheduled Meds:  Chlorhexidine Gluconate Cloth  6 each Topical Daily   levothyroxine  112 mcg Oral Q0600   Continuous Infusions:  piperacillin-tazobactam (ZOSYN)  IV 3.375 g (03/16/24 0858)     LOS: 1 day     Silvano Bilis, MD [facility]  If 7PM-7AM, please contact night-coverage [email or web address] Password [facility] 03/16/2024, 12:55 PM

## 2024-03-17 ENCOUNTER — Ambulatory Visit: Payer: Medicare HMO | Admitting: Radiation Oncology

## 2024-03-17 ENCOUNTER — Inpatient Hospital Stay

## 2024-03-17 DIAGNOSIS — E43 Unspecified severe protein-calorie malnutrition: Secondary | ICD-10-CM | POA: Insufficient documentation

## 2024-03-17 DIAGNOSIS — K56609 Unspecified intestinal obstruction, unspecified as to partial versus complete obstruction: Secondary | ICD-10-CM | POA: Diagnosis not present

## 2024-03-17 LAB — BASIC METABOLIC PANEL
Anion gap: 9 (ref 5–15)
BUN: 31 mg/dL — ABNORMAL HIGH (ref 8–23)
CO2: 23 mmol/L (ref 22–32)
Calcium: 7.6 mg/dL — ABNORMAL LOW (ref 8.9–10.3)
Chloride: 109 mmol/L (ref 98–111)
Creatinine, Ser: 0.96 mg/dL (ref 0.61–1.24)
GFR, Estimated: >60 mL/min (ref >60)
Glucose, Bld: 88 mg/dL (ref 70–99)
Potassium: 3.2 mmol/L — ABNORMAL LOW (ref 3.5–5.1)
Sodium: 141 mmol/L (ref 135–145)

## 2024-03-17 LAB — MAGNESIUM: Magnesium: 1.9 mg/dL (ref 1.7–2.4)

## 2024-03-17 LAB — CBC
HCT: 23.2 % — ABNORMAL LOW (ref 39.0–52.0)
Hemoglobin: 7.5 g/dL — ABNORMAL LOW (ref 13.0–17.0)
MCH: 30.6 pg (ref 26.0–34.0)
MCHC: 32.3 g/dL (ref 30.0–36.0)
MCV: 94.7 fL (ref 80.0–100.0)
Platelets: 171 10*3/uL (ref 150–400)
RBC: 2.45 MIL/uL — ABNORMAL LOW (ref 4.22–5.81)
RDW: 22.1 % — ABNORMAL HIGH (ref 11.5–15.5)
WBC: 17.3 10*3/uL — ABNORMAL HIGH (ref 4.0–10.5)
nRBC: 0.2 % (ref 0.0–0.2)

## 2024-03-17 LAB — APTT
aPTT: 34 s (ref 24–36)
aPTT: 67 s — ABNORMAL HIGH (ref 24–36)

## 2024-03-17 LAB — PROTIME-INR
INR: 1.4 — ABNORMAL HIGH (ref 0.8–1.2)
Prothrombin Time: 16.9 s — ABNORMAL HIGH (ref 11.4–15.2)

## 2024-03-17 LAB — PREPARE RBC (CROSSMATCH)

## 2024-03-17 LAB — HEPARIN LEVEL (UNFRACTIONATED)
Heparin Unfractionated: <0.1 [IU]/mL — ABNORMAL LOW (ref 0.30–0.70)
Heparin Unfractionated: 0.31 [IU]/mL (ref 0.30–0.70)

## 2024-03-17 LAB — PHOSPHORUS: Phosphorus: 1.9 mg/dL — ABNORMAL LOW (ref 2.5–4.6)

## 2024-03-17 MED ORDER — SODIUM CHLORIDE 0.9% IV SOLUTION: Freq: Once | INTRAVENOUS | Status: AC

## 2024-03-17 MED ORDER — HEPARIN (PORCINE) 25000 UT/250ML-% IV SOLN
950.0000 [IU]/h | INTRAVENOUS | Status: DC
  Administered 2024-03-17: 850 [IU]/h via INTRAVENOUS
  Filled 2024-03-17 (×3): qty 250

## 2024-03-17 MED ORDER — THIAMINE HCL 100 MG/ML IJ SOLN
100.0000 mg | Freq: Every day | INTRAMUSCULAR | Status: DC
  Administered 2024-03-17 – 2024-03-22 (×6): 100 mg via INTRAVENOUS
  Filled 2024-03-17 (×7): qty 2

## 2024-03-17 MED ORDER — POTASSIUM PHOSPHATES 15 MMOLE/5ML IV SOLN
15.0000 mmol | Freq: Once | INTRAVENOUS | Status: AC
  Administered 2024-03-17: 15 mmol via INTRAVENOUS
  Filled 2024-03-17: qty 5

## 2024-03-17 MED ORDER — KCL IN DEXTROSE-NACL 20-5-0.45 MEQ/L-%-% IV SOLN
INTRAVENOUS | Status: DC
  Filled 2024-03-17 (×3): qty 1000

## 2024-03-17 NOTE — Plan of Care (Signed)

## 2024-03-17 NOTE — Consult Note (Signed)
 PHARMACY - ANTICOAGULATION CONSULT NOTE  Pharmacy Consult for heparin infusion Indication: atrial fibrillation  Allergies  Allergen Reactions   Cephalexin Rash   Spironolactone Nausea Only   Codeine Rash and Other (See Comments)    Can not sleep   Doxycycline Rash    Patient Measurements: Height: 5\' 7"  (170.2 cm) Weight: 63.5 kg (139 lb 15.9 oz) IBW/kg (Calculated) : 66.1 Heparin Dosing Weight: 62 kg  Vital Signs: Temp: 97.5 F (36.4 C) (03/20 0857) Temp Source: Oral (03/20 0857) BP: 106/54 (03/20 0857) Pulse Rate: 84 (03/20 0857)  Labs: Recent Labs    03/15/24 0013 03/15/24 0156 03/15/24 0823 03/16/24 0532 03/17/24 0501 03/17/24 0830  HGB 11.0*  --  10.0* 8.5* 7.5*  --   HCT 34.3*  --  30.8* 25.7* 23.2*  --   PLT 363  --  335 220 171  --   APTT  --   --   --   --   --  34  LABPROT  --   --   --   --   --  16.9*  INR  --   --   --   --   --  1.4*  HEPARINUNFRC  --   --   --   --   --  <0.10*  CREATININE 1.15  --   --  1.24 0.96  --   TROPONINIHS 19* 20*  --   --   --   --     Estimated Creatinine Clearance: 44.1 mL/min (by C-G formula based on SCr of 0.96 mg/dL).   Medical History: Past Medical History:  Diagnosis Date   A-fib [facility])    a.) CHA2DS2VASc = 5 (age x 2, CHF, HTN, previous MI/vascular disease history);  b.) s/p DCCV 03/05/1993; c.) rate/rhythm maintained on oral metoprolol succinate; chronically anticoagulated with dose reduced apixaban   Aortic atherosclerosis (HCC)    B12 deficiency    CHF (congestive heart failure) (HCC)    a.) TTE 11/05/12: EF 25-35, dist sep AK, mild MR; b.) TTE 05/10/14: EF 30, mild LVH, mod BAE, mild AR/PR, mod MR/TR; c.) TTE 03/12/20: EF 20, sev glob HK, mod BAE, mild LAE, mod RAE, triv PR, mild AR, sev MR/TR; d.) TTE 08/21/21: EF 30, sev glob HK, apical dyskinesis, mild LVH, mod BAE, triv AR/PR, mod MR, sev TR; e.) TTE 09/25/23: EF 35-40,  diff AK/HK, G2DD, sev BAE, RVE, RVSP 66.3, mild MR, AoV calc   Cor pulmonale (HCC)     Coronary artery disease 02/21/1993   a.) LHC 02/21/1993 : 95% pLAD, 75% OM1, 50% OM3, 25-50% pRCA, 50% large anterolateral lesion --> transfer to [facility] for CABG; b.) s/p 2v CABG 02/26/1993 (LIMA-LAD, SVG-OM1)   Family history of adverse reaction to anesthesia    a.) PONV in 1st degree relative (daughter)   GERD (gastroesophageal reflux disease)    History of [place] spotted fever    HLD (hyperlipidemia)    HTN (hypertension), benign 04/25/2014   Hypothyroidism    Iron deficiency anemia    On apixaban therapy    Posterolateral myocardial infarction (HCC) 02/21/1993   LHC 02/21/1993 : 95% pLAD, 75% OM1, 50% OM3, 25-50% pRCA, 50% large anterolateral lesion --> transfer to [facility] for CABG; b.) s/p 2v CABG 02/26/1993 (LIMA-LAD, SVG-OM1)   Pulmonary HTN (HCC)    a.) TTE 05/10/2014: RVSP 48.6; b.) TTE 03/12/2020: RVSP 73.3; c.) TTE  09/25/2023: RVSP 66.3   PVD (peripheral vascular disease) (HCC)    S/P CABG x  2 02/26/1993   a.) LIMA-LAD, SVG-OM1 --> complicated by atrial flutter on POD4 --> Tx'd with digoxin + procainamide with no resolution. POD5 diltiazem gtt added with no improvement. Ultimately required DCCV on 03/05/1993.   Squamous cell carcinoma of skin 11/18/2021   right ear and mandible, EDC   Squamous cell carcinoma of skin 03/18/2022   right mandible and ear/refer for Mohs/ Dr. Adriana Simas has referred pt to Dr. Nedra Hai in otolaryngology excised by Dr. Nedra Hai 05/03/22, Radiation with Dr. Rushie Chestnut   Type 2 diabetes mellitus with peripheral angiopathy (HCC) 11/10/2018    Medications:  Patient on Eliquis 2.5 mg po BID PTA  Assessment: 89 yo male presented to the ED with complaints of N/V and lower abdominal pain.  Patient admitted with SBO.  On 3/18 patient taken to OR for exploratory laparotomy and reduction of small bowel volvulus.  Patient has history of Afib on Eliquis which has been held since admission.  Pharmacy consulted to initiate heparin infusion.  Baseline labs: aPTT 34s, INR 1.4, Hgb  7.5, PLT wnl  Goal of Therapy:  Heparin level 0.3-0.7 units/ml aPTT 66-102 seconds Monitor platelets by anticoagulation protocol: Yes   Plan: aPTT, heparin level therapeutic x 1 (appears to be correlating) No boluses per surgery  Monitor hgb closely continue heparin infusion at 850 units/hr Check aPTT/HL in 8 hours to confirm Adjust based on aPTT until correlation with HL Daily CBC while on heparin  Lowella Bandy, PharmD 03/17/2024,2:49 PM

## 2024-03-17 NOTE — NC FL2 (Signed)
 [place] MEDICAID FL2 LEVEL OF CARE FORM     IDENTIFICATION  Patient Name: [name] [name] Birthdate: [birth year] Sex: male Admission Date (Current Location): 03/14/2024  [facility] and [place] Number:  Chiropodist and Address:         Provider Number: [id number]  Attending Physician Name and Address:  Kathrynn Running, MD  Relative Name and Phone Number:       Current Level of Care: Hospital Recommended Level of Care: Skilled Nursing Facility Prior Approval Number:    Date Approved/Denied:   PASRR Number: [id number]  Discharge Plan: SNF    Current Diagnoses: Patient Active Problem List   Diagnosis Date Noted   Protein-calorie malnutrition, severe 03/17/2024   Small bowel obstruction (HCC) 03/15/2024   PVD (peripheral vascular disease) (HCC) 03/15/2024   Ischemic cardiomyopathy 03/15/2024   Chronic anticoagulation 03/15/2024   Lactic acidosis 03/15/2024   History of revascularization procedure of lower extremity 03/15/2024   Recurrent squamous cell carcinoma in situ (SCCIS) of skin of cheek 02/09/2024   Symptomatic anemia 02/09/2024   Critical limb ischemia of left lower extremity (HCC) 01/21/2024   Pressure injury of skin of dorsum of left foot 09/27/2023   Fall 09/26/2023   HFrEF (heart failure with reduced ejection fraction) (HCC) 09/26/2023   Syncope 09/24/2023   Chronic heart failure with reduced ejection fraction (HFrEF, <= 40%) (HCC) 09/04/2021   Atherosclerosis of native arteries of the extremities with ulceration (HCC) 09/04/2021   Atherosclerosis of artery of extremity with rest pain (HCC) 08/30/2021   Atherosclerosis of native arteries of extremity with intermittent claudication (HCC) 02/18/2021   Cataract cortical, senile 02/11/2021   Cor pulmonale, acute (HCC) 05/02/2020   Type 2 diabetes mellitus with peripheral angiopathy (HCC) 11/10/2018   Medicare annual wellness visit, initial 05/05/2017   Varicose veins of lower extremities with  ulcer (HCC) 04/23/2017   Chronic venous insufficiency 04/23/2017   Leg pain 04/23/2017   Lymphedema 04/23/2017   A-fib (HCC) 04/23/2017   Hyperlipidemia 04/23/2017   Acquired hypothyroidism 11/03/2016   B12 deficiency 10/27/2014   CAD with history of CABG x3 (coronary artery disease) 04/25/2014   GERD (gastroesophageal reflux disease) 04/25/2014   HTN (hypertension), benign 04/25/2014   S/P CABG x 3 02/26/1993    Orientation RESPIRATION BLADDER Height & Weight     Self  O2 (1L) Continent Weight: 63.5 kg Height:  5\' 7"  (170.2 cm)  BEHAVIORAL SYMPTOMS/MOOD NEUROLOGICAL BOWEL NUTRITION STATUS      Continent Diet (Currently NPO, will advance prior to discharge)  AMBULATORY STATUS COMMUNICATION OF NEEDS Skin   Extensive Assist Verbally Surgical wounds, Other (Comment) (radiation wound to neck, unstageable heel)                       Personal Care Assistance Level of Assistance              Functional Limitations Info             SPECIAL CARE FACTORS FREQUENCY  PT (By licensed PT), OT (By licensed OT)                    Contractures Contractures Info: Not present    Additional Factors Info  Code Status, Allergies Code Status Info: DNR Allergies Info: Cephalexin, Spironolactone, Codeine, Doxycycline           Current Medications (03/17/2024):  This is the current hospital active medication list Current Facility-Administered Medications  Medication Dose Route Frequency Provider  Last Rate Last Admin   Chlorhexidine Gluconate Cloth 2 % PADS 6 each  6 each Topical Daily Kathrynn Running, MD   6 each at 03/17/24 0921   dextrose 5 % and 0.45 % NaCl infusion   Intravenous Continuous Kathrynn Running, MD 100 mL/hr at 03/16/24 1424 New Bag at 03/16/24 1424   heparin ADULT infusion 100 units/mL (25000 units/262mL)  850 Units/hr Intravenous Continuous Barrie Folk, RPH 8.5 mL/hr at 03/17/24 9147 850 Units/hr at 03/17/24 0936   leptospermum manuka honey  (MEDIHONEY) paste 1 Application  1 Application Topical Daily Kathrynn Running, MD   1 Application at 03/17/24 0919   levothyroxine (SYNTHROID) tablet 112 mcg  112 mcg Oral Q0600 Kandis Cocking, MD   112 mcg at 03/17/24 8295   morphine (PF) 2 MG/ML injection 2 mg  2 mg Intravenous Q2H PRN Kandis Cocking, MD   2 mg at 03/17/24 0517   ondansetron [facility]) injection 4 mg  4 mg Intravenous Q4H PRN Kandis Cocking, MD       piperacillin-tazobactam (ZOSYN) IVPB 3.375 g  3.375 g Intravenous Q8H Kandis Cocking, MD 12.5 mL/hr at 03/17/24 0920 3.375 g at 03/17/24 0920   simvastatin (ZOCOR) tablet 40 mg  40 mg Oral QPM Kathrynn Running, MD   40 mg at 03/16/24 1718     Discharge Medications: Please see discharge summary for a list of discharge medications.  Relevant Imaging Results:  Relevant Lab Results:   Additional Information SS# [id number]  Chapman Fitch, RN

## 2024-03-17 NOTE — Progress Notes (Signed)
 [facility] SURGICAL PROGRESS NOTE  Hospital Day(s): 2.   Post op day(s): 2 Days Post-Op.   Interval History:  Patient seen and examined No issues overnight  Patient sitting up in bed; oriented to person and situation No significant abdominal pain reportedly, although he is not most reliable historian  No fever, emesis  Leukocytosis improving; WBC 17.3K (from 26.2K) Hgb to 7.5 Renal function normal; sCr - 1.24: UO - 550 ccs Hypokalemia to 3.2 Hypophosphatemia to 1.9 He is NPO He continues on Zosyn   Vital signs in last 24 hours: [min-max] current  Temp:  [97.5 F (36.4 C)-98.3 F (36.8 C)] 97.7 F (36.5 C) (03/20 0312) Pulse Rate:  [75-90] 90 (03/20 0312) Resp:  [16-17] 16 (03/20 0312) BP: (94-113)/(44-61) 113/61 (03/20 0312) SpO2:  [89 %-94 %] 89 % (03/20 0312) Weight:  [63.5 kg-66.4 kg] 63.5 kg (03/20 0312)     Height: 5\' 7"  (170.2 cm) Weight: 63.5 kg BMI (Calculated): 21.92   Intake/Output last 2 shifts:  03/19 0701 - 03/20 0700 In: 664.5 [I.V.:412; IV Piggyback:252.5] Out: 550 [Urine:550]   Physical Exam:  Constitutional: alert, cooperative and no distress; confused Respiratory: breathing non-labored at rest  Cardiovascular: regular rate and sinus rhythm  Gastrointestinal: soft, no significant tenderness, mild distension, no rebound/guarding Integumentary: Laparotomy is intact with staples; sanguinous drainage inferiorly  Labs:     Latest Ref Rng & Units 03/17/2024    5:01 AM 03/16/2024    5:32 AM 03/15/2024    8:23 AM  CBC  WBC 4.0 - 10.5 K/uL 17.3  26.2  18.3   Hemoglobin 13.0 - 17.0 g/dL 7.5  8.5  16.1   Hematocrit 39.0 - 52.0 % 23.2  25.7  30.8   Platelets 150 - 400 K/uL 171  220  335       Latest Ref Rng & Units 03/17/2024    5:01 AM 03/16/2024    5:32 AM 03/15/2024   12:13 AM  CMP  Glucose 70 - 99 mg/dL 88  096  045   BUN 8 - 23 mg/dL 31  38  32   Creatinine 0.61 - 1.24 mg/dL 4.09  8.11  9.14   Sodium 135 - 145 mmol/L 141  138  133    Potassium 3.5 - 5.1 mmol/L 3.2  3.8  3.6   Chloride 98 - 111 mmol/L 109  108  98   CO2 22 - 32 mmol/L 23  25  24    Calcium 8.9 - 10.3 mg/dL 7.6  7.3  9.0   Total Protein 6.5 - 8.1 g/dL   7.6   Total Bilirubin 0.0 - 1.2 mg/dL   1.2   Alkaline Phos 38 - 126 U/L   51   AST 15 - 41 U/L   31   ALT 0 - 44 U/L   19      Imaging studies: No new pertinent imaging studies   Assessment/Plan:  89 y.o. male 2 Days Post-Op s/p exploratory laparotomy and reduction of small bowel volvulus with hemorrhagic/edematous but viable bowel, complicated by pertinent comorbidities including advanced age, deconditioning, significant cardiac history on Eliquis, and SCC currently on immunotherapy.    - Will leave NPO for now; will get KUB this morning for more objective reassessment. If this looks great, we can consider CLD cautiously. Otherwise, needs to remain NPO.   - Monitor abdominal examination; on-going bowel function  - Pain control prn; antiemetics prn  - Monitor leukocytosis; improving - Monitor H&H; slow down trend,  no overt evidence to suggest bleeding, transfuse <7  - Okay to do heparin drip; no bolus - discussed with pharmacy - Okay to work with therapies; on-board   - Further management per primary service; we will follow    All of the above findings and recommendations were discussed with the patient, and the medical team, and all of patient's questions were answered to his expressed satisfaction.  -- Lynden Oxford, PA-C [facility] 03/17/2024, 7:20 AM M-F: 7am - 4pm

## 2024-03-17 NOTE — TOC Initial Note (Signed)
 Transition of Care [facility]) - Initial/Assessment Note    Patient Details  Name: [name] [name] MRN: [id number] Date of Birth: March 29, 1929  Transition of Care [facility]) CM/SW Contact:    Chapman Fitch, RN Phone Number: 03/17/2024, 9:48 AM  Clinical Narrative:                 Admitted for: 2 Days Post-Op s/p exploratory laparotomy and reduction of small bowel volvulus  Admitted from: [facility] PCP: Hyacinth Meeker Current home health/prior home health/DME: WC, RW.  Home health through [facility]  Therapy recommending SNF Patient A&O x1 Per daughter [name] her goal is for him to return to [facility] Message left for Aleah at [facility] to discuss In the even that [facility] is unable meet his needs SNF bed search started          Patient Goals and CMS Choice            Expected Discharge Plan and Services                                              Prior Living Arrangements/Services                       Activities of Daily Living   ADL Screening (condition at time of admission) Independently performs ADLs?: No Does the patient have a NEW difficulty with bathing/dressing/toileting/self-feeding that is expected to last >3 days?: No Does the patient have a NEW difficulty with getting in/out of bed, walking, or climbing stairs that is expected to last >3 days?: No Does the patient have a NEW difficulty with communication that is expected to last >3 days?: No Is the patient deaf or have difficulty hearing?: No Does the patient have difficulty seeing, even when wearing glasses/contacts?: No Does the patient have difficulty concentrating, remembering, or making decisions?: Yes  Permission Sought/Granted                  Emotional Assessment              Admission diagnosis:  Small bowel obstruction (HCC) [K56.609] SBO (small bowel obstruction) (HCC) [K56.609] Lower abdominal pain [R10.30] Small bowel ischemia (HCC) [K55.9] Nausea and vomiting,  unspecified vomiting type [R11.2] Patient Active Problem List   Diagnosis Date Noted   Protein-calorie malnutrition, severe 03/17/2024   Small bowel obstruction (HCC) 03/15/2024   PVD (peripheral vascular disease) (HCC) 03/15/2024   Ischemic cardiomyopathy 03/15/2024   Chronic anticoagulation 03/15/2024   Lactic acidosis 03/15/2024   History of revascularization procedure of lower extremity 03/15/2024   Recurrent squamous cell carcinoma in situ (SCCIS) of skin of cheek 02/09/2024   Symptomatic anemia 02/09/2024   Critical limb ischemia of left lower extremity (HCC) 01/21/2024   Pressure injury of skin of dorsum of left foot 09/27/2023   Fall 09/26/2023   HFrEF (heart failure with reduced ejection fraction) (HCC) 09/26/2023   Syncope 09/24/2023   Chronic heart failure with reduced ejection fraction (HFrEF, <= 40%) (HCC) 09/04/2021   Atherosclerosis of native arteries of the extremities with ulceration (HCC) 09/04/2021   Atherosclerosis of artery of extremity with rest pain (HCC) 08/30/2021   Atherosclerosis of native arteries of extremity with intermittent claudication (HCC) 02/18/2021   Cataract cortical, senile 02/11/2021   Cor pulmonale, acute (HCC) 05/02/2020   Type 2 diabetes mellitus with peripheral  angiopathy (HCC) 11/10/2018   Medicare annual wellness visit, initial 05/05/2017   Varicose veins of lower extremities with ulcer (HCC) 04/23/2017   Chronic venous insufficiency 04/23/2017   Leg pain 04/23/2017   Lymphedema 04/23/2017   A-fib (HCC) 04/23/2017   Hyperlipidemia 04/23/2017   Acquired hypothyroidism 11/03/2016   B12 deficiency 10/27/2014   CAD with history of CABG x3 (coronary artery disease) 04/25/2014   GERD (gastroesophageal reflux disease) 04/25/2014   HTN (hypertension), benign 04/25/2014   S/P CABG x 3 02/26/1993   PCP:  [name], MD Pharmacy:   [facility] - [place], [place] - [phone] [address] [place] [address] Phone: [phone] Fax: [phone]     Social Drivers of Health (SDOH) Social History: SDOH Screenings   Food Insecurity: No Food Insecurity (03/15/2024)  Housing: Low Risk  (03/15/2024)  Transportation Needs: No Transportation Needs (03/15/2024)  Utilities: Not At Risk (03/15/2024)  Depression (PHQ2-9): Low Risk  (02/09/2024)  Financial Resource Strain: Low Risk  (09/22/2023)   Received from [facility]  Social Connections: Moderately Isolated (03/15/2024)  Tobacco Use: Low Risk  (03/15/2024)   SDOH Interventions:     Readmission Risk Interventions     No data to display

## 2024-03-17 NOTE — Progress Notes (Signed)
 PROGRESS NOTE    [name] [name]  NUU:[id number] DOB: 08-31-1929 DOA: 03/14/2024 PCP: [name], MD  Outpatient Specialists: cardiology, oncology    Brief Narrative:   From admission h and p  ndrew JANIE [name] is a 89 y.o. male with medical history significant for Severe PAD s/p multiple revascularization most recently 01/22/2024 for critical limb ischemia, and on chronic anticoagulation with Eliquis, systolic CHF secondary to ischemic cardiomyopathy, EF 35 to 40% 08/2023, CAD s/p CABG times 03/16/1998, HTN, who is being admitted with a small bowel obstruction, after presenting with nausea vomiting lower abdominal pain that started around noon on 3/17.  He had no diarrhea, no blood in the stool, no fever or chills.   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 Volvulus with hemorrhagic bowel encountered, no resection performed. - npo - no ng tube didn't tolerate - diet and abx per gen surg, continuing zosyn for now - pain control - continue fluids - will likely need TPN if unable to start feeds in the next day or two  # Delirium After surgery yesterday, appears resolved - delirium precautions  # Acute blood loss anemia Hgb has drifted to 7.5. given cad will transfuse to maintain above 8 so ordering one unit today (patient gives consent). Per  gen surg relatively low concern for bleeding but will need to be monitored closely, particularly given resumption of heparin today - 1 u prbc  # SCC left neck - wound care RN recs placed 3/19  # Ulcer left heel Chronic, followed by wound care as outpt, daughter says slowly improving - wound care RN recs placed 3/19  # CAD Hx cabg x3, no chest pain - statin - asa/plavix as below  # Hypothyroid - cont home synthroid  # Bruise right flank Appears superficial, no internal bleeding seen on CT of chest  # HFrEF Appears euvolemic - holding home lasix and metop for now  # A-fib Rate controlled currently, BPs soft - holding home metop given soft BPs - starting heparin today  # PAD S/p multiple vascular procedures most recently January of this year - statin and aspirin and apixaban as above  # T2DM - SSI  # Debility Resides at [facility]. PT advises snf - TOC consulted  DVT prophylaxis: heparin IV Code Status: dnr/dni Family Communication: daughter wanda updated at bedside 3/20  Level of care: Telemetry Medical Status is: Inpatient Remains inpatient appropriate because: severity of illness    Consultants:  Gen surg  Procedures: Ex lap  Antimicrobials:  zosyn    Subjective: No pain, feels well, no vomiting  Objective: Vitals:   03/16/24 1653 03/16/24 2020 03/17/24 0312 03/17/24 0857  BP: (!) 97/52 (!) 94/55 113/61 (!) 106/54  Pulse: 85 79 90 84  Resp: 16 16 16 18   Temp: 98.3 F (36.8 C) (!) 97.5 F (  36.4 C) 97.7 F (36.5 C) (!) 97.5 F (36.4 C)  TempSrc: Oral   Oral  SpO2: 93% 93% (!) 89% 90%  Weight:   63.5 kg   Height:        Intake/Output Summary (Last 24 hours) at 03/17/2024 1201 Last data filed at 03/17/2024 0600 Gross per 24 hour  Intake 664.49 ml  Output 550 ml  Net 114.49 ml   Filed Weights   03/15/24 1027 03/16/24 1631 03/17/24 0312  Weight: 62 kg 66.4 kg 63.5 kg    Examination:  General exam: Appears calm and comfortable   Respiratory system: Clear to auscultation. Respiratory effort normal. Cardiovascular system: S1 & S2 heard, RRR. Soft sytolic murmur Gastrointestinal system: Abdomen is mildly distended, mild tenderness, incision c/d/i Central nervous system: Alert and oriented to self moving all 4 Extremities: Symmetric 5 x 5 power. Skin:    Psychiatry: mild confusion, calm    Data Reviewed: I have personally reviewed following labs and imaging studies  CBC: Recent Labs  Lab 03/15/24 0013 03/15/24 0823 03/16/24 0532 03/17/24 0501  WBC 16.3* 18.3* 26.2* 17.3*  NEUTROABS 13.8*  --   --   --   HGB 11.0* 10.0* 8.5* 7.5*  HCT 34.3* 30.8* 25.7* 23.2*  MCV 94.0 93.1 92.8 94.7  PLT 363 335 220 171   Basic Metabolic Panel: Recent Labs  Lab 03/15/24 0013 03/16/24 0532 03/17/24 0501  NA 133* 138 141  K 3.6 3.8 3.2*  CL 98 108 109  CO2 24 25 23   GLUCOSE 174* 120* 88  BUN 32* 38* 31*  CREATININE 1.15 1.24 0.96  CALCIUM 9.0 7.3* 7.6*  MG  --   --  1.9  PHOS  --   --  1.9*   GFR: Estimated Creatinine Clearance: 44.1 mL/min (by C-G formula based on SCr of 0.96 mg/dL). Liver Function Tests: Recent Labs  Lab 03/15/24 0013  AST 31  ALT 19  ALKPHOS 51  BILITOT 1.2  PROT 7.6  ALBUMIN 3.5   Recent Labs  Lab 03/15/24 0013  LIPASE 24   No results for input(s): "AMMONIA" in the last 168 hours. Coagulation Profile: No results for input(s): "INR", "PROTIME" in the last 168 hours. Cardiac Enzymes: No results for input(s): "CKTOTAL", "CKMB", "CKMBINDEX", "TROPONINI" in the last 168 hours. BNP (last 3 results) No results for input(s): "PROBNP" in the last 8760 hours. HbA1C: No results for input(s): "HGBA1C" in the last 72 hours. CBG: Recent Labs  Lab 03/15/24 1337  GLUCAP 163*   Lipid Profile: No results for input(s): "CHOL", "HDL", "LDLCALC", "TRIG", "CHOLHDL", "LDLDIRECT" in the last 72 hours. Thyroid Function Tests: No results for input(s): "TSH", "T4TOTAL", "FREET4",  "T3FREE", "THYROIDAB" in the last 72 hours. Anemia Panel: No results for input(s): "VITAMINB12", "FOLATE", "FERRITIN", "TIBC", "IRON", "RETICCTPCT" in the last 72 hours. Urine analysis:    Component Value Date/Time   COLORURINE YELLOW (A) 03/15/2024 0013   APPEARANCEUR CLEAR (A) 03/15/2024 0013   APPEARANCEUR Clear 11/06/2013 1304   LABSPEC 1.018 03/15/2024 0013   LABSPEC 1.014 11/06/2013 1304   PHURINE 5.0 03/15/2024 0013   GLUCOSEU NEGATIVE 03/15/2024 0013   GLUCOSEU Negative 11/06/2013 1304   HGBUR NEGATIVE 03/15/2024 0013   BILIRUBINUR NEGATIVE 03/15/2024 0013   BILIRUBINUR Negative 11/06/2013 1304   KETONESUR NEGATIVE 03/15/2024 0013   PROTEINUR NEGATIVE 03/15/2024 0013   NITRITE NEGATIVE 03/15/2024 0013   LEUKOCYTESUR NEGATIVE 03/15/2024 0013   LEUKOCYTESUR Negative 11/06/2013 1304   Sepsis Labs: @LABRCNTIP (procalcitonin:4,lacticidven:4)  ) Recent Results (from the  past 240 hours)  Culture, blood (routine x 2)     Status: None (Preliminary result)   Collection Time: 03/15/24  2:09 AM   Specimen: BLOOD  Result Value Ref Range Status   Specimen Description BLOOD LEFT ARM  Final   Special Requests   Final    BOTTLES DRAWN AEROBIC AND ANAEROBIC Blood Culture results may not be optimal due to an inadequate volume of blood received in culture bottles   Culture   Final    NO GROWTH 2 DAYS Performed at [facility], [address] [place], [place] [address]    Report Status PENDING  Incomplete  Culture, blood (routine x 2)     Status: None (Preliminary result)   Collection Time: 03/15/24  4:05 AM   Specimen: BLOOD  Result Value Ref Range Status   Specimen Description BLOOD RIGHT AC  Final   Special Requests   Final    BOTTLES DRAWN AEROBIC AND ANAEROBIC Blood Culture results may not be optimal due to an inadequate volume of blood received in culture bottles   Culture   Final    NO GROWTH 2 DAYS Performed at [facility], [address],  [place], [place] [address]    Report Status PENDING  Incomplete         Radiology Studies: No results found.       Scheduled Meds:  Chlorhexidine Gluconate Cloth  6 each Topical Daily   leptospermum manuka honey  1 Application Topical Daily   levothyroxine  112 mcg Oral Q0600   simvastatin  40 mg Oral QPM   Continuous Infusions:  dextrose 5 % and 0.45 % NaCl 100 mL/hr at 03/16/24 1424   heparin 850 Units/hr (03/17/24 0936)   piperacillin-tazobactam (ZOSYN)  IV 3.375 g (03/17/24 0920)     LOS: 2 days     Silvano Bilis, MD [facility]   If 7PM-7AM, please contact night-coverage [email or web address] Password [email or web address] 03/17/2024, 12:01 PM

## 2024-03-17 NOTE — Progress Notes (Signed)
 Nutrition Follow Up Note   DOCUMENTATION CODES:   Severe malnutrition in context of chronic illness  INTERVENTION:   Recommend TPN initiation if prolonged NPO is expected  Recommend thiamine 100mg  IV daily   Pt at high refeed risk; recommend monitor potassium, magnesium and phosphorus labs daily until stable  Daily weights   NUTRITION DIAGNOSIS:   Severe Malnutrition related to chronic illness as evidenced by severe fat depletion, severe muscle depletion. -ongoing   GOAL:   Patient will meet greater than or equal to 90% of their needs -not met   MONITOR:   Diet advancement, Labs, Weight trends, I & O's, Skin   ASSESSMENT:   89 y.o. male with medical history significant for severe PAD s/p multiple revascularizations most recently 01/22/2024 for critical limb ischemia and on chronic anticoagulation with Eliquis, PAF, systolic CHF secondary to ischemic cardiomyopathy, GERD, CAD s/p CABG times in 1999, HTN and SCC of the cheek currently on immunotherapy who is admitted with SBO now s/p exploratory laparotomy and reduction of small bowel volvulus 3/18.  Pt remains NPO. Abdomen distended. KUB reporting possible ileus. No BM yet. Would recommend TPN initiation if prolonged NPO is expected as pt with severe malnutrition. Pt is actively refeeding secondary to IV dextrose; will add thiamine. Recommend for phosphorus to be above 2.0 before starting any nutrition support. Per chart, pt is up ~3lbs since admission; will monitor daily weights as pt with CHF history.   Medications reviewed and include: synthroid, NaCl w/ 5% dextrose @100ml /hr, zosyn, heparin   Labs reviewed: K 3.2(L), BUN 31(H), P 1.9(L), Mg 1.9 wnl Wbc- 17.3(H), Hgb 7.5(L), Hct 23.2(L)  Diet Order:   Diet Order             Diet NPO time specified Except for: Sips with Meds  Diet effective now                  EDUCATION NEEDS:   No education needs have been identified at this time  Skin:  Skin Assessment:  Reviewed RN Assessment (Neck; full thickness; cancerous, Left heel; Unstageable Pressure Injury, incision abdomen)  Last BM:  3/18  Height:   Ht Readings from Last 1 Encounters:  03/15/24 5\' 7"  (1.702 m)    Weight:   Wt Readings from Last 1 Encounters:  03/17/24 63.5 kg    Ideal Body Weight:  67.2 kg  BMI:  Body mass index is 21.93 kg/m.  Estimated Nutritional Needs:   Kcal:  1600-1800kcal/day  Protein:  80-90g/day  Fluid:  1.6-1.8L/day  Betsey Holiday MS, RD, LDN If unable to be reached, please send secure chat to "RD inpatient" available from 8:00a-4:00p daily

## 2024-03-17 NOTE — Consult Note (Signed)
 PHARMACY CONSULT NOTE - ELECTROLYTES  Pharmacy Consult for Electrolyte Monitoring and Replacement   Recent Labs: Height: 5\' 7"  (170.2 cm) Weight: 63.5 kg (139 lb 15.9 oz) IBW/kg (Calculated) : 66.1 Estimated Creatinine Clearance: 44.1 mL/min (by C-G formula based on SCr of 0.96 mg/dL).  Potassium (mmol/L)  Date Value  03/17/2024 3.2 (L)  02/04/2015 4.2   Magnesium (mg/dL)  Date Value  30/86/5784 1.9   Calcium (mg/dL)  Date Value  69/62/9528 7.6 (L)   Calcium, Total (mg/dL)  Date Value  41/32/4401 8.5   Albumin (g/dL)  Date Value  02/72/5366 3.5  02/04/2015 3.5   Phosphorus (mg/dL)  Date Value  44/02/4741 1.9 (L)   Sodium (mmol/L)  Date Value  03/17/2024 141  02/04/2015 141   Corrected calcium: 8.00 mg/dL   Assessment  [name] [name] is a 89 y.o. male presenting with a small bowel obstruction. PMH significant for severe PAD, systolic CHF secondary to ischemic cardiomyopathy, EF 35 to 40% 08/2023, CAD s/p CABG times 03/16/1998, and HTN. Pharmacy has been consulted to monitor and replace electrolytes.  Diet: NPO MIVF: D5/0.45 NS plus KCl 20 @ 100 mL/hr  Goal of Therapy: Electrolytes WNL  Plan:  Phos 1.9 and K+ 3.2 Will order potassium phosphate 15 mmol x 1 dose (22 mEq K+)  Also receiving D5/0.45 NS plus KCl 20 mEq @ 100 mL/hr Check BMP, Mg, Phos with AM labs  Thank you for allowing pharmacy to be a part of this patient's care.  Littie Deeds, PharmD Pharmacy Resident  03/17/2024 5:47 PM

## 2024-03-17 NOTE — Consult Note (Addendum)
 PHARMACY - ANTICOAGULATION CONSULT NOTE  Pharmacy Consult for heparin infusion Indication: atrial fibrillation  Allergies  Allergen Reactions   Cephalexin Rash   Spironolactone Nausea Only   Codeine Rash and Other (See Comments)    Can not sleep   Doxycycline Rash    Patient Measurements: Height: 5\' 7"  (170.2 cm) Weight: 63.5 kg (139 lb 15.9 oz) IBW/kg (Calculated) : 66.1 Heparin Dosing Weight: 62 kg  Vital Signs: Temp: 97.7 F (36.5 C) (03/20 0312) BP: 113/61 (03/20 0312) Pulse Rate: 90 (03/20 0312)  Labs: Recent Labs    03/15/24 0013 03/15/24 0156 03/15/24 0823 03/16/24 0532 03/17/24 0501  HGB 11.0*  --  10.0* 8.5* 7.5*  HCT 34.3*  --  30.8* 25.7* 23.2*  PLT 363  --  335 220 171  CREATININE 1.15  --   --  1.24 0.96  TROPONINIHS 19* 20*  --   --   --     Estimated Creatinine Clearance: 44.1 mL/min (by C-G formula based on SCr of 0.96 mg/dL).   Medical History: Past Medical History:  Diagnosis Date   A-fib [facility])    a.) CHA2DS2VASc = 5 (age x 2, CHF, HTN, previous MI/vascular disease history);  b.) s/p DCCV 03/05/1993; c.) rate/rhythm maintained on oral metoprolol succinate; chronically anticoagulated with dose reduced apixaban   Aortic atherosclerosis (HCC)    B12 deficiency    CHF (congestive heart failure) (HCC)    a.) TTE 11/05/12: EF 25-35, dist sep AK, mild MR; b.) TTE 05/10/14: EF 30, mild LVH, mod BAE, mild AR/PR, mod MR/TR; c.) TTE 03/12/20: EF 20, sev glob HK, mod BAE, mild LAE, mod RAE, triv PR, mild AR, sev MR/TR; d.) TTE 08/21/21: EF 30, sev glob HK, apical dyskinesis, mild LVH, mod BAE, triv AR/PR, mod MR, sev TR; e.) TTE 09/25/23: EF 35-40,  diff AK/HK, G2DD, sev BAE, RVE, RVSP 66.3, mild MR, AoV calc   Cor pulmonale (HCC)    Coronary artery disease 02/21/1993   a.) LHC 02/21/1993 : 95% pLAD, 75% OM1, 50% OM3, 25-50% pRCA, 50% large anterolateral lesion --> transfer to [facility] for [facility]G; b.) s/p 2v CABG 02/26/1993 (LIMA-LAD, SVG-OM1)   Family history of  adverse reaction to anesthesia    a.) PONV in 1st degree relative (daughter)   GERD (gastroesophageal reflux disease)    History of [place] spotted fever    HLD (hyperlipidemia)    HTN (hypertension), benign 04/25/2014   Hypothyroidism    Iron deficiency anemia    On apixaban therapy    Posterolateral myocardial infarction (HCC) 02/21/1993   LHC 02/21/1993 : 95% pLAD, 75% OM1, 50% OM3, 25-50% pRCA, 50% large anterolateral lesion --> transfer to [facility] for CABG; b.) s/p 2v CABG 02/26/1993 (LIMA-LAD, SVG-OM1)   Pulmonary HTN (HCC)    a.) TTE 05/10/2014: RVSP 48.6; b.) TTE 03/12/2020: RVSP 73.3; c.) TTE  09/25/2023: RVSP 66.3   PVD (peripheral vascular disease) (HCC)    S/P CABG x 2 02/26/1993   a.) LIMA-LAD, SVG-OM1 --> complicated by atrial flutter on POD4 --> Tx'd with digoxin + procainamide with no resolution. POD5 diltiazem gtt added with no improvement. Ultimately required DCCV on 03/05/1993.   Squamous cell carcinoma of skin 11/18/2021   right ear and mandible, EDC   Squamous cell carcinoma of skin 03/18/2022   right mandible and ear/refer for Mohs/ Dr. Adriana Simas has referred pt to Dr. Nedra Hai in otolaryngology excised by Dr. Nedra Hai 05/03/22, Radiation with Dr. Rushie Chestnut   Type 2 diabetes mellitus with peripheral angiopathy (  HCC) 11/10/2018    Medications:  Patient on Eliquis 2.5 mg po BID PTA  Assessment: 89 yo male presented to the ED with complaints of N/V and lower abdominal pain.  Patient admitted with SBO.  On 3/18 patient taken to OR for exploratory laparotomy and reduction of small bowel volvulus.  Patient has history of Afib on Eliquis which has been held since admission.  Pharmacy consulted to initiate heparin infusion.  Baseline labs ordered.  Goal of Therapy:  Heparin level 0.3-0.7 units/ml aPTT 66-102 seconds Monitor platelets by anticoagulation protocol: Yes   Plan:  No boluses per surgery  Monitor hgb closely Start heparin infusion at 850 units/hr Check aPTT/HL in 8  hours from start of infusion Adjust based on aPTT until correlation with HL Daily CBC while on heparin  Barrie Folk, PharmD 03/17/2024,8:15 AM

## 2024-03-17 NOTE — Progress Notes (Signed)
 Occupational Therapy Treatment Patient Details Name: [name] [name] MRN: [id number] DOB: Sep 14, 1929 Today's Date: 03/17/2024   History of present illness Pt admitted for SBO and is now s/p exploratory laparotomy POD 1 at time of evaluation. Initial complaints of abdominal pain accompanied with nausea and emesis. No noted fever, chills, CP. He does endorse a recent cough. He has not had a bowel movement in 3-4 days reportedly. Of note, he has a significant PMHx including recent admission in January for critical limb ischemia for which he required femoral endarterectomy, CHF (EF 35-40% in 2024), CAD s/p CABG, and on chronic anticoagulation with Eliquis. 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   OT comments  Pt is supine in bed on arrival. Pleasant, easily arousable and agreeable to PT/OT co-tx session to progress his mobility. He grimaces/groans in pain when touching near her L heel from wound. Min A x2 needed for bed mobility today to exit R side d/t inability to grasp bed rails from being too close to R side initially. Pt required Mod A x2 for STS from elevated bed surface with bil feet blocking and cueing for hand feet/placement with pt pulling on RW to stand needing walker to be held down to prevent tipping. x1 knee buckling incident prior to SPT to recliner. Mod A x2 for transfer using RW with management of RW during turn, constant multi-modal cues for proper sequencing and pt sitting on edge of chair, able to scoot back in recliner with SUP. Set up assist for seated grooming tasks in recliner. Left in recliner with nurse aware and all needs in place and will cont to require skilled acute OT services to maximize his safety and IND to return to PLOF.       If plan is discharge home, recommend the following:  A lot of help with bathing/dressing/bathroom;Help with stairs or ramp for entrance;Two people to help with walking  and/or transfers   Equipment Recommendations  Other (comment) (defer)    Recommendations for Other Services      Precautions / Restrictions Precautions Precautions: Fall Recall of Precautions/Restrictions: Impaired Restrictions Weight Bearing Restrictions Per Provider Order: No       Mobility Bed Mobility Overal bed mobility: Needs Assistance Bed Mobility: Rolling, Sidelying to Sit   Sidelying to sit: Min assist, +2 for physical assistance       General bed mobility comments: Min A x2 for reaching EOB today d/t already postioned too close to R side of bed and difficulty reaching bed rail    Transfers Overall transfer level: Needs assistance Equipment used: Rolling walker (2 wheels) Transfers: Sit to/from Stand Sit to Stand: From elevated surface, Mod assist, +2 physical assistance           General transfer comment: Mod A X2 for STS from EOB to RW and for SPT to recliner with bil feet blocking required and management of RW; attempts to sit early and has to scoot to back of recliner     Balance Overall balance assessment: History of Falls, Needs assistance Sitting-balance support: Feet supported Sitting balance-Leahy Scale: Fair     Standing balance support: Bilateral upper extremity supported, Reliant on assistive device for balance Standing balance-Leahy Scale: Poor Standing balance comment: mod A x2 with RW use and some knee buckling noted in standing  ADL either performed or assessed with clinical judgement   ADL Overall ADL's : Needs assistance/impaired     Grooming: Oral care;Brushing hair;Sitting;Set up                   Toilet Transfer: Rolling walker (2 wheels);Moderate assistance;+2 for safety/equipment;+2 for physical assistance Toilet Transfer Details (indicate cue type and reason): simulated to recliner                Extremity/Trunk Assessment              Vision       Perception      Praxis     Communication Communication Communication: No apparent difficulties Factors Affecting Communication: Hearing impaired;Reduced clarity of speech   Cognition Arousal: Alert Behavior During Therapy: [facility] for tasks assessed/performed                                 Following commands: Intact, Impaired Following commands impaired: Follows one step commands with increased time      Cueing   Cueing Techniques: Verbal cues  Exercises      Shoulder Instructions       General Comments      Pertinent Vitals/ Pain       Pain Assessment Pain Assessment: Faces Faces Pain Scale: Hurts a little bit Pain Location: L heel Pain Descriptors / Indicators: Tender, Sore Pain Intervention(s): Monitored during session  Home Living                                          Prior Functioning/Environment              Frequency  Min 2X/week        Progress Toward Goals  OT Goals(current goals can now be found in the care plan section)  Progress towards OT goals: Progressing toward goals  Acute Rehab OT Goals Patient Stated Goal: improve strength OT Goal Formulation: Patient unable to participate in goal setting Time For Goal Achievement: 03/30/24 Potential to Achieve Goals: Fair  Plan      Co-evaluation    PT/OT/SLP Co-Evaluation/Treatment: Yes Reason for Co-Treatment: For patient/therapist safety;To address functional/ADL transfers PT goals addressed during session: Mobility/safety with mobility OT goals addressed during session: ADL's and self-care      AM-PAC OT "6 Clicks" Daily Activity     Outcome Measure   Help from another person eating meals?: None Help from another person taking care of personal grooming?: A Little Help from another person toileting, which includes using toliet, bedpan, or urinal?: A Lot Help from another person bathing (including washing, rinsing, drying)?: A Lot Help from another person to put on  and taking off regular upper body clothing?: A Little Help from another person to put on and taking off regular lower body clothing?: A Lot 6 Click Score: 16    End of Session Equipment Utilized During Treatment: Rolling walker (2 wheels);Gait belt;Oxygen      Activity Tolerance Patient tolerated treatment well   Patient Left with call bell/phone within reach;in chair;with chair alarm set   Nurse Communication Mobility status        Time: 9528-4132 OT Time Calculation (min): 23 min  Charges: OT General Charges $OT Visit: 1 Visit OT Treatments $Self Care/Home Management : 8-22 mins  Jaksen Fiorella, OTR/L  03/17/24, 4:09 PM   Constance Goltz 03/17/2024, 4:05 PM

## 2024-03-17 NOTE — Progress Notes (Signed)
 Physical Therapy Treatment Patient Details Name: [name] [name] MRN: [id number] DOB: 1929/07/06 Today's Date: 03/17/2024   History of Present Illness Pt admitted for SBO and is now s/p exploratory laparotomy POD 1 at time of evaluation. Initial complaints of abdominal pain accompanied with nausea and emesis. No noted fever, chills, CP. He does endorse a recent cough. He has not had a bowel movement in 3-4 days reportedly. Of note, he has a significant PMHx including recent admission in January for critical limb ischemia for which he required femoral endarterectomy, CHF (EF 35-40% in 2024), CAD s/p CABG, and on chronic anticoagulation with Eliquis. 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    PT Comments  PT/OT co-treatment this date. Pt is making good progress towards goals with ability to tolerate OOB mobility bed->chair during session. Pt hyperfocused on his scooter coming so he can go down the hallway. Still guards away from L LE with WBing and noted pain. Takes heavy assist for transfers bed->chair with RW. Once seated in chair, able to perform ADLs. Chair alarm activated. Will continue to progress. Ideally would thrive in home/familiar environment. If facility unable to take patient back, continue to recommend SNF.    If plan is discharge home, recommend the following: Two people to help with walking and/or transfers;A lot of help with bathing/dressing/bathroom;Help with stairs or ramp for entrance;Supervision due to cognitive status   Can travel by private vehicle     No  Equipment Recommendations   (TBD)    Recommendations for Other Services       Precautions / Restrictions Precautions Precautions: Fall Recall of Precautions/Restrictions: Impaired Restrictions Weight Bearing Restrictions Per Provider Order: No     Mobility  Bed Mobility Overal bed mobility: Needs Assistance Bed Mobility: Supine to  Sit     Supine to sit: Min assist, +2 for physical assistance     General bed mobility comments: needs cues for sequencing and reaching towards railing. Needs assist for scooting out towards EOB.    Transfers Overall transfer level: Needs assistance Equipment used: Rolling walker (2 wheels) Transfers: Sit to/from Stand, Bed to chair/wheelchair/BSC     Step pivot transfers: Mod assist, +2 physical assistance       General transfer comment: needs cues for sequencing. Pt is fearful of falling and needs constant reassurance and cues for sequencing. Assist required for sequencing RW. Shuffle gait pattern performed.    Ambulation/Gait               General Gait Details: unable   Stairs             Wheelchair Mobility     Tilt Bed    Modified Rankin (Stroke Patients Only)       Balance Overall balance assessment: History of Falls, Needs assistance Sitting-balance support: Feet supported Sitting balance-Leahy Scale: Fair     Standing balance support: Bilateral upper extremity supported, Reliant on assistive device for balance Standing balance-Leahy Scale: Poor                              Communication Communication Communication: No apparent difficulties Factors Affecting Communication: Hearing impaired;Reduced clarity of speech  Cognition Arousal: Alert Behavior During Therapy: WFL for tasks assessed/performed   PT - Cognitive impairments: History of cognitive impairments  PT - Cognition Comments: confused, however pleasant Following commands: Intact, Impaired Following commands impaired: Follows one step commands with increased time    Cueing Cueing Techniques: Verbal cues  Exercises Other Exercises Other Exercises: seated ADLs with OT at bedside including brushing hair/teeth. Also performed alt hip marching and LAQs x 10ish reps. Needs cues for attention to task    General Comments         Pertinent Vitals/Pain Pain Assessment Pain Assessment: Faces Faces Pain Scale: Hurts a little bit Pain Location: L heel Pain Descriptors / Indicators: Tender, Sore Pain Intervention(s): Limited activity within patient's tolerance, Monitored during session    Home Living                          Prior Function            PT Goals (current goals can now be found in the care plan section) Acute Rehab PT Goals Patient Stated Goal: to walk again PT Goal Formulation: With patient Time For Goal Achievement: 03/30/24 Potential to Achieve Goals: Good Progress towards PT goals: Progressing toward goals    Frequency    Min 2X/week      PT Plan      Co-evaluation PT/OT/SLP Co-Evaluation/Treatment: Yes Reason for Co-Treatment: For patient/therapist safety;To address functional/ADL transfers PT goals addressed during session: Mobility/safety with mobility OT goals addressed during session: ADL's and self-care      AM-PAC PT "6 Clicks" Mobility   Outcome Measure  Help needed turning from your back to your side while in a flat bed without using bedrails?: A Little Help needed moving from lying on your back to sitting on the side of a flat bed without using bedrails?: A Lot Help needed moving to and from a bed to a chair (including a wheelchair)?: A Lot Help needed standing up from a chair using your arms (e.g., wheelchair or bedside chair)?: A Lot Help needed to walk in hospital room?: Total Help needed climbing 3-5 steps with a railing? : Total 6 Click Score: 11    End of Session Equipment Utilized During Treatment: Gait belt Activity Tolerance: Patient tolerated treatment well Patient left: in chair;with chair alarm set Nurse Communication: Mobility status PT Visit Diagnosis: Unsteadiness on feet (R26.81);Muscle weakness (generalized) (M62.81);History of falling (Z91.81);Difficulty in walking, not elsewhere classified (R26.2)     Time: 1610-9604 PT Time  Calculation (min) (ACUTE ONLY): 23 min  Charges:    $Therapeutic Exercise: 8-22 mins PT General Charges $$ ACUTE PT VISIT: 1 Visit                     Elizabeth Palau, PT, DPT, GCS [phone]    Raechell Singleton 03/17/2024, 4:20 PM

## 2024-03-18 ENCOUNTER — Inpatient Hospital Stay

## 2024-03-18 DIAGNOSIS — K56609 Unspecified intestinal obstruction, unspecified as to partial versus complete obstruction: Secondary | ICD-10-CM | POA: Diagnosis not present

## 2024-03-18 LAB — HEPARIN LEVEL (UNFRACTIONATED)
Heparin Unfractionated: 0.27 [IU]/mL — ABNORMAL LOW (ref 0.30–0.70)
Heparin Unfractionated: 0.36 [IU]/mL (ref 0.30–0.70)
Heparin Unfractionated: 0.35 [IU]/mL (ref 0.30–0.70)

## 2024-03-18 LAB — BASIC METABOLIC PANEL
Anion gap: 8 (ref 5–15)
BUN: 20 mg/dL (ref 8–23)
CO2: 24 mmol/L (ref 22–32)
Calcium: 7.6 mg/dL — ABNORMAL LOW (ref 8.9–10.3)
Chloride: 108 mmol/L (ref 98–111)
Creatinine, Ser: 0.72 mg/dL (ref 0.61–1.24)
GFR, Estimated: >60 mL/min (ref >60)
Glucose, Bld: 87 mg/dL (ref 70–99)
Potassium: 2.9 mmol/L — ABNORMAL LOW (ref 3.5–5.1)
Sodium: 140 mmol/L (ref 135–145)

## 2024-03-18 LAB — PHOSPHORUS: Phosphorus: 2 mg/dL — ABNORMAL LOW (ref 2.5–4.6)

## 2024-03-18 LAB — CBC
HCT: 25.2 % — ABNORMAL LOW (ref 39.0–52.0)
Hemoglobin: 8.5 g/dL — ABNORMAL LOW (ref 13.0–17.0)
MCH: 29.9 pg (ref 26.0–34.0)
MCHC: 33.7 g/dL (ref 30.0–36.0)
MCV: 88.7 fL (ref 80.0–100.0)
Platelets: 177 10*3/uL (ref 150–400)
RBC: 2.84 MIL/uL — ABNORMAL LOW (ref 4.22–5.81)
RDW: 22.7 % — ABNORMAL HIGH (ref 11.5–15.5)
WBC: 13 10*3/uL — ABNORMAL HIGH (ref 4.0–10.5)
nRBC: 0.5 % — ABNORMAL HIGH (ref 0.0–0.2)

## 2024-03-18 LAB — BPAM RBC
Blood Product Expiration Date: 202504152359
ISSUE DATE / TIME: 202503202002
Unit Type and Rh: 5100

## 2024-03-18 LAB — APTT: aPTT: 68 s — ABNORMAL HIGH (ref 24–36)

## 2024-03-18 LAB — TYPE AND SCREEN
ABO/RH(D): O NEG
Antibody Screen: NEGATIVE
Unit division: 0

## 2024-03-18 LAB — MAGNESIUM: Magnesium: 2 mg/dL (ref 1.7–2.4)

## 2024-03-18 MED ORDER — KCL IN DEXTROSE-NACL 40-5-0.45 MEQ/L-%-% IV SOLN
INTRAVENOUS | Status: AC
  Filled 2024-03-18 (×3): qty 1000

## 2024-03-18 MED ORDER — POTASSIUM CHLORIDE 10 MEQ/100ML IV SOLN
10.0000 meq | INTRAVENOUS | Status: AC
Start: 2024-03-18 — End: 2024-03-18
  Administered 2024-03-18 (×4): 10 meq via INTRAVENOUS
  Filled 2024-03-18: qty 100

## 2024-03-18 MED ORDER — DIATRIZOATE MEGLUMINE & SODIUM 66-10 % PO SOLN: 90.0000 mL | Freq: Once | ORAL | Status: DC

## 2024-03-18 MED ORDER — POTASSIUM PHOSPHATES 15 MMOLE/5ML IV SOLN: 15.0000 mmol | Freq: Once | INTRAVENOUS | Status: DC

## 2024-03-18 MED ORDER — DIATRIZOATE MEGLUMINE & SODIUM 66-10 % PO SOLN
90.0000 mL | Freq: Once | ORAL | Status: AC
Start: 2024-03-18 — End: 2024-03-18
  Administered 2024-03-18: 90 mL via ORAL

## 2024-03-18 NOTE — Consult Note (Signed)
 PHARMACY - ANTICOAGULATION CONSULT NOTE  Pharmacy Consult for heparin infusion Indication: atrial fibrillation  Allergies  Allergen Reactions   Cephalexin Rash   Spironolactone Nausea Only   Codeine Rash and Other (See Comments)    Can not sleep   Doxycycline Rash    Patient Measurements: Height: 5\' 7"  (170.2 cm) Weight: 69.6 kg (153 lb 7 oz) IBW/kg (Calculated) : 66.1 Heparin Dosing Weight: 62 kg  Vital Signs: Temp: 98 F (36.7 C) (03/21 1444) BP: 119/69 (03/21 1444) Pulse Rate: 86 (03/21 1444)  Labs: Recent Labs    03/16/24 0532 03/17/24 0501 03/17/24 0830 03/17/24 0830 03/17/24 1640 03/18/24 0118 03/18/24 1024  HGB 8.5* 7.5*  --   --   --  8.5*  --   HCT 25.7* 23.2*  --   --   --  25.2*  --   PLT 220 171  --   --   --  177  --   APTT  --   --  34  --  67* 68*  --   LABPROT  --   --  16.9*  --   --   --   --   INR  --   --  1.4*  --   --   --   --   HEPARINUNFRC  --   --  <0.10*   < > 0.31 0.27* 0.36  CREATININE 1.24 0.96  --   --   --  0.72  --    < > = values in this interval not displayed.    Estimated Creatinine Clearance: 55.1 mL/min (by C-G formula based on SCr of 0.72 mg/dL).   Medical History: Past Medical History:  Diagnosis Date   A-fib [facility])    a.) CHA2DS2VASc = 5 (age x 2, CHF, HTN, previous MI/vascular disease history);  b.) s/p DCCV 03/05/1993; c.) rate/rhythm maintained on oral metoprolol succinate; chronically anticoagulated with dose reduced apixaban   Aortic atherosclerosis (HCC)    B12 deficiency    CHF (congestive heart failure) (HCC)    a.) TTE 11/05/12: EF 25-35, dist sep AK, mild MR; b.) TTE 05/10/14: EF 30, mild LVH, mod BAE, mild AR/PR, mod MR/TR; c.) TTE 03/12/20: EF 20, sev glob HK, mod BAE, mild LAE, mod RAE, triv PR, mild AR, sev MR/TR; d.) TTE 08/21/21: EF 30, sev glob HK, apical dyskinesis, mild LVH, mod BAE, triv AR/PR, mod MR, sev TR; e.) TTE 09/25/23: EF 35-40,  diff AK/HK, G2DD, sev BAE, RVE, RVSP 66.3, mild MR, AoV calc   Cor  pulmonale (HCC)    Coronary artery disease 02/21/1993   a.) LHC 02/21/1993 : 95% pLAD, 75% OM1, 50% OM3, 25-50% pRCA, 50% large anterolateral lesion --> transfer to [facility] for CABG; b.) s/p 2v CABG 02/26/1993 (LIMA-LAD, SVG-OM1)   Family history of adverse reaction to anesthesia    a.) PONV in 1st degree relative (daughter)   GERD (gastroesophageal reflux disease)    History of Rocky [place] spotted fever    HLD (hyperlipidemia)    HTN (hypertension), benign 04/25/2014   Hypothyroidism    Iron deficiency anemia    On apixaban therapy    Posterolateral myocardial infarction (HCC) 02/21/1993   LHC 02/21/1993 : 95% pLAD, 75% OM1, 50% OM3, 25-50% pRCA, 50% large anterolateral lesion --> transfer to [facility] for CABG; b.) s/p 2v CABG 02/26/1993 (LIMA-LAD, SVG-OM1)   Pulmonary HTN (HCC)    a.) TTE 05/10/2014: RVSP 48.6; b.) TTE 03/12/2020: RVSP 73.3; c.) TTE  09/25/2023: RVSP 66.3  PVD (peripheral vascular disease) (HCC)    S/P CABG x 2 02/26/1993   a.) LIMA-LAD, SVG-OM1 --> complicated by atrial flutter on POD4 --> Tx'd with digoxin + procainamide with no resolution. POD5 diltiazem gtt added with no improvement. Ultimately required DCCV on 03/05/1993.   Squamous cell carcinoma of skin 11/18/2021   right ear and mandible, EDC   Squamous cell carcinoma of skin 03/18/2022   right mandible and ear/refer for Mohs/ Dr. Adriana Simas has referred pt to Dr. Nedra Hai in otolaryngology excised by Dr. Nedra Hai 05/03/22, Radiation with Dr. Rushie Chestnut   Type 2 diabetes mellitus with peripheral angiopathy (HCC) 11/10/2018    Medications:  Patient on Eliquis 2.5 mg po BID PTA  Assessment: 89 yo male presented to the ED with complaints of N/V and lower abdominal pain.  Patient admitted with SBO.  On 3/18 patient taken to OR for exploratory laparotomy and reduction of small bowel volvulus.  Patient has history of Afib on Eliquis which has been held since admission.  Pharmacy consulted to initiate heparin infusion.  Baseline labs:  aPTT 34s, INR 1.4, Hgb 7.5, PLT wnl  Goal of Therapy:  Heparin level 0.3-0.7 units/ml Monitor platelets by anticoagulation protocol: Yes   Plan:  heparin level therapeutic x 2 No boluses per surgery  Monitor hgb closely Continue heparin infusion at 950 units/hr Recheck heparin level once daily while therapeutic: next 03/22 am  Daily CBC while on heparin  Lowella Bandy, PharmD 03/18/2024 2:54 PM

## 2024-03-18 NOTE — Consult Note (Signed)
 WOC Nurse Consult Note: Reason for Consult: R arm skin tear  Wound type: partial thickness loss r/t trauma  Pressure Injury POA: NA  Measurement: see nursing flowsheet  Wound bed: pink moist  Drainage (amount, consistency, odor) per nursing flowsheet  Periwound:  Dressing procedure/placement/frequency:  Cleanse R arm skin tear with NS, apply Vaseline gauze to wound bed every other day, cover with Telfa non-stick dressing and secure with Kerlix roll gauze or silicone foam whichever is preferred. SOAK OFF OLD DRESSING WITH NORMAL SALINE IF STUCK TO WOUND BED FOR ATRAUMATIC REMOVAL.   POC discussed with bedside nurse. WOC team will not follow. Re-consult if further needs arise.   Thank you,    Priscella Mann MSN, RN-BC, [employer] [phone]

## 2024-03-18 NOTE — Progress Notes (Signed)
 Pharmacy Antibiotic Note  [name] [name] is a 89 y.o. male admitted on 03/14/2024 with intra-abdominal infection.  Pharmacy has been consulted for Zosyn dosing.  Plan: Continue Zosyn 3.375g IV q8h (4 hour infusion).  Pharmacy will continue to follow and will adjust abx dosing whenever warranted.  Temp (24hrs), Avg:97.9 F (36.6 C), Min:97.5 F (36.4 C), Max:98.3 F (36.8 C)   Recent Labs  Lab 03/15/24 0013 03/15/24 0156 03/15/24 0405 03/15/24 0823 03/16/24 0532 03/17/24 0501 03/18/24 0118  WBC 16.3*  --   --  18.3* 26.2* 17.3* 13.0*  CREATININE 1.15  --   --   --  1.24 0.96 0.72  LATICACIDVEN  --  2.1* 2.4* 2.7*  --   --   --     Estimated Creatinine Clearance: 55.1 mL/min (by C-G formula based on SCr of 0.72 mg/dL).    Allergies  Allergen Reactions   Cephalexin Rash   Spironolactone Nausea Only   Codeine Rash and Other (See Comments)    Can not sleep   Doxycycline Rash    Antimicrobials this admission: 3/18 Zosyn >>   Microbiology results: 3/18 BCx: ngtd  Thank you for allowing pharmacy to be a part of this patient's care.  Barrie Folk, PharmD 03/18/2024 7:21 AM

## 2024-03-18 NOTE — Consult Note (Signed)
 PHARMACY - ANTICOAGULATION CONSULT NOTE  Pharmacy Consult for heparin infusion Indication: atrial fibrillation  Allergies  Allergen Reactions   Cephalexin Rash   Spironolactone Nausea Only   Codeine Rash and Other (See Comments)    Can not sleep   Doxycycline Rash    Patient Measurements: Height: 5\' 7"  (170.2 cm) Weight: 69.6 kg (153 lb 7 oz) IBW/kg (Calculated) : 66.1 Heparin Dosing Weight: 62 kg  Vital Signs: Temp: 97.9 F (36.6 C) (03/21 0405) Temp Source: (P) Oral (03/20 2318) BP: 120/65 (03/21 0405) Pulse Rate: 86 (03/21 0405)  Labs: Recent Labs    03/16/24 0532 03/17/24 0501 03/17/24 0830 03/17/24 0830 03/17/24 1640 03/18/24 0118 03/18/24 1024  HGB 8.5* 7.5*  --   --   --  8.5*  --   HCT 25.7* 23.2*  --   --   --  25.2*  --   PLT 220 171  --   --   --  177  --   APTT  --   --  34  --  67* 68*  --   LABPROT  --   --  16.9*  --   --   --   --   INR  --   --  1.4*  --   --   --   --   HEPARINUNFRC  --   --  <0.10*   < > 0.31 0.27* 0.36  CREATININE 1.24 0.96  --   --   --  0.72  --    < > = values in this interval not displayed.    Estimated Creatinine Clearance: 55.1 mL/min (by C-G formula based on SCr of 0.72 mg/dL).   Medical History: Past Medical History:  Diagnosis Date   A-fib [facility])    a.) CHA2DS2VASc = 5 (age x 2, CHF, HTN, previous MI/vascular disease history);  b.) s/p DCCV 03/05/1993; c.) rate/rhythm maintained on oral metoprolol succinate; chronically anticoagulated with dose reduced apixaban   Aortic atherosclerosis (HCC)    B12 deficiency    CHF (congestive heart failure) (HCC)    a.) TTE 11/05/12: EF 25-35, dist sep AK, mild MR; b.) TTE 05/10/14: EF 30, mild LVH, mod BAE, mild AR/PR, mod MR/TR; c.) TTE 03/12/20: EF 20, sev glob HK, mod BAE, mild LAE, mod RAE, triv PR, mild AR, sev MR/TR; d.) TTE 08/21/21: EF 30, sev glob HK, apical dyskinesis, mild LVH, mod BAE, triv AR/PR, mod MR, sev TR; e.) TTE 09/25/23: EF 35-40,  diff AK/HK, G2DD, sev BAE,  RVE, RVSP 66.3, mild MR, AoV calc   Cor pulmonale (HCC)    Coronary artery disease 02/21/1993   a.) LHC 02/21/1993 : 95% pLAD, 75% OM1, 50% OM3, 25-50% pRCA, 50% large anterolateral lesion --> transfer to [facility] for CABG; b.) s/p 2v CABG 02/26/1993 (LIMA-LAD, SVG-OM1)   Family history of adverse reaction to anesthesia    a.) PONV in 1st degree relative (daughter)   GERD (gastroesophageal reflux disease)    History of [place] spotted fever    HLD (hyperlipidemia)    HTN (hypertension), benign 04/25/2014   Hypothyroidism    Iron deficiency anemia    On apixaban therapy    Posterolateral myocardial infarction (HCC) 02/21/1993   LHC 02/21/1993 : 95% pLAD, 75% OM1, 50% OM3, 25-50% pRCA, 50% large anterolateral lesion --> transfer to [facility] for CABG; b.) s/p 2v CABG 02/26/1993 (LIMA-LAD, SVG-OM1)   Pulmonary HTN (HCC)    a.) TTE 05/10/2014: RVSP 48.6; b.) TTE 03/12/2020: RVSP 73.3;  c.) TTE  09/25/2023: RVSP 66.3   PVD (peripheral vascular disease) (HCC)    S/P CABG x 2 02/26/1993   a.) LIMA-LAD, SVG-OM1 --> complicated by atrial flutter on POD4 --> Tx'd with digoxin + procainamide with no resolution. POD5 diltiazem gtt added with no improvement. Ultimately required DCCV on 03/05/1993.   Squamous cell carcinoma of skin 11/18/2021   right ear and mandible, EDC   Squamous cell carcinoma of skin 03/18/2022   right mandible and ear/refer for Mohs/ Dr. Adriana Simas has referred pt to Dr. Nedra Hai in otolaryngology excised by Dr. Nedra Hai 05/03/22, Radiation with Dr. Rushie Chestnut   Type 2 diabetes mellitus with peripheral angiopathy (HCC) 11/10/2018    Medications:  Patient on Eliquis 2.5 mg po BID PTA  Assessment: 89 yo male presented to the ED with complaints of N/V and lower abdominal pain.  Patient admitted with SBO.  On 3/18 patient taken to OR for exploratory laparotomy and reduction of small bowel volvulus.  Patient has history of Afib on Eliquis which has been held since admission.  Pharmacy consulted to  initiate heparin infusion.  Baseline labs: aPTT 34s, INR 1.4, Hgb 7.5, PLT wnl  Goal of Therapy:  Heparin level 0.3-0.7 units/ml aPTT 66-102 seconds Monitor platelets by anticoagulation protocol: Yes   Plan:  heparin level therapeutic No boluses per surgery  Monitor hgb closely Continue heparin infusion at 950 units/hr Recheck HL in 8 hrs to confirm Daily CBC while on heparin  Barrie Folk, PharmD 03/18/2024 10:47 AM

## 2024-03-18 NOTE — Progress Notes (Signed)
 Physical Therapy Treatment Patient Details Name: [name] [name] MRN: [id number] DOB: [birth year] Today's Date: 03/18/2024   History of Present Illness Pt admitted for SBO and is now s/p exploratory laparotomy POD 1 at time of evaluation. Initial complaints of abdominal pain accompanied with nausea and emesis. No noted fever, chills, CP. He does endorse a recent cough. He has not had a bowel movement in 3-4 days reportedly. Of note, he has a significant PMHx including recent admission in January for critical limb ischemia for which he required femoral endarterectomy, CHF (EF 35-40% in 2024), CAD s/p CABG, and on chronic anticoagulation with Eliquis. 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    PT Comments  Pt received in bed, daughter by his side waiting for transportation to go to Radiology but agreeable to participate in therapeutic exercises and refused ot get OOB. PT provided AROM to BLE while training daughter including Ankle pumps, Heel slides, Leg raises and glute sets 2 x 01 reps each. Provided the same exs in written instructions on white board. Pt and daughter demonstrated good understanding. Pt will benefit form re-enforcement. Pt referred to Mobility specialist to promote mobility. Pt tol tx well. PT will continue in acute. Current POC remains appropriate. Pt and daughter agrees with POC.    If plan is discharge home, recommend the following: Two people to help with walking and/or transfers;A lot of help with bathing/dressing/bathroom;Help with stairs or ramp for entrance;Supervision due to cognitive status   Can travel by private vehicle     No  Equipment Recommendations  None recommended by PT    Recommendations for Other Services       Precautions / Restrictions Precautions Precautions: Fall Recall of Precautions/Restrictions: Impaired Restrictions Weight Bearing Restrictions Per Provider Order: No      Mobility  Bed Mobility               General bed mobility comments: Pt was waiting to be taken for Imaging .    Transfers                   General transfer comment: deferrred as pt waiting to to be taken for imaging.    Ambulation/Gait               General Gait Details: deferred   Stairs             Wheelchair Mobility     Tilt Bed    Modified Rankin (Stroke Patients Only)       Balance                                            Communication Communication Communication: No apparent difficulties Factors Affecting Communication: Hearing impaired;Reduced clarity of speech  Cognition Arousal: Alert Behavior During Therapy: WFL for tasks assessed/performed   PT - Cognitive impairments: History of cognitive impairments                       PT - Cognition Comments: confused, however pleasant Following commands: Intact, Impaired Following commands impaired: Follows one step commands with increased time    Cueing Cueing Techniques: Verbal cues, Other (comments) (written instructions)  Exercises General Exercises - Lower Extremity Ankle Circles/Pumps: AROM, Both, 20 reps, Supine Gluteal Sets: Both, 20 reps, Supine Heel Slides: AROM, Both, 20  reps, Supine Straight Leg Raises: AROM, Both, 20 reps, Supine Other Exercises Other Exercises: same exs provided in writting on [employer]. Family pesent    General Comments        Pertinent Vitals/Pain Pain Assessment Pain Assessment: No/denies pain    Home Living                          Prior Function            PT Goals (current goals can now be found in the care plan section) Progress towards PT goals: Progressing toward goals    Frequency    Min 2X/week      PT Plan      Co-evaluation              AM-PAC PT "6 Clicks" Mobility   Outcome Measure  Help needed turning from your back to your side while in a flat bed  without using bedrails?: A Little Help needed moving from lying on your back to sitting on the side of a flat bed without using bedrails?: A Lot Help needed moving to and from a bed to a chair (including a wheelchair)?: A Lot Help needed standing up from a chair using your arms (e.g., wheelchair or bedside chair)?: A Lot Help needed to walk in hospital room?: Total Help needed climbing 3-5 steps with a railing? : Total 6 Click Score: 11    End of Session   Activity Tolerance: Patient tolerated treatment well Patient left: in bed;with call bell/phone within reach;with bed alarm set;with family/visitor present Nurse Communication: Mobility status PT Visit Diagnosis: Unsteadiness on feet (R26.81);Muscle weakness (generalized) (M62.81);History of falling (Z91.81);Difficulty in walking, not elsewhere classified (R26.2)     Time: 1208-1220 PT Time Calculation (min) (ACUTE ONLY): 12 min  Charges:    $Therapeutic Exercise: 8-22 mins PT General Charges $$ ACUTE PT VISIT: 1 Visit                     Janet Berlin PT DPT 1:37 PM,03/18/24

## 2024-03-18 NOTE — Progress Notes (Signed)
 [facility] SURGICAL PROGRESS NOTE  Hospital Day(s): 3.   Post op day(s): 3 Days Post-Op.   Interval History:  Patient seen and examined No issues overnight  Patient much more alert this morning, less confused Reports his abdomen is non-tender No fever, emesis  Leukocytosis improving; WBC 13.0K Hgb to 8.5 - responded to pRBC Renal function normal; sCr - 0.72: UO - 850 ccs Hypokalemia to 2.9 Hypophosphatemia to 2.0 He is NPO He continues on Zosyn   Vital signs in last 24 hours: [min-max] current  Temp:  [97.5 F (36.4 C)-98.3 F (36.8 C)] 97.9 F (36.6 C) (03/21 0405) Pulse Rate:  [76-86] 86 (03/21 0405) Resp:  [16-18] 16 (03/21 0405) BP: (104-129)/(48-80) 120/65 (03/21 0405) SpO2:  [90 %-100 %] 92 % (03/21 0405) Weight:  [69.6 kg] 69.6 kg (03/21 0500)     Height: 5\' 7"  (170.2 cm) Weight: 69.6 kg BMI (Calculated): 24.03   Intake/Output last 2 shifts:  03/20 0701 - 03/21 0700 In: 942.5 [I.V.:58.5; Blood:732; IV Piggyback:151.9] Out: 850 [Urine:850]   Physical Exam:  Constitutional: alert, cooperative and no distress; confused Respiratory: breathing non-labored at rest  Cardiovascular: regular rate and sinus rhythm  Gastrointestinal: soft, no significant tenderness, mild distension, no rebound/guarding Integumentary: Laparotomy is intact with staples; no erythema or drainage   Labs:     Latest Ref Rng & Units 03/18/2024    1:18 AM 03/17/2024    5:01 AM 03/16/2024    5:32 AM  CBC  WBC 4.0 - 10.5 K/uL 13.0  17.3  26.2   Hemoglobin 13.0 - 17.0 g/dL 8.5  7.5  8.5   Hematocrit 39.0 - 52.0 % 25.2  23.2  25.7   Platelets 150 - 400 K/uL 177  171  220       Latest Ref Rng & Units 03/18/2024    1:18 AM 03/17/2024    5:01 AM 03/16/2024    5:32 AM  CMP  Glucose 70 - 99 mg/dL 87  88  191   BUN 8 - 23 mg/dL 20  31  38   Creatinine 0.61 - 1.24 mg/dL 4.78  2.95  6.21   Sodium 135 - 145 mmol/L 140  141  138   Potassium 3.5 - 5.1 mmol/L 2.9  3.2  3.8    Chloride 98 - 111 mmol/L 108  109  108   CO2 22 - 32 mmol/L 24  23  25    Calcium 8.9 - 10.3 mg/dL 7.6  7.6  7.3      Imaging studies:  No new pertinent imaging studies   Assessment/Plan:  89 y.o. male 3 Days Post-Op s/p exploratory laparotomy and reduction of small bowel volvulus with hemorrhagic/edematous but viable bowel, complicated by pertinent comorbidities including advanced age, deconditioning, significant cardiac history on Eliquis, and SCC currently on immunotherapy.    - We will get gastrografin challenge this AM to reassess bowel function/ileus and hopefully initiate bowel function. Will get 4-hr and 8-hr delay KUB. Pending findings, can consider PO intake. If unable to start diet, we will need to consider TPN.    - Monitor abdominal examination; on-going bowel function  - Pain control prn; antiemetics prn  - Monitor leukocytosis; improving - Monitor H&H; ; improved with pRBC, no overt evidence to suggest bleeding, transfuse <7  - Okay for heparin - Okay to work with therapies; on-board   - Further management per primary service; we will follow    All of the above findings and recommendations were discussed  with the patient, and the medical team, and all of patient's questions were answered to his expressed satisfaction.  -- Lynden Oxford, PA-C [facility] 03/18/2024, 7:39 AM M-F: 7am - 4pm

## 2024-03-18 NOTE — Consult Note (Signed)
 PHARMACY CONSULT NOTE - ELECTROLYTES  Pharmacy Consult for Electrolyte Monitoring and Replacement   Recent Labs: Height: 5\' 7"  (170.2 cm) Weight: 69.6 kg (153 lb 7 oz) IBW/kg (Calculated) : 66.1 Estimated Creatinine Clearance: 55.1 mL/min (by C-G formula based on SCr of 0.72 mg/dL).  Potassium (mmol/L)  Date Value  03/18/2024 2.9 (L)  02/04/2015 4.2   Magnesium (mg/dL)  Date Value  78/29/5621 2.0   Calcium (mg/dL)  Date Value  30/86/5784 7.6 (L)   Calcium, Total (mg/dL)  Date Value  69/62/9528 8.5   Albumin (g/dL)  Date Value  41/32/4401 3.5  02/04/2015 3.5   Phosphorus (mg/dL)  Date Value  02/72/5366 2.0 (L)   Sodium (mmol/L)  Date Value  03/18/2024 140  02/04/2015 141    Assessment  [name] [name] is a 89 y.o. male presenting with a small bowel obstruction. PMH significant for severe PAD, systolic CHF secondary to ischemic cardiomyopathy, EF 35 to 40% 08/2023, CAD s/p CABG times 03/16/1998, and HTN. Pharmacy has been consulted to monitor and replace electrolytes.  Diet: NPO MIVF: D5/0.45 NS plus KCl 20 @ 100 mL/hr  Goal of Therapy: Electrolytes WNL  Plan:  K = 2.9 , Order Kcl 10 mEq IV x 4 Phos = 2.0, Kphos IV from last night still running when lab drawn Check BMP, Mg, Phos with AM labs  Thank you for allowing pharmacy to be a part of this patient's care.  Barrie Folk, PharmD 03/18/2024 7:12 AM

## 2024-03-18 NOTE — Plan of Care (Signed)

## 2024-03-18 NOTE — Progress Notes (Addendum)
 PROGRESS NOTE    [name] [name]  UUV:[id number] DOB: [birth year] DOA: 03/14/2024 PCP: [name], MD  Outpatient Specialists: cardiology, oncology    Brief Narrative:   From admission h and p  ndrew KASPER [name] is a 89 y.o. male with medical history significant for Severe PAD s/p multiple revascularization most recently 01/22/2024 for critical limb ischemia, and on chronic anticoagulation with Eliquis, systolic CHF secondary to ischemic cardiomyopathy, EF 35 to 40% 08/2023, CAD s/p CABG times 03/16/1998, HTN, who is being admitted with a small bowel obstruction, after presenting with nausea vomiting lower abdominal pain that started around noon on 3/17.  He had no diarrhea, no blood in the stool, no fever or chills.   Assessment & Plan:   Principal Problem:   Small bowel obstruction (HCC) Active Problems:   Lactic acidosis   CAD with history of CABG x3 (coronary artery disease)   PVD (peripheral vascular disease) (HCC)   Chronic anticoagulation   History of revascularization procedure of lower extremity   Chronic heart failure with reduced ejection fraction (HFrEF, <= 40%) (HCC)   Ischemic cardiomyopathy   HTN (hypertension), benign   Acquired hypothyroidism   A-fib (HCC)   Type 2 diabetes mellitus with peripheral angiopathy (HCC)   Recurrent squamous cell carcinoma in situ (SCCIS) of skin of cheek   Protein-calorie malnutrition, severe  # SBO POD3 from ex lap with lysis of adhesion. Volvulus with hemorrhagic bowel encountered, no resection performed. No bm but having flatus, abdomen less distended today - npo - no ng tube didn't tolerate, hasn't been vomiting - diet and abx per gen surg, now s/p zosyn - pain control - continue fluids - gastrograffin challenge today, if unable to begin to advance diet tomorrow would start TPN  # Delirium After surgery , appears resolved - delirium precautions  # Acute blood loss anemia Hgb drifted to 7.5 on 3/20. given cad transfused  1 u to maintain above 8, appropriate rise today to 8.5.  - monitor  # SCC left neck - wound care RN recs placed 3/19  # Ulcer left heel Chronic, followed by wound care as outpt, daughter says slowly improving - wound care RN recs placed 3/19  # CAD Hx cabg x3, no chest pain - statin - asa/plavix as below  # Hypothyroid - cont home synthroid  # Bruise right flank Appears superficial, no internal bleeding seen on CT of chest  # HFrEF Appears euvolemic - holding home lasix and metop for now  # Skin tear Right forearm, happened today - wound care  # A-fib Rate controlled currently, BPs soft - holding home metop given soft BPs - started IV heparin 3/29  # PAD S/p multiple vascular procedures most recently January of this year - statin and aspirin and anticoagulation as above  # T2DM - SSI  # Debility Resides at [facility]. PT advises snf - TOC consulted  DVT prophylaxis: heparin IV Code Status: dnr/dni Family Communication: daughter wanda updated at bedside 3/21  Level of care: Telemetry Medical Status is: Inpatient Remains inpatient appropriate because: severity of illness    Consultants:  Gen surg  Procedures: Ex lap  Antimicrobials:  S/p zosyn    Subjective: No pain, feels well, no vomiting, improving  Objective: Vitals:   03/17/24 2036 03/17/24 2318 03/18/24 0405 03/18/24 0500  BP: 129/80 (!) (P) 112/54 120/65   Pulse: 84 (P) 76 86   Resp: 18 (P) 16 16   Temp: 98.3 F (36.8 C) (!) (  P) 97.5 F (36.4 C) 97.9 F (36.6 C)   TempSrc: Oral (P) Oral    SpO2: 100% (P) 99% 92%   Weight:    69.6 kg  Height:        Intake/Output Summary (Last 24 hours) at 03/18/2024 1345 Last data filed at 03/18/2024 0912 Gross per 24 hour  Intake 942.45 ml  Output 1100 ml  Net -157.55 ml   Filed Weights   03/16/24 1631 03/17/24 0312 03/18/24 0500  Weight: 66.4 kg 63.5 kg 69.6 kg    Examination:  General exam: Appears calm and comfortable   Respiratory system: Clear to auscultation. Respiratory effort normal. Cardiovascular system: S1 & S2 heard, RRR. Soft sytolic murmur Gastrointestinal system: Abdomen is mildly distended, mild tenderness, incision c/d/i Central nervous system: Alert and oriented to self moving all 4 Extremities: Symmetric 5 x 5 power. Skin: healing midline incision Psychiatry: mild confusion, calm    Data Reviewed: I have personally reviewed following labs and imaging studies  CBC: Recent Labs  Lab 03/15/24 0013 03/15/24 0823 03/16/24 0532 03/17/24 0501 03/18/24 0118  WBC 16.3* 18.3* 26.2* 17.3* 13.0*  NEUTROABS 13.8*  --   --   --   --   HGB 11.0* 10.0* 8.5* 7.5* 8.5*  HCT 34.3* 30.8* 25.7* 23.2* 25.2*  MCV 94.0 93.1 92.8 94.7 88.7  PLT 363 335 220 171 177   Basic Metabolic Panel: Recent Labs  Lab 03/15/24 0013 03/16/24 0532 03/17/24 0501 03/18/24 0118  NA 133* 138 141 140  K 3.6 3.8 3.2* 2.9*  CL 98 108 109 108  CO2 24 25 23 24   GLUCOSE 174* 120* 88 87  BUN 32* 38* 31* 20  CREATININE 1.15 1.24 0.96 0.72  CALCIUM 9.0 7.3* 7.6* 7.6*  MG  --   --  1.9 2.0  PHOS  --   --  1.9* 2.0*   GFR: Estimated Creatinine Clearance: 55.1 mL/min (by C-G formula based on SCr of 0.72 mg/dL). Liver Function Tests: Recent Labs  Lab 03/15/24 0013  AST 31  ALT 19  ALKPHOS 51  BILITOT 1.2  PROT 7.6  ALBUMIN 3.5   Recent Labs  Lab 03/15/24 0013  LIPASE 24   No results for input(s): "AMMONIA" in the last 168 hours. Coagulation Profile: Recent Labs  Lab 03/17/24 0830  INR 1.4*   Cardiac Enzymes: No results for input(s): "CKTOTAL", "CKMB", "CKMBINDEX", "TROPONINI" in the last 168 hours. BNP (last 3 results) No results for input(s): "PROBNP" in the last 8760 hours. HbA1C: No results for input(s): "HGBA1C" in the last 72 hours. CBG: Recent Labs  Lab 03/15/24 1337  GLUCAP 163*   Lipid Profile: No results for input(s): "CHOL", "HDL", "LDLCALC", "TRIG", "CHOLHDL", "LDLDIRECT" in  the last 72 hours. Thyroid Function Tests: No results for input(s): "TSH", "T4TOTAL", "FREET4", "T3FREE", "THYROIDAB" in the last 72 hours. Anemia Panel: No results for input(s): "VITAMINB12", "FOLATE", "FERRITIN", "TIBC", "IRON", "RETICCTPCT" in the last 72 hours. Urine analysis:    Component Value Date/Time   COLORURINE YELLOW (A) 03/15/2024 0013   APPEARANCEUR CLEAR (A) 03/15/2024 0013   APPEARANCEUR Clear 11/06/2013 1304   LABSPEC 1.018 03/15/2024 0013   LABSPEC 1.014 11/06/2013 1304   PHURINE 5.0 03/15/2024 0013   GLUCOSEU NEGATIVE 03/15/2024 0013   GLUCOSEU Negative 11/06/2013 1304   HGBUR NEGATIVE 03/15/2024 0013   BILIRUBINUR NEGATIVE 03/15/2024 0013   BILIRUBINUR Negative 11/06/2013 1304   KETONESUR NEGATIVE 03/15/2024 0013   PROTEINUR NEGATIVE 03/15/2024 0013   NITRITE NEGATIVE 03/15/2024 0013  LEUKOCYTESUR NEGATIVE 03/15/2024 0013   LEUKOCYTESUR Negative 11/06/2013 1304   Sepsis Labs: @LABRCNTIP (procalcitonin:4,lacticidven:4)  ) Recent Results (from the past 240 hours)  Culture, blood (routine x 2)     Status: None (Preliminary result)   Collection Time: 03/15/24  2:09 AM   Specimen: BLOOD  Result Value Ref Range Status   Specimen Description BLOOD LEFT ARM  Final   Special Requests   Final    BOTTLES DRAWN AEROBIC AND ANAEROBIC Blood Culture results may not be optimal due to an inadequate volume of blood received in culture bottles   Culture   Final    NO GROWTH 3 DAYS Performed at [facility], [address], [place], [place] [address]    Report Status PENDING  Incomplete  Culture, blood (routine x 2)     Status: None (Preliminary result)   Collection Time: 03/15/24  4:05 AM   Specimen: BLOOD  Result Value Ref Range Status   Specimen Description BLOOD RIGHT AC  Final   Special Requests   Final    BOTTLES DRAWN AEROBIC AND ANAEROBIC Blood Culture results may not be optimal due to an inadequate volume of blood received in culture bottles    Culture   Final    NO GROWTH 3 DAYS Performed at [facility], [address], [place], [place] [address]    Report Status PENDING  Incomplete         Radiology Studies: DG Abd Portable 1V Result Date: 03/17/2024 CLINICAL DATA:  Ileus.  Small-bowel obstruction. EXAM: PORTABLE ABDOMEN - 1 VIEW COMPARISON:  June 22, 2022. FINDINGS: Moderately dilated small bowel loops are noted concerning for distal small bowel obstruction or possibly ileus. No colonic dilatation is noted. Stool is noted in the transverse colon. Midline surgical staples are noted. Phleboliths are noted in the pelvis. IMPRESSION: Moderately dilated small bowel loops are noted concerning for distal small bowel obstruction or possibly ileus. Electronically Signed   By: Lupita Raider M.D.   On: 03/17/2024 12:18         Scheduled Meds:  Chlorhexidine Gluconate Cloth  6 each Topical Daily   leptospermum manuka honey  1 Application Topical Daily   levothyroxine  112 mcg Oral Q0600   simvastatin  40 mg Oral QPM   thiamine (VITAMIN B1) injection  100 mg Intravenous Daily   Continuous Infusions:  heparin 950 Units/hr (03/18/24 0210)   piperacillin-tazobactam (ZOSYN)  IV 3.375 g (03/18/24 0815)     LOS: 3 days     Silvano Bilis, MD [facility]   If 7PM-7AM, please contact night-coverage [email or web address] Password [email or web address] 03/18/2024, 1:45 PM

## 2024-03-18 NOTE — Consult Note (Signed)
 PHARMACY - ANTICOAGULATION CONSULT NOTE  Pharmacy Consult for heparin infusion Indication: atrial fibrillation  Allergies  Allergen Reactions   Cephalexin Rash   Spironolactone Nausea Only   Codeine Rash and Other (See Comments)    Can not sleep   Doxycycline Rash    Patient Measurements: Height: 5\' 7"  (170.2 cm) Weight: 63.5 kg (139 lb 15.9 oz) IBW/kg (Calculated) : 66.1 Heparin Dosing Weight: 62 kg  Vital Signs: Temp: (P) 97.5 F (36.4 C) (03/20 2318) Temp Source: (P) Oral (03/20 2318) BP: (P) 112/54 (03/20 2318) Pulse Rate: (P) 76 (03/20 2318)  Labs: Recent Labs    03/16/24 0532 03/17/24 0501 03/17/24 0830 03/17/24 1640 03/18/24 0118  HGB 8.5* 7.5*  --   --  8.5*  HCT 25.7* 23.2*  --   --  25.2*  PLT 220 171  --   --  177  APTT  --   --  34 67* 68*  LABPROT  --   --  16.9*  --   --   INR  --   --  1.4*  --   --   HEPARINUNFRC  --   --  <0.10* 0.31 0.27*  CREATININE 1.24 0.96  --   --  0.72    Estimated Creatinine Clearance: 52.9 mL/min (by C-G formula based on SCr of 0.72 mg/dL).   Medical History: Past Medical History:  Diagnosis Date   A-fib [facility])    a.) CHA2DS2VASc = 5 (age x 2, CHF, HTN, previous MI/vascular disease history);  b.) s/p DCCV 03/05/1993; c.) rate/rhythm maintained on oral metoprolol succinate; chronically anticoagulated with dose reduced apixaban   Aortic atherosclerosis (HCC)    B12 deficiency    CHF (congestive heart failure) (HCC)    a.) TTE 11/05/12: EF 25-35, dist sep AK, mild MR; b.) TTE 05/10/14: EF 30, mild LVH, mod BAE, mild AR/PR, mod MR/TR; c.) TTE 03/12/20: EF 20, sev glob HK, mod BAE, mild LAE, mod RAE, triv PR, mild AR, sev MR/TR; d.) TTE 08/21/21: EF 30, sev glob HK, apical dyskinesis, mild LVH, mod BAE, triv AR/PR, mod MR, sev TR; e.) TTE 09/25/23: EF 35-40,  diff AK/HK, G2DD, sev BAE, RVE, RVSP 66.3, mild MR, AoV calc   Cor pulmonale (HCC)    Coronary artery disease 02/21/1993   a.) LHC 02/21/1993 : 95% pLAD, 75% OM1, 50%  OM3, 25-50% pRCA, 50% large anterolateral lesion --> transfer to [facility] for CABG; b.) s/p 2v CABG 02/26/1993 (LIMA-LAD, SVG-OM1)   Family history of adverse reaction to anesthesia    a.) PONV in 1st degree relative (daughter)   GERD (gastroesophageal reflux disease)    History of [place] spotted fever    HLD (hyperlipidemia)    HTN (hypertension), benign 04/25/2014   Hypothyroidism    Iron deficiency anemia    On apixaban therapy    Posterolateral myocardial infarction (HCC) 02/21/1993   LHC 02/21/1993 : 95% pLAD, 75% OM1, 50% OM3, 25-50% pRCA, 50% large anterolateral lesion --> transfer to [facility]G; b.) s/p 2v CABG 02/26/1993 (LIMA-LAD, SVG-OM1)   Pulmonary HTN (HCC)    a.) TTE 05/10/2014: RVSP 48.6; b.) TTE 03/12/2020: RVSP 73.3; c.) TTE  09/25/2023: RVSP 66.3   PVD (peripheral vascular disease) (HCC)    S/P CABG x 2 02/26/1993   a.) LIMA-LAD, SVG-OM1 --> complicated by atrial flutter on POD4 --> Tx'd with digoxin + procainamide with no resolution. POD5 diltiazem gtt added with no improvement. Ultimately required DCCV on 03/05/1993.   Squamous cell carcinoma  of skin 11/18/2021   right ear and mandible, EDC   Squamous cell carcinoma of skin 03/18/2022   right mandible and ear/refer for Mohs/ Dr. Adriana Simas has referred pt to Dr. Nedra Hai in otolaryngology excised by Dr. Nedra Hai 05/03/22, Radiation with Dr. Rushie Chestnut   Type 2 diabetes mellitus with peripheral angiopathy (HCC) 11/10/2018    Medications:  Patient on Eliquis 2.5 mg po BID PTA  Assessment: 89 yo male presented to the ED with complaints of N/V and lower abdominal pain.  Patient admitted with SBO.  On 3/18 patient taken to OR for exploratory laparotomy and reduction of small bowel volvulus.  Patient has history of Afib on Eliquis which has been held since admission.  Pharmacy consulted to initiate heparin infusion.  Baseline labs: aPTT 34s, INR 1.4, Hgb 7.5, PLT wnl  Goal of Therapy:  Heparin level 0.3-0.7 units/ml aPTT 66-102  seconds Monitor platelets by anticoagulation protocol: Yes   Plan:  heparin level subtherapeutic No boluses per surgery  Monitor hgb closely Increase heparin infusion to 950 units/hr Recheck HL in 8 hrs after rate change Daily CBC while on heparin  Otelia Sergeant, PharmD, [facility] 03/18/2024 2:06 AM

## 2024-03-19 ENCOUNTER — Inpatient Hospital Stay

## 2024-03-19 DIAGNOSIS — K56609 Unspecified intestinal obstruction, unspecified as to partial versus complete obstruction: Secondary | ICD-10-CM | POA: Diagnosis not present

## 2024-03-19 LAB — CBC
HCT: 22.7 % — ABNORMAL LOW (ref 39.0–52.0)
Hemoglobin: 7.7 g/dL — ABNORMAL LOW (ref 13.0–17.0)
MCH: 29.8 pg (ref 26.0–34.0)
MCHC: 33.9 g/dL (ref 30.0–36.0)
MCV: 88 fL (ref 80.0–100.0)
Platelets: 195 10*3/uL (ref 150–400)
RBC: 2.58 MIL/uL — ABNORMAL LOW (ref 4.22–5.81)
RDW: 22.4 % — ABNORMAL HIGH (ref 11.5–15.5)
WBC: 10.8 10*3/uL — ABNORMAL HIGH (ref 4.0–10.5)
nRBC: 0.3 % — ABNORMAL HIGH (ref 0.0–0.2)

## 2024-03-19 LAB — BASIC METABOLIC PANEL
Anion gap: 5 (ref 5–15)
BUN: 14 mg/dL (ref 8–23)
CO2: 23 mmol/L (ref 22–32)
Calcium: 7.6 mg/dL — ABNORMAL LOW (ref 8.9–10.3)
Chloride: 110 mmol/L (ref 98–111)
Creatinine, Ser: 0.64 mg/dL (ref 0.61–1.24)
GFR, Estimated: >60 mL/min (ref >60)
Glucose, Bld: 101 mg/dL — ABNORMAL HIGH (ref 70–99)
Potassium: 3.6 mmol/L (ref 3.5–5.1)
Sodium: 138 mmol/L (ref 135–145)

## 2024-03-19 LAB — PHOSPHORUS
Phosphorus: 1.4 mg/dL — ABNORMAL LOW (ref 2.5–4.6)
Phosphorus: 1.9 mg/dL — ABNORMAL LOW (ref 2.5–4.6)

## 2024-03-19 LAB — HEPARIN LEVEL (UNFRACTIONATED)
Heparin Unfractionated: 0.28 [IU]/mL — ABNORMAL LOW (ref 0.30–0.70)
Heparin Unfractionated: 0.4 [IU]/mL (ref 0.30–0.70)
Heparin Unfractionated: 0.37 [IU]/mL (ref 0.30–0.70)

## 2024-03-19 LAB — HEMOGLOBIN: Hemoglobin: 7.5 g/dL — ABNORMAL LOW (ref 13.0–17.0)

## 2024-03-19 LAB — MAGNESIUM: Magnesium: 2 mg/dL (ref 1.7–2.4)

## 2024-03-19 LAB — POTASSIUM: Potassium: 3.7 mmol/L (ref 3.5–5.1)

## 2024-03-19 MED ORDER — POTASSIUM PHOSPHATES 15 MMOLE/5ML IV SOLN
20.0000 mmol | Freq: Once | INTRAVENOUS | Status: AC
  Administered 2024-03-19: 20 mmol via INTRAVENOUS
  Filled 2024-03-19: qty 6.67

## 2024-03-19 MED ORDER — POTASSIUM PHOSPHATES 15 MMOLE/5ML IV SOLN
20.0000 mmol | Freq: Once | INTRAVENOUS | Status: AC
  Administered 2024-03-20: 20 mmol via INTRAVENOUS
  Filled 2024-03-19: qty 6.67

## 2024-03-19 NOTE — Consult Note (Addendum)
 PHARMACY CONSULT NOTE - ELECTROLYTES  Pharmacy Consult for Electrolyte Monitoring and Replacement   Recent Labs: Height: 5\' 7"  (170.2 cm) Weight: 69.4 kg (153 lb) IBW/kg (Calculated) : 66.1 Estimated Creatinine Clearance: 55.1 mL/min (by C-G formula based on SCr of 0.64 mg/dL).  Potassium (mmol/L)  Date Value  03/19/2024 3.6  02/04/2015 4.2   Magnesium (mg/dL)  Date Value  09/81/1914 2.0   Calcium (mg/dL)  Date Value  78/29/5621 7.6 (L)   Calcium, Total (mg/dL)  Date Value  30/86/5784 8.5   Albumin (g/dL)  Date Value  69/62/9528 3.5  02/04/2015 3.5   Phosphorus (mg/dL)  Date Value  41/32/4401 1.4 (L)   Sodium (mmol/L)  Date Value  03/19/2024 138  02/04/2015 141    Assessment  [name] [name] is a 89 y.o. male presenting with a small bowel obstruction. PMH significant for severe PAD, systolic CHF secondary to ischemic cardiomyopathy, EF 35 to 40% 08/2023, CAD s/p CABG times 03/16/1998, and HTN. Pharmacy has been consulted to monitor and replace electrolytes.  Diet: NPO, sips w/ meds MIVF: D5/0.45 NS plus KCl 20 @ 100 mL/hr  Goal of Therapy: Electrolytes WNL  Plan:  K = 3.6 , Phos = 1.4, Will order Potassium phosphate 20 mmol IV x 1 (Note that IV fluid with KCL is running also) Will recheck phos at 2000 Check BMP, Mg, Phos with AM labs  Thank you for allowing pharmacy to be a part of this patient's care.  Havoc Sanluis A, PharmD 03/19/2024 7:27 AM

## 2024-03-19 NOTE — Consult Note (Signed)
 PHARMACY - ANTICOAGULATION CONSULT NOTE  Pharmacy Consult for heparin infusion Indication: atrial fibrillation  Allergies  Allergen Reactions   Cephalexin Rash   Spironolactone Nausea Only   Codeine Rash and Other (See Comments)    Can not sleep   Doxycycline Rash    Patient Measurements: Height: 5\' 7"  (170.2 cm) Weight: 69.4 kg (153 lb) IBW/kg (Calculated) : 66.1 Heparin Dosing Weight: 62 kg  Vital Signs: Temp: 98.1 F (36.7 C) (03/22 2012) Temp Source: Oral (03/22 2012) BP: 119/75 (03/22 2012)  Labs: Recent Labs    03/17/24 0501 03/17/24 0830 03/17/24 0830 03/17/24 1640 03/18/24 0118 03/18/24 1024 03/19/24 0435 03/19/24 1436 03/19/24 2136  HGB 7.5*  --   --   --  8.5*  --  7.7* 7.5*  --   HCT 23.2*  --   --   --  25.2*  --  22.7*  --   --   PLT 171  --   --   --  177  --  195  --   --   APTT  --  34  --  67* 68*  --   --   --   --   LABPROT  --  16.9*  --   --   --   --   --   --   --   INR  --  1.4*  --   --   --   --   --   --   --   HEPARINUNFRC  --  <0.10*   < > 0.31 0.27*   < > 0.28* 0.40 0.37  CREATININE 0.96  --   --   --  0.72  --  0.64  --   --    < > = values in this interval not displayed.    Estimated Creatinine Clearance: 55.1 mL/min (by C-G formula based on SCr of 0.64 mg/dL).   Medical History: Past Medical History:  Diagnosis Date   A-fib [facility])    a.) CHA2DS2VASc = 5 (age x 2, CHF, HTN, previous MI/vascular disease history);  b.) s/p DCCV 03/05/1993; c.) rate/rhythm maintained on oral metoprolol succinate; chronically anticoagulated with dose reduced apixaban   Aortic atherosclerosis (HCC)    B12 deficiency    CHF (congestive heart failure) (HCC)    a.) TTE 11/05/12: EF 25-35, dist sep AK, mild MR; b.) TTE 05/10/14: EF 30, mild LVH, mod BAE, mild AR/PR, mod MR/TR; c.) TTE 03/12/20: EF 20, sev glob HK, mod BAE, mild LAE, mod RAE, triv PR, mild AR, sev MR/TR; d.) TTE 08/21/21: EF 30, sev glob HK, apical dyskinesis, mild LVH, mod BAE, triv AR/PR,  mod MR, sev TR; e.) TTE 09/25/23: EF 35-40,  diff AK/HK, G2DD, sev BAE, RVE, RVSP 66.3, mild MR, AoV calc   Cor pulmonale (HCC)    Coronary artery disease 02/21/1993   a.) LHC 02/21/1993 : 95% pLAD, 75% OM1, 50% OM3, 25-50% pRCA, 50% large anterolateral lesion --> transfer [facility] for CABG; b.) s/p 2v CABG 02/26/1993 (LIMA-LAD, SVG-OM1)   Family history of adverse reaction to anesthesia    a.) PONV in 1st degree relative (daughter)   GERD (gastroesophageal reflux disease)    History of [place] spotted fever    HLD (hyperlipidemia)    HTN (hypertension), benign 04/25/2014   Hypothyroidism    Iron deficiency anemia    On apixaban therapy    Posterolateral myocardial infarction (HCC) 02/21/1993   LHC 02/21/1993 : 95% pLAD, 75% OM1,  50% OM3, 25-50% pRCA, 50% large anterolateral lesion --> transfer to [facility] for CABG; b.) s/p 2v CABG 02/26/1993 (LIMA-LAD, SVG-OM1)   Pulmonary HTN (HCC)    a.) TTE 05/10/2014: RVSP 48.6; b.) TTE 03/12/2020: RVSP 73.3; c.) TTE  09/25/2023: RVSP 66.3   PVD (peripheral vascular disease) (HCC)    S/P CABG x 2 02/26/1993   a.) LIMA-LAD, SVG-OM1 --> complicated by atrial flutter on POD4 --> Tx'd with digoxin + procainamide with no resolution. POD5 diltiazem gtt added with no improvement. Ultimately required DCCV on 03/05/1993.   Squamous cell carcinoma of skin 11/18/2021   right ear and mandible, EDC   Squamous cell carcinoma of skin 03/18/2022   right mandible and ear/refer for Mohs/ Dr. Adriana Simas has referred pt to Dr. Nedra Hai in otolaryngology excised by Dr. Nedra Hai 05/03/22, Radiation with Dr. Rushie Chestnut   Type 2 diabetes mellitus with peripheral angiopathy (HCC) 11/10/2018    Medications:  Patient on Eliquis 2.5 mg po BID PTA  Assessment: 89 yo male presented to the ED with complaints of N/V and lower abdominal pain.  Patient admitted with SBO.  On 3/18 patient taken to OR for exploratory laparotomy and reduction of small bowel volvulus.  Patient has history of Afib on  Eliquis which has been held since admission.  Pharmacy consulted to initiate heparin infusion.  Baseline labs: aPTT 34s, INR 1.4, Hgb 7.5, PLT wnl  3/22 0435 HL 0.28   Subtherapeutic 3/22 1436 HL 0.40   therapeutic x1 3/22 2136 HL 0.37   therapeutic x2  Goal of Therapy:  Heparin level 0.3-0.7 units/ml Monitor platelets by anticoagulation protocol: Yes   Plan:   No boluses per surgery  Heparin level remains therapeutic  Monitor hgb closely Hgb 7.7>7.5 Continue heparin infusion at 1050 units/hr Monitor daily heparin levels while on heparin infusion Daily CBC while on heparin  Thank you for involving pharmacy in this patient's care.   Rockwell Alexandria, PharmD Clinical Pharmacist 03/19/2024 10:04 PM

## 2024-03-19 NOTE — TOC Progression Note (Signed)
 Transition of Care [facility]) - Progression Note    Patient Details  Name: [name] [name] MRN: [id number] Date of Birth: 07-Dec-1929  Transition of Care [facility]) CM/SW Contact  Rodney Langton, RN Phone Number: 03/19/2024, 1:35 PM  Clinical Narrative:     Spoke with daughter, [name], regarding current bed offers.  She would still prefer to wait to see if [facility] can take patient back prior to making a choice for SNF.  [name], active with home health for wound care 3 times a week, [name].  TOC team will continue to reach out to [facility] to confirm they are able to meet patient's needs.       Expected Discharge Plan and Services                                               Social Determinants of Health (SDOH) Interventions SDOH Screenings   Food Insecurity: No Food Insecurity (03/15/2024)  Housing: Low Risk  (03/15/2024)  Transportation Needs: No Transportation Needs (03/15/2024)  Utilities: Not At Risk (03/15/2024)  Depression (PHQ2-9): Low Risk  (02/09/2024)  Financial Resource Strain: Low Risk  (09/22/2023)   Received from [facility]  Social Connections: Moderately Isolated (03/15/2024)  Tobacco Use: Low Risk  (03/15/2024)    Readmission Risk Interventions     No data to display

## 2024-03-19 NOTE — Progress Notes (Signed)
 03/19/2024  Subjective: Patient is 4 Days Post-Op status post exploratory laparotomy with lysis of adhesions and reduction of small bowel volvulus.  No acute events overnight.  Patient's KUB today shows that the contrast from Gastrografin is within the colon.  No reported bowel movement.  Patient has dementia and is hard to read him.  Vital signs: Temp:  [98 F (36.7 C)] 98 F (36.7 C) (03/22 0734) Pulse Rate:  [86-90] 90 (03/22 0734) Resp:  [18] 18 (03/22 0734) BP: (116-120)/(62-69) 116/62 (03/22 0734) SpO2:  [92 %-97 %] 97 % (03/22 0734) Weight:  [69.4 kg] 69.4 kg (03/22 0500)   Intake/Output: 03/21 0701 - 03/22 0700 In: 804.9 [I.V.:804.9] Out: 250 [Urine:250] Last BM Date : 03/15/24  Physical Exam: Constitutional: No acute distress Abdomen: Soft, nondistended, appropriately tender to palpation.  Midline incision is healing well and is clean, dry, intact with staples.  Labs:  Recent Labs    03/18/24 0118 03/19/24 0435  WBC 13.0* 10.8*  HGB 8.5* 7.7*  HCT 25.2* 22.7*  PLT 177 195   Recent Labs    03/18/24 0118 03/19/24 0435  NA 140 138  K 2.9* 3.6  CL 108 110  CO2 24 23  GLUCOSE 87 101*  BUN 20 14  CREATININE 0.72 0.64  CALCIUM 7.6* 7.6*   Recent Labs    03/17/24 0830  LABPROT 16.9*  INR 1.4*    Imaging: DG Abd Portable 1V-Small Bowel Obstruction Protocol-initial, 8 hr delay Result Date: 03/18/2024 CLINICAL DATA:  8 hour small-bowel follow-up film EXAM: PORTABLE ABDOMEN - 1 VIEW COMPARISON:  Film from earlier in the same day. FINDINGS: Contrast material is again noted within the stomach. Multiple dilated loops of small bowel are noted. Contrast has passed into the proximal colon although some residual in the small bowel is noted. Changes are consistent with a partial small bowel obstruction. IMPRESSION: Contrast material is now noted within the proximal colon. Electronically Signed   By: Alcide Clever M.D.   On: 03/18/2024 20:50   DG Abd 2 Views Result Date:  03/18/2024 CLINICAL DATA:  Ileus. EXAM: ABDOMEN - 2 VIEW COMPARISON:  03/17/2024 FINDINGS: Midline skin staples again noted. Oral contrast is seen within moderately dilated small bowel loops in the mid and lower abdomen. A contrast collection in the right lower quadrant has a more rounded appearance, and may be located within the cecum, although this cannot be stated with certainty. Some bowel gas and stool is seen within colon which appears mildly distended. Right pleural effusion and right basilar atelectasis are also noted. IMPRESSION: Oral contrast in moderately dilated small bowel loops, and question of some oral contrast reaching the cecum. Recommend continued radiographic follow up in several hours. Right pleural effusion and right basilar atelectasis. Electronically Signed   By: Danae Orleans M.D.   On: 03/18/2024 16:55    Assessment/Plan: This is a 89 y.o. male s/p exploratory laparotomy with lysis of adhesion and reduction of small bowel volvulus.  - The patient's KUB today does show the Gastrografin contrast in the colon, although there is some still residual small bowel dilation.  There is currently no bowel movement recorded but the patient does not appear in any distress and his abdomen does not appear distended.  As such, we will start the patient on a clear liquid diet today. - Will continue reassessing with serial KUBs given that with his dementia will be more difficult to assess him clinically.   Howie Ill, MD [facility]

## 2024-03-19 NOTE — Progress Notes (Signed)
 PROGRESS NOTE    [name] [name]  ZOX:[id number] DOB: [birth year] DOA: 03/14/2024 PCP: [name], MD  Outpatient Specialists: cardiology, oncology    Brief Narrative:   From admission h and p  ndrew BREYSON [name] is a 89 y.o. male with medical history significant for Severe PAD s/p multiple revascularization most recently 01/22/2024 for critical limb ischemia, and on chronic anticoagulation with Eliquis, systolic CHF secondary to ischemic cardiomyopathy, EF 35 to 40% 08/2023, CAD s/p CABG times 03/16/1998, HTN, who is being admitted with a small bowel obstruction, after presenting with nausea vomiting lower abdominal pain that started around noon on 3/17.  He had no diarrhea, no blood in the stool, no fever or chills.   Assessment & Plan:   Principal Problem:   Small bowel obstruction (HCC) Active Problems:   Lactic acidosis   CAD with history of CABG x3 (coronary artery disease)   PVD (peripheral vascular disease) (HCC)   Chronic anticoagulation   History of revascularization procedure of lower extremity   Chronic heart failure with reduced ejection fraction (HFrEF, <= 40%) (HCC)   Ischemic cardiomyopathy   HTN (hypertension), benign   Acquired hypothyroidism   A-fib (HCC)   Type 2 diabetes mellitus with peripheral angiopathy (HCC)   Recurrent squamous cell carcinoma in situ (SCCIS) of skin of cheek   Protein-calorie malnutrition, severe  # SBO POD4 s/p ex lap with lysis of adhesion. Volvulus with hemorrhagic bowel encountered, no resection performed. No BM continues to note flatus. Had contrast in rectum with gastrograffin study.  -Diet advanced to clears -Consult RD to assess nutrition needs  # Delirium S/p surgery , mildly confused  - delirium precautions  # Acute blood loss anemia Hgb 7.5 this AM.  - monitor    Latest Ref Rng & Units 03/19/2024    2:36 PM 03/19/2024    4:35 AM 03/18/2024    1:18 AM  CBC  WBC 4.0 - 10.5 K/uL  10.8  13.0   Hemoglobin 13.0 - 17.0  g/dL 7.5  7.7  8.5   Hematocrit 39.0 - 52.0 %  22.7  25.2   Platelets 150 - 400 K/uL  195  177   Continue to monitor H/H   # SCC left neck - wound care RN recs placed 3/19  # Ulcer left heel Chronic, followed by wound care as outpt, daughter says slowly improving - wound care RN recs placed 3/19  # CAD Hx cabg x3, no chest pain - statin - asa/plavix as below  # Hypothyroid - cont home synthroid  # Bruise right flank Appears superficial, no internal bleeding seen on CT of chest  # HFrEF Appears euvolemic - holding home lasix, likely resume metoprolol in the AM     # Skin tear Right forearm, happened 3/21  - wound care  # A-fib Rate controlled currently - holding home metop given soft BPs - Continue IV heparin   # PAD S/p multiple vascular procedures most recently January of this year - statin and aspirin and anticoagulation as above  # T2DM Well controlled. Last A1c 5.1   CBG (last 3)  No results for input(s): "GLUCAP" in the last 72 hours.   # Debility Resides at [facility]. PT advises snf - TOC consulted  DVT prophylaxis: heparin IV Code Status: dnr/dni Family Communication: daughter wanda updated at bedside 3/21  Level of care: Telemetry Medical Status is: Inpatient Remains inpatient appropriate because: severity of illness    Consultants:  Gen surg  Procedures: Ex lap  Antimicrobials:  S/p zosyn    Subjective: Some flatus, no bowel movement.  Objective: Vitals:   03/18/24 1444 03/18/24 2001 03/19/24 0500 03/19/24 0734  BP: 119/69 120/62  116/62  Pulse: 86 86  90  Resp: 18 18  18   Temp: 98 F (36.7 C) 98 F (36.7 C)  98 F (36.7 C)  TempSrc:  Oral    SpO2: 94% 92%  97%  Weight:   69.4 kg   Height:        Intake/Output Summary (Last 24 hours) at 03/19/2024 1820 Last data filed at 03/19/2024 1731 Gross per 24 hour  Intake 2020.59 ml  Output --  Net 2020.59 ml   Filed Weights   03/17/24 0312 03/18/24 0500 03/19/24 0500   Weight: 63.5 kg 69.6 kg 69.4 kg    Physical Exam  Constitutional: In no distress.  Cardiovascular: Normal rate, regular rhythm. No lower extremity edema  Pulmonary: Non labored breathing on room air, no wheezing or rales.   Abdominal: Soft.  Mildly distended, nontender, incision midline, clean dry and intact Musculoskeletal: Normal range of motion.     Neurological: Alert and oriented to person. Mild confusion Non focal  Skin: Skin is warm and dry.   Data Reviewed: I have personally reviewed following labs and imaging studies  CBC: Recent Labs  Lab 03/15/24 0013 03/15/24 0823 03/16/24 0532 03/17/24 0501 03/18/24 0118 03/19/24 0435 03/19/24 1436  WBC 16.3* 18.3* 26.2* 17.3* 13.0* 10.8*  --   NEUTROABS 13.8*  --   --   --   --   --   --   HGB 11.0* 10.0* 8.5* 7.5* 8.5* 7.7* 7.5*  HCT 34.3* 30.8* 25.7* 23.2* 25.2* 22.7*  --   MCV 94.0 93.1 92.8 94.7 88.7 88.0  --   PLT 363 335 220 171 177 195  --    Basic Metabolic Panel: Recent Labs  Lab 03/15/24 0013 03/16/24 0532 03/17/24 0501 03/18/24 0118 03/19/24 0435  NA 133* 138 141 140 138  K 3.6 3.8 3.2* 2.9* 3.6  CL 98 108 109 108 110  CO2 24 25 23 24 23   GLUCOSE 174* 120* 88 87 101*  BUN 32* 38* 31* 20 14  CREATININE 1.15 1.24 0.96 0.72 0.64  CALCIUM 9.0 7.3* 7.6* 7.6* 7.6*  MG  --   --  1.9 2.0 2.0  PHOS  --   --  1.9* 2.0* 1.4*   GFR: Estimated Creatinine Clearance: 55.1 mL/min (by C-G formula based on SCr of 0.64 mg/dL). Liver Function Tests: Recent Labs  Lab 03/15/24 0013  AST 31  ALT 19  ALKPHOS 51  BILITOT 1.2  PROT 7.6  ALBUMIN 3.5   Recent Labs  Lab 03/15/24 0013  LIPASE 24   No results for input(s): "AMMONIA" in the last 168 hours. Coagulation Profile: Recent Labs  Lab 03/17/24 0830  INR 1.4*   Cardiac Enzymes: No results for input(s): "CKTOTAL", "CKMB", "CKMBINDEX", "TROPONINI" in the last 168 hours. BNP (last 3 results) No results for input(s): "PROBNP" in the last 8760  hours. HbA1C: No results for input(s): "HGBA1C" in the last 72 hours. CBG: Recent Labs  Lab 03/15/24 1337  GLUCAP 163*   Lipid Profile: No results for input(s): "CHOL", "HDL", "LDLCALC", "TRIG", "CHOLHDL", "LDLDIRECT" in the last 72 hours. Thyroid Function Tests: No results for input(s): "TSH", "T4TOTAL", "FREET4", "T3FREE", "THYROIDAB" in the last 72 hours. Anemia Panel: No results for input(s): "VITAMINB12", "FOLATE", "FERRITIN", "TIBC", "IRON", "RETICCTPCT" in the last 72  hours. Urine analysis:    Component Value Date/Time   COLORURINE YELLOW (A) 03/15/2024 0013   APPEARANCEUR CLEAR (A) 03/15/2024 0013   APPEARANCEUR Clear 11/06/2013 1304   LABSPEC 1.018 03/15/2024 0013   LABSPEC 1.014 11/06/2013 1304   PHURINE 5.0 03/15/2024 0013   GLUCOSEU NEGATIVE 03/15/2024 0013   GLUCOSEU Negative 11/06/2013 1304   HGBUR NEGATIVE 03/15/2024 0013   BILIRUBINUR NEGATIVE 03/15/2024 0013   BILIRUBINUR Negative 11/06/2013 1304   KETONESUR NEGATIVE 03/15/2024 0013   PROTEINUR NEGATIVE 03/15/2024 0013   NITRITE NEGATIVE 03/15/2024 0013   LEUKOCYTESUR NEGATIVE 03/15/2024 0013   LEUKOCYTESUR Negative 11/06/2013 1304   Sepsis Labs: @LABRCNTIP (procalcitonin:4,lacticidven:4)  ) Recent Results (from the past 240 hours)  Culture, blood (routine x 2)     Status: None (Preliminary result)   Collection Time: 03/15/24  2:09 AM   Specimen: BLOOD  Result Value Ref Range Status   Specimen Description BLOOD LEFT ARM  Final   Special Requests   Final    BOTTLES DRAWN AEROBIC AND ANAEROBIC Blood Culture results may not be optimal due to an inadequate volume of blood received in culture bottles   Culture   Final    NO GROWTH 4 DAYS Performed at [facility], [address], [place], [place] [address]    Report Status PENDING  Incomplete  Culture, blood (routine x 2)     Status: None (Preliminary result)   Collection Time: 03/15/24  4:05 AM   Specimen: BLOOD  Result Value Ref Range  Status   Specimen Description BLOOD RIGHT AC  Final   Special Requests   Final    BOTTLES DRAWN AEROBIC AND ANAEROBIC Blood Culture results may not be optimal due to an inadequate volume of blood received in culture bottles   Culture   Final    NO GROWTH 4 DAYS Performed at [facility], [address], [place], [place] [address]    Report Status PENDING  Incomplete         Radiology Studies: DG Abd 2 Views Result Date: 03/19/2024 CLINICAL DATA:  Small bowel obstruction EXAM: ABDOMEN - 2 VIEW COMPARISON:  03/18/2024 FINDINGS: Right pleural effusion.  Right basilar airspace opacity. Contrast medium in the colon. Small amount of contrast medium in small bowel loops. Mildly dilated small bowel loops up to 4 cm in diameter. On upright view, some of the small bowel loops right slightly differing vertical heights. IMPRESSION: 1. Mildly dilated small bowel loops up to 4 cm in diameter. On upright view, some of the small bowel loops right slightly differing vertical heights. This could reflect partial small bowel obstruction. 2. Contrast medium primarily in the colon. 3. Right pleural effusion and right basilar airspace opacity. Electronically Signed   By: Gaylyn Rong M.D.   On: 03/19/2024 12:42   DG Abd Portable 1V-Small Bowel Obstruction Protocol-initial, 8 hr delay Result Date: 03/18/2024 CLINICAL DATA:  8 hour small-bowel follow-up film EXAM: PORTABLE ABDOMEN - 1 VIEW COMPARISON:  Film from earlier in the same day. FINDINGS: Contrast material is again noted within the stomach. Multiple dilated loops of small bowel are noted. Contrast has passed into the proximal colon although some residual in the small bowel is noted. Changes are consistent with a partial small bowel obstruction. IMPRESSION: Contrast material is now noted within the proximal colon. Electronically Signed   By: Alcide Clever M.D.   On: 03/18/2024 20:50   DG Abd 2 Views Result Date: 03/18/2024 CLINICAL DATA:  Ileus.  EXAM: ABDOMEN -  2 VIEW COMPARISON:  03/17/2024 FINDINGS: Midline skin staples again noted. Oral contrast is seen within moderately dilated small bowel loops in the mid and lower abdomen. A contrast collection in the right lower quadrant has a more rounded appearance, and may be located within the cecum, although this cannot be stated with certainty. Some bowel gas and stool is seen within colon which appears mildly distended. Right pleural effusion and right basilar atelectasis are also noted. IMPRESSION: Oral contrast in moderately dilated small bowel loops, and question of some oral contrast reaching the cecum. Recommend continued radiographic follow up in several hours. Right pleural effusion and right basilar atelectasis. Electronically Signed   By: Danae Orleans M.D.   On: 03/18/2024 16:55         Scheduled Meds:  Chlorhexidine Gluconate Cloth  6 each Topical Daily   leptospermum manuka honey  1 Application Topical Daily   levothyroxine  112 mcg Oral Q0600   simvastatin  40 mg Oral QPM   thiamine (VITAMIN B1) injection  100 mg Intravenous Daily   Continuous Infusions:  heparin 1,050 Units/hr (03/19/24 8119)     LOS: 4 days     Marolyn Haller, MD [facility]   If 7PM-7AM, please contact night-coverage [email or web address] Password [email or web address] 03/19/2024, 6:20 PM

## 2024-03-19 NOTE — Plan of Care (Signed)

## 2024-03-19 NOTE — Consult Note (Signed)
 PHARMACY - ANTICOAGULATION CONSULT NOTE  Pharmacy Consult for heparin infusion Indication: atrial fibrillation  Allergies  Allergen Reactions   Cephalexin Rash   Spironolactone Nausea Only   Codeine Rash and Other (See Comments)    Can not sleep   Doxycycline Rash    Patient Measurements: Height: 5\' 7"  (170.2 cm) Weight: 69.4 kg (153 lb) IBW/kg (Calculated) : 66.1 Heparin Dosing Weight: 62 kg  Vital Signs: Temp: 98 F (36.7 C) (03/22 0734) BP: 116/62 (03/22 0734) Pulse Rate: 90 (03/22 0734)  Labs: Recent Labs    03/17/24 0501 03/17/24 0830 03/17/24 0830 03/17/24 1640 03/18/24 0118 03/18/24 1024 03/18/24 1829 03/19/24 0435 03/19/24 1436  HGB 7.5*  --   --   --  8.5*  --   --  7.7* 7.5*  HCT 23.2*  --   --   --  25.2*  --   --  22.7*  --   PLT 171  --   --   --  177  --   --  195  --   APTT  --  34  --  67* 68*  --   --   --   --   LABPROT  --  16.9*  --   --   --   --   --   --   --   INR  --  1.4*  --   --   --   --   --   --   --   HEPARINUNFRC  --  <0.10*   < > 0.31 0.27*   < > 0.35 0.28* 0.40  CREATININE 0.96  --   --   --  0.72  --   --  0.64  --    < > = values in this interval not displayed.    Estimated Creatinine Clearance: 55.1 mL/min (by C-G formula based on SCr of 0.64 mg/dL).   Medical History: Past Medical History:  Diagnosis Date   A-fib [facility])    a.) CHA2DS2VASc = 5 (age x 2, CHF, HTN, previous MI/vascular disease history);  b.) s/p DCCV 03/05/1993; c.) rate/rhythm maintained on oral metoprolol succinate; chronically anticoagulated with dose reduced apixaban   Aortic atherosclerosis (HCC)    B12 deficiency    CHF (congestive heart failure) (HCC)    a.) TTE 11/05/12: EF 25-35, dist sep AK, mild MR; b.) TTE 05/10/14: EF 30, mild LVH, mod BAE, mild AR/PR, mod MR/TR; c.) TTE 03/12/20: EF 20, sev glob HK, mod BAE, mild LAE, mod RAE, triv PR, mild AR, sev MR/TR; d.) TTE 08/21/21: EF 30, sev glob HK, apical dyskinesis, mild LVH, mod BAE, triv AR/PR, mod  MR, sev TR; e.) TTE 09/25/23: EF 35-40,  diff AK/HK, G2DD, sev BAE, RVE, RVSP 66.3, mild MR, AoV calc   Cor pulmonale (HCC)    Coronary artery disease 02/21/1993   a.) LHC 02/21/1993 : 95% pLAD, 75% OM1, 50% OM3, 25-50% pRCA, 50% large anterolateral lesion --> transfer to [facility] for CABG; b.) s/p 2v CABG 02/26/1993 (LIMA-LAD, SVG-OM1)   Family history of adverse reaction to anesthesia    a.) PONV in 1st degree relative (daughter)   GERD (gastroesophageal reflux disease)    History of Rocky [place] spotted fever    HLD (hyperlipidemia)    HTN (hypertension), benign 04/25/2014   Hypothyroidism    Iron deficiency anemia    On apixaban therapy    Posterolateral myocardial infarction (HCC) 02/21/1993   LHC 02/21/1993 : 95% pLAD, 75% OM1,  50% OM3, 25-50% pRCA, 50% large anterolateral lesion --> transfer to [facility] for CABG; b.) s/p 2v CABG 02/26/1993 (LIMA-LAD, SVG-OM1)   Pulmonary HTN (HCC)    a.) TTE 05/10/2014: RVSP 48.6; b.) TTE 03/12/2020: RVSP 73.3; c.) TTE  09/25/2023: RVSP 66.3   PVD (peripheral vascular disease) (HCC)    S/P CABG x 2 02/26/1993   a.) LIMA-LAD, SVG-OM1 --> complicated by atrial flutter on POD4 --> Tx'd with digoxin + procainamide with no resolution. POD5 diltiazem gtt added with no improvement. Ultimately required DCCV on 03/05/1993.   Squamous cell carcinoma of skin 11/18/2021   right ear and mandible, EDC   Squamous cell carcinoma of skin 03/18/2022   right mandible and ear/refer for Mohs/ Dr. Adriana Simas has referred pt to Dr. Nedra Hai in otolaryngology excised by Dr. Nedra Hai 05/03/22, Radiation with Dr. Rushie Chestnut   Type 2 diabetes mellitus with peripheral angiopathy (HCC) 11/10/2018    Medications:  Patient on Eliquis 2.5 mg po BID PTA  Assessment: 89 yo male presented to the ED with complaints of N/V and lower abdominal pain.  Patient admitted with SBO.  On 3/18 patient taken to OR for exploratory laparotomy and reduction of small bowel volvulus.  Patient has history of Afib on Eliquis  which has been held since admission.  Pharmacy consulted to initiate heparin infusion.  Baseline labs: aPTT 34s, INR 1.4, Hgb 7.5, PLT wnl  3/22 0435 HL 0.28   Subtherapeutic 3/22 1436 HL 0.40   therapeutic x1  Goal of Therapy:  Heparin level 0.3-0.7 units/ml Monitor platelets by anticoagulation protocol: Yes   Plan:   No boluses per surgery  3/22 1436 HL 0.40   therapeutic x1 Monitor hgb closely Hgb 7.7>7.5 Continue heparin infusion at 1050 units/hr Check confirmatory heparin level in 8 hrs  Daily CBC while on heparin  Bari Mantis PharmD Clinical Pharmacist 03/19/2024

## 2024-03-19 NOTE — Consult Note (Signed)
 PHARMACY - ANTICOAGULATION CONSULT NOTE  Pharmacy Consult for heparin infusion Indication: atrial fibrillation  Allergies  Allergen Reactions   Cephalexin Rash   Spironolactone Nausea Only   Codeine Rash and Other (See Comments)    Can not sleep   Doxycycline Rash    Patient Measurements: Height: 5\' 7"  (170.2 cm) Weight: 69.6 kg (153 lb 7 oz) IBW/kg (Calculated) : 66.1 Heparin Dosing Weight: 62 kg  Vital Signs: Temp: 98 F (36.7 C) (03/21 2001) Temp Source: Oral (03/21 2001) BP: 120/62 (03/21 2001) Pulse Rate: 86 (03/21 2001)  Labs: Recent Labs    03/17/24 0501 03/17/24 0830 03/17/24 0830 03/17/24 1640 03/18/24 0118 03/18/24 1024 03/18/24 1829 03/19/24 0435  HGB 7.5*  --   --   --  8.5*  --   --  7.7*  HCT 23.2*  --   --   --  25.2*  --   --  22.7*  PLT 171  --   --   --  177  --   --  195  APTT  --  34  --  67* 68*  --   --   --   LABPROT  --  16.9*  --   --   --   --   --   --   INR  --  1.4*  --   --   --   --   --   --   HEPARINUNFRC  --  <0.10*   < > 0.31 0.27* 0.36 0.35 0.28*  CREATININE 0.96  --   --   --  0.72  --   --  0.64   < > = values in this interval not displayed.    Estimated Creatinine Clearance: 55.1 mL/min (by C-G formula based on SCr of 0.64 mg/dL).   Medical History: Past Medical History:  Diagnosis Date   A-fib [facility])    a.) CHA2DS2VASc = 5 (age x 2, CHF, HTN, previous MI/vascular disease history);  b.) s/p DCCV 03/05/1993; c.) rate/rhythm maintained on oral metoprolol succinate; chronically anticoagulated with dose reduced apixaban   Aortic atherosclerosis (HCC)    B12 deficiency    CHF (congestive heart failure) (HCC)    a.) TTE 11/05/12: EF 25-35, dist sep AK, mild MR; b.) TTE 05/10/14: EF 30, mild LVH, mod BAE, mild AR/PR, mod MR/TR; c.) TTE 03/12/20: EF 20, sev glob HK, mod BAE, mild LAE, mod RAE, triv PR, mild AR, sev MR/TR; d.) TTE 08/21/21: EF 30, sev glob HK, apical dyskinesis, mild LVH, mod BAE, triv AR/PR, mod MR, sev TR; e.) TTE  09/25/23: EF 35-40,  diff AK/HK, G2DD, sev BAE, RVE, RVSP 66.3, mild MR, AoV calc   Cor pulmonale (HCC)    Coronary artery disease 02/21/1993   a.) LHC 02/21/1993 : 95% pLAD, 75% OM1, 50% OM3, 25-50% pRCA, 50% large anterolateral lesion --> transfer to [facility] for CABG; b.) s/p 2v CABG 02/26/1993 (LIMA-LAD, SVG-OM1)   Family history of adverse reaction to anesthesia    a.) PONV in 1st degree relative (daughter)   GERD (gastroesophageal reflux disease)    History of Rocky Mountain spotted fever    HLD (hyperlipidemia)    HTN (hypertension), benign 04/25/2014   Hypothyroidism    Iron deficiency anemia    On apixaban therapy    Posterolateral myocardial infarction (HCC) 02/21/1993   LHC 02/21/1993 : 95% pLAD, 75% OM1, 50% OM3, 25-50% pRCA, 50% large anterolateral lesion --> transfer to [facility] for CABG; b.) s/p 2v CABG  02/26/1993 (LIMA-LAD, SVG-OM1)   Pulmonary HTN (HCC)    a.) TTE 05/10/2014: RVSP 48.6; b.) TTE 03/12/2020: RVSP 73.3; c.) TTE  09/25/2023: RVSP 66.3   PVD (peripheral vascular disease) (HCC)    S/P CABG x 2 02/26/1993   a.) LIMA-LAD, SVG-OM1 --> complicated by atrial flutter on POD4 --> Tx'd with digoxin + procainamide with no resolution. POD5 diltiazem gtt added with no improvement. Ultimately required DCCV on 03/05/1993.   Squamous cell carcinoma of skin 11/18/2021   right ear and mandible, EDC   Squamous cell carcinoma of skin 03/18/2022   right mandible and ear/refer for Mohs/ Dr. Adriana Simas has referred pt to Dr. Nedra Hai in otolaryngology excised by Dr. Nedra Hai 05/03/22, Radiation with Dr. Rushie Chestnut   Type 2 diabetes mellitus with peripheral angiopathy (HCC) 11/10/2018    Medications:  Patient on Eliquis 2.5 mg po BID PTA  Assessment: 89 yo male presented to the ED with complaints of N/V and lower abdominal pain.  Patient admitted with SBO.  On 3/18 patient taken to OR for exploratory laparotomy and reduction of small bowel volvulus.  Patient has history of Afib on Eliquis which has been held  since admission.  Pharmacy consulted to initiate heparin infusion.  Baseline labs: aPTT 34s, INR 1.4, Hgb 7.5, PLT wnl  Goal of Therapy:  Heparin level 0.3-0.7 units/ml Monitor platelets by anticoagulation protocol: Yes   Plan:  heparin level subtherapeutic No boluses per surgery  Monitor hgb closely Increase heparin infusion at 1050 units/hr Recheck heparin level in 8 hrs after rate change Daily CBC while on heparin  Otelia Sergeant, PharmD, [facility] 03/19/2024 6:24 AM

## 2024-03-19 NOTE — Consult Note (Signed)
 PHARMACY CONSULT NOTE - ELECTROLYTES  Pharmacy Consult for Electrolyte Monitoring and Replacement   Recent Labs: Height: 5\' 7"  (170.2 cm) Weight: 69.4 kg (153 lb) IBW/kg (Calculated) : 66.1 Estimated Creatinine Clearance: 55.1 mL/min (by C-G formula based on SCr of 0.64 mg/dL).  Potassium (mmol/L)  Date Value  03/19/2024 3.7  02/04/2015 4.2   Magnesium (mg/dL)  Date Value  16/09/9603 2.0   Calcium (mg/dL)  Date Value  54/08/8118 7.6 (L)   Calcium, Total (mg/dL)  Date Value  14/78/2956 8.5   Albumin (g/dL)  Date Value  21/30/8657 3.5  02/04/2015 3.5   Phosphorus (mg/dL)  Date Value  84/69/6295 1.9 (L)   Sodium (mmol/L)  Date Value  03/19/2024 138  02/04/2015 141    Assessment  [name] [name] is a 89 y.o. male presenting with a small bowel obstruction. PMH significant for severe PAD, systolic CHF secondary to ischemic cardiomyopathy, EF 35 to 40% 08/2023, CAD s/p CABG times 03/16/1998, and HTN. Pharmacy has been consulted to monitor and replace electrolytes.  Diet: NPO, sips w/ meds MIVF: D5/0.45 NS plus KCl 20 @ 100 mL/hr  Goal of Therapy: Electrolytes WNL  Plan:  K = 3.7 , Phos = 1.9, Will order Potassium phosphate 20 mmol IV x 1 Check BMP, Mg, Phos with AM labs  Thank you for allowing pharmacy to be a part of this patient's care.  Otelia Sergeant, PharmD, MBA 03/19/2024 11:11 PM

## 2024-03-19 NOTE — Hospital Course (Signed)
 Passed some gas. No BM recorded.   Tolerating clear liquid diet. No emesis or nausea. Feels like he has to have BM but no BM yet.   CLD, gG in colon.    Mild distended NT. Incision midline c/d/I   A&O to self and place but not time.     89 y.o. male with medical history significant for Severe PAD s/p multiple revascularization most recently 01/22/2024 for critical limb ischemia, and on chronic anticoagulation with Eliquis, systolic CHF secondary to ischemic cardiomyopathy, EF 35 to 40% 08/2023, CAD s/p CABG times 03/16/1998, HTN, who is being admitted with a small bowel obstruction, after presenting with nausea vomiting lower abdominal pain that started around noon.   SBO volvulus of SB LOA. Did note hemorrhagic bowel.

## 2024-03-20 ENCOUNTER — Other Ambulatory Visit: Payer: Self-pay

## 2024-03-20 ENCOUNTER — Inpatient Hospital Stay

## 2024-03-20 DIAGNOSIS — E8729 Other acidosis: Secondary | ICD-10-CM

## 2024-03-20 DIAGNOSIS — R571 Hypovolemic shock: Secondary | ICD-10-CM | POA: Diagnosis not present

## 2024-03-20 DIAGNOSIS — D62 Acute posthemorrhagic anemia: Secondary | ICD-10-CM

## 2024-03-20 DIAGNOSIS — E43 Unspecified severe protein-calorie malnutrition: Secondary | ICD-10-CM

## 2024-03-20 DIAGNOSIS — E039 Hypothyroidism, unspecified: Secondary | ICD-10-CM

## 2024-03-20 DIAGNOSIS — K56609 Unspecified intestinal obstruction, unspecified as to partial versus complete obstruction: Secondary | ICD-10-CM | POA: Diagnosis not present

## 2024-03-20 DIAGNOSIS — D72829 Elevated white blood cell count, unspecified: Secondary | ICD-10-CM

## 2024-03-20 DIAGNOSIS — E876 Hypokalemia: Secondary | ICD-10-CM

## 2024-03-20 DIAGNOSIS — E119 Type 2 diabetes mellitus without complications: Secondary | ICD-10-CM

## 2024-03-20 LAB — BASIC METABOLIC PANEL
Anion gap: 6 (ref 5–15)
BUN: 10 mg/dL (ref 8–23)
CO2: 22 mmol/L (ref 22–32)
Calcium: 7.2 mg/dL — ABNORMAL LOW (ref 8.9–10.3)
Chloride: 107 mmol/L (ref 98–111)
Creatinine, Ser: 0.61 mg/dL (ref 0.61–1.24)
GFR, Estimated: >60 mL/min (ref >60)
Glucose, Bld: 103 mg/dL — ABNORMAL HIGH (ref 70–99)
Potassium: 3.8 mmol/L (ref 3.5–5.1)
Sodium: 135 mmol/L (ref 135–145)

## 2024-03-20 LAB — CULTURE, BLOOD (ROUTINE X 2)
Culture: NO GROWTH
Culture: NO GROWTH

## 2024-03-20 LAB — CBC
HCT: 21.3 % — ABNORMAL LOW (ref 39.0–52.0)
Hemoglobin: 6.9 g/dL — ABNORMAL LOW (ref 13.0–17.0)
MCH: 29.4 pg (ref 26.0–34.0)
MCHC: 32.4 g/dL (ref 30.0–36.0)
MCV: 90.6 fL (ref 80.0–100.0)
Platelets: 186 10*3/uL (ref 150–400)
RBC: 2.35 MIL/uL — ABNORMAL LOW (ref 4.22–5.81)
RDW: 22.3 % — ABNORMAL HIGH (ref 11.5–15.5)
WBC: 11.4 10*3/uL — ABNORMAL HIGH (ref 4.0–10.5)
nRBC: 0.3 % — ABNORMAL HIGH (ref 0.0–0.2)

## 2024-03-20 LAB — PREPARE RBC (CROSSMATCH)

## 2024-03-20 LAB — MAGNESIUM: Magnesium: 2.1 mg/dL (ref 1.7–2.4)

## 2024-03-20 LAB — MRSA NEXT GEN BY PCR, NASAL: MRSA by PCR Next Gen: NOT DETECTED

## 2024-03-20 LAB — HEMOGLOBIN AND HEMATOCRIT, BLOOD
HCT: 20.6 % — ABNORMAL LOW (ref 39.0–52.0)
Hemoglobin: 6.8 g/dL — ABNORMAL LOW (ref 13.0–17.0)

## 2024-03-20 LAB — LACTIC ACID, PLASMA
Lactic Acid, Venous: 6.6 mmol/L (ref 0.5–1.9)
Lactic Acid, Venous: 7.1 mmol/L (ref 0.5–1.9)

## 2024-03-20 LAB — PHOSPHORUS: Phosphorus: 2.8 mg/dL (ref 2.5–4.6)

## 2024-03-20 LAB — GLUCOSE, CAPILLARY: Glucose-Capillary: 176 mg/dL — ABNORMAL HIGH (ref 70–99)

## 2024-03-20 LAB — HEPARIN LEVEL (UNFRACTIONATED): Heparin Unfractionated: 0.48 [IU]/mL (ref 0.30–0.70)

## 2024-03-20 MED ORDER — SODIUM CHLORIDE 0.9% IV SOLUTION: Freq: Once | INTRAVENOUS | Status: AC

## 2024-03-20 MED ORDER — IOHEXOL 300 MG/ML  SOLN: 80.0000 mL | Freq: Once | INTRAMUSCULAR | Status: DC | PRN

## 2024-03-20 MED ORDER — VASOPRESSIN 20 UNITS/100 ML INFUSION FOR SHOCK
0.0000 [IU]/min | INTRAVENOUS | Status: DC
  Administered 2024-03-20 – 2024-03-21 (×2): 0.03 [IU]/min via INTRAVENOUS
  Filled 2024-03-20 (×3): qty 100

## 2024-03-20 MED ORDER — METOPROLOL SUCCINATE ER 25 MG PO TB24
25.0000 mg | ORAL_TABLET | Freq: Every day | ORAL | Status: DC
  Administered 2024-03-20: 25 mg via ORAL
  Filled 2024-03-20: qty 1

## 2024-03-20 MED ORDER — PANTOPRAZOLE SODIUM 40 MG IV SOLR
40.0000 mg | Freq: Every day | INTRAVENOUS | Status: DC
  Administered 2024-03-20 – 2024-03-22 (×3): 40 mg via INTRAVENOUS
  Filled 2024-03-20 (×5): qty 10

## 2024-03-20 MED ORDER — METOPROLOL SUCCINATE ER 25 MG PO TB24: 12.5000 mg | ORAL_TABLET | Freq: Every day | ORAL | Status: DC

## 2024-03-20 MED ORDER — PANTOPRAZOLE SODIUM 40 MG PO TBEC: 40.0000 mg | DELAYED_RELEASE_TABLET | Freq: Every day | ORAL | Status: DC

## 2024-03-20 MED ORDER — CALCIUM GLUCONATE-NACL 1-0.675 GM/50ML-% IV SOLN
1.0000 g | Freq: Once | INTRAVENOUS | Status: AC
  Administered 2024-03-20: 1000 mg via INTRAVENOUS
  Filled 2024-03-20: qty 50

## 2024-03-20 MED ORDER — CHLORHEXIDINE GLUCONATE CLOTH 2 % EX PADS
6.0000 | MEDICATED_PAD | Freq: Every day | CUTANEOUS | Status: DC
  Administered 2024-03-21 – 2024-03-30 (×9): 6 via TOPICAL

## 2024-03-20 MED ORDER — IOHEXOL 350 MG/ML SOLN
100.0000 mL | Freq: Once | INTRAVENOUS | Status: AC | PRN
  Administered 2024-03-20: 75 mL via INTRAVENOUS

## 2024-03-20 MED ORDER — LACTATED RINGERS IV BOLUS
500.0000 mL | Freq: Once | INTRAVENOUS | Status: AC
  Administered 2024-03-20: 500 mL via INTRAVENOUS

## 2024-03-20 MED ORDER — ENSURE ENLIVE PO LIQD
237.0000 mL | Freq: Two times a day (BID) | ORAL | Status: DC
  Administered 2024-03-20 – 2024-03-21 (×2): 237 mL via ORAL

## 2024-03-20 NOTE — Progress Notes (Signed)
 0715: Right heel dressing changed by nightshift RN this AM per report.   1532: Right neck and right forearm dressings changed as ordered. Pt tolerated well.

## 2024-03-20 NOTE — Progress Notes (Signed)
 03/20/2024  Subjective: Patient is 5 Days Post-Op.  No acute events overnight.  Patient reports that she is feeling well and denies any significant pain.  Reports that he is having flatus.  Tolerated clear liquid diet.  No bowel movement recorded.  Vital signs: Temp:  [97.6 F (36.4 C)-98.1 F (36.7 C)] 97.9 F (36.6 C) (03/23 1540) Pulse Rate:  [68-99] 68 (03/23 1735) Resp:  [17-24] 24 (03/23 1735) BP: (96-119)/(58-94) 114/94 (03/23 1735) SpO2:  [86 %-98 %] 94 % (03/23 1740) Weight:  [72.3 kg] 72.3 kg (03/23 0407)   Intake/Output: 03/22 0701 - 03/23 0700 In: 1293.5 [I.V.:786.7; IV Piggyback:506.7] Out: -  Last BM Date : 03/15/24  Physical Exam: Constitutional: No acute distress Abdomen: Soft, nondistended, nontender to palpation.  Midline incision is healing well and is clean, dry, intact with staples.  Labs:  Recent Labs    03/19/24 0435 03/19/24 1436 03/20/24 0522 03/20/24 1737  WBC 10.8*  --  11.4*  --   HGB 7.7*   < > 6.9* 6.8*  HCT 22.7*  --  21.3* 20.6*  PLT 195  --  186  --    < > = values in this interval not displayed.   Recent Labs    03/19/24 0435 03/19/24 2136 03/20/24 0522  NA 138  --  135  K 3.6 3.7 3.8  CL 110  --  107  CO2 23  --  22  GLUCOSE 101*  --  103*  BUN 14  --  10  CREATININE 0.64  --  0.61  CALCIUM 7.6*  --  7.2*   No results for input(s): "LABPROT", "INR" in the last 72 hours.  Imaging: No results found.  Assessment/Plan: This is a 89 y.o. male s/p exploratory laparotomy with reduction of internal volvulus.  - The patient clinically appears well and his abdomen is nondistended and he reports that he is tolerating a clear liquid diet.  There is no bowel movement recorded. - Will slowly advance him to a full liquid diet this morning and continue this for today until we know better how his bowel function is doing.  Given his dementia, this is a little bit tricky.  Clinically at least, he is doing well and is okay to advance today  to full liquids.   Howie Ill, MD [facility]

## 2024-03-20 NOTE — Progress Notes (Signed)
 Mobility Specialist - Progress Note    03/20/24 1124  Mobility  Activity Stood at bedside;Dangled on edge of bed  Level of Assistance Maximum assist, patient does 25-49%  Assistive Device [employer]/Exercises Active  Activity Response Tolerated well  Mobility Referral Yes  Mobility visit 1 Mobility  Mobility Specialist Start Time (ACUTE ONLY) 1100  Mobility Specialist Stop Time (ACUTE ONLY) 1127  Mobility Specialist Time Calculation (min) (ACUTE ONLY) 27 min   Pt resting in bed upon entry on RA. Pt required Mod/MaxA of 2 to perform bed mobility and stand to Sara/Sabina Lift x5 times during hygiene clean. Pt had large BM during session. Pt endorsed persistent pain in left foot (heal and ankle) and right rib cage (still purple and discolored). Pt returned to laying position and endorses nausea due to heavy movement. RN notified. Pt returned to comfortable laying in bed and left with needs in reach bed alarm activated.   Johnathan Hausen Mobility Specialist 03/20/24, 11:47 AM

## 2024-03-20 NOTE — Plan of Care (Signed)
  Problem: Pain Managment: Goal: General experience of comfort will improve and/or be controlled Outcome: Progressing   Problem: Safety: Goal: Ability to remain free from injury will improve Outcome: Progressing   Problem: Skin Integrity: Goal: Risk for impaired skin integrity will decrease Outcome: Progressing

## 2024-03-20 NOTE — Consult Note (Signed)
 PHARMACY - ANTICOAGULATION CONSULT NOTE  Pharmacy Consult for heparin infusion Indication: atrial fibrillation  Allergies  Allergen Reactions   Cephalexin Rash   Spironolactone Nausea Only   Codeine Rash and Other (See Comments)    Can not sleep   Doxycycline Rash    Patient Measurements: Height: 5\' 7"  (170.2 cm) Weight: 72.3 kg (159 lb 6.3 oz) IBW/kg (Calculated) : 66.1 Heparin Dosing Weight: 62 kg  Vital Signs: Temp: 97.6 F (36.4 C) (03/23 0809) Temp Source: Oral (03/23 0809) BP: 116/68 (03/23 0809) Pulse Rate: 88 (03/23 0809)  Labs: Recent Labs    03/17/24 0830 03/17/24 1640 03/17/24 1640 03/18/24 0118 03/18/24 1024 03/19/24 0435 03/19/24 1436 03/19/24 2136 03/20/24 0522  HGB  --   --    < > 8.5*  --  7.7* 7.5*  --  6.9*  HCT  --   --   --  25.2*  --  22.7*  --   --  21.3*  PLT  --   --   --  177  --  195  --   --  186  APTT 34 67*  --  68*  --   --   --   --   --   LABPROT 16.9*  --   --   --   --   --   --   --   --   INR 1.4*  --   --   --   --   --   --   --   --   HEPARINUNFRC <0.10* 0.31  --  0.27*   < > 0.28* 0.40 0.37 0.48  CREATININE  --   --   --  0.72  --  0.64  --   --  0.61   < > = values in this interval not displayed.    Estimated Creatinine Clearance: 55.1 mL/min (by C-G formula based on SCr of 0.61 mg/dL).   Medical History: Past Medical History:  Diagnosis Date   A-fib [facility])    a.) CHA2DS2VASc = 5 (age x 2, CHF, HTN, previous MI/vascular disease history);  b.) s/p DCCV 03/05/1993; c.) rate/rhythm maintained on oral metoprolol succinate; chronically anticoagulated with dose reduced apixaban   Aortic atherosclerosis (HCC)    B12 deficiency    CHF (congestive heart failure) (HCC)    a.) TTE 11/05/12: EF 25-35, dist sep AK, mild MR; b.) TTE 05/10/14: EF 30, mild LVH, mod BAE, mild AR/PR, mod MR/TR; c.) TTE 03/12/20: EF 20, sev glob HK, mod BAE, mild LAE, mod RAE, triv PR, mild AR, sev MR/TR; d.) TTE 08/21/21: EF 30, sev glob HK, apical  dyskinesis, mild LVH, mod BAE, triv AR/PR, mod MR, sev TR; e.) TTE 09/25/23: EF 35-40,  diff AK/HK, G2DD, sev BAE, RVE, RVSP 66.3, mild MR, AoV calc   Cor pulmonale (HCC)    Coronary artery disease 02/21/1993   a.) LHC 02/21/1993 : 95% pLAD, 75% OM1, 50% OM3, 25-50% pRCA, 50% large anterolateral lesion --> transfer to [facility] for CABG; b.) s/p 2v CABG 02/26/1993 (LIMA-LAD, SVG-OM1)   Family history of adverse reaction to anesthesia    a.) PONV in 1st degree relative (daughter)   GERD (gastroesophageal reflux disease)    History of Rocky Mountain spotted fever    HLD (hyperlipidemia)    HTN (hypertension), benign 04/25/2014   Hypothyroidism    Iron deficiency anemia    On apixaban therapy    Posterolateral myocardial infarction (HCC) 02/21/1993   LHC 02/21/1993 :  95% pLAD, 75% OM1, 50% OM3, 25-50% pRCA, 50% large anterolateral lesion --> transfer to [facility] for CABG; b.) s/p 2v CABG 02/26/1993 (LIMA-LAD, SVG-OM1)   Pulmonary HTN (HCC)    a.) TTE 05/10/2014: RVSP 48.6; b.) TTE 03/12/2020: RVSP 73.3; c.) TTE  09/25/2023: RVSP 66.3   PVD (peripheral vascular disease) (HCC)    S/P CABG x 2 02/26/1993   a.) LIMA-LAD, SVG-OM1 --> complicated by atrial flutter on POD4 --> Tx'd with digoxin + procainamide with no resolution. POD5 diltiazem gtt added with no improvement. Ultimately required DCCV on 03/05/1993.   Squamous cell carcinoma of skin 11/18/2021   right ear and mandible, EDC   Squamous cell carcinoma of skin 03/18/2022   right mandible and ear/refer for Mohs/ Dr. Adriana Simas has referred pt to Dr. Nedra Hai in otolaryngology excised by Dr. Nedra Hai 05/03/22, Radiation with Dr. Rushie Chestnut   Type 2 diabetes mellitus with peripheral angiopathy (HCC) 11/10/2018    Medications:  Patient on Eliquis 2.5 mg po BID PTA  Assessment: 89 yo male presented to the ED with complaints of N/V and lower abdominal pain.  Patient admitted with SBO.  On 3/18 patient taken to OR for exploratory laparotomy and reduction of small bowel  volvulus.  Patient has history of Afib on Eliquis which has been held since admission.  Pharmacy consulted to initiate heparin infusion.  Baseline labs: aPTT 34s, INR 1.4, Hgb 7.5, PLT wnl  3/22 0435 HL 0.28   Subtherapeutic 3/22 1436 HL 0.40   therapeutic x1 3/22 2136 HL 0.37   therapeutic x2  3/22 0522 HL 0.48   therapeutic x3  Goal of Therapy:  Heparin level 0.3-0.7 units/ml Monitor platelets by anticoagulation protocol: Yes   Plan:   No boluses per surgery  Heparin level remains therapeutic  Monitor hgb closely Hgb 7.7>7.5>6.9 Continue heparin infusion at 1050 units/hr Monitor daily heparin levels while on heparin infusion Daily CBC while on heparin  Thank you for involving pharmacy in this patient's care.   Bari Mantis PharmD Clinical Pharmacist 03/20/2024

## 2024-03-20 NOTE — Plan of Care (Signed)
  Problem: Education: Goal: Knowledge of General Education information will improve Description: Including pain rating scale, medication(s)/side effects and non-pharmacologic comfort measures Outcome: Progressing   Problem: Clinical Measurements: Goal: Will remain free from infection Outcome: Progressing   Problem: Coping: Goal: Level of anxiety will decrease Outcome: Progressing   Problem: Elimination: Goal: Will not experience complications related to bowel motility Outcome: Progressing   

## 2024-03-20 NOTE — Significant Event (Signed)
 Rapid Response Event Note   Reason for Call : Hypotension    Initial Focused Assessment: On arrival to unit patient Is lying in bed in no acute distress. 2 RN's and NT at bedside. Patient is on tele monitor with HR 90s-low 100s- afib, BP 70/42 manual. Patient on 2 liters [place]. Patient is alert, no C/O pain, dizziness, lightheadedness, or nausea. States "I'm just cold". Abdomen distended but patient states no pain with palpation.       Interventions: MD notified via secure chat. Orders received to start a fluid bolus. 2 Units of PRBC ordered and CT scan. Patient transferred to SD unit. MD Montez Morita arrived during transport. GI team was contacted by Montez Morita.  Upon arrival to SD unit patient had medium sized bowel movement that was normal in color.    Plan of Care: Fluid bolus, 2 units PRBC, CT scan     Event Summary: As above   MD Notified: Montez Morita  Call [phone] Arrival [id number] End [id number]  Rosemary Holms, RN

## 2024-03-20 NOTE — Progress Notes (Signed)
 Bedside report given to [place], RN in ICU.

## 2024-03-20 NOTE — Progress Notes (Signed)
 Manual BP 70/42. Pt asymptomatic.   Marolyn Haller, MD notified. See new orders.   Katie RN rapid at bedside. Marolyn Haller, MD en route to bedside. Pt transferred to ICU room 9 with all belongings by this RN, rapid RN, and [place], NT.

## 2024-03-20 NOTE — Progress Notes (Signed)
 PROGRESS NOTE    [name] [name]  WUJ:[id number] DOB: 22-Oct-1929 DOA: 03/14/2024 PCP: [name], MD  Outpatient Specialists: cardiology, oncology    Brief Narrative:   From admission h and p  [name] [name] is a 89 y.o. male with medical history significant for Severe PAD s/p multiple revascularization most recently 01/22/2024 for critical limb ischemia, and on chronic anticoagulation with Eliquis, systolic CHF secondary to ischemic cardiomyopathy, EF 35 to 40% 08/2023, CAD s/p CABG times 03/16/1998, HTN, who is being admitted with a small bowel obstruction, after presenting with nausea vomiting lower abdominal pain that started around noon on 3/17.  He had no diarrhea, no blood in the stool, no fever or chills.   Assessment & Plan:   Principal Problem:   Small bowel obstruction (HCC) Active Problems:   Lactic acidosis   CAD with history of CABG x3 (coronary artery disease)   PVD (peripheral vascular disease) (HCC)   Chronic anticoagulation   History of revascularization procedure of lower extremity   Chronic heart failure with reduced ejection fraction (HFrEF, <= 40%) (HCC)   Ischemic cardiomyopathy   HTN (hypertension), benign   Acquired hypothyroidism   A-fib (HCC)   Type 2 diabetes mellitus with peripheral angiopathy (HCC)   Recurrent squamous cell carcinoma in situ (SCCIS) of skin of cheek   Protein-calorie malnutrition, severe  # SBO POD5 s/p ex lap with lysis of adhesion. Volvulus with hemorrhagic bowel encountered, no resection performed. Still no BM. Had contrast in rectum with gastrograffin study. Tolerating clears.  -Diet per general surgery  -F/u RD recommendations   # Delirium S/p surgery , mildly confused  - delirium precautions  # Acute blood loss anemia Hgb 6.9 this AM. No obvious bleeding noted.      Latest Ref Rng & Units 03/20/2024    5:22 AM 03/19/2024    2:36 PM 03/19/2024    4:35 AM  CBC  WBC 4.0 - 10.5 K/uL 11.4   10.8   Hemoglobin 13.0 -  17.0 g/dL 6.9  7.5  7.7   Hematocrit 39.0 - 52.0 % 21.3   22.7   Platelets 150 - 400 K/uL 186   195   Will transfuse 1u of PRBCs -CBC in the AM  -Remains on heparin for afib   # SCC left neck - wound care RN recs placed 3/19  # Ulcer left heel Chronic, followed by wound care as outpt, daughter says slowly improving - wound care RN recs placed 3/19  # CAD Hx cabg x3, no chest pain - statin - asa/plavix as below  # Hypothyroid - cont home synthroid  # HFrEF Appears euvolemic - holding home lasix, likely resume metoprolol in the AM     # Skin tear Right forearm, happened 3/21  - wound care  # A-fib Rates increasing.  -  metop initially held given soft Bps, will resume  this AM  - Continue IV heparin until definitively no plans for further surgical intervention    # PAD S/p multiple vascular procedures most recently January of this year - statin and aspirin and anticoagulation as above  # T2DM Well controlled. Last A1c 5.1   CBG (last 3)  No results for input(s): "GLUCAP" in the last 72 hours.  # R flank ecchymosis  Appears superficial, no internal bleeding seen on CT of chest  # Debility Resides at [facility]. PT advises snf - TOC consulted  DVT prophylaxis: heparin IV Code Status: dnr/dni Family Communication: daughter wanda  updated at bedside 3/21  Level of care: Telemetry Medical Status is: Inpatient Remains inpatient appropriate because: severity of illness    Consultants:  Gen surg  Procedures: Ex lap  Antimicrobials:  S/p zosyn    Subjective: Still no bowel movement but feels he is about to have a bowel movement. Able to drink clear liquids without any N/V.   Objective: Vitals:   03/19/24 2012 03/20/24 0317 03/20/24 0407 03/20/24 0809  BP: 119/75 114/67  116/68  Pulse:    88  Resp: 18 19  18   Temp: 98.1 F (36.7 C) 98 F (36.7 C)  97.6 F (36.4 C)  TempSrc: Oral Oral  Oral  SpO2: 97% 98%  98%  Weight:   72.3 kg   Height:         Intake/Output Summary (Last 24 hours) at 03/20/2024 1043 Last data filed at 03/20/2024 0957 Gross per 24 hour  Intake 1653.47 ml  Output --  Net 1653.47 ml   Filed Weights   03/18/24 0500 03/19/24 0500 03/20/24 0407  Weight: 69.6 kg 69.4 kg 72.3 kg    Physical Exam  Constitutional: In no distress. Thin.  Cardiovascular: irreg, irreg. No lower extremity edema  Pulmonary: Non labored breathing on room air, no wheezing or rales.   Abdominal: Soft. Mildly distended and non tender. Midline abdominal incision c/d/I staples in place  Musculoskeletal: Normal range of motion.     Neurological: Alert and oriented to person, place, not time. Non focal  Skin: Skin is warm and dry.   Data Reviewed: I have personally reviewed following labs and imaging studies  CBC: Recent Labs  Lab 03/15/24 0013 03/15/24 0823 03/16/24 0532 03/17/24 0501 03/18/24 0118 03/19/24 0435 03/19/24 1436 03/20/24 0522  WBC 16.3*   < > 26.2* 17.3* 13.0* 10.8*  --  11.4*  NEUTROABS 13.8*  --   --   --   --   --   --   --   HGB 11.0*   < > 8.5* 7.5* 8.5* 7.7* 7.5* 6.9*  HCT 34.3*   < > 25.7* 23.2* 25.2* 22.7*  --  21.3*  MCV 94.0   < > 92.8 94.7 88.7 88.0  --  90.6  PLT 363   < > 220 171 177 195  --  186   < > = values in this interval not displayed.   Basic Metabolic Panel: Recent Labs  Lab 03/16/24 0532 03/17/24 0501 03/18/24 0118 03/19/24 0435 03/19/24 2136 03/20/24 0522  NA 138 141 140 138  --  135  K 3.8 3.2* 2.9* 3.6 3.7 3.8  CL 108 109 108 110  --  107  CO2 25 23 24 23   --  22  GLUCOSE 120* 88 87 101*  --  103*  BUN 38* 31* 20 14  --  10  CREATININE 1.24 0.96 0.72 0.64  --  0.61  CALCIUM 7.3* 7.6* 7.6* 7.6*  --  7.2*  MG  --  1.9 2.0 2.0  --  2.1  PHOS  --  1.9* 2.0* 1.4* 1.9* 2.8   GFR: Estimated Creatinine Clearance: 55.1 mL/min (by C-G formula based on SCr of 0.61 mg/dL). Liver Function Tests: Recent Labs  Lab 03/15/24 0013  AST 31  ALT 19  ALKPHOS 51  BILITOT 1.2  PROT  7.6  ALBUMIN 3.5   Recent Labs  Lab 03/15/24 0013  LIPASE 24   No results for input(s): "AMMONIA" in the last 168 hours. Coagulation Profile: Recent Labs  Lab  03/17/24 0830  INR 1.4*   Cardiac Enzymes: No results for input(s): "CKTOTAL", "CKMB", "CKMBINDEX", "TROPONINI" in the last 168 hours. BNP (last 3 results) No results for input(s): "PROBNP" in the last 8760 hours. HbA1C: No results for input(s): "HGBA1C" in the last 72 hours. CBG: Recent Labs  Lab 03/15/24 1337  GLUCAP 163*   Lipid Profile: No results for input(s): "CHOL", "HDL", "LDLCALC", "TRIG", "CHOLHDL", "LDLDIRECT" in the last 72 hours. Thyroid Function Tests: No results for input(s): "TSH", "T4TOTAL", "FREET4", "T3FREE", "THYROIDAB" in the last 72 hours. Anemia Panel: No results for input(s): "VITAMINB12", "FOLATE", "FERRITIN", "TIBC", "IRON", "RETICCTPCT" in the last 72 hours. Urine analysis:    Component Value Date/Time   COLORURINE YELLOW (A) 03/15/2024 0013   APPEARANCEUR CLEAR (A) 03/15/2024 0013   APPEARANCEUR Clear 11/06/2013 1304   LABSPEC 1.018 03/15/2024 0013   LABSPEC 1.014 11/06/2013 1304   PHURINE 5.0 03/15/2024 0013   GLUCOSEU NEGATIVE 03/15/2024 0013   GLUCOSEU Negative 11/06/2013 1304   HGBUR NEGATIVE 03/15/2024 0013   BILIRUBINUR NEGATIVE 03/15/2024 0013   BILIRUBINUR Negative 11/06/2013 1304   KETONESUR NEGATIVE 03/15/2024 0013   PROTEINUR NEGATIVE 03/15/2024 0013   NITRITE NEGATIVE 03/15/2024 0013   LEUKOCYTESUR NEGATIVE 03/15/2024 0013   LEUKOCYTESUR Negative 11/06/2013 1304   Sepsis Labs: @LABRCNTIP (procalcitonin:4,lacticidven:4)  ) Recent Results (from the past 240 hours)  Culture, blood (routine x 2)     Status: None   Collection Time: 03/15/24  2:09 AM   Specimen: BLOOD  Result Value Ref Range Status   Specimen Description BLOOD LEFT ARM  Final   Special Requests   Final    BOTTLES DRAWN AEROBIC AND ANAEROBIC Blood Culture results may not be optimal due to an  inadequate volume of blood received in culture bottles   Culture   Final    NO GROWTH 5 DAYS Performed at [facility], [address] [place], [place] [address]    Report Status 03/20/2024 FINAL  Final  Culture, blood (routine x 2)     Status: None   Collection Time: 03/15/24  4:05 AM   Specimen: BLOOD  Result Value Ref Range Status   Specimen Description BLOOD RIGHT AC  Final   Special Requests   Final    BOTTLES DRAWN AEROBIC AND ANAEROBIC Blood Culture results may not be optimal due to an inadequate volume of blood received in culture bottles   Culture   Final    NO GROWTH 5 DAYS Performed at [facility], [address] [place], [place] [address]    Report Status 03/20/2024 FINAL  Final         Radiology Studies: DG Abd 2 Views Result Date: 03/19/2024 CLINICAL DATA:  Small bowel obstruction EXAM: ABDOMEN - 2 VIEW COMPARISON:  03/18/2024 FINDINGS: Right pleural effusion.  Right basilar airspace opacity. Contrast medium in the colon. Small amount of contrast medium in small bowel loops. Mildly dilated small bowel loops up to 4 cm in diameter. On upright view, some of the small bowel loops right slightly differing vertical heights. IMPRESSION: 1. Mildly dilated small bowel loops up to 4 cm in diameter. On upright view, some of the small bowel loops right slightly differing vertical heights. This could reflect partial small bowel obstruction. 2. Contrast medium primarily in the colon. 3. Right pleural effusion and right basilar airspace opacity. Electronically Signed   By: Gaylyn Rong M.D.   On: 03/19/2024 12:42   DG Abd Portable 1V-Small Bowel Obstruction Protocol-initial, 8 hr delay Result Date:  03/18/2024 CLINICAL DATA:  8 hour small-bowel follow-up film EXAM: PORTABLE ABDOMEN - 1 VIEW COMPARISON:  Film from earlier in the same day. FINDINGS: Contrast material is again noted within the stomach. Multiple dilated loops of small bowel are noted. Contrast has  passed into the proximal colon although some residual in the small bowel is noted. Changes are consistent with a partial small bowel obstruction. IMPRESSION: Contrast material is now noted within the proximal colon. Electronically Signed   By: Alcide Clever M.D.   On: 03/18/2024 20:50   DG Abd 2 Views Result Date: 03/18/2024 CLINICAL DATA:  Ileus. EXAM: ABDOMEN - 2 VIEW COMPARISON:  03/17/2024 FINDINGS: Midline skin staples again noted. Oral contrast is seen within moderately dilated small bowel loops in the mid and lower abdomen. A contrast collection in the right lower quadrant has a more rounded appearance, and may be located within the cecum, although this cannot be stated with certainty. Some bowel gas and stool is seen within colon which appears mildly distended. Right pleural effusion and right basilar atelectasis are also noted. IMPRESSION: Oral contrast in moderately dilated small bowel loops, and question of some oral contrast reaching the cecum. Recommend continued radiographic follow up in several hours. Right pleural effusion and right basilar atelectasis. Electronically Signed   By: Danae Orleans M.D.   On: 03/18/2024 16:55         Scheduled Meds:  sodium chloride   Intravenous Once   Chlorhexidine Gluconate Cloth  6 each Topical Daily   leptospermum manuka honey  1 Application Topical Daily   levothyroxine  112 mcg Oral Q0600   simvastatin  40 mg Oral QPM   thiamine (VITAMIN B1) injection  100 mg Intravenous Daily   Continuous Infusions:  calcium gluconate 1,000 mg (03/20/24 0957)   heparin 1,050 Units/hr (03/19/24 0981)     LOS: 5 days     Marolyn Haller, MD [facility]   If 7PM-7AM, please contact night-coverage [email or web address] Password [facility] 03/20/2024, 10:43 AM

## 2024-03-20 NOTE — Progress Notes (Signed)
 This RN called and spoke with [name] [name], daughter [place], that pt has been transferred to ICU room 9.

## 2024-03-20 NOTE — Consult Note (Incomplete)
 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  Pertinent Labs/Diagnostics Findings: Na+/ K+:133/3.6  Glucose:174 WBC:16.3 K/L   Hgb/Hct: 11/34.3 Lactic acid: 2.1~2.4~2.7~6.6~7.1 CT Chest/Abdomen/Pelvis which was concerning for SBO with mesenteric changes concerning for vascular congestion. 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  General surgery consulted 3/18:s/p  exploratory laparotomy with lysis of adhesion and reduction of small bowel volvulus.  Volvulus with hemorrhagic bowel incarcerated no resection performed. 3/19: Postop #1 3/20: Postop day 2 remains n.p.o.  Hemoglobin dropped 7.5 s/p 1 unit PRBC 3/21: Postop day 3 s/p Gastrografin 3/22: Postop day 4.  Diet advanced to clear 3/23: Postop day 5.  Rapid response called for hypotension.  Transferred to the ICU.  Hemoglobin dropped to 6.9 status post 2 units PRBC. repeat CT scan.  PCCM consulted  Consults:  Surgery IR PCCM  Procedures:  3/18:exploratory laparotomy and reduction of small bowel volvulus with hemorrhagic/edematous but viable bowel   Significant Diagnostic Tests:  3/23: CTA abdomen and pelvis> IMPRESSION: 1. New 11.7 x 10.9 x 18.2 cm left retroperitoneal hematoma beginning in the psoas muscle with fluid-sediment levels, and a small amount of additional free hemorrhage in the left paracolic gutter and posterior pelvis. 2. No contrast extravasation is seen into the stomach and small bowel. Contrast is in the colon through into the rectum and would obscure an active colonic bleed if present. 3. Aortic and coronary artery atherosclerosis. 4. 80% high-grade calcific stenosis of the celiac artery origin. 5. 50% calcific stenosis of the SMA. 6. High-grade calcific stenosis of both proximal renal arteries. 7. 60% calcific stenosis of the right common femoral artery. 8. Collapsed IVC which could be due to hypovolemia related to left retroperitoneal hemorrhage. 9. Moderate bilateral pleural effusions, new on the left and increased on the right. 10. Compressive collapse or consolidation in both lower lobes. 11. Cholelithiasis. 12. Cirrhotic liver with additionally again noted low-density ascites. 13. Persistent mild thickening in the small bowel wall in the mid and lower abdomen. This could be inflammatory, congestive or ischemic. No bowel wall pneumatosis or portal venous  gas. 14. There previously was small-bowel dilatation which is no longer  seen but there is still mesenteric vascular swirling in the right lower abdomen that could indicate mesenteric volvulus. 15. Trace air in the bladder. Correlate clinically for cystitis. Air in the bladder could be due to catheterization or fistula. 16. Osteopenia and degenerative change. Unchanged L1 compression fracture.  3/18: CT abdomen and pelvis> IMPRESSION: 1. Small-bowel obstruction with transition point in the anterior left abdomen. There is swirling of vessels in the central mesentery. The dilated bowel demonstrates mild wall thickening. Adjacent mesenteric stranding, interloop free fluid and vascular congestion. Developing small bowel ischemia from internal hernia or closed loop obstruction are not excluded. Surgical consult recommended. 2. Small right pleural effusion and compressive atelectasis in the right lower lobe. 3. Centrilobular micro nodules in the right middle lobe compatible with small airway infection/inflammation. 4. Compression fracture of T12 is increased compared to 07/24/2023. 5. Aortic Atherosclerosis (ICD10-I70.0).  Interim History / Subjective:      Micro Data:  3/18: Blood culture x2> 3/23: MRSA PCR>>   Antimicrobials:  Zosyn 3/18>>  OBJECTIVE  Blood pressure 104/61, pulse (!) 115, temperature (!) 96.1 F (35.6 C), temperature source Axillary, resp. rate (!) 24, height 5\' 7"  (1.702 m), weight 72.3 kg, SpO2 94%.      Intake/Output Summary (Last 24 hours) at 03/20/2024 2022 Last data filed at 03/20/2024 1600 Gross per 24 hour  Intake 1045 ml  Output 350 ml  Net 695 ml   Filed Weights   03/18/24 0500 03/19/24 0500 03/20/24 0407  Weight: 69.6 kg 69.4 kg 72.3 kg   Physical Examination  GENERAL: 89 year-old frail critically ill patient lying in the bed in no acute distress EYES: PEERLA. No scleral icterus. Extraocular muscles intact.  HEENT: Head atraumatic,  normocephalic. Oropharynx and nasopharynx clear.  NECK:  No JVD, supple  LUNGS: Decreased breath sounds bilaterally.  No use of accessory muscles of respiration.  CARDIOVASCULAR: S1, S2 normal. No murmurs, rubs, or gallops.  ABDOMEN: Soft, NTND EXTREMITIES: generalized dependent edema, Capillary refill < 3 seconds in all extremities. Pulses palpable distally. NEUROLOGIC: The patient is . No focal neurological deficit appreciated. Cranial nerves are intact.  SKIN: see below           Labs/imaging that I havepersonally reviewed  (right click and "Reselect all SmartList Selections" daily)     Labs   CBC: Recent Labs  Lab 03/15/24 0013 03/15/24 0823 03/16/24 0532 03/17/24 0501 03/18/24 0118 03/19/24 0435 03/19/24 1436 03/20/24 0522 03/20/24 1737  WBC 16.3*   < > 26.2* 17.3* 13.0* 10.8*  --  11.4*  --   NEUTROABS 13.8*  --   --   --   --   --   --   --   --   HGB 11.0*   < > 8.5* 7.5* 8.5* 7.7* 7.5* 6.9* 6.8*  HCT 34.3*   < > 25.7* 23.2* 25.2* 22.7*  --  21.3* 20.6*  MCV 94.0   < > 92.8 94.7 88.7 88.0  --  90.6  --   PLT 363   < > 220 171 177 195  --  186  --    < > = values in this interval not displayed.    Basic Metabolic Panel: Recent Labs  Lab 03/16/24 0532 03/17/24 0501 03/18/24 0118 03/19/24 0435 03/19/24 2136 03/20/24 0522  NA 138 141 140 138  --  135  K 3.8 3.2* 2.9* 3.6 3.7 3.8  CL 108 109 108 110  --  107  CO2 25 23 24 23   --  22  GLUCOSE 120* 88 87 101*  --  103*  BUN 38* 31* 20 14  --  10  CREATININE 1.24 0.96 0.72 0.64  --  0.61  CALCIUM 7.3* 7.6* 7.6* 7.6*  --  7.2*  MG  --  1.9 2.0 2.0  --  2.1  PHOS  --  1.9* 2.0* 1.4* 1.9* 2.8   GFR: Estimated Creatinine Clearance: 55.1 mL/min (by C-G formula based on SCr of 0.61 mg/dL). Recent Labs  Lab 03/15/24 0156 03/15/24 0405 03/15/24 0823 03/16/24 0532 03/17/24 0501 03/18/24 0118 03/19/24 0435 03/20/24 0522 03/20/24 1825  WBC  --   --  18.3*   < > 17.3* 13.0* 10.8* 11.4*  --    LATICACIDVEN 2.1* 2.4* 2.7*  --   --   --   --   --  6.6*   < > = values in this interval not displayed.    Liver Function Tests: Recent Labs  Lab 03/15/24 0013  AST 31  ALT 19  ALKPHOS 51  BILITOT 1.2  PROT 7.6  ALBUMIN 3.5   Recent Labs  Lab 03/15/24 0013  LIPASE 24   No results for input(s): "AMMONIA" in the last 168 hours.  ABG    Component Value Date/Time   PHART 7.369 01/22/2024 1603   PCO2ART 41.9 01/22/2024 1603   PO2ART 213 (H) 01/22/2024 1603   HCO3 24.2 01/22/2024 1603   TCO2 25 01/22/2024 1603   ACIDBASEDEF 1.0 01/22/2024 1603   O2SAT 100 01/22/2024 1603     Coagulation Profile: Recent Labs  Lab 03/17/24 0830  INR 1.4*    Cardiac Enzymes: No results for input(s): "CKTOTAL", "CKMB", "CKMBINDEX", "TROPONINI" in the last 168 hours.  HbA1C: No results found for: "HGBA1C"  CBG: Recent Labs  Lab 03/15/24 1337 03/20/24 1835  GLUCAP 163* 176*    Review of Systems:   Unable to be obtained secondary to the patient's altered mental status  Past Medical History  He,  has a past medical history of A-fib (HCC), Aortic atherosclerosis (HCC), B12 deficiency, CHF (congestive heart failure) (HCC), Cor pulmonale (HCC), Coronary artery disease (02/21/1993), Family history of adverse reaction to anesthesia, GERD (gastroesophageal reflux disease), History of [place] spotted fever, HLD (hyperlipidemia), HTN (hypertension), benign (04/25/2014), Hypothyroidism, Iron deficiency anemia, On apixaban therapy, Posterolateral myocardial infarction (HCC) (02/21/1993), Pulmonary HTN (HCC), PVD (peripheral vascular disease) (HCC), S/P CABG x 2 (02/26/1993), Squamous cell carcinoma of skin (11/18/2021), Squamous cell carcinoma of skin (03/18/2022), and Type 2 diabetes mellitus with peripheral angiopathy (HCC) (11/10/2018).   Surgical History    Past Surgical History:  Procedure Laterality Date   ANGIOPLASTY Left 01/22/2024   Procedure: BALLOON ANGIOPLASTY OF TIBIAL  PERONEAL TRUNK AND POSTERIOR TBIAL ARTERY AND DRUG COATED BALLOON ANGIOPLASTY OF POPLITEAL ARTERY;  Surgeon: Nada Libman, MD;  Location: [facility] OR;  Service: Vascular;  Laterality: Left;   cancer removal   2023   below the right base of ear at [facility]   COLONOSCOPY     CORONARY ARTERY BYPASS GRAFT N/A 02/26/1993   Procedure: CORONARY ARTERY BYPASS GRAFT; Location: [facility]; Surgeon: Marshell Garfinkel, MD   ENDARTERECTOMY FEMORAL Left 01/22/2024   Procedure: LEFT COMMON FEMORAL ENDARTERECTOMY WITH VEIN PATCH;  Surgeon: Nada Libman, MD;  Location: [facility] OR;  Service: Vascular;  Laterality: Left;   FEMORAL-FEMORAL BYPASS GRAFT Left 01/22/2024   Procedure: BYPASS GRAFT LEFT COMMON FEMORAL TO PROFUNDA  ARTERY USING DACRON GRAFT;  Surgeon: Nada Libman, MD;  Location: [facility] OR;  Service: Vascular;  Laterality: Left;   HERNIA REPAIR     LAPAROTOMY N/A 03/15/2024   Procedure: LAPAROTOMY, EXPLORATORY, REDUCTION OF VOLVULUS;  Surgeon: Kandis Cocking, MD;  Location: [facility] ORS;  Service: General;  Laterality: N/A;   LOWER EXTREMITY ANGIOGRAPHY Left 08/30/2021   Procedure: LOWER EXTREMITY ANGIOGRAPHY;  Surgeon: Renford Dills, MD;  Location: [facility] INVASIVE CV LAB;  Service: Cardiovascular;  Laterality: Left;   LOWER EXTREMITY ANGIOGRAPHY Left 11/10/2023   Procedure: Lower Extremity Angiography;  Surgeon: Renford Dills, MD;  Location: [facility] INVASIVE CV LAB;  Service: Cardiovascular;  Laterality: Left;     Social History   reports that he has never smoked. He has never used smokeless tobacco. He reports that he does not drink alcohol and does not use drugs.   Family History   His family history includes Colon cancer in his daughter; Diabetes in his brother; Esophageal cancer in his daughter; Heart attack in his brother; Hypertension in his mother; Stroke in his brother.   Allergies Allergies  Allergen Reactions   Cephalexin Rash   Spironolactone Nausea Only   Codeine Rash and Other (See Comments)     Can not sleep   Doxycycline Rash     Home Medications  Prior to Admission medications   Medication Sig Start Date End Date Taking? Authorizing Provider  acetaminophen (TYLENOL) 325 MG tablet Take 650 mg by mouth in the morning and at bedtime.   Yes [provider]  apixaban (ELIQUIS) 2.5 MG TABS tablet Take 1 tablet (2.5 mg total) by mouth 2 (two) times daily. 01/26/24  Yes Emilie Rutter, PA-C  aspirin EC 81 MG tablet Take 1 tablet (81 mg total) by mouth daily. Swallow whole. 01/26/24  Yes Emilie Rutter, PA-C  cyanocobalamin (VITAMIN B12) 1000 MCG tablet Take 1,000 mcg by mouth in the morning.   Yes [provider]  ferrous sulfate 325 (65 FE) MG EC tablet Take 325 mg by mouth 2 (two) times a week. Tuesday and Thursday in the morning   Yes [provider]  levothyroxine (SYNTHROID, LEVOTHROID) 112 MCG tablet Take 112 mcg by mouth daily. 03/28/14  Yes [provider]  metoprolol succinate (TOPROL-XL) 25 MG 24 hr tablet Take 25 mg by mouth daily. 10/21/23  Yes [provider]  ondansetron (ZOFRAN) 8 MG tablet Take 1 tablet (8 mg total) by mouth every 8 (eight) hours as needed for nausea or vomiting. 03/09/24  Yes Earna Coder, MD  predniSONE (DELTASONE) 10 MG tablet Take 10 mg by mouth daily with breakfast.   Yes [provider]  simvastatin (ZOCOR) 40 MG tablet Take 40 mg by mouth daily. 03/28/14  Yes [provider]  torsemide (DEMADEX) 10 MG tablet Take 10 mg by mouth in the morning.   Yes [provider]  Scheduled Meds:  sodium chloride   Intravenous Once   Chlorhexidine Gluconate Cloth  6 each Topical Q2200   feeding supplement  237 mL Oral BID BM   leptospermum manuka honey  1 Application Topical Daily   levothyroxine  112 mcg Oral Q0600   pantoprazole (PROTONIX) IV  40 mg Intravenous Daily   simvastatin  40 mg Oral QPM   thiamine (VITAMIN B1) injection  100 mg Intravenous Daily   Continuous  Infusions:  vasopressin 0.03 Units/min (03/21/24 0100)   PRN Meds:.morphine injection, ondansetron (ZOFRAN) IV    Active Hospital Problem list   See systems below  Assessment & Plan:  #Acute Blood Loss Anemia due to: #  Left Retroperitoneal Hemorrhage likely spontaneous 2/2 underlying bleeding diathesis (anticoagulation) #Coagulopathy -Monitor for S/Sx of bleeding -Trend CBC (H&H q6h) -SCD's for VTE Prophylaxis (chemical ppx contraindicated) -Transfuse for Hgb <8  (has received 4 units pRBC's and 1 FFP so far) -Discussed with general surgery on call Dr. Aleen Campi as well as IR on call Dr. Vilma Prader who both agree with conservative management at this time as above.  #Hemorrhagic shock #Mildly Elevated Troponin, suspect demand  #HFrEF, <= 40%), Ischemic cardiomyopathy #Atrial Fibrillation now with RVR PMHx:Afib on Eliquis, HFrEF, HTN, HLD,PAD s/p revacularization -Transfusions as indicated -Vasopressors as needed to maintain MAP goal >65 -Trend lactic acid until normalized -Trend HS Troponin until peaked -Hold Anticoagulation and antiplatelets -Hold diuretics -Hold GDMT -Consider Echocardiogram   #SBO POD5 s/p ex lap with lysis of adhesion. Volvulus with hemorrhagic bowel encountered, no resection performed.  Had BM this evening. Had contrast in rectum with gastrograffin study.  -Diet advanced to clears as tolerated -general surgery following  #Lactic Acidosis #Hypokalemia -Monitor I&O's / urinary output -Follow BMP -Replace electrolytes as indicated -rend Lactic   #Leukocytosis, suspect underlying sepsis due to intrabdominal source and ?Pneumonia -Monitor fever curve -Trend WBC's & Procalcitonin -Follow cultures as above -Continue empiric Zosyn ppx pending cultures & sensitivities  #Diabetes Mellitus Type 2 without complications -Recent HgbA1c 5.1 -CBG's q4; Target range of 140 to 180 -SSI -Follow ICU Hypo/Hyperglycemia protocol   #Hypothyroidism -Check  TSH -Continue synthroid  #Chronic ulcer of heel #Hypoalbuminemia  #Severe Malnutrition -Turn Q 2, utilize off loading devices -WOC consulted -Optimize nutrition, dietary following  #SCC of left neck with adenopathy in the right neck -s/p RT  -Follow with Dr. Donneta Romberg on [place]    Best practice:  Diet:  Oral Pain/Anxiety/Delirium protocol (if indicated): No VAP protocol (if indicated): Not indicated DVT prophylaxis: Contraindicated GI prophylaxis: PPI Glucose control:  SSI Yes Central venous access:  N/A Arterial line:  N/A Foley:  N/A Mobility:  bed rest  PT consulted: N/A Last date of multidisciplinary goals of care discussion [discussed plan of care with daughter over the phone and updated her] Code Status:  DNR Disposition: ICU   = Goals of Care = Code Status Order: DNR  Primary Emergency Contact: Cathren Laine, Home Phone: [phone] Patient  and family wishes to pursue ongoing treatment, but concurred that if deteriorated to pulselessness, patient would prefer a natural death as opposed to invasive measures such as CPR and intubation.    Critical care time: 45 minutes        Webb Silversmith DNP, CCRN, FNP-C, AGACNP-BC Acute Care & Family Nurse Practitioner [facility] PCCM on call pager [phone]

## 2024-03-20 NOTE — Progress Notes (Signed)
   03/20/24 1830  Assess: MEWS Score  BP (!) 70/42  Assess: MEWS Score  MEWS Temp 0  MEWS Systolic 2  MEWS Pulse 0  MEWS RR 1  MEWS LOC 0  MEWS Score 3  MEWS Score Color Yellow  Assess: if the MEWS score is Yellow or Red  Were vital signs accurate and taken at a resting state? Yes  Does the patient meet 2 or more of the SIRS criteria? No  MEWS guidelines implemented  Yes, yellow  Treat  MEWS Interventions Considered administering scheduled or prn medications/treatments as ordered  Take Vital Signs  Increase Vital Sign Frequency  Yellow: Q2hr x1, continue Q4hrs until patient remains green for 12hrs  Escalate  MEWS: Escalate Yellow: Discuss with charge nurse and consider notifying provider and/or RRT  Notify: Charge Nurse/RN  Name of Charge Nurse/RN Notified Rod, RN  Provider Notification  Provider Name/Title Marolyn Haller, MD  Date Provider Notified 03/20/24  Time Provider Notified [phone]  Method of Notification  (secure chat; Montez Morita, MD with prompt responses)  Notification Reason Change in status  Provider response En route  Date of Provider Response 03/20/24  Time of Provider Response 1830  Notify: Rapid Response  Name of Rapid Response RN Notified Katie, RN  Date Rapid Response Notified 03/20/24  Time Rapid Response Notified 1833

## 2024-03-20 NOTE — Consult Note (Signed)
 PHARMACY - ANTICOAGULATION CONSULT NOTE  Pharmacy Consult for heparin infusion Indication: atrial fibrillation  Allergies  Allergen Reactions   Cephalexin Rash   Spironolactone Nausea Only   Codeine Rash and Other (See Comments)    Can not sleep   Doxycycline Rash    Patient Measurements: Height: 5\' 7"  (170.2 cm) Weight: 72.3 kg (159 lb 6.3 oz) IBW/kg (Calculated) : 66.1 Heparin Dosing Weight: 62 kg  Vital Signs: Temp: 97.9 F (36.6 C) (03/23 1540) Temp Source: Oral (03/23 1540) BP: 96/82 (03/23 1540) Pulse Rate: 95 (03/23 1540)  Labs: Recent Labs    03/17/24 1640 03/17/24 1640 03/18/24 0118 03/18/24 1024 03/19/24 0435 03/19/24 1436 03/19/24 2136 03/20/24 0522  HGB  --    < > 8.5*  --  7.7* 7.5*  --  6.9*  HCT  --   --  25.2*  --  22.7*  --   --  21.3*  PLT  --   --  177  --  195  --   --  186  APTT 67*  --  68*  --   --   --   --   --   HEPARINUNFRC 0.31  --  0.27*   < > 0.28* 0.40 0.37 0.48  CREATININE  --   --  0.72  --  0.64  --   --  0.61   < > = values in this interval not displayed.    Estimated Creatinine Clearance: 55.1 mL/min (by C-G formula based on SCr of 0.61 mg/dL).   Medical History: Past Medical History:  Diagnosis Date   A-fib [facility])    a.) CHA2DS2VASc = 5 (age x 2, CHF, HTN, previous MI/vascular disease history);  b.) s/p DCCV 03/05/1993; c.) rate/rhythm maintained on oral metoprolol succinate; chronically anticoagulated with dose reduced apixaban   Aortic atherosclerosis (HCC)    B12 deficiency    CHF (congestive heart failure) (HCC)    a.) TTE 11/05/12: EF 25-35, dist sep AK, mild MR; b.) TTE 05/10/14: EF 30, mild LVH, mod BAE, mild AR/PR, mod MR/TR; c.) TTE 03/12/20: EF 20, sev glob HK, mod BAE, mild LAE, mod RAE, triv PR, mild AR, sev MR/TR; d.) TTE 08/21/21: EF 30, sev glob HK, apical dyskinesis, mild LVH, mod BAE, triv AR/PR, mod MR, sev TR; e.) TTE 09/25/23: EF 35-40,  diff AK/HK, G2DD, sev BAE, RVE, RVSP 66.3, mild MR, AoV calc   Cor  pulmonale (HCC)    Coronary artery disease 02/21/1993   a.) LHC 02/21/1993 : 95% pLAD, 75% OM1, 50% OM3, 25-50% pRCA, 50% large anterolateral lesion --> transfer to [facility] for CABG; b.) s/p 2v CABG 02/26/1993 (LIMA-LAD, SVG-OM1)   Family history of adverse reaction to anesthesia    a.) PONV in 1st degree relative (daughter)   GERD (gastroesophageal reflux disease)    History of Rocky Mountain spotted fever    HLD (hyperlipidemia)    HTN (hypertension), benign 04/25/2014   Hypothyroidism    Iron deficiency anemia    On apixaban therapy    Posterolateral myocardial infarction (HCC) 02/21/1993   LHC 02/21/1993 : 95% pLAD, 75% OM1, 50% OM3, 25-50% pRCA, 50% large anterolateral lesion --> transfer to [facility] for CABG; b.) s/p 2v CABG 02/26/1993 (LIMA-LAD, SVG-OM1)   Pulmonary HTN (HCC)    a.) TTE 05/10/2014: RVSP 48.6; b.) TTE 03/12/2020: RVSP 73.3; c.) TTE  09/25/2023: RVSP 66.3   PVD (peripheral vascular disease) (HCC)    S/P CABG x 2 02/26/1993   a.) LIMA-LAD, SVG-OM1 -->  complicated by atrial flutter on POD4 --> Tx'd with digoxin + procainamide with no resolution. POD5 diltiazem gtt added with no improvement. Ultimately required DCCV on 03/05/1993.   Squamous cell carcinoma of skin 11/18/2021   right ear and mandible, EDC   Squamous cell carcinoma of skin 03/18/2022   right mandible and ear/refer for Mohs/ Dr. Adriana Simas has referred pt to Dr. Nedra Hai in otolaryngology excised by Dr. Nedra Hai 05/03/22, Radiation with Dr. Rushie Chestnut   Type 2 diabetes mellitus with peripheral angiopathy (HCC) 11/10/2018    Medications:  Patient on Eliquis 2.5 mg po BID PTA  Assessment: 89 yo male presented to the ED with complaints of N/V and lower abdominal pain.  Patient admitted with SBO.  On 3/18 patient taken to OR for exploratory laparotomy and reduction of small bowel volvulus.  Patient has history of Afib on Eliquis which has been held since admission.  Pharmacy consulted to initiate heparin infusion.  Baseline labs:  aPTT 34s, INR 1.4, Hgb 7.5, PLT wnl  3/22 0435 HL 0.28   Subtherapeutic 3/22 1436 HL 0.40   therapeutic x1 3/22 2136 HL 0.37   therapeutic x2  3/22 0522 HL 0.48   therapeutic x3  RN called to report noted bleeding through wound dressing. Hgb trending down. Per MD, will reduce rate and recheck heparin level.   Goal of Therapy:  Heparin level 0.3-0.7 units/ml Monitor platelets by anticoagulation protocol: Yes   Plan:   No boluses per surgery  Heparin level remains therapeutic  Monitor hgb closely Hgb 7.7>7.5>6.9 Decrease heparin infusion to 950 units/hr Check heparin level in 8 hours Monitor daily heparin levels while on heparin infusion Daily CBC while on heparin  Thank you for involving pharmacy in this patient's care.   Rockwell Alexandria, PharmD Clinical Pharmacist 03/20/2024 4:35 PM

## 2024-03-20 NOTE — Progress Notes (Signed)
   03/20/24 1600  Urine Characteristics  Urine Color Amber  Urinary Interventions Intermittent/Straight cath  Intermittent/Straight Cath (mL) 350 mL  Intermittent Catheter Size 16  Hygiene Peri care

## 2024-03-20 NOTE — Consult Note (Signed)
 PHARMACY CONSULT NOTE - ELECTROLYTES  Pharmacy Consult for Electrolyte Monitoring and Replacement   Recent Labs: Height: 5\' 7"  (170.2 cm) Weight: 72.3 kg (159 lb 6.3 oz) IBW/kg (Calculated) : 66.1 Estimated Creatinine Clearance: 55.1 mL/min (by C-G formula based on SCr of 0.61 mg/dL).  Potassium (mmol/L)  Date Value  03/20/2024 3.8  02/04/2015 4.2   Magnesium (mg/dL)  Date Value  40/98/1191 2.1   Calcium (mg/dL)  Date Value  47/82/9562 7.2 (L)   Calcium, Total (mg/dL)  Date Value  13/07/6577 8.5   Albumin (g/dL)  Date Value  46/96/2952 3.5  02/04/2015 3.5   Phosphorus (mg/dL)  Date Value  84/13/2440 2.8   Sodium (mmol/L)  Date Value  03/20/2024 135  02/04/2015 141   Corrected Calcium: 7.6       (Ca 7.2,  albumin 3.5)   Assessment  [name] [name] is a 89 y.o. male presenting with a small bowel obstruction. PMH significant for severe PAD, systolic CHF secondary to ischemic cardiomyopathy, EF 35 to 40% 08/2023, CAD s/p CABG times 03/16/1998, and HTN. Pharmacy has been consulted to monitor and replace electrolytes.  Diet: CLD MIVF: none  Goal of Therapy: Electrolytes WNL  Plan:  Corrected Calcium 7.6  -  Will order Calcium 1 gm IV x1 Check electrolytes with AM labs  Thank you for allowing pharmacy to be a part of this patient's care.  Marisella Puccio A, PharmD 03/20/2024 8:01 AM

## 2024-03-20 NOTE — Progress Notes (Signed)
 Right forearm dressing bleeding and came through dressing. Dressing to right forearm changed. Marolyn Haller, MD and Rockwell [place], pharmacist notified. See new orders.

## 2024-03-21 ENCOUNTER — Encounter: Payer: Self-pay | Admitting: Internal Medicine

## 2024-03-21 ENCOUNTER — Telehealth: Payer: Self-pay | Admitting: *Deleted

## 2024-03-21 DIAGNOSIS — Z515 Encounter for palliative care: Secondary | ICD-10-CM

## 2024-03-21 DIAGNOSIS — R578 Other shock: Secondary | ICD-10-CM | POA: Diagnosis not present

## 2024-03-21 DIAGNOSIS — D62 Acute posthemorrhagic anemia: Secondary | ICD-10-CM | POA: Insufficient documentation

## 2024-03-21 DIAGNOSIS — K559 Vascular disorder of intestine, unspecified: Secondary | ICD-10-CM

## 2024-03-21 DIAGNOSIS — K56609 Unspecified intestinal obstruction, unspecified as to partial versus complete obstruction: Secondary | ICD-10-CM | POA: Diagnosis not present

## 2024-03-21 DIAGNOSIS — R1030 Lower abdominal pain, unspecified: Secondary | ICD-10-CM | POA: Diagnosis not present

## 2024-03-21 DIAGNOSIS — R112 Nausea with vomiting, unspecified: Secondary | ICD-10-CM | POA: Diagnosis not present

## 2024-03-21 DIAGNOSIS — K683 Retroperitoneal hematoma: Secondary | ICD-10-CM | POA: Diagnosis not present

## 2024-03-21 LAB — COMPREHENSIVE METABOLIC PANEL
ALT: 18 U/L (ref 0–44)
AST: 35 U/L (ref 15–41)
Albumin: 2.1 g/dL — ABNORMAL LOW (ref 3.5–5.0)
Alkaline Phosphatase: 38 U/L (ref 38–126)
Anion gap: 8 (ref 5–15)
BUN: 15 mg/dL (ref 8–23)
CO2: 20 mmol/L — ABNORMAL LOW (ref 22–32)
Calcium: 7.4 mg/dL — ABNORMAL LOW (ref 8.9–10.3)
Chloride: 106 mmol/L (ref 98–111)
Creatinine, Ser: 0.97 mg/dL (ref 0.61–1.24)
GFR, Estimated: >60 mL/min (ref >60)
Glucose, Bld: 187 mg/dL — ABNORMAL HIGH (ref 70–99)
Potassium: 4.8 mmol/L (ref 3.5–5.1)
Sodium: 134 mmol/L — ABNORMAL LOW (ref 135–145)
Total Bilirubin: 1.5 mg/dL — ABNORMAL HIGH (ref 0.0–1.2)
Total Protein: 4.6 g/dL — ABNORMAL LOW (ref 6.5–8.1)

## 2024-03-21 LAB — CBC WITH DIFFERENTIAL/PLATELET
Abs Immature Granulocytes: 1.27 10*3/uL — ABNORMAL HIGH (ref 0.00–0.07)
Basophils Absolute: 0.1 10*3/uL (ref 0.0–0.1)
Basophils Relative: 0 %
Eosinophils Absolute: 0 10*3/uL (ref 0.0–0.5)
Eosinophils Relative: 0 %
HCT: 22.5 % — ABNORMAL LOW (ref 39.0–52.0)
Hemoglobin: 8.1 g/dL — ABNORMAL LOW (ref 13.0–17.0)
Immature Granulocytes: 6 %
Lymphocytes Relative: 6 %
Lymphs Abs: 1.3 10*3/uL (ref 0.7–4.0)
MCH: 30.2 pg (ref 26.0–34.0)
MCHC: 36 g/dL (ref 30.0–36.0)
MCV: 84 fL (ref 80.0–100.0)
Monocytes Absolute: 2.2 10*3/uL — ABNORMAL HIGH (ref 0.1–1.0)
Monocytes Relative: 10 %
Neutro Abs: 16.5 10*3/uL — ABNORMAL HIGH (ref 1.7–7.7)
Neutrophils Relative %: 78 %
Platelets: 124 10*3/uL — ABNORMAL LOW (ref 150–400)
RBC: 2.68 MIL/uL — ABNORMAL LOW (ref 4.22–5.81)
RDW: 17.1 % — ABNORMAL HIGH (ref 11.5–15.5)
Smear Review: NORMAL
WBC: 21.3 10*3/uL — ABNORMAL HIGH (ref 4.0–10.5)
nRBC: 0.5 % — ABNORMAL HIGH (ref 0.0–0.2)

## 2024-03-21 LAB — PHOSPHORUS: Phosphorus: 4.4 mg/dL (ref 2.5–4.6)

## 2024-03-21 LAB — MAGNESIUM: Magnesium: 1.9 mg/dL (ref 1.7–2.4)

## 2024-03-21 LAB — LACTIC ACID, PLASMA: Lactic Acid, Venous: 2.3 mmol/L (ref 0.5–1.9)

## 2024-03-21 LAB — HEMOGLOBIN AND HEMATOCRIT, BLOOD
HCT: 21.8 % — ABNORMAL LOW (ref 39.0–52.0)
HCT: 20.3 % — ABNORMAL LOW (ref 39.0–52.0)
Hemoglobin: 7.9 g/dL — ABNORMAL LOW (ref 13.0–17.0)
Hemoglobin: 7.1 g/dL — ABNORMAL LOW (ref 13.0–17.0)

## 2024-03-21 LAB — GLUCOSE, CAPILLARY: Glucose-Capillary: 157 mg/dL — ABNORMAL HIGH (ref 70–99)

## 2024-03-21 MED ORDER — ADULT MULTIVITAMIN W/MINERALS CH
1.0000 | ORAL_TABLET | Freq: Every day | ORAL | Status: DC
  Administered 2024-03-22 – 2024-04-04 (×14): 1 via ORAL
  Filled 2024-03-21 (×14): qty 1

## 2024-03-21 MED ORDER — SODIUM CHLORIDE 0.9% IV SOLUTION: Freq: Once | INTRAVENOUS | Status: AC

## 2024-03-21 MED ORDER — BISACODYL 10 MG RE SUPP: 10.0000 mg | Freq: Once | RECTAL | Status: DC

## 2024-03-21 MED ORDER — FUROSEMIDE 10 MG/ML IJ SOLN
40.0000 mg | Freq: Once | INTRAMUSCULAR | Status: AC
  Administered 2024-03-21: 40 mg via INTRAVENOUS
  Filled 2024-03-21: qty 4

## 2024-03-21 MED ORDER — VITAMIN C 500 MG PO TABS
500.0000 mg | ORAL_TABLET | Freq: Two times a day (BID) | ORAL | Status: DC
  Administered 2024-03-21 – 2024-04-04 (×28): 500 mg via ORAL
  Filled 2024-03-21 (×28): qty 1

## 2024-03-21 MED ORDER — PROCHLORPERAZINE EDISYLATE 10 MG/2ML IJ SOLN
10.0000 mg | Freq: Once | INTRAMUSCULAR | Status: AC
  Administered 2024-03-21: 10 mg via INTRAVENOUS
  Filled 2024-03-21: qty 2

## 2024-03-21 MED ORDER — ENSURE ENLIVE PO LIQD
237.0000 mL | Freq: Three times a day (TID) | ORAL | Status: DC
  Administered 2024-03-21 – 2024-04-04 (×36): 237 mL via ORAL

## 2024-03-21 NOTE — Progress Notes (Signed)
 [facility] SURGICAL PROGRESS NOTE  Hospital Day(s): 6.   Post op day(s): 6 Days Post-Op.   Interval History:  Patient seen and examined Overnight, rapid was called secondary to hypotension and dizziness He did undergo CTA which was concerning for left sided retro-peritoneal hematoma, no extravasation to suggest active bleed Transfused 2 units pRBC; pending FFP He was transferred to step-down He is on 0.03 units vasopressin  This morning, he reports he is doing okay Abdomen is non-tender Remains distended but stable Hgb to 8.1 (from 6.8) WBC 21.3K Renal function normal; sCr - 0.97; UO - 350 ccs Venous lactate improving; 2.3 (from 7.1 at peak) He is on FLD Continues to have bowel function   Vital signs in last 24 hours: [min-max] current  Temp:  [96 F (35.6 C)-98 F (36.7 C)] 97 F (36.1 C) (03/24 0400) Pulse Rate:  [68-193] 110 (03/24 0645) Resp:  [13-29] 18 (03/24 0645) BP: (64-121)/(37-94) 83/74 (03/24 0645) SpO2:  [86 %-99 %] 93 % (03/24 0645) Weight:  [70.1 kg] 70.1 kg (03/24 0545)     Height: 5\' 7"  (170.2 cm) Weight: 70.1 kg BMI (Calculated): 24.2   Intake/Output last 2 shifts:  03/23 0701 - 03/24 0700 In: 2717.5 [P.O.:720; I.V.:324.5; Blood:1173; IV Piggyback:500] Out: 850 [Urine:350; Emesis/NG output:500]   Physical Exam:  Constitutional: alert, cooperative and no distress  Respiratory: breathing non-labored at rest; on [place] Cardiovascular: irregularly irregular, tachycardic Gastrointestinal: soft, distension is unchanged from previous exams, non-tender, no rebound/guarding Integumentary: Laparotomy is CDI with staples MSK: Significant ecchymosis to the LUE at site of PIV  Labs:     Latest Ref Rng & Units 03/21/2024    3:29 AM 03/20/2024    5:37 PM 03/20/2024    5:22 AM  CBC  WBC 4.0 - 10.5 K/uL 21.3   11.4   Hemoglobin 13.0 - 17.0 g/dL 8.1  6.8  6.9   Hematocrit 39.0 - 52.0 % 22.5  20.6  21.3   Platelets 150 - 400 K/uL 124   186        Latest Ref Rng & Units 03/21/2024    3:29 AM 03/20/2024    5:22 AM 03/19/2024    9:36 PM  CMP  Glucose 70 - 99 mg/dL 253  664    BUN 8 - 23 mg/dL 15  10    Creatinine 4.03 - 1.24 mg/dL 4.74  2.59    Sodium 563 - 145 mmol/L 134  135    Potassium 3.5 - 5.1 mmol/L 4.8  3.8  3.7   Chloride 98 - 111 mmol/L 106  107    CO2 22 - 32 mmol/L 20  22    Calcium 8.9 - 10.3 mg/dL 7.4  7.2    Total Protein 6.5 - 8.1 g/dL 4.6     Total Bilirubin 0.0 - 1.2 mg/dL 1.5     Alkaline Phos 38 - 126 U/L 38     AST 15 - 41 U/L 35     ALT 0 - 44 U/L 18        Imaging studies: No new pertinent imaging studies   Assessment/Plan: 89 y.o. male with retroperitoneal hematoma without active extravasation 6 Days Post-Op s/p exploratory laparotomy and reduction of small bowel volvulus with hemorrhagic/edematous but viable bowel, complicated by pertinent comorbidities including advanced age, deconditioning, significant cardiac history on Eliquis, and SCC currently on immunotherapy.    - Retroperitoneal hematoma may be spontaneous in setting of heparin need. There was no dissection or involvement of  this space intra-operatively during laparotomy on 03/18. Fortunately, there does not appear to be any evidence of extravasation to suggest active bleeding. He has responded to pRBCs and is awaiting FFP. For now, would recommend trending his H&H. If this continues to drop, may need IR or vascular surgery involvement for embolization.    - Okay for FLD - Monitor abdominal examination; on-going bowel function   - Monitor H&H - Pain control prn; antiemetics prn   - Okay to continue with therapies once certain Hgb stable  - Further management per primary service; we will follow   All of the above findings and recommendations were discussed with the patient, and the medical team, and all of patient's questions were answered to his expressed satisfaction.  -- Lynden Oxford, PA-C [facility] 03/21/2024, 7:08  AM M-F: 7am - 4pm

## 2024-03-21 NOTE — Telephone Encounter (Signed)
 She states that the pt. Suppose to get treatment Libtayo on 3/31 and wants a call back about changing the appts,she feels he will be good enough for the treatment, wants to get new schedule.and wants a call back when everything is settled.

## 2024-03-21 NOTE — Progress Notes (Signed)
 PROGRESS NOTE    [name]  GNF:[id number] DOB: March 20, 1929 DOA: 03/14/2024 PCP: [name], MD  Outpatient Specialists: cardiology, oncology    Brief Narrative:   From admission h and p  [name] [name] is a 89 y.o. male with medical history significant for Severe PAD s/p multiple revascularization most recently 01/22/2024 for critical limb ischemia, and on chronic anticoagulation with Eliquis, systolic CHF secondary to ischemic cardiomyopathy, EF 35 to 40% 08/2023, CAD s/p CABG times 03/16/1998, HTN, who is being admitted with a small bowel obstruction, after presenting with nausea vomiting lower abdominal pain that started around noon on 3/17.  He had no diarrhea, no blood in the stool, no fever or chills.   Assessment & Plan:   Principal Problem:   Small bowel obstruction (HCC) Active Problems:   Lactic acidosis   CAD with history of CABG x3 (coronary artery disease)   PVD (peripheral vascular disease) (HCC)   Chronic anticoagulation   History of revascularization procedure of lower extremity   Chronic heart failure with reduced ejection fraction (HFrEF, <= 40%) (HCC)   Ischemic cardiomyopathy   HTN (hypertension), benign   Acquired hypothyroidism   A-fib (HCC)   Type 2 diabetes mellitus with peripheral angiopathy (HCC)   Recurrent squamous cell carcinoma in situ (SCCIS) of skin of cheek   Protein-calorie malnutrition, severe   Nontraumatic retroperitoneal hematoma   ABLA (acute blood loss anemia)   Hemorrhagic shock (HCC)  # SBO s/p ex lap with lysis of adhesions 03/15/24.  Volvulus with hemorrhagic bowel encountered, no resection performed.  Bowel movement reported 3/23.  --On full liquid diet --Diet per general surgery  --Pain control PRN --Wound care  --F/u RD recommendations   # Delirium S/p surgery , mildly confused, intermittent hallucinations --delirium precautions  # Acute blood loss anemia # Hemorrhagic shock # Retroperitoneal hematoma  LEFT 3/24 -- pt decompensated evening of 3/23, CTA showed new LEFT retroperitoneal  hematoma.  Transfused 2 units RBC's (3 total yesterday)  --Trend Hbg --Stop heparin & avoid anticoagulation --Dialy CBC's --Monitor left flank for pain, swelling signs of ongoing bleeding  # SCC left neck --Wound care RN recs placed 3/19 --Follow up with Oncology  # Ulcer left heel Chronic, followed by wound care as outpt, daughter says slowly improving -_Wound care RN recs placed 3/19 --Offloading boots  # CAD Hx cabg x3, stable - no chest pain --Statin --Off ASA/Plavix due to bleeding  # Hypothyroid --Continue Synthroid  # Chronic HFrEF --Lasix and metoprolol on hold, requiring pressors  # Skin tear Right forearm, happened 3/21  --Appreciate wound care's recommendations --Wound care --Monitor for any signs of infection  # A-fib HR's up in setting of hemorrhagic shock --Hold metoprolol --Off heparin infusion for same --Telemetry --Replace electrolytes   # PAD S/p multiple vascular procedures most recently January of this year --Hold aspirin and anticoagulation for now --Continue statin  # T2DM Well controlled. Last A1c 5.1   # R flank ecchymosis  Appears superficial, no internal bleeding seen on CT of chest Daughter reports this due to a fall PTA. --Monitor  # Debility Resides at [facility]. PT advises snf --TOC consulted --Palliative care consulted   #Goals of care --Code status DNR/DNI --Palliative Care consulted Family agreeable to having more in depth talks about goals of care given patient's recent decline and severity of multiple acute issues   DVT prophylaxis: none - due to retroperitoneal hematoma and hemorrhagic shock  Code Status: dnr/dni  Family Communication:  daughter and son-in-law at bedside on rounds this AM 3/23   Level of care: ICU Status is: Inpatient Remains inpatient appropriate because: severity of illness    Consultants:  General  Surgery Palliative Care  Procedures: Ex lap  Antimicrobials:  S/p zosyn    Subjective:  Patient seen with family at bedside this AM. Yesterday evening, pt decompensated and was found to have a retroperitoneal hematoma, required two units RBC transfusion and remains hypotensive on vasopressin in ICU this AM.   Pt is drowsy, sleeping but wakes to voice, drifts in and out of sleep during the encounter. He denies pain or other complaints.  Denies left-sided flank or back pain. Daughter reports a fall prior to admission with persistent bruising on the right flank since then.  Objective: Vitals:   03/21/24 1430 03/21/24 1445 03/21/24 1500 03/21/24 1515  BP: (!) 97/48 (!) 97/53 (!) 98/54 (!) 95/49  Pulse: 94 (!) 105 93 95  Resp: (!) 21 (!) 21 (!) 21 18  Temp:   97.7 F (36.5 C)   TempSrc:   Axillary   SpO2: 99%  97% 96%  Weight:      Height:        Intake/Output Summary (Last 24 hours) at 03/21/2024 1618 Last data filed at 03/21/2024 1444 Gross per 24 hour  Intake 1718.41 ml  Output 900 ml  Net 818.41 ml   Filed Weights   03/19/24 0500 03/20/24 0407 03/21/24 0545  Weight: 69.4 kg 72.3 kg 70.1 kg    Physical Exam   General exam: awake, drowsy, no acute distress, frail HEENT: moist mucus membranes, hearing grossly normal  Respiratory system: CTAB, no wheezes, rales or rhonchi, normal respiratory effort. Cardiovascular system: normal S1/S2  irregularly irregular, no lower extremity edema.   Gastrointestinal system: midline surgical incision appears well without signs of bleeding or surrounding erythema swelling or other signs of infection Central nervous system: drowsy but oriented to self, place, situation. no gross focal neurologic deficits, normal speech Extremities: LUE edema, offloading boots on feet, no BLE edema Skin: pale appearing, dry, intact, normal temperature     Data Reviewed:   Notable labs --   Na 134 Bicarb 20 Glucose 187 Ca 7.4 Albumin  2.1 Tbili 1.5  WBC 21.3 Hbg 8.1 >> 7.9 after 2 units RBC's overnight Platelets 124k  Lactic acid peaked overnight 7.1 >> 2.3 in setting of hemorrhagic shock      Scheduled Meds:  vitamin C  500 mg Oral BID   Chlorhexidine Gluconate Cloth  6 each Topical Q2200   feeding supplement  237 mL Oral TID BM   leptospermum manuka honey  1 Application Topical Daily   levothyroxine  112 mcg Oral Q0600   [START ON 03/22/2024] multivitamin with minerals  1 tablet Oral Daily   pantoprazole (PROTONIX) IV  40 mg Intravenous Daily   simvastatin  40 mg Oral QPM   thiamine (VITAMIN B1) injection  100 mg Intravenous Daily   Continuous Infusions:  vasopressin Stopped (03/21/24 1338)     LOS: 6 days     Pennie Banter, DO [facility]   If 7PM-7AM, please contact night-coverage [email or web address] Password [facility] 03/21/2024, 4:18 PM

## 2024-03-21 NOTE — Consult Note (Signed)
 Consultation Note Date: 03/21/2024 at 1100  Patient Name: [name] [name]  DOB: 09-15-1929  MRN: [id number]  Age / Sex: 89 y.o., male  PCP: [name], MD Referring Physician: Pennie Banter, DO  HPI/Patient Profile: 89 y.o. male  with past medical history of severe PAD s/p multiple revascularizations (most recent-01/22/2024 for critical limb ischemia) on chronic anticoagulation-Eliquis, systolic CHF secondary to ischemic cardiomyopathy (EF 35 to 40% on 08/2023), CAD s/p CABG x 3 (1999), and HTN admitted on 03/14/2024 with nausea and vomiting with lower abdominal pain.  Patient has been diagnosed with SBO.  3/18, patient underwent ISIS of adhesion.  Volvulus with hemorrhagic bowel encountered at but no resection performed.  3/23, CT of abdomen and pelvis revealed retroperitoneal hematoma active extravasation. Patient given 2 units RBCs.  Evening of 3/23, patient became hypotensive but asymptomatic.  Patient transferred to ICU.   PMT was consulted to support patient and family with goals of care discussions.   Clinical Assessment and Goals of Care: Extensive chart review completed prior to meeting patient including labs, vital signs, imaging, progress notes, orders, and available advanced directive documents from current and previous encounters. I then met with patient at bedside.  Patient is asleep and does not easily awaken to my presence.   RN confirms that patient is alert to self and unable to engage in medical decision making appropriately.  He is unable to participate in goals of care medical decision making independently at this time.   After assessing the patient, I spoke with his daughter [name] [name] over the phone to discuss diagnosis prognosis, GOC, EOL wishes, disposition and options.  I introduced Palliative Medicine as specialized medical care for people living with serious illness. It focuses on  providing relief from the symptoms and stress of a serious illness. The goal is to improve quality of life for both the patient and the family.  We discussed a brief life review of the patient.  Patient worked for [employer] and then in the [employer] before retiring at the age of 68.  She shares he is a man that is on the go, likes to be up and moving, and has missed his independence since being wheelchair-bound after a fall in December of last year.  We discussed patient's current illness - retroperitoneal bleed, hypotension, poor functional status- and what it means in the larger context of patient's on-going co-morbidities - dementia, advanced age.    I attempted to elicit values and goals of care important to the patient.  [name] [name] remains hopeful that patient will "bounce back" and continue to show signs of improvement.  She is concerned with discharge planning as she does not believe he can be as independent as he was at home place.  She is concerned about disposition.  I assured her TOC is following closely for discharge planning.  Family is facing treatment option decisions, advanced directive, and anticipatory care needs.  [name] [name] shares she is open to further goals of care discussions.  We agreed to meet bedside  tomorrow, 3/25, at bedside at 10 AM to continue goals of care discussions.   Discussed with [name] [name] the importance of continued conversation with family and the medical providers regarding overall plan of care and treatment options, ensuring decisions are within the context of the patient's values and GOCs.    Questions and concerns were addressed.  [name] [name] was given PMT contact info and was encouraged to call with questions or concerns.   Primary Decision Maker NEXT OF KIN  Physical Exam Vitals reviewed.  Constitutional:      Appearance: He is ill-appearing.     Comments: Thin, frail  HENT:     Head: Normocephalic.     Mouth/Throat:     Mouth: Mucous membranes are dry.      Comments: Dry blood present Abdominal:     Palpations: Abdomen is soft.  Musculoskeletal:     Comments: Generalized weakness  Skin:    General: Skin is warm and dry.     Coloration: Skin is pale.  Psychiatric:        Behavior: Behavior normal.     Palliative Assessment/Data: 30-40%     Thank you for this consult. Palliative medicine will continue to follow and assist holistically.   Time Total: 75 minutes  Time spent includes: Detailed review of medical records (labs, imaging, vital signs), medically appropriate exam (mental status, respiratory, cardiac, skin), discussed with treatment team, counseling and educating patient, family and staff, documenting clinical information, medication management and coordination of care.  Signed by: Georgiann Cocker, DNP, FNP-BC Palliative Medicine   Please contact Palliative Medicine Team providers via [facility] for questions and concerns.

## 2024-03-21 NOTE — Plan of Care (Signed)
  Problem: Clinical Measurements: Goal: Will remain free from infection Outcome: Progressing Goal: Respiratory complications will improve Outcome: Progressing   Problem: Activity: Goal: Risk for activity intolerance will decrease Outcome: Progressing   Problem: Coping: Goal: Level of anxiety will decrease Outcome: Progressing   Problem: Elimination: Goal: Will not experience complications related to bowel motility Outcome: Progressing   Problem: Pain Managment: Goal: General experience of comfort will improve and/or be controlled Outcome: Progressing   Problem: Safety: Goal: Ability to remain free from injury will improve Outcome: Progressing   Problem: Skin Integrity: Goal: Risk for impaired skin integrity will decrease Outcome: Progressing   Problem: Education: Goal: Knowledge of General Education information will improve Description: Including pain rating scale, medication(s)/side effects and non-pharmacologic comfort measures Outcome: Not Progressing   Problem: Health Behavior/Discharge Planning: Goal: Ability to manage health-related needs will improve Outcome: Not Progressing   Problem: Clinical Measurements: Goal: Ability to maintain clinical measurements within normal limits will improve Outcome: Not Progressing Goal: Diagnostic test results will improve Outcome: Not Progressing Goal: Cardiovascular complication will be avoided Outcome: Not Progressing   Problem: Nutrition: Goal: Adequate nutrition will be maintained Outcome: Not Progressing   Problem: Elimination: Goal: Will not experience complications related to urinary retention Outcome: Not Progressing

## 2024-03-21 NOTE — Progress Notes (Signed)
 PT Cancellation Note  Patient Details Name: VERLIN UHER MRN: [id number] DOB: 12/02/1929   Cancelled Treatment:    Reason Eval/Treat Not Completed: Medical issues which prohibited therapy (MD requesting to hold due to medical issues. Will follow up next date or as appropriate.)  Donna Bernard, PT, MPT  Ina Homes 03/21/2024, 11:48 AM

## 2024-03-21 NOTE — Plan of Care (Signed)
Vital signs reviewed, ICU needs resolved  Will sign off at this time. No further recommendations at this time.     Gusta Marksberry David Jakarius Flamenco, M.D.  [facility]  Medical Director ICU-[facility] [place] Medical Director [facility] Cardio-P[facility] Department   

## 2024-03-21 NOTE — Consult Note (Signed)
 PHARMACY CONSULT NOTE - ELECTROLYTES  Pharmacy Consult for Electrolyte Monitoring and Replacement   Recent Labs: Height: 5\' 7"  (170.2 cm) Weight: 70.1 kg (154 lb 8.7 oz) IBW/kg (Calculated) : 66.1 Estimated Creatinine Clearance: 45.4 mL/min (by C-G formula based on SCr of 0.97 mg/dL).  Potassium (mmol/L)  Date Value  03/21/2024 4.8  02/04/2015 4.2   Magnesium (mg/dL)  Date Value  16/09/9603 1.9   Calcium (mg/dL)  Date Value  54/08/8118 7.4 (L)   Calcium, Total (mg/dL)  Date Value  14/78/2956 8.5   Albumin (g/dL)  Date Value  21/30/8657 2.1 (L)  02/04/2015 3.5   Phosphorus (mg/dL)  Date Value  84/69/6295 4.4   Sodium (mmol/L)  Date Value  03/21/2024 134 (L)  02/04/2015 141   Corrected Calcium: 8.92       (Ca 7.4,  albumin 2.1)   Assessment  [name] [name] is a 89 y.o. male presenting with a small bowel obstruction. PMH significant for severe PAD, systolic CHF secondary to ischemic cardiomyopathy, EF 35 to 40% 08/2023, CAD s/p CABG times 03/16/1998, and HTN. Pharmacy has been consulted to monitor and replace electrolytes.  Diet: CLD MIVF: none  Goal of Therapy: Electrolytes WNL  Plan:  No replacement currently indicated Check electrolytes with AM labs  Thank you for allowing pharmacy to be a part of this patient's care.  Bettey Costa, PharmD 03/21/2024 1:04 PM

## 2024-03-21 NOTE — Progress Notes (Signed)
 OT Cancellation Note  Patient Details Name: JERRET MCBANE MRN: [id number] DOB: 1929-03-22   Cancelled Treatment:    Reason Eval/Treat Not Completed: Medical issues which prohibited therapy. Per secure chart with MD, hold off therapies for today. Pt with drop in hemoglobin yesterday, needed 2 units, and has a new retroperitneal bleed on CTA last night. Will follow up as medically stable and continue to follow POC. Constance Goltz 03/21/2024, 8:35 AM

## 2024-03-21 NOTE — Progress Notes (Signed)
 Nutrition Follow Up Note   DOCUMENTATION CODES:   Severe malnutrition in context of chronic illness  INTERVENTION:   Ensure Enlive po TID, each supplement provides 350 kcal and 20 grams of protein.  Magic cup TID with meals, each supplement provides 290 kcal and 9 grams of protein  MVI po daily   Vitamin C 500mg  po BID   Pt at high refeed risk; recommend monitor potassium, magnesium and phosphorus labs daily until stable  Daily weights   NUTRITION DIAGNOSIS:   Severe Malnutrition related to chronic illness as evidenced by severe fat depletion, severe muscle depletion. -ongoing   GOAL:   Patient will meet greater than or equal to 90% of their needs -not met   MONITOR:   PO intake, Supplement acceptance, Labs, Weight trends, I & O's, Skin   ASSESSMENT:   89 y.o. male with medical history significant for severe PAD s/p multiple revascularizations most recently 01/22/2024 for critical limb ischemia and on chronic anticoagulation with Eliquis, PAF, systolic CHF secondary to ischemic cardiomyopathy, GERD, CAD s/p CABG times in 1999, HTN and SCC of the cheek currently on immunotherapy who is admitted with SBO now s/p exploratory laparotomy and reduction of small bowel volvulus 3/18 complicated by left retroperitoneal hematoma and shock.  Pt initiated on a clear liquid diet 3/22; pt advanced to full liquids yesterday. Per RN, patient did not eat much of his breakfast this morning but did drink 100% of an Ensure. RD will add supplements and vitamins to help pt meet his estimated needs. Pt is at high refeed risk. May need to consider NGT placement and nutrition support if pt's oral intake does not improve. Per chart, pt is up ~18lbs since admission. Pt +3.8L on his I & Os.   Medications reviewed and include: synthroid, protonix, thiamine, vasopressin    Labs reviewed: Na 134(L), K 4.8 wnl, P 4.4 wnl, Mg 1.9 wnl Wbc- 21.3(H), Hgb 7.9(L), Hct 21.8(L) Cbgs- 157, 176 x 48 hrs  Diet  Order:   Diet Order             Diet full liquid Fluid consistency: Thin  Diet effective now                  EDUCATION NEEDS:   No education needs have been identified at this time  Skin:  Skin Assessment: Reviewed RN Assessment (Neck; full thickness; cancerous, Left heel; Unstageable Pressure Injury, incision abdomen)  Last BM:  3/24- type 7  Height:   Ht Readings from Last 1 Encounters:  03/15/24 5\' 7"  (1.702 m)    Weight:   Wt Readings from Last 1 Encounters:  03/21/24 70.1 kg    Ideal Body Weight:  67.2 kg  BMI:  Body mass index is 24.2 kg/m.  Estimated Nutritional Needs:   Kcal:  1600-1800kcal/day  Protein:  80-90g/day  Fluid:  1.6-1.8L/day  [name] Holiday MS, RD, LDN If unable to be reached, please send secure chat to "RD inpatient" available from 8:00a-4:00p daily

## 2024-03-21 NOTE — TOC Progression Note (Signed)
 Transition of Care [facility]) - Progression Note    Patient Details  Name: MAC DOWDELL MRN: [id number] Date of Birth: 1929-01-25  Transition of Care [facility]) CM/SW Contact  Garret Reddish, RN Phone Number: 03/21/2024, 12:04 PM  Clinical Narrative:    Chart reviewed.  Noted that patient is a resident of [facility].  I have spoken with admission Coordinator Aleah.  She informs me that patient will need to have an assessment by their team before they can determine if he will be able to come back to [facility] on discharge.  Aleah reports that once ready for discharge to [facility] staff will need to call Sarah to complete an assessment.  Aleah informs me that prior to admission patient was able to get around in a wheelchair at the facility.  She reports that he was able to feed himself and assist some with his bathing.  Aleach reports that patient was active with [facility] for PT/OT at the facility.    Noted that patient become hypotensive on yesterday.  CT scan showed Left retroperitoneal hematoma.  Surgery following.    TOC will continue to follow for discharge planning.          Expected Discharge Plan and Services                                               Social Determinants of Health (SDOH) Interventions SDOH Screenings   Food Insecurity: No Food Insecurity (03/15/2024)  Housing: Low Risk  (03/15/2024)  Transportation Needs: No Transportation Needs (03/15/2024)  Utilities: Not At Risk (03/15/2024)  Depression (PHQ2-9): Low Risk  (02/09/2024)  Financial Resource Strain: Low Risk  (09/22/2023)   Received from [facility]  Social Connections: Moderately Isolated (03/15/2024)  Tobacco Use: Low Risk  (03/15/2024)    Readmission Risk Interventions     No data to display

## 2024-03-21 NOTE — Plan of Care (Signed)
  Problem: Coping: Goal: Level of anxiety will decrease Outcome: Progressing   Problem: Pain Managment: Goal: General experience of comfort will improve and/or be controlled Outcome: Progressing   Problem: Safety: Goal: Ability to remain free from injury will improve Outcome: Progressing   Problem: Skin Integrity: Goal: Risk for impaired skin integrity will decrease Outcome: Progressing

## 2024-03-22 DIAGNOSIS — R112 Nausea with vomiting, unspecified: Secondary | ICD-10-CM | POA: Diagnosis not present

## 2024-03-22 DIAGNOSIS — K56609 Unspecified intestinal obstruction, unspecified as to partial versus complete obstruction: Secondary | ICD-10-CM | POA: Diagnosis not present

## 2024-03-22 DIAGNOSIS — K683 Retroperitoneal hematoma: Secondary | ICD-10-CM

## 2024-03-22 DIAGNOSIS — R1030 Lower abdominal pain, unspecified: Secondary | ICD-10-CM | POA: Diagnosis not present

## 2024-03-22 DIAGNOSIS — I5022 Chronic systolic (congestive) heart failure: Secondary | ICD-10-CM

## 2024-03-22 LAB — BASIC METABOLIC PANEL
Anion gap: 8 (ref 5–15)
BUN: 19 mg/dL (ref 8–23)
CO2: 21 mmol/L — ABNORMAL LOW (ref 22–32)
Calcium: 7.7 mg/dL — ABNORMAL LOW (ref 8.9–10.3)
Chloride: 105 mmol/L (ref 98–111)
Creatinine, Ser: 1.09 mg/dL (ref 0.61–1.24)
GFR, Estimated: >60 mL/min (ref >60)
Glucose, Bld: 115 mg/dL — ABNORMAL HIGH (ref 70–99)
Potassium: 4.3 mmol/L (ref 3.5–5.1)
Sodium: 134 mmol/L — ABNORMAL LOW (ref 135–145)

## 2024-03-22 LAB — PREPARE FRESH FROZEN PLASMA: Unit division: 0

## 2024-03-22 LAB — BPAM FFP
Blood Product Expiration Date: 202503262359
ISSUE DATE / TIME: 202503240203
Unit Type and Rh: 6200

## 2024-03-22 LAB — HEMOGLOBIN AND HEMATOCRIT, BLOOD
HCT: 23 % — ABNORMAL LOW (ref 39.0–52.0)
HCT: 26.6 % — ABNORMAL LOW (ref 39.0–52.0)
HCT: 23 % — ABNORMAL LOW (ref 39.0–52.0)
Hemoglobin: 8 g/dL — ABNORMAL LOW (ref 13.0–17.0)
Hemoglobin: 8.9 g/dL — ABNORMAL LOW (ref 13.0–17.0)
Hemoglobin: 7.9 g/dL — ABNORMAL LOW (ref 13.0–17.0)

## 2024-03-22 LAB — PHOSPHORUS: Phosphorus: 3.1 mg/dL (ref 2.5–4.6)

## 2024-03-22 LAB — PREPARE RBC (CROSSMATCH)

## 2024-03-22 LAB — MAGNESIUM: Magnesium: 1.9 mg/dL (ref 1.7–2.4)

## 2024-03-22 NOTE — Consult Note (Signed)
 PHARMACY CONSULT NOTE - ELECTROLYTES  Pharmacy Consult for Electrolyte Monitoring and Replacement   Recent Labs: Height: 5\' 7"  (170.2 cm) Weight: 68.8 kg (151 lb 10.8 oz) IBW/kg (Calculated) : 66.1 Estimated Creatinine Clearance: 40.4 mL/min (by C-G formula based on SCr of 1.09 mg/dL).  Potassium (mmol/L)  Date Value  03/22/2024 4.3  02/04/2015 4.2   Magnesium (mg/dL)  Date Value  16/09/9603 1.9   Calcium (mg/dL)  Date Value  54/08/8118 7.7 (L)   Calcium, Total (mg/dL)  Date Value  14/78/2956 8.5   Albumin (g/dL)  Date Value  21/30/8657 2.1 (L)  02/04/2015 3.5   Phosphorus (mg/dL)  Date Value  84/69/6295 3.1   Sodium (mmol/L)  Date Value  03/22/2024 134 (L)  02/04/2015 141   Corrected Calcium: 8.92       (Ca 7.4,  albumin 2.1)   Assessment  [name] [name] is a 89 y.o. male presenting with a small bowel obstruction. PMH significant for severe PAD, systolic CHF secondary to ischemic cardiomyopathy, EF 35 to 40% 08/2023, CAD s/p CABG times 03/16/1998, and HTN. Pharmacy has been consulted to monitor and replace electrolytes.  Diet: CLD MIVF: none  Goal of Therapy: Electrolytes WNL  Plan:  No replacement currently indicated Check electrolytes with AM labs  Thank you for allowing pharmacy to be a part of this patient's care.  Bettey Costa, PharmD 03/22/2024 7:50 AM

## 2024-03-22 NOTE — Plan of Care (Signed)

## 2024-03-22 NOTE — Progress Notes (Signed)
   03/22/24 1500  Spiritual Encounters  Type of Visit Initial  Care provided to: Patient  Referral source Chaplain team  Reason for visit  (Consult to Spiritual Care)  OnCall Visit No  Spiritual Framework  Presenting Themes Other (comment);Meaning/purpose/sources of inspiration;Values and beliefs;Courage hope and growth (Pt stated he's [employer] and he and I sang his "theme" song together. Lyrics: "whether I go or if I stay, I'm a winner either way". He stated that he love Jesus and if he stays here or goes to heaven, he's a winner either way.)  Interventions  Spiritual Care Interventions Made Established relationship of care and support;Compassionate presence;Reflective listening;Explored values/beliefs/practices/strengths;Meaning making;Encouragement  Intervention Outcomes  Outcomes Connection to spiritual care;Awareness around self/spiritual resourses;Awareness of support;Connected to spiritual community

## 2024-03-22 NOTE — Progress Notes (Addendum)
 CC: SBO s/p Ex-lap with reduction of volvulus, left RP Bleed Subjective: Patient at baseline today which is slightly confused to the time.  He reports that his abdomen is not bloated.  He denies any nausea.  He says that his mouth is dry.  Per nursing staff he has had bowel movements that do not contain any blood.  He has been weaned off of pressors.  He did receive 1 unit of packed red blood cells last night.  They continue to trend his CBC  Objective: Vital signs in last 24 hours: Temp:  [97 F (36.1 C)-98.5 F (36.9 C)] 97.3 F (36.3 C) (03/25 0700) Pulse Rate:  [71-113] 86 (03/25 0800) Resp:  [11-32] 21 (03/25 0800) BP: (79-124)/(38-81) 119/55 (03/25 0800) SpO2:  [84 %-100 %] 91 % (03/25 0800) Weight:  [68.8 kg] 68.8 kg (03/25 0409) Last BM Date : 03/21/24  Intake/Output from previous day: 03/24 0701 - 03/25 0700 In: 395.9 [I.V.:45.9; Blood:350] Out: 600 [Urine:600] Intake/Output this shift: No intake/output data recorded.  Physical exam:  Abdomen is soft, slightly distended and tympanic.  Some bruising around the midline staples.  No erythema.  Appropriately tender to palpation.  He is currently in A-fib.  Lab Results: CBC  Recent Labs    03/20/24 0522 03/20/24 1737 03/21/24 0329 03/21/24 1208 03/21/24 1810 03/22/24 0158  WBC 11.4*  --  21.3*  --   --   --   HGB 6.9*   < > 8.1*   < > 7.1* 8.0*  HCT 21.3*   < > 22.5*   < > 20.3* 23.0*  PLT 186  --  124*  --   --   --    < > = values in this interval not displayed.   BMET Recent Labs    03/21/24 0329 03/22/24 0158  NA 134* 134*  K 4.8 4.3  CL 106 105  CO2 20* 21*  GLUCOSE 187* 115*  BUN 15 19  CREATININE 0.97 1.09  CALCIUM 7.4* 7.7*   PT/INR No results for input(s): "LABPROT", "INR" in the last 72 hours. ABG No results for input(s): "PHART", "HCO3" in the last 72 hours.  Invalid input(s): "PCO2", "PO2"  Studies/Results: CT Angio Abd/Pel w/ and/or w/o Result Date: 03/20/2024 CLINICAL DATA:   Nonvariceal upper GI bleed.  Negative endoscopy. EXAM: CTA ABDOMEN AND PELVIS WITHOUT AND WITH CONTRAST TECHNIQUE: Multidetector CT imaging of the abdomen and pelvis was performed using the standard protocol during bolus administration of intravenous contrast. Multiplanar reconstructed images and MIPs were obtained and reviewed to evaluate the vascular anatomy. RADIATION DOSE REDUCTION: This exam was performed according to the departmental dose-optimization program which includes automated exposure control, adjustment of the mA and/or kV according to patient size and/or use of iterative reconstruction technique. CONTRAST:  75mL OMNIPAQUE IOHEXOL 350 MG/ML SOLN COMPARISON:  CT chest, abdomen and pelvis with IV contrast 03/15/2024, CT abdomen and pelvis with IV contrast 04/27/2023. FINDINGS: VASCULAR Aorta: Heavily calcified but without flow limiting stenosis, occlusion, aneurysm or dissection. Celiac: Ostial calcific plaques cause at least an 80% high-grade origin stenosis. The rest of the vessel and branches opacify well. SMA: There are moderate to heavy patchy calcifications. There is a 50% vessel origin stenosis, mild less than 40% calcific stenosis of the remainder. The branch arteries opacified well. Renals: Both are single. There is high-grade calcific stenosis in both proximal renal arteries. Remainder of both renal arteries and the hilar branches opacified well. IMA: There is high-grade calcific stenosis in the proximal  2 cm of the vessel. Remainder opacifies well possibly through back fill. Inflow: There is a short segment calcified dissection flap chronically in the distal left common iliac artery. There are patchy calcific plaques in the common iliac and internal arteries, relatively minimal calcification in the external iliac arteries. No flow limiting stenosis of inflow vessels is seen. Proximal Outflow: There is a 60% calcific stenosis in right common femoral artery. Visualized outflow vessels also  demonstrate scattered calcific plaque with no other flow limiting stenosis. Veins: There is a collapsed IVC which could be due to hypovolemia related to left retroperitoneal hemorrhage. No other significant venous findings are seen on this arterial phase study. Review of the MIP images confirms the above findings. NON-VASCULAR Lower chest: Stable panchamber cardiomegaly with left main and heavy three-vessel coronary calcifications. No pericardial effusion. There are moderate bilateral pleural effusions, new on the left and increased on the right. There is compressive collapse or consolidation in both lower lobes. Old CABG changes. Hepatobiliary: Tiny layering cholelithiasis. No gallbladder wall thickening or bile duct dilatation. No liver mass enhancement. Cirrhotic capsular nodularity of the liver is again noted. Pancreas: The pancreas partially atrophic and otherwise unremarkable. Spleen: No mass, no splenic injury is seen.  No splenomegaly. Adrenals/Urinary Tract: There is cortical thinning and volume loss of both kidneys. Small Bosniak 1 and 2 cysts on the left. No focal abnormality on the right. There is no adrenal mass. There is no urinary stone or obstruction. The bladder is contracted although could be thickened. There is trace air in the bladder. Correlate clinically for cystitis. Air in the bladder could be due to catheterization or fistula. Stomach/Bowel: No contrast extravasation is seen into the stomach and small bowel. Contrast is in the colon through into the rectum and would obscure an active colonic bleed if present. There previously was mesenteric swirling centrally and moderate dilatation of multiple small bowel segments. The central mesenteric swirling is not seen today. There is still mesenteric clockwise swirling in the right lower abdomen, but the small bowel dilatation noted previously is no longer seen. There is still mild thickening in the small bowel wall in the mid and lower abdomen. This  could be inflammatory, congestive or ischemic but there is no bowel wall pneumatosis or portal venous gas. The appendix is normal. The wall of the large bowel is mostly contracted, unremarkable. Lymphatic: No enlarged lymph nodes. Reproductive: No prostatomegaly. Other: New from 5 days ago, there is enlargement and heterogeneity of the left psoas muscle consistent with interval psoas hemorrhage, extending out into the mid to distal left paracolic gutter. There is a large retroperitoneal hematoma in this area, with fluid-sediment levels. Including the psoas muscle, the left retroperitoneal hematoma measures 11.7 x 10.9 cm on 13:54, and 18.2 cm craniocaudal on 15: 57. There is a small amount of additional free hemorrhage in the left para colic gutter and posterior pelvis. In the upper abdomen, mild perihepatic ascites continues to be seen with additional low-density ascites settling above the bladder and within a left inguinal hernia. This fluid is also low in density. No free air is seen.  There is no incarcerated hernia. Musculoskeletal: There is osteopenia, degenerative change of the spine. Unchanged L1 compression fracture. Ankylosis over both SI joints. No acute or other significant osseous findings. IMPRESSION: 1. New 11.7 x 10.9 x 18.2 cm left retroperitoneal hematoma beginning in the psoas muscle with fluid-sediment levels, and a small amount of additional free hemorrhage in the left paracolic gutter and posterior pelvis.  2. No contrast extravasation is seen into the stomach and small bowel. Contrast is in the colon through into the rectum and would obscure an active colonic bleed if present. 3. Aortic and coronary artery atherosclerosis. 4. 80% high-grade calcific stenosis of the celiac artery origin. 5. 50% calcific stenosis of the SMA. 6. High-grade calcific stenosis of both proximal renal arteries. 7. 60% calcific stenosis of the right common femoral artery. 8. Collapsed IVC which could be due to  hypovolemia related to left retroperitoneal hemorrhage. 9. Moderate bilateral pleural effusions, new on the left and increased on the right. 10. Compressive collapse or consolidation in both lower lobes. 11. Cholelithiasis. 12. Cirrhotic liver with additionally again noted low-density ascites. 13. Persistent mild thickening in the small bowel wall in the mid and lower abdomen. This could be inflammatory, congestive or ischemic. No bowel wall pneumatosis or portal venous gas. 14. There previously was small-bowel dilatation which is no longer seen but there is still mesenteric vascular swirling in the right lower abdomen that could indicate mesenteric volvulus. 15. Trace air in the bladder. Correlate clinically for cystitis. Air in the bladder could be due to catheterization or fistula. 16. Osteopenia and degenerative change. Unchanged L1 compression fracture. 17. Critical Value/emergent results were called by telephone at the time of interpretation on 03/20/2024 at 11:27 pm to provider Webb Silversmith , who verbally acknowledged these results. Electronically Signed   By: Almira Bar M.D.   On: 03/20/2024 23:41   DG Chest Port 1 View Result Date: 03/20/2024 CLINICAL DATA:  Shortness of breath with nausea and vomiting, initial encounter EXAM: PORTABLE CHEST 1 VIEW COMPARISON:  02/20/2023 FINDINGS: Cardiac shadow is enlarged. Postsurgical changes are again seen. Elevation of left hemidiaphragm is noted. New right-sided pleural effusion is seen. No focal confluent infiltrate is noted. IMPRESSION: New right-sided pleural effusion. Electronically Signed   By: Alcide Clever M.D.   On: 03/20/2024 20:02    Anti-infectives: Anti-infectives (From admission, onward)    Start     Dose/Rate Route Frequency Ordered Stop   03/15/24 0800  piperacillin-tazobactam (ZOSYN) IVPB 3.375 g        3.375 g 12.5 mL/hr over 240 Minutes Intravenous Every 8 hours 03/15/24 0314 03/19/24 0834   03/15/24 0215  piperacillin-tazobactam  (ZOSYN) IVPB 3.375 g        3.375 g 100 mL/hr over 30 Minutes Intravenous  Once 03/15/24 0214 03/15/24 0310       Assessment/Plan:  Patient is postop day 7 from ex lap with reduction of small bowel volvulus for small bowel obstruction.  From a surgical standpoint doing well he is tolerating a full liquid diet although he has a poor appetite.  He is having bowel function.  He did develop a left RP bleed that I think is spontaneous given his anticoagulation.  He received 1 unit of packed red blood cells overnight and seemed to respond to that.  Recommend continuing trending CBC.  If CBC stable today can advance him to soft diet later today.  Continue with Ensure nutritional supplementation.  Delirium precautions.  If he continues to have downtrending hemoglobins then he would need IR consultation for potential embolization of any bleed.  Baker Pierini, M.D. [facility]

## 2024-03-22 NOTE — Progress Notes (Signed)
 Physical Therapy Treatment Patient Details Name: [name] [name] MRN: [id number] DOB: [birth year] Today's Date: 03/22/2024   History of Present Illness Pt admitted for SBO s/p exploratory laparotomy. Left RP bleed with transfer to ICU. PMHx of critical limb ischemia s/p femoral endarterectomy, CHF, CAD s/p CABG, on chronic anticoagulation, immunotherapy for recurrent squamous cell carcinoma.    PT Comments  Patient is agreeable to PT with encouragement. He is confused but cooperative. The patient required +2 person assistance for bed mobility. Initial assistance required to maintain sitting balance with posterior lean. Incremental lateral scooting required +2 person assistance with cues for technique. Standing not attempted due to limited activity tolerance. Recommend to continue PT to maximize independence. Anticipate patient will need at least initial assistance with physical mobility after this hospital stay.    If plan is discharge home, recommend the following: Two people to help with walking and/or transfers;A lot of help with bathing/dressing/bathroom;Help with stairs or ramp for entrance;Supervision due to cognitive status   Can travel by private vehicle     No  Equipment Recommendations  None recommended by PT    Recommendations for Other Services       Precautions / Restrictions Precautions Precautions: Fall Recall of Precautions/Restrictions: Impaired Restrictions Weight Bearing Restrictions Per Provider Order: No     Mobility  Bed Mobility Overal bed mobility: Needs Assistance Bed Mobility: Supine to Sit, Sit to Supine     Supine to sit: Max assist, +2 for physical assistance Sit to supine: Max assist, +2 for physical assistance   General bed mobility comments: assistance for trunk and BLE support. verbal cues for technique and sequencing    Transfers Overall transfer level: Needs assistance                Lateral/Scoot Transfers: Max assist, +2 physical  assistance General transfer comment: incremental scooting to the right with maximal cues for technique. patient does not place feet on the floor consistently despite cues. standing not attempted. hard posterior lean with scooting attempts    Ambulation/Gait                   Stairs             Wheelchair Mobility     Tilt Bed    Modified Rankin (Stroke Patients Only)       Balance Overall balance assessment: Needs assistance Sitting-balance support: Feet unsupported, Single extremity supported Sitting balance-Leahy Scale: Poor Sitting balance - Comments: external support required, posterior lean. faciliation and cues for anterior weight shifting Postural control: Posterior lean                                  Communication Communication Communication: Impaired Factors Affecting Communication: Difficulty expressing self;Hearing impaired  Cognition Arousal: Alert Behavior During Therapy: WFL for tasks assessed/performed   PT - Cognitive impairments: History of cognitive impairments                         Following commands: Impaired Following commands impaired: Follows one step commands with increased time    Cueing Cueing Techniques: Verbal cues, Tactile cues  Exercises      General Comments        Pertinent Vitals/Pain Pain Assessment Pain Assessment: Faces Faces Pain Scale: Hurts even more Pain Location: L heel Pain Descriptors / Indicators: Discomfort, Grimacing Pain Intervention(s): Monitored during session, Limited activity within patient's  tolerance, Repositioned (prevlon boots in place at end of session for off loading)    Home Living                          Prior Function            PT Goals (current goals can now be found in the care plan section) Acute Rehab PT Goals Patient Stated Goal: to walk again PT Goal Formulation: With patient Time For Goal Achievement: 03/30/24 Potential to Achieve  Goals: Fair Progress towards PT goals: Progressing toward goals    Frequency    Min 2X/week      PT Plan      Co-evaluation PT/OT/SLP Co-Evaluation/Treatment: Yes (partial co-treatment) Reason for Co-Treatment: Complexity of the patient's impairments (multi-system involvement);To address functional/ADL transfers PT goals addressed during session: Mobility/safety with mobility        AM-PAC PT "6 Clicks" Mobility   Outcome Measure  Help needed turning from your back to your side while in a flat bed without using bedrails?: A Little Help needed moving from lying on your back to sitting on the side of a flat bed without using bedrails?: A Lot Help needed moving to and from a bed to a chair (including a wheelchair)?: A Lot Help needed standing up from a chair using your arms (e.g., wheelchair or bedside chair)?: A Lot Help needed to walk in hospital room?: Total Help needed climbing 3-5 steps with a railing? : Total 6 Click Score: 11    End of Session Equipment Utilized During Treatment: Oxygen Activity Tolerance: Patient limited by fatigue Patient left: in bed;with call bell/phone within reach;with bed alarm set Nurse Communication: Mobility status PT Visit Diagnosis: Unsteadiness on feet (R26.81);Muscle weakness (generalized) (M62.81);History of falling (Z91.81);Difficulty in walking, not elsewhere classified (R26.2)     Time: 0865-7846 PT Time Calculation (min) (ACUTE ONLY): 20 min  Charges:    $Therapeutic Activity: 8-22 mins PT General Charges $$ ACUTE PT VISIT: 1 Visit                     Donna Bernard, PT, MPT    Ina Homes 03/22/2024, 12:52 PM

## 2024-03-22 NOTE — Progress Notes (Signed)
 Palliative Care Progress Note, Assessment & Plan   Patient Name: [name] [name]       Date: 03/22/2024 DOB: 1929-07-22  Age: 89 y.o. MRN#: [id number] Attending Physician: Pennie Banter, DO Primary Care Physician: [name], MD Admit Date: 03/14/2024  Subjective: Patient is sitting up, awake, alert, and oriented to person, place, and situation.  His daughter [name] [name] and her husband are at bedside during my visit.  Patient acknowledges my presence and is able to make his wishes known.  HPI: 89 y.o. male  with past medical history of severe PAD s/p multiple revascularizations (most recent-01/22/2024 for critical limb ischemia) on chronic anticoagulation-Eliquis, systolic CHF secondary to ischemic cardiomyopathy (EF 35 to 40% on 08/2023), CAD s/p CABG x 3 (1999), and HTN admitted on 03/14/2024 with nausea and vomiting with lower abdominal pain.  Patient has been diagnosed with SBO.   3/18, patient underwent ISIS of adhesion.  Volvulus with hemorrhagic bowel encountered at but no resection performed.   3/23, CT of abdomen and pelvis revealed retroperitoneal hematoma active extravasation. Patient given 2 units RBCs.   Evening of 3/23, patient became hypotensive but asymptomatic.  Patient transferred to ICU.    PMT was consulted to support patient and family with goals of care discussions.   Summary of counseling/coordination of care: Extensive chart review completed prior to meeting patient including labs, vital signs, imaging, progress notes, orders, and available advanced directive documents from current and previous encounters.   After reviewing the patient's chart and assessing the patient at bedside, I spoke with patient in regards to symptom management and goals of care.   Symptoms assessed.   Patient shares he "feels well".  He also states, "I know where I am going, I am going to be with the Lord, and I am going soon".  When asked to elaborate, patient shares he believes he is not long for this world.  He plans on walking the "streets of gold very soon".  He denies any acute issues, and has no complaints of pain or discomfort at this time.  Human mortality, quality versus quantity of life, and spiritual and emotional component of patient's overall wellbeing reviewed with patient and family at bedside.  Patient's daughter [name] [name] is at bedside and tearful.  She shares is the first time the patient is ever stated he is ready to go on to be with the Lord.  She states he has said for several years that he wants to make it to his 89th birthday.  Therapeutic silence, active listening, and emotional support provided.  Discussed that patient's medical status is showing slight improvement.  He is stable at the moment and is not in critical condition.  Therefore, I discussed that I do not believe patient is actively dying at the moment.  Patient shares he understands but knows that he will be "with the Lord soon".  Confirmed with patient and family that patient wishes to not have any artificial means to sustain his life.  DNR with limited interventions remains.  Patient and family agreeable to continue to treat the treatable.  They do not wish to stop any medical treatments at this time.  However, patient has very strong sense  that he will pass soon.  Spiritual care consult placed for support.  Discussed with attending.  Plan remains to continue to monitor patient.  PMT will continue to follow and support patient and family throughout his hospitalization.  Physical Exam Vitals reviewed.  Constitutional:      Appearance: He is normal weight.  HENT:     Head: Normocephalic.     Mouth/Throat:     Mouth: Mucous membranes are moist.  Eyes:     Pupils: Pupils are equal, round, and reactive to  light.  Cardiovascular:     Rate and Rhythm: Normal rate.  Pulmonary:     Effort: Pulmonary effort is normal.  Abdominal:     Palpations: Abdomen is soft.  Musculoskeletal:     Comments: Generalized weakness Non-pitting edema of right hand  Skin:    General: Skin is warm and dry.     Coloration: Skin is pale.  Neurological:     Mental Status: He is alert and oriented to person, place, and time.  Psychiatric:        Mood and Affect: Mood normal.        Behavior: Behavior normal.        Thought Content: Thought content normal.        Judgment: Judgment normal.             Total Time 50 minutes   Time spent includes: Detailed review of medical records (labs, imaging, vital signs), medically appropriate exam (mental status, respiratory, cardiac, skin), discussed with treatment team, counseling and educating patient, family and staff, documenting clinical information, medication management and coordination of care.  Samara Deist L. Bonita Quin, DNP, FNP-BC Palliative Medicine Team

## 2024-03-22 NOTE — Evaluation (Signed)
 Occupational Therapy Evaluation Patient Details Name: [name] [name] MRN: [id number] DOB: [birth year] Today's Date: 03/22/2024   History of Present Illness   Pt admitted for SBO s/p exploratory laparotomy. Left RP bleed with transfer to ICU. PMHx of critical limb ischemia s/p femoral endarterectomy, CHF, CAD s/p CABG, on chronic anticoagulation, immunotherapy for recurrent squamous cell carcinoma.     Clinical Impressions Pt was seen for OT re-evaluation this date after being transitioned to ICU. He continues to have deficits in strength, sequencing, activity tolerance and balance. Pt currently requires 2 person max assist for bed mobility. Poor seated tolerance and balance with frequent cueing to place feet on the floor. Max A to forward scoot, Max A x2 to lateral scoot towards HOB. Elevated RUE on pillow and edu on hand exercises to promote circulation and decrease swelling. Pt able to wipe his nose with tissue with set up assist. Pt would benefit from skilled OT services to address noted impairments and functional limitations (see below for any additional details) in order to maximize safety and independence while minimizing falls risk and caregiver burden. Do anticipate the need for follow up OT services upon acute hospital DC.      If plan is discharge home, recommend the following:   A lot of help with bathing/dressing/bathroom;Help with stairs or ramp for entrance;Two people to help with walking and/or transfers     Functional Status Assessment   Patient has had a recent decline in their functional status and demonstrates the ability to make significant improvements in function in a reasonable and predictable amount of time.     Equipment Recommendations   Other (comment) (defer)     Recommendations for Other Services         Precautions/Restrictions   Precautions Precautions: Fall Recall of Precautions/Restrictions: Impaired Restrictions Weight Bearing  Restrictions Per Provider Order: No     Mobility Bed Mobility Overal bed mobility: Needs Assistance Bed Mobility: Supine to Sit, Sit to Supine     Supine to sit: Max assist, +2 for physical assistance Sit to supine: Max assist, +2 for physical assistance   General bed mobility comments: max A x2 with cueing for hand placement, sequencing and initation of task    Transfers                   General transfer comment: deferred d/t limited seated tolerance and Max A x2 for lateral scooting, Max A to forward scoot and unable to keep feet on floor consistently      Balance Overall balance assessment: Needs assistance Sitting-balance support: Feet unsupported, Single extremity supported Sitting balance-Leahy Scale: Poor Sitting balance - Comments: posterior lean, cueing and facilitation needed for forward weight shift-constant CGA to Mod A for seated balance                                   ADL either performed or assessed with clinical judgement   ADL Overall ADL's : Needs assistance/impaired     Grooming: Wash/dry face;Set up Grooming Details (indicate cue type and reason): able to wipe his nose with set up assist                               General ADL Comments: likely Min A for UB ADLs and Max/total LB ADLs     Vision  Perception         Praxis         Pertinent Vitals/Pain Pain Assessment Pain Assessment: Faces Faces Pain Scale: Hurts even more Pain Location: L heel Pain Descriptors / Indicators: Discomfort, Grimacing Pain Intervention(s): Monitored during session, Repositioned, Limited activity within patient's tolerance (prevlon booties in place at end of session)     Extremity/Trunk Assessment Upper Extremity Assessment Upper Extremity Assessment: Generalized weakness   Lower Extremity Assessment Lower Extremity Assessment: Generalized weakness       Communication Communication Communication:  Impaired Factors Affecting Communication: Difficulty expressing self;Hearing impaired   Cognition Arousal: Alert Behavior During Therapy: WFL for tasks assessed/performed                                 Following commands: Impaired Following commands impaired: Follows one step commands with increased time     Cueing  General Comments   Cueing Techniques: Verbal cues;Tactile cues      Exercises Other Exercises Other Exercises: edu on R hand exercises to promote circulation and decrease swelling; also elevated on pillow   Shoulder Instructions      Home Living Family/patient expects to be discharged to:: Assisted living                             Home Equipment: Standard Walker;Cane - single point;Shower seat   Additional Comments: history obtained from chart review as pt is very confused at baseline and is poor historian      Prior Functioning/Environment               Mobility Comments: uses a rollator. Was getting HHPT at ALF. Able to perform transfers from bed to W/C and toilet. Per pt- reports he hasn't been able to walk in "a while" ADLs Comments: gets help wiping    OT Problem List: Decreased strength;Decreased activity tolerance;Decreased safety awareness;Impaired balance (sitting and/or standing)   OT Treatment/Interventions: Self-care/ADL training;Therapeutic activities;Therapeutic exercise;Energy conservation;Patient/family education;DME and/or AE instruction;Balance training      OT Goals(Current goals can be found in the care plan section)   Acute Rehab OT Goals Patient Stated Goal: get better OT Goal Formulation: With patient Time For Goal Achievement: 04/05/24 Potential to Achieve Goals: Fair ADL Goals Pt Will Perform Grooming: with contact guard assist;sitting Pt Will Perform Upper Body Bathing: with contact guard assist;sitting Pt Will Transfer to Toilet: with min assist;stand pivot transfer;bedside commode;with mod  assist   OT Frequency:  Min 2X/week    Co-evaluation PT/OT/SLP Co-Evaluation/Treatment: Yes Reason for Co-Treatment: Complexity of the patient's impairments (multi-system involvement);To address functional/ADL transfers PT goals addressed during session: Mobility/safety with mobility OT goals addressed during session: ADL's and self-care      AM-PAC OT "6 Clicks" Daily Activity     Outcome Measure Help from another person eating meals?: None Help from another person taking care of personal grooming?: A Little Help from another person toileting, which includes using toliet, bedpan, or urinal?: A Lot Help from another person bathing (including washing, rinsing, drying)?: A Lot Help from another person to put on and taking off regular upper body clothing?: A Little Help from another person to put on and taking off regular lower body clothing?: A Lot 6 Click Score: 16   End of Session Equipment Utilized During Treatment: Oxygen Nurse Communication: Mobility status  Activity Tolerance: Patient limited by fatigue;Patient tolerated treatment  well Patient left: with call bell/phone within reach;in bed;with bed alarm set  OT Visit Diagnosis: Other abnormalities of gait and mobility (R26.89);Unsteadiness on feet (R26.81);Muscle weakness (generalized) (M62.81)                Time: 1610-9604 OT Time Calculation (min): 12 min Charges:  OT General Charges $OT Visit: 1 Visit OT Evaluation $OT Re-eval: 1 Re-eval  Christorpher Hisaw, OTR/L  03/22/24, 1:09 PM  Parth Mccormac E Raed Schalk 03/22/2024, 1:09 PM

## 2024-03-22 NOTE — Progress Notes (Signed)
 PROGRESS NOTE    [name] [name]  RUE:[id number] DOB: 1929/02/21 DOA: 03/14/2024 PCP: [name], MD  Outpatient Specialists: cardiology, oncology    Brief Narrative:   From admission h and p  [name] [name] is a 89 y.o. male with medical history significant for Severe PAD s/p multiple revascularization most recently 01/22/2024 for critical limb ischemia, and on chronic anticoagulation with Eliquis, systolic CHF secondary to ischemic cardiomyopathy, EF 35 to 40% 08/2023, CAD s/p CABG times 03/16/1998, HTN, who is being admitted with a small bowel obstruction, after presenting with nausea vomiting lower abdominal pain that started around noon on 3/17.  He had no diarrhea, no blood in the stool, no fever or chills.   Assessment & Plan:   Principal Problem:   Small bowel obstruction (HCC) Active Problems:   Lactic acidosis   CAD with history of CABG x3 (coronary artery disease)   PVD (peripheral vascular disease) (HCC)   Chronic anticoagulation   History of revascularization procedure of lower extremity   Chronic heart failure with reduced ejection fraction (HFrEF, <= 40%) (HCC)   Ischemic cardiomyopathy   HTN (hypertension), benign   Acquired hypothyroidism   A-fib (HCC)   Type 2 diabetes mellitus with peripheral angiopathy (HCC)   Recurrent squamous cell carcinoma in situ (SCCIS) of skin of cheek   Protein-calorie malnutrition, severe   Nontraumatic retroperitoneal hematoma   ABLA (acute blood loss anemia)   Hemorrhagic shock (HCC)  # SBO s/p ex lap with lysis of adhesions 03/15/24.  Volvulus with hemorrhagic bowel encountered, no resection performed.  Bowel movement reported 3/23.  --On full liquid diet --Diet per general surgery  --Pain control PRN --Wound care  --F/u RD recommendations   # Delirium S/p surgery , mildly confused, intermittent hallucinations --delirium precautions  # Acute blood loss anemia # Hemorrhagic shock # Retroperitoneal hematoma  LEFT 3/24 -- pt decompensated evening of 3/23, CTA showed new LEFT retroperitoneal  hematoma.  Transfused 2 units RBC's (3 total yesterday)  3/25 -- given additional 1 unit RBC's yesterday evening >> Hbg 7.1 >> 8.0 >> 8.9 --Trend Hbg --Stop heparin & avoid anticoagulation --Dialy CBC's --Monitor left flank for pain, swelling signs of ongoing bleeding  # SCC left neck --Wound care RN recs placed 3/19 --Follow up with Oncology  # Ulcer left heel Chronic, followed by wound care as outpt, daughter says slowly improving -_Wound care RN recs placed 3/19 --Offloading boots  # CAD Hx cabg x3, stable - no chest pain --Statin --Off ASA/Plavix due to bleeding  # Hypothyroid --Continue Synthroid  # Chronic HFrEF --Lasix and metoprolol on hold, requiring pressors  # Skin tear Right forearm, happened 3/21  --Appreciate wound care's recommendations --Wound care --Monitor for any signs of infection  # A-fib HR's up in setting of hemorrhagic shock --Hold metoprolol --Off heparin infusion for same --Telemetry --Replace electrolytes   # PAD S/p multiple vascular procedures most recently January of this year --Hold aspirin and anticoagulation for now --Continue statin  # T2DM Well controlled. Last A1c 5.1   # R flank ecchymosis  Appears superficial, no internal bleeding seen on CT of chest Daughter reports this due to a fall PTA. --Monitor  # Debility Resides at [facility]. PT advises snf --TOC consulted --Palliative care consulted   #Goals of care --Code status DNR/DNI --Palliative Care consulted Family agreeable to having more in depth talks about goals of care given patient's recent decline and severity of multiple acute issues   DVT prophylaxis:  none - due to retroperitoneal hematoma and hemorrhagic shock  Code Status: dnr/dni  Family Communication: daughter and son-in-law at bedside on rounds this AM 3/23   Level of care: ICU Status is: Inpatient Remains  inpatient appropriate because: severity of illness    Consultants:  General Surgery Palliative Care  Procedures: Ex lap  Antimicrobials:  S/p zosyn    Subjective:  Patient seen awake sitting up in bed in ICU this AM.  He reports feeling okay, some cough at times.  No shortness of breath.  Denies left back or flank pain.  Objective: Vitals:   03/22/24 0800 03/22/24 0900 03/22/24 1000 03/22/24 1100  BP: (!) 119/55 113/62 (!) 99/54 (!) 119/54  Pulse: 86 91 86 88  Resp: (!) 21 18 17 14   Temp:      TempSrc:      SpO2: 91% 92% 96% 96%  Weight:      Height:        Intake/Output Summary (Last 24 hours) at 03/22/2024 1328 Last data filed at 03/22/2024 0900 Gross per 24 hour  Intake 591.61 ml  Output 200 ml  Net 391.61 ml   Filed Weights   03/20/24 0407 03/21/24 0545 03/22/24 0409  Weight: 72.3 kg 70.1 kg 68.8 kg    Physical Exam   General exam: awake, drowsy, no acute distress, frail HEENT: moist mucus membranes, hearing grossly normal  Respiratory system: CTAB, no wheezes, rales or rhonchi, normal respiratory effort. Cardiovascular system: normal S1/S2  RRR, no lower extremity edema.   Gastrointestinal system: midline surgical incision appears well without signs of bleeding or surrounding erythema swelling or other signs of infection Central nervous system: more alert & conversational, A&Ox2+ no gross focal neurologic deficits, normal speech Extremities: LUE edema, offloading boots on feet, no BLE edema Skin: color is improved, minimal ecchymosis of LEFT flank, scattered ecchymosis of extremities     Data Reviewed:   Notable labs --   Hbg 8.1 >> 7.9 after 2 units RBC's overnight >> 7.1 (1 unit) >> 8.0 >> 8.9 this AM  Na 134 Bicarb 21 Glucose 115 Ca 7.7  Albumin 2.1 Tbili 1.5   WBC 21.3 Platelets 124k  3/24 Lactic acid peaked overnight 7.1 >> 2.3 in setting of hemorrhagic shock      Scheduled Meds:  vitamin C  500 mg Oral BID   Chlorhexidine  Gluconate Cloth  6 each Topical Q2200   feeding supplement  237 mL Oral TID BM   leptospermum manuka honey  1 Application Topical Daily   levothyroxine  112 mcg Oral Q0600   multivitamin with minerals  1 tablet Oral Daily   pantoprazole (PROTONIX) IV  40 mg Intravenous Daily   simvastatin  40 mg Oral QPM   thiamine (VITAMIN B1) injection  100 mg Intravenous Daily   Continuous Infusions:     LOS: 7 days     Pennie Banter, DO [facility]   If 7PM-7AM, please contact night-coverage [email or web address] Password [email or web address] 03/22/2024, 1:28 PM

## 2024-03-23 DIAGNOSIS — R112 Nausea with vomiting, unspecified: Secondary | ICD-10-CM | POA: Diagnosis not present

## 2024-03-23 DIAGNOSIS — K56609 Unspecified intestinal obstruction, unspecified as to partial versus complete obstruction: Secondary | ICD-10-CM | POA: Diagnosis not present

## 2024-03-23 DIAGNOSIS — R1030 Lower abdominal pain, unspecified: Secondary | ICD-10-CM | POA: Diagnosis not present

## 2024-03-23 DIAGNOSIS — K559 Vascular disorder of intestine, unspecified: Secondary | ICD-10-CM | POA: Diagnosis not present

## 2024-03-23 LAB — TYPE AND SCREEN
ABO/RH(D): O NEG
ABO/RH(D): O NEG
Antibody Screen: NEGATIVE
Antibody Screen: NEGATIVE
Unit division: 0
Unit division: 0
Unit division: 0
Unit division: 0
Unit division: 0
Unit division: 0

## 2024-03-23 LAB — CBC
HCT: 23.6 % — ABNORMAL LOW (ref 39.0–52.0)
Hemoglobin: 8.1 g/dL — ABNORMAL LOW (ref 13.0–17.0)
MCH: 30.1 pg (ref 26.0–34.0)
MCHC: 34.3 g/dL (ref 30.0–36.0)
MCV: 87.7 fL (ref 80.0–100.0)
Platelets: 151 10*3/uL (ref 150–400)
RBC: 2.69 MIL/uL — ABNORMAL LOW (ref 4.22–5.81)
RDW: 18.1 % — ABNORMAL HIGH (ref 11.5–15.5)
WBC: 15.5 10*3/uL — ABNORMAL HIGH (ref 4.0–10.5)
nRBC: 0.6 % — ABNORMAL HIGH (ref 0.0–0.2)

## 2024-03-23 LAB — PREPARE RBC (CROSSMATCH)

## 2024-03-23 LAB — BPAM RBC
Blood Product Expiration Date: 202504172359
Blood Product Expiration Date: 202504192359
Blood Product Expiration Date: 202504192359
Blood Product Expiration Date: 202503242359
Blood Product Expiration Date: 202504152359
Blood Product Expiration Date: 202504152359
ISSUE DATE / TIME: 202503242010
ISSUE DATE / TIME: 202503231237
ISSUE DATE / TIME: 202503231934
ISSUE DATE / TIME: 202503232313
Unit Type and Rh: 5100
Unit Type and Rh: 5100
Unit Type and Rh: 5100
Unit Type and Rh: 5100
Unit Type and Rh: 5100
Unit Type and Rh: 9500

## 2024-03-23 LAB — BASIC METABOLIC PANEL
Anion gap: 4 — ABNORMAL LOW (ref 5–15)
BUN: 17 mg/dL (ref 8–23)
CO2: 24 mmol/L (ref 22–32)
Calcium: 8.1 mg/dL — ABNORMAL LOW (ref 8.9–10.3)
Chloride: 106 mmol/L (ref 98–111)
Creatinine, Ser: 0.74 mg/dL (ref 0.61–1.24)
GFR, Estimated: >60 mL/min (ref >60)
Glucose, Bld: 97 mg/dL (ref 70–99)
Potassium: 4.3 mmol/L (ref 3.5–5.1)
Sodium: 134 mmol/L — ABNORMAL LOW (ref 135–145)

## 2024-03-23 LAB — MAGNESIUM: Magnesium: 2.1 mg/dL (ref 1.7–2.4)

## 2024-03-23 LAB — PHOSPHORUS: Phosphorus: 2.4 mg/dL — ABNORMAL LOW (ref 2.5–4.6)

## 2024-03-23 LAB — BRAIN NATRIURETIC PEPTIDE: B Natriuretic Peptide: 383.7 pg/mL — ABNORMAL HIGH (ref 0.0–100.0)

## 2024-03-23 MED ORDER — PANTOPRAZOLE SODIUM 40 MG PO TBEC
40.0000 mg | DELAYED_RELEASE_TABLET | Freq: Every day | ORAL | Status: DC
  Administered 2024-03-23 – 2024-04-03 (×11): 40 mg via ORAL
  Filled 2024-03-23 (×12): qty 1

## 2024-03-23 MED ORDER — POLYETHYLENE GLYCOL 3350 17 G PO PACK
17.0000 g | PACK | Freq: Every day | ORAL | Status: DC | PRN
  Administered 2024-03-28 – 2024-03-29 (×2): 17 g via ORAL
  Filled 2024-03-23 (×2): qty 1

## 2024-03-23 MED ORDER — SENNA 8.6 MG PO TABS
1.0000 | ORAL_TABLET | Freq: Every day | ORAL | Status: DC
  Administered 2024-03-23 – 2024-04-02 (×11): 8.6 mg via ORAL
  Filled 2024-03-23 (×12): qty 1

## 2024-03-23 MED ORDER — FUROSEMIDE 10 MG/ML IJ SOLN
40.0000 mg | Freq: Once | INTRAMUSCULAR | Status: AC
  Administered 2024-03-23: 40 mg via INTRAVENOUS
  Filled 2024-03-23: qty 4

## 2024-03-23 MED ORDER — DOCUSATE SODIUM 100 MG PO CAPS
100.0000 mg | ORAL_CAPSULE | Freq: Every day | ORAL | Status: DC
  Administered 2024-03-23 – 2024-04-04 (×13): 100 mg via ORAL
  Filled 2024-03-23 (×14): qty 1

## 2024-03-23 MED ORDER — BISACODYL 10 MG RE SUPP
10.0000 mg | Freq: Once | RECTAL | Status: AC
  Administered 2024-03-23: 10 mg via RECTAL
  Filled 2024-03-23: qty 1

## 2024-03-23 MED ORDER — K PHOS MONO-SOD PHOS DI & MONO 155-852-130 MG PO TABS
500.0000 mg | ORAL_TABLET | ORAL | Status: AC
Start: 2024-03-23 — End: 2024-03-23
  Administered 2024-03-23 (×2): 500 mg via ORAL
  Filled 2024-03-23 (×2): qty 2

## 2024-03-23 MED ORDER — THIAMINE MONONITRATE 100 MG PO TABS
100.0000 mg | ORAL_TABLET | Freq: Every day | ORAL | Status: DC
  Administered 2024-03-23 – 2024-04-03 (×11): 100 mg via ORAL
  Filled 2024-03-23 (×12): qty 1

## 2024-03-23 NOTE — Progress Notes (Signed)
 Palliative Care Progress Note, Assessment & Plan   Patient Name: [name] [name]       Date: 03/23/2024 DOB: [birth year]  Age: 89 y.o. MRN#: [id number] Attending Physician: Loyce Dys, MD Primary Care Physician: [name], MD Admit Date: 03/14/2024  Subjective: Patient is lying in bed in no apparent distress.  He is sleeping with respirations even and unlabored.  No family or friends present during my visit.  HPI: 89 y.o. male  with past medical history of severe PAD s/p multiple revascularizations (most recent-01/22/2024 for critical limb ischemia) on chronic anticoagulation-Eliquis, systolic CHF secondary to ischemic cardiomyopathy (EF 35 to 40% on 08/2023), CAD s/p CABG x 3 (1999), and HTN admitted on 03/14/2024 with nausea and vomiting with lower abdominal pain.  Patient has been diagnosed with SBO.   3/18, patient underwent ISIS of adhesion.  Volvulus with hemorrhagic bowel encountered at but no resection performed.   3/23, CT of abdomen and pelvis revealed retroperitoneal hematoma active extravasation. Patient given 2 units RBCs.   Evening of 3/23, patient became hypotensive but asymptomatic.  Patient transferred to ICU.    PMT was consulted to support patient and family with goals of care discussions.   Summary of counseling/coordination of care: Extensive chart review completed prior to meeting patient including labs, vital signs, imaging, progress notes, orders, and available advanced directive documents from current and previous encounters.   After reviewing the patient's chart, I assessed the patient at bedside.  He is sleeping and easily awakens to my presence.  He returns back to sleep.  He is unable to participate in goals of care medical decision making independently.  After  visiting with the patient, I counseled with dayshift RN.  She shares concerns that patient has a wet cough and has required increased supplemental oxygen overnight and today.  She shares she has conveyed her concerns to attending and is awaiting a response.  After meeting with patient and RN, I spoke with patient's daughter [name] [name] over the phone.  Brief medical update given.  [name] [name] shares that attending has mentioned that patient will likely need to discharge with hospice services to follow.  I attempted to elicit Wanda's thoughts on these recommendations.  She shares she believes this is what is in her father's best interest.  She knows that her father is "tired" and that he is starting to feel worn out.  She wants him to be kept comfortable and free from pain.  She is hopeful that home place will be able to take him back with hospice services to follow.  Hospice services, philosophy, and aging in place discussed in detail.  Education provided on human mortality and failure to thrive.    [name] [name] is tearful over the phone but is hopeful that plans can be put in place for patient to discharge with hospice services to follow at [facility].  DNR with limited interventions remains.  Plan of care during hospitalization remains.  Discussed with [name] [name] that plan remains to treat the treatable.  She endorses wanting to take it one day at a time.  PMT will continue to follow and support patient and family throughout his hospitalization.  No adjustment to plan of care  at this time.  Physical Exam Vitals reviewed.  Constitutional:      General: He is not in acute distress.    Appearance: He is normal weight.  HENT:     Head: Normocephalic.     Mouth/Throat:     Mouth: Mucous membranes are moist.  Pulmonary:     Effort: Pulmonary effort is normal.     Comments: [place] in place Abdominal:     Palpations: Abdomen is soft.  Musculoskeletal:     Comments: Generalized weakness  Skin:    General: Skin is warm  and dry.     Coloration: Skin is pale.  Psychiatric:        Behavior: Behavior normal.             Total Time 35 minutes   Time spent includes: Detailed review of medical records (labs, imaging, vital signs), medically appropriate exam (mental status, respiratory, cardiac, skin), discussed with treatment team, counseling and educating patient, family and staff, documenting clinical information, medication management and coordination of care.  Samara Deist L. Bonita Quin, DNP, FNP-BC Palliative Medicine Team

## 2024-03-23 NOTE — Consult Note (Addendum)
 PHARMACY CONSULT NOTE - ELECTROLYTES  Pharmacy Consult for Electrolyte Monitoring and Replacement   Recent Labs: Height: 5\' 7"  (170.2 cm) Weight: 68.8 kg (151 lb 10.8 oz) IBW/kg (Calculated) : 66.1 Estimated Creatinine Clearance: 55.1 mL/min (by C-G formula based on SCr of 0.74 mg/dL).  Potassium (mmol/L)  Date Value  03/23/2024 4.3  02/04/2015 4.2   Magnesium (mg/dL)  Date Value  11/91/4782 2.1   Calcium (mg/dL)  Date Value  95/62/1308 8.1 (L)   Calcium, Total (mg/dL)  Date Value  65/78/4696 8.5   Albumin (g/dL)  Date Value  29/52/8413 2.1 (L)  02/04/2015 3.5   Phosphorus (mg/dL)  Date Value  24/40/1027 2.4 (L)   Sodium (mmol/L)  Date Value  03/23/2024 134 (L)  02/04/2015 141   Corrected Calcium: 8.92       (Ca 7.4,  albumin 2.1)   Assessment  [name] [name] is a 89 y.o. male presenting with a small bowel obstruction. PMH significant for severe PAD, systolic CHF secondary to ischemic cardiomyopathy, EF 35 to 40% 08/2023, CAD s/p CABG times 03/16/1998, and HTN. Pharmacy has been consulted to monitor and replace electrolytes.  Diet: full liquids MIVF: none  Goal of Therapy: Electrolytes WNL  Plan:  K 2.4: Kphos 500mg  PO x 2 Check electrolytes with AM labs  Thank you for allowing pharmacy to be a part of this patient's care.  Bettey Costa, PharmD 03/23/2024 7:46 AM

## 2024-03-23 NOTE — Progress Notes (Signed)
 CC: SBO s/p Ex-lap with reduction of volvulus, left RP Bleed Subjective: Patient at baseline today which is slightly confused to the time.  He denies any abdominal or nausea. He says that he has been passing flatus but no note of bowel movements Hgb has been okay and did not require transfusion yesterday.   Objective: Vital signs in last 24 hours: Temp:  [98.1 F (36.7 C)-98.6 F (37 C)] 98.6 F (37 C) (03/26 0400) Pulse Rate:  [79-97] 90 (03/26 0600) Resp:  [13-24] 14 (03/26 0700) BP: (101-129)/(49-64) 108/64 (03/26 0700) SpO2:  [86 %-96 %] 96 % (03/26 0600) Weight:  [68.8 kg] 68.8 kg (03/26 0500) Last BM Date : 03/21/24  Intake/Output from previous day: 03/25 0701 - 03/26 0700 In: 240 [P.O.:240] Out: 1500 [Urine:1500] Intake/Output this shift: No intake/output data recorded.  Physical exam:  Abdomen is soft, slightly distended and tympanic.  Some bruising around the midline staples.  No erythema.  Appropriately tender to palpation.  He is currently in A-fib. Swelling in right arm improved with wrapping.   Lab Results: CBC  Recent Labs    03/21/24 0329 03/21/24 1208 03/22/24 2005 03/23/24 0402  WBC 21.3*  --   --  15.5*  HGB 8.1*   < > 7.9* 8.1*  HCT 22.5*   < > 23.0* 23.6*  PLT 124*  --   --  151   < > = values in this interval not displayed.   BMET Recent Labs    03/22/24 0158 03/23/24 0402  NA 134* 134*  K 4.3 4.3  CL 105 106  CO2 21* 24  GLUCOSE 115* 97  BUN 19 17  CREATININE 1.09 0.74  CALCIUM 7.7* 8.1*   PT/INR No results for input(s): "LABPROT", "INR" in the last 72 hours. ABG No results for input(s): "PHART", "HCO3" in the last 72 hours.  Invalid input(s): "PCO2", "PO2"  Studies/Results: No results found.   Anti-infectives: Anti-infectives (From admission, onward)    Start     Dose/Rate Route Frequency Ordered Stop   03/15/24 0800  piperacillin-tazobactam (ZOSYN) IVPB 3.375 g        3.375 g 12.5 mL/hr over 240 Minutes Intravenous Every  8 hours 03/15/24 0314 03/19/24 0834   03/15/24 0215  piperacillin-tazobactam (ZOSYN) IVPB 3.375 g        3.375 g 100 mL/hr over 30 Minutes Intravenous  Once 03/15/24 0214 03/15/24 0310       Assessment/Plan:  Patient is postop day 8 from ex lap with reduction of small bowel volvulus for small bowel obstruction.  From a surgical standpoint doing well he is tolerating a full liquid diet although he has a poor appetite.  He had multiple bowel movements two days ago, none recorded yesterday.  He did develop a left RP bleed that I think is spontaneous given his anticoagulation.  Recommend spacing out CBC. Okay to advance to soft diet. Will add suppository today and bowel regimen.   Continue with Ensure nutritional supplementation.  Delirium precautions.  May benefit from additional lasix but will defer to primary team.    Baker Pierini, M.D. [facility]

## 2024-03-23 NOTE — Plan of Care (Signed)

## 2024-03-23 NOTE — Progress Notes (Signed)
 Progress Note   Patient: [name] [name]:[id number] DOB: 11-Apr-1929 DOA: 03/14/2024     8 DOS: the patient was seen and examined on 03/23/2024   Brief Narrative:    From admission h and p   [name] [name] is a 89 y.o. male with medical history significant for Severe PAD s/p multiple revascularization most recently 01/22/2024 for critical limb ischemia, and on chronic anticoagulation with Eliquis, systolic CHF secondary to ischemic cardiomyopathy, EF 35 to 40% 08/2023, CAD s/p CABG times 03/16/1998, HTN, who is being admitted with a small bowel obstruction, after presenting with nausea vomiting lower abdominal pain that started around noon on 3/17.  He had no diarrhea, no blood in the stool, no fever or chills.    Assessment & Plan:    # SBO s/p ex lap with lysis of adhesions 03/15/24.  Volvulus with hemorrhagic bowel encountered, no resection performed.  Bowel movement reported 3/23.  --On full liquid diet --Diet per general surgery  --Pain control PRN --Wound care  --F/u RD recommendations    # Delirium S/p surgery , mildly confused, intermittent hallucinations Continue delirium precaution   # Acute blood loss anemia # Hemorrhagic shock # Retroperitoneal hematoma LEFT Status post 3 units of blood transfusion --Stop heparin & avoid anticoagulation Monitor CBC closely   # SCC left neck --Wound care RN recs placed 3/19 For follow-up with oncology   # Ulcer left heel Chronic, followed by wound care as outpt, daughter says slowly improving -_Wound care RN recs placed 3/19 Continue offloading boots   # CAD Hx cabg x3, stable - no chest pain --Statin --Off ASA/Plavix due to bleeding   # Hypothyroid --Continue Synthroid   # Chronic HFrEF --Lasix and metoprolol on hold, requiring pressors   # Skin tear Right forearm, happened 3/21  --Appreciate wound care's recommendations Continue wound care   # A-fib HR's up in setting of hemorrhagic shock --Hold  metoprolol --Off heparin infusion for same --Telemetry --Replace electrolytes    # PAD S/p multiple vascular procedures most recently January of this year --Hold aspirin and anticoagulation for now Continue statin therapy   # T2DM Well controlled. Last A1c 5.1    # R flank ecchymosis  Appears superficial, no internal bleeding seen on CT of chest Daughter reports this due to a fall PTA. --Monitor   # Debility [facility] at [facility]. PT advises snf --TOC consulted Palliative care on board and we appreciate input   #Goals of care --Code status DNR/DNI --Palliative Care consulted Family is agreeable with discharge with hospice     DVT prophylaxis: none - due to retroperitoneal hematoma and hemorrhagic shock   Code Status: dnr/dni   Family Communication: Discussed with daughter and son-in-law at bedside   Consultants:  General Surgery Palliative Care   Procedures: Ex lap    Subjective:   Patient seen and examined at bedside this morning in the presence of the family Admits to improvement in respiratory function Currently on 4 L of intranasal oxygen Hide discussed with patient's daughter as well as son-in-law and they are in agreement with possible discharge with hospice They do not have services available 24/7 at home  Physical Exam    General exam: awake, drowsy, no acute distress, frail HEENT: moist mucus membranes, hearing grossly normal  Respiratory system: CTAB, no wheezes, rales or rhonchi, normal respiratory effort. Cardiovascular system: normal S1/S2  RRR, no lower extremity edema.   Gastrointestinal system: midline surgical incision appears well without signs of bleeding  or surrounding erythema swelling or other signs of infection Central nervous system: more alert & conversational, A&Ox2+ no gross focal neurologic deficits, normal speech Extremities: LUE edema, offloading boots on feet, no BLE edema Skin: color is improved, minimal ecchymosis of  LEFT flank, scattered ecchymosis of extremities      Data Reviewed:       Latest Ref Rng & Units 03/23/2024    4:02 AM 03/22/2024    1:58 AM 03/21/2024    3:29 AM  BMP  Glucose 70 - 99 mg/dL 97  098  119   BUN 8 - 23 mg/dL 17  19  15    Creatinine 0.61 - 1.24 mg/dL 1.47  8.29  5.62   Sodium 135 - 145 mmol/L 134  134  134   Potassium 3.5 - 5.1 mmol/L 4.3  4.3  4.8   Chloride 98 - 111 mmol/L 106  105  106   CO2 22 - 32 mmol/L 24  21  20    Calcium 8.9 - 10.3 mg/dL 8.1  7.7  7.4          Latest Ref Rng & Units 03/23/2024    4:02 AM 03/22/2024    8:05 PM 03/22/2024   10:00 AM  CBC  WBC 4.0 - 10.5 K/uL 15.5     Hemoglobin 13.0 - 17.0 g/dL 8.1  7.9  8.9   Hematocrit 39.0 - 52.0 % 23.6  23.0  26.6   Platelets 150 - 400 K/uL 151        Vitals:   03/23/24 0900 03/23/24 1000 03/23/24 1100 03/23/24 1729  BP: 110/63 (!) 112/57 (!) 112/53 (!) 115/54  Pulse: 90 88 87 67  Resp: 17 14 13 19   Temp:      TempSrc:      SpO2: 91% 93% 93% 99%  Weight:      Height:         Author: Loyce Dys, MD 03/23/2024 6:22 PM  For on call review [email or web address].

## 2024-03-23 NOTE — Plan of Care (Signed)
  Problem: Education: Goal: Knowledge of General Education information will improve Description: Including pain rating scale, medication(s)/side effects and non-pharmacologic comfort measures 03/23/2024 0003 by Dorma Russell, RN Outcome: Progressing 03/23/2024 0003 by Dorma Russell, RN Outcome: Progressing   Problem: Health Behavior/Discharge Planning: Goal: Ability to manage health-related needs will improve 03/23/2024 0003 by Dorma Russell, RN Outcome: Progressing 03/23/2024 0003 by Dorma Russell, RN Outcome: Progressing   Problem: Clinical Measurements: Goal: Ability to maintain clinical measurements within normal limits will improve 03/23/2024 0003 by Dorma Russell, RN Outcome: Progressing 03/23/2024 0003 by Dorma Russell, RN Outcome: Progressing Goal: Will remain free from infection 03/23/2024 0003 by Dorma Russell, RN Outcome: Progressing 03/23/2024 0003 by Dorma Russell, RN Outcome: Progressing Goal: Diagnostic test results will improve 03/23/2024 0003 by Dorma Russell, RN Outcome: Progressing 03/23/2024 0003 by Dorma Russell, RN Outcome: Progressing Goal: Respiratory complications will improve 03/23/2024 0003 by Dorma Russell, RN Outcome: Progressing 03/23/2024 0003 by Dorma Russell, RN Outcome: Progressing Goal: Cardiovascular complication will be avoided 03/23/2024 0003 by Dorma Russell, RN Outcome: Progressing 03/23/2024 0003 by Dorma Russell, RN Outcome: Progressing   Problem: Activity: Goal: Risk for activity intolerance will decrease 03/23/2024 0003 by Dorma Russell, RN Outcome: Progressing 03/23/2024 0003 by Dorma Russell, RN Outcome: Progressing   Problem: Nutrition: Goal: Adequate nutrition will be maintained 03/23/2024 0003 by Dorma Russell, RN Outcome: Progressing 03/23/2024 0003 by Dorma Russell, RN Outcome: Progressing   Problem: Coping: Goal: Level of anxiety will decrease 03/23/2024 0003 by Dorma Russell, RN Outcome:  Progressing 03/23/2024 0003 by Dorma Russell, RN Outcome: Progressing   Problem: Elimination: Goal: Will not experience complications related to bowel motility 03/23/2024 0003 by Dorma Russell, RN Outcome: Progressing 03/23/2024 0003 by Dorma Russell, RN Outcome: Progressing Goal: Will not experience complications related to urinary retention 03/23/2024 0003 by Dorma Russell, RN Outcome: Progressing 03/23/2024 0003 by Dorma Russell, RN Outcome: Progressing   Problem: Pain Managment: Goal: General experience of comfort will improve and/or be controlled 03/23/2024 0003 by Dorma Russell, RN Outcome: Progressing 03/23/2024 0003 by Dorma Russell, RN Outcome: Progressing   Problem: Safety: Goal: Ability to remain free from injury will improve 03/23/2024 0003 by Dorma Russell, RN Outcome: Progressing 03/23/2024 0003 by Dorma Russell, RN Outcome: Progressing   Problem: Skin Integrity: Goal: Risk for impaired skin integrity will decrease 03/23/2024 0003 by Dorma Russell, RN Outcome: Progressing 03/23/2024 0003 by Dorma Russell, RN Outcome: Progressing

## 2024-03-24 DIAGNOSIS — I5022 Chronic systolic (congestive) heart failure: Secondary | ICD-10-CM | POA: Diagnosis not present

## 2024-03-24 DIAGNOSIS — R112 Nausea with vomiting, unspecified: Secondary | ICD-10-CM | POA: Diagnosis not present

## 2024-03-24 DIAGNOSIS — R1030 Lower abdominal pain, unspecified: Secondary | ICD-10-CM | POA: Diagnosis not present

## 2024-03-24 DIAGNOSIS — K56609 Unspecified intestinal obstruction, unspecified as to partial versus complete obstruction: Secondary | ICD-10-CM | POA: Diagnosis not present

## 2024-03-24 LAB — CBC WITH DIFFERENTIAL/PLATELET
Abs Immature Granulocytes: 0.27 10*3/uL — ABNORMAL HIGH (ref 0.00–0.07)
Basophils Absolute: 0 10*3/uL (ref 0.0–0.1)
Basophils Relative: 0 %
Eosinophils Absolute: 0.3 10*3/uL (ref 0.0–0.5)
Eosinophils Relative: 2 %
HCT: 25 % — ABNORMAL LOW (ref 39.0–52.0)
Hemoglobin: 8.2 g/dL — ABNORMAL LOW (ref 13.0–17.0)
Immature Granulocytes: 2 %
Lymphocytes Relative: 8 %
Lymphs Abs: 1 10*3/uL (ref 0.7–4.0)
MCH: 29.6 pg (ref 26.0–34.0)
MCHC: 32.8 g/dL (ref 30.0–36.0)
MCV: 90.3 fL (ref 80.0–100.0)
Monocytes Absolute: 1.2 10*3/uL — ABNORMAL HIGH (ref 0.1–1.0)
Monocytes Relative: 10 %
Neutro Abs: 10.1 10*3/uL — ABNORMAL HIGH (ref 1.7–7.7)
Neutrophils Relative %: 78 %
Platelets: 185 10*3/uL (ref 150–400)
RBC: 2.77 MIL/uL — ABNORMAL LOW (ref 4.22–5.81)
RDW: 17.9 % — ABNORMAL HIGH (ref 11.5–15.5)
WBC: 12.9 10*3/uL — ABNORMAL HIGH (ref 4.0–10.5)
nRBC: 0.9 % — ABNORMAL HIGH (ref 0.0–0.2)

## 2024-03-24 LAB — PHOSPHORUS: Phosphorus: 3 mg/dL (ref 2.5–4.6)

## 2024-03-24 LAB — BASIC METABOLIC PANEL WITH GFR
Anion gap: 7 (ref 5–15)
BUN: 17 mg/dL (ref 8–23)
CO2: 27 mmol/L (ref 22–32)
Calcium: 7.8 mg/dL — ABNORMAL LOW (ref 8.9–10.3)
Chloride: 98 mmol/L (ref 98–111)
Creatinine, Ser: 0.68 mg/dL (ref 0.61–1.24)
GFR, Estimated: >60 mL/min (ref >60)
Glucose, Bld: 91 mg/dL (ref 70–99)
Potassium: 4 mmol/L (ref 3.5–5.1)
Sodium: 132 mmol/L — ABNORMAL LOW (ref 135–145)

## 2024-03-24 LAB — MAGNESIUM: Magnesium: 1.8 mg/dL (ref 1.7–2.4)

## 2024-03-24 MED ORDER — MAGNESIUM SULFATE 2 GM/50ML IV SOLN
2.0000 g | Freq: Once | INTRAVENOUS | Status: AC
  Administered 2024-03-24: 2 g via INTRAVENOUS
  Filled 2024-03-24: qty 50

## 2024-03-24 NOTE — Plan of Care (Signed)
Received report,

## 2024-03-24 NOTE — Progress Notes (Signed)
 Progress Note   Patient: [name] [name]:[id number] DOB: [birth year] DOA: 03/14/2024     9 DOS: the patient was seen and examined on 03/24/2024     Brief Narrative:    From admission h and p   [name] [name] is a 89 y.o. male with medical history significant for Severe PAD s/p multiple revascularization most recently 01/22/2024 for critical limb ischemia, and on chronic anticoagulation with Eliquis, systolic CHF secondary to ischemic cardiomyopathy, EF 35 to 40% 08/2023, CAD s/p CABG times 03/16/1998, HTN, who is being admitted with a small bowel obstruction, after presenting with nausea vomiting lower abdominal pain that started around noon on 3/17.  He had no diarrhea, no blood in the stool, no fever or chills.    Assessment & Plan:     # SBO s/p ex lap with lysis of adhesions 03/15/24.  Volvulus with hemorrhagic bowel encountered, no resection performed.  Bowel movement reported 3/23.  --Continue on full liquid diet Case discussed with surgery team --Pain control PRN Continue wound care --F/u RD recommendations    # Delirium S/p surgery , mildly confused, intermittent hallucinations Continue delirium precaution   # Acute blood loss anemia # Hemorrhagic shock # Retroperitoneal hematoma LEFT Status post 3 units of blood transfusion --Stop heparin & avoid anticoagulation Monitor CBC closely   # SCC left neck --Wound care RN recs placed 3/19 For follow-up with oncology   # Ulcer left heel Chronic, followed by wound care as outpt, daughter says slowly improving -_Wound care RN recs placed 3/19 Continue offloading boots   # CAD Hx cabg x3, stable - no chest pain --Statin --Off ASA/Plavix due to bleeding   # Hypothyroid --Continue Synthroid   # Chronic HFrEF --Lasix and metoprolol on hold, requiring pressors   # Skin tear Right forearm, happened 3/21  --Appreciate wound care's recommendations Continue wound care   # A-fib HR's up in setting of hemorrhagic  shock --Hold metoprolol --Off heparin infusion for same --Telemetry --Replace electrolytes    # PAD S/p multiple vascular procedures most recently January of this year --Hold aspirin and anticoagulation for now Continue statin therapy   # T2DM Well controlled. Last A1c 5.1    # R flank ecchymosis  Appears superficial, no internal bleeding seen on CT of chest Daughter reports this due to a fall PTA. --Monitor   # Debility [facility] at [facility]. PT advises snf --TOC consulted Palliative care on board and we appreciate input   #Goals of care --Code status DNR/DNI --Palliative Care consulted Family is agreeable with discharge with hospice     DVT prophylaxis: none - due to retroperitoneal hematoma and hemorrhagic shock   Code Status: dnr/dni   Family Communication: Discussed with daughter and son-in-law at bedside   Consultants:  General Surgery Palliative Care   Procedures: Ex lap     Subjective:   Patient seen and examined at bedside this morning in the presence of the daughter Denies worsening respiratory function chest pain cough Currently requiring 4 L of oxygen   Physical Exam    General exam: awake, drowsy, no acute distress, frail HEENT: moist mucus membranes, hearing grossly normal  Respiratory system: CTAB, no wheezes, rales or rhonchi, normal respiratory effort. Cardiovascular system: normal S1/S2  RRR, no lower extremity edema.   Gastrointestinal system: midline surgical incision appears well without signs of bleeding or surrounding erythema swelling or other signs of infection Central nervous system: more alert & conversational, A&Ox2+ no gross focal neurologic deficits,  normal speech Extremities: LUE edema, offloading boots on feet, no BLE edema Skin: color is improved, minimal ecchymosis of LEFT flank, scattered ecchymosis of extremities    Data Reviewed:   Vitals:   03/23/24 2109 03/24/24 0357 03/24/24 0858 03/24/24 1422  BP: (!)  112/54 (!) 119/56 (!) 103/44 (!) 120/56  Pulse: 88 83 86 85  Resp: 16  18 17   Temp: 98.1 F (36.7 C) 98 F (36.7 C) 97.9 F (36.6 C) 97.7 F (36.5 C)  TempSrc: Oral     SpO2: 100% 100% 98% 100%  Weight:      Height:          Latest Ref Rng & Units 03/24/2024    5:34 AM 03/23/2024    4:02 AM 03/22/2024    8:05 PM  CBC  WBC 4.0 - 10.5 K/uL 12.9  15.5    Hemoglobin 13.0 - 17.0 g/dL 8.2  8.1  7.9   Hematocrit 39.0 - 52.0 % 25.0  23.6  23.0   Platelets 150 - 400 K/uL 185  151         Latest Ref Rng & Units 03/24/2024    5:34 AM 03/23/2024    4:02 AM 03/22/2024    1:58 AM  BMP  Glucose 70 - 99 mg/dL 91  97  161   BUN 8 - 23 mg/dL 17  17  19    Creatinine 0.61 - 1.24 mg/dL 0.96  0.45  4.09   Sodium 135 - 145 mmol/L 132  134  134   Potassium 3.5 - 5.1 mmol/L 4.0  4.3  4.3   Chloride 98 - 111 mmol/L 98  106  105   CO2 22 - 32 mmol/L 27  24  21    Calcium 8.9 - 10.3 mg/dL 7.8  8.1  7.7      Author: Loyce Dys, MD 03/24/2024 6:03 PM  For on call review [email or web address].

## 2024-03-24 NOTE — Progress Notes (Signed)
 Physical Therapy Treatment Patient Details Name: [name] [name] MRN: [id number] DOB: 1929/05/07 Today's Date: 03/24/2024   History of Present Illness Pt admitted for SBO s/p exploratory laparotomy. Left RP bleed with transfer to ICU. PMHx of critical limb ischemia s/p femoral endarterectomy, CHF, CAD s/p CABG, on chronic anticoagulation, immunotherapy for recurrent squamous cell carcinoma.    PT Comments  Patient agreeable to PT session. He continues to require +2 person assistance for bed mobility. Sitting balance is poor initially with external support required for posterior lean that improves with cues for anterior weight shifting. Activity tolerance is limited by fatigue. Anticipate the need for +2 person for any out of bed activity, or using mechanical lift as indicated. PT will continue to follow to maximize independence and decrease caregiver burden.    If plan is discharge home, recommend the following: Two people to help with walking and/or transfers;A lot of help with bathing/dressing/bathroom;Help with stairs or ramp for entrance;Supervision due to cognitive status   Can travel by private vehicle     No  Equipment Recommendations  None recommended by PT    Recommendations for Other Services       Precautions / Restrictions Precautions Precautions: Fall Recall of Precautions/Restrictions: Impaired Restrictions Weight Bearing Restrictions Per Provider Order: No     Mobility  Bed Mobility Overal bed mobility: Needs Assistance Bed Mobility: Supine to Sit, Sit to Supine     Supine to sit: Max assist, +2 for physical assistance Sit to supine: Max assist, +2 for physical assistance   General bed mobility comments: verbal cues for task initiation and sequencing. increased time and effort required    Transfers                   General transfer comment: not attempted due to poor activity tolerance    Ambulation/Gait                   Stairs              Wheelchair Mobility     Tilt Bed    Modified Rankin (Stroke Patients Only)       Balance Overall balance assessment: Needs assistance Sitting-balance support: Single extremity supported, Feet unsupported, No upper extremity supported Sitting balance-Leahy Scale: Poor Sitting balance - Comments: poor initially with posterior lean. faciliation for anterior weight shifting. balance and posture improves with cues and patient progressed to close stnd by assistance Postural control: Posterior lean                                  Communication Communication Communication: Impaired Factors Affecting Communication: Difficulty expressing self;Hearing impaired  Cognition Arousal: Alert Behavior During Therapy: WFL for tasks assessed/performed   PT - Cognitive impairments: History of cognitive impairments                       PT - Cognition Comments: confused but cooperative Following commands: Impaired Following commands impaired: Follows one step commands with increased time (occasional multi modal cues required)    Cueing Cueing Techniques: Verbal cues, Tactile cues  Exercises General Exercises - Lower Extremity Long Arc Quad: AROM, Strengthening, Both, 5 reps, Seated, Limitations (through partial ROM (pain free))    General Comments        Pertinent Vitals/Pain Pain Assessment Pain Assessment: Faces Faces Pain Scale: Hurts little more Pain Location: L heel Pain Descriptors / Indicators:  Discomfort, Grimacing Pain Intervention(s): Limited activity within patient's tolerance, Monitored during session, Repositioned    Home Living                          Prior Function            PT Goals (current goals can now be found in the care plan section) Acute Rehab PT Goals Patient Stated Goal: to walk again PT Goal Formulation: With patient Time For Goal Achievement: 03/30/24 Potential to Achieve Goals: Fair Progress  towards PT goals: Progressing toward goals    Frequency    Min 2X/week      PT Plan      Co-evaluation PT/OT/SLP Co-Evaluation/Treatment: Yes (partial co-treatment) Reason for Co-Treatment: For patient/therapist safety;To address functional/ADL transfers PT goals addressed during session: Mobility/safety with mobility        AM-PAC PT "6 Clicks" Mobility   Outcome Measure  Help needed turning from your back to your side while in a flat bed without using bedrails?: A Little Help needed moving from lying on your back to sitting on the side of a flat bed without using bedrails?: Total Help needed moving to and from a bed to a chair (including a wheelchair)?: Total Help needed standing up from a chair using your arms (e.g., wheelchair or bedside chair)?: Total Help needed to walk in hospital room?: Total Help needed climbing 3-5 steps with a railing? : Total 6 Click Score: 8    End of Session Equipment Utilized During Treatment: Oxygen Activity Tolerance: Patient limited by fatigue;Patient tolerated treatment well Patient left: in bed;with call bell/phone within reach;with bed alarm set (with OT in the room) Nurse Communication: Mobility status PT Visit Diagnosis: Unsteadiness on feet (R26.81);Muscle weakness (generalized) (M62.81);History of falling (Z91.81);Difficulty in walking, not elsewhere classified (R26.2)     Time: 6962-9528 PT Time Calculation (min) (ACUTE ONLY): 21 min  Charges:    $Therapeutic Activity: 8-22 mins PT General Charges $$ ACUTE PT VISIT: 1 Visit                     Donna Bernard, PT, MPT    Ina Homes 03/24/2024, 2:16 PM

## 2024-03-24 NOTE — Progress Notes (Signed)
 Palliative Care Progress Note, Assessment & Plan   Patient Name: [name] [name]       Date: 03/24/2024 DOB: [birth year]  Age: 89 y.o. MRN#: [id number] Attending Physician: Loyce Dys, MD Primary Care Physician: [name], MD Admit Date: 03/14/2024  Subjective: Patient is sitting up in bed, awake and alert.  He acknowledges my presence and is able to make his wishes known.  He is eating ice cream and drinking milk.  No family or friends present during my visit.  HPI: 89 y.o. male  with past medical history of severe PAD s/p multiple revascularizations (most recent-01/22/2024 for critical limb ischemia) on chronic anticoagulation-Eliquis, systolic CHF secondary to ischemic cardiomyopathy (EF 35 to 40% on 08/2023), CAD s/p CABG x 3 (1999), and HTN admitted on 03/14/2024 with nausea and vomiting with lower abdominal pain.  Patient has been diagnosed with SBO.   3/18, patient underwent ISIS of adhesion.  Volvulus with hemorrhagic bowel encountered at but no resection performed.   3/23, CT of abdomen and pelvis revealed retroperitoneal hematoma active extravasation. Patient given 2 units RBCs.   Evening of 3/23, patient became hypotensive but asymptomatic.  Patient transferred to ICU.    PMT was consulted to support patient and family with goals of care discussions.   Summary of counseling/coordination of care: Extensive chart review completed prior to meeting patient including labs, vital signs, imaging, progress notes, orders, and available advanced directive documents from current and previous encounters.   After reviewing the patient's chart and assessing the patient at bedside, I spoke with patient in regards to symptom management and goals of care.   Symptoms discussed.  Patient has no acute  complaints at this time.  He denies chest pain, discomfort, N/V, or other acute issues at this time.  No adjustment to [facility] needed.  The patient is awake and alert, he is unable to participate in goals of care or medical decision making independently at this time.  After visiting with the patient, I spoke with patient's daughter [name] [name] over the phone.  Brief medical update given.  I attempted to elicit values and goals important to patient and [name] [name].  [name] [name] continues to say she does not want him to suffer or be in pain.  She visited earlier today.she was grateful that he was awake for some of the visit but that he slept the majority of the time.  Reviewed next steps and plans with [name] [name].  She shares that plan remains for patient to discharge hopefully back to [facility].  [name] [name] is hopeful that hospice services can follow her father there.    Hospice services, philosophy, and aging and placed discussed in detail.  We discussed that when hospice benefits are enacted, goals of care shift from treating the treatable to allowing patients to age in place with aggressive symptom management.   [name] [name] remained in agreement for discharge with hospice services to follow. Awaiting response from [facility] to see if patient can transfer back there.  Goals are clear.  Disposition is barrier to discharge.  TOC following closely.  PMT will step back from daily visits.  [name] [name] has PMT contact info and was advised to call with any acute palliative needs.  Please reengage with PMT if goals change, at patient/family's request, or patient's health deteriorates during hospitalization.  Physical Exam Vitals reviewed.  Constitutional:      Comments: Thin, frail  HENT:     Head:     Comments: Temporal wasting    Mouth/Throat:     Mouth: Mucous membranes are moist.  Eyes:     Pupils: Pupils are equal, round, and reactive to light.  Pulmonary:     Effort: Pulmonary effort is normal.  Abdominal:     Palpations:  Abdomen is soft.  Musculoskeletal:     Comments: MAETC, generalized weakness  Neurological:     Mental Status: He is alert.     Comments: Oriented to self and place  Psychiatric:        Behavior: Behavior normal.             Total Time 35 minutes   Time spent includes: Detailed review of medical records (labs, imaging, vital signs), medically appropriate exam (mental status, respiratory, cardiac, skin), discussed with treatment team, counseling and educating patient, family and staff, documenting clinical information, medication management and coordination of care.  Samara Deist L. Bonita Quin, DNP, FNP-BC Palliative Medicine Team

## 2024-03-24 NOTE — Progress Notes (Signed)
 Occupational Therapy Treatment Patient Details Name: [name] [name] MRN: [id number] DOB: February 23, 1929 Today's Date: 03/24/2024   History of present illness Pt admitted for SBO s/p exploratory laparotomy. Left RP bleed with transfer to ICU. PMHx of critical limb ischemia s/p femoral endarterectomy, CHF, CAD s/p CABG, on chronic anticoagulation, immunotherapy for recurrent squamous cell carcinoma.   OT comments  Today's session conducted in part as OT/PT co-tx. Pt is very pleasant and agreeable to participating in session. He denies pain, including with movement and with touching/repositioning feet. Pt requires Mod A +2 assist for bed mobility, with support for moving LE and for supporting trunk. With verbal cueing, he is able to use his L UE to reach for and pull up on bed railing. Initially pt requires Min-Mod A for maintaining sitting balance but eventually able to support self for several minutes. Activity tolerance is limited by fatigue, unable to attempt standing or OOB movement this day. Will continue to follow PoC.      If plan is discharge home, recommend the following:  A lot of help with bathing/dressing/bathroom;Help with stairs or ramp for entrance;Two people to help with walking and/or transfers   Equipment Recommendations       Recommendations for Other Services      Precautions / Restrictions Precautions Precautions: Fall Restrictions Weight Bearing Restrictions Per Provider Order: No       Mobility Bed Mobility Overal bed mobility: Needs Assistance Bed Mobility: Supine to Sit, Sit to Supine     Supine to sit: Max assist, +2 for physical assistance Sit to supine: Max assist, +2 for physical assistance        Transfers                   General transfer comment: not attempted due to poor activity tolerance     Balance Overall balance assessment: Needs assistance Sitting-balance support: Single extremity supported, Feet unsupported, No upper extremity  supported Sitting balance-Leahy Scale: Poor Sitting balance - Comments: poor initially with posterior lean. faciliation for anterior weight shifting. balance and posture improves with cues and patient progressed to close stnd by assistance                                   ADL either performed or assessed with clinical judgement   ADL Overall ADL's : Needs assistance/impaired Eating/Feeding: Set up;Minimal assistance Eating/Feeding Details (indicate cue type and reason): Pt requires assistance with opening containers, locating items on food tray. After that, able to feed self.   Grooming Details (indicate cue type and reason): able to wipe his nose with set up assist             Lower Body Dressing: Maximal assistance Lower Body Dressing Details (indicate cue type and reason): to remove/replace protective boots                    Extremity/Trunk Assessment Upper Extremity Assessment Upper Extremity Assessment: Generalized weakness   Lower Extremity Assessment Lower Extremity Assessment: Generalized weakness        Vision       Perception     Praxis     Communication Communication Communication: Impaired Factors Affecting Communication: Difficulty expressing self;Hearing impaired   Cognition Arousal: Alert Behavior During Therapy: [facility] for tasks assessed/performed  Cueing      Exercises      Shoulder Instructions       General Comments      Pertinent Vitals/ Pain       Pain Assessment Pain Assessment: No/denies pain  Home Living                                          Prior Functioning/Environment              Frequency  Min 2X/week        Progress Toward Goals  OT Goals(current goals can now be found in the care plan section)  Progress towards OT goals: Progressing toward goals     Plan      Co-evaluation    PT/OT/SLP  Co-Evaluation/Treatment: Yes ((partial co-tx)) Reason for Co-Treatment: For patient/therapist safety;To address functional/ADL transfers PT goals addressed during session: Mobility/safety with mobility OT goals addressed during session: ADL's and self-care      AM-PAC OT "6 Clicks" Daily Activity     Outcome Measure   Help from another person eating meals?: A Little Help from another person taking care of personal grooming?: A Little Help from another person toileting, which includes using toliet, bedpan, or urinal?: A Lot Help from another person bathing (including washing, rinsing, drying)?: A Lot Help from another person to put on and taking off regular upper body clothing?: A Little Help from another person to put on and taking off regular lower body clothing?: A Lot 6 Click Score: 15    End of Session    OT Visit Diagnosis: Other abnormalities of gait and mobility (R26.89);Unsteadiness on feet (R26.81);Muscle weakness (generalized) (M62.81)   Activity Tolerance Patient limited by fatigue;Patient tolerated treatment well   Patient Left with call bell/phone within reach;in bed;with bed alarm set   Nurse Communication          Time: 4098-1191 OT Time Calculation (min): 21 min  Charges: OT General Charges $OT Visit: 1 Visit OT Treatments $Self Care/Home Management : 8-22 mins  Latina Craver, PhD, MS, OTR/L 03/24/24, 2:45 PM

## 2024-03-24 NOTE — TOC Progression Note (Signed)
 Transition of Care [facility]) - Progression Note    Patient Details  Name: [name] [name] MRN: [id number] Date of Birth: 1929/10/20  [facility]) CM/SW Contact  Chapman Fitch, RN Phone Number: 03/24/2024, 2:16 PM  Clinical Narrative:     Attempted to reach [facility] at [facility] to arrange an assessment.  Went straight to VM x3 Call placed to main number, was transferred and message left requesting a return call       Expected Discharge Plan and Services                                               Social Determinants of Health (SDOH) Interventions SDOH Screenings   Food Insecurity: No Food Insecurity (03/15/2024)  Housing: Low Risk  (03/15/2024)  Transportation Needs: No Transportation Needs (03/15/2024)  Utilities: Not At Risk (03/15/2024)  Depression (PHQ2-9): Low Risk  (02/09/2024)  Financial Resource Strain: Low Risk  (09/22/2023)   Received from [facility]  Social Connections: Moderately Isolated (03/15/2024)  Tobacco Use: Low Risk  (03/15/2024)    Readmission Risk Interventions     No data to display

## 2024-03-24 NOTE — Consult Note (Signed)
 PHARMACY CONSULT NOTE - ELECTROLYTES  Pharmacy Consult for Electrolyte Monitoring and Replacement   Recent Labs: Height: 5\' 7"  (170.2 cm) Weight: 68.8 kg (151 lb 10.8 oz) IBW/kg (Calculated) : 66.1 Estimated Creatinine Clearance: 55.1 mL/min (by C-G formula based on SCr of 0.68 mg/dL).  Potassium (mmol/L)  Date Value  03/24/2024 4.0  02/04/2015 4.2   Magnesium (mg/dL)  Date Value  40/98/1191 1.8   Calcium (mg/dL)  Date Value  47/82/9562 7.8 (L)   Calcium, Total (mg/dL)  Date Value  13/07/6577 8.5   Albumin (g/dL)  Date Value  46/96/2952 2.1 (L)  02/04/2015 3.5   Phosphorus (mg/dL)  Date Value  84/13/2440 3.0   Sodium (mmol/L)  Date Value  03/24/2024 132 (L)  02/04/2015 141   Assessment  [name] [name] is a 89 y.o. male presenting with a small bowel obstruction. PMH significant for severe PAD, systolic CHF secondary to ischemic cardiomyopathy, EF 35 to 40% 08/2023, CAD s/p CABG times 03/16/1998, and HTN. Pharmacy has been consulted to monitor and replace electrolytes.  Diet: full liquids MIVF: none  Goal of Therapy: Electrolytes WNL  Plan:  Mg 1.8: Give magnesium sulfate 2 g IV x1 Check electrolytes with AM labs  Thank you for involving pharmacy in this patient's care.   Rockwell Alexandria, PharmD Clinical Pharmacist 03/24/2024 7:45 AM

## 2024-03-24 NOTE — Progress Notes (Signed)
 [facility] SURGICAL PROGRESS NOTE  Hospital Day(s): 9.   Post op day(s): 9 Days Post-Op.   Interval History:  Patient seen and examined No issues overnight Patient sitting up in bed; coughing No abdominal complaints No fever, chills, nausea, emesis Hemodynamically very stable this AM Leukocytosis improved; WBC 12.9K Hgb very stable in last 24 hours; 8.2 this AM Renal function normal; sCr - 0.68; UO - 2500 ccs Mild hyponatremia to 132 o/w no electrolyte derangements He is on soft diet; tolerating Bowel function recorded Palliative medicine following; ? Discharge with hospice services per note review   Vital signs in last 24 hours: [min-max] current  Temp:  [98 F (36.7 C)-98.1 F (36.7 C)] 98 F (36.7 C) (03/27 0357) Pulse Rate:  [67-90] 83 (03/27 0357) Resp:  [13-19] 16 (03/26 2109) BP: (110-119)/(53-63) 119/56 (03/27 0357) SpO2:  [91 %-100 %] 100 % (03/27 0357)     Height: 5\' 7"  (170.2 cm) Weight: 68.8 kg BMI (Calculated): 23.75   Intake/Output last 2 shifts:  03/26 0701 - 03/27 0700 In: -  Out: 2501 [Urine:2500; Stool:1]   Physical Exam:  Constitutional: alert, cooperative and no distress  Respiratory: breathing non-labored at rest; on [place] Gastrointestinal: soft, non-tender, non-distended, no rebound/guarding  Integumentary: Laparotomy is CDI with staples, ecchymotic expectedly no erythema or drainage   Labs:     Latest Ref Rng & Units 03/24/2024    5:34 AM 03/23/2024    4:02 AM 03/22/2024    8:05 PM  CBC  WBC 4.0 - 10.5 K/uL 12.9  15.5    Hemoglobin 13.0 - 17.0 g/dL 8.2  8.1  7.9   Hematocrit 39.0 - 52.0 % 25.0  23.6  23.0   Platelets 150 - 400 K/uL 185  151        Latest Ref Rng & Units 03/24/2024    5:34 AM 03/23/2024    4:02 AM 03/22/2024    1:58 AM  CMP  Glucose 70 - 99 mg/dL 91  97  347   BUN 8 - 23 mg/dL 17  17  19    Creatinine 0.61 - 1.24 mg/dL 4.25  9.56  3.87   Sodium 135 - 145 mmol/L 132  134  134   Potassium 3.5 - 5.1 mmol/L  4.0  4.3  4.3   Chloride 98 - 111 mmol/L 98  106  105   CO2 22 - 32 mmol/L 27  24  21    Calcium 8.9 - 10.3 mg/dL 7.8  8.1  7.7      Imaging studies: No new pertinent imaging studies   Assessment/Plan: 89 y.o. male 9 Days Post-Op s/p exploratory laparotomy and reduction of small bowel volvulus with hemorrhagic/edematous but viable bowel, complicated by pertinent comorbidities including advanced age, deconditioning, significant cardiac history on Eliquis, and SCC currently on immunotherapy.    - Okay to continue soft diet  - Continue gentle bowel regimen; colace & senokot  - Monitor abdominal examination; on-going bowel function  - Monitor H&H; stable - no signs of active bleeding  - No indication for surgical re-intervention  - Can remove staples on POD14  - Appreciate palliative medicine assistance/recommendations  - Mobilize with therapies as feasible  - Further management per primary service  All of the above findings and recommendations were discussed with the patient, and the medical team, and all of patient's questions were answered to his expressed satisfaction.  -- Lynden Oxford, PA-C [facility] 03/24/2024, 8:45 AM M-F: 7am - 4pm

## 2024-03-25 DIAGNOSIS — K56609 Unspecified intestinal obstruction, unspecified as to partial versus complete obstruction: Secondary | ICD-10-CM | POA: Diagnosis not present

## 2024-03-25 LAB — CBC WITH DIFFERENTIAL/PLATELET
Abs Immature Granulocytes: 0.15 10*3/uL — ABNORMAL HIGH (ref 0.00–0.07)
Basophils Absolute: 0 10*3/uL (ref 0.0–0.1)
Basophils Relative: 0 %
Eosinophils Absolute: 0.2 10*3/uL (ref 0.0–0.5)
Eosinophils Relative: 2 %
HCT: 22.2 % — ABNORMAL LOW (ref 39.0–52.0)
Hemoglobin: 7.5 g/dL — ABNORMAL LOW (ref 13.0–17.0)
Immature Granulocytes: 1 %
Lymphocytes Relative: 6 %
Lymphs Abs: 0.7 10*3/uL (ref 0.7–4.0)
MCH: 30.5 pg (ref 26.0–34.0)
MCHC: 33.8 g/dL (ref 30.0–36.0)
MCV: 90.2 fL (ref 80.0–100.0)
Monocytes Absolute: 1.3 10*3/uL — ABNORMAL HIGH (ref 0.1–1.0)
Monocytes Relative: 11 %
Neutro Abs: 9.5 10*3/uL — ABNORMAL HIGH (ref 1.7–7.7)
Neutrophils Relative %: 80 %
Platelets: 195 10*3/uL (ref 150–400)
RBC: 2.46 MIL/uL — ABNORMAL LOW (ref 4.22–5.81)
RDW: 17.6 % — ABNORMAL HIGH (ref 11.5–15.5)
WBC: 11.8 10*3/uL — ABNORMAL HIGH (ref 4.0–10.5)
nRBC: 0.5 % — ABNORMAL HIGH (ref 0.0–0.2)

## 2024-03-25 LAB — BASIC METABOLIC PANEL WITH GFR
Anion gap: 5 (ref 5–15)
BUN: 16 mg/dL (ref 8–23)
CO2: 29 mmol/L (ref 22–32)
Calcium: 7.9 mg/dL — ABNORMAL LOW (ref 8.9–10.3)
Chloride: 98 mmol/L (ref 98–111)
Creatinine, Ser: 0.6 mg/dL — ABNORMAL LOW (ref 0.61–1.24)
GFR, Estimated: >60 mL/min (ref >60)
Glucose, Bld: 93 mg/dL (ref 70–99)
Potassium: 3.9 mmol/L (ref 3.5–5.1)
Sodium: 132 mmol/L — ABNORMAL LOW (ref 135–145)

## 2024-03-25 LAB — MAGNESIUM: Magnesium: 2.1 mg/dL (ref 1.7–2.4)

## 2024-03-25 NOTE — Consult Note (Signed)
 PHARMACY CONSULT NOTE - ELECTROLYTES  Pharmacy Consult for Electrolyte Monitoring and Replacement   Recent Labs: Height: 5\' 7"  (170.2 cm) Weight: 68.3 kg (150 lb 9.2 oz) IBW/kg (Calculated) : 66.1 Estimated Creatinine Clearance: 55.1 mL/min (A) (by C-G formula based on SCr of 0.6 mg/dL (L)).  Potassium (mmol/L)  Date Value  03/25/2024 3.9  02/04/2015 4.2   Magnesium (mg/dL)  Date Value  16/09/9603 2.1   Calcium (mg/dL)  Date Value  54/08/8118 7.9 (L)   Calcium, Total (mg/dL)  Date Value  14/78/2956 8.5   Albumin (g/dL)  Date Value  21/30/8657 2.1 (L)  02/04/2015 3.5   Phosphorus (mg/dL)  Date Value  84/69/6295 3.0   Sodium (mmol/L)  Date Value  03/25/2024 132 (L)  02/04/2015 141   Assessment  [name] [name] is a 89 y.o. male presenting with a small bowel obstruction. PMH significant for severe PAD, systolic CHF secondary to ischemic cardiomyopathy, EF 35 to 40% 08/2023, CAD s/p CABG times 03/16/1998, and HTN. Pharmacy has been consulted to monitor and replace electrolytes.  Diet: full liquids MIVF: none  Goal of Therapy: Electrolytes WNL  Plan:  No electrolyte replacement indicated for today Check electrolytes with AM labs  Thank you for involving pharmacy in this patient's care.   Rockwell Alexandria, PharmD Clinical Pharmacist 03/25/2024 8:33 AM

## 2024-03-25 NOTE — Telephone Encounter (Signed)
 The daughter called and sais that he is still in hospital and the the appts for next week will need to be cancelled and to call wanda back about this

## 2024-03-25 NOTE — Care Management Important Message (Signed)
 Important Message  Patient Details  Name: [name] [name] MRN: [id number] Date of Birth: 1929-10-31   Important Message Given:  Yes - Medicare IM     Hayli Milligan, Stephan Minister 03/25/2024, 10:33 AM

## 2024-03-25 NOTE — Progress Notes (Signed)
 Progress Note   Patient: [name] [name] XBJ:[id number] DOB: January 30, 1929 DOA: 03/14/2024     10 DOS: the patient was seen and examined on 03/25/2024    Brief Narrative:    From admission h and p   [name] [name] is a 89 y.o. male with medical history significant for Severe PAD s/p multiple revascularization most recently 01/22/2024 for critical limb ischemia, and on chronic anticoagulation with Eliquis, systolic CHF secondary to ischemic cardiomyopathy, EF 35 to 40% 08/2023, CAD s/p CABG times 03/16/1998, HTN, who is being admitted with a small bowel obstruction, after presenting with nausea vomiting lower abdominal pain that started around noon on 3/17.  He had no diarrhea, no blood in the stool, no fever or chills.    Assessment & Plan:     # SBO s/p ex lap with lysis of adhesions 03/15/24.  Volvulus with hemorrhagic bowel encountered, no resection performed.  Bowel movement reported 3/23.  --Continue on full liquid diet I discussed with surgery team today Patient surgically cleared for discharge --Pain control PRN Continue wound care --F/u RD recommendations    # Delirium S/p surgery , mildly confused, intermittent hallucinations Continue delirium precaution   # Acute blood loss anemia # Hemorrhagic shock # Retroperitoneal hematoma LEFT Status post 3 units of blood transfusion --Stop heparin & avoid anticoagulation Monitor CBC   # SCC left neck --Wound care RN recs placed 3/19 For follow-up with oncology   # Ulcer left heel Chronic, followed by wound care as outpt, daughter says slowly improving -_Wound care RN recs placed 3/19 Continue offloading boots   Hyponatremia Monitor sodium level closely  # CAD Hx cabg x3, stable - no chest pain Continue statin therapy --Off ASA/Plavix due to bleeding   # Hypothyroid Continue Synthroid   # Chronic HFrEF --Lasix and metoprolol on hold, requiring pressors   # Skin tear Right forearm, happened 3/21  --Appreciate  wound care's recommendations Continue wound care   # A-fib HR's up in setting of hemorrhagic shock --Hold metoprolol --Off heparin infusion for same --Telemetry --Replace electrolytes    # PAD S/p multiple vascular procedures most recently January of this year --Hold aspirin and anticoagulation for now Continue statin therapy   # T2DM Well controlled. Last A1c 5.1    # R flank ecchymosis  Appears superficial, no internal bleeding seen on CT of chest Daughter reports this due to a fall PTA. --Monitor   # Debility Resides at [facility]. PT advises snf --TOC consulted Palliative care on board and we appreciate input   #Goals of care --Code status DNR/DNI --Palliative Care consulted Family is agreeable with discharge with hospice     DVT prophylaxis: none - due to retroperitoneal hematoma and hemorrhagic shock   Code Status: dnr/dni   Family Communication: Discussed with daughter and son-in-law at bedside   Consultants:  General Surgery Palliative Care   Procedures: Ex lap     Subjective:   Patient seen and examined at bedside today Currently on 2.5 L of oxygen He tells me his respiratory function has improved Denies nausea vomiting abdominal pain   Physical Exam    General exam: awake, drowsy, no acute distress, frail HEENT: moist mucus membranes, hearing grossly normal  Respiratory system: CTAB, no wheezes, rales or rhonchi, normal respiratory effort. Cardiovascular system: normal S1/S2  RRR, no lower extremity edema.   Gastrointestinal system: midline surgical incision appears well without signs of bleeding or surrounding erythema swelling or other signs of infection Central nervous  system: more alert & conversational, A&Ox2+ no gross focal neurologic deficits, normal speech Extremities: LUE edema, offloading boots on feet, no BLE edema Skin: color is improved, minimal ecchymosis of LEFT flank, scattered ecchymosis of extremities     Data  Reviewed:       Latest Ref Rng & Units 03/25/2024    5:00 AM 03/24/2024    5:34 AM 03/23/2024    4:02 AM  CBC  WBC 4.0 - 10.5 K/uL 11.8  12.9  15.5   Hemoglobin 13.0 - 17.0 g/dL 7.5  8.2  8.1   Hematocrit 39.0 - 52.0 % 22.2  25.0  23.6   Platelets 150 - 400 K/uL 195  185  151        Latest Ref Rng & Units 03/25/2024    5:00 AM 03/24/2024    5:34 AM 03/23/2024    4:02 AM  BMP  Glucose 70 - 99 mg/dL 93  91  97   BUN 8 - 23 mg/dL 16  17  17    Creatinine 0.61 - 1.24 mg/dL 8.29  5.62  1.30   Sodium 135 - 145 mmol/L 132  132  134   Potassium 3.5 - 5.1 mmol/L 3.9  4.0  4.3   Chloride 98 - 111 mmol/L 98  98  106   CO2 22 - 32 mmol/L 29  27  24    Calcium 8.9 - 10.3 mg/dL 7.9  7.8  8.1      Vitals:   03/25/24 0211 03/25/24 0500 03/25/24 0800 03/25/24 1158  BP: (!) 117/52  (!) 115/58 (!) 110/51  Pulse: 74  87 85  Resp: 18     Temp: 98.3 F (36.8 C)  98 F (36.7 C) 98 F (36.7 C)  TempSrc: Oral  Oral Oral  SpO2: 100%  94% 99%  Weight:  68.3 kg    Height:         Author: Loyce Dys, MD 03/25/2024 4:38 PM  For on call review [email or web address].

## 2024-03-25 NOTE — TOC Progression Note (Signed)
 Transition of Care [facility]) - Progression Note    Patient Details  Name: JUNIE ENGRAM MRN: [id number] Date of Birth: [birth year]  [facility] CM/SW Contact  Chapman Fitch, RN Phone Number: 03/25/2024, 11:21 AM  Clinical Narrative:        Vm left for Aleah at [facility] requesting a return call  Sp[facility] with [facility].  Clinical secure emailed for her to review.  She will notify TOC if they can meet patient's needs or if he will require SNF prior to return     Expected Discharge Plan and Services                                               Social Determinants of Health (SDOH) Interventions SDOH Screenings   Food Insecurity: No Food Insecurity (03/15/2024)  Housing: Low Risk  (03/15/2024)  Transportation Needs: No Transportation Needs (03/15/2024)  Utilities: Not At Risk (03/15/2024)  Depression (PHQ2-9): Low Risk  (02/09/2024)  Financial Resource Strain: Low Risk  (09/22/2023)   Received from [facility]  Social Connections: Moderately Isolated (03/15/2024)  Tobacco Use: Low Risk  (03/15/2024)    Readmission Risk Interventions     No data to display

## 2024-03-26 DIAGNOSIS — K56609 Unspecified intestinal obstruction, unspecified as to partial versus complete obstruction: Secondary | ICD-10-CM | POA: Diagnosis not present

## 2024-03-26 LAB — CBC WITH DIFFERENTIAL/PLATELET
Abs Immature Granulocytes: 0.12 10*3/uL — ABNORMAL HIGH (ref 0.00–0.07)
Basophils Absolute: 0 10*3/uL (ref 0.0–0.1)
Basophils Relative: 0 %
Eosinophils Absolute: 0.2 10*3/uL (ref 0.0–0.5)
Eosinophils Relative: 2 %
HCT: 24.4 % — ABNORMAL LOW (ref 39.0–52.0)
Hemoglobin: 7.9 g/dL — ABNORMAL LOW (ref 13.0–17.0)
Immature Granulocytes: 1 %
Lymphocytes Relative: 8 %
Lymphs Abs: 1.1 10*3/uL (ref 0.7–4.0)
MCH: 29.9 pg (ref 26.0–34.0)
MCHC: 32.4 g/dL (ref 30.0–36.0)
MCV: 92.4 fL (ref 80.0–100.0)
Monocytes Absolute: 1.4 10*3/uL — ABNORMAL HIGH (ref 0.1–1.0)
Monocytes Relative: 11 %
Neutro Abs: 10 10*3/uL — ABNORMAL HIGH (ref 1.7–7.7)
Neutrophils Relative %: 78 %
Platelets: 221 10*3/uL (ref 150–400)
RBC: 2.64 MIL/uL — ABNORMAL LOW (ref 4.22–5.81)
RDW: 18.1 % — ABNORMAL HIGH (ref 11.5–15.5)
WBC: 12.8 10*3/uL — ABNORMAL HIGH (ref 4.0–10.5)
nRBC: 0.2 % (ref 0.0–0.2)

## 2024-03-26 LAB — BASIC METABOLIC PANEL WITH GFR
Anion gap: 8 (ref 5–15)
BUN: 17 mg/dL (ref 8–23)
CO2: 27 mmol/L (ref 22–32)
Calcium: 7.9 mg/dL — ABNORMAL LOW (ref 8.9–10.3)
Chloride: 95 mmol/L — ABNORMAL LOW (ref 98–111)
Creatinine, Ser: 0.55 mg/dL — ABNORMAL LOW (ref 0.61–1.24)
GFR, Estimated: >60 mL/min (ref >60)
Glucose, Bld: 90 mg/dL (ref 70–99)
Potassium: 4.1 mmol/L (ref 3.5–5.1)
Sodium: 130 mmol/L — ABNORMAL LOW (ref 135–145)

## 2024-03-26 LAB — MAGNESIUM: Magnesium: 2.1 mg/dL (ref 1.7–2.4)

## 2024-03-26 LAB — PHOSPHORUS: Phosphorus: 2.3 mg/dL — ABNORMAL LOW (ref 2.5–4.6)

## 2024-03-26 MED ORDER — K PHOS MONO-SOD PHOS DI & MONO 155-852-130 MG PO TABS
500.0000 mg | ORAL_TABLET | ORAL | Status: AC
  Administered 2024-03-26 (×2): 500 mg via ORAL
  Filled 2024-03-26 (×2): qty 2

## 2024-03-26 MED ORDER — POTASSIUM CHLORIDE CRYS ER 20 MEQ PO TBCR: 40.0000 meq | EXTENDED_RELEASE_TABLET | Freq: Once | ORAL | Status: DC

## 2024-03-26 NOTE — Plan of Care (Signed)

## 2024-03-26 NOTE — Plan of Care (Signed)

## 2024-03-26 NOTE — Progress Notes (Signed)
 Progress Note   Patient: [name] [name]:[id number] DOB: [birth year] DOA: 03/14/2024     11 DOS: the patient was seen and examined on 03/26/2024     Brief Narrative:    From admission h and p   [name] [name] is a 89 y.o. male with medical history significant for Severe PAD s/p multiple revascularization most recently 01/22/2024 for critical limb ischemia, and on chronic anticoagulation with Eliquis, systolic CHF secondary to ischemic cardiomyopathy, EF 35 to 40% 08/2023, CAD s/p CABG times 03/16/1998, HTN, who is being admitted with a small bowel obstruction, after presenting with nausea vomiting lower abdominal pain that started around noon on 3/17.  He had no diarrhea, no blood in the stool, no fever or chills.    Assessment & Plan:     # SBO s/p ex lap with lysis of adhesions 03/15/24.  Volvulus with hemorrhagic bowel encountered, no resection performed.  Bowel movement reported 3/23.  --Continue on full liquid diet I discussed with surgery team today Patient surgically cleared for discharge --Pain control PRN Continue wound care   # Delirium S/p surgery , mildly confused, intermittent hallucinations Continue delirium precaution   # Acute blood loss anemia # Hemorrhagic shock # Retroperitoneal hematoma LEFT Status post 3 units of blood transfusion --Stop heparin & avoid anticoagulation Monitor CBC   # SCC left neck --Wound care RN recs placed 3/19 Outpatient follow-up with oncology   # Ulcer left heel Chronic, followed by wound care as outpt, daughter says slowly improving -_Wound care RN recs placed 3/19 Continue offloading boots   Hyponatremia Monitor sodium level closely   # CAD Hx cabg x3, stable - no chest pain Continue statin therapy --Off ASA/Plavix due to bleeding   # Hypothyroid Continue Synthroid   # Chronic HFrEF --Lasix and metoprolol on hold, requiring pressors   # Skin tear Right forearm, happened 3/21  --Appreciate wound care's  recommendations Continue wound care   # A-fib HR's up in setting of hemorrhagic shock --Hold metoprolol --Off heparin infusion for same --Telemetry Keep potassium greater than 4   # PAD S/p multiple vascular procedures most recently January of this year --Hold aspirin and anticoagulation for now Continue statin therapy   # T2DM Well controlled. Last A1c 5.1    # R flank ecchymosis  Appears superficial, no internal bleeding seen on CT of chest Daughter reports this due to a fall PTA. --Monitor   # Debility [facility] at [facility]. PT advises snf --TOC consulted Palliative care on board and we appreciate input   #Goals of care --Code status DNR/DNI --Palliative Care consulted Family is agreeable with discharge with hospice     DVT prophylaxis: none - due to retroperitoneal hematoma and hemorrhagic shock   Code Status: dnr/dni   Family Communication: Discussed with daughter and son-in-law at bedside   Consultants:  General Surgery Palliative Care   Procedures: Ex lap     Subjective:   Patient seen and examined at bedside today Patient still on 3 L of oxygen Denies nausea vomiting abdominal pain TOC working on placement   Physical Exam    General exam: awake, drowsy, no acute distress, frail HEENT: moist mucus membranes, hearing grossly normal  Respiratory system: CTAB, no wheezes, rales or rhonchi, normal respiratory effort. Cardiovascular system: normal S1/S2  RRR, no lower extremity edema.   Gastrointestinal system: midline surgical incision appears well without signs of bleeding or surrounding erythema swelling or other signs of infection Central nervous system: more alert &  conversational, A&Ox2+ no gross focal neurologic deficits, normal speech Extremities: LUE edema, offloading boots on feet, no BLE edema Skin: color is improved, minimal ecchymosis of LEFT flank, scattered ecchymosis of extremities     Data Reviewed:      Vitals:   03/25/24  1158 03/25/24 1957 03/26/24 0338 03/26/24 0924  BP: (!) 110/51 (!) 107/54 111/60 (!) 116/50  Pulse: 85 78 82 83  Resp:  18 20   Temp: 98 F (36.7 C) 99.1 F (37.3 C) 98.7 F (37.1 C) 97.9 F (36.6 C)  TempSrc: Oral Oral Oral   SpO2: 99% 99% 97% 100%  Weight:      Height:          Latest Ref Rng & Units 03/26/2024    4:56 AM 03/25/2024    5:00 AM 03/24/2024    5:34 AM  CBC  WBC 4.0 - 10.5 K/uL 12.8  11.8  12.9   Hemoglobin 13.0 - 17.0 g/dL 7.9  7.5  8.2   Hematocrit 39.0 - 52.0 % 24.4  22.2  25.0   Platelets 150 - 400 K/uL 221  195  185        Latest Ref Rng & Units 03/26/2024    4:56 AM 03/25/2024    5:00 AM 03/24/2024    5:34 AM  BMP  Glucose 70 - 99 mg/dL 90  93  91   BUN 8 - 23 mg/dL 17  16  17    Creatinine 0.61 - 1.24 mg/dL 1.91  4.78  2.95   Sodium 135 - 145 mmol/L 130  132  132   Potassium 3.5 - 5.1 mmol/L 4.1  3.9  4.0   Chloride 98 - 111 mmol/L 95  98  98   CO2 22 - 32 mmol/L 27  29  27    Calcium 8.9 - 10.3 mg/dL 7.9  7.9  7.8      Author: Loyce Dys, MD 03/26/2024 3:57 PM  For on call review [email or web address].

## 2024-03-26 NOTE — Consult Note (Addendum)
 PHARMACY CONSULT NOTE - ELECTROLYTES  Pharmacy Consult for Electrolyte Monitoring and Replacement   Recent Labs: Height: 5\' 7"  (170.2 cm) Weight: 68.3 kg (150 lb 9.2 oz) IBW/kg (Calculated) : 66.1 Estimated Creatinine Clearance: 55.1 mL/min (A) (by C-G formula based on SCr of 0.55 mg/dL (L)).  Potassium (mmol/L)  Date Value  03/26/2024 4.1  02/04/2015 4.2   Magnesium (mg/dL)  Date Value  82/95/6213 2.1   Calcium (mg/dL)  Date Value  08/65/7846 7.9 (L)   Calcium, Total (mg/dL)  Date Value  96/29/5284 8.5   Albumin (g/dL)  Date Value  13/24/4010 2.1 (L)  02/04/2015 3.5   Phosphorus (mg/dL)  Date Value  27/25/3664 2.3 (L)   Sodium (mmol/L)  Date Value  03/26/2024 130 (L)  02/04/2015 141   Assessment  [name] [name] is a 89 y.o. male presenting with a small bowel obstruction. PMH significant for severe PAD, systolic CHF secondary to ischemic cardiomyopathy, EF 35 to 40% 08/2023, CAD s/p CABG times 03/16/1998, and HTN. Pharmacy has been consulted to monitor and replace electrolytes.  Diet: soft MIVF: none  Goal of Therapy: Electrolytes WNL  Plan:  Phos 2.3: Kphos 500mg  PO x 2 Check electrolytes with AM labs  Thank you for involving pharmacy in this patient's care.   Bettey Costa, PharmD Clinical Pharmacist 03/26/2024 8:12 AM

## 2024-03-27 DIAGNOSIS — K56609 Unspecified intestinal obstruction, unspecified as to partial versus complete obstruction: Secondary | ICD-10-CM | POA: Diagnosis not present

## 2024-03-27 LAB — MAGNESIUM: Magnesium: 1.9 mg/dL (ref 1.7–2.4)

## 2024-03-27 LAB — BASIC METABOLIC PANEL WITH GFR
Anion gap: 6 (ref 5–15)
BUN: 22 mg/dL (ref 8–23)
CO2: 28 mmol/L (ref 22–32)
Calcium: 7.7 mg/dL — ABNORMAL LOW (ref 8.9–10.3)
Chloride: 97 mmol/L — ABNORMAL LOW (ref 98–111)
Creatinine, Ser: 0.66 mg/dL (ref 0.61–1.24)
GFR, Estimated: >60 mL/min (ref >60)
Glucose, Bld: 95 mg/dL (ref 70–99)
Potassium: 4.1 mmol/L (ref 3.5–5.1)
Sodium: 131 mmol/L — ABNORMAL LOW (ref 135–145)

## 2024-03-27 LAB — PHOSPHORUS: Phosphorus: 2.9 mg/dL (ref 2.5–4.6)

## 2024-03-27 NOTE — Plan of Care (Signed)

## 2024-03-27 NOTE — Progress Notes (Signed)
 Progress Note   Patient: [name] [name] GEX:[id number] DOB: Feb 23, 1929 DOA: 03/14/2024     12 DOS: the patient was seen and examined on 03/27/2024      Brief Narrative:    From admission h and p   [name] [name] is a 89 y.o. male with medical history significant for Severe PAD s/p multiple revascularization most recently 01/22/2024 for critical limb ischemia, and on chronic anticoagulation with Eliquis, systolic CHF secondary to ischemic cardiomyopathy, EF 35 to 40% 08/2023, CAD s/p CABG times 03/16/1998, HTN, who is being admitted with a small bowel obstruction, after presenting with nausea vomiting lower abdominal pain that started around noon on 3/17.  He had no diarrhea, no blood in the stool, no fever or chills.    Assessment & Plan:     # Small bowel obstruction status post surgery Patient is status post exploratory lap with lysis of adhesions 03/15/24.  Volvulus with hemorrhagic bowel encountered, no resection performed.  Bowel movement reported 3/23.  Continue current diet Patient currently cleared surgically for discharge Continue as needed pain medication Continue wound care   # Delirium S/p surgery , mildly confused, intermittent hallucinations Continue delirium precaution   # Acute blood loss anemia # Hemorrhagic shock # Retroperitoneal hematoma LEFT Status post 3 units of blood transfusion Monitor CBC closely   # SCC left neck --Wound care RN recs placed 3/19 Outpatient follow-up with oncology   # Ulcer left heel Chronic, followed by wound care as outpt, daughter says slowly improving Continue wound care   Hyponatremia Monitor sodium level closely   # CAD Hx cabg x3, stable - no chest pain Continue statin therapy Currently off ASA/Plavix due to bleeding   # Hypothyroid Continue Synthroid   # Chronic HFrEF Currently off Lasix metoprolol given hypotension   # Skin tear Right forearm, happened 3/21  Continue wound care Continue wound care   #  A-fib HR's up in setting of hemorrhagic shock Not on anticoagulation on account of retroperitoneal bleed    # PAD S/p multiple vascular procedures most recently January of this year --Hold aspirin and anticoagulation for now Continue statin therapy   # T2DM Well controlled. Last A1c 5.1    # R flank ecchymosis  Continue to monitor closely  # Debility Resides at [facility]. PT advises snf TOC working on placement Palliative care on board and we appreciate input   #Goals of care --Code status DNR/DNI --Palliative Care consulted Family is agreeable with discharge with hospice     DVT prophylaxis: none - due to retroperitoneal hematoma and hemorrhagic shock   Code Status: dnr/dni   Family Communication: Discussed with daughter and son-in-law at bedside   Consultants:  General Surgery Palliative Care   Procedures: Ex lap     Subjective: Patient seen and examined at bedside this morning Denies nausea vomiting abdominal pain chest pain cough Currently awaiting placement   Physical Exam    General exam: awake, drowsy, no acute distress, frail HEENT: moist mucus membranes, hearing grossly normal  Respiratory system: CTAB, no wheezes, rales or rhonchi, normal respiratory effort. Cardiovascular system: normal S1/S2  RRR, no lower extremity edema.   Gastrointestinal system: midline surgical incision appears well without signs of bleeding or surrounding erythema swelling or other signs of infection Central nervous system: more alert & conversational, A&Ox2+ no gross focal neurologic deficits, normal speech Extremities: LUE edema, offloading boots on feet, no BLE edema Skin: color is improved, minimal ecchymosis of LEFT flank, scattered ecchymosis  of extremities     Data Reviewed:         Latest Ref Rng & Units 03/27/2024    4:32 AM 03/26/2024    4:56 AM 03/25/2024    5:00 AM  BMP  Glucose 70 - 99 mg/dL 95  90  93   BUN 8 - 23 mg/dL 22  17  16    Creatinine 0.61  - 1.24 mg/dL 2.84  1.32  4.40   Sodium 135 - 145 mmol/L 131  130  132   Potassium 3.5 - 5.1 mmol/L 4.1  4.1  3.9   Chloride 98 - 111 mmol/L 97  95  98   CO2 22 - 32 mmol/L 28  27  29    Calcium 8.9 - 10.3 mg/dL 7.7  7.9  7.9        Vitals:   03/26/24 2130 03/27/24 0500 03/27/24 0510 03/27/24 0932  BP: (!) 123/56  (!) 113/54 (!) 109/52  Pulse: 91  85 84  Resp: 17  18   Temp: 98.5 F (36.9 C)  98.5 F (36.9 C) 98.5 F (36.9 C)  TempSrc: Oral   Oral  SpO2: 94%  98% 95%  Weight:  70.4 kg    Height:          Latest Ref Rng & Units 03/26/2024    4:56 AM 03/25/2024    5:00 AM 03/24/2024    5:34 AM  CBC  WBC 4.0 - 10.5 K/uL 12.8  11.8  12.9   Hemoglobin 13.0 - 17.0 g/dL 7.9  7.5  8.2   Hematocrit 39.0 - 52.0 % 24.4  22.2  25.0   Platelets 150 - 400 K/uL 221  195  185      Author: Loyce Dys, MD 03/27/2024 5:20 PM  For on call review [email or web address].

## 2024-03-27 NOTE — Plan of Care (Signed)
  Problem: Education: Goal: Knowledge of General Education information will improve Description: Including pain rating scale, medication(s)/side effects and non-pharmacologic comfort measures 03/27/2024 1900 by Jacki Cones, RN Outcome: Progressing 03/27/2024 1900 by Jacki Cones, RN Outcome: Progressing   Problem: Health Behavior/Discharge Planning: Goal: Ability to manage health-related needs will improve 03/27/2024 1900 by Jacki Cones, RN Outcome: Progressing 03/27/2024 1900 by Jacki Cones, RN Outcome: Progressing   Problem: Clinical Measurements: Goal: Ability to maintain clinical measurements within normal limits will improve 03/27/2024 1900 by Jacki Cones, RN Outcome: Progressing 03/27/2024 1900 by Jacki Cones, RN Outcome: Progressing Goal: Will remain free from infection 03/27/2024 1900 by Jacki Cones, RN Outcome: Progressing 03/27/2024 1900 by Jacki Cones, RN Outcome: Progressing Goal: Diagnostic test results will improve 03/27/2024 1900 by Jacki Cones, RN Outcome: Progressing 03/27/2024 1900 by Jacki Cones, RN Outcome: Progressing Goal: Respiratory complications will improve 03/27/2024 1900 by Jacki Cones, RN Outcome: Progressing 03/27/2024 1900 by Jacki Cones, RN Outcome: Progressing Goal: Cardiovascular complication will be avoided 03/27/2024 1900 by Jacki Cones, RN Outcome: Progressing 03/27/2024 1900 by Jacki Cones, RN Outcome: Progressing   Problem: Activity: Goal: Risk for activity intolerance will decrease 03/27/2024 1900 by Jacki Cones, RN Outcome: Progressing 03/27/2024 1900 by Jacki Cones, RN Outcome: Progressing   Problem: Nutrition: Goal: Adequate nutrition will be maintained 03/27/2024 1900 by Jacki Cones, RN Outcome: Progressing 03/27/2024 1900 by Jacki Cones, RN Outcome: Progressing   Problem: Coping: Goal: Level  of anxiety will decrease 03/27/2024 1900 by Jacki Cones, RN Outcome: Progressing 03/27/2024 1900 by Jacki Cones, RN Outcome: Progressing   Problem: Elimination: Goal: Will not experience complications related to bowel motility 03/27/2024 1900 by Jacki Cones, RN Outcome: Progressing 03/27/2024 1900 by Jacki Cones, RN Outcome: Progressing Goal: Will not experience complications related to urinary retention 03/27/2024 1900 by Jacki Cones, RN Outcome: Progressing 03/27/2024 1900 by Jacki Cones, RN Outcome: Progressing   Problem: Pain Managment: Goal: General experience of comfort will improve and/or be controlled 03/27/2024 1900 by Jacki Cones, RN Outcome: Progressing 03/27/2024 1900 by Jacki Cones, RN Outcome: Progressing   Problem: Safety: Goal: Ability to remain free from injury will improve 03/27/2024 1900 by Jacki Cones, RN Outcome: Progressing 03/27/2024 1900 by Jacki Cones, RN Outcome: Progressing   Problem: Skin Integrity: Goal: Risk for impaired skin integrity will decrease 03/27/2024 1900 by Jacki Cones, RN Outcome: Progressing 03/27/2024 1900 by Jacki Cones, RN Outcome: Progressing

## 2024-03-27 NOTE — Consult Note (Signed)
 PHARMACY CONSULT NOTE - ELECTROLYTES  Pharmacy Consult for Electrolyte Monitoring and Replacement   Recent Labs: Height: 5\' 7"  (170.2 cm) Weight: 70.4 kg (155 lb 3.3 oz) IBW/kg (Calculated) : 66.1 Estimated Creatinine Clearance: 55.1 mL/min (by C-G formula based on SCr of 0.66 mg/dL).  Potassium (mmol/L)  Date Value  03/27/2024 4.1  02/04/2015 4.2   Magnesium (mg/dL)  Date Value  40/98/1191 1.9   Calcium (mg/dL)  Date Value  47/82/9562 7.7 (L)   Calcium, Total (mg/dL)  Date Value  13/07/6577 8.5   Albumin (g/dL)  Date Value  46/96/2952 2.1 (L)  02/04/2015 3.5   Phosphorus (mg/dL)  Date Value  84/13/2440 2.9   Sodium (mmol/L)  Date Value  03/27/2024 131 (L)  02/04/2015 141   Assessment  [name] [name] is a 89 y.o. male presenting with a small bowel obstruction. PMH significant for severe PAD, systolic CHF secondary to ischemic cardiomyopathy, EF 35 to 40% 08/2023, CAD s/p CABG times 03/16/1998, and HTN. Pharmacy has been consulted to monitor and replace electrolytes.  Diet: soft MIVF: none  Goal of Therapy: Electrolytes WNL  Plan:  No replacement needed F/u with AM lab.   Thank you for involving pharmacy in this patient's care.   Ronnald Ramp, PharmD Clinical Pharmacist 03/27/2024 7:04 AM

## 2024-03-28 ENCOUNTER — Inpatient Hospital Stay: Payer: PPO

## 2024-03-28 ENCOUNTER — Telehealth: Payer: Self-pay | Admitting: Hospice and Palliative Medicine

## 2024-03-28 ENCOUNTER — Inpatient Hospital Stay: Payer: PPO | Admitting: Internal Medicine

## 2024-03-28 ENCOUNTER — Other Ambulatory Visit: Payer: Self-pay | Admitting: Internal Medicine

## 2024-03-28 DIAGNOSIS — K56609 Unspecified intestinal obstruction, unspecified as to partial versus complete obstruction: Secondary | ICD-10-CM | POA: Diagnosis not present

## 2024-03-28 LAB — RENAL FUNCTION PANEL
Albumin: 2.3 g/dL — ABNORMAL LOW (ref 3.5–5.0)
Anion gap: 8 (ref 5–15)
BUN: 20 mg/dL (ref 8–23)
CO2: 28 mmol/L (ref 22–32)
Calcium: 7.9 mg/dL — ABNORMAL LOW (ref 8.9–10.3)
Chloride: 96 mmol/L — ABNORMAL LOW (ref 98–111)
Creatinine, Ser: 0.63 mg/dL (ref 0.61–1.24)
GFR, Estimated: >60 mL/min (ref >60)
Glucose, Bld: 98 mg/dL (ref 70–99)
Phosphorus: 2.7 mg/dL (ref 2.5–4.6)
Potassium: 4.1 mmol/L (ref 3.5–5.1)
Sodium: 132 mmol/L — ABNORMAL LOW (ref 135–145)

## 2024-03-28 LAB — MAGNESIUM: Magnesium: 2 mg/dL (ref 1.7–2.4)

## 2024-03-28 NOTE — Progress Notes (Signed)
 Progress Note   Patient: [name] [name] VWU:[id number] DOB: 1929-03-11 DOA: 03/14/2024     13 DOS: the patient was seen and examined on 03/28/2024     Brief Narrative:    From admission h and p   [name] [name] is a 89 y.o. male with medical history significant for Severe PAD s/p multiple revascularization most recently 01/22/2024 for critical limb ischemia, and on chronic anticoagulation with Eliquis, systolic CHF secondary to ischemic cardiomyopathy, EF 35 to 40% 08/2023, CAD s/p CABG times 03/16/1998, HTN, who is being admitted with a small bowel obstruction, after presenting with nausea vomiting lower abdominal pain that started around noon on 3/17.  He had no diarrhea, no blood in the stool, no fever or chills.    Assessment & Plan:     # Small bowel obstruction status post surgery Patient is status post exploratory lap with lysis of adhesions 03/15/24.  Volvulus with hemorrhagic bowel encountered, no resection performed.  Bowel movement reported 3/23.  Continue current diet Patient currently cleared surgically for discharge Continue as needed pain medication Continue wound care   # Delirium S/p surgery , mildly confused, intermittent hallucinations Continue delirium precaution   # Acute blood loss anemia # Hemorrhagic shock # Retroperitoneal hematoma LEFT Status post 3 units of blood transfusion Monitor CBC closely   # SCC left neck --Wound care RN recs placed 3/19 Outpatient follow-up with oncology   # Ulcer left heel Chronic, followed by wound care as outpt, daughter says slowly improving Continue wound care   Hyponatremia Monitor sodium level closely   # CAD Hx cabg x3, stable - no chest pain Continue statin therapy Currently off ASA/Plavix due to bleeding   # Hypothyroid Continue Synthroid   # Chronic HFrEF Currently off Lasix metoprolol given hypotension   # Skin tear Right forearm, happened 3/21  Continue wound care Continue wound care   #  A-fib HR's up in setting of hemorrhagic shock Not on anticoagulation on account of retroperitoneal bleed     # PAD S/p multiple vascular procedures most recently January of this year --Hold aspirin and anticoagulation for now Continue statin therapy   # T2DM Well controlled. Last A1c 5.1    # R flank ecchymosis  Continue to monitor closely   # Debility Resides at [facility]. PT advises snf TOC working on placement Palliative care on board and we appreciate input   #Goals of care --Code status DNR/DNI --Palliative Care consulted Family is agreeable with discharge with hospice     DVT prophylaxis: none - due to retroperitoneal hematoma and hemorrhagic shock   Code Status: dnr/dni   Family Communication: Discussed with daughter and son-in-law at bedside   Consultants:  General Surgery Palliative Care   Procedures: Ex lap     Subjective: Patient seen and examined at bedside this morning Denies nausea vomiting abdominal pain chest pain   Physical Exam    General exam: awake, drowsy, no acute distress, frail HEENT: moist mucus membranes, hearing grossly normal  Respiratory system: CTAB, no wheezes, rales or rhonchi, normal respiratory effort. Cardiovascular system: normal S1/S2  RRR, no lower extremity edema.   Gastrointestinal system: midline surgical incision appears well without signs of bleeding or surrounding erythema swelling or other signs of infection Central nervous system: more alert & conversational, A&Ox2+ no gross focal neurologic deficits, normal speech Extremities: LUE edema, offloading boots on feet, no BLE edema Skin: color is improved, minimal ecchymosis of LEFT flank, scattered ecchymosis of extremities  Data Reviewed:   Vitals:   03/27/24 0932 03/27/24 1839 03/28/24 0808 03/28/24 1440  BP: (!) 109/52 (!) 115/51 124/70 122/62  Pulse: 84 93 86 81  Resp:  20 19 19   Temp: 98.5 F (36.9 C) 98.3 F (36.8 C) 98.2 F (36.8 C) 98 F (36.7  C)  TempSrc: Oral Oral    SpO2: 95% 92% 94% 97%  Weight:      Height:          Latest Ref Rng & Units 03/26/2024    4:56 AM 03/25/2024    5:00 AM 03/24/2024    5:34 AM  CBC  WBC 4.0 - 10.5 K/uL 12.8  11.8  12.9   Hemoglobin 13.0 - 17.0 g/dL 7.9  7.5  8.2   Hematocrit 39.0 - 52.0 % 24.4  22.2  25.0   Platelets 150 - 400 K/uL 221  195  185        Latest Ref Rng & Units 03/28/2024    5:19 AM 03/27/2024    4:32 AM 03/26/2024    4:56 AM  BMP  Glucose 70 - 99 mg/dL 98  95  90   BUN 8 - 23 mg/dL 20  22  17    Creatinine 0.61 - 1.24 mg/dL 1.61  0.96  0.45   Sodium 135 - 145 mmol/L 132  131  130   Potassium 3.5 - 5.1 mmol/L 4.1  4.1  4.1   Chloride 98 - 111 mmol/L 96  97  95   CO2 22 - 32 mmol/L 28  28  27    Calcium 8.9 - 10.3 mg/dL 7.9  7.7  7.9      Author: Loyce Dys, MD 03/28/2024 5:40 PM  For on call review [email or web address].

## 2024-03-28 NOTE — Plan of Care (Signed)

## 2024-03-28 NOTE — Telephone Encounter (Signed)
 Please call daughter [phone] Bjorn Loser . She is wanting to know if you will be seeing her father in the hospital? She states she has to be at his appointments and would like to know if you are going over? She herself has an appointment her at 1:45 today and says if you have a chance could she speak to you then? If not she will take a phone call.

## 2024-03-28 NOTE — Progress Notes (Signed)
 Patient seen and examined this morning.  He reports doing okay.  He says that he has been passing gas.  He denies any abdominal pain or feelings of nausea or vomiting.  On exam his distention has greatly improved over the last week.  His staples are intact with little bit of bruising around them.  He has some minor tenderness around the incision.  Unfortunately he has had poor p.o. intake but per nursing staff he had a smear of bowel movement overnight.  Continue diet as tolerated.  We will ensure that he gets his MiraLAX today to help him have a bowel movement has been 2 days since he has had a good recorded bowel movement.  If he is able to leave today then staples can be removed if not then staples can be removed tomorrow.

## 2024-03-28 NOTE — Consult Note (Signed)
 PHARMACY CONSULT NOTE - ELECTROLYTES  Pharmacy Consult for Electrolyte Monitoring and Replacement   Recent Labs: Height: 5\' 7"  (170.2 cm) Weight: 70.4 kg (155 lb 3.3 oz) IBW/kg (Calculated) : 66.1 Estimated Creatinine Clearance: 55.1 mL/min (by C-G formula based on SCr of 0.63 mg/dL).  Potassium (mmol/L)  Date Value  03/28/2024 4.1  02/04/2015 4.2   Magnesium (mg/dL)  Date Value  16/09/9603 2.0   Calcium (mg/dL)  Date Value  54/08/8118 7.9 (L)   Calcium, Total (mg/dL)  Date Value  14/78/2956 8.5   Albumin (g/dL)  Date Value  21/30/8657 2.3 (L)  02/04/2015 3.5   Phosphorus (mg/dL)  Date Value  84/69/6295 2.7   Sodium (mmol/L)  Date Value  03/28/2024 132 (L)  02/04/2015 141    Corrected Ca: 9.3       (Ca 7.9   albumin 2.3)  Assessment  [name] [name] is a 89 y.o. male presenting with a small bowel obstruction. PMH significant for severe PAD, systolic CHF secondary to ischemic cardiomyopathy, EF 35 to 40% 08/2023, CAD s/p CABG times 03/16/1998, and HTN. Pharmacy has been consulted to monitor and replace electrolytes. Patient is status post exploratory lap with lysis of adhesions 03/15/24.  Volvulus with hemorrhagic bowel encountered, no resection performed. Retroperitoneal hematoma- Hx CABGx3-Currently off ASA/Plavix due to bleeding   Diet: NPO MIVF: none  Goal of Therapy: Electrolytes WNL  Plan:  No electrolyte replacement needed at this time F/u with AM labs.   Thank you for involving pharmacy in this patient's care.   Angelique Blonder, PharmD Clinical Pharmacist 03/28/2024 9:36 AM

## 2024-03-28 NOTE — Consult Note (Signed)
 [facility] Liaison Note  03/28/2024  [name] December 01, 1929 [id number]  Location: RN Hospital Liaison screened the patient remotely at [facility].  Insurance: [employer]   [name] [name] is a 89 y.o. male who is a Primary Care Patient of [name], MD  [facility].The patient was screened for readmission hospitalization with noted high risk score for unplanned readmission risk with 3 IP/1 ED in 6 months.  The patient was assessed for potential Care Management service needs for post hospital transition for care coordination. Review of patient's electronic medical record reveals patient was  admitted for Small bowel obstruction. Pt recommended for SNF level of care. If pt discharges to a SNF the facility will continued to assess his needs.  Plan: [facility] Liaison will continue to follow progress and disposition to asess for post hospital community care coordination/management needs.  Referral request for community care coordination: pending disposition.   [facility] Care Management/Population Health does not replace or interfere with any arrangements made by the Inpatient Transition of Care team.   For questions contact:   Elliot Cousin, RN, BSN Hospital Liaison [place]   [facility], Population Health Office Hours [email or web address]  8:00 am-6:00 pm Direct Dial: [phone] mobile [email or web address]

## 2024-03-28 NOTE — Evaluation (Signed)
 Clinical/Bedside Swallow Evaluation Patient Details  Name: [name] [name] MRN: [id number] Date of Birth: 1929/09/07  Today's Date: 03/28/2024 Time: SLP Start Time (ACUTE ONLY): 1225 SLP Stop Time (ACUTE ONLY): 1325 SLP Time Calculation (min) (ACUTE ONLY): 60 min  Past Medical History:  Past Medical History:  Diagnosis Date   A-fib (HCC)    a.) CHA2DS2VASc = 5 (age x 2, CHF, HTN, previous MI/vascular disease history);  b.) s/p DCCV 03/05/1993; c.) rate/rhythm maintained on oral metoprolol succinate; chronically anticoagulated with dose reduced apixaban   Aortic atherosclerosis (HCC)    B12 deficiency    CHF (congestive heart failure) (HCC)    a.) TTE 11/05/12: EF 25-35, dist sep AK, mild MR; b.) TTE 05/10/14: EF 30, mild LVH, mod BAE, mild AR/PR, mod MR/TR; c.) TTE 03/12/20: EF 20, sev glob HK, mod BAE, mild LAE, mod RAE, triv PR, mild AR, sev MR/TR; d.) TTE 08/21/21: EF 30, sev glob HK, apical dyskinesis, mild LVH, mod BAE, triv AR/PR, mod MR, sev TR; e.) TTE 09/25/23: EF 35-40,  diff AK/HK, G2DD, sev BAE, RVE, RVSP 66.3, mild MR, AoV calc   Cor pulmonale (HCC)    Coronary artery disease 02/21/1993   a.) LHC 02/21/1993 : 95% pLAD, 75% OM1, 50% OM3, 25-50% pRCA, 50% large anterolateral lesion --> transfer to [facility] for CABG; b.) s/p 2v CABG 02/26/1993 (LIMA-LAD, SVG-OM1)   Family history of adverse reaction to anesthesia    a.) PONV in 1st degree relative (daughter)   GERD (gastroesophageal reflux disease)    History of [place] spotted fever    HLD (hyperlipidemia)    HTN (hypertension), benign 04/25/2014   Hypothyroidism    Iron deficiency anemia    On apixaban therapy    Posterolateral myocardial infarction (HCC) 02/21/1993   LHC 02/21/1993 : 95% pLAD, 75% OM1, 50% OM3, 25-50% pRCA, 50% large anterolateral lesion --> transfer to [facility] for CABG; b.) s/p 2v CABG 02/26/1993 (LIMA-LAD, SVG-OM1)   Pulmonary HTN (HCC)    a.) TTE 05/10/2014: RVSP 48.6; b.) TTE 03/12/2020: RVSP 73.3; c.)  TTE  09/25/2023: RVSP 66.3   PVD (peripheral vascular disease) (HCC)    S/P CABG x 2 02/26/1993   a.) LIMA-LAD, SVG-OM1 --> complicated by atrial flutter on POD4 --> Tx'd with digoxin + procainamide with no resolution. POD5 diltiazem gtt added with no improvement. Ultimately required DCCV on 03/05/1993.   Squamous cell carcinoma of skin 11/18/2021   right ear and mandible, EDC   Squamous cell carcinoma of skin 03/18/2022   right mandible and ear/refer for Mohs/ Dr. Adriana Simas has referred pt to Dr. Nedra Hai in otolaryngology excised by Dr. Nedra Hai 05/03/22, Radiation with Dr. Rushie Chestnut   Type 2 diabetes mellitus with peripheral angiopathy (HCC) 11/10/2018   Past Surgical History:  Past Surgical History:  Procedure Laterality Date   ANGIOPLASTY Left 01/22/2024   Procedure: BALLOON ANGIOPLASTY OF TIBIAL PERONEAL TRUNK AND POSTERIOR TBIAL ARTERY AND DRUG COATED BALLOON ANGIOPLASTY OF POPLITEAL ARTERY;  Surgeon: Nada Libman, MD;  Location: [facility] OR;  Service: Vascular;  Laterality: Left;   cancer removal   2023   below the right base of ear at [facility]   COLONOSCOPY     CORONARY ARTERY BYPASS GRAFT N/A 02/26/1993   Procedure: CORONARY ARTERY BYPASS GRAFT; Location: [facility]; Surgeon: Marshell Garfinkel, MD   ENDARTERECTOMY FEMORAL Left 01/22/2024   Procedure: LEFT COMMON FEMORAL ENDARTERECTOMY WITH VEIN PATCH;  Surgeon: Nada Libman, MD;  Location: [facility] OR;  Service: Vascular;  Laterality: Left;  FEMORAL-FEMORAL BYPASS GRAFT Left 01/22/2024   Procedure: BYPASS GRAFT LEFT COMMON FEMORAL TO PROFUNDA  ARTERY USING DACRON GRAFT;  Surgeon: Nada Libman, MD;  Location: [facility] OR;  Service: Vascular;  Laterality: Left;   HERNIA REPAIR     LAPAROTOMY N/A 03/15/2024   Procedure: LAPAROTOMY, EXPLORATORY, REDUCTION OF VOLVULUS;  Surgeon: Kandis Cocking, MD;  Location: [facility] ORS;  Service: General;  Laterality: N/A;   LOWER EXTREMITY ANGIOGRAPHY Left 08/30/2021   Procedure: LOWER EXTREMITY ANGIOGRAPHY;  Surgeon: Renford Dills, MD;  Location: [facility] INVASIVE CV LAB;  Service: Cardiovascular;  Laterality: Left;   LOWER EXTREMITY ANGIOGRAPHY Left 11/10/2023   Procedure: Lower Extremity Angiography;  Surgeon: Renford Dills, MD;  Location: [facility] INVASIVE CV LAB;  Service: Cardiovascular;  Laterality: Left;   HPI:  Pt is a 89 y.o. male with medical history significant for Severe PAD s/p multiple revascularization most recently 01/22/2024 for critical limb ischemia, and on chronic anticoagulation with Eliquis, systolic CHF secondary to ischemic cardiomyopathy, EF 35 to 40% 08/2023, CAD s/p CABG times 03/16/1998, HTN, GERD, HOH, who admitted with a small bowel obstruction, after presenting with Nausea and Vomiting w/ lower abdominal pain that started around noon on 3/17. Pt has now been hospitalized for 13 days today.   CT of the Abdomen/pelvis on 03/20/24 revealed several abdomen/pelvic issuse and moderate bilateral pleural effusions, new on the left and  increased on the right w/ compressive collapse or consolidation in both lower lobes.  No Head imaging.   OF NOTE: No previous chest Imaging prior to this admit for 1+ years.    Assessment / Plan / Recommendation  Clinical Impression   Pt seen for BSE today post NSG concern for change in status last PM; concern for swallowing po's. Pt awake, lying in bed. Noted generalized weakness w/ slight open-mouth posture at rest and during speech, which slightly impacted articulation of some speech sounds. Pt alert to self and answered basic questions re: immediate self/environment. MOD+ asisst to position upright in bed. HOH. Noted congested cough at baseline. Reduced/poor po intake Baseline per chart.  On [place] O2 2L; afebrile.   Pt appears to present w/ functional oropharyngeal phase swallowing w/ No immediate, oropharyngeal phase dysphagia noted, No overt neuromuscular deficits noted. Pt consumed po trials w/ No immediate, overt clinical s/s of aspiration during po trials. Mild throat  clear/cough occurred 1x b/t trials of thin liquids/po's; it was not repeated.  Pt appears at reduced risk for aspiration when following general aspiration precautions w/ a slightly modified diet for ease of intake -- for conservation of energy d/t the cooked, moist foods Cut and easier to chew. However, pt does have challenging factors that could impact oropharyngeal swallowing to include Chronic Comorbidities and lengthy hospitalization, significant weakness and a more bedbound status currently, deconditioning, and advanced Age(89). His weakness/deconditioning can impact self-feeding abilities. These factors can increase risk for dysphagia as well as decreased oral intake overall.   During po trials, pt consumed all consistencies w/ no immediate, overt coughing, no decline in vocal quality, nor change in respiratory presentation during/post trials. O2 sats remained 97-98% during when checked. Oral phase appeared grossly [facility] w/ timely bolus management, mastication, and control of bolus propulsion for A-P transfer for swallowing. Oral clearing achieved w/ all trial consistencies -- Moistened, soft foods given. Support feeding given.  OM Exam appeared [facility] w/ no unilateral weakness noted. Speech intelligible.   Recommend a more Mech Soft consistency diet w/ well-Cut meats, moistened foods;  Thin liquids -- carefully monitor, and pt should Hold Cup when drinking. Recommend general aspiration precautions, reduce distractions/talking during meals/po intake. Tray setup and feeding support at meals. Positioning support upright w/ head forward. Rest Breaks given during meals for conservation of energy. REFLUX precautions. Pills WHOLE vs CRUSHED in Puree for safer, easier swallowing -- it was encourged now and for D/C to the pt.   Education given on Pills in Puree; food consistencies and easy to eat options; general aspiration precautions to pt. NSG updated on above and agreed. ST services will monitor. F/u at next  venue of care for Education can be had if any needs indicated. Recommend f/u w/ Palliative Care for ongoing Education re: the aging swallow and its impact overall. NSG updated, agreed. MD updated. Recommend Dietician f/u for support. Precautions posted in chart, room.  SLP Visit Diagnosis: Dysphagia, unspecified (R13.10) (in setting of Advanced Age; Comorbidities)    Aspiration Risk  Mild aspiration risk;Risk for inadequate nutrition/hydration (Baseline)    Diet Recommendation   Thin;Dysphagia 3 (mechanical soft) (moistened foods; gravies) = a more Mech Soft consistency diet w/ well-Cut meats, moistened foods; Thin liquids -- carefully monitor, and pt should Hold Cup when drinking. Recommend general aspiration precautions, reduce distractions/talking during meals/po intake. Tray setup and feeding support at meals. Positioning support upright w/ head forward. Rest Breaks given during meals for conservation of energy. REFLUX precautions.  Medication Administration: Whole meds with puree (vs need to CRUSH in Puree)    Other  Recommendations Recommended Consults:  (Dietician; Palliative Care for GOC/QOL) Oral Care Recommendations: Oral care BID;Oral care before and after PO;Staff/trained caregiver to provide oral care (partial care)    Recommendations for follow up therapy are one component of a multi-disciplinary discharge planning process, led by the attending physician.  Recommendations may be updated based on patient status, additional functional criteria and insurance authorization.  Follow up Recommendations Follow physician's recommendations for discharge plan and follow up therapies (next venue of care if needs indicate)      Assistance Recommended at Discharge  Intermittent  Functional Status Assessment Patient has not had a recent decline in their functional status  Frequency and Duration min 1 x/week  1 week       Prognosis Prognosis for improved oropharyngeal function: Fair  (-Good) Barriers to Reach Goals: Time post onset;Severity of deficits Barriers/Prognosis Comment: in setting of Advanced Age; Comorbidities; Deconditioning      Swallow Study   General Date of Onset: 03/15/24 HPI: Pt is a 89 y.o. male with medical history significant for Severe PAD s/p multiple revascularization most recently 01/22/2024 for critical limb ischemia, and on chronic anticoagulation with Eliquis, systolic CHF secondary to ischemic cardiomyopathy, EF 35 to 40% 08/2023, CAD s/p CABG times 03/16/1998, HTN, GERD, HOH, who admitted with a small bowel obstruction, after presenting with Nausea and Vomiting w/ lower abdominal pain that started around noon on 3/17. Pt has now been hospitalized for 13 days today.   CT of the Abdomen/pelvis on 03/20/24 revealed several abdomen/pelvic issuse and moderate bilateral pleural effusions, new on the left and  increased on the right w/ compressive collapse or consolidation in both lower lobes.  No Head imaging.   OF NOTE: No previous chest Imaging prior to this admit for 1+ years. Type of Study: Bedside Swallow Evaluation Previous Swallow Assessment: none - pt denied any swallowing issues. Diet Prior to this Study: NPO (post being on regular diet w/ thins until NSG noted change in mental status/swallowing last night)  Temperature Spikes Noted: No (wbc 12.8) Respiratory Status: Nasal cannula (2L) History of Recent Intubation: No Behavior/Cognition: Alert;Cooperative;Pleasant mood;Distractible;Requires cueing (HOH) Oral Cavity Assessment: Within Functional Limits Oral Care Completed by SLP: Yes Oral Cavity - Dentition: Adequate natural dentition (partial) Vision: Functional for self-feeding Self-Feeding Abilities: Able to feed self;Needs assist;Needs set up Patient Positioning: Upright in bed (needed support) Baseline Vocal Quality: Normal Volitional Cough: Strong;Congested Volitional Swallow: Able to elicit    Oral/Motor/Sensory Function Overall Oral  Motor/Sensory Function: Within functional limits   Ice Chips Ice chips: Within functional limits Presentation: Spoon (fed; 2 trials)   Thin Liquid Thin Liquid: Within functional limits Presentation: Cup;Self Fed (~6-7 ozs total) Other Comments: water, milk    Nectar Thick Nectar Thick Liquid: Not tested   Honey Thick Honey Thick Liquid: Not tested   Puree Puree: Within functional limits Presentation: Self Fed;Spoon (supported; 8 trials)   Solid     Solid: Within functional limits (moistened foods) Presentation: Spoon (fed; 6 trials)        Jerilynn Som, MS, CCC-SLP Speech Language Pathologist Rehab Services; [facility] [phone] (ascom) Teka Chanda 03/28/2024,4:16 PM

## 2024-03-28 NOTE — Progress Notes (Signed)
 Physical Therapy Treatment Patient Details Name: [name] [name] MRN: [id number] DOB: 07-Jan-1929 Today's Date: 03/28/2024   History of Present Illness Pt admitted for SBO s/p exploratory laparotomy. Left RP bleed with transfer to ICU. PMHx of critical limb ischemia s/p femoral endarterectomy, CHF, CAD s/p CABG, on chronic anticoagulation, immunotherapy for recurrent squamous cell carcinoma.    PT Comments  Pt is awake and agreeable to participation this date. Pt reports being uncomfortable in bed, assisted with repositioning at end of session in addition to sitting at EOB for short period or time. Attempted supine there-ex, however limited in endurance. Will continue to progress    If plan is discharge home, recommend the following: Two people to help with walking and/or transfers;A lot of help with bathing/dressing/bathroom;Help with stairs or ramp for entrance;Supervision due to cognitive status   Can travel by private vehicle     No  Equipment Recommendations  None recommended by PT    Recommendations for Other Services       Precautions / Restrictions Precautions Precautions: Fall Recall of Precautions/Restrictions: Impaired Restrictions Weight Bearing Restrictions Per Provider Order: No     Mobility  Bed Mobility Overal bed mobility: Needs Assistance Bed Mobility: Supine to Sit, Sit to Supine     Supine to sit: Max assist Sit to supine: Max assist   General bed mobility comments: needs assist for initiaion. ONce seated, post lean and tilt noted. Has difficulty with endurance and fatigues quickly after sitting for 2-3 minutes.    Transfers                   General transfer comment: unsafe due to poor activity tolerance    Ambulation/Gait                   Stairs             Wheelchair Mobility     Tilt Bed    Modified Rankin (Stroke Patients Only)       Balance Overall balance assessment: Needs assistance Sitting-balance  support: Bilateral upper extremity supported Sitting balance-Leahy Scale: Poor                                      Communication Communication Communication: Impaired Factors Affecting Communication: Difficulty expressing self;Hearing impaired  Cognition Arousal: Alert Behavior During Therapy: WFL for tasks assessed/performed   PT - Cognitive impairments: History of cognitive impairments                       PT - Cognition Comments: pleasant, however thinks he is at home vs hospital. Asking for a pair of jeans Following commands: Impaired Following commands impaired: Follows one step commands with increased time    Cueing Cueing Techniques: Verbal cues, Tactile cues  Exercises Other Exercises Other Exercises: attempted supine SLR and heel slides. Able to perform around 5 reps with mod assist. Assisted with repositioning in bed including elevating B LEs and donning space boots    General Comments        Pertinent Vitals/Pain Pain Assessment Pain Assessment: Faces Faces Pain Scale: Hurts little more Pain Location: L heel Pain Descriptors / Indicators: Discomfort, Grimacing Pain Intervention(s): Limited activity within patient's tolerance, Repositioned    Home Living  Prior Function            PT Goals (current goals can now be found in the care plan section) Acute Rehab PT Goals Patient Stated Goal: to walk again PT Goal Formulation: With patient Time For Goal Achievement: 03/30/24 Potential to Achieve Goals: Fair Progress towards PT goals: Progressing toward goals    Frequency    Min 2X/week      PT Plan      Co-evaluation              AM-PAC PT "6 Clicks" Mobility   Outcome Measure  Help needed turning from your back to your side while in a flat bed without using bedrails?: A Lot Help needed moving from lying on your back to sitting on the side of a flat bed without using bedrails?:  Total Help needed moving to and from a bed to a chair (including a wheelchair)?: Total Help needed standing up from a chair using your arms (e.g., wheelchair or bedside chair)?: Total Help needed to walk in hospital room?: Total Help needed climbing 3-5 steps with a railing? : Total 6 Click Score: 7    End of Session Equipment Utilized During Treatment: Oxygen Activity Tolerance: Patient limited by fatigue;Patient tolerated treatment well Patient left: in bed;with call bell/phone within reach;with bed alarm set Nurse Communication: Mobility status PT Visit Diagnosis: Unsteadiness on feet (R26.81);Muscle weakness (generalized) (M62.81);History of falling (Z91.81);Difficulty in walking, not elsewhere classified (R26.2)     Time: 4782-9562 PT Time Calculation (min) (ACUTE ONLY): 12 min  Charges:    $Therapeutic Activity: 8-22 mins PT General Charges $$ ACUTE PT VISIT: 1 Visit                     Elizabeth Palau, PT, DPT, GCS [phone]    [name] 03/28/2024, 11:15 AM

## 2024-03-29 ENCOUNTER — Inpatient Hospital Stay: Payer: PPO | Admitting: Hospice and Palliative Medicine

## 2024-03-29 DIAGNOSIS — R112 Nausea with vomiting, unspecified: Secondary | ICD-10-CM | POA: Diagnosis not present

## 2024-03-29 DIAGNOSIS — R1030 Lower abdominal pain, unspecified: Secondary | ICD-10-CM | POA: Diagnosis not present

## 2024-03-29 DIAGNOSIS — I5022 Chronic systolic (congestive) heart failure: Secondary | ICD-10-CM | POA: Diagnosis not present

## 2024-03-29 DIAGNOSIS — K56609 Unspecified intestinal obstruction, unspecified as to partial versus complete obstruction: Secondary | ICD-10-CM | POA: Diagnosis not present

## 2024-03-29 LAB — RENAL FUNCTION PANEL
Albumin: 2.2 g/dL — ABNORMAL LOW (ref 3.5–5.0)
Anion gap: 8 (ref 5–15)
BUN: 19 mg/dL (ref 8–23)
CO2: 25 mmol/L (ref 22–32)
Calcium: 7.7 mg/dL — ABNORMAL LOW (ref 8.9–10.3)
Chloride: 98 mmol/L (ref 98–111)
Creatinine, Ser: 0.6 mg/dL — ABNORMAL LOW (ref 0.61–1.24)
GFR, Estimated: >60 mL/min (ref >60)
Glucose, Bld: 89 mg/dL (ref 70–99)
Phosphorus: 2.7 mg/dL (ref 2.5–4.6)
Potassium: 4 mmol/L (ref 3.5–5.1)
Sodium: 131 mmol/L — ABNORMAL LOW (ref 135–145)

## 2024-03-29 MED ORDER — ACETAMINOPHEN 325 MG PO TABS
650.0000 mg | ORAL_TABLET | Freq: Four times a day (QID) | ORAL | Status: DC | PRN
  Administered 2024-03-29 – 2024-04-04 (×3): 650 mg via ORAL
  Filled 2024-03-29 (×3): qty 2

## 2024-03-29 NOTE — Plan of Care (Signed)

## 2024-03-29 NOTE — Progress Notes (Signed)
 Progress Note   Patient: [name] [name] UEA:[id number] DOB: Oct 28, 1929 DOA: 03/14/2024     14 DOS: the patient was seen and examined on 03/29/2024    Brief Narrative:    From admission h and p   [name] [name] is a 89 y.o. male with medical history significant for Severe PAD s/p multiple revascularization most recently 01/22/2024 for critical limb ischemia, and on chronic anticoagulation with Eliquis, systolic CHF secondary to ischemic cardiomyopathy, EF 35 to 40% 08/2023, CAD s/p CABG times 03/16/1998, HTN, who is being admitted with a small bowel obstruction, after presenting with nausea vomiting lower abdominal pain that started around noon on 3/17.  He had no diarrhea, no blood in the stool, no fever or chills.    Assessment & Plan:     # Small bowel obstruction status post surgery Patient is status post exploratory lap with lysis of adhesions 03/15/24.  Volvulus with hemorrhagic bowel encountered, no resection performed.  Bowel movement reported 3/23.  Continue current diet Patient currently cleared surgically for discharge Continue as needed pain medication Continue wound care   # Delirium S/p surgery , mildly confused, intermittent hallucinations Continue delirium precaution   # Acute blood loss anemia # Hemorrhagic shock # Retroperitoneal hematoma LEFT Status post 3 units of blood transfusion Monitor CBC closely   # SCC left neck --Wound care RN recs placed 3/19 Outpatient follow-up with oncology   # Ulcer left heel Chronic, followed by wound care as outpt, daughter says slowly improving Continue wound care   Hyponatremia Monitor sodium level closely   # CAD Hx cabg x3, stable - no chest pain Continue statin therapy Currently off ASA/Plavix due to bleeding   # Hypothyroid Continue Synthroid   # Chronic HFrEF Currently off Lasix metoprolol given hypotension   # Skin tear Right forearm, happened 3/21  Continue wound care Continue wound care   #  A-fib HR's up in setting of hemorrhagic shock Not on anticoagulation on account of retroperitoneal bleed     # PAD S/p multiple vascular procedures most recently January of this year --Hold aspirin and anticoagulation for now Continue statin therapy   # T2DM Well controlled. Last A1c 5.1    # R flank ecchymosis  Continue to monitor closely   # Debility Resides at [facility]. PT advises snf TOC working on placement Palliative care on board and we appreciate input   #Goals of care --Code status DNR/DNI --Palliative Care consulted Family is agreeable with discharge with hospice     DVT prophylaxis: none - due to retroperitoneal hematoma and hemorrhagic shock   Code Status: dnr/dni   Family Communication: Discussed with daughter and son-in-law at bedside   Consultants:  General Surgery Palliative Care   Procedures: Ex lap     Subjective: Patient seen and examined at bedside this morning He denies worsening respiratory function chest pain or cough I spoke with patient's daughter and at this time did not looking at options of acceptance to a facility with hospice and availability According to patient's daughter patient does not have anyone at home to take care of him if he were to go home with hospice [facility] manager is working on this   Physical Exam    General exam: awake, drowsy, no acute distress, frail HEENT: moist mucus membranes, hearing grossly normal  Respiratory system: CTAB, no wheezes, rales or rhonchi, normal respiratory effort. Cardiovascular system: normal S1/S2  RRR, no lower extremity edema.   Gastrointestinal system: midline surgical incision  appears well without signs of bleeding or surrounding erythema swelling or other signs of infection Central nervous system: more alert & conversational, A&Ox2+ no gross focal neurologic deficits, normal speech Extremities: LUE edema, offloading boots on feet, no BLE edema Skin: color is improved, minimal  ecchymosis of LEFT flank, scattered ecchymosis of extremities     Data Reviewed:       Latest Ref Rng & Units 03/26/2024    4:56 AM 03/25/2024    5:00 AM 03/24/2024    5:34 AM  CBC  WBC 4.0 - 10.5 K/uL 12.8  11.8  12.9   Hemoglobin 13.0 - 17.0 g/dL 7.9  7.5  8.2   Hematocrit 39.0 - 52.0 % 24.4  22.2  25.0   Platelets 150 - 400 K/uL 221  195  185        Latest Ref Rng & Units 03/29/2024    4:29 AM 03/28/2024    5:19 AM 03/27/2024    4:32 AM  BMP  Glucose 70 - 99 mg/dL 89  98  95   BUN 8 - 23 mg/dL 19  20  22    Creatinine 0.61 - 1.24 mg/dL 1.61  0.96  0.45   Sodium 135 - 145 mmol/L 131  132  131   Potassium 3.5 - 5.1 mmol/L 4.0  4.1  4.1   Chloride 98 - 111 mmol/L 98  96  97   CO2 22 - 32 mmol/L 25  28  28    Calcium 8.9 - 10.3 mg/dL 7.7  7.9  7.7     Vitals:   03/29/24 0309 03/29/24 0500 03/29/24 0700 03/29/24 1538  BP: (!) 106/59  (!) 102/50 112/75  Pulse: 87  84 84  Resp: 20  20 (!) 22  Temp: 97.9 F (36.6 C)  98.4 F (36.9 C) 98.2 F (36.8 C)  TempSrc:   Oral   SpO2: 95%  96% 99%  Weight:  72.4 kg    Height:         Time spent: 40 minutes  Author: Loyce Dys, MD 03/29/2024 4:49 PM  For on call review [email or web address].

## 2024-03-29 NOTE — Progress Notes (Signed)
 Occupational Therapy Treatment Patient Details Name: [name] [name] MRN: [id number] DOB: Apr 30, 1929 Today's Date: 03/29/2024   History of present illness Pt admitted for SBO s/p exploratory laparotomy. Left RP bleed with transfer to ICU. PMHx of critical limb ischemia s/p femoral endarterectomy, CHF, CAD s/p CABG, on chronic anticoagulation, immunotherapy for recurrent squamous cell carcinoma.   OT comments  Chart reviewed, pt greeted in bed, agreeable to OT tx session targeting improving functional activity tolerance in the setting of ADL tasks. MOD A required for UB bathing/dressing, MAX-TOTAL A for LB bathing/dressing. MAX A +1 required for supine<>sit, rolling with MOD A +2. Full linen change provided with NT. Frequent vcs throughout for technique. Pt sits on edge of bed with MIN A, progressing to CGA for approx 10 minutes. Pt is making slow progress towards goals, discharge remains appropriate. Pt is left in care of nursing, all needs met. OT will continue to follow acutely.       If plan is discharge home, recommend the following:  A lot of help with bathing/dressing/bathroom;Help with stairs or ramp for entrance;Two people to help with walking and/or transfers   Equipment Recommendations  Other (comment) (defer to next venue of care)    Recommendations for Other Services      Precautions / Restrictions Precautions Precautions: Fall Recall of Precautions/Restrictions: Impaired Restrictions Weight Bearing Restrictions Per Provider Order: No       Mobility Bed Mobility Overal bed mobility: Needs Assistance Bed Mobility: Supine to Sit, Sit to Supine, Rolling Rolling: Mod assist, Used rails, +2 for physical assistance Sidelying to sit: Mod assist Supine to sit: Max assist, HOB elevated, Used rails Sit to supine: Max assist, HOB elevated, Used rails   General bed mobility comments: frequent vcs for technique    Transfers                   General transfer comment:  pt with decreased activity tolerance after full bath, linen change, and sitting edge of bed for approx 10 min     Balance Overall balance assessment: Needs assistance Sitting-balance support: Bilateral upper extremity supported   Sitting balance - Comments: MIN A progress to CGA for approx 10 min                                   ADL either performed or assessed with clinical judgement   ADL Overall ADL's : Needs assistance/impaired Eating/Feeding: Set up;Minimal assistance Eating/Feeding Details (indicate cue type and reason): take PO meds with  nursing, intermittent vcs for posititoning Grooming: Wash/dry face;Supervision/safety   Upper Body Bathing: Moderate assistance;Bed level   Lower Body Bathing: Maximal assistance;Bed level   Upper Body Dressing : Moderate assistance;Bed level Upper Body Dressing Details (indicate cue type and reason): doff/donn gown Lower Body Dressing: Maximal assistance;Total assistance Lower Body Dressing Details (indicate cue type and reason): donn/doff prevlon boots     Toileting- Clothing Manipulation and Hygiene: Maximal assistance;Total assistance;Bed level              Extremity/Trunk Assessment              Vision       Perception     Praxis     Communication Communication Communication: Impaired Factors Affecting Communication: Hearing impaired   Cognition Arousal: Alert Behavior During Therapy: WFL for tasks assessed/performed Cognition: No family/caregiver present to determine baseline, Cognition impaired  Attention impairment (select first level of impairment): Selective attention Executive functioning impairment (select all impairments): Problem solving                   Following commands: Impaired Following commands impaired: Follows one step commands with increased time      Cueing   Cueing Techniques: Verbal cues, Tactile cues  Exercises Other Exercises Other Exercises: edu  re: role of OT, role of reahb    Shoulder Instructions       General Comments vss throughout, nursing replaced mepilex on back and sacrum    Pertinent Vitals/ Pain       Pain Assessment Pain Assessment: 0-10 Pain Score: 0-No pain  Home Living                                          Prior Functioning/Environment              Frequency  Min 2X/week        Progress Toward Goals  OT Goals(current goals can now be found in the care plan section)  Progress towards OT goals: Progressing toward goals  Acute Rehab OT Goals Time For Goal Achievement: 04/05/24  Plan      Co-evaluation                 AM-PAC OT "6 Clicks" Daily Activity     Outcome Measure   Help from another person eating meals?: A Little Help from another person taking care of personal grooming?: A Little Help from another person toileting, which includes using toliet, bedpan, or urinal?: A Lot Help from another person bathing (including washing, rinsing, drying)?: A Lot Help from another person to put on and taking off regular upper body clothing?: A Little Help from another person to put on and taking off regular lower body clothing?: Total 6 Click Score: 14    End of Session Equipment Utilized During Treatment: Oxygen  OT Visit Diagnosis: Other abnormalities of gait and mobility (R26.89);Unsteadiness on feet (R26.81);Muscle weakness (generalized) (M62.81)   Activity Tolerance Patient limited by fatigue   Patient Left in bed;with call bell/phone within reach;with nursing/sitter in room   Nurse Communication Mobility status        Time: 0981-1914 OT Time Calculation (min): 25 min  Charges: OT General Charges $OT Visit: 1 Visit OT Treatments $Self Care/Home Management : 23-37 mins  Oleta Mouse, OTD OTR/L  03/29/24, 10:27 AM

## 2024-03-29 NOTE — TOC Progression Note (Addendum)
 Transition of Care [facility]) - Progression Note    Patient Details  Name: [name] [name] MRN: [id number] Date of Birth: 10/13/1929  Transition of Care [facility] CM/SW Contact  Margarito Liner, LCSW Phone Number: 03/29/2024, 11:32 AM  Clinical Narrative:   CSW spoke to ALF nurse yesterday who confirmed they cannot accept patient back due to level of assistance he needs. She stated family is interested in the hospice home. CSW called daughter today and confirmed. [facility] liaison will assess. Discussed potential for SNF with hospice if needed.   2:27 pm: LTC SNF with hospice referral has been sent out to local facilities.  Expected Discharge Plan and Services                                               Social Determinants of Health (SDOH) Interventions SDOH Screenings   Food Insecurity: No Food Insecurity (03/15/2024)  Housing: Low Risk  (03/15/2024)  Transportation Needs: No Transportation Needs (03/15/2024)  Utilities: Not At Risk (03/15/2024)  Depression (PHQ2-9): Low Risk  (02/09/2024)  Financial Resource Strain: Low Risk  (09/22/2023)   Received from [facility]  Social Connections: Moderately Isolated (03/15/2024)  Tobacco Use: Low Risk  (03/15/2024)    Readmission Risk Interventions     No data to display

## 2024-03-29 NOTE — Progress Notes (Signed)
 Nutrition Follow Up Note   DOCUMENTATION CODES:   Severe malnutrition in context of chronic illness  INTERVENTION:   Ensure Enlive po TID, each supplement provides 350 kcal and 20 grams of protein.  Magic cup TID with meals, each supplement provides 290 kcal and 9 grams of protein  MVI po daily   Vitamin C 500mg  po BID   Pt at high refeed risk; recommend monitor potassium, magnesium and phosphorus labs daily until stable  Daily weights   NUTRITION DIAGNOSIS:   Severe Malnutrition related to chronic illness as evidenced by severe fat depletion, severe muscle depletion. -ongoing   GOAL:   Patient will meet greater than or equal to 90% of their needs -not met   MONITOR:   PO intake, Supplement acceptance, Labs, Weight trends, I & O's, Skin   ASSESSMENT:   89 y.o. male with medical history significant for severe PAD s/p multiple revascularizations most recently 01/22/2024 for critical limb ischemia and on chronic anticoagulation with Eliquis, PAF, systolic CHF secondary to ischemic cardiomyopathy, GERD, CAD s/p CABG times in 1999, HTN and SCC of the cheek currently on immunotherapy who is admitted with SBO now s/p exploratory laparotomy and reduction of small bowel volvulus 3/18 complicated by left retroperitoneal hematoma and shock.  Pt continues to have poor appetite and oral intake in hospital; pt eating <50% of most meals and often eats just sips and bites. Pt is being offered Ensure and Magic Cups with inconsistent intake. Pt remains at high refeed risk. Per chart, pt remains up ~22lbs from his UBW if his bed weights are correct. Palliative care is following; plan is for hospice at discharge.      Medications reviewed and include: vitamin C, colace, synthroid, MVI, protonix, senokot, thiamine  Labs reviewed: Na 131(L), K 4.0 wnl, P 2.7 wnl Mg 2.0 wnl- 3/31 Wbc- 12.8(H), Hgb 7.9(L), Hct 24.4(L)  Diet Order:   Diet Order             DIET DYS 3 Room service appropriate?  Yes with Assist; Fluid consistency: Thin  Diet effective now                  EDUCATION NEEDS:   No education needs have been identified at this time  Skin:  Skin Assessment: Reviewed RN Assessment (Neck; full thickness; cancerous, Left heel; Unstageable Pressure Injury, incision abdomen)  Last BM:  4/1- type 6  Height:   Ht Readings from Last 1 Encounters:  03/15/24 5\' 7"  (1.702 m)    Weight:   Wt Readings from Last 1 Encounters:  03/29/24 72.4 kg    Ideal Body Weight:  67.2 kg  BMI:  Body mass index is 25 kg/m.  Estimated Nutritional Needs:   Kcal:  1600-1800kcal/day  Protein:  80-90g/day  Fluid:  1.6-1.8L/day  Betsey Holiday MS, RD, LDN If unable to be reached, please send secure chat to "RD inpatient" available from 8:00a-4:00p daily

## 2024-03-29 NOTE — NC FL2 (Signed)
 [place] MEDICAID FL2 LEVEL OF CARE FORM     IDENTIFICATION  Patient Name: [name] [name] Birthdate: June 10, 1929 Sex: male Admission Date (Current Location): 03/14/2024  [facility] and [place] Number:  Chiropodist and Address:  [facility], [address], [place], [place] [address]      Provider Number: [id number]  Attending Physician Name and Address:  Loyce Dys, MD  Relative Name and Phone Number:       Current Level of Care: Hospital Recommended Level of Care: Skilled Nursing Facility (with hospice) Prior Approval Number:    Date Approved/Denied:   PASRR Number: [id number]  Discharge Plan: SNF (with hospice)    Current Diagnoses: Patient Active Problem List   Diagnosis Date Noted   Nontraumatic retroperitoneal hematoma 03/21/2024   ABLA (acute blood loss anemia) 03/21/2024   Hemorrhagic shock (HCC) 03/21/2024   Protein-calorie malnutrition, severe 03/17/2024   Small bowel obstruction (HCC) 03/15/2024   PVD (peripheral vascular disease) (HCC) 03/15/2024   Ischemic cardiomyopathy 03/15/2024   Chronic anticoagulation 03/15/2024   Lactic acidosis 03/15/2024   History of revascularization procedure of lower extremity 03/15/2024   Recurrent squamous cell carcinoma in situ (SCCIS) of skin of cheek 02/09/2024   Symptomatic anemia 02/09/2024   Critical limb ischemia of left lower extremity (HCC) 01/21/2024   Pressure injury of skin of dorsum of left foot 09/27/2023   Fall 09/26/2023   HFrEF (heart failure with reduced ejection fraction) (HCC) 09/26/2023   Syncope 09/24/2023   Chronic heart failure with reduced ejection fraction (HFrEF, <= 40%) (HCC) 09/04/2021   Atherosclerosis of native arteries of the extremities with ulceration (HCC) 09/04/2021   Atherosclerosis of artery of extremity with rest pain (HCC) 08/30/2021   Atherosclerosis of native arteries of extremity with intermittent claudication (HCC) 02/18/2021   Cataract  cortical, senile 02/11/2021   Cor pulmonale, acute (HCC) 05/02/2020   Type 2 diabetes mellitus with peripheral angiopathy (HCC) 11/10/2018   Medicare annual wellness visit, initial 05/05/2017   Varicose veins of lower extremities with ulcer (HCC) 04/23/2017   Chronic venous insufficiency 04/23/2017   Leg pain 04/23/2017   Lymphedema 04/23/2017   A-fib (HCC) 04/23/2017   Hyperlipidemia 04/23/2017   Acquired hypothyroidism 11/03/2016   B12 deficiency 10/27/2014   CAD with history of CABG x3 (coronary artery disease) 04/25/2014   GERD (gastroesophageal reflux disease) 04/25/2014   HTN (hypertension), benign 04/25/2014   S/P CABG x 3 02/26/1993    Orientation RESPIRATION BLADDER Height & Weight     Self, Place  O2 (Nasal Cannula 2 L) Incontinent, External catheter Weight: 159 lb 9.8 oz (72.4 kg) Height:  5\' 7"  (170.2 cm)  BEHAVIORAL SYMPTOMS/MOOD NEUROLOGICAL BOWEL NUTRITION STATUS   (None)  (None) Incontinent Diet (DYS 3. Please chop meats and add Gravies. May have Baked/Sweet potatoes per Speech ok. No straws.)  AMBULATORY STATUS COMMUNICATION OF NEEDS Skin   Extensive Assist Verbally Skin abrasions, Bruising, Surgical wounds, Other (Comment) (Erythema/redness. Unstageable pressure injury on left heel (gauze daily), Incision on abdomen (no dressing), and skin tear on right arm (gauze every other day).)                       Personal Care Assistance Level of Assistance  Bathing, Feeding, Dressing Bathing Assistance: Maximum assistance Feeding assistance: Limited assistance Dressing Assistance: Maximum assistance     Functional Limitations Info  Sight, Speech, Hearing Sight Info: Adequate Hearing Info: Adequate Speech Info: Adequate    SPECIAL CARE FACTORS  FREQUENCY                      Contractures Contractures Info: Not present    Additional Factors Info  Code Status, Allergies Code Status Info: DNR Allergies Info: Cephalexin, Spironolactone, Codeine,  Doxycyline           Current Medications (03/29/2024):  This is the current hospital active medication list Current Facility-Administered Medications  Medication Dose Route Frequency Provider Last Rate Last Admin   acetaminophen (TYLENOL) tablet 650 mg  650 mg Oral Q6H PRN Loyce Dys, MD       ascorbic acid (VITAMIN C) tablet 500 mg  500 mg Oral BID Esaw Grandchild A, DO   500 mg at 03/29/24 1003   Chlorhexidine Gluconate Cloth 2 % PADS 6 each  6 each Topical Q2200 Vida Rigger, MD   6 each at 03/28/24 2120   docusate sodium (COLACE) capsule 100 mg  100 mg Oral Daily Kandis Cocking, MD   100 mg at 03/29/24 1003   feeding supplement (ENSURE ENLIVE / ENSURE PLUS) liquid 237 mL  237 mL Oral TID BM Esaw Grandchild A, DO   237 mL at 03/29/24 1303   leptospermum manuka honey (MEDIHONEY) paste 1 Application  1 Application Topical Daily Kathrynn Running, MD   1 Application at 03/28/24 1406   levothyroxine (SYNTHROID) tablet 112 mcg  112 mcg Oral Q0600 Kandis Cocking, MD   112 mcg at 03/29/24 9811   morphine (PF) 2 MG/ML injection 2 mg  2 mg Intravenous Q2H PRN Kandis Cocking, MD   2 mg at 03/23/24 0046   multivitamin with minerals tablet 1 tablet  1 tablet Oral Daily Esaw Grandchild A, DO   1 tablet at 03/29/24 1003   ondansetron (ZOFRAN) injection 4 mg  4 mg Intravenous Q4H PRN Kandis Cocking, MD   4 mg at 03/21/24 0540   pantoprazole (PROTONIX) EC tablet 40 mg  40 mg Oral Daily Mila Merry A, RPH   40 mg at 03/29/24 1003   polyethylene glycol (MIRALAX / GLYCOLAX) packet 17 g  17 g Oral Daily PRN Kandis Cocking, MD   17 g at 03/29/24 1003   senna (SENOKOT) tablet 8.6 mg  1 tablet Oral QHS Kandis Cocking, MD   8.6 mg at 03/28/24 2037   simvastatin (ZOCOR) tablet 40 mg  40 mg Oral QPM Kathrynn Running, MD   40 mg at 03/28/24 1724   thiamine (VITAMIN B1) tablet 100 mg  100 mg Oral Daily Mila Merry A, RPH   100 mg at 03/29/24 1003     Discharge Medications: Please see  discharge summary for a list of discharge medications.  Relevant Imaging Results:  Relevant Lab Results:   Additional Information SS#: SS#: [id number]. Was living at [facility] prior to admission but they cannot manage the level of assistance he needs.  Margarito Liner, LCSW

## 2024-03-29 NOTE — Progress Notes (Signed)
 [facility] SURGICAL PROGRESS NOTE  Hospital Day(s): 14.   Post op day(s): 14 Days Post-Op.   Interval History:  Patient seen and examined No issues overnight Patient sitting up in bed No abdominal complaints No fever, chills, nausea, emesis Renal function normal; sCr - 0.60; UO - 700 ccs Mild hyponatremia to 131 o/w no electrolyte derangements He is on soft diet; tolerating He reports he is having bowel function   Vital signs in last 24 hours: [min-max] current  Temp:  [97.9 F (36.6 C)-98.4 F (36.9 C)] 98.4 F (36.9 C) (04/01 0700) Pulse Rate:  [81-87] 84 (04/01 0700) Resp:  [19-20] 20 (04/01 0700) BP: (102-122)/(50-64) 102/50 (04/01 0700) SpO2:  [95 %-97 %] 96 % (04/01 0700) Weight:  [72.4 kg] 72.4 kg (04/01 0500)     Height: 5\' 7"  (170.2 cm) Weight: 72.4 kg BMI (Calculated): 24.99   Intake/Output last 2 shifts:  03/31 0701 - 04/01 0700 In: -  Out: 700 [Urine:700]   Physical Exam:  Constitutional: alert, cooperative and no distress  Respiratory: breathing non-labored at rest; on  Gastrointestinal: soft, non-tender, non-distended, no rebound/guarding  Integumentary: Laparotomy is CDI with staples (removed), ecchymotic expectedly no erythema or drainage   Labs:     Latest Ref Rng & Units 03/26/2024    4:56 AM 03/25/2024    5:00 AM 03/24/2024    5:34 AM  CBC  WBC 4.0 - 10.5 K/uL 12.8  11.8  12.9   Hemoglobin 13.0 - 17.0 g/dL 7.9  7.5  8.2   Hematocrit 39.0 - 52.0 % 24.4  22.2  25.0   Platelets 150 - 400 K/uL 221  195  185       Latest Ref Rng & Units 03/29/2024    4:29 AM 03/28/2024    5:19 AM 03/27/2024    4:32 AM  CMP  Glucose 70 - 99 mg/dL 89  98  95   BUN 8 - 23 mg/dL 19  20  22    Creatinine 0.61 - 1.24 mg/dL 1.61  0.96  0.45   Sodium 135 - 145 mmol/L 131  132  131   Potassium 3.5 - 5.1 mmol/L 4.0  4.1  4.1   Chloride 98 - 111 mmol/L 98  96  97   CO2 22 - 32 mmol/L 25  28  28    Calcium 8.9 - 10.3 mg/dL 7.7  7.9  7.7      Imaging  studies: No new pertinent imaging studies   Assessment/Plan: 89 y.o. male 14 Days Post-Op s/p exploratory laparotomy and reduction of small bowel volvulus with hemorrhagic/edematous but viable bowel, complicated by pertinent comorbidities including advanced age, deconditioning, significant cardiac history on Eliquis, and SCC currently on immunotherapy.    - Okay to continue soft diet  - Continue gentle bowel regimen  - Monitor abdominal examination; on-going bowel function  - Staples removed; steri-strips placed - these will fall off on their own in a few days  - Appreciate palliative medicine assistance/recommendations  - Mobilize with therapies as feasible  - Further management per primary service   - Nothing further from surgical perspective, we will sign off. Okay for discharge once medically clear/placement. Please call with questions/concerns. If needed, he can follow up in 3-4 weeks as outpatient   All of the above findings and recommendations were discussed with the patient, and the medical team, and all of patient's questions were answered to his expressed satisfaction.  -- Lynden Oxford, PA-C [facility] 03/29/2024, 8:13 AM  M-F: 7am - 4pm

## 2024-03-29 NOTE — Consult Note (Signed)
 PHARMACY CONSULT NOTE - ELECTROLYTES  Pharmacy Consult for Electrolyte Monitoring and Replacement   Recent Labs: Height: 5\' 7"  (170.2 cm) Weight: 72.4 kg (159 lb 9.8 oz) IBW/kg (Calculated) : 66.1 Estimated Creatinine Clearance: 55.1 mL/min (A) (by C-G formula based on SCr of 0.6 mg/dL (L)).  Potassium (mmol/L)  Date Value  03/29/2024 4.0  02/04/2015 4.2   Magnesium (mg/dL)  Date Value  40/98/1191 2.0   Calcium (mg/dL)  Date Value  47/82/9562 7.7 (L)   Calcium, Total (mg/dL)  Date Value  13/07/6577 8.5   Albumin (g/dL)  Date Value  46/96/2952 2.2 (L)  02/04/2015 3.5   Phosphorus (mg/dL)  Date Value  84/13/2440 2.7   Sodium (mmol/L)  Date Value  03/29/2024 131 (L)  02/04/2015 141    Corrected Ca: 9.1       (Ca 7.7   albumin 2.2)  Assessment  [name] [name] is a 89 y.o. male presenting with a small bowel obstruction. PMH significant for severe PAD, systolic CHF secondary to ischemic cardiomyopathy, EF 35 to 40% 08/2023, CAD s/p CABG times 03/16/1998, and HTN. Pharmacy has been consulted to monitor and replace electrolytes. Patient is status post exploratory lap with lysis of adhesions 03/15/24.  Volvulus with hemorrhagic bowel encountered, no resection performed. Retroperitoneal hematoma- Hx CABGx3-Currently off ASA/Plavix due to bleeding   Diet: dysphagia 3 MIVF: none  Goal of Therapy: Electrolytes WNL  Plan:  No electrolyte replacement needed at this time F/u with next labs.   Thank you for involving pharmacy in this patient's care.   Angelique Blonder, PharmD Clinical Pharmacist 03/29/2024 12:29 PM

## 2024-03-29 NOTE — Progress Notes (Signed)
 Palliative Care Progress Note, Assessment & Plan   Patient Name: [name] [name]       Date: 03/29/2024 DOB: 01/27/1929  Age: 89 y.o. MRN#: [id number] Attending Physician: Loyce Dys, MD Primary Care Physician: [name], MD Admit Date: 03/14/2024  Subjective: Patient is sitting up in bed in no apparent distress.  He acknowledges my presence and is able to make his wishes known.  He is requesting that I remove his boot so he can get up and walk.  No family or friends present during my visit.  HPI: 89 y.o. male  with past medical history of severe PAD s/p multiple revascularizations (most recent-01/22/2024 for critical limb ischemia) on chronic anticoagulation-Eliquis, systolic CHF secondary to ischemic cardiomyopathy (EF 35 to 40% on 08/2023), CAD s/p CABG x 3 (1999), and HTN admitted on 03/14/2024 with nausea and vomiting with lower abdominal pain.  Patient has been diagnosed with SBO.   3/18, patient underwent ISIS of adhesion.  Volvulus with hemorrhagic bowel encountered at but no resection performed.   3/23, CT of abdomen and pelvis revealed retroperitoneal hematoma active extravasation. Patient given 2 units RBCs.   Evening of 3/23, patient became hypotensive but asymptomatic.  Patient transferred to ICU.    PMT was consulted to support patient and family with goals of care discussions.   Summary of counseling/coordination of care: Extensive chart review completed prior to meeting patient including labs, vital signs, imaging, progress notes, orders, and available advanced directive documents from current and previous encounters.   After reviewing the patient's chart and assessing the patient at bedside, I spoke with patient in regards to symptom management and goals of care.   Symptoms  assessed.  Patient shares that he is comfortable and has no complaints at this time.  He is requesting help with getting up and out of bed.  Discussed that he is frail and needs significant assistance to move out of bed.  He is asking if he can remove his boots.  Discussed boots are in place to help prevent skin breakdown.  He seemed surprised by this answer but was appreciative of our discussion.  I made it clear that patient needs extensive assistance to get out of bed.  No adjustment to [facility] at this time.  When asked his understanding of why he is in the hospital, patient shares that he is not very sure.  He shares that I should speak with his daughter.  After visiting with the patient, I spoke with his daughter [name] over the phone.  [name] shares concern that patient does not have a safe discharge plan.  She is awaiting a callback from [facility] in regards to next steps.  I again outlined goals and values important to patient and [name].  [name] continues to endorse DNR with limited interventions.  She does not wish for any artificial means to be used to sustain her father's life.  Reviewed that patient's functional status has declined over the past 14 days of hospitalization.  His nutrition is also become poor.  She shares understanding and wishes for him to be able to be at the hospice inpatient unit.  However, he does not qualify at this time.  She  is hopeful patient can discharge to a facility with hospice services to follow.  Goals and wishes are clear but disposition is a barrier at this time.  TOC following closely.  PMT will continue to follow and support patient and family throughout this hospitalization.  Physical Exam Vitals reviewed.  Constitutional:      General: He is not in acute distress. HENT:     Mouth/Throat:     Mouth: Mucous membranes are moist.  Eyes:     Pupils: Pupils are equal, round, and reactive to light.  Cardiovascular:     Rate and Rhythm: Normal rate.  Pulmonary:      Effort: Pulmonary effort is normal.  Abdominal:     Palpations: Abdomen is soft.  Musculoskeletal:     Comments: Generalized weakness  Skin:    General: Skin is warm and dry.     Coloration: Skin is pale.     Comments: UTA - LE dressings clean, dry, intact  Neurological:     Mental Status: He is alert.     Comments: Oriented to self and place  Psychiatric:        Mood and Affect: Mood normal.        Behavior: Behavior normal.             Total Time 35 minutes   Time spent includes: Detailed review of medical records (labs, imaging, vital signs), medically appropriate exam (mental status, respiratory, cardiac, skin), discussed with treatment team, counseling and educating patient, family and staff, documenting clinical information, medication management and coordination of care.  Samara Deist L. Bonita Quin, DNP, FNP-BC Palliative Medicine Team

## 2024-03-29 NOTE — Progress Notes (Signed)
 [facility] Liaison Note  Received request from Transitions of Care Manger for family interest in [facility].  Spoke with daughter  to confirm interest and explain services. At this time, patient does not qualify for the inpatient unit.  No current symptom management needs and is not actively transitioning to end of life (life expectancy of 2-3 days or less)  Transitions of Care manager aware.  Will continue to monitor patient's condition to see if changes occur that would possibly qualify patient for the IPU.  Please call with any Hospice related questions or concerns.    Thank you for the opportunity to participate in this patient's care.    Redge Gainer,  [facility] Liaison [phone]

## 2024-03-30 DIAGNOSIS — K559 Vascular disorder of intestine, unspecified: Secondary | ICD-10-CM | POA: Diagnosis not present

## 2024-03-30 DIAGNOSIS — K56609 Unspecified intestinal obstruction, unspecified as to partial versus complete obstruction: Secondary | ICD-10-CM | POA: Diagnosis not present

## 2024-03-30 DIAGNOSIS — Z515 Encounter for palliative care: Secondary | ICD-10-CM | POA: Diagnosis not present

## 2024-03-30 DIAGNOSIS — I5022 Chronic systolic (congestive) heart failure: Secondary | ICD-10-CM | POA: Diagnosis not present

## 2024-03-30 LAB — CBC WITH DIFFERENTIAL/PLATELET
Abs Immature Granulocytes: 0.08 10*3/uL — ABNORMAL HIGH (ref 0.00–0.07)
Basophils Absolute: 0.1 10*3/uL (ref 0.0–0.1)
Basophils Relative: 1 %
Eosinophils Absolute: 0.2 10*3/uL (ref 0.0–0.5)
Eosinophils Relative: 2 %
HCT: 25.6 % — ABNORMAL LOW (ref 39.0–52.0)
Hemoglobin: 8.6 g/dL — ABNORMAL LOW (ref 13.0–17.0)
Immature Granulocytes: 1 %
Lymphocytes Relative: 10 %
Lymphs Abs: 0.9 10*3/uL (ref 0.7–4.0)
MCH: 30.8 pg (ref 26.0–34.0)
MCHC: 33.6 g/dL (ref 30.0–36.0)
MCV: 91.8 fL (ref 80.0–100.0)
Monocytes Absolute: 1 10*3/uL (ref 0.1–1.0)
Monocytes Relative: 11 %
Neutro Abs: 7 10*3/uL (ref 1.7–7.7)
Neutrophils Relative %: 75 %
Platelets: 247 10*3/uL (ref 150–400)
RBC: 2.79 MIL/uL — ABNORMAL LOW (ref 4.22–5.81)
RDW: 19.6 % — ABNORMAL HIGH (ref 11.5–15.5)
WBC: 9.3 10*3/uL (ref 4.0–10.5)
nRBC: 0 % (ref 0.0–0.2)

## 2024-03-30 LAB — BASIC METABOLIC PANEL WITH GFR
Anion gap: 6 (ref 5–15)
BUN: 17 mg/dL (ref 8–23)
CO2: 25 mmol/L (ref 22–32)
Calcium: 7.6 mg/dL — ABNORMAL LOW (ref 8.9–10.3)
Chloride: 97 mmol/L — ABNORMAL LOW (ref 98–111)
Creatinine, Ser: 0.53 mg/dL — ABNORMAL LOW (ref 0.61–1.24)
GFR, Estimated: >60 mL/min (ref >60)
Glucose, Bld: 86 mg/dL (ref 70–99)
Potassium: 3.9 mmol/L (ref 3.5–5.1)
Sodium: 128 mmol/L — ABNORMAL LOW (ref 135–145)

## 2024-03-30 NOTE — Plan of Care (Signed)
   Problem: Elimination: Goal: Will not experience complications related to bowel motility Outcome: Progressing Goal: Will not experience complications related to urinary retention Outcome: Progressing   Problem: Pain Managment: Goal: General experience of comfort will improve and/or be controlled Outcome: Progressing   Problem: Safety: Goal: Ability to remain free from injury will improve Outcome: Progressing

## 2024-03-30 NOTE — Progress Notes (Signed)
 Progress Note   Patient: [name] [name] MVH:[id number] DOB: 03/03/1929 DOA: 03/14/2024     15 DOS: the patient was seen and examined on 03/30/2024     Brief Narrative:    From admission h and p   [name] [name] is a 89 y.o. male with medical history significant for Severe PAD s/p multiple revascularization most recently 01/22/2024 for critical limb ischemia, and on chronic anticoagulation with Eliquis, systolic CHF secondary to ischemic cardiomyopathy, EF 35 to 40% 08/2023, CAD s/p CABG times 03/16/1998, HTN, who is being admitted with a small bowel obstruction, after presenting with nausea vomiting lower abdominal pain that started around noon on 3/17.  He had no diarrhea, no blood in the stool, no fever or chills.    Assessment & Plan:     # Small bowel obstruction status post surgery Patient is status post exploratory lap with lysis of adhesions 03/15/24.  Volvulus with hemorrhagic bowel encountered, no resection performed.  Bowel movement reported 3/23.  Continue current diet Patient currently cleared surgically for discharge Continue as needed pain medication Continue wound care   # Delirium S/p surgery , mildly confused, intermittent hallucinations Continue delirium precaution   # Acute blood loss anemia # Hemorrhagic shock # Retroperitoneal hematoma LEFT Status post 3 units of blood transfusion Monitor CBC closely   # SCC left neck --Wound care RN recs placed 3/19 Outpatient follow-up with oncology   # Ulcer left heel Chronic, followed by wound care as outpt, daughter says slowly improving Continue wound care   Hyponatremia Monitor sodium level closely   # CAD Hx cabg x3, stable - no chest pain Continue statin therapy Currently off ASA/Plavix due to bleeding   # Hypothyroid Continue Synthroid   # Chronic HFrEF Currently off Lasix metoprolol given hypotension   # Skin tear Right forearm, happened 3/21  Continue wound care   # A-fib HR's up in setting  of hemorrhagic shock Not on anticoagulation on account of retroperitoneal bleed     # PAD S/p multiple vascular procedures most recently January of this year --Hold aspirin and anticoagulation for now Continue statin therapy   # T2DM Well controlled. Last A1c 5.1    # R flank ecchymosis  Continue to monitor closely   # Debility Resides at [facility]. PT advises snf Awaiting rehab placement Palliative care on board and we appreciate input   #Goals of care --Code status DNR/DNI --Palliative Care consulted Family is agreeable with discharge with hospice     DVT prophylaxis: none - due to retroperitoneal hematoma and hemorrhagic shock   Code Status: dnr/dni   Family Communication: Discussed with daughter and son-in-law at bedside   Consultants:  General Surgery Palliative Care   Procedures: Ex lap     Subjective: Patient seen and examined at bedside this morning He denies worsening respiratory function chest pain or cough TOC still working on facility OSA patient on hospice   Physical Exam    General exam: awake, drowsy, no acute distress, frail HEENT: moist mucus membranes, hearing grossly normal  Respiratory system: CTAB, no wheezes, rales or rhonchi, normal respiratory effort. Cardiovascular system: normal S1/S2  RRR, no lower extremity edema.   Gastrointestinal system: midline surgical incision appears well without signs of bleeding or surrounding erythema swelling or other signs of infection Central nervous system: more alert & conversational, A&Ox2+ no gross focal neurologic deficits, normal speech Extremities: LUE edema, offloading boots on feet, no BLE edema Skin: color is improved, minimal ecchymosis of  LEFT flank, scattered ecchymosis of extremities   Disposition: TOC working on facility that is able to accept patient on hospice  Data Reviewed:       Latest Ref Rng & Units 03/30/2024    5:19 AM 03/26/2024    4:56 AM 03/25/2024    5:00 AM  CBC   WBC 4.0 - 10.5 K/uL 9.3  12.8  11.8   Hemoglobin 13.0 - 17.0 g/dL 8.6  7.9  7.5   Hematocrit 39.0 - 52.0 % 25.6  24.4  22.2   Platelets 150 - 400 K/uL 247  221  195        Latest Ref Rng & Units 03/30/2024    5:19 AM 03/29/2024    4:29 AM 03/28/2024    5:19 AM  BMP  Glucose 70 - 99 mg/dL 86  89  98   BUN 8 - 23 mg/dL 17  19  20    Creatinine 0.61 - 1.24 mg/dL 7.82  9.56  2.13   Sodium 135 - 145 mmol/L 128  131  132   Potassium 3.5 - 5.1 mmol/L 3.9  4.0  4.1   Chloride 98 - 111 mmol/L 97  98  96   CO2 22 - 32 mmol/L 25  25  28    Calcium 8.9 - 10.3 mg/dL 7.6  7.7  7.9        Vitals:   03/29/24 1538 03/29/24 2025 03/30/24 0453 03/30/24 0750  BP: 112/75 106/62 (!) 115/56 (!) 108/57  Pulse: 84 84 79 78  Resp: (!) 22 16 16 18   Temp: 98.2 F (36.8 C) 98.2 F (36.8 C) 98.4 F (36.9 C) 97.9 F (36.6 C)  TempSrc:  Oral Oral Oral  SpO2: 99% 94% 95% 96%  Weight:      Height:         Author: Loyce Dys, MD 03/30/2024 1:00 PM  For on call review [email or web address].

## 2024-03-30 NOTE — Progress Notes (Signed)
 Palliative Care Progress Note, Assessment & Plan   Patient Name: [name] [name]       Date: 03/30/2024 DOB: 05/22/1929  Age: 89 y.o. MRN#: [id number] Attending Physician: Loyce Dys, MD Primary Care Physician: [name], MD Admit Date: 03/14/2024  Subjective: Patient is sitting up in bed in no apparent distress.  He is awake and alert.  He acknowledges my presence and is able to make his wishes known.  Nurse tech is at bedside helping prepare his breakfast tray/set him up.  No family or friends present during my visit.  HPI: 89 y.o. male  with past medical history of severe PAD s/p multiple revascularizations (most recent-01/22/2024 for critical limb ischemia) on chronic anticoagulation-Eliquis, systolic CHF secondary to ischemic cardiomyopathy (EF 35 to 40% on 08/2023), CAD s/p CABG x 3 (1999), and HTN admitted on 03/14/2024 with nausea and vomiting with lower abdominal pain.  Patient has been diagnosed with SBO.   3/18, patient underwent ISIS of adhesion.  Volvulus with hemorrhagic bowel encountered at but no resection performed.   3/23, CT of abdomen and pelvis revealed retroperitoneal hematoma active extravasation. Patient given 2 units RBCs.   Evening of 3/23, patient became hypotensive but asymptomatic.  Patient transferred to ICU.    PMT was consulted to support patient and family with goals of care discussions.   Summary of counseling/coordination of care: Extensive chart review completed prior to meeting patient including labs, vital signs, imaging, progress notes, orders, and available advanced directive documents from current and previous encounters.   After reviewing the patient's chart and assessing the patient at bedside, I spoke with patient in regards to symptom management and goals  of care.   Symptoms assessed.  Patient endorses that he feels well and has no complaints at this time.  He denies chest pain, headache, n/v, or other acute issues. No adjustment to [facility] needed.  I attempted to gauge patient's understanding of his current medical situation and plan of care.  He shares that he is going to work on eating his food.  He shares he normally has help to help set him up.  I shared that nurse tech had just left the room after helping set up his breakfast.  He says he did not realize that.  I again asked for his understanding of why he is in the hospital.  He said he knows he is not well but is not really sure why now.  Patient unable to engage in goals of care medical decision making independently at this time.  Discussed importance and nutrition and patient's overall prognosis.  He shares that he does not eat a lot at once but likes to take several bites of things throughout the day.  Encouraged p.o. intake and hydration.  Patient was understanding of the need to keep up his nutrition in order to keep up his strength and functional status.  Plan remains for patient to discharge to LTC SNF bed if/when available.  TOC following closely.  Awaiting bed offers.  No acute palliative needs at this time.  PMT will continue to follow him support patient throughout his hospitalization.  Physical Exam Vitals reviewed.  Constitutional:  Comments: Thin, frail  HENT:     Head:     Comments: Temporal wasting    Mouth/Throat:     Mouth: Mucous membranes are moist.  Eyes:     Pupils: Pupils are equal, round, and reactive to light.  Cardiovascular:     Rate and Rhythm: Normal rate.  Pulmonary:     Effort: Pulmonary effort is normal.  Abdominal:     Palpations: Abdomen is soft.  Musculoskeletal:     Comments: Generalized weakness  Skin:    General: Skin is warm and dry.     Comments: Unna boots in place  Neurological:     Mental Status: He is alert.     Comments:  Oriented to self and place  Psychiatric:        Mood and Affect: Mood normal.        Behavior: Behavior normal.        Judgment: Judgment normal.             Total Time 25 minutes   Time spent includes: Detailed review of medical records (labs, imaging, vital signs), medically appropriate exam (mental status, respiratory, cardiac, skin), discussed with treatment team, counseling and educating patient, family and staff, documenting clinical information, medication management and coordination of care.  Samara Deist L. Bonita Quin, DNP, FNP-BC Palliative Medicine Team

## 2024-03-30 NOTE — Plan of Care (Signed)
   Problem: Coping: Goal: Level of anxiety will decrease Outcome: Progressing   Problem: Pain Managment: Goal: General experience of comfort will improve and/or be controlled Outcome: Progressing   Problem: Safety: Goal: Ability to remain free from injury will improve Outcome: Progressing

## 2024-03-30 NOTE — Consult Note (Signed)
 PHARMACY CONSULT NOTE - ELECTROLYTES  Pharmacy Consult for Electrolyte Monitoring and Replacement   Recent Labs: Height: 5\' 7"  (170.2 cm) Weight: 72.4 kg (159 lb 9.8 oz) IBW/kg (Calculated) : 66.1 Estimated Creatinine Clearance: 55.1 mL/min (A) (by C-G formula based on SCr of 0.53 mg/dL (L)).  Potassium (mmol/L)  Date Value  03/30/2024 3.9  02/04/2015 4.2   Magnesium (mg/dL)  Date Value  40/98/1191 2.0   Calcium (mg/dL)  Date Value  47/82/9562 7.6 (L)   Calcium, Total (mg/dL)  Date Value  13/07/6577 8.5   Albumin (g/dL)  Date Value  46/96/2952 2.2 (L)  02/04/2015 3.5   Phosphorus (mg/dL)  Date Value  84/13/2440 2.7   Sodium (mmol/L)  Date Value  03/30/2024 128 (L)  02/04/2015 141    Corrected Ca: 9.0       (Ca 7.6   albumin 2.2)  Assessment  [name] [name] is a 89 y.o. male presenting with a small bowel obstruction. PMH significant for severe PAD, systolic CHF secondary to ischemic cardiomyopathy, EF 35 to 40% 08/2023, CAD s/p CABG times 03/16/1998, and HTN. Pharmacy has been consulted to monitor and replace electrolytes. Patient is status post exploratory lap with lysis of adhesions 03/15/24.  Volvulus with hemorrhagic bowel encountered, no resection performed. Retroperitoneal hematoma- Hx CABGx3-Currently off ASA/Plavix due to bleeding   Diet: dysphagia 3 MIVF: none  Goal of Therapy: Electrolytes WNL  Plan:  No electrolyte replacement at this time  F/u with am labs.   Thank you for involving pharmacy in this patient's care.   Angelique Blonder, PharmD Clinical Pharmacist 03/30/2024 11:20 AM

## 2024-03-30 NOTE — Progress Notes (Addendum)
 Physical Therapy Re-Evaluation & Treatment Patient Details Name: [name] [name] MRN: [id number] DOB: 1929-03-03 Today's Date: 03/30/2024  History of Present Illness  Pt admitted for SBO s/p exploratory laparotomy. Left RP bleed with transfer to ICU. PMHx of critical limb ischemia s/p femoral endarterectomy, CHF, CAD s/p CABG, on chronic anticoagulation, immunotherapy for recurrent squamous cell carcinoma.  Clinical Impression  Patient seen for re-evaluation this date. Requires maxA for bed mobility with minA initially to maintain sitting balance but able to progress to CGA. Able to perform seated exercises with good tolerance. Returned to bed for linen change due to urinary incontinence. Found to have had bowel movement. Required modA+2 for rolling for linen change. Discharge plan remains appropriate.         If plan is discharge home, recommend the following: Two people to help with walking and/or transfers;A lot of help with bathing/dressing/bathroom;Help with stairs or ramp for entrance;Supervision due to cognitive status   Can travel by private vehicle   No    Equipment Recommendations None recommended by PT  Recommendations for Other Services       Functional Status Assessment       Precautions / Restrictions Precautions Precautions: Fall Recall of Precautions/Restrictions: Impaired Restrictions Weight Bearing Restrictions Per Provider Order: No      Mobility  Bed Mobility Overal bed mobility: Needs Assistance Bed Mobility: Supine to Sit, Sit to Supine, Rolling Rolling: Mod assist, +2 for physical assistance, +2 for safety/equipment   Supine to sit: Max assist, HOB elevated, Used rails Sit to supine: Max assist, HOB elevated, Used rails   General bed mobility comments: found to have bowel movement after rolling for linen change    Transfers                        Ambulation/Gait                  Stairs            Wheelchair Mobility      Tilt Bed    Modified Rankin (Stroke Patients Only)       Balance Overall balance assessment: Needs assistance Sitting-balance support: Bilateral upper extremity supported Sitting balance-Leahy Scale: Poor Sitting balance - Comments: minA progressing to CGA                                     Pertinent Vitals/Pain Pain Assessment Pain Assessment: Faces Faces Pain Scale: Hurts even more Pain Location: L heel Pain Descriptors / Indicators: Discomfort, Grimacing Pain Intervention(s): Limited activity within patient's tolerance, Monitored during session, Repositioned    Home Living                          Prior Function                       Extremity/Trunk Assessment                Communication   Communication Communication: Impaired Factors Affecting Communication: Hearing impaired    Cognition Arousal: Alert Behavior During Therapy: WFL for tasks assessed/performed   PT - Cognitive impairments: History of cognitive impairments                         Following commands: Impaired Following commands impaired: Follows one step commands  with increased time     Cueing       General Comments      Exercises General Exercises - Lower Extremity Long Arc Quad: AROM, Strengthening, Both, Seated, Limitations, 10 reps (through partial ROM (pain free)) Hip Flexion/Marching: AROM, Both, 10 reps, Seated Other Exercises Other Exercises: forward reaching to therapist hand x 5 (just outside of BOS anteriorly)   Assessment/Plan    PT Assessment    PT Problem List         PT Treatment Interventions      PT Goals (Current goals can be found in the Care Plan section)  Acute Rehab PT Goals Patient Stated Goal: to walk again PT Goal Formulation: With patient Time For Goal Achievement: 04/13/24 Potential to Achieve Goals: Fair    Frequency Min 1X/week     Co-evaluation               AM-PAC PT "6  Clicks" Mobility  Outcome Measure Help needed turning from your back to your side while in a flat bed without using bedrails?: A Lot Help needed moving from lying on your back to sitting on the side of a flat bed without using bedrails?: Total Help needed moving to and from a bed to a chair (including a wheelchair)?: Total Help needed standing up from a chair using your arms (e.g., wheelchair or bedside chair)?: Total Help needed to walk in hospital room?: Total Help needed climbing 3-5 steps with a railing? : Total 6 Click Score: 7    End of Session Equipment Utilized During Treatment: Oxygen Activity Tolerance: Patient limited by fatigue;Patient tolerated treatment well Patient left: in bed;with call bell/phone within reach;with nursing/sitter in room Nurse Communication: Mobility status PT Visit Diagnosis: Unsteadiness on feet (R26.81);Muscle weakness (generalized) (M62.81);History of falling (Z91.81);Difficulty in walking, not elsewhere classified (R26.2)    Time: 1610-9604 PT Time Calculation (min) (ACUTE ONLY): 18 min   Charges:   PT Evaluation $PT Re-evaluation: 1 Re-eval PT Treatments $Therapeutic Activity: 8-22 mins PT General Charges $$ ACUTE PT VISIT: 1 Visit         Maylon Peppers, PT, DPT Physical Therapist - [facility]  [facility]   Giavana Rooke A Jaiquan Temme 03/30/2024, 3:33 PM

## 2024-03-31 DIAGNOSIS — R1030 Lower abdominal pain, unspecified: Secondary | ICD-10-CM | POA: Diagnosis not present

## 2024-03-31 DIAGNOSIS — Z515 Encounter for palliative care: Secondary | ICD-10-CM | POA: Diagnosis not present

## 2024-03-31 DIAGNOSIS — K56609 Unspecified intestinal obstruction, unspecified as to partial versus complete obstruction: Secondary | ICD-10-CM | POA: Diagnosis not present

## 2024-03-31 LAB — RENAL FUNCTION PANEL
Albumin: 2.2 g/dL — ABNORMAL LOW (ref 3.5–5.0)
Anion gap: 5 (ref 5–15)
BUN: 21 mg/dL (ref 8–23)
CO2: 27 mmol/L (ref 22–32)
Calcium: 7.8 mg/dL — ABNORMAL LOW (ref 8.9–10.3)
Chloride: 100 mmol/L (ref 98–111)
Creatinine, Ser: 0.62 mg/dL (ref 0.61–1.24)
GFR, Estimated: >60 mL/min (ref >60)
Glucose, Bld: 86 mg/dL (ref 70–99)
Phosphorus: 2.7 mg/dL (ref 2.5–4.6)
Potassium: 4 mmol/L (ref 3.5–5.1)
Sodium: 132 mmol/L — ABNORMAL LOW (ref 135–145)

## 2024-03-31 LAB — MAGNESIUM: Magnesium: 2.1 mg/dL (ref 1.7–2.4)

## 2024-03-31 NOTE — Progress Notes (Signed)
 Occupational Therapy Treatment Patient Details Name: [name] [name] MRN: [id number] DOB: October 25, 1929 Today's Date: 03/31/2024   History of present illness Pt admitted for SBO s/p exploratory laparotomy. Left RP bleed with transfer to ICU. PMHx of critical limb ischemia s/p femoral endarterectomy, CHF, CAD s/p CABG, on chronic anticoagulation, immunotherapy for recurrent squamous cell carcinoma.   OT comments  Pt seen for OT/PT treatment on this date. Upon arrival to room pt moving around in bed, attempted to get comfortable, agreeable to tx. Pt a/o to self and place only. Pt requires MAXA +2 during x2 STS attempts from the EOB, pt often was resistive and pulling on author during standing attempts limiting access for physical assistance. Pt required bed level pericare, unable to tolerate standing pericare on this date. Pt demonstrated good UE strength when rolling to L and R side during bed level pericare, MAXA via verbal cues for sequencing during all mobility. Pt making good progress toward goals, will continue to follow POC. Discharge recommendation remains appropriate.        If plan is discharge home, recommend the following:  A lot of help with bathing/dressing/bathroom;Help with stairs or ramp for entrance;Two people to help with walking and/or transfers   Equipment Recommendations  Other (comment)    Recommendations for Other Services      Precautions / Restrictions Precautions Precautions: Fall Recall of Precautions/Restrictions: Impaired Restrictions Weight Bearing Restrictions Per Provider Order: No       Mobility Bed Mobility Overal bed mobility: Needs Assistance Bed Mobility: Supine to Sit, Rolling, Sit to Supine Rolling: Used rails, Min assist   Supine to sit: Max assist, +2 for safety/equipment Sit to supine: Max assist, +2 for safety/equipment   General bed mobility comments: Bed soiled on arrival to room, pt unaware. Bed level pericare (RN notified)     Transfers Overall transfer level: Needs assistance Equipment used: Rolling walker (2 wheels) Transfers: Sit to/from Stand Sit to Stand: Max assist, +2 safety/equipment           General transfer comment: Pt attempted STS x2  from EOB, unable to clear bed due to pt consistent resisting and pulling during standing attempts.     Balance Overall balance assessment: Needs assistance Sitting-balance support: Bilateral upper extremity supported Sitting balance-Leahy Scale: Poor     Standing balance support: Bilateral upper extremity supported, During functional activity Standing balance-Leahy Scale: Zero Standing balance comment: unable to stand on this date                           ADL either performed or assessed with clinical judgement   ADL Overall ADL's : Needs assistance/impaired Eating/Feeding: Set up;Minimal assistance Eating/Feeding Details (indicate cue type and reason): Unable to be orientated to meals, unaware of when last meal time             Upper Body Dressing : Moderate assistance;Sitting           Toileting- Clothing Manipulation and Hygiene: Maximal assistance;Bed level;Cueing for sequencing       Functional mobility during ADLs:  (Attempted STS unable to stand on this date, resistive to assistance) General ADL Comments: MINA for rolling R/L side, able to assist with UE via holding onto railings    Communication Communication Communication: Impaired Factors Affecting Communication: Hearing impaired   Cognition Arousal: Alert Behavior During Therapy: WFL for tasks assessed/performed Cognition: No family/caregiver present to determine baseline, Cognition impaired   Orientation impairments: Situation Awareness: Intellectual  awareness impaired Memory impairment (select all impairments): Short-term memory Attention impairment (select first level of impairment): Selective attention Executive functioning impairment (select all  impairments): Problem solving OT - Cognition Comments: A/O to name, place only                 Following commands: Impaired Following commands impaired: Follows one step commands with increased time      Cueing   Cueing Techniques: Verbal cues, Tactile cues  Exercises Exercises: Other exercises Other Exercises Other Exercises: Edu: STS technique with HHA +2, safe ADL completion    Shoulder Instructions       General Comments open pressure injury in scaral area, noted during bed level pericare (RN notified)    Pertinent Vitals/ Pain       Pain Assessment Pain Assessment: PAINAD Breathing: normal Negative Vocalization: none Facial Expression: smiling or inexpressive Body Language: relaxed Consolability: no need to console PAINAD Score: 0 Pain Location: L heel Pain Descriptors / Indicators: Discomfort, Grimacing Pain Intervention(s): Repositioned, Monitored during session, Limited activity within patient's tolerance  Home Living                                          Prior Functioning/Environment              Frequency  Min 2X/week        Progress Toward Goals  OT Goals(current goals can now be found in the care plan section)     Acute Rehab OT Goals Patient Stated Goal: improve strength OT Goal Formulation: With patient Time For Goal Achievement: 04/05/24 Potential to Achieve Goals: Fair  Plan      Co-evaluation    PT/OT/SLP Co-Evaluation/Treatment: Yes Reason for Co-Treatment: For patient/therapist safety;To address functional/ADL transfers   OT goals addressed during session: ADL's and self-care      AM-PAC OT "6 Clicks" Daily Activity     Outcome Measure   Help from another person eating meals?: A Little Help from another person taking care of personal grooming?: A Little Help from another person toileting, which includes using toliet, bedpan, or urinal?: A Lot Help from another person bathing (including washing,  rinsing, drying)?: A Lot Help from another person to put on and taking off regular upper body clothing?: A Little Help from another person to put on and taking off regular lower body clothing?: Total 6 Click Score: 14    End of Session    OT Visit Diagnosis: Other abnormalities of gait and mobility (R26.89);Unsteadiness on feet (R26.81);Muscle weakness (generalized) (M62.81)   Activity Tolerance Patient tolerated treatment well   Patient Left in bed;with call bell/phone within reach;with bed alarm set   Nurse Communication Mobility status        Time: 1311-1340 OT Time Calculation (min): 29 min  Charges: OT General Charges $OT Visit: 1 Visit OT Treatments $Self Care/Home Management : 8-22 mins  Glenard Haring M.S. OTR/L  03/31/24, 2:22 PM

## 2024-03-31 NOTE — Progress Notes (Signed)
  [facility] Liaison Note      Hospital Liaison will follow peripherally while LTC bed search continues.  Hospice will follow patient at a LTC facility.   Patient not IPU appropriate at this time.    Please call with any Hospice related questions or concerns  THank you for the opportunity to participate in this patient's care  [facility] Liaison [phone]

## 2024-03-31 NOTE — Progress Notes (Signed)
 Progress Note   Patient: [name] [name]:[id number] DOB: 16-Jan-1929 DOA: 03/14/2024     16 DOS: the patient was seen and examined on 03/31/2024      Brief Narrative:    From admission h and p   [name] [name] is a 89 y.o. male with medical history significant for Severe PAD s/p multiple revascularization most recently 01/22/2024 for critical limb ischemia, and on chronic anticoagulation with Eliquis, systolic CHF secondary to ischemic cardiomyopathy, EF 35 to 40% 08/2023, CAD s/p CABG times 03/16/1998, HTN, who is being admitted with a small bowel obstruction, after presenting with nausea vomiting lower abdominal pain that started around noon on 3/17.  He had no diarrhea, no blood in the stool, no fever or chills.    Assessment & Plan:     # Small bowel obstruction status post surgery Patient is status post exploratory lap with lysis of adhesions 03/15/24.  Volvulus with hemorrhagic bowel encountered, no resection performed.  Continue current diet Patient currently cleared surgically for discharge Continue as needed pain medication Continue wound care   # Delirium S/p surgery , mildly confused, intermittent hallucinations Continue delirium precaution   # Acute blood loss anemia # Hemorrhagic shock # Retroperitoneal hematoma LEFT Status post 3 units of blood transfusion Monitor CBC closely   # SCC left neck Continue wound care Outpatient follow-up with oncology   # Ulcer left heel Chronic, followed by wound care as outpt, daughter says slowly improving Continue wound care   Hyponatremia Monitor sodium level closely   # CAD Hx cabg x3, stable - no chest pain Continue statin therapy Currently off ASA/Plavix due to bleeding   # Hypothyroid Continue Synthroid   # Chronic HFrEF Currently off Lasix metoprolol given hypotension   # Skin tear Right forearm, happened 3/21  Continue wound care   # A-fib HR's up in setting of hemorrhagic shock Not on  anticoagulation on account of retroperitoneal bleed     # PAD S/p multiple vascular procedures most recently January of this year --Hold aspirin and anticoagulation for now Continue statin therapy   # T2DM Well controlled. Last A1c 5.1    # R flank ecchymosis  Continue to monitor closely   # Debility Resides at [facility]. PT advises snf Awaiting rehab placement Palliative care on board and we appreciate input   #Goals of care --Code status DNR/DNI --Palliative Care consulted Family is agreeable with discharge with hospice     DVT prophylaxis: none - due to retroperitoneal hematoma and hemorrhagic shock   Code Status: dnr/dni   Family Communication: Discussed with daughter and son-in-law at bedside   Consultants:  General Surgery Palliative Care   Procedures: Ex lap     Subjective: Patient seen and examined at bedside this morning TOC is working on facility will accept patient on hospice He denies nausea vomiting abdominal pain or chest pain   Physical Exam    General exam: awake, drowsy, no acute distress, frail HEENT: moist mucus membranes, hearing grossly normal  Respiratory system: CTAB, no wheezes, rales or rhonchi, normal respiratory effort. Cardiovascular system: normal S1/S2  RRR, no lower extremity edema.   Gastrointestinal system: midline surgical incision appears well without signs of bleeding or surrounding erythema swelling or other signs of infection Central nervous system: more alert & conversational, A&Ox2+ no gross focal neurologic deficits, normal speech Extremities: LUE edema, offloading boots on feet, no BLE edema Skin: color is improved, minimal ecchymosis of LEFT flank, scattered ecchymosis of extremities  Disposition: TOC working on facility that is able to accept patient on hospice   Data Reviewed:      Latest Ref Rng & Units 03/31/2024    5:05 AM 03/30/2024    5:19 AM 03/29/2024    4:29 AM  BMP  Glucose 70 - 99 mg/dL 86  86  89    BUN 8 - 23 mg/dL 21  17  19    Creatinine 0.61 - 1.24 mg/dL 1.61  0.96  0.45   Sodium 135 - 145 mmol/L 132  128  131   Potassium 3.5 - 5.1 mmol/L 4.0  3.9  4.0   Chloride 98 - 111 mmol/L 100  97  98   CO2 22 - 32 mmol/L 27  25  25    Calcium 8.9 - 10.3 mg/dL 7.8  7.6  7.7     Vitals:   03/30/24 1944 03/31/24 0358 03/31/24 0433 03/31/24 0940  BP: (!) 107/52 101/68  104/61  Pulse: 84 86  83  Resp: 20 16  20   Temp: 98.4 F (36.9 C) 98.4 F (36.9 C)  98.3 F (36.8 C)  TempSrc: Oral Oral  Oral  SpO2: 98% 95%  96%  Weight:   68 kg   Height:          Latest Ref Rng & Units 03/30/2024    5:19 AM 03/26/2024    4:56 AM 03/25/2024    5:00 AM  CBC  WBC 4.0 - 10.5 K/uL 9.3  12.8  11.8   Hemoglobin 13.0 - 17.0 g/dL 8.6  7.9  7.5   Hematocrit 39.0 - 52.0 % 25.6  24.4  22.2   Platelets 150 - 400 K/uL 247  221  195      Author: Loyce Dys, MD 03/31/2024 12:58 PM  For on call review [email or web address].

## 2024-03-31 NOTE — Progress Notes (Signed)
 Palliative Care Progress Note, Assessment & Plan   Patient Name: [name] [name]       Date: 03/31/2024 DOB: 1929-12-12  Age: 89 y.o. MRN#: [id number] Attending Physician: Loyce Dys, MD Primary Care Physician: [name], MD Admit Date: 03/14/2024  Subjective: Patient is sitting up in bed and working with nurse and nurse tech to have his dressings changed to bilateral lower extremities.  He is awake and alert.  He acknowledges my presence and is able to make his wishes known.  No family or friends present during my visit.  HPI: 89 y.o. male  with past medical history of severe PAD s/p multiple revascularizations (most recent-01/22/2024 for critical limb ischemia) on chronic anticoagulation-Eliquis, systolic CHF secondary to ischemic cardiomyopathy (EF 35 to 40% on 08/2023), CAD s/p CABG x 3 (1999), and HTN admitted on 03/14/2024 with nausea and vomiting with lower abdominal pain.  Patient has been diagnosed with SBO.   3/18, patient underwent ISIS of adhesion.  Volvulus with hemorrhagic bowel encountered at but no resection performed.   3/23, CT of abdomen and pelvis revealed retroperitoneal hematoma active extravasation. Patient given 2 units RBCs.   Evening of 3/23, patient became hypotensive but asymptomatic.  Patient transferred to ICU.    PMT was consulted to support patient and family with goals of care discussions.  Summary of counseling/coordination of care: Extensive chart review completed prior to meeting patient including labs, vital signs, imaging, progress notes, orders, and available advanced directive documents from current and previous encounters.   After reviewing the patient's chart and assessing the patient at bedside, I spoke with patient in regards to symptom management and  goals of care.   Symptoms assessed.  Patient endorses his ankles are sore.  He denies pain, throbbing sensation, or cramping.  No adjustment to [facility] needed at this time.  Gentle massage provided by nurse tech and patient endorsed relief.  I attempted to gauge patient's understanding of his current medical situation.  He shares that "the plan is the plan".  I highlighted that the plan remains for patient to discharge to a facility so that he can continue to get nursing care needed.  He shares "that sounds good".  I question his ability to fully comprehend his medical plan of care.  Plan remains for patient to discharge to long-term care facility once insurance/finances have been established.  TOC following closely for discharge planning.  PMT will continue to follow and support patient and family throughout his hospitalization.  Physical Exam Vitals reviewed.  Constitutional:      Comments: Thin, frail  HENT:     Head:     Comments: Temporal wasting    Mouth/Throat:     Mouth: Mucous membranes are moist.  Eyes:     Pupils: Pupils are equal, round, and reactive to light.  Pulmonary:     Effort: Pulmonary effort is normal.  Abdominal:     Palpations: Abdomen is soft.  Musculoskeletal:     Comments: Generalized weakness, MAETC  Neurological:     Mental Status: He is alert.     Comments: Oriented to self and place  Psychiatric:        Mood and Affect:  Mood normal.        Behavior: Behavior normal.             Total Time 25 minutes   Time spent includes: Detailed review of medical records (labs, imaging, vital signs), medically appropriate exam (mental status, respiratory, cardiac, skin), discussed with treatment team, counseling and educating patient, family and staff, documenting clinical information, medication management and coordination of care.  Samara Deist L. Bonita Quin, DNP, FNP-BC Palliative Medicine Team

## 2024-03-31 NOTE — TOC Progression Note (Signed)
 Transition of Care [facility]) - Progression Note    Patient Details  Name: [name] [name] MRN: [id number] Date of Birth: 1929/02/18  [facility] CM/SW Contact  Chapman Fitch, RN Phone Number: 03/31/2024, 3:57 PM  Clinical Narrative:     - Per [facility] they can accept patient with private pay with hospice with 2 months up - they have notified daughter [name], and [name] to discuss with her husband to determine if they can move forward financially - [place] with [facility] said they can consider private pay depending on patients financial information. Daughter [name] has left a message at [facility] to discuss - [name] states that she talked to [place] at [facility] to see if patient could return with DME and hospice services. I spoke with Maralyn Sago at [facility].  She requested clinical be faxed to [phone], and she will come to assess the patient in the morning        Expected Discharge Plan and Services                                               Social Determinants of Health (SDOH) Interventions SDOH Screenings   Food Insecurity: No Food Insecurity (03/15/2024)  Housing: Low Risk  (03/15/2024)  Transportation Needs: No Transportation Needs (03/15/2024)  Utilities: Not At Risk (03/15/2024)  Depression (PHQ2-9): Low Risk  (02/09/2024)  Financial Resource Strain: Low Risk  (09/22/2023)   Received from [facility]  Social Connections: Moderately Isolated (03/15/2024)  Tobacco Use: Low Risk  (03/15/2024)    Readmission Risk Interventions     No data to display

## 2024-03-31 NOTE — Progress Notes (Signed)
 Patient had a 5-7 beat run of V tach per tele. Patient asymptomatic. MD Djan made aware.

## 2024-03-31 NOTE — Progress Notes (Signed)
 Patient doing well on exam this morning. Alert to person and knows he is in [place] but did not know he was in hospital.  Low PO intake, but tolerating Stool recorded Abdomen soft, non-tender, some bruising around midline, staples removed and steristrips in place at superior edge, have fallen off inferior edge Continue diet, doing well from surgical standpoint

## 2024-03-31 NOTE — Consult Note (Signed)
 PHARMACY CONSULT NOTE - ELECTROLYTES  Pharmacy Consult for Electrolyte Monitoring and Replacement   Recent Labs: Height: 5\' 7"  (170.2 cm) Weight: 68 kg (149 lb 14.6 oz) IBW/kg (Calculated) : 66.1 Estimated Creatinine Clearance: 55.1 mL/min (by C-G formula based on SCr of 0.62 mg/dL).  Potassium (mmol/L)  Date Value  03/31/2024 4.0  02/04/2015 4.2   Magnesium (mg/dL)  Date Value  40/98/1191 2.1   Calcium (mg/dL)  Date Value  47/82/9562 7.8 (L)   Calcium, Total (mg/dL)  Date Value  13/07/6577 8.5   Albumin (g/dL)  Date Value  46/96/2952 2.2 (L)  02/04/2015 3.5   Phosphorus (mg/dL)  Date Value  84/13/2440 2.7   Sodium (mmol/L)  Date Value  03/31/2024 132 (L)  02/04/2015 141   Assessment  [name] [name] is a 89 y.o. male presenting with a small bowel obstruction. PMH significant for severe PAD, systolic CHF secondary to ischemic cardiomyopathy, EF 35 to 40% 08/2023, CAD s/p CABG times 03/16/1998, and HTN. Pharmacy has been consulted to monitor and replace electrolytes. Patient is status post exploratory lap with lysis of adhesions 03/15/24.  Volvulus with hemorrhagic bowel encountered, no resection performed. Retroperitoneal hematoma- Hx CABGx3-Currently off ASA/Plavix due to bleeding   Diet: Dysphagia 3 MIVF: N/A  Goal of Therapy: Electrolytes WNL  Plan:  --Persistent mild hyponatremia. Continue to monitor --Electrolytes have been stable overall and not requiring replacement. Will discontinue electrolyte consult at this time. Defer further ordering of labs and electrolyte replacement to primary team   Thank you for involving pharmacy in this patient's care.   Tressie Ellis 03/31/2024 7:28 AM

## 2024-03-31 NOTE — Progress Notes (Signed)
 Physical Therapy Treatment Patient Details Name: [name] [name] MRN: [id number] DOB: Sep 01, 1929 Today's Date: 03/31/2024   History of Present Illness Pt admitted for SBO s/p exploratory laparotomy. Left RP bleed with transfer to ICU. PMHx of critical limb ischemia s/p femoral endarterectomy, CHF, CAD s/p CABG, on chronic anticoagulation, immunotherapy for recurrent squamous cell carcinoma.    PT Comments  Pt received upright in bed agreeable to PT/OT co-treat to maximize functional mobility. Pt voices discomfort in current reclined position. Pleasant and happy to attempt to mobilize with therapy. Pt reliant on heavy two person assist for bed mobility, sitting balance, and standing efforts. Cognition greatly impacting session with pt hard to redirect despite max multimodal cuing for sitting balance and standing efforts and pt is very resistive and impulsive trying to perform activities in his own way despite education on improved safety with PT/OT efforts.MaxA+2 standing performed but pt with heavy posterior bias and not keeping feet on the ground leading to unsuccessful standing attempt. Noted BM on chuck pad during attempt thus maxA+2 to return to supine and minA+1 for rolling R/L for pericare and linens change. Per OT pt with sacral skin redness with NT notified. Educated NT to attempt quarter lying for skin integrity. Pt left upright in bed with all needs in reach. D/c recs remain appropriate.    If plan is discharge home, recommend the following: Two people to help with walking and/or transfers;A lot of help with bathing/dressing/bathroom;Help with stairs or ramp for entrance;Supervision due to cognitive status   Can travel by private vehicle     No  Equipment Recommendations  None recommended by PT    Recommendations for Other Services       Precautions / Restrictions Precautions Precautions: Fall Recall of Precautions/Restrictions: Impaired Restrictions Weight Bearing Restrictions  Per Provider Order: No     Mobility  Bed Mobility Overal bed mobility: Needs Assistance Bed Mobility: Supine to Sit, Rolling, Sit to Supine Rolling: Used rails, Min assist   Supine to sit: Max assist, +2 for safety/equipment Sit to supine: Max assist, +2 for safety/equipment   General bed mobility comments: Bed soiled on arrival to room, pt unaware. Bed level pericare (RN notified) Patient Response: Cooperative  Transfers Overall transfer level: Needs assistance Equipment used: Rolling walker (2 wheels) Transfers: Sit to/from Stand Sit to Stand: Max assist, +2 safety/equipment           General transfer comment: Pt attempted STS x2  from EOB, unable to clear bed due to pt consistent resisting and pulling during standing attempts.    Ambulation/Gait                   Stairs             Wheelchair Mobility     Tilt Bed Tilt Bed Patient Response: Cooperative  Modified Rankin (Stroke Patients Only)       Balance Overall balance assessment: Needs assistance Sitting-balance support: Bilateral upper extremity supported Sitting balance-Leahy Scale: Poor Sitting balance - Comments: minA progressing to CGA Postural control: Posterior lean Standing balance support: Bilateral upper extremity supported, During functional activity Standing balance-Leahy Scale: Zero Standing balance comment: unable to stand on this date                            Communication Communication Communication: Impaired Factors Affecting Communication: Hearing impaired  Cognition Arousal: Alert Behavior During Therapy: WFL for tasks assessed/performed   PT - Cognitive impairments:  History of cognitive impairments                       PT - Cognition Comments: Remains confused about meals. Knows he is in a hospital today. Following commands: Impaired Following commands impaired: Follows one step commands with increased time    Cueing Cueing Techniques:  Verbal cues, Tactile cues  Exercises      General Comments General comments (skin integrity, edema, etc.): open pressure injury in scaral area, noted during bed level pericare (RN notified)      Pertinent Vitals/Pain Pain Assessment Pain Assessment: Faces Faces Pain Scale: Hurts little more Pain Location: L heel Pain Descriptors / Indicators: Discomfort, Grimacing Pain Intervention(s): Limited activity within patient's tolerance, Monitored during session, Repositioned    Home Living                          Prior Function            PT Goals (current goals can now be found in the care plan section) Acute Rehab PT Goals Patient Stated Goal: to walk again PT Goal Formulation: With patient Time For Goal Achievement: 04/13/24 Potential to Achieve Goals: Fair Progress towards PT goals: Not progressing toward goals - comment (unsuccessful with standing and transfer attempts)    Frequency    Min 1X/week      PT Plan      Co-evaluation PT/OT/SLP Co-Evaluation/Treatment: Yes Reason for Co-Treatment: For patient/therapist safety;To address functional/ADL transfers PT goals addressed during session: Mobility/safety with mobility OT goals addressed during session: ADL's and self-care      AM-PAC PT "6 Clicks" Mobility   Outcome Measure  Help needed turning from your back to your side while in a flat bed without using bedrails?: A Lot Help needed moving from lying on your back to sitting on the side of a flat bed without using bedrails?: Total Help needed moving to and from a bed to a chair (including a wheelchair)?: Total Help needed standing up from a chair using your arms (e.g., wheelchair or bedside chair)?: Total Help needed to walk in hospital room?: Total Help needed climbing 3-5 steps with a railing? : Total 6 Click Score: 7    End of Session Equipment Utilized During Treatment: Gait belt;Oxygen Activity Tolerance: Patient limited by fatigue;Patient  tolerated treatment well Patient left: in bed;with call bell/phone within reach Nurse Communication: Mobility status PT Visit Diagnosis: Unsteadiness on feet (R26.81);Muscle weakness (generalized) (M62.81);History of falling (Z91.81);Difficulty in walking, not elsewhere classified (R26.2)     Time: 4098-1191 PT Time Calculation (min) (ACUTE ONLY): 28 min  Charges:    $Therapeutic Activity: 8-22 mins PT General Charges $$ ACUTE PT VISIT: 1 Visit                     Delphia Grates. Fairly IV, PT, DPT Physical Therapist- [place]  [facility]  03/31/2024, 2:56 PM

## 2024-04-01 ENCOUNTER — Encounter: Payer: Self-pay | Admitting: General Surgery

## 2024-04-01 DIAGNOSIS — R112 Nausea with vomiting, unspecified: Secondary | ICD-10-CM | POA: Diagnosis not present

## 2024-04-01 DIAGNOSIS — K56609 Unspecified intestinal obstruction, unspecified as to partial versus complete obstruction: Secondary | ICD-10-CM | POA: Diagnosis not present

## 2024-04-01 DIAGNOSIS — K559 Vascular disorder of intestine, unspecified: Secondary | ICD-10-CM | POA: Diagnosis not present

## 2024-04-01 DIAGNOSIS — Z515 Encounter for palliative care: Secondary | ICD-10-CM | POA: Diagnosis not present

## 2024-04-01 NOTE — NC FL2 (Signed)
 [name] [name] LEVEL OF CARE FORM     IDENTIFICATION  Patient Name: [name] [name] Birthdate: Dec 17, 1929 Sex: male Admission Date (Current Location): 03/14/2024  [facility] and [place] Number:  Chiropodist and Address:  [facility], [address] [place], [place] [address]      Provider Number: [id number]  Attending Physician Name and Address:  Loyce Dys, MD  Relative Name and Phone Number:       Current Level of Care: Hospital Recommended Level of Care: Assisted Living Facility (With hospice) Prior Approval Number:    Date Approved/Denied:   PASRR Number: [id number]  Discharge Plan: SNF (with hospice)    Current Diagnoses: Patient Active Problem List   Diagnosis Date Noted   Nontraumatic retroperitoneal hematoma 03/21/2024   ABLA (acute blood loss anemia) 03/21/2024   Hemorrhagic shock (HCC) 03/21/2024   Protein-calorie malnutrition, severe 03/17/2024   Small bowel obstruction (HCC) 03/15/2024   PVD (peripheral vascular disease) (HCC) 03/15/2024   Ischemic cardiomyopathy 03/15/2024   Chronic anticoagulation 03/15/2024   Lactic acidosis 03/15/2024   History of revascularization procedure of lower extremity 03/15/2024   Recurrent squamous cell carcinoma in situ (SCCIS) of skin of cheek 02/09/2024   Symptomatic anemia 02/09/2024   Critical limb ischemia of left lower extremity (HCC) 01/21/2024   Pressure injury of skin of dorsum of left foot 09/27/2023   Fall 09/26/2023   HFrEF (heart failure with reduced ejection fraction) (HCC) 09/26/2023   Syncope 09/24/2023   Chronic heart failure with reduced ejection fraction (HFrEF, <= 40%) (HCC) 09/04/2021   Atherosclerosis of native arteries of the extremities with ulceration (HCC) 09/04/2021   Atherosclerosis of artery of extremity with rest pain (HCC) 08/30/2021   Atherosclerosis of native arteries of extremity with intermittent claudication (HCC) 02/18/2021   Cataract  cortical, senile 02/11/2021   Cor pulmonale, acute (HCC) 05/02/2020   Type 2 diabetes mellitus with peripheral angiopathy (HCC) 11/10/2018   Medicare annual wellness visit, initial 05/05/2017   Varicose veins of lower extremities with ulcer (HCC) 04/23/2017   Chronic venous insufficiency 04/23/2017   Leg pain 04/23/2017   Lymphedema 04/23/2017   A-fib (HCC) 04/23/2017   Hyperlipidemia 04/23/2017   Acquired hypothyroidism 11/03/2016   B12 deficiency 10/27/2014   CAD with history of CABG x3 (coronary artery disease) 04/25/2014   GERD (gastroesophageal reflux disease) 04/25/2014   HTN (hypertension), benign 04/25/2014   S/P CABG x 3 02/26/1993    Orientation RESPIRATION BLADDER Height & Weight     Self, Place  O2 (Nasal Cannula 2 L) Incontinent, External catheter Weight: 155 lb 10.3 oz (70.6 kg) Height:  5\' 7"  (170.2 cm)  BEHAVIORAL SYMPTOMS/MOOD NEUROLOGICAL BOWEL NUTRITION STATUS   (None)  (None) Incontinent Diet (Please chop meats and add Gravies. May have Baked/Sweet potatoes per Speech ok. No straws.)  AMBULATORY STATUS COMMUNICATION OF NEEDS Skin   Extensive Assist Verbally Skin abrasions, Bruising, Surgical wounds, Other (Comment) (Erythema/redness. Unstageable pressure injury on left heel (gauze daily), Incision on abdomen (no dressing), and skin tear on right arm (gauze every other day).)                       Personal Care Assistance Level of Assistance  Bathing, Feeding, Dressing Bathing Assistance: Maximum assistance Feeding assistance: Limited assistance Dressing Assistance: Maximum assistance     Functional Limitations Info  Sight, Speech, Hearing Sight Info: Adequate Hearing Info: Adequate Speech Info: Adequate    SPECIAL CARE FACTORS FREQUENCY  PT (By licensed PT), OT (By licensed OT)                    Contractures Contractures Info: Not present    Additional Factors Info  Code Status, Allergies Code Status Info: DNR Allergies Info:  Cephalexin, Spironolactone, Codeine, Doxycyline           Current Medications (04/01/2024):  This is the current hospital active medication list Current Facility-Administered Medications  Medication Dose Route Frequency Provider Last Rate Last Admin   acetaminophen (TYLENOL) tablet 650 mg  650 mg Oral Q6H PRN Loyce Dys, MD   650 mg at 03/31/24 2202   ascorbic acid (VITAMIN C) tablet 500 mg  500 mg Oral BID Esaw Grandchild A, DO   500 mg at 04/01/24 0944   docusate sodium (COLACE) capsule 100 mg  100 mg Oral Daily Kandis Cocking, MD   100 mg at 04/01/24 0944   feeding supplement (ENSURE ENLIVE / ENSURE PLUS) liquid 237 mL  237 mL Oral TID BM Esaw Grandchild A, DO   237 mL at 04/01/24 0950   leptospermum manuka honey (MEDIHONEY) paste 1 Application  1 Application Topical Daily Kathrynn Running, MD   1 Application at 04/01/24 1610   levothyroxine (SYNTHROID) tablet 112 mcg  112 mcg Oral Q0600 Kandis Cocking, MD   112 mcg at 04/01/24 0540   morphine (PF) 2 MG/ML injection 2 mg  2 mg Intravenous Q2H PRN Kandis Cocking, MD   2 mg at 03/23/24 0046   multivitamin with minerals tablet 1 tablet  1 tablet Oral Daily Esaw Grandchild A, DO   1 tablet at 04/01/24 0944   ondansetron (ZOFRAN) injection 4 mg  4 mg Intravenous Q4H PRN Kandis Cocking, MD   4 mg at 03/21/24 0540   pantoprazole (PROTONIX) EC tablet 40 mg  40 mg Oral Daily Mila Merry A, RPH   40 mg at 04/01/24 0944   polyethylene glycol (MIRALAX / GLYCOLAX) packet 17 g  17 g Oral Daily PRN Kandis Cocking, MD   17 g at 03/29/24 1003   senna (SENOKOT) tablet 8.6 mg  1 tablet Oral QHS Kandis Cocking, MD   8.6 mg at 03/31/24 2200   simvastatin (ZOCOR) tablet 40 mg  40 mg Oral QPM Kathrynn Running, MD   40 mg at 03/31/24 1646   thiamine (VITAMIN B1) tablet 100 mg  100 mg Oral Daily Mila Merry A, RPH   100 mg at 04/01/24 9604     Discharge Medications: Please see discharge summary for a list of discharge  medications.  Relevant Imaging Results:  Relevant Lab Results:   Additional Information SS#: SS#: [id number]. Was living at [facility] prior to admission but they cannot manage the level of assistance he needs.  Laylee Schooley E Rosielee Corporan, LCSW

## 2024-04-01 NOTE — Progress Notes (Signed)
 Occupational Therapy Treatment Patient Details Name: [name] [name] MRN: [id number] DOB: 18-Oct-1929 Today's Date: 04/01/2024   History of present illness Pt admitted for SBO s/p exploratory laparotomy. Left RP bleed with transfer to ICU. PMHx of critical limb ischemia s/p femoral endarterectomy, CHF, CAD s/p CABG, on chronic anticoagulation, immunotherapy for recurrent squamous cell carcinoma.   OT comments  Chart reviewed to date, pt greeted semi supine in bed, agreeable to OT tx session targeting improving functional activity tolerance to improve ADL performance. Supine>sit performed with MAX A< lateral scoot to chair with MAX A +2, grooming with SET UP. BLE in prevlon boots post session in chair. Pt is left in chair, all needs met. Messaged MD/Nurse re: ?need for Wbing status in chart/offloading shoe per WOC notes on 3/19. OT will follow acutely.       If plan is discharge home, recommend the following:  A lot of help with bathing/dressing/bathroom;Help with stairs or ramp for entrance;Two people to help with walking and/or transfers   Equipment Recommendations  Other (comment) (defer)    Recommendations for Other Services      Precautions / Restrictions Precautions Precautions: Fall Recall of Precautions/Restrictions: Impaired Restrictions Weight Bearing Restrictions Per Provider Order: No       Mobility Bed Mobility Overal bed mobility: Needs Assistance Bed Mobility: Supine to Sit     Supine to sit: Max assist, HOB elevated          Transfers Overall transfer level: Needs assistance   Transfers: Bed to chair/wheelchair/BSC            Lateral/Scoot Transfers: Max assist, +2 physical assistance       Balance Overall balance assessment: Needs assistance Sitting-balance support: Bilateral upper extremity supported Sitting balance-Leahy Scale: Fair                                     ADL either performed or assessed with clinical judgement    ADL Overall ADL's : Needs assistance/impaired     Grooming: Wash/dry face;Supervision/safety               Lower Body Dressing: Maximal assistance;Total assistance Lower Body Dressing Details (indicate cue type and reason): donn/doff prevlon boots Toilet Transfer: Maximal assistance Toilet Transfer Details (indicate cue type and reason): simulated to recliner via lateral scoot MAX A +2                Extremity/Trunk Assessment Upper Extremity Assessment Upper Extremity Assessment: Generalized weakness   Lower Extremity Assessment Lower Extremity Assessment: Generalized weakness        Vision Patient Visual Report: No change from baseline     Perception     Praxis     Communication Communication Communication: Impaired Factors Affecting Communication: Hearing impaired   Cognition Arousal: Alert Behavior During Therapy: WFL for tasks assessed/performed Cognition: Cognition impaired   Orientation impairments: Situation Awareness: Intellectual awareness impaired Memory impairment (select all impairments): Short-term memory Attention impairment (select first level of impairment): Selective attention Executive functioning impairment (select all impairments): Problem solving                   Following commands: Impaired Following commands impaired: Follows one step commands with increased time      Cueing   Cueing Techniques: Verbal cues, Tactile cues  Exercises Other Exercises Other Exercises: edu re: role of OT, role of rehab, importance of progressing mobility  Shoulder Instructions       General Comments      Pertinent Vitals/ Pain       Pain Assessment Pain Assessment: Faces Faces Pain Scale: Hurts little more Pain Location: BLE Pain Descriptors / Indicators: Discomfort, Grimacing Pain Intervention(s): Monitored during session, Limited activity within patient's tolerance, Repositioned  Home Living                                           Prior Functioning/Environment              Frequency  Min 2X/week        Progress Toward Goals  OT Goals(current goals can now be found in the care plan section)  Progress towards OT goals: Progressing toward goals  Acute Rehab OT Goals Time For Goal Achievement: 04/05/24  Plan      Co-evaluation                 AM-PAC OT "6 Clicks" Daily Activity     Outcome Measure   Help from another person eating meals?: A Little Help from another person taking care of personal grooming?: A Little Help from another person toileting, which includes using toliet, bedpan, or urinal?: A Lot Help from another person bathing (including washing, rinsing, drying)?: A Lot Help from another person to put on and taking off regular upper body clothing?: A Little Help from another person to put on and taking off regular lower body clothing?: Total 6 Click Score: 14    End of Session Equipment Utilized During Treatment: Oxygen  OT Visit Diagnosis: Other abnormalities of gait and mobility (R26.89);Unsteadiness on feet (R26.81);Muscle weakness (generalized) (M62.81)   Activity Tolerance Patient tolerated treatment well   Patient Left in chair;with call bell/phone within reach;with chair alarm set   Nurse Communication Mobility status        Time: 1112-1140 OT Time Calculation (min): 28 min  Charges: OT General Charges $OT Visit: 1 Visit OT Treatments $Self Care/Home Management : 8-22 mins $Therapeutic Activity: 8-22 mins Oleta Mouse, OTD OTR/L  04/01/24, 12:53 PM

## 2024-04-01 NOTE — Progress Notes (Signed)
 Progress Note   Patient: [name] [name] UJW:[id number] DOB: [birth year] DOA: 03/14/2024     17 DOS: the patient was seen and examined on 04/01/2024        Brief Narrative:    From admission h and p   [name] [name] is a 89 y.o. male with medical history significant for Severe PAD s/p multiple revascularization most recently 01/22/2024 for critical limb ischemia, and on chronic anticoagulation with Eliquis, systolic CHF secondary to ischemic cardiomyopathy, EF 35 to 40% 08/2023, CAD s/p CABG times 03/16/1998, HTN, who is being admitted with a small bowel obstruction, after presenting with nausea vomiting lower abdominal pain that started around noon on 3/17.  He had no diarrhea, no blood in the stool, no fever or chills.    Assessment & Plan:     # Small bowel obstruction status post surgery Patient is status post exploratory lap with lysis of adhesions 03/15/24.  Volvulus with hemorrhagic bowel encountered, no resection performed.  Continue current diet Patient currently cleared surgically for discharge Continue as needed pain medication Continue wound care   # Delirium-improved Continue delirium precaution   # Acute blood loss anemia # Hemorrhagic shock # Retroperitoneal hematoma LEFT Status post 3 units of blood transfusion Monitor CBC closely   # SCC left neck Continue wound care Outpatient follow-up with oncology   # Ulcer left heel Chronic, followed by wound care as outpt, daughter says slowly improving Continue wound care   Hyponatremia Monitor sodium level closely   # CAD Hx cabg x3, stable - no chest pain Continue statin therapy Currently off ASA/Plavix due to bleeding   # Hypothyroid Continue Synthroid   # Chronic HFrEF Currently off Lasix metoprolol given hypotension   # Skin tear Right forearm, happened 3/21  Continue wound care   # A-fib HR's up in setting of hemorrhagic shock Not on anticoagulation on account of retroperitoneal bleed     #  PAD S/p multiple vascular procedures most recently January of this year --Hold aspirin and anticoagulation for now Continue statin therapy   # T2DM Well controlled. Last A1c 5.1    # R flank ecchymosis  Continue to monitor closely   # Debility Resides at [facility]. PT advises snf Awaiting snf placement Palliative care on board and we appreciate input   #Goals of care --Code status DNR/DNI --Palliative Care consulted Family is agreeable with discharge with hospice     DVT prophylaxis: none - due to retroperitoneal hematoma and hemorrhagic shock   Code Status: dnr/dni   Family Communication: Discussed with daughter and son-in-law at bedside   Consultants:  General Surgery Palliative Care   Procedures: Ex lap     Subjective: Patient seen this morning with family at bedside [facility] still working on placement .  Denied nausea or vomiting Still on 2 L of oxygen   Physical Exam    General exam: awake, drowsy, no acute distress, frail HEENT: moist mucus membranes, hearing grossly normal  Respiratory system: CTAB, no wheezes, rales or rhonchi, normal respiratory effort. Cardiovascular system: normal S1/S2  RRR, no lower extremity edema.   Gastrointestinal system: midline surgical incision appears well without signs of bleeding or surrounding erythema swelling or other signs of infection Central nervous system: more alert & conversational, A&Ox2+ no gross focal neurologic deficits, normal speech Extremities: LUE edema, offloading boots on feet, no BLE edema Skin: color is improved, minimal ecchymosis of LEFT flank, scattered ecchymosis of extremities   Disposition: TOC working on facility  that is able to accept patient on hospice   Data Reviewed:    Vitals:   03/31/24 1939 04/01/24 0405 04/01/24 0500 04/01/24 0811  BP: 120/67 (!) 101/49  (!) 109/57  Pulse: 94 81  80  Resp: 20 20  16   Temp: 98.1 F (36.7 C) 97.8 F (36.6 C)  97.7 F (36.5 C)  TempSrc: Oral  Oral    SpO2: 100% 98%  99%  Weight:   70.6 kg   Height:          Latest Ref Rng & Units 03/30/2024    5:19 AM 03/26/2024    4:56 AM 03/25/2024    5:00 AM  CBC  WBC 4.0 - 10.5 K/uL 9.3  12.8  11.8   Hemoglobin 13.0 - 17.0 g/dL 8.6  7.9  7.5   Hematocrit 39.0 - 52.0 % 25.6  24.4  22.2   Platelets 150 - 400 K/uL 247  221  195        Latest Ref Rng & Units 03/31/2024    5:05 AM 03/30/2024    5:19 AM 03/29/2024    4:29 AM  BMP  Glucose 70 - 99 mg/dL 86  86  89   BUN 8 - 23 mg/dL 21  17  19    Creatinine 0.61 - 1.24 mg/dL 1.61  0.96  0.45   Sodium 135 - 145 mmol/L 132  128  131   Potassium 3.5 - 5.1 mmol/L 4.0  3.9  4.0   Chloride 98 - 111 mmol/L 100  97  98   CO2 22 - 32 mmol/L 27  25  25    Calcium 8.9 - 10.3 mg/dL 7.8  7.6  7.7      Author: Loyce Dys, MD 04/01/2024 2:43 PM  For on call review [email or web address].

## 2024-04-01 NOTE — TOC Progression Note (Addendum)
 Transition of Care [facility]) - Progression Note    Patient Details  Name: [name] [name] MRN: [id number] Date of Birth: 12/29/1929  [facility] CM/SW Contact  Liliana Cline, LCSW Phone Number: 04/01/2024, 9:45 AM  Clinical Narrative:    CSW called Sarah at [facility], she states she is coming to assess patient around 10:30 am and agreed to update this CSW on how it goes.  12:14- Clinicals sent to Sarah by encrypted email per her request.  3:45- Sarah with [facility] states she can take patient on Monday. She states she has already updated the daughter. Maralyn Sago states the [facility] needs to say ALF and they will need hospital bed and oxygen provided by hospice before he arrives on Monday. Updated Diannia Ruder with Marcell Anger and MD.        Expected Discharge Plan and Services                                               Social Determinants of Health (SDOH) Interventions SDOH Screenings   Food Insecurity: No Food Insecurity (03/15/2024)  Housing: Low Risk  (03/15/2024)  Transportation Needs: No Transportation Needs (03/15/2024)  Utilities: Not At Risk (03/15/2024)  Depression (PHQ2-9): Low Risk  (02/09/2024)  Financial Resource Strain: Low Risk  (09/22/2023)   Received from [facility]  Social Connections: Moderately Isolated (03/15/2024)  Tobacco Use: Low Risk  (03/15/2024)    Readmission Risk Interventions     No data to display

## 2024-04-01 NOTE — Progress Notes (Signed)
 Sports coach Note  Received notification from [facility], SW/TOC that patient will discharge to [facility] on Monday.  DME needed prior to discharge:  hospital bed, over bed table and Oxygen 1-2L/[place].  DME ordered.  Hospital liaison team will continue to follow through final disposition.  Thank you for allowing participation in this patient's care.  Norris Cross, RN Nurse Liaison [phone]

## 2024-04-01 NOTE — Progress Notes (Addendum)
 Palliative Care Progress Note, Assessment & Plan   Patient Name: [name] [name]       Date: 04/01/2024 DOB: 19-May-1929  Age: 89 y.o. MRN#: [id number] Attending Physician: Loyce Dys, MD Primary Care Physician: [name], MD Admit Date: 03/14/2024  Subjective: Patient is sitting up in bed eating his breakfast.  He is awake and alert.  He acknowledges my presence and is able to make his wishes known.  No family or friends present during my visit.  HPI: 89 y.o. male  with past medical history of severe PAD s/p multiple revascularizations (most recent-01/22/2024 for critical limb ischemia) on chronic anticoagulation-Eliquis, systolic CHF secondary to ischemic cardiomyopathy (EF 35 to 40% on 08/2023), CAD s/p CABG x 3 (1999), and HTN admitted on 03/14/2024 with nausea and vomiting with lower abdominal pain.  Patient has been diagnosed with SBO.   3/18, patient underwent ISIS of adhesion.  Volvulus with hemorrhagic bowel encountered at but no resection performed.   3/23, CT of abdomen and pelvis revealed retroperitoneal hematoma active extravasation. Patient given 2 units RBCs.   Evening of 3/23, patient became hypotensive but asymptomatic.  Patient transferred to ICU, stabilized, and sent to [facility] floor.    PMT was consulted to support patient and family with goals of care discussions.  Summary of counseling/coordination of care: Extensive chart review completed prior to meeting patient including labs, vital signs, imaging, progress notes, orders, and available advanced directive documents from current and previous encounters.   After reviewing the patient's chart and assessing the patient at bedside, I spoke with patient in regards to symptom management and goals of care.   Symptoms assessed.   Patient denies any pain or discomfort at this time.  He shares he has some soreness in his legs but after moving around it dissipates.  No adjustment to [facility] needed at this time.  When asked his understanding of his current medical situation, patient shares that he would like to share his favorite him with me.  He saying his favorite him.  I attempted to redirect patient to discuss his current medical situation.  He shares that he knows he is on the earth to witness for the Lord.  He is resistant to discussing plan of care or goals at this time.  As per chart review, patient is to be evaluated for return to [facility] today.  In light of patient returning to a another medical facility, and reduced the concept of a MOST form.  Patient unable to participate in discussion of MOST form today.  Copy left at bedside for family to review.  No adjustment to plan of care at this time.  PMT will continue to follow and support patient throughout his hospitalization.  I am off service until Tuesday.  I plan to follow-up with patient at that time.  Should palliative needs arise before then, please utilize Amion for on-call palliative providers.  Physical Exam Vitals reviewed.  Constitutional:      Comments: Thin, frail  HENT:     Head:     Comments: Temporal wasting    Mouth/Throat:     Mouth: Mucous membranes are moist.  Eyes:     Pupils: Pupils are  equal, round, and reactive to light.  Pulmonary:     Effort: Pulmonary effort is normal.  Abdominal:     Palpations: Abdomen is soft.  Musculoskeletal:     Comments: MAETC, generalized weakness  Skin:    General: Skin is warm and dry.  Neurological:     Mental Status: He is alert.     Comments: Oriented to self and place  Psychiatric:        Mood and Affect: Mood normal.        Behavior: Behavior normal.        Thought Content: Thought content normal.        Judgment: Judgment normal.             Total Time 25 minutes   Time spent  includes: Detailed review of medical records (labs, imaging, vital signs), medically appropriate exam (mental status, respiratory, cardiac, skin), discussed with treatment team, counseling and educating patient, family and staff, documenting clinical information, medication management and coordination of care.  Samara Deist L. Bonita Quin, DNP, FNP-BC Palliative Medicine Team

## 2024-04-01 NOTE — Progress Notes (Signed)
 SLP F/U Note  Patient Details Name: [name] [name] MRN: [id number] DOB: 02-08-1929   Cancelled treatment:       Reason Eval/Treat Not Completed:  (chart reviewed; consulted NSG re: pt's toleration of diet.)  NSG reported good toleration of po diet w/ no overt s/s of aspiration noted per NSG/chart notes. ANS appears at his Baseline w/ 1L of O2 support noted at night; afebrile and WBC WNL.  No further acute skilled ST services indicated. Education completed. Precautions in chart.     Jerilynn Som, MS, CCC-SLP Speech Language Pathologist Rehab Services; [facility] [phone] (ascom) [name] [name] 04/01/2024, 2:27 PM

## 2024-04-02 DIAGNOSIS — K56609 Unspecified intestinal obstruction, unspecified as to partial versus complete obstruction: Secondary | ICD-10-CM | POA: Diagnosis not present

## 2024-04-02 NOTE — Plan of Care (Signed)
 Received report, sleeping when check, repositioned. Alert to person, place. Denies pain. Tolerating fluids/ensure. Color pale, skin warm and dry. Assessment completed. Continue to monitor, reposition q2hrs. Asking for urinal, needs assistance to place urinal due to penile swelling. Denies penile pain. BM X 2. 2300 Repositioned, tolerated HS meds. Rouses easily. 0215 Asking for urinal, given. States he needs to use the BR, offered bedpan. 0600 Slept most of night.

## 2024-04-02 NOTE — Progress Notes (Signed)
 Sports coach Note  Received notification from [facility], SW/TOC that patient will discharge to [facility] on Monday.  DME needed prior to discharge:  hospital bed, over bed table and Oxygen 1-2L/[place].  DME ordered.  Hospital liaison team will continue to follow through final disposition.  Thank you for allowing participation in this patient's care.  Norris Cross, RN Nurse Liaison [phone]

## 2024-04-02 NOTE — Progress Notes (Signed)
 Progress Note   Patient: [name] [name] WUJ:[id number] DOB: March 02, 1929 DOA: 03/14/2024     18 DOS: the patient was seen and examined on 04/02/2024    Brief Narrative:    From admission h and p   LEVERNE AMRHEIN is a 89 y.o. male with medical history significant for Severe PAD s/p multiple revascularization most recently 01/22/2024 for critical limb ischemia, and on chronic anticoagulation with Eliquis, systolic CHF secondary to ischemic cardiomyopathy, EF 35 to 40% 08/2023, CAD s/p CABG times 03/16/1998, HTN, who is being admitted with a small bowel obstruction, after presenting with nausea vomiting lower abdominal pain that started around noon on 3/17.  He had no diarrhea, no blood in the stool, no fever or chills.    Assessment & Plan:     # Small bowel obstruction status post surgery Patient is status post exploratory lap with lysis of adhesions 03/15/24.  Volvulus with hemorrhagic bowel encountered, no resection performed.  Continue current diet Patient currently cleared surgically for discharge Continue as needed pain medication Continue wound care   # Delirium-improved Continue delirium precaution   # Acute blood loss anemia # Hemorrhagic shock # Retroperitoneal hematoma LEFT Status post 3 units of blood transfusion Monitor CBC closely   # SCC left neck Continue wound care Outpatient follow-up with oncology   # Ulcer left heel Chronic, followed by wound care as outpt, daughter says slowly improving Continue wound care   Hyponatremia Monitor sodium level closely   # CAD Hx cabg x3, stable - no chest pain Continue statin therapy Currently off ASA/Plavix due to bleeding   # Hypothyroid Continue Synthroid   # Chronic HFrEF Currently off Lasix metoprolol given hypotension   # Skin tear Right forearm, happened 3/21  Continue wound care   # A-fib Not on anticoagulation on account of retroperitoneal bleed     # PAD S/p multiple vascular procedures most  recently January of this year --Hold aspirin and anticoagulation for now Continue statin therapy   # T2DM Well controlled. Last A1c 5.1    # R flank ecchymosis  Continue to monitor closely   # Debility Resides at [facility]. PT advises snf Awaiting snf placement Palliative care on board and we appreciate input   #Goals of care --Code status DNR/DNI --Palliative Care consulted Family is agreeable with discharge with hospice     DVT prophylaxis: none - due to retroperitoneal hematoma    Code Status: dnr/dni   Family Communication: Discussed with daughter and son-in-law at bedside   Consultants:  General Surgery Palliative Care   Procedures: Ex lap     Subjective: Patient seen and examined at bedside this morning Denies nausea vomiting abdominal pain or chest pain According to [facility] patient will likely go to a facility on Monday on hospice   Physical Exam    General exam: awake, drowsy, no acute distress, frail HEENT: moist mucus membranes, hearing grossly normal  Respiratory system: CTAB, no wheezes, rales or rhonchi, normal respiratory effort. Cardiovascular system: normal S1/S2  RRR, no lower extremity edema.   Gastrointestinal system: midline surgical incision appears well without signs of bleeding or surrounding erythema swelling or other signs of infection Central nervous system: more alert & conversational, A&Ox2+ no gross focal neurologic deficits, normal speech Extremities: LUE edema, offloading boots on feet, no BLE edema Skin: color is improved, minimal ecchymosis of LEFT flank, scattered ecchymosis of extremities   Disposition: TOC working on facility that is able to accept patient on hospice.  According to [facility] manager patient will likely be accepted at [facility] Monday with hospice   Data Reviewed:      Vitals:   04/02/24 0327 04/02/24 0354 04/02/24 0739 04/02/24 1501  BP: 110/64  111/60 (!) 108/50  Pulse: 78  89 88  Resp: 16  18 18   Temp:  98 F (36.7 C)  98.1 F (36.7 C) 98.6 F (37 C)  TempSrc: Oral     SpO2: 100%  97% 99%  Weight:  68.5 kg    Height:          Latest Ref Rng & Units 03/30/2024    5:19 AM 03/26/2024    4:56 AM 03/25/2024    5:00 AM  CBC  WBC 4.0 - 10.5 K/uL 9.3  12.8  11.8   Hemoglobin 13.0 - 17.0 g/dL 8.6  7.9  7.5   Hematocrit 39.0 - 52.0 % 25.6  24.4  22.2   Platelets 150 - 400 K/uL 247  221  195        Latest Ref Rng & Units 03/31/2024    5:05 AM 03/30/2024    5:19 AM 03/29/2024    4:29 AM  BMP  Glucose 70 - 99 mg/dL 86  86  89   BUN 8 - 23 mg/dL 21  17  19    Creatinine 0.61 - 1.24 mg/dL 1.61  0.96  0.45   Sodium 135 - 145 mmol/L 132  128  131   Potassium 3.5 - 5.1 mmol/L 4.0  3.9  4.0   Chloride 98 - 111 mmol/L 100  97  98   CO2 22 - 32 mmol/L 27  25  25    Calcium 8.9 - 10.3 mg/dL 7.8  7.6  7.7      Author: Loyce Dys, MD 04/02/2024 3:58 PM  For on call review [email or web address].

## 2024-04-03 DIAGNOSIS — K56609 Unspecified intestinal obstruction, unspecified as to partial versus complete obstruction: Secondary | ICD-10-CM | POA: Diagnosis not present

## 2024-04-03 LAB — CBC WITH DIFFERENTIAL/PLATELET
Abs Immature Granulocytes: 0.08 10*3/uL — ABNORMAL HIGH (ref 0.00–0.07)
Basophils Absolute: 0.1 10*3/uL (ref 0.0–0.1)
Basophils Relative: 1 %
Eosinophils Absolute: 0.2 10*3/uL (ref 0.0–0.5)
Eosinophils Relative: 3 %
HCT: 24.6 % — ABNORMAL LOW (ref 39.0–52.0)
Hemoglobin: 8.1 g/dL — ABNORMAL LOW (ref 13.0–17.0)
Immature Granulocytes: 1 %
Lymphocytes Relative: 11 %
Lymphs Abs: 0.9 10*3/uL (ref 0.7–4.0)
MCH: 30.5 pg (ref 26.0–34.0)
MCHC: 32.9 g/dL (ref 30.0–36.0)
MCV: 92.5 fL (ref 80.0–100.0)
Monocytes Absolute: 1.1 10*3/uL — ABNORMAL HIGH (ref 0.1–1.0)
Monocytes Relative: 14 %
Neutro Abs: 5.7 10*3/uL (ref 1.7–7.7)
Neutrophils Relative %: 70 %
Platelets: 296 10*3/uL (ref 150–400)
RBC: 2.66 MIL/uL — ABNORMAL LOW (ref 4.22–5.81)
RDW: 20.1 % — ABNORMAL HIGH (ref 11.5–15.5)
WBC: 8.1 10*3/uL (ref 4.0–10.5)
nRBC: 0 % (ref 0.0–0.2)

## 2024-04-03 LAB — BASIC METABOLIC PANEL WITH GFR
Anion gap: 6 (ref 5–15)
BUN: 19 mg/dL (ref 8–23)
CO2: 25 mmol/L (ref 22–32)
Calcium: 7.8 mg/dL — ABNORMAL LOW (ref 8.9–10.3)
Chloride: 100 mmol/L (ref 98–111)
Creatinine, Ser: 0.63 mg/dL (ref 0.61–1.24)
GFR, Estimated: >60 mL/min (ref >60)
Glucose, Bld: 90 mg/dL (ref 70–99)
Potassium: 3.8 mmol/L (ref 3.5–5.1)
Sodium: 131 mmol/L — ABNORMAL LOW (ref 135–145)

## 2024-04-03 NOTE — Progress Notes (Signed)
 [facility] Liaison Note   Follow up on new hospice referral post discharge from [facility].  Plan is for patient to discharge to [facility] tomorrow.   DME needed prior to discharge:  hospital bed, over bed table and Oxygen 1-2L/[place].  DME ordered was late Friday afternoon.  Called [facility] and they said DME has not been delivered yet.   Hospital liaison team will continue to follow through final disposition.   Thank you for allowing participation in this patient's care.   Norris Cross, RN Nurse Liaison [phone]

## 2024-04-03 NOTE — Plan of Care (Signed)

## 2024-04-03 NOTE — Progress Notes (Signed)
 PROGRESS NOTE    STAFFORD RIVIERA  ZOX:[id number] DOB: 11-Apr-1929 DOA: 03/14/2024 PCP: [name], MD    Brief Narrative:   [name] [name] is a 89 y.o. male with medical history significant for Severe PAD s/p multiple revascularization most recently 01/22/2024 for critical limb ischemia, and on chronic anticoagulation with Eliquis, systolic CHF secondary to ischemic cardiomyopathy, EF 35 to 40% 08/2023, CAD s/p CABG times 03/16/1998, HTN, who is being admitted with a small bowel obstruction, after presenting with nausea vomiting lower abdominal pain that started around noon on 3/17.  He had no diarrhea, no blood in the stool, no fever or chills.    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  Volvulus with hemorrhagic bowel encountered, no resection performed.  Plan [facility] P.o. intake for comfort  # Delirium-improved   # Acute blood loss anemia # Hemorrhagic shock # Retroperitoneal hematoma LEFT Status post 3 units of blood transfusion Stop lab draws   # SCC left neck Local wound care as needed   # Ulcer left heel Wound care as needed    Hyponatremia Stop lab draws   # CAD Hx cabg x3, stable - no chest pain Stop statin   # Hypothyroid Stop Synthroid   # Chronic HFrEF Diuretics held   # Skin tear Right forearm, happened 3/21  Local wound care   # A-fib No anticoagulation     # PAD S/p multiple vascular procedures most recently January of this year No anticoagulation or antiplatelet.  Statin stopped   # T2DM Well controlled. Last A1c 5.1    # R flank ecchymosis  Monitor   # Debility Resides at [facility]. PT advises snf [facility] return to [facility] with [facility]   #Goals of care --Code status DNR/DNI --Palliative Care consulted Will return to [facility] with hospice 4/7     DVT prophylaxis: None Code Status: DNR Family Communication: None Disposition Plan: Status is: Inpatient Remains inpatient appropriate because: Unsafe discharge plan.  Plan [facility] to [facility] with [facility]/7   Level of care: Telemetry Medical  Consultants:  Palliative care  Procedures:  None  Antimicrobials: None   Subjective: Seen and examined.  Appears lethargic  Objective: Vitals:   04/02/24 2044 04/03/24 0343 04/03/24 0423 04/03/24 0745  BP: 126/68 (!) 120/56  (!) 104/59  Pulse: 92 88  86  Resp: (!) 24 20  18   Temp: 98.4 F (36.9 C) 98.1 F (36.7 C)  98.1 F (36.7 C)  TempSrc: Oral Oral  Oral  SpO2: 94% 96%  97%  Weight:   66.8 kg   Height:        Intake/Output Summary (  Last 24 hours) at 04/03/2024 1146 Last data filed at 04/02/2024 1726 Gross per 24 hour  Intake --  Output 300 ml  Net -300 ml   Filed Weights   04/01/24 0500 04/02/24 0354 04/03/24 0423  Weight: 70.6 kg 68.5 kg 66.8 kg    Examination:  General exam: NAD.  Appears frail Respiratory system: Clear to auscultation. Respiratory effort normal.  2 L Cardiovascular system: S1-S2, tachycardic, regular rhythm, no murmurs Gastrointestinal system: Soft, NT/ND, normal bowel sounds Central nervous system: Lethargic.  Unable  to assess orientation Extremities: Decreased power bilaterally.  Gait not assessed Skin: No rashes, lesions or ulcers Psychiatry: Unable to assess    Data Reviewed: I have personally reviewed following labs and imaging studies  CBC: Recent Labs  Lab 03/30/24 0519 04/03/24 0459  WBC 9.3 8.1  NEUTROABS 7.0 5.7  HGB 8.6* 8.1*  HCT 25.6* 24.6*  MCV 91.8 92.5  PLT 247 296   Basic Metabolic Panel: Recent Labs  Lab 03/28/24 0519 03/29/24 0429 03/30/24 0519 03/31/24 0504 03/31/24 0505 04/03/24 0459  NA 132* 131* 128*  --  132* 131*  K 4.1 4.0 3.9  --  4.0 3.8  CL 96* 98 97*  --  100 100  CO2 28 25 25   --  27 25  GLUCOSE 98 89 86  --  86 90  BUN 20 19 17   --  21 19  CREATININE 0.63 0.60* 0.53*  --  0.62 0.63  CALCIUM 7.9* 7.7* 7.6*  --  7.8* 7.8*  MG 2.0  --   --  2.1  --   --   PHOS 2.7 2.7  --   --  2.7  --    GFR: Estimated Creatinine Clearance: 55.1 mL/min (by C-G formula based on SCr of 0.63 mg/dL). Liver Function Tests: Recent Labs  Lab 03/28/24 0519 03/29/24 0429 03/31/24 0505  ALBUMIN 2.3* 2.2* 2.2*   No results for input(s): "LIPASE", "AMYLASE" in the last 168 hours. No results for input(s): "AMMONIA" in the last 168 hours. Coagulation Profile: No results for input(s): "INR", "PROTIME" in the last 168 hours. Cardiac Enzymes: No results for input(s): "CKTOTAL", "CKMB", "CKMBINDEX", "TROPONINI" in the last 168 hours. BNP (last 3 results) No results for input(s): "PROBNP" in the last 8760 hours. HbA1C: No results for input(s): "HGBA1C" in the last 72 hours. CBG: No results for input(s): "GLUCAP" in the last 168 hours. Lipid Profile: No results for input(s): "CHOL", "HDL", "LDLCALC", "TRIG", "CHOLHDL", "LDLDIRECT" in the last 72 hours. Thyroid Function Tests: No results for input(s): "TSH", "T4TOTAL", "FREET4", "T3FREE", "THYROIDAB" in the last 72 hours. Anemia Panel: No results for input(s): "VITAMINB12", "FOLATE", "FERRITIN", "TIBC", "IRON",  "RETICCTPCT" in the last 72 hours. Sepsis Labs: No results for input(s): "PROCALCITON", "LATICACIDVEN" in the last 168 hours.  No results found for this or any previous visit (from the past 240 hours).       Radiology Studies: No results found.      Scheduled Meds:  vitamin C  500 mg Oral BID   docusate sodium  100 mg Oral Daily   feeding supplement  237 mL Oral TID BM   leptospermum manuka honey  1 Application Topical Daily   levothyroxine  112 mcg Oral Q0600   multivitamin with minerals  1 tablet Oral Daily   pantoprazole  40 mg Oral Daily   senna  1 tablet Oral QHS   simvastatin  40 mg Oral QPM   thiamine  100 mg Oral Daily  Continuous Infusions:   LOS: 19 days     Tresa Moore, MD [facility]   If 7PM-7AM, please contact night-coverage  04/03/2024, 11:46 AM

## 2024-04-03 NOTE — Progress Notes (Signed)
 Physical Therapy Treatment Patient Details Name: [name] [name] MRN: [id number] DOB: 30-Sep-1929 Today's Date: 04/03/2024   History of Present Illness Pt admitted for SBO s/p exploratory laparotomy. Left RP bleed with transfer to ICU. PMHx of critical limb ischemia s/p femoral endarterectomy, CHF, CAD s/p CABG, on chronic anticoagulation, immunotherapy for recurrent squamous cell carcinoma.    PT Comments  Ready for session.  Participated in exercises as described below.  He is able to sit EOB x 15 minutes this date with general cga x 1.  Static balance better than dynamic with seated AROM.  Returned to supine and assisted RN with care due to inc BM.  Overall tolerated well but global weakness makes mobility challenging for pt.     If plan is discharge home, recommend the following: Two people to help with walking and/or transfers;A lot of help with bathing/dressing/bathroom;Help with stairs or ramp for entrance;Supervision due to cognitive status   Can travel by private vehicle        Equipment Recommendations       Recommendations for Other Services       Precautions / Restrictions Precautions Precautions: Fall Recall of Precautions/Restrictions: Impaired Restrictions Weight Bearing Restrictions Per Provider Order: No     Mobility  Bed Mobility Overal bed mobility: Needs Assistance Bed Mobility: Supine to Sit, Sit to Supine     Supine to sit: Mod assist Sit to supine: Max assist     Patient Response: Cooperative  Transfers                   General transfer comment: deferred    Ambulation/Gait                   Stairs             Wheelchair Mobility     Tilt Bed Tilt Bed Patient Response: Cooperative  Modified Rankin (Stroke Patients Only)       Balance Overall balance assessment: Needs assistance Sitting-balance support: Bilateral upper extremity supported Sitting balance-Leahy Scale: Fair Sitting balance - Comments: able to  sit generally unsupported x 15 minutes.  good static. fair dynamic with BLE ex Postural control: Posterior lean     Standing balance comment: unable to stand on this date                            Communication Communication Communication: Impaired Factors Affecting Communication: Hearing impaired  Cognition Arousal: Alert Behavior During Therapy: WFL for tasks assessed/performed   PT - Cognitive impairments: History of cognitive impairments                         Following commands: Impaired Following commands impaired: Follows one step commands with increased time    Cueing Cueing Techniques: Verbal cues, Tactile cues  Exercises Other Exercises Other Exercises: supine AAROM and seated AROM    General Comments        Pertinent Vitals/Pain Pain Assessment Pain Assessment: Faces Faces Pain Scale: Hurts even more Pain Location: BLE Pain Descriptors / Indicators: Discomfort, Grimacing Pain Intervention(s): Limited activity within patient's tolerance, Monitored during session, Repositioned    Home Living                          Prior Function            PT Goals (current goals can now be found  in the care plan section) Progress towards PT goals: Progressing toward goals    Frequency    Min 1X/week      PT Plan      Co-evaluation              AM-PAC PT "6 Clicks" Mobility   Outcome Measure  Help needed turning from your back to your side while in a flat bed without using bedrails?: A Lot Help needed moving from lying on your back to sitting on the side of a flat bed without using bedrails?: A Lot Help needed moving to and from a bed to a chair (including a wheelchair)?: Total Help needed standing up from a chair using your arms (e.g., wheelchair or bedside chair)?: Total Help needed to walk in hospital room?: Total Help needed climbing 3-5 steps with a railing? : Total 6 Click Score: 8    End of Session  Equipment Utilized During Treatment: Oxygen Activity Tolerance: Patient limited by fatigue;Patient tolerated treatment well Patient left: in bed;with call bell/phone within reach;with bed alarm set;with family/visitor present;with nursing/sitter in room Nurse Communication: Mobility status PT Visit Diagnosis: Unsteadiness on feet (R26.81);Muscle weakness (generalized) (M62.81);History of falling (Z91.81);Difficulty in walking, not elsewhere classified (R26.2)     Time: 4098-1191 PT Time Calculation (min) (ACUTE ONLY): 32 min  Charges:    $Therapeutic Exercise: 8-22 mins $Therapeutic Activity: 8-22 mins PT General Charges $$ ACUTE PT VISIT: 1 Visit                   Danielle Dess, PTA 04/03/24, 2:32 PM

## 2024-04-03 NOTE — Progress Notes (Signed)
 Sports coach Note  DME being delivered to [facility] today.  Norris Cross, RN Nurse Liaison [phone]

## 2024-04-04 ENCOUNTER — Encounter (HOSPITAL_COMMUNITY): Payer: PPO

## 2024-04-04 ENCOUNTER — Ambulatory Visit: Payer: PPO

## 2024-04-04 DIAGNOSIS — K56609 Unspecified intestinal obstruction, unspecified as to partial versus complete obstruction: Secondary | ICD-10-CM | POA: Diagnosis not present

## 2024-04-04 MED ORDER — ACETAMINOPHEN 325 MG PO TABS: 650.0000 mg | ORAL_TABLET | Freq: Four times a day (QID) | ORAL | Status: DC | PRN

## 2024-04-04 MED ORDER — POLYETHYLENE GLYCOL 3350 17 G PO PACK: 17.0000 g | PACK | Freq: Every day | ORAL | Status: DC | PRN

## 2024-04-04 MED ORDER — SENNA 8.6 MG PO TABS: 1.0000 | ORAL_TABLET | Freq: Every day | ORAL | Status: DC

## 2024-04-04 MED ORDER — DOCUSATE SODIUM 100 MG PO CAPS: 100.0000 mg | ORAL_CAPSULE | Freq: Every day | ORAL | Status: DC

## 2024-04-04 MED ORDER — MEDIHONEY WOUND/BURN DRESSING EX PSTE: 1.0000 | PASTE | Freq: Every day | CUTANEOUS | 1 refills | Status: DC

## 2024-04-04 MED ORDER — ENSURE ENLIVE PO LIQD: 237.0000 mL | Freq: Three times a day (TID) | ORAL | Status: DC

## 2024-04-04 NOTE — Plan of Care (Signed)
  Problem: Education: Goal: Knowledge of General Education information will improve Description: Including pain rating scale, medication(s)/side effects and non-pharmacologic comfort measures 04/04/2024 0438 by Eldridge Abrahams, RN Outcome: Progressing 04/04/2024 0437 by Eldridge Abrahams, RN Outcome: Progressing   Problem: Health Behavior/Discharge Planning: Goal: Ability to manage health-related needs will improve 04/04/2024 0438 by Eldridge Abrahams, RN Outcome: Progressing 04/04/2024 0437 by Eldridge Abrahams, RN Outcome: Progressing   Problem: Clinical Measurements: Goal: Ability to maintain clinical measurements within normal limits will improve 04/04/2024 0438 by Eldridge Abrahams, RN Outcome: Progressing 04/04/2024 0437 by Eldridge Abrahams, RN Outcome: Progressing Goal: Will remain free from infection 04/04/2024 0438 by Eldridge Abrahams, RN Outcome: Progressing 04/04/2024 0437 by Eldridge Abrahams, RN Outcome: Progressing Goal: Diagnostic test results will improve 04/04/2024 0438 by Eldridge Abrahams, RN Outcome: Progressing 04/04/2024 0437 by Eldridge Abrahams, RN Outcome: Progressing Goal: Respiratory complications will improve 04/04/2024 0438 by Eldridge Abrahams, RN Outcome: Progressing 04/04/2024 0437 by Eldridge Abrahams, RN Outcome: Progressing Goal: Cardiovascular complication will be avoided 04/04/2024 0438 by Eldridge Abrahams, RN Outcome: Progressing 04/04/2024 0437 by Eldridge Abrahams, RN Outcome: Progressing   Problem: Activity: Goal: Risk for activity intolerance will decrease 04/04/2024 0438 by Eldridge Abrahams, RN Outcome: Progressing 04/04/2024 0437 by Eldridge Abrahams, RN Outcome: Progressing   Problem: Nutrition: Goal: Adequate nutrition will be maintained 04/04/2024 0438 by Eldridge Abrahams, RN Outcome: Progressing 04/04/2024 0437 by Eldridge Abrahams, RN Outcome: Progressing   Problem: Coping: Goal: Level of anxiety will decrease 04/04/2024 0438 by Eldridge Abrahams, RN Outcome: Progressing 04/04/2024 0437 by Eldridge Abrahams, RN Outcome: Progressing   Problem: Elimination: Goal: Will not experience complications related to bowel motility 04/04/2024 0438 by Eldridge Abrahams, RN Outcome: Progressing 04/04/2024 0437 by Eldridge Abrahams, RN Outcome: Progressing Goal: Will not experience complications related to urinary retention 04/04/2024 0438 by Eldridge Abrahams, RN Outcome: Progressing 04/04/2024 0437 by Eldridge Abrahams, RN Outcome: Progressing   Problem: Pain Managment: Goal: General experience of comfort will improve and/or be controlled 04/04/2024 0438 by Eldridge Abrahams, RN Outcome: Progressing 04/04/2024 0437 by Eldridge Abrahams, RN Outcome: Progressing   Problem: Safety: Goal: Ability to remain free from injury will improve 04/04/2024 0438 by Eldridge Abrahams, RN Outcome: Progressing 04/04/2024 0437 by Eldridge Abrahams, RN Outcome: Progressing   Problem: Skin Integrity: Goal: Risk for impaired skin integrity will decrease 04/04/2024 0438 by Eldridge Abrahams, RN Outcome: Progressing 04/04/2024 0437 by Eldridge Abrahams, RN Outcome: Progressing

## 2024-04-04 NOTE — Plan of Care (Signed)

## 2024-04-04 NOTE — TOC Progression Note (Addendum)
 Transition of Care [facility]) - Progression Note    Patient Details  Name: [name] [name] MRN: [id number] Date of Birth: 1929-11-23  [facility] CM/SW Contact  Chapman Fitch, RN Phone Number: 04/04/2024, 3:34 PM  Clinical Narrative:      [facility] confirms patient can return today Ree Kida with [employer] notified [facility] confirms that DME has been delivered Signed DNR and EMS packet on chart Awaiting HTA auth for transport   Update:  Auth received for [facility] transport Transport arranged.  They state transport will be 630-7.  Daughter [name] updated.  Bedside RN has called report to [facility]        Expected Discharge Plan and Services         Expected Discharge Date: 04/04/24                                     Social Determinants of Health (SDOH) Interventions SDOH Screenings   Food Insecurity: No Food Insecurity (03/15/2024)  Housing: Low Risk  (03/15/2024)  Transportation Needs: No Transportation Needs (03/15/2024)  Utilities: Not At Risk (03/15/2024)  Depression (PHQ2-9): Low Risk  (02/09/2024)  Financial Resource Strain: Low Risk  (09/22/2023)   Received from [facility]  Social Connections: Moderately Isolated (03/15/2024)  Tobacco Use: Low Risk  (03/15/2024)    Readmission Risk Interventions     No data to display

## 2024-04-04 NOTE — Care Management Important Message (Signed)
 Important Message  Patient Details  Name: [name] [name] MRN: [id number] Date of Birth: [birth year]   Important Message Given:  Yes - Medicare IM     Marcell Anger 04/04/2024, 10:40 AM

## 2024-04-04 NOTE — Progress Notes (Signed)
 Mobility Specialist - Progress Note   04/04/24 1044  Mobility  Activity Transferred from bed to chair  Level of Assistance +2 (takes two people)  Assistive Device None  Activity Response Tolerated well  Mobility visit 1 Mobility  Mobility Specialist Start Time (ACUTE ONLY) 1017  Mobility Specialist Stop Time (ACUTE ONLY) 1032  Mobility Specialist Time Calculation (min) (ACUTE ONLY) 15 min   Pt supine upon entry, utilizing RA. Pt agreeable to transfer this date, reporting RLE soreness. Pt completed bed mob MinA to bring BLE EOB, dangled EOB for ~2 mins--- L lateral lean, requiring CGA for trunk support once seated EOB. Pt transferred to the recliner via R lateral slide transfer +2 for safety. Pt left seated with alarm set and needs within reach. RN notified.  Zetta Bills Mobility Specialist 04/04/24 11:10 AM

## 2024-04-04 NOTE — NC FL2 (Signed)
 [place] MEDICAID FL2 LEVEL OF CARE FORM     IDENTIFICATION  Patient Name: [name] [name] Birthdate: [birth year] Sex: male Admission Date (Current Location): 03/14/2024  [facility] and [place] Number:  Chiropodist and Address:  [facility], [address], [place], [place] [address]      Provider Number: [id number]  Attending Physician Name and Address:  Pennie Banter, DO  Relative Name and Phone Number:       Current Level of Care: Hospital Recommended Level of Care: Assisted Living Facility, Other (Comment) (with hospice) Prior Approval Number:    Date Approved/Denied:   PASRR Number: [id number]  Discharge Plan: Other (Comment) (ALF with hospice)    Current Diagnoses: Patient Active Problem List   Diagnosis Date Noted   Nontraumatic retroperitoneal hematoma 03/21/2024   ABLA (acute blood loss anemia) 03/21/2024   Hemorrhagic shock (HCC) 03/21/2024   Protein-calorie malnutrition, severe 03/17/2024   Small bowel obstruction (HCC) 03/15/2024   PVD (peripheral vascular disease) (HCC) 03/15/2024   Ischemic cardiomyopathy 03/15/2024   Chronic anticoagulation 03/15/2024   Lactic acidosis 03/15/2024   History of revascularization procedure of lower extremity 03/15/2024   Recurrent squamous cell carcinoma in situ (SCCIS) of skin of cheek 02/09/2024   Symptomatic anemia 02/09/2024   Critical limb ischemia of left lower extremity (HCC) 01/21/2024   Pressure injury of skin of dorsum of left foot 09/27/2023   Fall 09/26/2023   HFrEF (heart failure with reduced ejection fraction) (HCC) 09/26/2023   Syncope 09/24/2023   Chronic heart failure with reduced ejection fraction (HFrEF, <= 40%) (HCC) 09/04/2021   Atherosclerosis of native arteries of the extremities with ulceration (HCC) 09/04/2021   Atherosclerosis of artery of extremity with rest pain (HCC) 08/30/2021   Atherosclerosis of native arteries of extremity with intermittent  claudication (HCC) 02/18/2021   Cataract cortical, senile 02/11/2021   Cor pulmonale, acute (HCC) 05/02/2020   Type 2 diabetes mellitus with peripheral angiopathy (HCC) 11/10/2018   Medicare annual wellness visit, initial 05/05/2017   Varicose veins of lower extremities with ulcer (HCC) 04/23/2017   Chronic venous insufficiency 04/23/2017   Leg pain 04/23/2017   Lymphedema 04/23/2017   A-fib (HCC) 04/23/2017   Hyperlipidemia 04/23/2017   Acquired hypothyroidism 11/03/2016   B12 deficiency 10/27/2014   CAD with history of CABG x3 (coronary artery disease) 04/25/2014   GERD (gastroesophageal reflux disease) 04/25/2014   HTN (hypertension), benign 04/25/2014   S/P CABG x 3 02/26/1993    Orientation RESPIRATION BLADDER Height & Weight     Self, Place, Situation  O2 (1L) Incontinent Weight: 66.8 kg Height:  5\' 7"  (170.2 cm)  BEHAVIORAL SYMPTOMS/MOOD NEUROLOGICAL BOWEL NUTRITION STATUS   (None)  (None) Incontinent Diet (dys 3 thin liquid)  AMBULATORY STATUS COMMUNICATION OF NEEDS Skin   Extensive Assist Verbally Surgical wounds, Skin abrasions, Bruising (Erythema/redness. Unstageable pressure injury on left heel (gauze daily), Incision on abdomen (no dressing), and skin tear on right arm (gauze every other day).)                       Personal Care Assistance Level of Assistance  Bathing, Feeding, Dressing Bathing Assistance: Maximum assistance Feeding assistance: Limited assistance Dressing Assistance: Maximum assistance     Functional Limitations Info  Sight, Speech, Hearing Sight Info: Adequate Hearing Info: Impaired Speech Info: Adequate    SPECIAL CARE FACTORS FREQUENCY  PT (By licensed PT), OT (By licensed OT)  Contractures Contractures Info: Not present    Additional Factors Info  Code Status Code Status Info: DNR Allergies Info: Cephalexin, Spironolactone, Codeine, Doxycyline           Medication List       STOP taking these  medications     apixaban 2.5 MG Tabs tablet Commonly known as: Eliquis    aspirin EC 81 MG tablet    cyanocobalamin 1000 MCG tablet Commonly known as: VITAMIN B12    ferrous sulfate 325 (65 FE) MG EC tablet    levothyroxine 112 MCG tablet Commonly known as: SYNTHROID    metoprolol succinate 25 MG 24 hr tablet Commonly known as: TOPROL-XL    predniSONE 10 MG tablet Commonly known as: DELTASONE    simvastatin 40 MG tablet Commonly known as: ZOCOR    torsemide 10 MG tablet Commonly known as: DEMADEX           TAKE these medications     acetaminophen 325 MG tablet Commonly known as: TYLENOL Take 2 tablets (650 mg total) by mouth every 6 (six) hours as needed for mild pain (pain score 1-3). What changed:  when to take this reasons to take this    docusate sodium 100 MG capsule Commonly known as: COLACE Take 1 capsule (100 mg total) by mouth daily. Start taking on: April 05, 2024    feeding supplement Liqd Take 237 mLs by mouth 3 (three) times daily between meals.    leptospermum manuka honey Pste paste Apply 1 Application topically daily. Start taking on: April 05, 2024    ondansetron 8 MG tablet Commonly known as: ZOFRAN Take 1 tablet (8 mg total) by mouth every 8 (eight) hours as needed for nausea or vomiting.    polyethylene glycol 17 g packet Commonly known as: MIRALAX / GLYCOLAX Take 17 g by mouth daily as needed for mild constipation.    senna 8.6 MG Tabs tablet Commonly known as: SENOKOT Take 1 tablet (8.6 mg total) by mouth at bedtime.      Relevant Imaging Results:  Relevant Lab Results:   Additional Information SS#: SS#: [id number].  Chapman Fitch, RN

## 2024-04-04 NOTE — Discharge Summary (Signed)
 Physician Discharge Summary   Patient: [name] [name] MRN: [id number] DOB: 1929-08-24  Admit date:     03/14/2024  Discharge date: 04/04/24  Discharge Physician: Pennie Banter   PCP: [name], MD   Recommendations at discharge:    Follow up with Hospice team at [facility]  Discharge Diagnoses: Principal Problem:   Small bowel obstruction (HCC) Active Problems:   Lactic acidosis   CAD with history of CABG x3 (coronary artery disease)   PVD (peripheral vascular disease) (HCC)   Chronic anticoagulation   History of revascularization procedure of lower extremity   Chronic heart failure with reduced ejection fraction (HFrEF, <= 40%) (HCC)   Ischemic cardiomyopathy   HTN (hypertension), benign   Acquired hypothyroidism   A-fib (HCC)   Type 2 diabetes mellitus with peripheral angiopathy (HCC)   Recurrent squamous cell carcinoma in situ (SCCIS) of skin of cheek   Protein-calorie malnutrition, severe   Nontraumatic retroperitoneal hematoma   ABLA (acute blood loss anemia)   Hemorrhagic shock (HCC)  Resolved Problems:   * No resolved hospital problems. *  Hospital Course:  "[name] [name] is a 89 y.o. male with medical history significant for Severe PAD s/p multiple revascularization most recently 01/22/2024 for critical limb ischemia, and on chronic anticoagulation with Eliquis, systolic CHF secondary to ischemic cardiomyopathy, EF 35 to 40% 08/2023, CAD s/p CABG times 03/16/1998, HTN, who is being admitted with a small bowel obstruction, after presenting with nausea vomiting lower abdominal pain that started around noon on 3/17.  He had no diarrhea, no blood in the stool, no fever or chills. "  Hospital course and management as outlined below. In summary, hospital and post-operative course were complicated by acute retroperitoneal bleed with hypotensive shock in setting of IV heparin, requiring transfer to ICU for pressors.    Palliative care was consulted for goals of  care discussions and patient and family have decided to transition to hospice care.  4/7 --- I resumed care of patient this morning.  He remains medically stable and agreeable with discharge to [facility] with hospice today.  Daughter at bedside and also in agreement.    Assessment and Plan:  # Small bowel obstruction status post surgery Patient is status post exploratory lap with lysis of adhesions 03/15/24.  Volvulus with hemorrhagic bowel encountered, no resection performed.  Plan for [facility] to [facility] P.o. intake for comfort   # Delirium-improved   # Acute blood loss anemia # Hemorrhagic shock # Retroperitoneal hematoma LEFT Status post 3 units of blood transfusion Stop lab draws   # SCC left neck Local wound care as needed   # Ulcer left heel Wound care as needed   Hyponatremia Stop lab draws   # CAD Hx cabg x3, stable - no chest pain Stop statin   # Hypothyroid Stop Synthroid   # Chronic HFrEF Diuretics held   # Skin tear Right forearm, happened 3/21  Local wound care   # A-fib No anticoagulation     # PAD S/p multiple vascular procedures most recently January of this year No anticoagulation or antiplatelet.  Statin stopped   # T2DM Well controlled. Last A1c 5.1    # R flank ecchymosis  Monitor   # Debility Resides at [facility]. PT advises snf Will return to [facility] with hospice   #Goals of care --Code status DNR/DNI --Palliative Care consulted Will return to [facility] with hospice 4/7  Consultants: Surgery, Cardiology, PCCM, Palliative Care  Procedures performed: as above   Disposition: Hospice care  Diet recommendation:  Dysphagia type 3 Thin Liquid  DISCHARGE MEDICATION: Allergies as of 04/04/2024       Reactions   Cephalexin Rash   Spironolactone Nausea Only   Codeine Rash, Other (See Comments)   Can not sleep   Doxycycline Rash        Medication List     STOP taking these medications     apixaban 2.5 MG Tabs tablet Commonly known as: Eliquis   aspirin EC 81 MG tablet   cyanocobalamin 1000 MCG tablet Commonly known as: VITAMIN B12   ferrous sulfate 325 (65 FE) MG EC tablet   levothyroxine 112 MCG tablet Commonly known as: SYNTHROID   metoprolol succinate 25 MG 24 hr tablet Commonly known as: TOPROL-XL   predniSONE 10 MG tablet Commonly known as: DELTASONE   simvastatin 40 MG tablet Commonly known as: ZOCOR   torsemide 10 MG tablet Commonly known as: DEMADEX       TAKE these medications    acetaminophen 325 MG tablet Commonly known as: TYLENOL Take 2 tablets (650 mg total) by mouth every 6 (six) hours as needed for mild pain (pain score 1-3). What changed:  when to take this reasons to take this   docusate sodium 100 MG capsule Commonly known as: COLACE Take 1 capsule (100 mg total) by mouth daily. Start taking on: April 05, 2024   feeding supplement Liqd Take 237 mLs by mouth 3 (three) times daily between meals.   leptospermum manuka honey Pste paste Apply 1 Application topically daily. Start taking on: April 05, 2024   ondansetron 8 MG tablet Commonly known as: ZOFRAN Take 1 tablet (8 mg total) by mouth every 8 (eight) hours as needed for nausea or vomiting.   polyethylene glycol 17 g packet Commonly known as: MIRALAX / GLYCOLAX Take 17 g by mouth daily as needed for mild constipation.   senna 8.6 MG Tabs tablet Commonly known as: SENOKOT Take 1 tablet (8.6 mg total) by mouth at bedtime.               Discharge Care Instructions  (From admission, onward)           Start     Ordered   04/04/24 0000  Discharge wound care:       Comments: As above   04/04/24 1116            Follow-up Information     Kandis Cocking, MD. Schedule an appointment as soon as possible for a visit in 1 week(s).   Specialty: General Surgery Why: s/p ex lap, needs staple removal Contact information: [address] [place] [address] [phone]                Discharge Exam: Filed Weights   04/01/24 0500 04/02/24 0354 04/03/24 0423  Weight: 70.6 kg 68.5 kg 66.8 kg   General exam: awake, alert, no acute distress, frail HEENT: moist mucus membranes, hearing grossly normal  Respiratory system: CTAB, no wheezes, rales or rhonchi, normal respiratory effort. Cardiovascular system: normal S1/S2, RRR Gastrointestinal system: soft, NT, ND Central nervous system: no gross focal neurologic deficits, normal speech Skin: dry, intact, normal temperature Psychiatry: normal mood, congruent affect, judgement and insight appear normal   Condition at discharge: stable  The results of significant diagnostics from this hospitalization (including imaging, microbiology, ancillary and laboratory) are listed below for reference.  Imaging Studies: CT Angio Abd/Pel w/ and/or w/o Result Date: 03/20/2024 CLINICAL DATA:  Nonvariceal upper GI bleed.  Negative endoscopy. EXAM: CTA ABDOMEN AND PELVIS WITHOUT AND WITH CONTRAST TECHNIQUE: Multidetector CT imaging of the abdomen and pelvis was performed using the standard protocol during bolus administration of intravenous contrast. Multiplanar reconstructed images and MIPs were obtained and reviewed to evaluate the vascular anatomy. RADIATION DOSE REDUCTION: This exam was performed according to the departmental dose-optimization program which includes automated exposure control, adjustment of the mA and/or kV according to patient size and/or use of iterative reconstruction technique. CONTRAST:  75mL OMNIPAQUE IOHEXOL 350 MG/ML SOLN COMPARISON:  CT chest, abdomen and pelvis with IV contrast 03/15/2024, CT abdomen and pelvis with IV contrast 04/27/2023. FINDINGS: VASCULAR Aorta: Heavily calcified but without flow limiting stenosis, occlusion, aneurysm or dissection. Celiac: Ostial calcific plaques cause at least an 80% high-grade origin stenosis. The rest of the vessel  and branches opacify well. SMA: There are moderate to heavy patchy calcifications. There is a 50% vessel origin stenosis, mild less than 40% calcific stenosis of the remainder. The branch arteries opacified well. Renals: Both are single. There is high-grade calcific stenosis in both proximal renal arteries. Remainder of both renal arteries and the hilar branches opacified well. IMA: There is high-grade calcific stenosis in the proximal 2 cm of the vessel. Remainder opacifies well possibly through back fill. Inflow: There is a short segment calcified dissection flap chronically in the distal left common iliac artery. There are patchy calcific plaques in the common iliac and internal arteries, relatively minimal calcification in the external iliac arteries. No flow limiting stenosis of inflow vessels is seen. Proximal Outflow: There is a 60% calcific stenosis in right common femoral artery. Visualized outflow vessels also demonstrate scattered calcific plaque with no other flow limiting stenosis. Veins: There is a collapsed IVC which could be due to hypovolemia related to left retroperitoneal hemorrhage. No other significant venous findings are seen on this arterial phase study. Review of the MIP images confirms the above findings. NON-VASCULAR Lower chest: Stable panchamber cardiomegaly with left main and heavy three-vessel coronary calcifications. No pericardial effusion. There are moderate bilateral pleural effusions, new on the left and increased on the right. There is compressive collapse or consolidation in both lower lobes. Old CABG changes. Hepatobiliary: Tiny layering cholelithiasis. No gallbladder wall thickening or bile duct dilatation. No liver mass enhancement. Cirrhotic capsular nodularity of the liver is again noted. Pancreas: The pancreas partially atrophic and otherwise unremarkable. Spleen: No mass, no splenic injury is seen.  No splenomegaly. Adrenals/Urinary Tract: There is cortical thinning and  volume loss of both kidneys. Small Bosniak 1 and 2 cysts on the left. No focal abnormality on the right. There is no adrenal mass. There is no urinary stone or obstruction. The bladder is contracted although could be thickened. There is trace air in the bladder. Correlate clinically for cystitis. Air in the bladder could be due to catheterization or fistula. Stomach/Bowel: No contrast extravasation is seen into the stomach and small bowel. Contrast is in the colon through into the rectum and would obscure an active colonic bleed if present. There previously was mesenteric swirling centrally and moderate dilatation of multiple small bowel segments. The central mesenteric swirling is not seen today. There is still mesenteric clockwise swirling in the right lower abdomen, but the small bowel dilatation noted previously is no longer seen. There is still mild thickening in the small bowel wall in the mid and lower abdomen. This could be  inflammatory, congestive or ischemic but there is no bowel wall pneumatosis or portal venous gas. The appendix is normal. The wall of the large bowel is mostly contracted, unremarkable. Lymphatic: No enlarged lymph nodes. Reproductive: No prostatomegaly. Other: New from 5 days ago, there is enlargement and heterogeneity of the left psoas muscle consistent with interval psoas hemorrhage, extending out into the mid to distal left paracolic gutter. There is a large retroperitoneal hematoma in this area, with fluid-sediment levels. Including the psoas muscle, the left retroperitoneal hematoma measures 11.7 x 10.9 cm on 13:54, and 18.2 cm craniocaudal on 15: 57. There is a small amount of additional free hemorrhage in the left para colic gutter and posterior pelvis. In the upper abdomen, mild perihepatic ascites continues to be seen with additional low-density ascites settling above the bladder and within a left inguinal hernia. This fluid is also low in density. No free air is seen.  There is  no incarcerated hernia. Musculoskeletal: There is osteopenia, degenerative change of the spine. Unchanged L1 compression fracture. Ankylosis over both SI joints. No acute or other significant osseous findings. IMPRESSION: 1. New 11.7 x 10.9 x 18.2 cm left retroperitoneal hematoma beginning in the psoas muscle with fluid-sediment levels, and a small amount of additional free hemorrhage in the left paracolic gutter and posterior pelvis. 2. No contrast extravasation is seen into the stomach and small bowel. Contrast is in the colon through into the rectum and would obscure an active colonic bleed if present. 3. Aortic and coronary artery atherosclerosis. 4. 80% high-grade calcific stenosis of the celiac artery origin. 5. 50% calcific stenosis of the SMA. 6. High-grade calcific stenosis of both proximal renal arteries. 7. 60% calcific stenosis of the right common femoral artery. 8. Collapsed IVC which could be due to hypovolemia related to left retroperitoneal hemorrhage. 9. Moderate bilateral pleural effusions, new on the left and increased on the right. 10. Compressive collapse or consolidation in both lower lobes. 11. Cholelithiasis. 12. Cirrhotic liver with additionally again noted low-density ascites. 13. Persistent mild thickening in the small bowel wall in the mid and lower abdomen. This could be inflammatory, congestive or ischemic. No bowel wall pneumatosis or portal venous gas. 14. There previously was small-bowel dilatation which is no longer seen but there is still mesenteric vascular swirling in the right lower abdomen that could indicate mesenteric volvulus. 15. Trace air in the bladder. Correlate clinically for cystitis. Air in the bladder could be due to catheterization or fistula. 16. Osteopenia and degenerative change. Unchanged L1 compression fracture. 17. Critical Value/emergent results were called by telephone at the time of interpretation on 03/20/2024 at 11:27 pm to provider [name] [name] , who  verbally acknowledged these results. Electronically Signed   By: Almira Bar M.D.   On: 03/20/2024 23:41   DG Chest Port 1 View Result Date: 03/20/2024 CLINICAL DATA:  Shortness of breath with nausea and vomiting, initial encounter EXAM: PORTABLE CHEST 1 VIEW COMPARISON:  02/20/2023 FINDINGS: Cardiac shadow is enlarged. Postsurgical changes are again seen. Elevation of left hemidiaphragm is noted. New right-sided pleural effusion is seen. No focal confluent infiltrate is noted. IMPRESSION: New right-sided pleural effusion. Electronically Signed   By: Alcide Clever M.D.   On: 03/20/2024 20:02   DG Abd 2 Views Result Date: 03/19/2024 CLINICAL DATA:  Small bowel obstruction EXAM: ABDOMEN - 2 VIEW COMPARISON:  03/18/2024 FINDINGS: Right pleural effusion.  Right basilar airspace opacity. Contrast medium in the colon. Small amount of contrast medium in small bowel loops. Mildly  dilated small bowel loops up to 4 cm in diameter. On upright view, some of the small bowel loops right slightly differing vertical heights. IMPRESSION: 1. Mildly dilated small bowel loops up to 4 cm in diameter. On upright view, some of the small bowel loops right slightly differing vertical heights. This could reflect partial small bowel obstruction. 2. Contrast medium primarily in the colon. 3. Right pleural effusion and right basilar airspace opacity. Electronically Signed   By: Gaylyn Rong M.D.   On: 03/19/2024 12:42   DG Abd Portable 1V-Small Bowel Obstruction Protocol-initial, 8 hr delay Result Date: 03/18/2024 CLINICAL DATA:  8 hour small-bowel follow-up film EXAM: PORTABLE ABDOMEN - 1 VIEW COMPARISON:  Film from earlier in the same day. FINDINGS: Contrast material is again noted within the stomach. Multiple dilated loops of small bowel are noted. Contrast has passed into the proximal colon although some residual in the small bowel is noted. Changes are consistent with a partial small bowel obstruction. IMPRESSION: Contrast  material is now noted within the proximal colon. Electronically Signed   By: Alcide Clever M.D.   On: 03/18/2024 20:50   DG Abd 2 Views Result Date: 03/18/2024 CLINICAL DATA:  Ileus. EXAM: ABDOMEN - 2 VIEW COMPARISON:  03/17/2024 FINDINGS: Midline skin staples again noted. Oral contrast is seen within moderately dilated small bowel loops in the mid and lower abdomen. A contrast collection in the right lower quadrant has a more rounded appearance, and may be located within the cecum, although this cannot be stated with certainty. Some bowel gas and stool is seen within colon which appears mildly distended. Right pleural effusion and right basilar atelectasis are also noted. IMPRESSION: Oral contrast in moderately dilated small bowel loops, and question of some oral contrast reaching the cecum. Recommend continued radiographic follow up in several hours. Right pleural effusion and right basilar atelectasis. Electronically Signed   By: Danae Orleans M.D.   On: 03/18/2024 16:55   DG Abd Portable 1V Result Date: 03/17/2024 CLINICAL DATA:  Ileus.  Small-bowel obstruction. EXAM: PORTABLE ABDOMEN - 1 VIEW COMPARISON:  June 22, 2022. FINDINGS: Moderately dilated small bowel loops are noted concerning for distal small bowel obstruction or possibly ileus. No colonic dilatation is noted. Stool is noted in the transverse colon. Midline surgical staples are noted. Phleboliths are noted in the pelvis. IMPRESSION: Moderately dilated small bowel loops are noted concerning for distal small bowel obstruction or possibly ileus. Electronically Signed   By: Lupita Raider M.D.   On: 03/17/2024 12:18   CT CHEST ABDOMEN PELVIS W CONTRAST Result Date: 03/15/2024 CLINICAL DATA:  Abdominal pain, nausea, vomiting, ecchymosis in the right chest, abdomen, and pelvis. On anticoagulant. EXAM: CT CHEST, ABDOMEN, AND PELVIS WITH CONTRAST TECHNIQUE: Multidetector CT imaging of the chest, abdomen and pelvis was performed following the standard  protocol during bolus administration of intravenous contrast. RADIATION DOSE REDUCTION: This exam was performed according to the departmental dose-optimization program which includes automated exposure control, adjustment of the mA and/or kV according to patient size and/or use of iterative reconstruction technique. CONTRAST:  OMNIPAQUE IOHEXOL 300 MG/ML  SOLN COMPARISON:  PET/CT 07/24/2023; CT abdomen pelvis 04/27/2023 FINDINGS: CT CHEST FINDINGS Cardiovascular: Cardiomegaly. No pericardial effusion. Coronary artery and aortic atherosclerotic calcification. Mediastinum/Nodes: No enlarged mediastinal, hilar, or axillary lymph nodes. Thyroid gland, trachea, and esophagus demonstrate no significant findings. Lungs/Pleura: Small right pleural effusion and compressive atelectasis in the right lower lobe. Additional atelectasis in the left lower lobe. No pneumothorax. Centrilobular micro nodules  in the right middle lobe compatible with small airway infection/inflammation. Musculoskeletal: Compression fracture of T12 is increased compared to 07/24/2023. There is 40% vertebral body height loss anteriorly. No retropulsion. CT ABDOMEN PELVIS FINDINGS Hepatobiliary: Cholelithiasis. No evidence of acute cholecystitis. No biliary dilation. No acute Paddock abnormality. Pancreas: Unremarkable. Spleen: Unremarkable. Adrenals/Urinary Tract: Normal adrenal glands. Atrophic native kidneys. No urinary calculi or hydronephrosis. Unremarkable bladder. Stomach/Bowel: Stomach is within normal limits. Diffuse dilation of the small bowel with transition point in the anterior left abdomen (circa series 5/image 39). There is swirling of vessels in the central mesentery. The dilated bowel demonstrates mild wall thickening. Adjacent mesenteric stranding, interloop free fluid and vascular congestion. Bowel ischemia from internal hernia or closed loop obstruction are not excluded. No pneumatosis. Normal colon without wall thickening.  Normal appendix. Vascular/Lymphatic: Aortic atherosclerotic calcification. No lymphadenopathy. Reproductive: No acute abnormality. Other: Small volume abdominopelvic ascites. No free intraperitoneal air. Musculoskeletal: 9 no acute fracture. IMPRESSION: 1. Small-bowel obstruction with transition point in the anterior left abdomen. There is swirling of vessels in the central mesentery. The dilated bowel demonstrates mild wall thickening. Adjacent mesenteric stranding, interloop free fluid and vascular congestion. Developing small bowel ischemia from internal hernia or closed loop obstruction are not excluded. Surgical consult recommended. 2. Small right pleural effusion and compressive atelectasis in the right lower lobe. 3. Centrilobular micro nodules in the right middle lobe compatible with small airway infection/inflammation. 4. Compression fracture of T12 is increased compared to 07/24/2023. 5. Aortic Atherosclerosis (ICD10-I70.0). Electronically Signed   By: Minerva Fester M.D.   On: 03/15/2024 01:41    Microbiology: Results for orders placed or performed during the hospital encounter of 03/14/24  Culture, blood (routine x 2)     Status: None   Collection Time: 03/15/24  2:09 AM   Specimen: BLOOD  Result Value Ref Range Status   Specimen Description BLOOD LEFT ARM  Final   Special Requests   Final    BOTTLES DRAWN AEROBIC AND ANAEROBIC Blood Culture results may not be optimal due to an inadequate volume of blood received in culture bottles   Culture   Final    NO GROWTH 5 DAYS Performed at [facility], [address] [place], [place] [address]    Report Status 03/20/2024 FINAL  Final  Culture, blood (routine x 2)     Status: None   Collection Time: 03/15/24  4:05 AM   Specimen: BLOOD  Result Value Ref Range Status   Specimen Description BLOOD RIGHT AC  Final   Special Requests   Final    BOTTLES DRAWN AEROBIC AND ANAEROBIC Blood Culture results may not be optimal due to an  inadequate volume of blood received in culture bottles   Culture   Final    NO GROWTH 5 DAYS Performed at [facility], [address] [place], [place] [address]    Report Status 03/20/2024 FINAL  Final  MRSA Next Gen by PCR, Nasal     Status: None   Collection Time: 03/20/24  7:51 PM   Specimen: Nasal Mucosa; Nasal Swab  Result Value Ref Range Status   MRSA by PCR Next Gen NOT DETECTED NOT DETECTED Final    Comment: (NOTE) The GeneXpert MRSA Assay (FDA approved for NASAL specimens only), is one component of a comprehensive MRSA colonization surveillance program. It is not intended to diagnose MRSA infection nor to guide or monitor treatment for MRSA infections. Test performance is not FDA approved in patients less than 2 years  old. Performed at [facility], [address] [place], [place] [address]     Labs: CBC: Recent Labs  Lab 03/30/24 0519 04/03/24 0459  WBC 9.3 8.1  NEUTROABS 7.0 5.7  HGB 8.6* 8.1*  HCT 25.6* 24.6*  MCV 91.8 92.5  PLT 247 296   Basic Metabolic Panel: Recent Labs  Lab 03/29/24 0429 03/30/24 0519 03/31/24 0504 03/31/24 0505 04/03/24 0459  NA 131* 128*  --  132* 131*  K 4.0 3.9  --  4.0 3.8  CL 98 97*  --  100 100  CO2 25 25  --  27 25  GLUCOSE 89 86  --  86 90  BUN 19 17  --  21 19  CREATININE 0.60* 0.53*  --  0.62 0.63  CALCIUM 7.7* 7.6*  --  7.8* 7.8*  MG  --   --  2.1  --   --   PHOS 2.7  --   --  2.7  --    Liver Function Tests: Recent Labs  Lab 03/29/24 0429 03/31/24 0505  ALBUMIN 2.2* 2.2*   CBG: No results for input(s): "GLUCAP" in the last 168 hours.  Discharge time spent: less than 30 minutes.  Signed: Pennie Banter, DO [facility] 04/04/2024

## 2024-04-05 NOTE — Progress Notes (Signed)
 Speech Language Pathology Treatment: Dysphagia  Patient Details Name: [name] [name] MRN: [id number] DOB: 17-Sep-1929 Today's Date: 04/05/2024 Time: 1325-1400 SLP Time Calculation (min) (ACUTE ONLY): 35 min  Assessment / Plan / Recommendation Clinical Impression  Pt seen for ongoing assessment of swallowing and toleration of diet post evaluation yesterday. He was alert, verbally responsive and engaged in conversation w/ SLP; min distracted and HOH. Pt is on RA; afebrile. Denture partial in place.  Pt explained general aspiration precautions and agreed verbally to the need for following them especially sitting upright for all oral intake -- supported behind the back for full upright sitting. Pt assisted w/ positioning d/t min weakness. He fed himself po trials of purees and soft solids and thin liquids via cup. No overt clinical s/s of aspiration were noted w/ any consistency; respiratory status remained calm and unlabored, vocal quality clear b/t trials. Oral phase appeared [facility] for bolus management and timely A-P transfer for swallowing; oral clearing achieved w/ all consistencies. NSG denied any deficits in swallowing as well.   Pt appears at reduced risk for aspriation when following general aspiration precautions during po intake.  Recommend continue a fairly Regular/mech soft diet for ease of soft foods w/ gravies added to moisten foods; Thin liquids. Recommend general aspiration precautions; Pills Whole in Puree; tray setup and positioning assistance for meals. REFLUX precautions d/t pt's baseline. ST services will sign off at this time w/ MD to reconsult if needed while admitted. NSG updated. Precautions posted at bedside, chart.      HPI HPI: Pt is a 89 y.o. male with medical history significant for Severe PAD s/p multiple revascularization most recently 01/22/2024 for critical limb ischemia, and on chronic anticoagulation with Eliquis, systolic CHF secondary to ischemic cardiomyopathy, EF 35  to 40% 08/2023, CAD s/p CABG times 03/16/1998, HTN, GERD, HOH, who admitted with a small bowel obstruction, after presenting with Nausea and Vomiting w/ lower abdominal pain that started around noon on 3/17. Pt has now been hospitalized for 13 days today.   CT of the Abdomen/pelvis on 03/20/24 revealed several abdomen/pelvic issuse and moderate bilateral pleural effusions, new on the left and  increased on the right w/ compressive collapse or consolidation in both lower lobes.  No Head imaging.   OF NOTE: No previous chest Imaging prior to this admit for 1+ years.      SLP Plan  All goals met      Recommendations for follow up therapy are one component of a multi-disciplinary discharge planning process, led by the attending physician.  Recommendations may be updated based on patient status, additional functional criteria and insurance authorization.    Recommendations  Diet recommendations: Dysphagia 3 (mechanical soft);Thin liquid Liquids provided via: Cup Medication Administration: Whole meds with puree Supervision: Patient able to self feed;Intermittent supervision to cue for compensatory strategies (setup) Compensations: Minimize environmental distractions;Slow rate;Small sips/bites;Lingual sweep for clearance of pocketing;Follow solids with liquid (partial) Postural Changes and/or Swallow Maneuvers: Out of bed for meals;Seated upright 90 degrees;Upright 30-60 min after meal                 (Dietician) Oral care BID;Oral care before and after PO;Staff/trained caregiver to provide oral care (partial care)   Intermittent Supervision/Assistance Dysphagia, unspecified (R13.10) (in setting of Advanced Age; Comorbidities)     All goals met      (Late entry) Jerilynn Som, MS, CCC-SLP Speech Language Pathologist Rehab Services; [facility] [place] [phone] (ascom) Rae Plotner  04/05/2024, 7:40 AM

## 2024-04-06 ENCOUNTER — Telehealth: Payer: Self-pay | Admitting: Internal Medicine

## 2024-04-06 ENCOUNTER — Other Ambulatory Visit: Payer: Self-pay | Admitting: Internal Medicine

## 2024-04-06 NOTE — Telephone Encounter (Signed)
 Patients daughter called and asked for her fathers appointments to be canceled. She said he is now under hospice care.

## 2024-04-07 DIAGNOSIS — I5020 Unspecified systolic (congestive) heart failure: Secondary | ICD-10-CM | POA: Diagnosis not present

## 2024-04-07 DIAGNOSIS — K219 Gastro-esophageal reflux disease without esophagitis: Secondary | ICD-10-CM | POA: Diagnosis not present

## 2024-04-07 DIAGNOSIS — I10 Essential (primary) hypertension: Secondary | ICD-10-CM | POA: Diagnosis not present

## 2024-04-07 DIAGNOSIS — J9611 Chronic respiratory failure with hypoxia: Secondary | ICD-10-CM | POA: Diagnosis not present

## 2024-04-07 DIAGNOSIS — I2510 Atherosclerotic heart disease of native coronary artery without angina pectoris: Secondary | ICD-10-CM | POA: Diagnosis not present

## 2024-04-07 DIAGNOSIS — D500 Iron deficiency anemia secondary to blood loss (chronic): Secondary | ICD-10-CM | POA: Diagnosis not present

## 2024-04-07 DIAGNOSIS — I482 Chronic atrial fibrillation, unspecified: Secondary | ICD-10-CM | POA: Diagnosis not present

## 2024-04-12 ENCOUNTER — Encounter: Admitting: General Surgery

## 2024-04-15 ENCOUNTER — Other Ambulatory Visit: Payer: Self-pay

## 2024-04-15 ENCOUNTER — Emergency Department
Admission: EM | Admit: 2024-04-15 | Discharge: 2024-04-15 | Disposition: A | Discharge status: Home / Self Care | Attending: Emergency Medicine | Admitting: Emergency Medicine

## 2024-04-15 DIAGNOSIS — E119 Type 2 diabetes mellitus without complications: Secondary | ICD-10-CM | POA: Insufficient documentation

## 2024-04-15 DIAGNOSIS — T148XXA Other injury of unspecified body region, initial encounter: Secondary | ICD-10-CM

## 2024-04-15 DIAGNOSIS — S1190XA Unspecified open wound of unspecified part of neck, initial encounter: Secondary | ICD-10-CM | POA: Insufficient documentation

## 2024-04-15 DIAGNOSIS — I509 Heart failure, unspecified: Secondary | ICD-10-CM | POA: Diagnosis not present

## 2024-04-15 DIAGNOSIS — I2510 Atherosclerotic heart disease of native coronary artery without angina pectoris: Secondary | ICD-10-CM | POA: Insufficient documentation

## 2024-04-15 DIAGNOSIS — I110 Hypertensive heart disease with heart failure: Secondary | ICD-10-CM | POA: Insufficient documentation

## 2024-04-15 DIAGNOSIS — R404 Transient alteration of awareness: Secondary | ICD-10-CM | POA: Diagnosis not present

## 2024-04-15 DIAGNOSIS — L7622 Postprocedural hemorrhage and hematoma of skin and subcutaneous tissue following other procedure: Secondary | ICD-10-CM | POA: Insufficient documentation

## 2024-04-15 DIAGNOSIS — S1193XA Puncture wound without foreign body of unspecified part of neck, initial encounter: Secondary | ICD-10-CM | POA: Diagnosis not present

## 2024-04-15 DIAGNOSIS — I499 Cardiac arrhythmia, unspecified: Secondary | ICD-10-CM | POA: Diagnosis not present

## 2024-04-15 DIAGNOSIS — X58XXXA Exposure to other specified factors, initial encounter: Secondary | ICD-10-CM | POA: Insufficient documentation

## 2024-04-15 DIAGNOSIS — Z743 Need for continuous supervision: Secondary | ICD-10-CM | POA: Diagnosis not present

## 2024-04-15 LAB — BASIC METABOLIC PANEL WITH GFR
Anion gap: 6 (ref 5–15)
BUN: 23 mg/dL (ref 8–23)
CO2: 26 mmol/L (ref 22–32)
Calcium: 8.2 mg/dL — ABNORMAL LOW (ref 8.9–10.3)
Chloride: 100 mmol/L (ref 98–111)
Creatinine, Ser: 0.64 mg/dL (ref 0.61–1.24)
GFR, Estimated: >60 mL/min (ref >60)
Glucose, Bld: 90 mg/dL (ref 70–99)
Potassium: 4 mmol/L (ref 3.5–5.1)
Sodium: 132 mmol/L — ABNORMAL LOW (ref 135–145)

## 2024-04-15 LAB — CBC
HCT: 34.4 % — ABNORMAL LOW (ref 39.0–52.0)
Hemoglobin: 10.7 g/dL — ABNORMAL LOW (ref 13.0–17.0)
MCH: 31.1 pg (ref 26.0–34.0)
MCHC: 31.1 g/dL (ref 30.0–36.0)
MCV: 100 fL (ref 80.0–100.0)
Platelets: 270 10*3/uL (ref 150–400)
RBC: 3.44 MIL/uL — ABNORMAL LOW (ref 4.22–5.81)
RDW: 22.8 % — ABNORMAL HIGH (ref 11.5–15.5)
WBC: 11.8 10*3/uL — ABNORMAL HIGH (ref 4.0–10.5)
nRBC: 0 % (ref 0.0–0.2)

## 2024-04-15 NOTE — ED Notes (Signed)
 Called [facility] for transport to [place] [facility] [phone]

## 2024-04-15 NOTE — ED Triage Notes (Addendum)
 Pt arrives via EMS from [facility] for bleeding at neck -on arrival pt is A/ox2, NAD, MAE, breathing even and unlabored. Bandage noted to the right neck where a moderate amount of blood and bloody clots noted, bleeding controlled at this time. Pt had removal of skin cancer from face 1.5 weeks ago - pt is +bloodthinners however has been off since the skin cancer removal.

## 2024-04-15 NOTE — Progress Notes (Signed)
 [facility] Hospitalized Hospice Patient  [name] [name] is a current hospice patient followed at [facility] with terminal diagnosis of Systolic Congestive Heart Failure.  Our after hours team was notified by [facility] Staff that patient was being sent to [facility] via EMS for bleeding from his skin cancer site.  Please do not hesitate to call with any hospice related questions or concerns.  Saddie HILARIO Na, RN Nurse Liaison [phone]

## 2024-04-15 NOTE — ED Provider Notes (Signed)
 [facility] Provider Note    Event Date/Time   First MD Initiated Contact with Patient 04/15/24 0701     (approximate)   History   Chief Complaint Laceration   HPI  [name] [name] is a 89 y.o. male with past medical history of hypertension, diabetes, CAD, CHF, and atrial fibrillation who presents to the ED complaining of bleeding wound.  Per EMS, patient noted to have bleeding from wound at the right side of his neck by staff at his nursing facility.  Moderate amount of blood and clots noted around dressing placed at the facility but EMS reports no ongoing bleeding.  They state that patient had skin cancer removed from this location about a week and a half ago, was previously taking a blood thinner but is not currently taking it following the procedure.  Patient denies any complaints on my assessment.     Physical Exam   Triage Vital Signs: ED Triage Vitals  Encounter Vitals Group     BP 04/15/24 0645 110/66     Systolic BP Percentile --      Diastolic BP Percentile --      Pulse Rate 04/15/24 0645 84     Resp --      Temp 04/15/24 0645 98 F (36.7 C)     Temp Source 04/15/24 0645 Oral     SpO2 04/15/24 0637 96 %     Weight 04/15/24 0647 137 lb (62.1 kg)     Height 04/15/24 0647 5' 7 (1.702 m)     Head Circumference --      Peak Flow --      Pain Score 04/15/24 0647 0     Pain Loc --      Pain Education --      Exclude from Growth Chart --     Most recent vital signs: Vitals:   04/15/24 0637 04/15/24 0645  BP:  110/66  Pulse:  84  Temp:  98 F (36.7 C)  SpO2: 96% 95%    Constitutional: Awake and alert. Eyes: Conjunctivae are normal. Head: Atraumatic. Nose: No congestion/rhinnorhea. Mouth/Throat: Mucous membranes are moist.  Neck: Ulceration noted near the angle of the mandible with dried blood but no active bleeding noted. Cardiovascular: Normal rate, regular rhythm. Grossly normal heart sounds.  2+ radial pulses  bilaterally. Respiratory: Normal respiratory effort.  No retractions. Lungs CTAB. Gastrointestinal: Soft and nontender. No distention. Musculoskeletal: No lower extremity tenderness nor edema.  Neurologic:  Normal speech and language.    ED Results / Procedures / Treatments   Labs (all labs ordered are listed, but only abnormal results are displayed) Labs Reviewed  CBC - Abnormal; Notable for the following components:      Result Value   WBC 11.8 (*)    RBC 3.44 (*)    Hemoglobin 10.7 (*)    HCT 34.4 (*)    RDW 22.8 (*)    All other components within normal limits  BASIC METABOLIC PANEL WITH GFR - Abnormal; Notable for the following components:   Sodium 132 (*)    Calcium  8.2 (*)    All other components within normal limits    PROCEDURES:  Critical Care performed: No  Procedures   MEDICATIONS ORDERED IN ED: Medications - No data to display   IMPRESSION / MDM / ASSESSMENT AND PLAN / ED COURSE  I reviewed the triage vital signs and the nursing notes.  89 y.o. male with past medical history of hypertension, diabetes, CAD, CHF, and atrial fibrillation who presents to the ED complaining of bleeding from wound to the right side of his neck.  Patient's presentation is most consistent with acute presentation with potential threat to life or bodily function.  Differential diagnosis includes, but is not limited to, anemia, bleeding wound, malignancy, infection.  Patient chronically ill but nontoxic-appearing and in no acute distress, vital signs are unremarkable.  While he has a significant amount of dried blood to the area, bleeding from wound seems to have stopped.  Area was cleaned by nursing staff and I replaced dressing with Surgicel over the area of prior bleeding and an overlying gauze wrap.  Patient denies any complaints at this time, labs without significant anemia, leukocytosis, electrolyte abnormality, or AKI.  I did discuss findings with  daughter over the phone, patient currently under hospice care and is appropriate for discharge back to his nursing facility.  Daughter agrees with plan.      FINAL CLINICAL IMPRESSION(S) / ED DIAGNOSES   Final diagnoses:  Open wound of neck, initial encounter  Bleeding from wound     Rx / DC Orders   ED Discharge Orders     None        Note:  This document was prepared using Dragon voice recognition software and may include unintentional dictation errors.   Willo Dunnings, MD 04/15/24 [phone]

## 2024-04-15 NOTE — TOC Initial Note (Signed)
 Transition of Care [facility]) - Initial/Assessment Note    Patient Details  Name: [name] [name] MRN: [id number] Date of Birth: 1929-06-18  [facility] CM/SW Contact:    Meda Dudzinski E Cambelle Suchecki, LCSW Phone Number: 04/15/2024, 9:55 AM  Clinical Narrative:                 No family at bedside. CSW spoke with RN who states they have confirmed with family the plan for DC back to [facility] - ED Secretary has arranged transport. CSW called Sarah at [facility] who confirms patient can return - no FL2 needed since patient was not admitted. Sarah requests AVS be emailed to her by encrypted email - CSW will send.  No additional TOC needs identified.  Expected Discharge Plan: Assisted Living Barriers to Discharge: Barriers Resolved   Patient Goals and CMS Choice            Expected Discharge Plan and Services       Living arrangements for the past 2 months: Assisted Living Facility                                      Prior Living Arrangements/Services Living arrangements for the past 2 months: Assisted Living Facility Lives with:: Facility Resident                   Activities of Daily Living      Permission Sought/Granted                  Emotional Assessment              Admission diagnosis:  neck lac Patient Active Problem List   Diagnosis Date Noted   Nontraumatic retroperitoneal hematoma 03/21/2024   ABLA (acute blood loss anemia) 03/21/2024   Hemorrhagic shock (HCC) 03/21/2024   Protein-calorie malnutrition, severe 03/17/2024   Small bowel obstruction (HCC) 03/15/2024   PVD (peripheral vascular disease) (HCC) 03/15/2024   Ischemic cardiomyopathy 03/15/2024   Chronic anticoagulation 03/15/2024   Lactic acidosis 03/15/2024   History of revascularization procedure of lower extremity 03/15/2024   Recurrent squamous cell carcinoma in situ (SCCIS) of skin of cheek 02/09/2024   Symptomatic anemia 02/09/2024   Critical limb ischemia of  left lower extremity (HCC) 01/21/2024   Pressure injury of skin of dorsum of left foot 09/27/2023   Fall 09/26/2023   HFrEF (heart failure with reduced ejection fraction) (HCC) 09/26/2023   Syncope 09/24/2023   Chronic heart failure with reduced ejection fraction (HFrEF, <= 40%) (HCC) 09/04/2021   Atherosclerosis of native arteries of the extremities with ulceration (HCC) 09/04/2021   Atherosclerosis of artery of extremity with rest pain (HCC) 08/30/2021   Atherosclerosis of native arteries of extremity with intermittent claudication (HCC) 02/18/2021   Cataract cortical, senile 02/11/2021   Cor pulmonale, acute (HCC) 05/02/2020   Type 2 diabetes mellitus with peripheral angiopathy (HCC) 11/10/2018   Medicare annual wellness visit, initial 05/05/2017   Varicose veins of lower extremities with ulcer (HCC) 04/23/2017   Chronic venous insufficiency 04/23/2017   Leg pain 04/23/2017   Lymphedema 04/23/2017   A-fib (HCC) 04/23/2017   Hyperlipidemia 04/23/2017   Acquired hypothyroidism 11/03/2016   B12 deficiency 10/27/2014   CAD with history of CABG x3 (coronary artery disease) 04/25/2014   GERD (gastroesophageal reflux disease) 04/25/2014   HTN (hypertension), benign 04/25/2014   S/P CABG x 3 02/26/1993   PCP:  Sari Cunning, MD Pharmacy:   [facility] [place], [place] - [phone] [address] [place] [address] Phone: [phone] Fax: [phone]     Social Drivers of Health (SDOH) Social History: SDOH Screenings   Food Insecurity: No Food Insecurity (03/15/2024)  Housing: Low Risk  (03/15/2024)  Transportation Needs: No Transportation Needs (03/15/2024)  Utilities: Not At Risk (03/15/2024)  Depression (PHQ2-9): Low Risk  (02/09/2024)  Financial Resource Strain: Low Risk  (09/22/2023)   [facility]  Social Connections: Moderately Isolated (03/15/2024)  Tobacco Use: Low Risk  (03/15/2024)   SDOH  Interventions:     Readmission Risk Interventions     No data to display

## 2024-04-18 ENCOUNTER — Ambulatory Visit

## 2024-04-18 ENCOUNTER — Ambulatory Visit: Admitting: Internal Medicine

## 2024-04-18 ENCOUNTER — Other Ambulatory Visit

## 2024-05-17 ENCOUNTER — Encounter (INDEPENDENT_AMBULATORY_CARE_PROVIDER_SITE_OTHER): Payer: Self-pay

## 2024-05-29 DEATH — deceased
